# Patient Record
Sex: Male | Born: 1950 | Race: White | Hispanic: No | Marital: Married | Smoking: Never smoker
Health system: Southern US, Community
[De-identification: ages and names within clinical notes are randomized; demographics above are authoritative.]

## PROBLEM LIST (undated history)

## (undated) DIAGNOSIS — F410 Panic disorder [episodic paroxysmal anxiety] without agoraphobia: Secondary | ICD-10-CM

## (undated) DIAGNOSIS — Z950 Presence of cardiac pacemaker: Secondary | ICD-10-CM

## (undated) DIAGNOSIS — Z8719 Personal history of other diseases of the digestive system: Secondary | ICD-10-CM

## (undated) DIAGNOSIS — F329 Major depressive disorder, single episode, unspecified: Secondary | ICD-10-CM

## (undated) DIAGNOSIS — N393 Stress incontinence (female) (male): Secondary | ICD-10-CM

## (undated) DIAGNOSIS — C539 Malignant neoplasm of cervix uteri, unspecified: Secondary | ICD-10-CM

## (undated) DIAGNOSIS — I493 Ventricular premature depolarization: Secondary | ICD-10-CM

## (undated) DIAGNOSIS — I2510 Atherosclerotic heart disease of native coronary artery without angina pectoris: Secondary | ICD-10-CM

## (undated) DIAGNOSIS — I77810 Thoracic aortic ectasia: Secondary | ICD-10-CM

## (undated) DIAGNOSIS — J189 Pneumonia, unspecified organism: Secondary | ICD-10-CM

## (undated) DIAGNOSIS — E785 Hyperlipidemia, unspecified: Secondary | ICD-10-CM

## (undated) DIAGNOSIS — M1990 Unspecified osteoarthritis, unspecified site: Secondary | ICD-10-CM

## (undated) DIAGNOSIS — N529 Male erectile dysfunction, unspecified: Secondary | ICD-10-CM

## (undated) DIAGNOSIS — J309 Allergic rhinitis, unspecified: Secondary | ICD-10-CM

## (undated) DIAGNOSIS — Z9109 Other allergy status, other than to drugs and biological substances: Secondary | ICD-10-CM

## (undated) DIAGNOSIS — K219 Gastro-esophageal reflux disease without esophagitis: Secondary | ICD-10-CM

## (undated) DIAGNOSIS — K760 Fatty (change of) liver, not elsewhere classified: Secondary | ICD-10-CM

## (undated) DIAGNOSIS — I471 Supraventricular tachycardia: Secondary | ICD-10-CM

## (undated) DIAGNOSIS — N1330 Unspecified hydronephrosis: Secondary | ICD-10-CM

## (undated) DIAGNOSIS — C61 Malignant neoplasm of prostate: Secondary | ICD-10-CM

## (undated) DIAGNOSIS — I480 Paroxysmal atrial fibrillation: Secondary | ICD-10-CM

## (undated) DIAGNOSIS — I5032 Chronic diastolic (congestive) heart failure: Secondary | ICD-10-CM

## (undated) DIAGNOSIS — I10 Essential (primary) hypertension: Secondary | ICD-10-CM

## (undated) DIAGNOSIS — I4719 Other supraventricular tachycardia: Secondary | ICD-10-CM

## (undated) DIAGNOSIS — Z9889 Other specified postprocedural states: Secondary | ICD-10-CM

## (undated) DIAGNOSIS — F429 Obsessive-compulsive disorder, unspecified: Secondary | ICD-10-CM

## (undated) DIAGNOSIS — F32A Depression, unspecified: Secondary | ICD-10-CM

## (undated) DIAGNOSIS — E039 Hypothyroidism, unspecified: Secondary | ICD-10-CM

## (undated) DIAGNOSIS — R112 Nausea with vomiting, unspecified: Secondary | ICD-10-CM

## (undated) DIAGNOSIS — R001 Bradycardia, unspecified: Secondary | ICD-10-CM

## (undated) DIAGNOSIS — I700 Atherosclerosis of aorta: Secondary | ICD-10-CM

## (undated) HISTORY — DX: Paroxysmal atrial fibrillation: I48.0

## (undated) HISTORY — DX: Supraventricular tachycardia: I47.1

## (undated) HISTORY — PX: UPPER GI ENDOSCOPY: SHX6162

## (undated) HISTORY — DX: Depression, unspecified: F32.A

## (undated) HISTORY — DX: Chronic diastolic (congestive) heart failure: I50.32

## (undated) HISTORY — DX: Male erectile dysfunction, unspecified: N52.9

## (undated) HISTORY — DX: Thoracic aortic ectasia: I77.810

## (undated) HISTORY — DX: Presence of cardiac pacemaker: Z95.0

## (undated) HISTORY — DX: Atherosclerosis of aorta: I70.0

## (undated) HISTORY — DX: Bradycardia, unspecified: R00.1

## (undated) HISTORY — DX: Malignant neoplasm of prostate: C61

## (undated) HISTORY — DX: Panic disorder (episodic paroxysmal anxiety): F41.0

## (undated) HISTORY — DX: Allergic rhinitis, unspecified: J30.9

## (undated) HISTORY — DX: Ventricular premature depolarization: I49.3

## (undated) HISTORY — DX: Hypothyroidism, unspecified: E03.9

## (undated) HISTORY — DX: Gastro-esophageal reflux disease without esophagitis: K21.9

## (undated) HISTORY — DX: Major depressive disorder, single episode, unspecified: F32.9

## (undated) HISTORY — DX: Other supraventricular tachycardia: I47.19

---
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
## 1974-10-30 HISTORY — PX: THYROIDECTOMY, PARTIAL: SHX18

## 2002-05-31 HISTORY — PX: REFRACTIVE SURGERY: SHX103

## 2003-07-05 ENCOUNTER — Ambulatory Visit (HOSPITAL_COMMUNITY): Admission: RE | Admit: 2003-07-05 | Discharge: 2003-07-05 | Payer: Self-pay | Admitting: Gastroenterology

## 2004-02-05 ENCOUNTER — Emergency Department (HOSPITAL_COMMUNITY): Admission: EM | Admit: 2004-02-05 | Discharge: 2004-02-05 | Payer: Self-pay | Admitting: Emergency Medicine

## 2008-02-25 ENCOUNTER — Ambulatory Visit (HOSPITAL_COMMUNITY): Admission: EM | Admit: 2008-02-25 | Discharge: 2008-02-26 | Payer: Self-pay | Admitting: Emergency Medicine

## 2010-05-31 HISTORY — PX: ROBOT ASSISTED LAPAROSCOPIC RADICAL PROSTATECTOMY: SHX5141

## 2010-06-08 ENCOUNTER — Inpatient Hospital Stay (HOSPITAL_COMMUNITY)
Admission: RE | Admit: 2010-06-08 | Discharge: 2010-06-09 | Payer: Self-pay | Source: Home / Self Care | Attending: Urology | Admitting: Urology

## 2010-06-15 LAB — HEMOGLOBIN AND HEMATOCRIT, BLOOD
HCT: 42.7 % (ref 39.0–52.0)
HCT: 35.9 % — ABNORMAL LOW (ref 39.0–52.0)
Hemoglobin: 14.4 g/dL (ref 13.0–17.0)
Hemoglobin: 12.2 g/dL — ABNORMAL LOW (ref 13.0–17.0)

## 2010-06-15 LAB — TYPE AND SCREEN
ABO/RH(D): O NEG
Antibody Screen: NEGATIVE

## 2010-06-15 LAB — ABO/RH: ABO/RH(D): O NEG

## 2010-06-25 ENCOUNTER — Ambulatory Visit (HOSPITAL_COMMUNITY)
Admission: RE | Admit: 2010-06-25 | Discharge: 2010-06-25 | Payer: Self-pay | Source: Home / Self Care | Attending: Urology | Admitting: Urology

## 2010-06-29 ENCOUNTER — Ambulatory Visit
Admission: RE | Admit: 2010-06-29 | Discharge: 2010-06-30 | Payer: Self-pay | Source: Home / Self Care | Attending: Radiation Oncology | Admitting: Radiation Oncology

## 2010-07-01 ENCOUNTER — Ambulatory Visit: Payer: BC Managed Care – PPO | Admitting: Radiation Oncology

## 2010-08-10 LAB — CBC
HCT: 42.8 % (ref 39.0–52.0)
Hemoglobin: 14.3 g/dL (ref 13.0–17.0)
MCH: 30.4 pg (ref 26.0–34.0)
MCHC: 33.4 g/dL (ref 30.0–36.0)
MCV: 90.9 fL (ref 78.0–100.0)
Platelets: 212 10*3/uL (ref 150–400)
RBC: 4.71 MIL/uL (ref 4.22–5.81)
RDW: 13.6 % (ref 11.5–15.5)
WBC: 6.7 10*3/uL (ref 4.0–10.5)

## 2010-08-10 LAB — BASIC METABOLIC PANEL
BUN: 13 mg/dL (ref 6–23)
CO2: 27 mEq/L (ref 19–32)
Calcium: 9.2 mg/dL (ref 8.4–10.5)
Chloride: 103 mEq/L (ref 96–112)
Creatinine, Ser: 1.1 mg/dL (ref 0.4–1.5)
GFR calc Af Amer: >60 mL/min (ref >60)
GFR calc non Af Amer: >60 mL/min (ref >60)
Glucose, Bld: 92 mg/dL (ref 70–99)
Potassium: 4.6 mEq/L (ref 3.5–5.1)
Sodium: 140 mEq/L (ref 135–145)

## 2010-08-10 LAB — SURGICAL PCR SCREEN
MRSA, PCR: NEGATIVE
Staphylococcus aureus: NEGATIVE

## 2010-08-25 ENCOUNTER — Ambulatory Visit: Payer: BC Managed Care – PPO | Attending: Radiation Oncology | Admitting: Radiation Oncology

## 2010-08-25 DIAGNOSIS — Z510 Encounter for antineoplastic radiation therapy: Secondary | ICD-10-CM | POA: Insufficient documentation

## 2010-08-25 DIAGNOSIS — C61 Malignant neoplasm of prostate: Secondary | ICD-10-CM | POA: Insufficient documentation

## 2010-10-13 NOTE — Consult Note (Signed)
Blake Carter, Blake Carter            ACCOUNT NO.:  [id number]   MEDICAL RECORD NO.:  [id number]          PATIENT TYPE:  EMS   LOCATION:  ED                           FACILITY:  [facility]   PHYSICIAN:  James L. Malon Kindle., M.D.DATE OF BIRTH:  March 14, 1951   DATE OF CONSULTATION:  02/25/2008  DATE OF DISCHARGE:  02/26/2008                                 CONSULTATION   PRIMARY CARE PHYSICIAN:  Dr. Catha Gosselin.   REASON FOR CONSULTATION:  Food impaction.   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  Had eaten some chicken at 1 o'clock and has been unable to  swallow since.  He failed to respond to glucagon in the emergency room,  and we were asked to see him.  He does have a history of heartburn, is  taking a large number of p.r.n. antacids with some relief.  He has been  unable to swallow saliva and spitting up foamy saliva since 1 o'clock.   CURRENT MEDICATIONS:  Lipitor, gemfibrozil, Allegra, Lovenox,  lisinopril, aspirin.   ALLERGIES:  TAGAMET.   MEDICAL HISTORY:  The patient had a colonoscopy 5 years ago he notes was  okay.  He has a history of OCD.  No heart or lung disease.   SOCIAL HISTORY:  He does not smoke, uses alcohol occasionally.   PHYSICAL EXAMINATION:  VITAL SIGNS:  Unremarkable.  GENERAL:  Alert, quite talkative white male in no acute distress.  He is  quite muscular.  HEART:  Regular rate and rhythm without murmurs or gallops.  LUNGS:  Clear.  Throat is normal.  He does have a level III airway, I  can barely see his uvula.   ASSESSMENT:  Food impaction by history.   PLAN:  Proceed with an endoscopy and removal of a food impaction.  We  discussed the risk of aspiration, perforation, possible need to do to  surgery to have this removed.  If we fail, will remove it  endoscopically.  He asked questions, and these were answered.           ______________________________  Llana Aliment Malon Kindle., M.D.     Waldron Session  D:   02/25/2008  T:  02/26/2008  Job:  [id number]   cc:   Caryn Bee L. Little, M.D.  Fax: [phone]

## 2010-10-13 NOTE — Op Note (Signed)
Blake Carter, Blake Carter            ACCOUNT NO.:  [id number]   MEDICAL RECORD NO.:  [id number]          PATIENT TYPE:  EMS   LOCATION:  ED                           FACILITY:  [facility]   PHYSICIAN:  James L. Malon Kindle., M.D.DATE OF BIRTH:  11-17-1950   DATE OF PROCEDURE:  02/25/2008  DATE OF DISCHARGE:                               OPERATIVE REPORT   PROCEDURE:  Esophagogastroduodenoscopy with removal of food impaction.   MEDICATIONS:  Cetacaine spray, fentanyl 100 mcg, Versed 10 mg IV.   INDICATIONS:  Patient with food impaction with meat, specifically  chicken since 1 o'clock.   DESCRIPTION OF PROCEDURE:  The procedure had been explained to the  patient including the risks and benefits and consent obtained.  In the  left lateral decubitus position, the patient had a great deal of trouble  swallowing the scope.  We tried both direct and indirectly and we were  eventually able to pass it indirectly.  He had a large amount of gagging  and retching.  He had a large bolus of meat in the distal esophagus.  We  tried to get a Lucina Mellow net around it, were unsuccessful in doing, broke  into pieces.  We tried the tripod, we passed the scope approximately 7,  8 or 10 times pulling off pieces and breaking it up.  Finally we broke  it up and the remainder popped through into the stomach.  Complete  endoscopy was performed and the large piece of meat was seen in the  stomach.  The duodenum was normal as was the pyloric channel, antrum and  body of the stomach.  Fundus and cardia showed a hiatal hernia, showed a  stricture in the distal esophagus, was quite friable and was somewhat  bloody after all the instrumentation.  There were no obvious deep ulcers  or  tears.  The scope was withdrawn.  The esophagus was otherwise normal  and very few small pieces were seen in the esophagus.  The scope was  withdrawn.  The patient was alert and oriented and had somewhat of a  sore throat but no other significant  complaints.   ASSESSMENT:  1. Food impaction eventually removed.  2. Esophageal stricture.   PLAN:  Will start the patient on Nexium, will see him back in the office  in next several weeks.  Will keep him on clear liquids and ice chips  tonight.  He will call us if he has any further problems.           ______________________________  Llana Aliment Malon Kindle., M.D.     Waldron Session  D:  02/25/2008  T:  02/26/2008  Job:  [id number]   cc:   Caryn Bee L. Little, M.D.  Fax: [phone]

## 2010-10-16 NOTE — Op Note (Signed)
Blake Carter, Blake Carter                        ACCOUNT NO.:  [id number]   MEDICAL RECORD NO.:  [id number]                   PATIENT TYPE:  AMB   LOCATION:  XRAY                                 FACILITY:  [facility]   PHYSICIAN:  Graylin Shiver, M.D.                DATE OF BIRTH:  Nov 12, 1950   DATE OF PROCEDURE:  07/05/2003  DATE OF DISCHARGE:                                 OPERATIVE REPORT   PROCEDURE:  Colonoscopy.   INDICATIONS FOR PROCEDURE:  Screening.   Informed consent was obtained after explanation of the risks of bleeding,  infection and perforation.   PREMEDICATION:  Fentanyl 50 mcg IV, Versed 6 mg IV.   DESCRIPTION OF PROCEDURE:  With the patient in the left lateral decubitus  position, a rectal exam was performed and no masses were felt. The Olympus  colonoscope was then inserted into the rectum and advanced around the colon  to the cecum. Cecal landmarks were identified. The ileocecal valve was  intubated and the first few centimeters of terminal ileum looked normal.  The cecum was normal, the ascending colon were normal. The transverse colon  was normal.  The descending colon was normal. The sigmoid showed a few small  diverticula. The rectum was normal.  He tolerated the procedure well without  complications.   IMPRESSION:  A few diverticula in the sigmoid region otherwise normal  colonoscopy to the cecum.   I would recommend a followup screening colonoscopy again in 10 years.                                               Graylin Shiver, M.D.    [name]  D:  07/05/2003  T:  07/05/2003  Job:  [id number]   cc:   Caryn Bee L. Little, M.D.  [address]  [place]  [place] [address]  Fax: [phone]

## 2010-11-23 ENCOUNTER — Ambulatory Visit
Admission: RE | Admit: 2010-11-23 | Discharge: 2010-11-23 | Disposition: A | Discharge status: Home / Self Care | Payer: BC Managed Care – PPO | Source: Ambulatory Visit | Attending: Radiation Oncology | Admitting: Radiation Oncology

## 2010-11-23 DIAGNOSIS — C61 Malignant neoplasm of prostate: Secondary | ICD-10-CM | POA: Insufficient documentation

## 2010-11-23 DIAGNOSIS — Z510 Encounter for antineoplastic radiation therapy: Secondary | ICD-10-CM | POA: Insufficient documentation

## 2010-12-30 ENCOUNTER — Ambulatory Visit
Admission: RE | Admit: 2010-12-30 | Discharge: 2010-12-30 | Disposition: A | Discharge status: Home / Self Care | Payer: BC Managed Care – PPO | Source: Ambulatory Visit | Attending: Radiation Oncology | Admitting: Radiation Oncology

## 2011-10-08 ENCOUNTER — Other Ambulatory Visit: Payer: Self-pay | Admitting: Urology

## 2011-11-23 ENCOUNTER — Encounter (HOSPITAL_COMMUNITY): Payer: Self-pay

## 2011-11-23 ENCOUNTER — Encounter (HOSPITAL_COMMUNITY): Payer: Self-pay | Admitting: Pharmacy Technician

## 2011-11-23 ENCOUNTER — Encounter (HOSPITAL_COMMUNITY)
Admission: RE | Admit: 2011-11-23 | Discharge: 2011-11-23 | Disposition: A | Discharge status: Home / Self Care | Payer: BC Managed Care – PPO | Source: Ambulatory Visit | Attending: Urology | Admitting: Urology

## 2011-11-23 HISTORY — DX: Stress incontinence (female) (male): N39.3

## 2011-11-23 HISTORY — DX: Other specified postprocedural states: Z98.890

## 2011-11-23 HISTORY — DX: Obsessive-compulsive disorder, unspecified: F42.9

## 2011-11-23 HISTORY — DX: Other allergy status, other than to drugs and biological substances: Z91.09

## 2011-11-23 HISTORY — DX: Essential (primary) hypertension: I10

## 2011-11-23 HISTORY — DX: Hyperlipidemia, unspecified: E78.5

## 2011-11-23 HISTORY — DX: Other specified postprocedural states: R11.2

## 2011-11-23 LAB — CBC
HCT: 38.5 % — ABNORMAL LOW (ref 39.0–52.0)
Hemoglobin: 12.5 g/dL — ABNORMAL LOW (ref 13.0–17.0)
MCH: 28.7 pg (ref 26.0–34.0)
MCHC: 32.5 g/dL (ref 30.0–36.0)
MCV: 88.5 fL (ref 78.0–100.0)
Platelets: 234 10*3/uL (ref 150–400)
RBC: 4.35 MIL/uL (ref 4.22–5.81)
RDW: 14 % (ref 11.5–15.5)
WBC: 5 10*3/uL (ref 4.0–10.5)

## 2011-11-23 LAB — BASIC METABOLIC PANEL
BUN: 20 mg/dL (ref 6–23)
CO2: 29 mEq/L (ref 19–32)
Calcium: 9.8 mg/dL (ref 8.4–10.5)
Chloride: 100 mEq/L (ref 96–112)
Creatinine, Ser: 0.86 mg/dL (ref 0.50–1.35)
GFR calc Af Amer: >90 mL/min (ref >90)
GFR calc non Af Amer: >90 mL/min (ref >90)
Glucose, Bld: 124 mg/dL — ABNORMAL HIGH (ref 70–99)
Potassium: 4.6 mEq/L (ref 3.5–5.1)
Sodium: 138 mEq/L (ref 135–145)

## 2011-11-23 LAB — SURGICAL PCR SCREEN
MRSA, PCR: NEGATIVE
Staphylococcus aureus: NEGATIVE

## 2011-11-23 LAB — PROTIME-INR
INR: 0.91 (ref 0.00–1.49)
Prothrombin Time: 12.4 seconds (ref 11.6–15.2)

## 2011-11-23 LAB — APTT: aPTT: 28 seconds (ref 24–37)

## 2011-11-23 NOTE — Pre-Procedure Instructions (Signed)
PT HAS CXR REPORT FROM DR. K. LITTLE --DONE 11/11/11--REPORT IS ON PT'S CHART. CBC, BMET, PT, PTT AND EKG WERE DONE TODAY - PREOP - AT [facility] AS PER ORDERS DR. MACDIARMID AND ANESTHESIOLOGIST'S GUIDELINES. PREOP INSTRUCTIONS WERE DISCUSSED WITH PT USING TEACH BACK METHOD.

## 2011-11-23 NOTE — Patient Instructions (Signed)
YOUR SURGERY IS SCHEDULED ON:  Tuesday  7/2  AT 7:30 AM  REPORT TO [facility] AT:  5:30 AM      PHONE # FOR SHORT STAY IS [phone]  DO NOT EAT OR DRINK ANYTHING AFTER MIDNIGHT THE NIGHT BEFORE YOUR SURGERY.  YOU MAY BRUSH YOUR TEETH, RINSE OUT YOUR MOUTH--BUT NO WATER, NO FOOD, NO CHEWING GUM, NO MINTS, NO CANDIES, NO CHEWING TOBACCO.  PLEASE TAKE THE FOLLOWING MEDICATIONS THE AM OF YOUR SURGERY WITH A FEW SIPS OF WATER:  AMLODIPINE, LIPITOR, NEXIUM, FEXOFENADINE.  PLEASE USE YOUR EYE DROPS AND BRING YOUR DROPS TO THE HOSPITAL.    IF YOU USE INHALERS--USE YOUR INHALERS THE AM OF YOUR SURGERY AND BRING INHALERS TO THE HOSPITAL -TAKE TO SURGERY.    IF YOU ARE DIABETIC:  DO NOT TAKE ANY DIABETIC MEDICATIONS THE AM OF YOUR SURGERY.  IF YOU TAKE INSULIN IN THE EVENINGS--PLEASE ONLY TAKE 1/2 NORMAL EVENING DOSE THE NIGHT BEFORE YOUR SURGERY.  NO INSULIN THE AM OF YOUR SURGERY.  IF YOU HAVE SLEEP APNEA AND USE CPAP OR BIPAP--PLEASE BRING THE MASK --NOT THE MACHINE-NOT THE TUBING   -JUST THE MASK. DO NOT BRING VALUABLES, MONEY, CREDIT CARDS.  CONTACT LENS, DENTURES / PARTIALS, GLASSES SHOULD NOT BE WORN TO SURGERY AND IN MOST CASES-HEARING AIDS WILL NEED TO BE REMOVED.  BRING YOUR GLASSES CASE, ANY EQUIPMENT NEEDED FOR YOUR CONTACT LENS. FOR PATIENTS ADMITTED TO THE HOSPITAL--CHECK OUT TIME THE DAY OF DISCHARGE IS 11:00 AM.  ALL INPATIENT ROOMS ARE PRIVATE - WITH BATHROOM, TELEPHONE, TELEVISION AND WIFI INTERNET. IF YOU ARE BEING DISCHARGED THE SAME DAY OF YOUR SURGERY--YOU CAN NOT DRIVE YOURSELF HOME--AND SHOULD NOT GO HOME ALONE BY TAXI OR BUS.  NO DRIVING OR OPERATING MACHINERY FOR 24 HOURS FOLLOWING ANESTHESIA / PAIN MEDICATIONS.                            SPECIAL INSTRUCTIONS:  CHLORHEXIDINE SOAP SHOWER (other brand names are Betasept and Hibiclens ) PLEASE SHOWER WITH CHLORHEXIDINE THE NIGHT BEFORE YOUR SURGERY AND THE AM OF YOUR SURGERY. DO NOT USE CHLORHEXIDINE ON YOUR  FACE OR PRIVATE AREAS--YOU MAY USE YOUR NORMAL SOAP THOSE AREAS AND YOUR NORMAL SHAMPOO.  WOMEN SHOULD AVOID SHAVING UNDER ARMS AND SHAVING LEGS 48 HOURS BEFORE USING CHLORHEXIDINE TO AVOID SKIN IRRITATION.  DO NOT USE IF ALLERGIC TO CHLORHEXIDINE.  PLEASE READ OVER ANY  FACT SHEETS THAT YOU WERE GIVEN: MRSA INFORMATION, BLOOD TRANSFUSION INFORMATION

## 2011-11-24 MED ORDER — MIDAZOLAM HCL 10 MG/2ML IJ SOLN
INTRAMUSCULAR | Status: AC
  Filled 2011-11-24: qty 2

## 2011-11-24 MED ORDER — FENTANYL CITRATE 0.05 MG/ML IJ SOLN
INTRAMUSCULAR | Status: AC
  Filled 2011-11-24: qty 2

## 2011-11-24 MED ORDER — DIPHENHYDRAMINE HCL 50 MG/ML IJ SOLN
INTRAMUSCULAR | Status: AC
  Filled 2011-11-24: qty 1

## 2011-11-29 MED ORDER — GENTAMICIN SULFATE 40 MG/ML IJ SOLN
440.0000 mg | INTRAVENOUS | Status: AC
  Administered 2011-11-30: 440 mg via INTRAVENOUS
  Filled 2011-11-29: qty 11

## 2011-11-29 NOTE — H&P (Signed)
History of Present Illness   [name] has ongoing stress incontinence. He has urge incontinence. 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 Blake Carter helped urgency but not his incontinence. VESIcare failed. He is here to talk about his sphincter.  Review of systems: No change in bowel or neurologic status.   He has stable prostate cancer followed by Dr. Laverle Carter.  Urinalysis: I reviewed, negative.   We talked about an artificial sphincter and I drew him a picture. I mentioned a male sling.  I drew him a picture and we talked about an artificial sphincter in detail. Pros, cons, general surgical and anesthetic risks, and other options including behavioral therapy, the male sling, and watchful waiting were discussed. He understands that sphincters are successful in 80-90% of cases for stress incontinence (dry in approximately half), 50% for urge incontinence, and that in a small percentage of cases the incontinence can worsen. Surgical risks were described but not limited to the discussion of injury to neighboring structures including the bowel (with possible life-threatening sepsis and colostomy), and bladder, urethra (all resulting in further surgery). The risk of urethral erosion and infection were discussed with sequelae. The risks of malfunction, atrophy, and hernia/skin erosion and management were discussed. Bleeding risks with transfusion rates and risks of perineal numbness and erectile dysfunction were discussed. The risks of urinary retention requiring catheterization and slowing of urinary stream were discussed. We talked about injury to nerves/soft tissue leading to debilitating and intractable pelvic, abdominal, and lower extremity pain syndromes and neuropathies. The usual post-operative course and time of activation was described. The patient understands that he might not reach his treatment goal and that he might be worse following surgery.    Past Medical History Problems    1. History of  Anxiety (Symptom) 300.00 2. History of  Esophageal Reflux 530.81 3. History of  Hyperlipidemia 272.4 4. History of  Hypertension 401.9 5. 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 Prostate Cancer 185  Surgical History Problems  1. History of  Prostatectomy Robotic-Assisted 2. History of  Thyroid Surgery 3. History of  Tonsillectomy  Current Meds 1. Alphagan P 0.1 % Ophthalmic Solution; Therapy: 20Mar2013 to 2. AmLODIPine Besylate 5 MG Oral Tablet; Therapy: (Recorded:18Oct2011) to 3. Aspirin 81 MG Oral Tablet; Therapy: (Recorded:18Oct2011) to 4. CoQ-10 10 MG Oral Capsule; Therapy: (Recorded:20Feb2013) to 5. Fexofenadine HCl 180 MG Oral Tablet; Therapy: (Recorded:18Oct2011) to 6. Fish Oil CAPS; Therapy: (Recorded:18Oct2011) to 7. Fluticasone Propionate 50 MCG/ACT Nasal Suspension; Therapy: 31Aug2012 to 8. FluvoxaMINE Maleate 50 MG Oral Tablet; Therapy: (Recorded:18Oct2011) to 9. Gemfibrozil 600 MG Oral Tablet; Therapy: (Recorded:18Oct2011) to 10. Lipitor 20 MG Oral Tablet; Therapy: (Recorded:18Oct2011) to 11. Lisinopril 20 MG Oral Tablet; Therapy: (Recorded:18Oct2011) to 12. Multi-Vitamin TABS; Therapy: (Recorded:18Oct2011) to 13. NexIUM 40 MG Oral Capsule Delayed Release; Therapy: (Recorded:18Oct2011) to 14. Selenium CAPS; Therapy: (Recorded:28Sep2012) to 15. Toviaz 8 MG Oral Tablet Extended Release 24 Hour; Take one (1) tablet(s) by mouth once aday;   Therapy: 04Mar2013 to (Evaluate:27Feb2014); Last Rx:04Mar2013 16. VESIcare 5 MG Oral Tablet; a tablet daily for 30 days; Therapy: 04Mar2013 to (Last Rx:04Mar2013) 17. Vitamin C TABS; Therapy: (Recorded:28Sep2012) to 18. Zinc TABS; Therapy: (Recorded:28Sep2012) to 19. Zolpidem Tartrate 10 MG Oral Tablet; Therapy: 08Dec2011 to  Allergies Medication  1. Tagamet TABS  Family History Problems  1. Paternal history of  Acute Myocardial Infarction V17.3 2. Maternal history of  Brain Cancer  V16.8 3. Paternal history of  Lung Cancer V16.1 Denied  4. Family history of  Prostate Cancer  Social History Problems  1. Activities Of Daily Living 2. Alcohol Use occ social use avg once a mth 3. Exercise Habits He is exercising regularly at least 5 days a week 3 times a week for resistive training and at least 5-6 times a week for cardio. 4. Living Independently With Spouse 5. Marital History - Currently Married 6. Never A Smoker 7. Occupation: professor 8. Self-reliant In Usual Daily Activities Denied  9. History of  Tobacco Use  Assessment Assessed  1. Male Stress Incontinence 788.32 2. Prostate Cancer 185  Plan Male Stress Incontinence (788.32)  1. 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 Ciprofloxacin HCl 250 MG Oral Tablet; TAKE 2 TABLET Daily; Therapy: 09May2013 to  (Evaluate:19May2013); Last Rx:09May2013  Discussion/Summary   [name] would like to proceed with an artificial sphincter and I think it is an excellent choice for him. Based upon the severity, I did not recommend a male sling. He will get a urine culture approximately one week prior to surgery and I gave him Cipro for 10 days to start 3 days prior to surgery.  After a thorough review of the management options for the patient's condition the patient  elected to proceed with surgical therapy as noted above. We have discussed the potential benefits and risks of the procedure, side effects of the proposed treatment, the likelihood of the patient achieving the goals of the procedure, and any potential problems that might occur during the procedure or recuperation. Informed consent has been obtained.

## 2011-11-30 ENCOUNTER — Encounter (HOSPITAL_COMMUNITY): Payer: Self-pay | Admitting: *Deleted

## 2011-11-30 ENCOUNTER — Ambulatory Visit (HOSPITAL_COMMUNITY): Payer: BC Managed Care – PPO | Admitting: Anesthesiology

## 2011-11-30 ENCOUNTER — Ambulatory Visit (HOSPITAL_COMMUNITY)
Admission: RE | Admit: 2011-11-30 | Discharge: 2011-12-01 | Disposition: A | Discharge status: Home / Self Care | Payer: BC Managed Care – PPO | Source: Ambulatory Visit | Attending: Urology | Admitting: Urology

## 2011-11-30 ENCOUNTER — Encounter (HOSPITAL_COMMUNITY): Payer: Self-pay | Admitting: Anesthesiology

## 2011-11-30 ENCOUNTER — Encounter (HOSPITAL_COMMUNITY)
Admission: RE | Disposition: A | Discharge status: Home / Self Care | Payer: Self-pay | Source: Ambulatory Visit | Attending: Urology

## 2011-11-30 DIAGNOSIS — C61 Malignant neoplasm of prostate: Secondary | ICD-10-CM | POA: Insufficient documentation

## 2011-11-30 DIAGNOSIS — I10 Essential (primary) hypertension: Secondary | ICD-10-CM | POA: Insufficient documentation

## 2011-11-30 DIAGNOSIS — Z01810 Encounter for preprocedural cardiovascular examination: Secondary | ICD-10-CM | POA: Insufficient documentation

## 2011-11-30 DIAGNOSIS — E785 Hyperlipidemia, unspecified: Secondary | ICD-10-CM | POA: Insufficient documentation

## 2011-11-30 DIAGNOSIS — Z01812 Encounter for preprocedural laboratory examination: Secondary | ICD-10-CM | POA: Insufficient documentation

## 2011-11-30 DIAGNOSIS — N393 Stress incontinence (female) (male): Secondary | ICD-10-CM | POA: Insufficient documentation

## 2011-11-30 DIAGNOSIS — K219 Gastro-esophageal reflux disease without esophagitis: Secondary | ICD-10-CM | POA: Insufficient documentation

## 2011-11-30 DIAGNOSIS — Z79899 Other long term (current) drug therapy: Secondary | ICD-10-CM | POA: Insufficient documentation

## 2011-11-30 HISTORY — PX: CYSTOSCOPY: SHX5120

## 2011-11-30 HISTORY — PX: URINARY SPHINCTER IMPLANT: SHX2624

## 2011-11-30 LAB — HEMOGLOBIN AND HEMATOCRIT, BLOOD
HCT: 34.7 % — ABNORMAL LOW (ref 39.0–52.0)
Hemoglobin: 11.5 g/dL — ABNORMAL LOW (ref 13.0–17.0)

## 2011-11-30 LAB — TYPE AND SCREEN
ABO/RH(D): O NEG
Antibody Screen: NEGATIVE

## 2011-11-30 SURGERY — CYSTOSCOPY
Anesthesia: General | Site: Perineum | Wound class: Clean Contaminated

## 2011-11-30 MED ORDER — FENTANYL CITRATE 0.05 MG/ML IJ SOLN
INTRAMUSCULAR | Status: DC | PRN
  Administered 2011-11-30: 50 ug via INTRAVENOUS
  Administered 2011-11-30 (×3): 25 ug via INTRAVENOUS
  Administered 2011-11-30: 50 ug via INTRAVENOUS
  Administered 2011-11-30 (×3): 25 ug via INTRAVENOUS

## 2011-11-30 MED ORDER — FENTANYL CITRATE 0.05 MG/ML IJ SOLN
INTRAMUSCULAR | Status: AC
  Filled 2011-11-30: qty 2

## 2011-11-30 MED ORDER — DEXTROSE-NACL 5-0.45 % IV SOLN
INTRAVENOUS | Status: DC
  Administered 2011-11-30 (×2): via INTRAVENOUS

## 2011-11-30 MED ORDER — MORPHINE SULFATE 2 MG/ML IJ SOLN
2.0000 mg | INTRAMUSCULAR | Status: DC | PRN
  Administered 2011-11-30 – 2011-12-01 (×2): 2 mg via INTRAVENOUS
  Filled 2011-11-30 (×2): qty 1

## 2011-11-30 MED ORDER — ONDANSETRON HCL 4 MG/2ML IJ SOLN
INTRAMUSCULAR | Status: DC | PRN
  Administered 2011-11-30: 4 mg via INTRAVENOUS

## 2011-11-30 MED ORDER — SULFAMETHOXAZOLE-TRIMETHOPRIM 800-160 MG PO TABS: 1.0000 | ORAL_TABLET | Freq: Two times a day (BID) | ORAL | Status: AC

## 2011-11-30 MED ORDER — ROCURONIUM BROMIDE 100 MG/10ML IV SOLN
INTRAVENOUS | Status: DC | PRN
  Administered 2011-11-30: 5 mg via INTRAVENOUS
  Administered 2011-11-30: 25 mg via INTRAVENOUS
  Administered 2011-11-30 (×2): 10 mg via INTRAVENOUS
  Administered 2011-11-30: 15 mg via INTRAVENOUS
  Administered 2011-11-30: 5 mg via INTRAVENOUS

## 2011-11-30 MED ORDER — STERILE WATER FOR IRRIGATION IR SOLN
Status: DC | PRN
  Administered 2011-11-30: 1000 mL

## 2011-11-30 MED ORDER — BELLADONNA ALKALOIDS-OPIUM 16.2-60 MG RE SUPP
RECTAL | Status: AC
  Filled 2011-11-30: qty 1

## 2011-11-30 MED ORDER — SODIUM CHLORIDE 0.9 % IR SOLN
Status: DC | PRN
  Administered 2011-11-30: 08:00:00

## 2011-11-30 MED ORDER — ACETAMINOPHEN 10 MG/ML IV SOLN
INTRAVENOUS | Status: DC | PRN
  Administered 2011-11-30: 1000 mg via INTRAVENOUS

## 2011-11-30 MED ORDER — LACTATED RINGERS IV SOLN
INTRAVENOUS | Status: DC | PRN
  Administered 2011-11-30 (×2): via INTRAVENOUS

## 2011-11-30 MED ORDER — GLYCOPYRROLATE 0.2 MG/ML IJ SOLN
INTRAMUSCULAR | Status: DC | PRN
  Administered 2011-11-30: .7 mg via INTRAVENOUS

## 2011-11-30 MED ORDER — HYDROCODONE-ACETAMINOPHEN 5-325 MG PO TABS
1.0000 | ORAL_TABLET | ORAL | Status: DC | PRN
  Administered 2011-11-30 (×2): 2 via ORAL
  Filled 2011-11-30 (×3): qty 2

## 2011-11-30 MED ORDER — ATORVASTATIN CALCIUM 20 MG PO TABS
20.0000 mg | ORAL_TABLET | Freq: Every day | ORAL | Status: DC
  Filled 2011-11-30 (×2): qty 1

## 2011-11-30 MED ORDER — HYDROCODONE-ACETAMINOPHEN 5-325 MG PO TABS
1.0000 | ORAL_TABLET | Freq: Four times a day (QID) | ORAL | Status: AC | PRN
Start: 2011-11-30 — End: 2011-12-10

## 2011-11-30 MED ORDER — MIDAZOLAM HCL 5 MG/5ML IJ SOLN
INTRAMUSCULAR | Status: DC | PRN
  Administered 2011-11-30: 2 mg via INTRAVENOUS

## 2011-11-30 MED ORDER — PANTOPRAZOLE SODIUM 40 MG PO TBEC
80.0000 mg | DELAYED_RELEASE_TABLET | Freq: Every day | ORAL | Status: DC
  Filled 2011-11-30: qty 2

## 2011-11-30 MED ORDER — EPHEDRINE SULFATE 50 MG/ML IJ SOLN
INTRAMUSCULAR | Status: DC | PRN
  Administered 2011-11-30 (×2): 10 mg via INTRAVENOUS

## 2011-11-30 MED ORDER — LIDOCAINE HCL (CARDIAC) 20 MG/ML IV SOLN
INTRAVENOUS | Status: DC | PRN
  Administered 2011-11-30: 50 mg via INTRAVENOUS

## 2011-11-30 MED ORDER — ACETAMINOPHEN 10 MG/ML IV SOLN
INTRAVENOUS | Status: AC
  Filled 2011-11-30: qty 100

## 2011-11-30 MED ORDER — BRIMONIDINE TARTRATE 0.1 % OP SOLN
1.0000 [drp] | Freq: Two times a day (BID) | OPHTHALMIC | Status: DC
  Administered 2011-11-30: 1 [drp] via OPHTHALMIC

## 2011-11-30 MED ORDER — FLUVOXAMINE MALEATE 100 MG PO TABS
200.0000 mg | ORAL_TABLET | Freq: Every day | ORAL | Status: DC
  Administered 2011-11-30 – 2011-12-01 (×2): 200 mg via ORAL
  Filled 2011-11-30 (×2): qty 2

## 2011-11-30 MED ORDER — PROMETHAZINE HCL 25 MG/ML IJ SOLN: 6.2500 mg | INTRAMUSCULAR | Status: DC | PRN

## 2011-11-30 MED ORDER — ACETAMINOPHEN 10 MG/ML IV SOLN
1000.0000 mg | Freq: Four times a day (QID) | INTRAVENOUS | Status: DC
  Administered 2011-11-30 – 2011-12-01 (×3): 1000 mg via INTRAVENOUS
  Filled 2011-11-30 (×6): qty 100

## 2011-11-30 MED ORDER — LACTATED RINGERS IV SOLN: INTRAVENOUS | Status: DC

## 2011-11-30 MED ORDER — DEXTROSE 5 % IV SOLN
3.0000 g | Freq: Once | INTRAVENOUS | Status: AC
  Administered 2011-11-30: 3 g via INTRAVENOUS
  Filled 2011-11-30: qty 30

## 2011-11-30 MED ORDER — LIDOCAINE HCL 2 % EX GEL
CUTANEOUS | Status: AC
  Filled 2011-11-30: qty 10

## 2011-11-30 MED ORDER — GEMFIBROZIL 600 MG PO TABS
600.0000 mg | ORAL_TABLET | Freq: Two times a day (BID) | ORAL | Status: DC
Start: 2011-11-30 — End: 2011-12-01
  Administered 2011-11-30 – 2011-12-01 (×2): 600 mg via ORAL
  Filled 2011-11-30 (×4): qty 1

## 2011-11-30 MED ORDER — LISINOPRIL 20 MG PO TABS
20.0000 mg | ORAL_TABLET | Freq: Every morning | ORAL | Status: DC
  Administered 2011-11-30 – 2011-12-01 (×2): 20 mg via ORAL
  Filled 2011-11-30 (×2): qty 1

## 2011-11-30 MED ORDER — AMLODIPINE BESYLATE 5 MG PO TABS
5.0000 mg | ORAL_TABLET | Freq: Every morning | ORAL | Status: DC
  Administered 2011-12-01: 5 mg via ORAL
  Filled 2011-11-30 (×2): qty 1

## 2011-11-30 MED ORDER — ZOLPIDEM TARTRATE 10 MG PO TABS
10.0000 mg | ORAL_TABLET | Freq: Every evening | ORAL | Status: DC | PRN
  Administered 2011-11-30: 10 mg via ORAL
  Filled 2011-11-30: qty 1

## 2011-11-30 MED ORDER — PROPOFOL 10 MG/ML IV BOLUS
INTRAVENOUS | Status: DC | PRN
  Administered 2011-11-30: 200 mg via INTRAVENOUS

## 2011-11-30 MED ORDER — FLUVOXAMINE MALEATE 100 MG PO TABS
200.0000 mg | ORAL_TABLET | Freq: Every day | ORAL | Status: DC
  Filled 2011-11-30: qty 2

## 2011-11-30 MED ORDER — FENTANYL CITRATE 0.05 MG/ML IJ SOLN
25.0000 ug | INTRAMUSCULAR | Status: DC | PRN
  Administered 2011-11-30 (×3): 50 ug via INTRAVENOUS

## 2011-11-30 MED ORDER — NEOSTIGMINE METHYLSULFATE 1 MG/ML IJ SOLN
INTRAMUSCULAR | Status: DC | PRN
  Administered 2011-11-30: 4 mg via INTRAVENOUS

## 2011-11-30 MED ORDER — INDIGOTINDISULFONATE SODIUM 8 MG/ML IJ SOLN
INTRAMUSCULAR | Status: AC
  Filled 2011-11-30: qty 5

## 2011-11-30 SURGICAL SUPPLY — 55 items
BAG URINE DRAINAGE (UROLOGICAL SUPPLIES) ×3 IMPLANT
BAG URO CATCHER STRL LF (DRAPE) ×3 IMPLANT
BALLOON PRESSURE REGUL 61 70CM (Miscellaneous) ×2 IMPLANT
BANDAGE GAUZE ELAST BULKY 4 IN (GAUZE/BANDAGES/DRESSINGS) ×3 IMPLANT
BLADE SURG 15 STRL LF DISP TIS (BLADE) ×4 IMPLANT
BLADE SURG 15 STRL SS (BLADE) ×2
BRIEF STRETCH FOR OB PAD LRG (UNDERPADS AND DIAPERS) ×3 IMPLANT
CANISTER SUCTION 2500CC (MISCELLANEOUS) ×3 IMPLANT
CATH FOLEY 2WAY SLVR  5CC 14FR (CATHETERS) ×1
CATH FOLEY 2WAY SLVR 5CC 14FR (CATHETERS) ×2 IMPLANT
CLOTH BEACON ORANGE TIMEOUT ST (SAFETY) ×3 IMPLANT
CONTROL PUMP URINARY 800 (Pump) ×3 IMPLANT
COVER MAYO STAND STRL (DRAPES) ×6 IMPLANT
COVER SURGICAL LIGHT HANDLE (MISCELLANEOUS) ×3 IMPLANT
CUFF URINARY OCCL 4. IZ (Miscellaneous) ×3 IMPLANT
DERMABOND ADVANCED (GAUZE/BANDAGES/DRESSINGS) ×2
DERMABOND ADVANCED .7 DNX12 (GAUZE/BANDAGES/DRESSINGS) ×4 IMPLANT
DISSECTOR ROUND CHERRY 3/8 STR (MISCELLANEOUS) ×3 IMPLANT
DRAPE CAMERA CLOSED 9X96 (DRAPES) ×3 IMPLANT
DRAPE LG THREE QUARTER DISP (DRAPES) ×3 IMPLANT
DRESSING TELFA 8X3 (GAUZE/BANDAGES/DRESSINGS) ×3 IMPLANT
ELECT REM PT RETURN 9FT ADLT (ELECTROSURGICAL) ×3
ELECTRODE REM PT RTRN 9FT ADLT (ELECTROSURGICAL) ×2 IMPLANT
GAUZE SPONGE 4X4 16PLY XRAY LF (GAUZE/BANDAGES/DRESSINGS) ×6 IMPLANT
GLOVE BIOGEL M STRL SZ7.5 (GLOVE) ×3 IMPLANT
GOWN PREVENTION PLUS XLARGE (GOWN DISPOSABLE) ×3 IMPLANT
GOWN STRL REIN XL XLG (GOWN DISPOSABLE) ×3 IMPLANT
KIT ACCESSORY AMS 800 ×3 IMPLANT
KIT BASIN OR (CUSTOM PROCEDURE TRAY) ×3 IMPLANT
LOOP VESSEL MAXI BLUE (MISCELLANEOUS) ×3 IMPLANT
LUBRICANT JELLY K Y 4OZ (MISCELLANEOUS) ×3 IMPLANT
NEEDLE INJECT RIGID (NEEDLE) IMPLANT
NS IRRIG 1000ML POUR BTL (IV SOLUTION) ×3 IMPLANT
PACK BASIC VI WITH GOWN DISP (CUSTOM PROCEDURE TRAY) IMPLANT
PACK CYSTO (CUSTOM PROCEDURE TRAY) ×3 IMPLANT
PENCIL BUTTON HOLSTER BLD 10FT (ELECTRODE) ×3 IMPLANT
PLUG CATH AND CAP STER (CATHETERS) ×3 IMPLANT
PRESS REG BALL 61 70CM (Miscellaneous) ×3 IMPLANT
SET CYSTO W/LG BORE CLAMP LF (SET/KITS/TRAYS/PACK) IMPLANT
SHEET LAVH (DRAPES) ×3 IMPLANT
SUT CHROMIC 3 0 SH 27 (SUTURE) IMPLANT
SUT MNCRL AB 4-0 PS2 18 (SUTURE) ×6 IMPLANT
SUT SILK 0 FSL (SUTURE) ×6 IMPLANT
SUT VIC AB 0 CT1 27 (SUTURE) ×1
SUT VIC AB 0 CT1 27XBRD ANTBC (SUTURE) ×2 IMPLANT
SUT VIC AB 3-0 SH 27 (SUTURE) ×5
SUT VIC AB 3-0 SH 27X BRD (SUTURE) ×10 IMPLANT
SUT VIC AB 4-0 PS2 27 (SUTURE) ×9 IMPLANT
SYR 30ML LL (SYRINGE) ×3 IMPLANT
SYR BULB IRRIGATION 50ML (SYRINGE) ×3 IMPLANT
SYRINGE 10CC LL (SYRINGE) ×6 IMPLANT
TOWEL OR 17X26 10 PK STRL BLUE (TOWEL DISPOSABLE) ×6 IMPLANT
TUBING CONNECTING 10 (TUBING) ×3 IMPLANT
WATER STERILE IRR 1500ML POUR (IV SOLUTION) ×3 IMPLANT
YANKAUER SUCT BULB TIP 10FT TU (MISCELLANEOUS) ×3 IMPLANT

## 2011-11-30 NOTE — Progress Notes (Signed)
Patient ambulated >100 feet. Patient tolerated well. Will continue to monitor patient. Setzer, Don Broach

## 2011-11-30 NOTE — Anesthesia Preprocedure Evaluation (Addendum)
Anesthesia Evaluation  Patient identified by MRN, date of birth, ID band Patient awake    Reviewed: Allergy & Precautions, H&P , NPO status , Patient's Chart, lab work & pertinent test results  History of Anesthesia Complications (+) PONV  Airway       Dental   Pulmonary sleep apnea ,          Cardiovascular Exercise Tolerance: Good hypertension, Pt. on medications  Stress test JUne 2011 OK.   Neuro/Psych PSYCHIATRIC DISORDERS OCD personality disorder.negative neurological ROS     GI/Hepatic negative GI ROS, Neg liver ROS,   Endo/Other  negative endocrine ROS  Renal/GU negative Renal ROS  negative genitourinary   Musculoskeletal negative musculoskeletal ROS (+)   Abdominal (+) + obese,   Peds negative pediatric ROS (+)  Hematology negative hematology ROS (+)   Anesthesia Other Findings   Reproductive/Obstetrics negative OB ROS                         Anesthesia Physical Anesthesia Plan  ASA: III  Anesthesia Plan: General   Post-op Pain Management:    Induction: Intravenous  Airway Management Planned: LMA  Additional Equipment:   Intra-op Plan:   Post-operative Plan: Extubation in OR  Informed Consent: I have reviewed the patients History and Physical, chart, labs and discussed the procedure including the risks, benefits and alternatives for the proposed anesthesia with the patient or authorized representative who has indicated his/her understanding and acceptance.   Dental advisory given  Plan Discussed with: CRNA  Anesthesia Plan Comments:         Anesthesia Quick Evaluation

## 2011-11-30 NOTE — Op Note (Signed)
Preoperative diagnosis: Stress urinary continence Postop diagnosis: Stress urinary incontinence Surgery: Implantation of artificial urinary sphincter Surgeon: Dr. Lorin Picket [name] Assistant: Pecola Leisure  The patient has the above diagnoses. He's had radiation. He consented above procedure. Extra care was taken with leg positioning to minimize the risk of compartment syndrome neuropathy and DVT. He had a very short perineum and smaller scrotum. He was moderately obese.  10 minute preparation and scrub was utilized. I draped the rectum in the usual fashion and using my regular draped.  I marked the perineum and made approximate 5-6 cm perineal incision and dissected down to subcutaneous fat to the bulbospongiosus muscle in the midline. I had to use a deep cerebellar retractor. He was approximately 2 inches deep to reach the muscle. A larger Richardson retractor was also utilized. 14 French catheter was utilized  I sharply split a thin and atrophic bulbospongiosus muscle and mobilized the bulbar urethra at the bifurcation of the corporal bodies. I used my box technique the further mobilized the bulbar and then the use a small right angle at the level of corporal bodies to perforate the tip of the sail of tissue using Bovie at that level. I passed a small vesi-loop and made a hole the size of the tip of my index finger at the bifurcation. Using the measuring device he measured 4 cm.  I brought the patient's right leg down minimally and marked out all landmarks and in its right lower quadrant skin fold made a 6 cm incision and dissected down to a lot of subcutaneous tissue exposing the external oblique aponeurosis which was split sharply for approximately 4-5 cm. Staying lateral and with good retraction I passed a large Kelly separating the external oblique and muscles down to the level of the transversalis fascia that was sharply cut approximately 2 mm. Pre-peritoneal fat was easily identified. A  prepared sterile 61-70 cm pressure balloon was placed in the extra peritoneal space. Using the usual technique I instilled 25 cc and normal saline. With a well situated reservoir I closed the fascia with running 0 Vicryl on a CT1 needle. I finger dissected to the upper aspect of the right hemiscrotum at the level of the fascia.  I then directed attention to the bulbar urethra and easily placed a 4 cm cuff not under tension with the tubing exiting the right side. He laid in very nicely in good position. I cut the have as per protocol. I delivered the tubing with the needle passer up through the abdominal incision deep to the bulbospongiosus muscle and the patient's right  Utilizing quick connectors I connected all tubing which laid in nicely deep to the subcutaneous tissue. I cycled the pump 3 times and visually and palpably cycled well. I left a moderate dimple  All incisions were irrigated with copious saline and antibiotic solution. I closed the perineal incision with 3 layers of 3-0 Vicryl followed by 4-0 subcuticular. I closed the right lower quadrant incision 3-0 Vicryl and 4-0 subcuticular. My usual sterile dressings were applied. Blood loss was less than 30 mL. Lead position was good at in the case. Loading dose of antibiotics were given prior to incision

## 2011-11-30 NOTE — Anesthesia Postprocedure Evaluation (Signed)
  Anesthesia Post-op Note  Patient: Blake Carter  Procedure(s) Performed: Procedure(s) (LRB): CYSTOSCOPY (N/A) ARTIFICIAL URINARY SPHINCTER (N/A)  Patient Location: PACU  Anesthesia Type: General  Level of Consciousness: awake and alert   Airway and Oxygen Therapy: Patient Spontanous Breathing  Post-op Pain: mild  Post-op Assessment: Post-op Vital signs reviewed, Patient's Cardiovascular Status Stable, Respiratory Function Stable, Patent Airway and No signs of Nausea or vomiting  Post-op Vital Signs: stable  Complications: No apparent anesthesia complications

## 2011-11-30 NOTE — Transfer of Care (Signed)
Immediate Anesthesia Transfer of Care Note  Patient: Blake Carter  Procedure(s) Performed: Procedure(s) (LRB): CYSTOSCOPY (N/A) ARTIFICIAL URINARY SPHINCTER (N/A)  Patient Location: PACU  Anesthesia Type: General  Level of Consciousness: sedated, patient cooperative and responds to stimulaton  Airway & Oxygen Therapy: Patient Spontanous Breathing and Patient connected to face mask oxgen  Post-op Assessment: Report given to PACU RN and Post -op Vital signs reviewed and stable  Post vital signs: Reviewed and stable  Complications: No apparent anesthesia complications

## 2011-11-30 NOTE — Interval H&P Note (Signed)
History and Physical Interval Note:  11/30/2011 9:53 AM  Blake Carter  has presented today for surgery, with the diagnosis of stress incontinence  The various methods of treatment have been discussed with the patient and family. After consideration of risks, benefits and other options for treatment, the patient has consented to  Procedure(s) (LRB): CYSTOSCOPY (N/A) ARTIFICIAL URINARY SPHINCTER (N/A) as a surgical intervention .  The patient's history has been reviewed, patient examined, no change in status, stable for surgery.  I have reviewed the patients' chart and labs.  Questions were answered to the patient's satisfaction.     Olanda Downie A

## 2011-12-01 ENCOUNTER — Encounter (HOSPITAL_COMMUNITY): Payer: Self-pay | Admitting: Urology

## 2011-12-01 LAB — BASIC METABOLIC PANEL
BUN: 10 mg/dL (ref 6–23)
CO2: 27 mEq/L (ref 19–32)
Calcium: 8.7 mg/dL (ref 8.4–10.5)
Chloride: 100 mEq/L (ref 96–112)
Creatinine, Ser: 0.85 mg/dL (ref 0.50–1.35)
GFR calc Af Amer: >90 mL/min (ref >90)
GFR calc non Af Amer: >90 mL/min (ref >90)
Glucose, Bld: 126 mg/dL — ABNORMAL HIGH (ref 70–99)
Potassium: 3.9 mEq/L (ref 3.5–5.1)
Sodium: 135 mEq/L (ref 135–145)

## 2011-12-01 LAB — HEMOGLOBIN AND HEMATOCRIT, BLOOD
HCT: 33 % — ABNORMAL LOW (ref 39.0–52.0)
Hemoglobin: 10.8 g/dL — ABNORMAL LOW (ref 13.0–17.0)

## 2011-12-01 MED ORDER — ROSUVASTATIN CALCIUM 10 MG PO TABS
10.0000 mg | ORAL_TABLET | Freq: Every day | ORAL | Status: DC
  Filled 2011-12-01: qty 1

## 2011-12-01 NOTE — Progress Notes (Signed)
MD was notified of patients voiding trials. Patient urinating >100 cc each time. OK to discharge at this time. Setzer, Don Broach

## 2011-12-01 NOTE — Progress Notes (Signed)
Patient voided 120cc's in urinal. No complications. Bladder scan revealed 40cc in bladder. Will continue to monitor patient.

## 2011-12-01 NOTE — Progress Notes (Signed)
Dr. Sherron Monday notified of patients voiding trial. Catheter was taken out two hours ago, patient tried multiple times to void. Bladder scan revealed about 680 cc of urine in bladder. Plans to put new foley in patient. Will continue to monitor patient. Setzer, Don Broach

## 2011-12-01 NOTE — Progress Notes (Signed)
Vitals normal Laboratory tests normal Patient alert and stable Pain minimal and well-controlled Post treatment course discussed in detail Followup discussed in detail See orders 

## 2011-12-01 NOTE — Progress Notes (Signed)
Foley discontinued. Patient tolerated well. Will continue to monitor patient.

## 2011-12-01 NOTE — Progress Notes (Signed)
PHARMACIST - PHYSICIAN COMMUNICATION CONCERNING:  Home Medications Drug Interaction  Prior to admission, patient was on Lipitor and Gemfibrozil.  The combination of these two medication puts the patient at high risk for rhabdomyolysis.  Will switch to Lipitor to the equivalent dose of Crestor while patient is here.  Acceptable statins with gemfibrozil are: Rosuvastatin 10mg  OR suggest changing gemfibrozil to fenofibrate (15 fold reduction in risk of rhabdomyolysis as compared to gemfibrozil).  RECOMMENDATION:  Please consider addressing home meds at discharge or on outpatient basis.  Clance Boll, PharmD, BCPS Pager: [phone] 12/01/2011 10:25 AM

## 2012-02-14 ENCOUNTER — Other Ambulatory Visit: Payer: Self-pay | Admitting: Family Medicine

## 2012-02-14 DIAGNOSIS — M542 Cervicalgia: Secondary | ICD-10-CM

## 2012-02-16 ENCOUNTER — Ambulatory Visit
Admission: RE | Admit: 2012-02-16 | Discharge: 2012-02-16 | Disposition: A | Discharge status: Home / Self Care | Payer: BC Managed Care – PPO | Source: Ambulatory Visit | Attending: Family Medicine | Admitting: Family Medicine

## 2012-02-16 DIAGNOSIS — M542 Cervicalgia: Secondary | ICD-10-CM

## 2012-03-10 ENCOUNTER — Other Ambulatory Visit: Payer: Self-pay | Admitting: Family Medicine

## 2012-03-10 DIAGNOSIS — R109 Unspecified abdominal pain: Secondary | ICD-10-CM

## 2012-03-14 ENCOUNTER — Ambulatory Visit
Admission: RE | Admit: 2012-03-14 | Discharge: 2012-03-14 | Disposition: A | Discharge status: Home / Self Care | Payer: BC Managed Care – PPO | Source: Ambulatory Visit | Attending: Family Medicine | Admitting: Family Medicine

## 2012-03-14 DIAGNOSIS — R109 Unspecified abdominal pain: Secondary | ICD-10-CM

## 2012-10-05 ENCOUNTER — Emergency Department (HOSPITAL_COMMUNITY): Payer: BC Managed Care – PPO

## 2012-10-05 ENCOUNTER — Emergency Department (HOSPITAL_COMMUNITY)
Admission: EM | Admit: 2012-10-05 | Discharge: 2012-10-06 | Disposition: A | Discharge status: Home / Self Care | Payer: BC Managed Care – PPO | Attending: Emergency Medicine | Admitting: Emergency Medicine

## 2012-10-05 DIAGNOSIS — Z79899 Other long term (current) drug therapy: Secondary | ICD-10-CM | POA: Insufficient documentation

## 2012-10-05 DIAGNOSIS — C61 Malignant neoplasm of prostate: Secondary | ICD-10-CM | POA: Insufficient documentation

## 2012-10-05 DIAGNOSIS — Z923 Personal history of irradiation: Secondary | ICD-10-CM | POA: Insufficient documentation

## 2012-10-05 DIAGNOSIS — R079 Chest pain, unspecified: Secondary | ICD-10-CM

## 2012-10-05 DIAGNOSIS — I10 Essential (primary) hypertension: Secondary | ICD-10-CM | POA: Insufficient documentation

## 2012-10-05 DIAGNOSIS — R0602 Shortness of breath: Secondary | ICD-10-CM | POA: Insufficient documentation

## 2012-10-05 DIAGNOSIS — R61 Generalized hyperhidrosis: Secondary | ICD-10-CM | POA: Insufficient documentation

## 2012-10-05 DIAGNOSIS — Z87448 Personal history of other diseases of urinary system: Secondary | ICD-10-CM | POA: Insufficient documentation

## 2012-10-05 DIAGNOSIS — Z8669 Personal history of other diseases of the nervous system and sense organs: Secondary | ICD-10-CM | POA: Insufficient documentation

## 2012-10-05 DIAGNOSIS — R0789 Other chest pain: Secondary | ICD-10-CM | POA: Insufficient documentation

## 2012-10-05 DIAGNOSIS — E785 Hyperlipidemia, unspecified: Secondary | ICD-10-CM | POA: Insufficient documentation

## 2012-10-05 DIAGNOSIS — F429 Obsessive-compulsive disorder, unspecified: Secondary | ICD-10-CM | POA: Insufficient documentation

## 2012-10-05 LAB — POCT I-STAT TROPONIN I: Troponin i, poc: 0.01 ng/mL (ref 0.00–0.08)

## 2012-10-05 LAB — COMPREHENSIVE METABOLIC PANEL
ALT: 44 U/L (ref 0–53)
AST: 25 U/L (ref 0–37)
Albumin: 3.5 g/dL (ref 3.5–5.2)
Alkaline Phosphatase: 85 U/L (ref 39–117)
BUN: 25 mg/dL — ABNORMAL HIGH (ref 6–23)
CO2: 24 mEq/L (ref 19–32)
Calcium: 9 mg/dL (ref 8.4–10.5)
Chloride: 99 mEq/L (ref 96–112)
Creatinine, Ser: 0.92 mg/dL (ref 0.50–1.35)
GFR calc Af Amer: >90 mL/min (ref >90)
GFR calc non Af Amer: 90 mL/min — ABNORMAL LOW (ref >90)
Glucose, Bld: 120 mg/dL — ABNORMAL HIGH (ref 70–99)
Potassium: 3.9 mEq/L (ref 3.5–5.1)
Sodium: 137 mEq/L (ref 135–145)
Total Bilirubin: 0.1 mg/dL — ABNORMAL LOW (ref 0.3–1.2)
Total Protein: 6.9 g/dL (ref 6.0–8.3)

## 2012-10-05 LAB — CBC
HCT: 33.8 % — ABNORMAL LOW (ref 39.0–52.0)
Hemoglobin: 11.6 g/dL — ABNORMAL LOW (ref 13.0–17.0)
MCH: 28.9 pg (ref 26.0–34.0)
MCHC: 34.3 g/dL (ref 30.0–36.0)
MCV: 84.3 fL (ref 78.0–100.0)
Platelets: 247 10*3/uL (ref 150–400)
RBC: 4.01 MIL/uL — ABNORMAL LOW (ref 4.22–5.81)
RDW: 12.3 % (ref 11.5–15.5)
WBC: 9 10*3/uL (ref 4.0–10.5)

## 2012-10-05 LAB — D-DIMER, QUANTITATIVE (NOT AT ARMC): D-Dimer, Quant: 0.32 ug/mL-FEU (ref 0.00–0.48)

## 2012-10-05 LAB — PRO B NATRIURETIC PEPTIDE: Pro B Natriuretic peptide (BNP): 99.9 pg/mL (ref 0–125)

## 2012-10-05 MED ORDER — MORPHINE SULFATE 4 MG/ML IJ SOLN
4.0000 mg | Freq: Once | INTRAMUSCULAR | Status: AC
  Administered 2012-10-05: 4 mg via INTRAVENOUS
  Filled 2012-10-05: qty 1

## 2012-10-05 MED ORDER — ASPIRIN 81 MG PO CHEW
324.0000 mg | CHEWABLE_TABLET | Freq: Once | ORAL | Status: AC
  Administered 2012-10-05: 243 mg via ORAL
  Filled 2012-10-05: qty 3

## 2012-10-05 MED ORDER — ONDANSETRON HCL 4 MG/2ML IJ SOLN
4.0000 mg | Freq: Once | INTRAMUSCULAR | Status: AC
Start: 2012-10-05 — End: 2012-10-05
  Administered 2012-10-05: 4 mg via INTRAVENOUS
  Filled 2012-10-05: qty 2

## 2012-10-05 NOTE — ED Notes (Signed)
To ED from [facility] via [facility], on EMS arrival, pt was pale and diaphoretic and SOB, LS clear, pt reports "burning in his lungs" 132/74, 82, 97% on RA, 20g right hand, NAD

## 2012-10-05 NOTE — ED Provider Notes (Signed)
History     CSN: [id number]  Arrival date & time 10/05/12  2036   First MD Initiated Contact with Patient 10/05/12 2054      Chief Complaint  Patient presents with  . Shortness of Breath    (Consider location/radiation/quality/duration/timing/severity/associated sxs/prior treatment) HPI  Blake Carter is a 62 y.o. male S. medical history significant for hypertension, hyperlipidemia, prostate cancer in remission, on Lupron hormone therapy. Patient comes in complaining of burning sensation across the upper bilateral chest that is exacerbated by exertion. It is also associated with shortness of breath also exacerbated by exertion. Patient states this is been going on for several months this is significantly worse in the last 4 weeks. He has been evaluated by his primary care physician for this and is being treated for an allergic reactive airway disease. 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 It was associated with diaphoresis.  Denies  PND, orthopnea, N/V, diaphoresis, cough, fever, back pain, syncope, prior episodes, recent cocaine/methamphetimine use. Denies h/o DVT, PE,  recent travel, leg swelling, hemoptysis.  Pt has not received any ASA or NTG in the last 24 hours.  RF: No smoking Last Stress test: 3 years Cardiologost: None   Past Medical History  Diagnosis Date  . Hypertension   . Hyperlipidemia   . Environmental allergies     dry cough   . Cancer     prostate cancer- robotic prostatectomy - followed by radiation because of positive lymph node--and pt on lupron injections  . OCD (obsessive compulsive disorder)   . PONV (postoperative nausea and vomiting)     after thyroid surgery  . SUI (stress urinary incontinence), male     S/P ROBOTIC PROSTATECTOMY AND RADIATION TX  . Sleep apnea 11/23/11     STOP BANG SCORE OF 4    Past Surgical History  Procedure Laterality Date  . Robotic prostatectomy  jan 2012  .  Thyroidectomy, partial  june 1976  . Tonsillectomy  1956 ?  Marland Kitchen Eye surgery      BILATERAL LASIK EYE SURGERY  . Cystoscopy  11/30/2011    Procedure: CYSTOSCOPY;  Surgeon: Martina Sinner, MD;  Location: [facility];  Service: Urology;  Laterality: N/A;  Inplantation of Artificial Sphincter and Cystoscopy  . Urinary sphincter implant  11/30/2011    Procedure: ARTIFICIAL URINARY SPHINCTER;  Surgeon: Martina Sinner, MD;  Location: [facility];  Service: Urology;  Laterality: N/A;    No family history on file.  History  Substance Use Topics  . Smoking status: Never Smoker   . Smokeless tobacco: Not on file  . Alcohol Use: Yes     Comment: occas      Review of Systems  Constitutional: Negative for fever.  Respiratory: Positive for shortness of breath.   Cardiovascular: Positive for chest pain.  Gastrointestinal: Negative for nausea, vomiting, abdominal pain and diarrhea.  All other systems reviewed and are negative.    Allergies  Niacin and related and Tagamet  Home Medications   Current Outpatient Rx  Name  Route  Sig  Dispense  Refill  . amLODipine (NORVASC) 5 MG tablet   Oral   Take 5 mg by mouth every morning.         Marland Kitchen atorvastatin (LIPITOR) 20 MG tablet   Oral   Take 20 mg by mouth every morning.         . brimonidine (ALPHAGAN P) 0.1 % SOLN   Both Eyes  Place 1 drop into both eyes 2 (two) times daily.         Marland Kitchen esomeprazole (NEXIUM) 40 MG capsule   Oral   Take 40 mg by mouth daily before breakfast.         . fexofenadine (ALLEGRA) 180 MG tablet   Oral   Take 180 mg by mouth daily.         . fluvoxaMINE (LUVOX) 50 MG tablet   Oral   Take 200 mg by mouth at bedtime. OCD         . gemfibrozil (LOPID) 600 MG tablet   Oral   Take 600 mg by mouth 2 (two) times daily before a meal.         . leuprolide (LUPRON) 30 MG injection   Intramuscular   Inject 30 mg into the muscle every 6 (six) months.         Marland Kitchen lisinopril (PRINIVIL,ZESTRIL) 20 MG  tablet   Oral   Take 20 mg by mouth every morning.         . zolpidem (AMBIEN) 10 MG tablet   Oral   Take 10 mg by mouth at bedtime as needed. Sleep           BP 130/75  Pulse 80  Temp(Src) 98.2 F (36.8 C) (Oral)  Resp 18  SpO2 97%  Physical Exam  Nursing note and vitals reviewed. Constitutional: He is oriented to person, place, and time. He appears well-developed and well-nourished. No distress.  HENT:  Head: Normocephalic.  Mouth/Throat: Oropharynx is clear and moist.  Eyes: Conjunctivae and EOM are normal. Pupils are equal, round, and reactive to light.  Neck: Normal range of motion. No JVD present.  Cardiovascular: Normal rate and regular rhythm.   Pulmonary/Chest: Effort normal and breath sounds normal. No stridor. No respiratory distress. He has no wheezes. He has no rales. He exhibits no tenderness.  Abdominal: Soft. Bowel sounds are normal. He exhibits no distension and no mass. There is no tenderness. There is no rebound and no guarding.  Musculoskeletal: Normal range of motion. He exhibits no edema.  Neurological: He is alert and oriented to person, place, and time.  Psychiatric: He has a normal mood and affect.    ED Course  Procedures (including critical care time)  Labs Reviewed  CBC - Abnormal; Notable for the following:    RBC 4.01 (*)    Hemoglobin 11.6 (*)    HCT 33.8 (*)    All other components within normal limits  COMPREHENSIVE METABOLIC PANEL - Abnormal; Notable for the following:    Glucose, Bld 120 (*)    BUN 25 (*)    Total Bilirubin 0.1 (*)    GFR calc non Af Amer 90 (*)    All other components within normal limits  D-DIMER, QUANTITATIVE  PRO B NATRIURETIC PEPTIDE  POCT I-STAT TROPONIN I  POCT I-STAT TROPONIN I   Dg Chest Port 1 View  10/05/2012  *RADIOLOGY REPORT*  Clinical Data: 62 year old male with shortness of breath.  Burning sensation in the chest.  Hypertension.  PORTABLE CHEST - 1 VIEW  Comparison: 11/11/2011 and earlier.   Findings: Portable upright AP view 2131 hours.  Stable lung volumes with mild elevation of the right hemidiaphragm.  Cardiac size and mediastinal contours are within normal limits.  Visualized tracheal air column is within normal limits.  No pneumothorax, pulmonary edema, pleural effusion or acute pulmonary opacity.  IMPRESSION: No acute cardiopulmonary abnormality.   Original Report Authenticated  By: Erskine Speed, M.D.     Date: 10/06/2012  Rate: 82  Rhythm: normal sinus rhythm  QRS Axis: normal  Intervals: normal  ST/T Wave abnormalities: normal  Conduction Disutrbances:none  Narrative Interpretation:   Old EKG Reviewed: None available  1. SOB (shortness of breath)   2. Chest pain       MDM   Ester Mabe is a 62 y.o. male with worsening dyspnea on exertion and feeling of chest burning also on exertion over the last few months. This is associated with diaphoresis.   dDx ACS - EKG is nonischemic, Delta troponins are both negative. CHF - lung sounds are clear, chest x-ray shows no effusion, BNP is within normal limits. PE - dimer is normal  Patient is to be discharged with recommendation to follow up with PCP in regards to today's hospital visit. Chest pain is not likely of cardiac or pulmonary etiology d/t presentation,  VSS, no tracheal deviation, no JVD or new murmur, RRR, breath sounds equal bilaterally, EKG without acute abnormalities, negative troponin, and negative CXR. Pt has been advised to return to the ED for any worsening or concerning symptoms. Pt appears reliable for follow up and is agreeable to discharge.   Case has been discussed with and seen by Dr. Jeraldine Loots who agrees with the above plan to discharge.   Filed Vitals:   10/06/12 0000 10/06/12 0030 10/06/12 0100 10/06/12 0115  BP: 118/59 113/51 127/70 133/81  Pulse: 75 68 65 73  Temp:      TempSrc:      Resp: 29 15 14 21   SpO2: 96% 95% 96% 97%       Wynetta Emery, PA-C 10/06/12 2011

## 2012-10-06 ENCOUNTER — Encounter (HOSPITAL_COMMUNITY): Payer: Self-pay | Admitting: Emergency Medicine

## 2012-10-06 LAB — POCT I-STAT TROPONIN I: Troponin i, poc: 0.01 ng/mL (ref 0.00–0.08)

## 2012-10-06 MED ORDER — OXYCODONE-ACETAMINOPHEN 5-325 MG PO TABS: ORAL_TABLET | ORAL | Status: DC

## 2012-10-06 MED ORDER — MORPHINE SULFATE 4 MG/ML IJ SOLN
4.0000 mg | Freq: Once | INTRAMUSCULAR | Status: AC
  Administered 2012-10-06: 4 mg via INTRAVENOUS
  Filled 2012-10-06: qty 1

## 2012-10-06 NOTE — ED Provider Notes (Signed)
  Medical screening examination/treatment/procedure(s) were performed by non-physician practitioner and as supervising physician I was immediately available for consultation/collaboration.    Qusai Kem, MD 10/06/12 2248 

## 2012-10-29 HISTORY — PX: TOTAL THYROIDECTOMY: SHX2547

## 2012-11-07 ENCOUNTER — Encounter: Payer: Self-pay | Admitting: Endocrinology

## 2012-11-07 ENCOUNTER — Ambulatory Visit (INDEPENDENT_AMBULATORY_CARE_PROVIDER_SITE_OTHER): Payer: BC Managed Care – PPO | Admitting: Endocrinology

## 2012-11-07 VITALS — BP 126/68 | HR 78 | Ht 70.0 in | Wt 250.0 lb

## 2012-11-07 DIAGNOSIS — G4700 Insomnia, unspecified: Secondary | ICD-10-CM

## 2012-11-07 DIAGNOSIS — F329 Major depressive disorder, single episode, unspecified: Secondary | ICD-10-CM

## 2012-11-07 DIAGNOSIS — I10 Essential (primary) hypertension: Secondary | ICD-10-CM

## 2012-11-07 DIAGNOSIS — J309 Allergic rhinitis, unspecified: Secondary | ICD-10-CM

## 2012-11-07 DIAGNOSIS — E0590 Thyrotoxicosis, unspecified without thyrotoxic crisis or storm: Secondary | ICD-10-CM

## 2012-11-07 DIAGNOSIS — K219 Gastro-esophageal reflux disease without esophagitis: Secondary | ICD-10-CM

## 2012-11-07 DIAGNOSIS — E785 Hyperlipidemia, unspecified: Secondary | ICD-10-CM

## 2012-11-07 MED ORDER — METOPROLOL SUCCINATE ER 25 MG PO TB24: 25.0000 mg | ORAL_TABLET | Freq: Every day | ORAL | Status: DC

## 2012-11-07 NOTE — Progress Notes (Signed)
Subjective:    Patient ID: Blake Carter, male    DOB: [birth year], 62 y.o.   MRN: [id number]  HPI Pt had XRT for enlarged adenoids as a child.  He had resection of his thyroid isthmus in 1976, for a "cold" nodule (benign).  He how reports moderately excessive diaphoresis throughout the body, and assoc palpitations.  He was seen in ER 1 month ago for sinus tachycardia.   Past Medical History  Diagnosis Date  . Hypertension   . Hyperlipidemia   . Environmental allergies     dry cough   . Cancer     prostate cancer- robotic prostatectomy - followed by radiation because of positive lymph node--and pt on lupron injections  . OCD (obsessive compulsive disorder)   . PONV (postoperative nausea and vomiting)     after thyroid surgery  . SUI (stress urinary incontinence), male     S/P ROBOTIC PROSTATECTOMY AND RADIATION TX  . Sleep apnea 11/23/11     STOP BANG SCORE OF 4  . Thyroid disease   . Depression     Past Surgical History  Procedure Laterality Date  . Robotic prostatectomy  jan 2012  . Thyroidectomy, partial  june 1976  . Tonsillectomy  1956 ?  Marland Kitchen Eye surgery      BILATERAL LASIK EYE SURGERY  . Cystoscopy  11/30/2011    Procedure: CYSTOSCOPY;  Surgeon: Martina Sinner, MD;  Location: [facility];  Service: Urology;  Laterality: N/A;  Inplantation of Artificial Sphincter and Cystoscopy  . Urinary sphincter implant  11/30/2011    Procedure: ARTIFICIAL URINARY SPHINCTER;  Surgeon: Martina Sinner, MD;  Location: [facility];  Service: Urology;  Laterality: N/A;  . Thyroidectomy  1976  . Prostate surgery      History   Social History  . Marital Status: Married    Spouse Name: N/A    Number of Children: N/A  . Years of Education: N/A   Occupational History  . Not on file.   Social History Main Topics  . Smoking status: Never Smoker   . Smokeless tobacco: Not on file  . Alcohol Use: Yes     Comment: occas  . Drug Use: No  . Sexually Active: Not on file   Other Topics  Concern  . Not on file   Social History Narrative  . No narrative on file    Current Outpatient Prescriptions on File Prior to Visit  Medication Sig Dispense Refill  . amLODipine (NORVASC) 5 MG tablet Take 5 mg by mouth every morning.      Marland Kitchen aspirin EC 81 MG tablet Take 81 mg by mouth daily.      Marland Kitchen atorvastatin (LIPITOR) 20 MG tablet Take 20 mg by mouth every morning.      . Azelastine HCl (ASTEPRO) 0.15 % SOLN Place 2 sprays into the nose at bedtime.      . cetirizine (ZYRTEC) 10 MG tablet Take 10 mg by mouth daily.      Marland Kitchen esomeprazole (NEXIUM) 40 MG capsule Take 40 mg by mouth daily before breakfast.      . fluticasone (FLONASE) 50 MCG/ACT nasal spray Place 2 sprays into the nose daily.      . fluvoxaMINE (LUVOX) 50 MG tablet Take 150 mg by mouth every morning.       Marland Kitchen gemfibrozil (LOPID) 600 MG tablet Take 600 mg by mouth daily.       Marland Kitchen leuprolide (LUPRON) 30 MG injection Inject 30 mg into the muscle  every 6 (six) months.      Marland Kitchen lisinopril (PRINIVIL,ZESTRIL) 20 MG tablet Take 20 mg by mouth every morning.      Marland Kitchen oxyCODONE-acetaminophen (PERCOCET/ROXICET) 5-325 MG per tablet 1 to 2 tabs PO q6hrs  PRN for pain  15 tablet  0  . PRESCRIPTION MEDICATION Inhale 2 puffs into the lungs every 4 (four) hours as needed (shortness of breath, sample from PCP).       Marland Kitchen zolpidem (AMBIEN) 10 MG tablet Take 10 mg by mouth at bedtime as needed for sleep.        No current facility-administered medications on file prior to visit.    Allergies  Allergen Reactions  . Niacin And Related Other (See Comments)    unknown  . Tagamet (Cimetidine) Other (See Comments)    unknown    Family History  Problem Relation Age of Onset  . Cancer Father   . Hyperlipidemia Father   . Heart disease Father   mother had a goiter  BP 126/68  Pulse 78  Ht 5\' 10"  (1.778 m)  Wt 250 lb (113.399 kg)  BMI 35.87 kg/m2  SpO2 96%  Review of Systems He reports weight loss, anxiety, and tremor. denies headache,  hoarseness, double vision, sob, diarrhea, polyuria, myalgias, rash, numbness, hypoglycemia, easy bruising, and rhinorrhea.      Objective:   Physical Exam VS: see vs page GEN: no distress HEAD: head: no deformity eyes: no periorbital swelling, no proptosis external nose and ears are normal mouth: no lesion seen NECK: a healed scar is present.  i do not appreciate a nodule in the thyroid or elsewhere in the neck CHEST WALL: no deformity LUNGS: clear to auscultation BREASTS:  No gynecomastia CV: reg rate and rhythm, no murmur ABD: abdomen is soft, nontender.  no hepatosplenomegaly.  not distended.  no hernia. MUSCULOSKELETAL: muscle bulk and strength are grossly normal.  no obvious joint swelling.  gait is normal and steady EXTEMITIES: no deformity.  no ulcer on the feet.  feet are of normal color and temp.  no edema PULSES: dorsalis pedis intact bilat.  no carotid bruit NEURO:  cn 2-12 grossly intact.   readily moves all 4's.  sensation is intact to touch on the feet.  No tremor SKIN:  Normal texture and temperature.  No rash or suspicious lesion is visible.  Not diaphoretic NODES:  None palpable at the neck PSYCH: alert, oriented x3.  Does not appear anxious nor depressed.   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  we discussed the causes, risks, and treatment options.  H/o thyroid nodule.  Uncertain relationship, if any, to current hyperthyroidism. Sinus tachycardia, for which he has had to visit ER.  He should have beta-blockade. HTN: if he takes beta-blocker, he'll need to reduce zestril. Prostate cancer, rx'ed with hypogonadism.  This can mimic the sxs of hyperthyroidism.

## 2012-11-07 NOTE — Patient Instructions (Addendum)
let's check a thyroid "scan" (a special, but easy and painless type of thyroid x ray).  It works like this: you go to the x-ray department of the hospital to swallow a pill, which contains a miniscule amount of radiation.  You will not notice any symptoms from this.  You will go back to the x-ray department the next day, to lie down in front of a camera.  The results of this will be sent to me.   Based on the results, i hope to order for you a treatment pill of radioactive iodine.  Although it is a larger amount of radiation, you will again notice no symptoms from this.  The pill is gone from your body in a few days (during which you should stay away from other people), but takes several months to work.  Therefore, please return here approximately 6-8 weeks after the treatment.  This treatment has been available for many years, and the only known side-effect is an underactive thyroid.  It is possible that i would eventually prescribe for you a thyroid hormone pill, which is very inexpensive.  You don't have to worry about side-effects of this thyroid hormone pill, because it is the same molecule your thyroid makes. i have sent a prescription to your pharmacy, to prevent the hert from racing again For now, please reduce the lisinopril to 10 mg daily.

## 2012-11-08 DIAGNOSIS — I10 Essential (primary) hypertension: Secondary | ICD-10-CM | POA: Insufficient documentation

## 2012-11-08 DIAGNOSIS — K219 Gastro-esophageal reflux disease without esophagitis: Secondary | ICD-10-CM | POA: Insufficient documentation

## 2012-11-08 DIAGNOSIS — G4700 Insomnia, unspecified: Secondary | ICD-10-CM | POA: Insufficient documentation

## 2012-11-08 DIAGNOSIS — F329 Major depressive disorder, single episode, unspecified: Secondary | ICD-10-CM | POA: Insufficient documentation

## 2012-11-08 DIAGNOSIS — J309 Allergic rhinitis, unspecified: Secondary | ICD-10-CM | POA: Insufficient documentation

## 2012-11-27 ENCOUNTER — Encounter (HOSPITAL_COMMUNITY)
Admission: RE | Admit: 2012-11-27 | Discharge: 2012-11-27 | Disposition: A | Discharge status: Home / Self Care | Payer: BC Managed Care – PPO | Source: Ambulatory Visit | Attending: Endocrinology | Admitting: Endocrinology

## 2012-11-27 DIAGNOSIS — E0590 Thyrotoxicosis, unspecified without thyrotoxic crisis or storm: Secondary | ICD-10-CM

## 2012-11-28 ENCOUNTER — Ambulatory Visit (HOSPITAL_COMMUNITY)
Admission: RE | Admit: 2012-11-28 | Discharge: 2012-11-28 | Disposition: A | Discharge status: Home / Self Care | Payer: BC Managed Care – PPO | Source: Ambulatory Visit | Attending: Endocrinology | Admitting: Endocrinology

## 2012-11-28 DIAGNOSIS — E0590 Thyrotoxicosis, unspecified without thyrotoxic crisis or storm: Secondary | ICD-10-CM | POA: Insufficient documentation

## 2012-11-28 MED ORDER — SODIUM PERTECHNETATE TC 99M INJECTION
10.6000 | Freq: Once | INTRAVENOUS | Status: AC | PRN
  Administered 2012-11-28: 11 via INTRAVENOUS

## 2012-11-28 MED ORDER — SODIUM IODIDE I 131 CAPSULE
20.1000 | Freq: Once | INTRAVENOUS | Status: AC | PRN
  Administered 2012-11-27: 20.1 via ORAL

## 2012-11-29 ENCOUNTER — Other Ambulatory Visit: Payer: Self-pay | Admitting: Endocrinology

## 2012-11-29 DIAGNOSIS — E0590 Thyrotoxicosis, unspecified without thyrotoxic crisis or storm: Secondary | ICD-10-CM

## 2012-12-05 ENCOUNTER — Other Ambulatory Visit: Payer: Self-pay | Admitting: *Deleted

## 2012-12-05 MED ORDER — METOPROLOL SUCCINATE ER 25 MG PO TB24: 25.0000 mg | ORAL_TABLET | Freq: Every day | ORAL | Status: DC

## 2012-12-08 ENCOUNTER — Other Ambulatory Visit: Payer: Self-pay | Admitting: *Deleted

## 2012-12-08 MED ORDER — METOPROLOL SUCCINATE ER 25 MG PO TB24: 25.0000 mg | ORAL_TABLET | Freq: Every day | ORAL | Status: DC

## 2012-12-11 ENCOUNTER — Ambulatory Visit: Payer: BC Managed Care – PPO | Admitting: Endocrinology

## 2012-12-14 ENCOUNTER — Ambulatory Visit (HOSPITAL_COMMUNITY)
Admission: RE | Admit: 2012-12-14 | Discharge: 2012-12-14 | Disposition: A | Discharge status: Home / Self Care | Payer: BC Managed Care – PPO | Source: Ambulatory Visit | Attending: Endocrinology | Admitting: Endocrinology

## 2012-12-14 DIAGNOSIS — E0590 Thyrotoxicosis, unspecified without thyrotoxic crisis or storm: Secondary | ICD-10-CM | POA: Insufficient documentation

## 2012-12-14 MED ORDER — SODIUM IODIDE I 131 CAPSULE
21.0000 | Freq: Once | INTRAVENOUS | Status: AC | PRN
  Administered 2012-12-14: 21 via ORAL

## 2013-02-05 ENCOUNTER — Ambulatory Visit (INDEPENDENT_AMBULATORY_CARE_PROVIDER_SITE_OTHER): Payer: BC Managed Care – PPO | Admitting: Endocrinology

## 2013-02-05 ENCOUNTER — Encounter: Payer: Self-pay | Admitting: Endocrinology

## 2013-02-05 VITALS — BP 134/82 | HR 52 | Ht 71.0 in | Wt 256.0 lb

## 2013-02-05 DIAGNOSIS — E0590 Thyrotoxicosis, unspecified without thyrotoxic crisis or storm: Secondary | ICD-10-CM

## 2013-02-05 LAB — T4, FREE: Free T4: 0.21 ng/dL — ABNORMAL LOW (ref 0.60–1.60)

## 2013-02-05 LAB — TSH: TSH: 22.99 u[IU]/mL — ABNORMAL HIGH (ref 0.35–5.50)

## 2013-02-05 MED ORDER — LEVOTHYROXINE SODIUM 100 MCG PO TABS: 100.0000 ug | ORAL_TABLET | Freq: Every day | ORAL | Status: DC

## 2013-02-05 NOTE — Progress Notes (Signed)
Subjective:    Patient ID: Blake Carter, male    DOB: [birth year], 62 y.o.   MRN: [id number]  HPI Pt had XRT for enlarged adenoids as a child.  He had resection of his thyroid isthmus in 1976, for a "cold" nodule (benign).  In July of 2014, pt had i-131 rx for hyperthyroidism due to grave's dz.  Since then, he feels better in general.   Past Medical History  Diagnosis Date  . Hypertension   . Hyperlipidemia   . Environmental allergies     dry cough   . Cancer     prostate cancer- robotic prostatectomy - followed by radiation because of positive lymph node--and pt on lupron injections  . OCD (obsessive compulsive disorder)   . PONV (postoperative nausea and vomiting)     after thyroid surgery  . SUI (stress urinary incontinence), male     S/P ROBOTIC PROSTATECTOMY AND RADIATION TX  . Sleep apnea 11/23/11     STOP BANG SCORE OF 4  . Thyroid disease   . Depression     Past Surgical History  Procedure Laterality Date  . Robotic prostatectomy  jan 2012  . Thyroidectomy, partial  june 1976  . Tonsillectomy  1956 ?  Marland Kitchen Eye surgery      BILATERAL LASIK EYE SURGERY  . Cystoscopy  11/30/2011    Procedure: CYSTOSCOPY;  Surgeon: Martina Sinner, MD;  Location: [facility];  Service: Urology;  Laterality: N/A;  Inplantation of Artificial Sphincter and Cystoscopy  . Urinary sphincter implant  11/30/2011    Procedure: ARTIFICIAL URINARY SPHINCTER;  Surgeon: Martina Sinner, MD;  Location: [facility];  Service: Urology;  Laterality: N/A;  . Thyroidectomy  1976  . Prostate surgery      History   Social History  . Marital Status: Married    Spouse Name: N/A    Number of Children: N/A  . Years of Education: N/A   Occupational History  . Not on file.   Social History Main Topics  . Smoking status: Never Smoker   . Smokeless tobacco: Not on file  . Alcohol Use: Yes     Comment: occas  . Drug Use: No  . Sexual Activity: Not on file   Other Topics Concern  . Not on file   Social  History Narrative  . No narrative on file    Current Outpatient Prescriptions on File Prior to Visit  Medication Sig Dispense Refill  . amLODipine (NORVASC) 5 MG tablet Take 5 mg by mouth every morning.      Marland Kitchen aspirin EC 81 MG tablet Take 81 mg by mouth daily.      Marland Kitchen atorvastatin (LIPITOR) 20 MG tablet Take 20 mg by mouth every morning.      . Azelastine HCl (ASTEPRO) 0.15 % SOLN Place 2 sprays into the nose at bedtime.      . cetirizine (ZYRTEC) 10 MG tablet Take 10 mg by mouth daily.      Marland Kitchen esomeprazole (NEXIUM) 40 MG capsule Take 40 mg by mouth daily before breakfast.      . fluticasone (FLONASE) 50 MCG/ACT nasal spray Place 2 sprays into the nose daily.      . fluvoxaMINE (LUVOX) 50 MG tablet Take 150 mg by mouth every morning.       Marland Kitchen gemfibrozil (LOPID) 600 MG tablet Take 600 mg by mouth daily.       Marland Kitchen leuprolide (LUPRON) 30 MG injection Inject 30 mg into the muscle every 6 (  six) months.      Marland Kitchen lisinopril (PRINIVIL,ZESTRIL) 20 MG tablet Take 10 mg by mouth every morning.       Marland Kitchen oxyCODONE-acetaminophen (PERCOCET/ROXICET) 5-325 MG per tablet 1 to 2 tabs PO q6hrs  PRN for pain  15 tablet  0  . predniSONE (DELTASONE) 20 MG tablet       . PRESCRIPTION MEDICATION Inhale 2 puffs into the lungs every 4 (four) hours as needed (shortness of breath, sample from PCP).       Marland Kitchen QVAR 80 MCG/ACT inhaler       . Spacer/Aero-Holding Chambers (OPTICHAMBER DIAMOND) MISC       . zolpidem (AMBIEN) 10 MG tablet Take 10 mg by mouth at bedtime as needed for sleep.        No current facility-administered medications on file prior to visit.    Allergies  Allergen Reactions  . Niacin And Related Other (See Comments)    unknown  . Tagamet [Cimetidine] Other (See Comments)    unknown    Family History  Problem Relation Age of Onset  . Cancer Father   . Hyperlipidemia Father   . Heart disease Father     BP 134/82  Pulse 52  Ht 5\' 11"  (1.803 m)  Wt 256 lb (116.121 kg)  BMI 35.72 kg/m2  SpO2  97%  Review of Systems He has gained a few lbs.      Objective:   Physical Exam VITAL SIGNS:  See vs page GENERAL: no distress NECK: a healed scar is present.  i do not appreciate a nodule in the thyroid or elsewhere in the neck   Lab Results  Component Value Date   TSH 22.99* 02/05/2013      Assessment & Plan:  Post-i-131 hypothyroidism, new

## 2013-02-05 NOTE — Patient Instructions (Addendum)
blood tests are being requested for you today.  We'll contact you with results. Please come back for a follow-up appointment in 1 month. Please stop taking the metoprolol pill.

## 2013-03-05 ENCOUNTER — Encounter: Payer: Self-pay | Admitting: Endocrinology

## 2013-03-05 ENCOUNTER — Ambulatory Visit (INDEPENDENT_AMBULATORY_CARE_PROVIDER_SITE_OTHER): Payer: BC Managed Care – PPO | Admitting: Endocrinology

## 2013-03-05 VITALS — BP 120/70 | HR 55 | Temp 98.1°F | Resp 12 | Wt 261.0 lb

## 2013-03-05 DIAGNOSIS — E890 Postprocedural hypothyroidism: Secondary | ICD-10-CM

## 2013-03-05 DIAGNOSIS — Z23 Encounter for immunization: Secondary | ICD-10-CM

## 2013-03-05 LAB — TSH: TSH: 24.313 u[IU]/mL — ABNORMAL HIGH (ref 0.350–4.500)

## 2013-03-05 NOTE — Progress Notes (Signed)
Subjective:    Patient ID: Blake Carter, male    DOB: 02-25-1951, 62 y.o.   MRN: [id number]  HPI Pt had XRT for enlarged adenoids as a child.  He had resection of his thyroid isthmus in 1976, for a "cold" nodule (benign).  In July of 2014, pt had i-131 rx for hyperthyroidism due to grave's dz.  In September of 2014, he was noted to have elevated TSH, so synthroid was rx'ed.  Since then, he has fatigue, cold intolerance, and intermittent palpitations.   Past Medical History  Diagnosis Date  . Hypertension   . Hyperlipidemia   . Environmental allergies     dry cough   . Cancer     prostate cancer- robotic prostatectomy - followed by radiation because of positive lymph node--and pt on lupron injections  . OCD (obsessive compulsive disorder)   . PONV (postoperative nausea and vomiting)     after thyroid surgery  . SUI (stress urinary incontinence), male     S/P ROBOTIC PROSTATECTOMY AND RADIATION TX  . Sleep apnea 11/23/11     STOP BANG SCORE OF 4  . Thyroid disease   . Depression     Past Surgical History  Procedure Laterality Date  . Robotic prostatectomy  jan 2012  . Thyroidectomy, partial  june 1976  . Tonsillectomy  1956 ?  Marland Kitchen Eye surgery      BILATERAL LASIK EYE SURGERY  . Cystoscopy  11/30/2011    Procedure: CYSTOSCOPY;  Surgeon: Martina Sinner, MD;  Location: [facility];  Service: Urology;  Laterality: N/A;  Inplantation of Artificial Sphincter and Cystoscopy  . Urinary sphincter implant  11/30/2011    Procedure: ARTIFICIAL URINARY SPHINCTER;  Surgeon: Martina Sinner, MD;  Location: [facility];  Service: Urology;  Laterality: N/A;  . Thyroidectomy  1976  . Prostate surgery      History   Social History  . Marital Status: Married    Spouse Name: N/A    Number of Children: N/A  . Years of Education: N/A   Occupational History  . Not on file.   Social History Main Topics  . Smoking status: Never Smoker   . Smokeless tobacco: Not on file  . Alcohol Use: Yes   Comment: occas  . Drug Use: No  . Sexual Activity: Not on file   Other Topics Concern  . Not on file   Social History Narrative  . No narrative on file    Current Outpatient Prescriptions on File Prior to Visit  Medication Sig Dispense Refill  . amLODipine (NORVASC) 5 MG tablet Take 5 mg by mouth every morning.      Marland Kitchen aspirin EC 81 MG tablet Take 81 mg by mouth daily.      Marland Kitchen atorvastatin (LIPITOR) 20 MG tablet Take 20 mg by mouth every morning.      Marland Kitchen esomeprazole (NEXIUM) 40 MG capsule Take 40 mg by mouth daily before breakfast.      . fluvoxaMINE (LUVOX) 50 MG tablet Take 150 mg by mouth every morning.       Marland Kitchen gemfibrozil (LOPID) 600 MG tablet Take 600 mg by mouth daily.       Marland Kitchen lisinopril (PRINIVIL,ZESTRIL) 20 MG tablet Take 10 mg by mouth every morning.       . zolpidem (AMBIEN) 10 MG tablet Take 10 mg by mouth at bedtime as needed for sleep.        No current facility-administered medications on file prior to visit.  Allergies  Allergen Reactions  . Niacin And Related Other (See Comments)    unknown  . Tagamet [Cimetidine] Other (See Comments)    unknown   Family History  Problem Relation Age of Onset  . Cancer Father   . Hyperlipidemia Father   . Heart disease Father    BP 120/70  Pulse 55  Temp(Src) 98.1 F (36.7 C) (Oral)  Resp 12  Wt 261 lb (118.389 kg)  BMI 36.42 kg/m2  SpO2 97%  Review of Systems He has gained a few lbs.     Objective:   Physical Exam VITAL SIGNS:  See vs page GENERAL: no distress Neck: a healed scar is present.  i do not appreciate a nodule in the thyroid or elsewhere in the neck  Lab Results  Component Value Date   TSH 24.313* 03/05/2013      Assessment & Plan:  Post-i-131 hypothyroidism: he needs increased rx

## 2013-03-05 NOTE — Patient Instructions (Addendum)
blood tests are being requested for you today.  We'll contact you with results. Please come back for a follow-up appointment in 1 month.    

## 2013-03-06 MED ORDER — LEVOTHYROXINE SODIUM 150 MCG PO TABS: 150.0000 ug | ORAL_TABLET | Freq: Every day | ORAL | Status: DC

## 2013-04-09 ENCOUNTER — Encounter: Payer: Self-pay | Admitting: Endocrinology

## 2013-04-09 ENCOUNTER — Ambulatory Visit (INDEPENDENT_AMBULATORY_CARE_PROVIDER_SITE_OTHER): Payer: BC Managed Care – PPO | Admitting: Endocrinology

## 2013-04-09 VITALS — BP 130/80 | HR 69 | Temp 97.6°F | Ht 71.0 in | Wt 262.5 lb

## 2013-04-09 DIAGNOSIS — E890 Postprocedural hypothyroidism: Secondary | ICD-10-CM

## 2013-04-09 LAB — TSH: TSH: 12.83 u[IU]/mL — ABNORMAL HIGH (ref 0.35–5.50)

## 2013-04-09 MED ORDER — LEVOTHYROXINE SODIUM 200 MCG PO TABS: 200.0000 ug | ORAL_TABLET | Freq: Every day | ORAL | Status: DC

## 2013-04-09 NOTE — Progress Notes (Signed)
Subjective:    Patient ID: Blake Carter, male    DOB: Sep 17, 1950, 62 y.o.   MRN: [id number]  HPI Pt had XRT for enlarged adenoids as a child.  He had resection of his thyroid isthmus in 1976, for a "cold" nodule (benign).  In July of 2014, pt had i-131 rx for hyperthyroidism due to grave's dz.  In September of 2014, he was noted to have elevated TSH, so synthroid was rx'ed.  Since then, he has fatigue and weight gain.   Past Medical History  Diagnosis Date  . Hypertension   . Hyperlipidemia   . Environmental allergies     dry cough   . Cancer     prostate cancer- robotic prostatectomy - followed by radiation because of positive lymph node--and pt on lupron injections  . OCD (obsessive compulsive disorder)   . PONV (postoperative nausea and vomiting)     after thyroid surgery  . SUI (stress urinary incontinence), male     S/P ROBOTIC PROSTATECTOMY AND RADIATION TX  . Sleep apnea 11/23/11     STOP BANG SCORE OF 4  . Thyroid disease   . Depression     Past Surgical History  Procedure Laterality Date  . Robotic prostatectomy  jan 2012  . Thyroidectomy, partial  june 1976  . Tonsillectomy  1956 ?  Marland Kitchen Eye surgery      BILATERAL LASIK EYE SURGERY  . Cystoscopy  11/30/2011    Procedure: CYSTOSCOPY;  Surgeon: Martina Sinner, MD;  Location: [facility];  Service: Urology;  Laterality: N/A;  Inplantation of Artificial Sphincter and Cystoscopy  . Urinary sphincter implant  11/30/2011    Procedure: ARTIFICIAL URINARY SPHINCTER;  Surgeon: Martina Sinner, MD;  Location: [facility];  Service: Urology;  Laterality: N/A;  . Thyroidectomy  1976  . Prostate surgery      History   Social History  . Marital Status: Married    Spouse Name: N/A    Number of Children: N/A  . Years of Education: N/A   Occupational History  . Not on file.   Social History Main Topics  . Smoking status: Never Smoker   . Smokeless tobacco: Not on file  . Alcohol Use: Yes     Comment: occas  . Drug Use: No  .  Sexual Activity: Not on file   Other Topics Concern  . Not on file   Social History Narrative  . No narrative on file    Current Outpatient Prescriptions on File Prior to Visit  Medication Sig Dispense Refill  . amLODipine (NORVASC) 5 MG tablet Take 5 mg by mouth every morning.      Marland Kitchen aspirin EC 81 MG tablet Take 81 mg by mouth daily.      Marland Kitchen atorvastatin (LIPITOR) 20 MG tablet Take 20 mg by mouth every morning.      . diazepam (VALIUM) 5 MG tablet Take 5 mg by mouth every 6 (six) hours as needed for anxiety.      Marland Kitchen esomeprazole (NEXIUM) 40 MG capsule Take 40 mg by mouth daily before breakfast.      . fluvoxaMINE (LUVOX) 50 MG tablet Take 150 mg by mouth every morning.       Marland Kitchen gemfibrozil (LOPID) 600 MG tablet Take 600 mg by mouth daily.       Marland Kitchen lisinopril (PRINIVIL,ZESTRIL) 20 MG tablet Take 10 mg by mouth every morning.       . zolpidem (AMBIEN) 10 MG tablet Take 10 mg by  mouth at bedtime as needed for sleep.        No current facility-administered medications on file prior to visit.    Allergies  Allergen Reactions  . Niacin And Related Other (See Comments)    unknown  . Tagamet [Cimetidine] Other (See Comments)    unknown    Family History  Problem Relation Age of Onset  . Cancer Father   . Hyperlipidemia Father   . Heart disease Father     BP 130/80  Pulse 69  Temp(Src) 97.6 F (36.4 C) (Oral)  Ht 5\' 11"  (1.803 m)  Wt 262 lb 8 oz (119.069 kg)  BMI 36.63 kg/m2  SpO2 97%   Review of Systems He still has "hot flashes."      Objective:   Physical Exam VITAL SIGNS:  See vs page. GENERAL: no distress. Skin: normal texture and temp. Neuro: no tremor.     Lab Results  Component Value Date   TSH 12.83* 04/09/2013      Assessment & Plan:  Post-i-131 hypothyroidism.  He needs increased rx.

## 2013-04-09 NOTE — Patient Instructions (Signed)
blood tests are being requested for you today.  We'll contact you with results. Please come back for a follow-up appointment in 2 months. 

## 2013-06-08 ENCOUNTER — Ambulatory Visit (INDEPENDENT_AMBULATORY_CARE_PROVIDER_SITE_OTHER): Payer: BC Managed Care – PPO | Admitting: Endocrinology

## 2013-06-08 ENCOUNTER — Encounter: Payer: Self-pay | Admitting: Endocrinology

## 2013-06-08 VITALS — BP 126/84 | HR 62 | Temp 98.9°F | Ht 71.0 in | Wt 244.0 lb

## 2013-06-08 DIAGNOSIS — E890 Postprocedural hypothyroidism: Secondary | ICD-10-CM

## 2013-06-08 LAB — TSH: TSH: 0.05 u[IU]/mL — ABNORMAL LOW (ref 0.35–5.50)

## 2013-06-08 MED ORDER — LEVOTHYROXINE SODIUM 175 MCG PO TABS: 175.0000 ug | ORAL_TABLET | Freq: Every day | ORAL | Status: DC

## 2013-06-08 NOTE — Patient Instructions (Signed)
blood tests are being requested for you today.  We'll contact you with results. Please come back for a follow-up appointment in 3-4 months 

## 2013-06-08 NOTE — Progress Notes (Signed)
Subjective:    Patient ID: Blake Carter, male    DOB: April 15, 1951, 63 y.o.   MRN: [id number]  HPI Pt had XRT for enlarged adenoids as a child.  He had resection of his thyroid isthmus in 1976, for a "cold" nodule (benign).  In July of 2014, pt had i-131 rx for hyperthyroidism due to grave's dz.  In September of 2014, he was noted to have elevated TSH, so synthroid was rx'ed.  pt states he feels well in general.   Past Medical History  Diagnosis Date  . Hypertension   . Hyperlipidemia   . Environmental allergies     dry cough   . Cancer     prostate cancer- robotic prostatectomy - followed by radiation because of positive lymph node--and pt on lupron injections  . OCD (obsessive compulsive disorder)   . PONV (postoperative nausea and vomiting)     after thyroid surgery  . SUI (stress urinary incontinence), male     S/P ROBOTIC PROSTATECTOMY AND RADIATION TX  . Sleep apnea 11/23/11     STOP BANG SCORE OF 4  . Thyroid disease   . Depression     Past Surgical History  Procedure Laterality Date  . Robotic prostatectomy  jan 2012  . Thyroidectomy, partial  june 1976  . Tonsillectomy  1956 ?  Marland Kitchen Eye surgery      BILATERAL LASIK EYE SURGERY  . Cystoscopy  11/30/2011    Procedure: CYSTOSCOPY;  Surgeon: Reece Packer, MD;  Location: [facility];  Service: Urology;  Laterality: N/A;  Inplantation of Artificial Sphincter and Cystoscopy  . Urinary sphincter implant  11/30/2011    Procedure: ARTIFICIAL URINARY SPHINCTER;  Surgeon: Reece Packer, MD;  Location: [facility];  Service: Urology;  Laterality: N/A;  . Thyroidectomy  1976  . Prostate surgery      History   Social History  . Marital Status: Married    Spouse Name: N/A    Number of Children: N/A  . Years of Education: N/A   Occupational History  . Not on file.   Social History Main Topics  . Smoking status: Never Smoker   . Smokeless tobacco: Not on file  . Alcohol Use: Yes     Comment: occas  . Drug Use: No  .  Sexual Activity: Not on file   Other Topics Concern  . Not on file   Social History Narrative  . No narrative on file    Current Outpatient Prescriptions on File Prior to Visit  Medication Sig Dispense Refill  . amLODipine (NORVASC) 5 MG tablet Take 5 mg by mouth every morning.      Marland Kitchen aspirin EC 81 MG tablet Take 81 mg by mouth daily.      Marland Kitchen atorvastatin (LIPITOR) 20 MG tablet Take 20 mg by mouth every morning.      . diazepam (VALIUM) 5 MG tablet Take 5 mg by mouth every 6 (six) hours as needed for anxiety.      Marland Kitchen esomeprazole (NEXIUM) 40 MG capsule Take 40 mg by mouth daily before breakfast.      . fluvoxaMINE (LUVOX) 50 MG tablet Take 150 mg by mouth every morning.       Marland Kitchen gemfibrozil (LOPID) 600 MG tablet Take 600 mg by mouth daily.       Marland Kitchen lisinopril (PRINIVIL,ZESTRIL) 20 MG tablet Take 10 mg by mouth every morning.       . zolpidem (AMBIEN) 10 MG tablet Take 10 mg by  mouth at bedtime as needed for sleep.        No current facility-administered medications on file prior to visit.    Allergies  Allergen Reactions  . Niacin And Related Other (See Comments)    unknown  . Tagamet [Cimetidine] Other (See Comments)    unknown    Family History  Problem Relation Age of Onset  . Cancer Father   . Hyperlipidemia Father   . Heart disease Father    BP 126/84  Pulse 62  Temp(Src) 98.9 F (37.2 C) (Oral)  Ht 5\' 11"  (1.803 m)  Wt 244 lb (110.678 kg)  BMI 34.05 kg/m2  SpO2 97%  Review of Systems Denies weight change.    Objective:   Physical Exam VITAL SIGNS:  See vs page GENERAL: no distress NECK: There is no palpable thyroid enlargement.  No thyroid nodule is palpable.  No palpable lymphadenopathy at the anterior neck.   Lab Results  Component Value Date   TSH 0.05* 06/08/2013      Assessment & Plan:  Post-i-131 hypothyroidism.  overreplaced.

## 2013-07-29 HISTORY — PX: COLONOSCOPY: SHX174

## 2013-09-11 ENCOUNTER — Encounter: Payer: Self-pay | Admitting: Endocrinology

## 2013-09-11 ENCOUNTER — Ambulatory Visit (INDEPENDENT_AMBULATORY_CARE_PROVIDER_SITE_OTHER): Payer: BC Managed Care – PPO | Admitting: Endocrinology

## 2013-09-11 VITALS — BP 106/60 | HR 65 | Temp 98.4°F | Ht 71.0 in | Wt 249.0 lb

## 2013-09-11 DIAGNOSIS — E890 Postprocedural hypothyroidism: Secondary | ICD-10-CM

## 2013-09-11 LAB — TSH: TSH: 0.05 u[IU]/mL — ABNORMAL LOW (ref 0.35–5.50)

## 2013-09-11 LAB — T4, FREE: Free T4: 1.01 ng/dL (ref 0.60–1.60)

## 2013-09-11 MED ORDER — LEVOTHYROXINE SODIUM 125 MCG PO TABS: 125.0000 ug | ORAL_TABLET | Freq: Every day | ORAL | Status: DC

## 2013-09-11 NOTE — Progress Notes (Signed)
Subjective:    Patient ID: Blake Carter, male    DOB: 01-14-1951, 63 y.o.   MRN: [id number]  HPI Pt had XRT for enlarged adenoids as a child.  He had resection of his thyroid isthmus in 1976, for a "cold" nodule (benign).  In July of 2014, pt had i-131 rx for hyperthyroidism due to grave's dz.  In September of 2014, he was noted to have elevated TSH, so synthroid was rx'ed.  pt states he feels well in general.   Past Medical History  Diagnosis Date  . Hypertension   . Hyperlipidemia   . Environmental allergies     dry cough   . Cancer     prostate cancer- robotic prostatectomy - followed by radiation because of positive lymph node--and pt on lupron injections  . OCD (obsessive compulsive disorder)   . PONV (postoperative nausea and vomiting)     after thyroid surgery  . SUI (stress urinary incontinence), male     S/P ROBOTIC PROSTATECTOMY AND RADIATION TX  . Sleep apnea 11/23/11     STOP BANG SCORE OF 4  . Thyroid disease   . Depression     Past Surgical History  Procedure Laterality Date  . Robotic prostatectomy  jan 2012  . Thyroidectomy, partial  june 1976  . Tonsillectomy  1956 ?  Marland Kitchen Eye surgery      BILATERAL LASIK EYE SURGERY  . Cystoscopy  11/30/2011    Procedure: CYSTOSCOPY;  Surgeon: Reece Packer, MD;  Location: [facility];  Service: Urology;  Laterality: N/A;  Inplantation of Artificial Sphincter and Cystoscopy  . Urinary sphincter implant  11/30/2011    Procedure: ARTIFICIAL URINARY SPHINCTER;  Surgeon: Reece Packer, MD;  Location: [facility];  Service: Urology;  Laterality: N/A;  . Thyroidectomy  1976  . Prostate surgery      History   Social History  . Marital Status: Married    Spouse Name: N/A    Number of Children: N/A  . Years of Education: N/A   Occupational History  . Not on file.   Social History Main Topics  . Smoking status: Never Smoker   . Smokeless tobacco: Not on file  . Alcohol Use: Yes     Comment: occas  . Drug Use: No  .  Sexual Activity: Not on file   Other Topics Concern  . Not on file   Social History Narrative  . No narrative on file    Current Outpatient Prescriptions on File Prior to Visit  Medication Sig Dispense Refill  . amLODipine (NORVASC) 5 MG tablet Take 5 mg by mouth every morning.      Marland Kitchen aspirin EC 81 MG tablet Take 81 mg by mouth daily.      Marland Kitchen atorvastatin (LIPITOR) 20 MG tablet Take 20 mg by mouth every morning.      . diazepam (VALIUM) 5 MG tablet Take 5 mg by mouth every 6 (six) hours as needed for anxiety.      Marland Kitchen esomeprazole (NEXIUM) 40 MG capsule Take 40 mg by mouth daily before breakfast.      . fluvoxaMINE (LUVOX) 50 MG tablet Take 150 mg by mouth every morning.       Marland Kitchen gemfibrozil (LOPID) 600 MG tablet Take 600 mg by mouth daily.       Marland Kitchen lisinopril (PRINIVIL,ZESTRIL) 20 MG tablet Take 10 mg by mouth every morning.       . zolpidem (AMBIEN) 10 MG tablet Take 10 mg by  mouth at bedtime as needed for sleep.        No current facility-administered medications on file prior to visit.    Allergies  Allergen Reactions  . Niacin And Related Other (See Comments)    unknown  . Tagamet [Cimetidine] Other (See Comments)    unknown    Family History  Problem Relation Age of Onset  . Cancer Father   . Hyperlipidemia Father   . Heart disease Father     BP 106/60  Pulse 65  Temp(Src) 98.4 F (36.9 C) (Oral)  Ht 5\' 11"  (1.803 m)  Wt 249 lb (112.946 kg)  BMI 34.74 kg/m2  SpO2 95%   Review of Systems He has lost 10 lbs, due to his efforts.      Objective:   Physical Exam VITAL SIGNS:  See vs page GENERAL: no distress NECK: There is no palpable thyroid enlargement.  No thyroid nodule is palpable.  No palpable lymphadenopathy at the anterior neck.   Lab Results  Component Value Date   TSH 0.05* 09/11/2013      Assessment & Plan:  Post-i-131 hypothyroidism: overreplaced

## 2013-09-11 NOTE — Patient Instructions (Signed)
blood tests are being requested for you today.  We'll contact you with results. Please come back for a follow-up appointment in 3-6 months.    

## 2013-11-23 ENCOUNTER — Other Ambulatory Visit (HOSPITAL_COMMUNITY): Payer: BC Managed Care – PPO

## 2013-11-26 ENCOUNTER — Other Ambulatory Visit: Payer: Self-pay | Admitting: General Surgery

## 2013-11-26 DIAGNOSIS — I77810 Thoracic aortic ectasia: Secondary | ICD-10-CM

## 2013-11-27 ENCOUNTER — Ambulatory Visit (HOSPITAL_COMMUNITY): Payer: BC Managed Care – PPO | Attending: Cardiology | Admitting: Radiology

## 2013-11-27 DIAGNOSIS — E785 Hyperlipidemia, unspecified: Secondary | ICD-10-CM | POA: Insufficient documentation

## 2013-11-27 DIAGNOSIS — E669 Obesity, unspecified: Secondary | ICD-10-CM | POA: Insufficient documentation

## 2013-11-27 DIAGNOSIS — I77810 Thoracic aortic ectasia: Secondary | ICD-10-CM | POA: Insufficient documentation

## 2013-11-27 DIAGNOSIS — I10 Essential (primary) hypertension: Secondary | ICD-10-CM | POA: Insufficient documentation

## 2013-11-27 NOTE — Progress Notes (Signed)
Echocardiogram performed.  

## 2013-11-29 ENCOUNTER — Other Ambulatory Visit: Payer: Self-pay | Admitting: General Surgery

## 2013-11-29 DIAGNOSIS — I77810 Thoracic aortic ectasia: Secondary | ICD-10-CM

## 2013-12-06 ENCOUNTER — Other Ambulatory Visit: Payer: Self-pay | Admitting: General Surgery

## 2013-12-06 ENCOUNTER — Telehealth: Payer: Self-pay | Admitting: Cardiology

## 2013-12-06 DIAGNOSIS — I77810 Thoracic aortic ectasia: Secondary | ICD-10-CM

## 2013-12-06 NOTE — Telephone Encounter (Signed)
New message  ° ° °Returning call back to nurse.  °

## 2013-12-06 NOTE — Telephone Encounter (Signed)
Gave pt. results

## 2013-12-11 ENCOUNTER — Ambulatory Visit (INDEPENDENT_AMBULATORY_CARE_PROVIDER_SITE_OTHER)
Admission: RE | Admit: 2013-12-11 | Discharge: 2013-12-11 | Disposition: A | Discharge status: Home / Self Care | Payer: BC Managed Care – PPO | Source: Ambulatory Visit | Attending: Cardiology | Admitting: Cardiology

## 2013-12-11 DIAGNOSIS — I77810 Thoracic aortic ectasia: Secondary | ICD-10-CM

## 2013-12-11 MED ORDER — IOHEXOL 350 MG/ML SOLN
100.0000 mL | Freq: Once | INTRAVENOUS | Status: AC | PRN
  Administered 2013-12-11: 100 mL via INTRAVENOUS

## 2013-12-12 ENCOUNTER — Encounter: Payer: Self-pay | Admitting: Endocrinology

## 2013-12-12 ENCOUNTER — Ambulatory Visit (INDEPENDENT_AMBULATORY_CARE_PROVIDER_SITE_OTHER): Payer: BC Managed Care – PPO | Admitting: Endocrinology

## 2013-12-12 VITALS — BP 122/86 | HR 63 | Temp 98.7°F | Wt 262.0 lb

## 2013-12-12 DIAGNOSIS — E890 Postprocedural hypothyroidism: Secondary | ICD-10-CM

## 2013-12-12 LAB — TSH: TSH: 3.22 u[IU]/mL (ref 0.35–4.50)

## 2013-12-12 NOTE — Patient Instructions (Signed)
blood tests are being requested for you today.  We'll contact you with results. Please come back for a follow-up appointment in 3-6 months.

## 2013-12-12 NOTE — Progress Notes (Signed)
Subjective:    Patient ID: Blake Carter, male    DOB: 05-15-1951, 63 y.o.   MRN: [id number]  HPI Pt returns for f/u of pos-I-131 hypothyroidism (he had XRT for enlarged adenoids as a child; he had resection of his thyroid isthmus in 1976, for a "cold" nodule (benign); in July of 2014, pt had i-131 rx for hyperthyroidism due to grave's dz; in September of 2014, he was noted to have elevated TSH, so synthroid was rx'ed).  pt states he feels well in general. Since synthroid was reduced, he has weight gain and cold intolerance.   Past Medical History  Diagnosis Date  . Hypertension   . Hyperlipidemia   . Environmental allergies     dry cough   . Cancer     prostate cancer- robotic prostatectomy - followed by radiation because of positive lymph node--and pt on lupron injections  . OCD (obsessive compulsive disorder)   . PONV (postoperative nausea and vomiting)     after thyroid surgery  . SUI (stress urinary incontinence), male     S/P ROBOTIC PROSTATECTOMY AND RADIATION TX  . Sleep apnea 11/23/11     STOP BANG SCORE OF 4  . Thyroid disease   . Depression     Past Surgical History  Procedure Laterality Date  . Robotic prostatectomy  jan 2012  . Thyroidectomy, partial  june 1976  . Tonsillectomy  1956 ?  Marland Kitchen Eye surgery      BILATERAL LASIK EYE SURGERY  . Cystoscopy  11/30/2011    Procedure: CYSTOSCOPY;  Surgeon: Reece Packer, MD;  Location: [facility];  Service: Urology;  Laterality: N/A;  Inplantation of Artificial Sphincter and Cystoscopy  . Urinary sphincter implant  11/30/2011    Procedure: ARTIFICIAL URINARY SPHINCTER;  Surgeon: Reece Packer, MD;  Location: [facility];  Service: Urology;  Laterality: N/A;  . Thyroidectomy  1976  . Prostate surgery      History   Social History  . Marital Status: Married    Spouse Name: N/A    Number of Children: N/A  . Years of Education: N/A   Occupational History  . Not on file.   Social History Main Topics  . Smoking status:  Never Smoker   . Smokeless tobacco: Not on file  . Alcohol Use: Yes     Comment: occas  . Drug Use: No  . Sexual Activity: Not on file   Other Topics Concern  . Not on file   Social History Narrative  . No narrative on file    Current Outpatient Prescriptions on File Prior to Visit  Medication Sig Dispense Refill  . amLODipine (NORVASC) 5 MG tablet Take 5 mg by mouth every morning.      Marland Kitchen aspirin EC 81 MG tablet Take 81 mg by mouth daily.      Marland Kitchen atorvastatin (LIPITOR) 20 MG tablet Take 20 mg by mouth every morning.      . diazepam (VALIUM) 5 MG tablet Take 5 mg by mouth every 6 (six) hours as needed for anxiety.      Marland Kitchen esomeprazole (NEXIUM) 40 MG capsule Take 40 mg by mouth daily before breakfast.      . fluvoxaMINE (LUVOX) 50 MG tablet Take 150 mg by mouth every morning.       Marland Kitchen gemfibrozil (LOPID) 600 MG tablet Take 600 mg by mouth daily.       Marland Kitchen levothyroxine (SYNTHROID, LEVOTHROID) 125 MCG tablet Take 1 tablet (125 mcg total) by  mouth daily before breakfast.  30 tablet  5  . lisinopril (PRINIVIL,ZESTRIL) 20 MG tablet Take 10 mg by mouth every morning.       . zolpidem (AMBIEN) 10 MG tablet Take 10 mg by mouth at bedtime as needed for sleep.        No current facility-administered medications on file prior to visit.    Allergies  Allergen Reactions  . Niacin And Related Other (See Comments)    unknown  . Tagamet [Cimetidine] Other (See Comments)    unknown    Family History  Problem Relation Age of Onset  . Cancer Father   . Hyperlipidemia Father   . Heart disease Father     BP 122/86  Pulse 63  Temp(Src) 98.7 F (37.1 C) (Oral)  Wt 262 lb (118.842 kg)  SpO2 91%   Review of Systems Denies neck swelling.    Objective:   Physical Exam VITAL SIGNS:  See vs page GENERAL: no distress Skin: not diaphoretic Neuro: no tremor   Lab Results  Component Value Date   TSH 3.22 12/12/2013      Assessment & Plan:  Hypothyroidism: well-replaced  Patient is  advised the following: Patient Instructions  blood tests are being requested for you today.  We'll contact you with results. Please come back for a follow-up appointment in 3-6 months.

## 2013-12-17 ENCOUNTER — Encounter: Payer: Self-pay | Admitting: General Surgery

## 2013-12-17 DIAGNOSIS — I77810 Thoracic aortic ectasia: Secondary | ICD-10-CM

## 2013-12-19 ENCOUNTER — Other Ambulatory Visit: Payer: Self-pay | Admitting: General Surgery

## 2013-12-19 DIAGNOSIS — I2510 Atherosclerotic heart disease of native coronary artery without angina pectoris: Secondary | ICD-10-CM

## 2013-12-19 DIAGNOSIS — I2584 Coronary atherosclerosis due to calcified coronary lesion: Secondary | ICD-10-CM

## 2014-01-01 ENCOUNTER — Ambulatory Visit (HOSPITAL_COMMUNITY): Payer: BC Managed Care – PPO | Attending: Cardiology | Admitting: Radiology

## 2014-01-01 VITALS — BP 130/86 | HR 50 | Ht 71.0 in | Wt 256.0 lb

## 2014-01-01 DIAGNOSIS — I2584 Coronary atherosclerosis due to calcified coronary lesion: Secondary | ICD-10-CM | POA: Insufficient documentation

## 2014-01-01 DIAGNOSIS — R9439 Abnormal result of other cardiovascular function study: Secondary | ICD-10-CM

## 2014-01-01 DIAGNOSIS — Z8249 Family history of ischemic heart disease and other diseases of the circulatory system: Secondary | ICD-10-CM | POA: Insufficient documentation

## 2014-01-01 DIAGNOSIS — I2510 Atherosclerotic heart disease of native coronary artery without angina pectoris: Secondary | ICD-10-CM | POA: Insufficient documentation

## 2014-01-01 DIAGNOSIS — I5189 Other ill-defined heart diseases: Secondary | ICD-10-CM | POA: Insufficient documentation

## 2014-01-01 MED ORDER — TECHNETIUM TC 99M SESTAMIBI GENERIC - CARDIOLITE
11.0000 | Freq: Once | INTRAVENOUS | Status: AC | PRN
  Administered 2014-01-01: 11 via INTRAVENOUS

## 2014-01-01 MED ORDER — TECHNETIUM TC 99M SESTAMIBI GENERIC - CARDIOLITE
33.0000 | Freq: Once | INTRAVENOUS | Status: AC | PRN
  Administered 2014-01-01: 33 via INTRAVENOUS

## 2014-01-01 MED ORDER — REGADENOSON 0.4 MG/5ML IV SOLN
0.4000 mg | Freq: Once | INTRAVENOUS | Status: AC
  Administered 2014-01-01: 0.4 mg via INTRAVENOUS

## 2014-01-01 NOTE — Progress Notes (Signed)
[place] [address] [place], [place] [address] [phone]    Cardiology Nuclear Med Study  Blake Carter is a 63 y.o. male     MRN : [id number]     DOB: 02-07-1951  Procedure Date: 01/01/2014  Nuclear Med Background Indication for Stress Test:  Evaluation for Ischemia History:  No known CAD, Echo 2015 EF 55-60%, MPI 2014 EF 70%, Cardiac CT 11/2013 (+ coron. Calcif.) Cardiac Risk Factors: Family History - CAD, Hypertension, Lipids and PVD  Symptoms:  None indicated   Nuclear Pre-Procedure Caffeine/Decaff Intake:  None NPO After: 10:00pm   Lungs:  clear O2 Sat: 97% on room air. IV 0.9% NS with Angio Cath:  22g  IV Site: R Hand  IV Started by:  Blake Haymaker, RN  Chest Size (in):  48 Cup Size: n/a  Height: 5\' 11"  (1.803 m)  Weight:  256 lb (116.121 kg)  BMI:  Body mass index is 35.72 kg/(m^2). Tech Comments:  n/a    Nuclear Med Study 1 or 2 day study: 1 day  Stress Test Type:  Treadmill/Lexiscan  Reading MD: n/a  Order Authorizing Provider:  Tressia Miners Turner,MD  Resting Radionuclide: Technetium 34m Sestamibi  Resting Radionuclide Dose: 11.0 mCi   Stress Radionuclide:  Technetium 42m Sestamibi  Stress Radionuclide Dose: 33.0 mCi           Stress Protocol Rest HR: 50 Stress HR: 93  Rest BP: 130/86 Stress BP: 135/106  Exercise Time (min): n/a METS: n/a           Dose of Adenosine (mg):  n/a Dose of Lexiscan: 0.4 mg  Dose of Atropine (mg): n/a Dose of Dobutamine: n/a mcg/kg/min (at max HR)  Stress Test Technologist: Blake Carter, BS-ES  Nuclear Technologist:  Blake Carter, CNMT     Rest Procedure:  Myocardial perfusion imaging was performed at rest 45 minutes following the intravenous administration of Technetium 37m Sestamibi. Rest ECG: Sinus bradycardia, non-specific ST-T wave abnormalities  Stress Procedure:  The patient received IV Lexiscan 0.4 mg over 15-seconds with concurrent low level exercise and then Technetium 72m  Sestamibi was injected at 30-seconds while the patient continued walking one more minute.  Quantitative spect images were obtained after a 45-minute delay.  During the infusion of Lexiscan the patient complained of feeling weird, SOB, weakness , head and chest feeling full.  These symptoms began to subside in recovery.  Stress ECG: No significant change from baseline ECG  QPS Raw Data Images:  Normal; no motion artifact; normal heart/lung ratio. Stress Images:  There is decreased perfusion in the entire anterior wall, and in basal and mid inferior wall.  Rest Images:  Normal homogeneous uptake in all areas of the myocardium. Subtraction (SDS):  There is medium size moderate severity perfusion defect in the anterior wall (SDS 4). There is also a small size moderate severity perfusion defect in the basal and mid inferior wall (SDS 3).   Transient Ischemic Dilatation (Normal <1.22):  1.06 Lung/Heart Ratio (Normal <0.45):  0.25  Quantitative Gated Spect Images QGS EDV:  129 ml QGS ESV:  58 ml  Impression Exercise Capacity:  Lexiscan with low level exercise. BP Response:  Normal blood pressure response. Clinical Symptoms:  Mild chest pain/dyspnea. ECG Impression:  No significant ST segment change suggestive of ischemia. Comparison with Prior Nuclear Study: No images to compare  Overall Impression:  High risk stress nuclear study with two reversible perfusion defects: a medium size moderate severity defect in  the LAD territory and a small size moderate severity defect in the RCA territory. The inferior defect might be a result of significant extracardiac activity.   LV Ejection Fraction: 55%.  LV Wall Motion:  NL LV Function; NL Wall Motion   Blake Carter 01/01/2014

## 2014-01-08 ENCOUNTER — Encounter: Payer: Self-pay | Admitting: Cardiology

## 2014-01-08 ENCOUNTER — Ambulatory Visit (INDEPENDENT_AMBULATORY_CARE_PROVIDER_SITE_OTHER): Payer: BC Managed Care – PPO | Admitting: Cardiology

## 2014-01-08 VITALS — BP 132/84 | HR 51 | Ht 71.0 in | Wt 264.0 lb

## 2014-01-08 DIAGNOSIS — I2510 Atherosclerotic heart disease of native coronary artery without angina pectoris: Secondary | ICD-10-CM

## 2014-01-08 DIAGNOSIS — I77810 Thoracic aortic ectasia: Secondary | ICD-10-CM

## 2014-01-08 DIAGNOSIS — I10 Essential (primary) hypertension: Secondary | ICD-10-CM

## 2014-01-08 DIAGNOSIS — E785 Hyperlipidemia, unspecified: Secondary | ICD-10-CM

## 2014-01-08 NOTE — Progress Notes (Signed)
[place], [place], [place]  [address] Phone: [phone] Fax:  [phone]  Date:  01/08/2014   ID:  Blake Carter, DOB 1951-03-07, MRN [id number]  PCP:  Gennette Pac, MD  Cardiologist:  Fransico Him, MD     History of Present Illness: This is a 63yo male with a history of HTN, dyslipidemia and dilated aortic root who recently underwent chest CT angio for additional measurement of dilated aortic root that had recently increased in size from last assessment of 3.6cm to 4.1cm.  Chest CT also showed coronary artery calcifications so a nuclear stress test was done which showed a high risk stress nuclear study with two reversible perfusion defects: a medium size moderate severity defect in the LAD territory and a small size moderate severity defect in the RCA territory. The inferior defect might be a result of significant extracardiac activity. LV Ejection Fraction: 55%. NL LV Function; NL Wall Motion. He now presents to discuss the results.  He denies any chest pain, SOB, DOE, LE edema, dizziness, palpitations or syncope.  He walks on a treadmill for 60 minutes without any problems.    Wt Readings from Last 3 Encounters:  01/08/14 264 lb (119.75 kg)  01/01/14 256 lb (116.121 kg)  12/12/13 262 lb (118.842 kg)     Past Medical History  Diagnosis Date  . Hypertension   . Hyperlipidemia   . Environmental allergies     dry cough   . OCD (obsessive compulsive disorder)   . PONV (postoperative nausea and vomiting)     after thyroid surgery  . SUI (stress urinary incontinence), male     S/P ROBOTIC PROSTATECTOMY AND RADIATION TX  . Sleep apnea 11/23/11     STOP BANG SCORE OF 4  . Thyroid disease   . Depression   . Panic disorder   . GERD (gastroesophageal reflux disease)   . Allergic rhinitis   . ED (erectile dysfunction)   . Cancer     prostate cancer- robotic prostatectomy - followed by radiation because of positive lymph node--and pt on lupron injections     Current Outpatient Prescriptions  Medication Sig Dispense Refill  . amLODipine (NORVASC) 5 MG tablet Take 5 mg by mouth every morning.      Marland Kitchen aspirin EC 81 MG tablet Take 81 mg by mouth daily.      Marland Kitchen atorvastatin (LIPITOR) 20 MG tablet Take 20 mg by mouth every morning.      . diazepam (VALIUM) 5 MG tablet Take 5 mg by mouth every 6 (six) hours as needed for anxiety.      Marland Kitchen esomeprazole (NEXIUM) 40 MG capsule Take 40 mg by mouth daily before breakfast.      . fluvoxaMINE (LUVOX) 50 MG tablet Take 150 mg by mouth every morning.       Marland Kitchen gemfibrozil (LOPID) 600 MG tablet Take 600 mg by mouth daily.       Marland Kitchen levothyroxine (SYNTHROID, LEVOTHROID) 125 MCG tablet Take 1 tablet (125 mcg total) by mouth daily before breakfast.  30 tablet  5  . lisinopril (PRINIVIL,ZESTRIL) 20 MG tablet Take 10 mg by mouth every morning.       . zolpidem (AMBIEN) 10 MG tablet Take 10 mg by mouth at bedtime as needed for sleep.        No current facility-administered medications for this visit.    Allergies:    Allergies  Allergen Reactions  . Niacin And Related Other (See Comments)  unknown  . Tagamet [Cimetidine] Other (See Comments)    unknown    Social History:  The patient  reports that he has never smoked. He does not have any smokeless tobacco history on file. He reports that he drinks alcohol. He reports that he does not use illicit drugs.   Family History:  The patient's family history includes Cancer in his father; Heart disease in his father; Hyperlipidemia in his father.   ROS:  Please see the history of present illness.      All other systems reviewed and negative.   PHYSICAL EXAM: VS:  BP 132/84  Pulse 51  Ht 5\' 11"  (1.803 m)  Wt 264 lb (119.75 kg)  BMI 36.84 kg/m2  SpO2 96% Well nourished, well developed, in no acute distress HEENT: normal Neck: no JVD Cardiac:  normal S1, S2; RRR; no murmur Lungs:  clear to auscultation bilaterally, no wheezing, rhonchi or rales Abd: soft,  nontender, no hepatomegaly Ext: no edema Skin: warm and dry Neuro:  CNs 2-12 intact, no focal abnormalities noted       ASSESSMENT AND PLAN:  1. Coronary artery calcifications on chest CT angio 2. Abnormal nuclear stress test - I have reviewed the findings of the nuclear stress test with several colleagues.  It was read out as large reversible defects in the anterior and inferior walls.  The inferior defect appears fixed.  The anterior defect is actually only mild and but is reversible.  He had a similar defect on his nuclear stress test a year ago when he had it done for atypical CP.  His anterior defect is subtle and could be explained by variations in soft tissue attenuation between rest and stress images given his underlying obesity.  He is completely asymptomatic from a cardiac standpoint.  He denies any chest pain or SOB and can walk on the treadmill for an hour without any difficulties.  At this time, without any symptoms of angina, I do not think there is an indication for proceeding with cath. I have instructed him to call if he develops any chest pain or pressure, DOE, arm/neck or jaw pain or decreased exercise tolerance.  He had been on ASA but is currently off of it due to hematuria from radiation to his prostate.  I will check with his urologist to see when he can restart the ASA.  He needs aggressive risk factor modification with good BP control and lipid control. 3. HTN - controlled - continue amlodipine/lisinopril 4. Dyslipidemia - continue atorvastatin/lopid - check FLP and ALT  Followup with me in 3 months  Signed, Fransico Him, MD 01/08/2014 4:56 PM

## 2014-01-08 NOTE — Patient Instructions (Signed)
Your physician recommends that you continue on your current medications as directed. Please refer to the Current Medication list given to you today.  Your physician recommends that you return for a FASTING lipid profile and ALT. Please schedule before leaving today.  Your physician recommends that you schedule a follow-up appointment in: 3 months with Dr Radford Pax

## 2014-01-14 ENCOUNTER — Other Ambulatory Visit (INDEPENDENT_AMBULATORY_CARE_PROVIDER_SITE_OTHER): Payer: BC Managed Care – PPO

## 2014-01-14 DIAGNOSIS — E785 Hyperlipidemia, unspecified: Secondary | ICD-10-CM

## 2014-01-14 LAB — LIPID PANEL
Cholesterol: 137 mg/dL (ref 0–200)
HDL: 30.7 mg/dL — ABNORMAL LOW (ref >39.00)
LDL Cholesterol: 77 mg/dL (ref 0–99)
NonHDL: 106.3
Total CHOL/HDL Ratio: 4
Triglycerides: 148 mg/dL (ref 0.0–149.0)
VLDL: 29.6 mg/dL (ref 0.0–40.0)

## 2014-01-14 LAB — ALT: ALT: 16 U/L (ref 0–53)

## 2014-01-17 ENCOUNTER — Ambulatory Visit: Payer: BC Managed Care – PPO | Admitting: Cardiology

## 2014-01-18 ENCOUNTER — Other Ambulatory Visit: Payer: Self-pay | Admitting: General Surgery

## 2014-01-18 DIAGNOSIS — I2510 Atherosclerotic heart disease of native coronary artery without angina pectoris: Secondary | ICD-10-CM

## 2014-01-18 MED ORDER — ATORVASTATIN CALCIUM 40 MG PO TABS: 40.0000 mg | ORAL_TABLET | Freq: Every morning | ORAL | Status: DC

## 2014-01-18 NOTE — Telephone Encounter (Signed)
Rx sent in for pt. Med list updated and labs ordered for pt

## 2014-01-26 ENCOUNTER — Encounter: Payer: Self-pay | Admitting: Cardiology

## 2014-02-07 ENCOUNTER — Other Ambulatory Visit: Payer: Self-pay | Admitting: Urology

## 2014-02-07 ENCOUNTER — Other Ambulatory Visit (HOSPITAL_COMMUNITY): Payer: Self-pay | Admitting: Anesthesiology

## 2014-02-07 ENCOUNTER — Encounter (HOSPITAL_COMMUNITY): Payer: Self-pay | Admitting: Pharmacy Technician

## 2014-02-07 NOTE — Patient Instructions (Addendum)
Blake Carter  02/07/2014   Your procedure is scheduled on: Monday September 14th, 2015  Report to [facility] Main Entrance and follow signs to  [place] at 1230 PM  Call this number if you have problems the morning of surgery [phone]   Remember:  Do not eat food  :After Midnight.   clear liquids midnight until 830 am day of surgery, nothing by mouth after 830 am day of surgery   Take these medicines the morning of surgery with A SIP OF WATER: amlodipine, lipitor, valium if needed, nexium, synthroid, lopid, luvox                               You may not have any metal on your body including hair pins and piercings  Do not wear jewelry, make-up, lotions, powders, or deodorant.   Men may shave face and neck.  Do not bring valuables to the hospital. [place].  Contacts, dentures or bridgework may not be worn into surgery.     Patients discharged the day of surgery will not be allowed to drive home.  Name and phone number of your driver: Clarene Critchley [phone]  ________________________________________________________________________  [facility] - Preparing for Surgery Before surgery, you can play an important role.  Because skin is not sterile, your skin needs to be as free of germs as possible.  You can reduce the number of germs on your skin by washing with CHG (chlorahexidine gluconate) soap before surgery.  CHG is an antiseptic cleaner which kills germs and bonds with the skin to continue killing germs even after washing. Please DO NOT use if you have an allergy to CHG or antibacterial soaps.  If your skin becomes reddened/irritated stop using the CHG and inform your nurse when you arrive at Short Stay. Do not shave (including legs and underarms) for at least 48 hours prior to the first CHG shower.  You may shave your face/neck. Please follow these instructions carefully:  1.  Shower with CHG Soap the night before surgery and  the  morning of Surgery.  2.  If you choose to wash your hair, wash your hair first as usual with your  normal  shampoo.  3.  After you shampoo, rinse your hair and body thoroughly to remove the  shampoo.                            4.  Use CHG as you would any other liquid soap.  You can apply chg directly  to the skin and wash                       Gently with a scrungie or clean washcloth.  5.  Apply the CHG Soap to your body ONLY FROM THE NECK DOWN.   Do not use on face/ open                           Wound or open sores. Avoid contact with eyes, ears mouth and genitals (private parts).                       Wash face,  Genitals (private parts) with your normal soap.             6.  Wash thoroughly, paying special attention to the area where your surgery  will be performed.  7.  Thoroughly rinse your body with warm water from the neck down.  8.  DO NOT shower/wash with your normal soap after using and rinsing off  the CHG Soap.                9.  Pat yourself dry with a clean towel.            10.  Wear clean pajamas.            11.  Place clean sheets on your bed the night of your first shower and do not  sleep with pets. Day of Surgery : Do not apply any lotions/deodorants the morning of surgery.  Please wear clean clothes to the hospital/surgery center.  FAILURE TO FOLLOW THESE INSTRUCTIONS MAY RESULT IN THE CANCELLATION OF YOUR SURGERY PATIENT SIGNATURE_________________________________  NURSE SIGNATURE__________________________________  ________________________________________________________________________    CLEAR LIQUID DIET   Foods Allowed                                                                     Foods Excluded  Coffee and tea, regular and decaf                             liquids that you cannot  Plain Jell-O in any flavor                                             see through such as: Fruit ices (not with fruit pulp)                                     milk,  soups, orange juice  Iced Popsicles                                    All solid food Carbonated beverages, regular and diet                                    Cranberry, grape and apple juices Sports drinks like Gatorade Lightly seasoned clear broth or consume(fat free) Sugar, honey syrup  Sample Menu Breakfast                                Lunch                                     Supper Cranberry juice                    Beef broth  Chicken broth Jell-O                                     Grape juice                           Apple juice Coffee or tea                        Jell-O                                      Popsicle                                                Coffee or tea                        Coffee or tea  _____________________________________________________________________

## 2014-02-07 NOTE — Progress Notes (Signed)
lov dr turner cardiology 01-08-14 epic Myocardial perfusion 01-01-14 epic Echo 11-27-13 epic Chest ct 12-11-13 epic

## 2014-02-08 ENCOUNTER — Encounter (HOSPITAL_COMMUNITY): Payer: Self-pay

## 2014-02-08 ENCOUNTER — Encounter (HOSPITAL_COMMUNITY)
Admission: RE | Admit: 2014-02-08 | Discharge: 2014-02-08 | Disposition: A | Discharge status: Home / Self Care | Payer: BC Managed Care – PPO | Source: Ambulatory Visit | Attending: Urology | Admitting: Urology

## 2014-02-08 DIAGNOSIS — Z7982 Long term (current) use of aspirin: Secondary | ICD-10-CM | POA: Diagnosis not present

## 2014-02-08 DIAGNOSIS — E785 Hyperlipidemia, unspecified: Secondary | ICD-10-CM | POA: Diagnosis not present

## 2014-02-08 DIAGNOSIS — N139 Obstructive and reflux uropathy, unspecified: Secondary | ICD-10-CM | POA: Diagnosis not present

## 2014-02-08 DIAGNOSIS — R310 Gross hematuria: Secondary | ICD-10-CM | POA: Diagnosis not present

## 2014-02-08 DIAGNOSIS — N393 Stress incontinence (female) (male): Secondary | ICD-10-CM | POA: Diagnosis not present

## 2014-02-08 DIAGNOSIS — F429 Obsessive-compulsive disorder, unspecified: Secondary | ICD-10-CM | POA: Diagnosis not present

## 2014-02-08 DIAGNOSIS — Z8546 Personal history of malignant neoplasm of prostate: Secondary | ICD-10-CM | POA: Diagnosis not present

## 2014-02-08 DIAGNOSIS — F411 Generalized anxiety disorder: Secondary | ICD-10-CM | POA: Diagnosis not present

## 2014-02-08 DIAGNOSIS — Z79899 Other long term (current) drug therapy: Secondary | ICD-10-CM | POA: Diagnosis not present

## 2014-02-08 DIAGNOSIS — N1330 Unspecified hydronephrosis: Secondary | ICD-10-CM | POA: Diagnosis present

## 2014-02-08 DIAGNOSIS — N2889 Other specified disorders of kidney and ureter: Secondary | ICD-10-CM | POA: Diagnosis not present

## 2014-02-08 DIAGNOSIS — Z888 Allergy status to other drugs, medicaments and biological substances status: Secondary | ICD-10-CM | POA: Diagnosis not present

## 2014-02-08 DIAGNOSIS — I10 Essential (primary) hypertension: Secondary | ICD-10-CM | POA: Diagnosis not present

## 2014-02-08 DIAGNOSIS — N529 Male erectile dysfunction, unspecified: Secondary | ICD-10-CM | POA: Diagnosis not present

## 2014-02-08 DIAGNOSIS — E291 Testicular hypofunction: Secondary | ICD-10-CM | POA: Diagnosis not present

## 2014-02-08 DIAGNOSIS — K219 Gastro-esophageal reflux disease without esophagitis: Secondary | ICD-10-CM | POA: Diagnosis not present

## 2014-02-08 DIAGNOSIS — R32 Unspecified urinary incontinence: Secondary | ICD-10-CM | POA: Diagnosis not present

## 2014-02-08 LAB — BASIC METABOLIC PANEL
Anion gap: 12 (ref 5–15)
BUN: 14 mg/dL (ref 6–23)
CO2: 28 mEq/L (ref 19–32)
Calcium: 9.2 mg/dL (ref 8.4–10.5)
Chloride: 98 mEq/L (ref 96–112)
Creatinine, Ser: 1.34 mg/dL (ref 0.50–1.35)
GFR calc Af Amer: 64 mL/min — ABNORMAL LOW (ref >90)
GFR calc non Af Amer: 56 mL/min — ABNORMAL LOW (ref >90)
Glucose, Bld: 92 mg/dL (ref 70–99)
Potassium: 4.6 mEq/L (ref 3.7–5.3)
Sodium: 138 mEq/L (ref 137–147)

## 2014-02-08 LAB — CBC
HCT: 39.6 % (ref 39.0–52.0)
Hemoglobin: 12.8 g/dL — ABNORMAL LOW (ref 13.0–17.0)
MCH: 28.6 pg (ref 26.0–34.0)
MCHC: 32.3 g/dL (ref 30.0–36.0)
MCV: 88.6 fL (ref 78.0–100.0)
Platelets: 246 10*3/uL (ref 150–400)
RBC: 4.47 MIL/uL (ref 4.22–5.81)
RDW: 13.6 % (ref 11.5–15.5)
WBC: 5 10*3/uL (ref 4.0–10.5)

## 2014-02-08 NOTE — H&P (Signed)
History of Present Illness Blake Carter is a 63 year with the following urologic history:    1) Lymph node positive prostate cancer: He is s/p initial treatment with a BNS RAL radical prostatectomy on 06/08/10. His PSA was detectable postoperatively and he elected to proceed with adjuvant radiation (May-June 2012) under the care of Dr. Valere Dross and androgen deprivation therapy (for a minimum of 2 years). His PSA became undetectable after starting ADT. He completed 2 years of ADT.    Diagnosis: pT3a N1 Mx, Gleason 4+3=7 (tertiary pattern 5) with multiple positive margins. 1/6 lymph nodes positive.   Pretreatment PSA: 3.95  Pretreatment SHIM: 20 (occasionally took Viagra pre-op)    2) Testosterone deficiency / bone health: He take vitamin D and calcium supplementation for osteoporosis prevention.  Last Lupron: 02/23/12 (45 mg)  Last DXA: March 2012 (normal)    3) Incontinence: He had almost regained full continence after his radical prostatectomy but developed progressive incontinence after his radiation therapy. He ultimately underwent artificial urinary sphincter placement by Dr. Matilde Sprang on 11/30/11.    4) Hematuria: He developed gross hematuria in April 2014. He underwent cystoscopy at that time with findings that were indeterminate but most consistent with radiation changes and inflammation. Follow up cystoscopy in August 2014 demonstrated resolution of the concerning cystoscopic lesion indicating it was likely related to his radiation treatment but not a malignant change. He again developed gross hematuria in July 2015. Cystoscopic evaluation was consistent with radiation changes and cytology was negative.    Interval history:    He follows up today for further evaluation and surveillance of his lymph node-positive prostate cancer. He has not had any further gross hematuria over the past month. He continues to maintain good continence related to his artificial urinary  sphincter. Erectile function has been a lower priority as his wife has not been particularly interested due to her busy work schedule. Overall, he feels good and has no specific complaints today.     Past Medical History Problems  1. History of Anxiety (300.00) 2. History of esophageal reflux (V12.79) 3. History of hyperlipidemia (V12.29) 4. History of hypertension (V12.59) 5. 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 Prostate cancer (185)  Surgical History Problems  1. History of Correct Urin Incont: Placement Inflat Bladder Neck Sphincter 2. History of Prostatectomy Robotic-Assisted 3. History of Thyroid Surgery 4. History of Tonsillectomy  Current Meds 1. AmLODIPine Besylate 5 MG Oral Tablet;  Therapy: (Recorded:18Oct2011) to Recorded 2. Anusol-HC 2.5 % CREA;  Therapy: (Recorded:23Apr2014) to Recorded 3. Aspirin 81 MG Oral Tablet;  Therapy: (Recorded:18Oct2011) to Recorded 4. Diazepam 5 MG Oral Tablet;  Therapy: 21Feb2014 to Recorded 5. FluvoxaMINE Maleate 50 MG Oral Tablet;  Therapy: (Recorded:18Oct2011) to Recorded 6. Gemfibrozil 600 MG Oral Tablet;  Therapy: (Recorded:18Oct2011) to Recorded 7. Ibuprofen 800 MG Oral Tablet;  Therapy: [phone] to Recorded 8. Levothyroxine Sodium 125 MCG Oral Tablet;  Therapy: 14Apr2015 to Recorded 9. Lipitor 20 MG Oral Tablet;  Therapy: (Recorded:18Oct2011) to Recorded 10. Lisinopril 20 MG Oral Tablet;   Therapy: (Recorded:18Oct2011) to Recorded 11. Multi-Vitamin TABS;   Therapy: (Recorded:18Oct2011) to Recorded 12. NexIUM 40 MG Oral Capsule Delayed Release;   Therapy: (Recorded:18Oct2011) to Recorded 13. Toprol XL 25 MG Oral Tablet Extended Release 24 Hour;   Therapy: [id number] to Recorded 14. Trimix (PGE 20, PAP 30, PHE 0.5); Please give patient a 79m test dose of Trimix (PGE   231m, PAP 30 mg, Phentolamine 0.23m61m   Therapy: [phone] (Last Rx:15Aug2014)  Requested for:  [id number] Ordered 15. Trimix  (PGE 20, PAP 30, PHE 0.5); Trimix (PGE 20 mcg, Papaverine 30 mg,   Phentolamine 0.5 mg).  Please give patient 5 mL vial.  Please provide syringes and   alcohol swabs;   Therapy: [phone] to (Last Rx:16Oct2014)  Requested for: 16Oct2014 Ordered 16. Zolpidem Tartrate 10 MG Oral Tablet;   Therapy: [id number] to Recorded  Allergies Medication  1. Niacin TABS 2. Tagamet TABS  Family History Problems  1. Family history of Acute Myocardial Infarction (V17.3) : Father 2. Family history of Brain Cancer (V16.8) : Mother 3. Family history of Lung Cancer (V16.1) : Father 4. Denied: Family history of Prostate Cancer  Social History Problems  1. Alcohol Use   occ social use avg once a mth 2. Marital History - Currently Married 3. Never A Smoker 4. Occupation:   professor 5. Denied: History of Tobacco Use  Vitals Vital Signs [Data Includes: Last 1 Day]  Recorded: 26Aug2015 02:58PM  Blood Pressure: 129 / 75 Heart Rate: 70  Physical Exam Constitutional: Well nourished and well developed . No acute distress.    Results/Data Urine [Data Includes: Last 1 Day]   26Aug2015  COLOR YELLOW   APPEARANCE CLEAR   SPECIFIC GRAVITY 1.010   pH 7.0   GLUCOSE NEG mg/dL  BILIRUBIN NEG   KETONE NEG mg/dL  BLOOD MOD   PROTEIN NEG mg/dL  UROBILINOGEN 0.2 mg/dL  NITRITE NEG   LEUKOCYTE ESTERASE NEG   SQUAMOUS EPITHELIAL/HPF RARE   WBC 0-2 WBC/hpf  RBC 3-6 RBC/hpf  BACTERIA RARE   CRYSTALS NONE SEEN   CASTS NONE SEEN   Selected Results  PSA [id number] 08:14AM Raynelle Bring  SPECIMEN TYPE: BLOOD   Test Name Result Flag Reference  PSA 0.02 ng/mL  <=4.00  RESULT REPEATED AND VERIFIED. TEST METHODOLOGY: ECLIA PSA (ELECTROCHEMILUMINESCENCE IMMUNOASSAY)   Assessment Assessed  1. Prostate cancer (185) 2. Male stress incontinence (788.32) 3. Gross hematuria (599.71) 4. 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 UA With REFLEX; [Do Not  Release]; Status:Complete;   Done: [id number] 02:39PM Prostate cancer  2. Follow-up Month x 6 Office  Follow-up  Status: Hold For - Appointment,Date of Service   Requested for: 26Aug2015 3. PSA; Status:Hold For - Specimen/Data Collection,Appointment; Requested  for:26Aug2015;   Discussion/Summary 1. Lymph node-positive prostate cancer: His PSA remains undetectable. He will follow up in 6 months for continued surveillance.    2. 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 If he does have recurrent significant hematuria, he will notify me and we will consider upper tract imaging.    3. Incontinence: He continues to have a very satisfactory result from his artificial urinary sphincter.    4. Erectile dysfunction: He will notify me if this becomes a bigger priority. He understands that he does have the option of utilizing Trimix prn.    Cc: Dr. Hulan Fess  Dr. Arloa Koh     Verified Results CREATININE with eGFR1 [id number] 03:39PM1 Read Drivers  SPECIMEN TYPE: BLOOD  [Feb 06, 2014 9:11PM Lleyton Byers] I spoke with Blake Carter tonight regarding his CT findings which demonstrated right hydronephrosis with evidence of obstruction down to the right ureterovesical junction due to an unknown etiology. I recommended that we proceed with cystoscopy, right retrograde pyelography, and right ureteroscopy with possible biopsies and right ureteral stent placement. He does have an artificial urinary sphincter which will require deactivation. I will perform cystoscopy with a flexible cystoscope and  will avoid rigid cystoscopy. The potential risks, complications, and alternative options as well as 6 every been discussed in detail. Informed consent has been obtained.   Test Name Result Flag Reference  CREATININE1 1.40 mg/dL1  0.50-1.501  Est GFR, African American1 62 mL/min1    PERFORMED AT:        [facility]                      [address]                      [place], [place] [address]  Est GFR, NonAfrican American1 54 mL/min1 L1   THE ESTIMATED GFR IS A CALCULATION VALID FOR ADULTS (>=9 YEARS OLD) THAT USES THE CKD-EPI ALGORITHM TO ADJUST FOR AGE AND SEX. IT IS   NOT TO BE USED FOR CHILDREN, PREGNANT WOMEN, HOSPITALIZED PATIENTS,    PATIENTS ON DIALYSIS, OR WITH RAPIDLY CHANGING KIDNEY FUNCTION. ACCORDING TO THE NKDEP, EGFR >89 IS NORMAL, 60-89 SHOWS MILD IMPAIRMENT, 30-59 SHOWS MODERATE IMPAIRMENT, 15-29 SHOWS SEVERE IMPAIRMENT AND <15 IS ESRD.   AU CT-HEMATURIA PROTOCOL1 [id number] 12:00AM1 Read Drivers  [Feb 06, 2014 9:11PM Alinda Money, Kelson Queenan] I spoke with Blake Carter tonight regarding his CT findings which demonstrated right hydronephrosis with evidence of obstruction down to the right ureterovesical junction due to an unknown etiology. I recommended that we proceed with cystoscopy, right retrograde pyelography, and right ureteroscopy with possible biopsies and right ureteral stent placement. He does have an artificial urinary sphincter which will require deactivation. I will perform cystoscopy with a flexible cystoscope and will avoid rigid cystoscopy. The potential risks, complications, and alternative options as well as 6 every been discussed in detail. Informed consent has been obtained.   Test Name Result Flag Reference  AU CT-HEMATURIA PROTOCOL1 (Report)1    ** RADIOLOGY REPORT BY [facility] **   CLINICAL DATA: Prostate cancer.  EXAM: CT ABDOMEN AND PELVIS WITHOUT AND WITH CONTRAST  TECHNIQUE: Multidetector CT imaging of the abdomen and pelvis was performed following the standard protocol before and following the bolus administration of intravenous contrast.  CONTRAST: 125 cc of Isovue 370  COMPARISON: 06/25/2010  FINDINGS: No pleural effusions. Calcification within the LAD and left circumflex coronary artery noted. There is no focal liver abnormality. The gallbladder appears normal. No  biliary dilatation. Normal appearance of the pancreas and spleen.  The adrenal glands both appear normal. Small cyst within the upper pole of the left kidney measures 9 mm, image 39/series 3. There is asymmetric right-sided hydronephrosis and decreased enhancement of the right kidney. This is new from previous exam. Right hydroureter to the level of the right UVJ is identified. There is insufficient contrast opacification within the distal right ureter. No obstructing stone however is noted. There is mild bladder wall thickening which may in part reflect incomplete distention. This appears improved on the delayed images. No calculi identified within the bladder. The prostate gland appears surgically absent. Seminal vesicles are not visualized are also presumably surgically absent.  Normal caliber of the abdominal aorta. There is mild calcified atherosclerotic change noted. No retroperitoneal adenopathy identified. There is no pelvic or inguinal adenopathy noted. No free fluid or fluid collections within the abdomen or pelvis. The patient has a penile prosthesis with right lower quadrant reservoir.  The stomach is normal. The small bowel loops have a normal course and caliber without evidence for bowel obstruction. Normal appearance of the colon.  Review of  the visualized osseous structures is significant for mild lumbar spondylosis.  IMPRESSION: 1. There is new right-sided obstructive uropathy. The level of obstruction appears to be at the UVJ. No stone is identified. Cannot rule out underlying the urothelial neoplasm. 2. No evidence for prostate cancer metastasis. Specifically, no evidence for adenopathy within the abdomen or pelvis. No evidence for sclerotic bone metastasis. 3. These results will be called to the ordering clinician or representative by the Radiologist Assistant, and communication documented in the PACS or zVision Dashboard. 4. Atherosclerotic disease including  multi vessel coronary artery calcification.   Electronically Signed  By: Kerby Moors M.D.  On: 02/06/2014 17:57     1. Amended By: Raynelle Bring; Feb 06 2014 9:11 PM EST  Signatures Electronically signed by : Raynelle Bring, M.D.; Feb 06 2014  9:11PM EST

## 2014-02-08 NOTE — Progress Notes (Signed)
02/08/14 0811  OBSTRUCTIVE SLEEP APNEA  Have you ever been diagnosed with sleep apnea through a sleep study? No  Do you snore loudly (loud enough to be heard through closed doors)?  0  Do you often feel tired, fatigued, or sleepy during the daytime? 0  Has anyone observed you stop breathing during your sleep? 0  Do you have, or are you being treated for high blood pressure? 1  BMI more than 35 kg/m2? 1  Age over 62 years old? 1  Neck circumference greater than 40 cm/16 inches? 1  Gender: 1  Obstructive Sleep Apnea Score 5  Score 4 or greater  Results sent to PCP

## 2014-02-10 NOTE — Anesthesia Preprocedure Evaluation (Addendum)
Anesthesia Evaluation  Patient identified by MRN, date of birth, ID band Patient awake    Reviewed: Allergy & Precautions, H&P , NPO status , Patient's Chart, lab work & pertinent test results  History of Anesthesia Complications (+) PONV and history of anesthetic complications  Airway Mallampati: II TM Distance: >3 FB Neck ROM: Full    Dental no notable dental hx.    Pulmonary neg pulmonary ROS,  breath sounds clear to auscultation  Pulmonary exam normal       Cardiovascular hypertension, Pt. on medications + CAD and + Peripheral Vascular Disease negative cardio ROS  Rhythm:Regular Rate:Normal  Hx of abnormal nuclear stress test but reviewed by Dr. Radford Pax and no further action needed at this time, patient completely asymptomatic   Neuro/Psych Anxiety Depression OCDnegative neurological ROS  negative psych ROS   GI/Hepatic Neg liver ROS, GERD-  Medicated and Controlled,  Endo/Other  Hypothyroidism   Renal/GU negative Renal ROS  negative genitourinary   Musculoskeletal negative musculoskeletal ROS (+)   Abdominal   Peds negative pediatric ROS (+)  Hematology negative hematology ROS (+)   Anesthesia Other Findings   Reproductive/Obstetrics negative OB ROS                          Anesthesia Physical Anesthesia Plan  ASA: III  Anesthesia Plan: General   Post-op Pain Management:    Induction: Intravenous  Airway Management Planned: LMA  Additional Equipment:   Intra-op Plan:   Post-operative Plan: Extubation in OR  Informed Consent: I have reviewed the patients History and Physical, chart, labs and discussed the procedure including the risks, benefits and alternatives for the proposed anesthesia with the patient or authorized representative who has indicated his/her understanding and acceptance.   Dental advisory given  Plan Discussed with: CRNA  Anesthesia Plan Comments:         Anesthesia Quick Evaluation

## 2014-02-11 ENCOUNTER — Encounter (HOSPITAL_COMMUNITY): Payer: Self-pay | Admitting: *Deleted

## 2014-02-11 ENCOUNTER — Ambulatory Visit (HOSPITAL_COMMUNITY): Payer: BC Managed Care – PPO | Admitting: Anesthesiology

## 2014-02-11 ENCOUNTER — Encounter (HOSPITAL_COMMUNITY): Payer: BC Managed Care – PPO | Admitting: Anesthesiology

## 2014-02-11 ENCOUNTER — Encounter (HOSPITAL_COMMUNITY)
Admission: RE | Disposition: A | Discharge status: Home / Self Care | Payer: Self-pay | Source: Ambulatory Visit | Attending: Urology

## 2014-02-11 ENCOUNTER — Ambulatory Visit (HOSPITAL_COMMUNITY)
Admission: RE | Admit: 2014-02-11 | Discharge: 2014-02-11 | Disposition: A | Discharge status: Home / Self Care | Payer: BC Managed Care – PPO | Source: Ambulatory Visit | Attending: Urology | Admitting: Urology

## 2014-02-11 DIAGNOSIS — N2889 Other specified disorders of kidney and ureter: Secondary | ICD-10-CM | POA: Insufficient documentation

## 2014-02-11 DIAGNOSIS — I10 Essential (primary) hypertension: Secondary | ICD-10-CM | POA: Insufficient documentation

## 2014-02-11 DIAGNOSIS — Z79899 Other long term (current) drug therapy: Secondary | ICD-10-CM | POA: Insufficient documentation

## 2014-02-11 DIAGNOSIS — N529 Male erectile dysfunction, unspecified: Secondary | ICD-10-CM | POA: Insufficient documentation

## 2014-02-11 DIAGNOSIS — R310 Gross hematuria: Secondary | ICD-10-CM | POA: Insufficient documentation

## 2014-02-11 DIAGNOSIS — Z8546 Personal history of malignant neoplasm of prostate: Secondary | ICD-10-CM | POA: Insufficient documentation

## 2014-02-11 DIAGNOSIS — R32 Unspecified urinary incontinence: Secondary | ICD-10-CM | POA: Insufficient documentation

## 2014-02-11 DIAGNOSIS — N1330 Unspecified hydronephrosis: Secondary | ICD-10-CM | POA: Insufficient documentation

## 2014-02-11 DIAGNOSIS — E291 Testicular hypofunction: Secondary | ICD-10-CM | POA: Insufficient documentation

## 2014-02-11 DIAGNOSIS — E785 Hyperlipidemia, unspecified: Secondary | ICD-10-CM | POA: Insufficient documentation

## 2014-02-11 DIAGNOSIS — N139 Obstructive and reflux uropathy, unspecified: Secondary | ICD-10-CM | POA: Insufficient documentation

## 2014-02-11 DIAGNOSIS — F411 Generalized anxiety disorder: Secondary | ICD-10-CM | POA: Insufficient documentation

## 2014-02-11 DIAGNOSIS — Z7982 Long term (current) use of aspirin: Secondary | ICD-10-CM | POA: Insufficient documentation

## 2014-02-11 DIAGNOSIS — N393 Stress incontinence (female) (male): Secondary | ICD-10-CM | POA: Insufficient documentation

## 2014-02-11 DIAGNOSIS — F429 Obsessive-compulsive disorder, unspecified: Secondary | ICD-10-CM | POA: Insufficient documentation

## 2014-02-11 DIAGNOSIS — K219 Gastro-esophageal reflux disease without esophagitis: Secondary | ICD-10-CM | POA: Insufficient documentation

## 2014-02-11 DIAGNOSIS — Z888 Allergy status to other drugs, medicaments and biological substances status: Secondary | ICD-10-CM | POA: Insufficient documentation

## 2014-02-11 HISTORY — PX: CYSTOSCOPY WITH RETROGRADE PYELOGRAM, URETEROSCOPY AND STENT PLACEMENT: SHX5789

## 2014-02-11 SURGERY — CYSTOURETEROSCOPY, WITH RETROGRADE PYELOGRAM AND STENT INSERTION
Anesthesia: General | Site: Ureter | Laterality: Right

## 2014-02-11 MED ORDER — HYDROCODONE-ACETAMINOPHEN 5-325 MG PO TABS: 1.0000 | ORAL_TABLET | Freq: Four times a day (QID) | ORAL | Status: DC | PRN

## 2014-02-11 MED ORDER — DEXAMETHASONE SODIUM PHOSPHATE 10 MG/ML IJ SOLN
INTRAMUSCULAR | Status: AC
  Filled 2014-02-11: qty 1

## 2014-02-11 MED ORDER — MIDAZOLAM HCL 5 MG/5ML IJ SOLN
INTRAMUSCULAR | Status: DC | PRN
  Administered 2014-02-11 (×2): 1 mg via INTRAVENOUS

## 2014-02-11 MED ORDER — MIDAZOLAM HCL 2 MG/2ML IJ SOLN
INTRAMUSCULAR | Status: AC
  Filled 2014-02-11: qty 2

## 2014-02-11 MED ORDER — CIPROFLOXACIN IN D5W 400 MG/200ML IV SOLN
INTRAVENOUS | Status: AC
  Filled 2014-02-11: qty 200

## 2014-02-11 MED ORDER — LIDOCAINE HCL 2 % EX GEL
CUTANEOUS | Status: AC
  Filled 2014-02-11: qty 10

## 2014-02-11 MED ORDER — IOHEXOL 300 MG/ML  SOLN
INTRAMUSCULAR | Status: DC | PRN
  Administered 2014-02-11: 20 mL via INTRAVENOUS

## 2014-02-11 MED ORDER — PROPOFOL 10 MG/ML IV BOLUS
INTRAVENOUS | Status: DC | PRN
  Administered 2014-02-11: 200 mg via INTRAVENOUS

## 2014-02-11 MED ORDER — LACTATED RINGERS IV SOLN
INTRAVENOUS | Status: DC
  Administered 2014-02-11: 1000 mL via INTRAVENOUS
  Administered 2014-02-11: 17:00:00 via INTRAVENOUS

## 2014-02-11 MED ORDER — FENTANYL CITRATE 0.05 MG/ML IJ SOLN
INTRAMUSCULAR | Status: AC
  Filled 2014-02-11: qty 5

## 2014-02-11 MED ORDER — CIPROFLOXACIN IN D5W 400 MG/200ML IV SOLN
400.0000 mg | INTRAVENOUS | Status: AC
  Administered 2014-02-11: 400 mg via INTRAVENOUS

## 2014-02-11 MED ORDER — DEXAMETHASONE SODIUM PHOSPHATE 10 MG/ML IJ SOLN
INTRAMUSCULAR | Status: DC | PRN
  Administered 2014-02-11: 10 mg via INTRAVENOUS

## 2014-02-11 MED ORDER — ONDANSETRON HCL 4 MG/2ML IJ SOLN
INTRAMUSCULAR | Status: AC
  Filled 2014-02-11: qty 2

## 2014-02-11 MED ORDER — CIPROFLOXACIN HCL 250 MG PO TABS: 250.0000 mg | ORAL_TABLET | Freq: Two times a day (BID) | ORAL | Status: DC

## 2014-02-11 MED ORDER — FENTANYL CITRATE 0.05 MG/ML IJ SOLN: 25.0000 ug | INTRAMUSCULAR | Status: DC | PRN

## 2014-02-11 MED ORDER — EPHEDRINE SULFATE 50 MG/ML IJ SOLN
INTRAMUSCULAR | Status: DC | PRN
  Administered 2014-02-11 (×2): 5 mg via INTRAVENOUS

## 2014-02-11 MED ORDER — PROPOFOL 10 MG/ML IV BOLUS
INTRAVENOUS | Status: AC
  Filled 2014-02-11: qty 20

## 2014-02-11 MED ORDER — FENTANYL CITRATE 0.05 MG/ML IJ SOLN
INTRAMUSCULAR | Status: DC | PRN
  Administered 2014-02-11 (×2): 50 ug via INTRAVENOUS

## 2014-02-11 MED ORDER — ONDANSETRON HCL 4 MG/2ML IJ SOLN
INTRAMUSCULAR | Status: DC | PRN
  Administered 2014-02-11: 4 mg via INTRAVENOUS

## 2014-02-11 MED ORDER — DOCUSATE SODIUM 100 MG PO CAPS: 100.0000 mg | ORAL_CAPSULE | Freq: Two times a day (BID) | ORAL | Status: DC

## 2014-02-11 MED ORDER — LIDOCAINE HCL (CARDIAC) 20 MG/ML IV SOLN
INTRAVENOUS | Status: DC | PRN
  Administered 2014-02-11: 75 mg via INTRAVENOUS

## 2014-02-11 MED ORDER — LIDOCAINE HCL (CARDIAC) 20 MG/ML IV SOLN
INTRAVENOUS | Status: AC
  Filled 2014-02-11: qty 5

## 2014-02-11 MED ORDER — PROMETHAZINE HCL 25 MG/ML IJ SOLN: 6.2500 mg | INTRAMUSCULAR | Status: DC | PRN

## 2014-02-11 SURGICAL SUPPLY — 19 items
BAG URO CATCHER STRL LF (DRAPE) ×2 IMPLANT
BASKET STNLS GEMINI 4WIRE 3FR (BASKET) ×2 IMPLANT
BASKET ZERO TIP NITINOL 2.4FR (BASKET) IMPLANT
CATH FOLEY 2WAY SLVR  5CC 14FR (CATHETERS) ×1
CATH FOLEY 2WAY SLVR 5CC 14FR (CATHETERS) ×1 IMPLANT
CATH INTERMIT  6FR 70CM (CATHETERS) IMPLANT
CLOTH BEACON ORANGE TIMEOUT ST (SAFETY) ×2 IMPLANT
DRAPE CAMERA CLOSED 9X96 (DRAPES) ×2 IMPLANT
GLOVE BIOGEL M STRL SZ7.5 (GLOVE) ×2 IMPLANT
GOWN STRL REUS W/TWL LRG LVL3 (GOWN DISPOSABLE) ×4 IMPLANT
GUIDEWIRE ANG ZIPWIRE 038X150 (WIRE) IMPLANT
GUIDEWIRE STR DUAL SENSOR (WIRE) ×4 IMPLANT
IV NS IRRIG 3000ML ARTHROMATIC (IV SOLUTION) ×4 IMPLANT
IV SOD CHL 0.9% 1000ML (IV SOLUTION) ×4 IMPLANT
MANIFOLD NEPTUNE II (INSTRUMENTS) ×2 IMPLANT
PACK CYSTO (CUSTOM PROCEDURE TRAY) ×2 IMPLANT
STENT CONTOUR 6FRX26X.038 (STENTS) ×2 IMPLANT
TRAY FOLEY CATH 14FRSI W/METER (CATHETERS) IMPLANT
TUBING CONNECTING 10 (TUBING) ×2 IMPLANT

## 2014-02-11 NOTE — Anesthesia Procedure Notes (Signed)
Procedure Name: LMA Insertion Date/Time: 02/11/2014 3:38 PM Performed by: Ofilia Neas Pre-anesthesia Checklist: Patient identified, Suction available, Emergency Drugs available, Timeout performed and Patient being monitored Patient Re-evaluated:Patient Re-evaluated prior to inductionOxygen Delivery Method: Circle system utilized Preoxygenation: Pre-oxygenation with 100% oxygen Intubation Type: IV induction Ventilation: Mask ventilation without difficulty LMA: LMA with gastric port inserted LMA Size: 5.0 Number of attempts: 2 (first no 4 regular LMA.  unable to get adequate seal.  changed to no. 5 proseal, good seal) Placement Confirmation: positive ETCO2 Tube secured with: Tape Dental Injury: Teeth and Oropharynx as per pre-operative assessment

## 2014-02-11 NOTE — Op Note (Signed)
Preoperative diagnosis:  1. Right hydronephrosis  Postoperative diagnosis: 1. Right hydronephrosis 2.  Distal right ureteral tumor  Procedure(s): 1. Cystoscopy 2.  Right retrograde pyelography with interpretation 3.  Right ureteroscopy with biopsy of ureteral tumor 4.  Right ureteral stent placement ( 6 x 26 - no string)  Surgeon: Dr. Roxy Horseman, Jr  Anesthesia: General  Complications: None  EBL: Minimal  Specimens: 1.  Right ureteral tumor  Disposition of specimens: Pathology  Intraoperative findings: Right retrograde pyelography demonstrated a 2 cm right distal ureteral filling defect with proximal dilation of the ureter extending up to the renal collecting system with evidence of calyceal blunting and hydronephrosis.  No filling defects were noted in real-time imaging of the renal collecting system or more proximal ureter.  Indication: This is a 63 year old gentleman with a history of prostate cancer status post radical prostatectomy and radiation therapy.  He does have an artificial urinary sphincter for treatment of stress incontinence.  He recently presented with hematuria and right-sided flank pain.  CT imaging revealed right-sided hydroureteronephrosis down to the distal right ureter without a clear etiology for the cause.  It was recommended that he undergo further evaluation endoscopically with cystoscopy, retrograde pyelography, right ureteroscopy, and possible biopsy or other intervention as necessary.  Considering his artificial urinary sphincter, he understood that this would require use of alternative instrumentation to avoid injury or damage to his artificial urinary sphincter although these risks were explained to him in addition to standard potential risks and complications associated with the planned procedure.  He gave his informed consent.  Description of procedure:  The patient was taken to the operating room and general anesthetic was administered.  He was  given preoperative antibiotics, placed in the dorsal lithotomy position, and prepped and draped in the usual sterile fashion.  Next, a preoperative timeout was performed.  His artificial urinary sphincter was then deactivated.  I then used the 6 French semirigid ureteroscope to advance through the urethra and carefully through the bulbar urethra where the artificial urinary sphincter had been placed.  The right ureteral orifice was able to be identified and it was cannulated with a 0.38 sensor guidewire.  The ureteroscope was then removed and a 6 [place] ureteral catheter was advanced over the guidewire into the proximal ureter.  Omnipaque contrast was injected which revealed a dilated right renal collecting system with calyceal blunting consistent with his preoperative hydronephrosis noted on CT imaging.  No filling defects were noted within the renal collecting system.  The ureteral catheter then was gradually brought back down to the more distal ureter.  Contrast did fill the entire ureter with the proximal and mid ureter demonstrating findings consistent with dilation but no filling defects.  There was a very obvious filling defect noted toward the lateral distal ureter.  The sensor guidewire was then replaced up into the renal pelvis and the ureteral catheter removed.  The 6 French semirigid ureteroscope was advanced into the bladder and next to the wire.  This was advanced carefully and there did appear to be a distal polypoid ureteral tumor noted.  This was not necessarily consistent with urothelial carcinoma on visual inspection.  I advanced the semirigid ureteroscope up into the mid ureter and no other abnormalities were identified.  Ureteroscope was then brought down to the distal ureter again with the tumor was again identified.  I then used a Gemini basket to grasp the ureteral tumor and it was removed in its entirety off its stalk-like projection from  the ureteral wall.  This was sent for permanent  pathologic analysis.  Reinspection with the ureteroscope revealed minimal bleeding and there did not appear to be evidence for any remaining obvious gross tumor.  It was then decided to place a ureteral stent.  A 6 x 26 double-J contour ureteral stent was advanced over the wire using Seldinger technique and positioned appropriately under fluoroscopic guidance.  The wire was then removed and there appeared to be a good curl within the renal pelvis.  The distal curl appeared to be within the bladder but not fully formed.  I therefore utilized the 43 French flexible cystoscope and carefully advanced this through the urethra and into the bladder.  Flexible graspers were then used to pull the stent down slightly resulting in adequate position of the stent.  The procedure was then ended.  A 14 French catheter was used to drain the patient's bladder.  The artificial urinary sphincter was then reactivated.  The patient appeared to tolerate the procedure well and without complications.  He was able to be awakened and transferred to the recovery unit in satisfactory condition.

## 2014-02-11 NOTE — Discharge Instructions (Signed)

## 2014-02-11 NOTE — Interval H&P Note (Signed)
History and Physical Interval Note:  02/11/2014 2:51 PM  Blake Carter  has presented today for surgery, with the diagnosis of RIGHT HYDRONEPHROSIS, HEMATURIA  The various methods of treatment have been discussed with the patient and family. After consideration of risks, benefits and other options for treatment, the patient has consented to  Procedure(s): CYSTOSCOPY WITH RETROGRADE PYELOGRAM, URETEROSCOPY ,POSSIBLE URETERAL BIOPSY AND STENT PLACEMENT (Right) as a surgical intervention .  The patient's history has been reviewed, patient examined, no change in status, stable for surgery.  I have reviewed the patient's chart and labs.  Questions were answered to the patient's satisfaction.     Leonore Frankson,LES

## 2014-02-11 NOTE — Anesthesia Postprocedure Evaluation (Signed)
  Anesthesia Post-op Note  Patient: Blake Carter  Procedure(s) Performed: Procedure(s) (LRB): CYSTOSCOPY WITH RETROGRADE PYELOGRAM, URETEROSCOPY ,URETERAL BIOPSY AND STENT PLACEMENT (Right)  Patient Location: PACU  Anesthesia Type: General  Level of Consciousness: awake and alert   Airway and Oxygen Therapy: Patient Spontanous Breathing  Post-op Pain: mild  Post-op Assessment: Post-op Vital signs reviewed, Patient's Cardiovascular Status Stable, Respiratory Function Stable, Patent Airway and No signs of Nausea or vomiting  Last Vitals:  Filed Vitals:   02/11/14 1751  BP: 151/93  Pulse: 63  Temp: 36.4 C  Resp: 18    Post-op Vital Signs: stable   Complications: No apparent anesthesia complications

## 2014-02-11 NOTE — Transfer of Care (Signed)
Immediate Anesthesia Transfer of Care Note  Patient: Blake Carter  Procedure(s) Performed: Procedure(s): CYSTOSCOPY WITH RETROGRADE PYELOGRAM, URETEROSCOPY ,URETERAL BIOPSY AND STENT PLACEMENT (Right)  Patient Location: PACU  Anesthesia Type:General  Level of Consciousness: awake, alert , oriented and patient cooperative  Airway & Oxygen Therapy: Patient Spontanous Breathing and Patient connected to face mask oxygen  Post-op Assessment: Report given to PACU RN, Post -op Vital signs reviewed and stable and Patient moving all extremities  Post vital signs: Reviewed and stable  Complications: No apparent anesthesia complications

## 2014-02-12 ENCOUNTER — Encounter (HOSPITAL_COMMUNITY): Payer: Self-pay | Admitting: Urology

## 2014-03-06 ENCOUNTER — Other Ambulatory Visit: Payer: Self-pay | Admitting: Endocrinology

## 2014-03-15 ENCOUNTER — Ambulatory Visit: Payer: BC Managed Care – PPO | Admitting: Endocrinology

## 2014-03-18 ENCOUNTER — Encounter: Payer: Self-pay | Admitting: Cardiology

## 2014-03-27 ENCOUNTER — Encounter: Payer: Self-pay | Admitting: Endocrinology

## 2014-03-27 ENCOUNTER — Telehealth: Payer: Self-pay

## 2014-03-27 ENCOUNTER — Telehealth: Payer: Self-pay | Admitting: Endocrinology

## 2014-03-27 ENCOUNTER — Ambulatory Visit (INDEPENDENT_AMBULATORY_CARE_PROVIDER_SITE_OTHER): Payer: BC Managed Care – PPO | Admitting: Endocrinology

## 2014-03-27 VITALS — BP 87/50 | HR 62 | Temp 98.1°F | Ht 71.0 in | Wt 267.0 lb

## 2014-03-27 DIAGNOSIS — E890 Postprocedural hypothyroidism: Secondary | ICD-10-CM

## 2014-03-27 MED ORDER — LEVOTHYROXINE SODIUM 150 MCG PO TABS: 150.0000 ug | ORAL_TABLET | Freq: Every day | ORAL | Status: DC

## 2014-03-27 NOTE — Telephone Encounter (Signed)
No need, thanks

## 2014-03-27 NOTE — Progress Notes (Signed)
Subjective:    Patient ID: Blake Carter, male    DOB: 1950-07-31, 63 y.o.   MRN: [id number]  HPI Pt returns for f/u of pos-I-131 hypothyroidism (he had XRT for enlarged adenoids as a child; he had resection of his thyroid isthmus in 1976, for a "cold" nodule (benign); in July of 2014, pt had i-131 rx for hyperthyroidism due to grave's dz; in September of 2014, he was noted to have elevated TSH, so synthroid was rx'ed).  He takes synthroid as rx'ed.  He takes it in am, with water only.  He has cold intolerance.  Synthroid dosage has been as high as 200 mcg/day.  Denies dizziness.    Past Medical History  Diagnosis Date  . Hypertension   . Hyperlipidemia     under control  . Environmental allergies     dry cough  . OCD (obsessive compulsive disorder)   . SUI (stress urinary incontinence), male     S/P ROBOTIC PROSTATECTOMY AND RADIATION TX  . Thyroid disease   . Depression   . Panic disorder   . GERD (gastroesophageal reflux disease)   . Allergic rhinitis   . ED (erectile dysfunction)   . Cancer     prostate cancer- robotic prostatectomy - followed by radiation because of positive lymph node--and pt on lupron injections  . PONV (postoperative nausea and vomiting)     after thyroid surgery no problems in 10 years   . Bradycardia     usually in 50's    Past Surgical History  Procedure Laterality Date  . Robotic prostatectomy  jan 2012  . Thyroidectomy, partial  june 1976  . Cystoscopy  11/30/2011    Procedure: CYSTOSCOPY;  Surgeon: Reece Packer, MD;  Location: [facility];  Service: Urology;  Laterality: N/A;  Inplantation of Artificial Sphincter and Cystoscopy  . Urinary sphincter implant  11/30/2011    Procedure: ARTIFICIAL URINARY SPHINCTER;  Surgeon: Reece Packer, MD;  Location: [facility];  Service: Urology;  Laterality: N/A;  . Thyroidectomy  2014    "nuked it"  . Upper gi endoscopy      food impaction 2009 done by Dr Oletta Lamas  . Eye surgery  2004    BILATERAL LASIK  EYE SURGERY  . Tonsillectomy  1956   . Colonoscopy  07/2013    Dr. Oletta Lamas  . Cystoscopy with retrograde pyelogram, ureteroscopy and stent placement Right 02/11/2014    Procedure: CYSTOSCOPY WITH RETROGRADE PYELOGRAM, URETEROSCOPY ,URETERAL BIOPSY AND STENT PLACEMENT;  Surgeon: Raynelle Bring, MD;  Location: [facility];  Service: Urology;  Laterality: Right;    History   Social History  . Marital Status: Married    Spouse Name: N/A    Number of Children: N/A  . Years of Education: N/A   Occupational History  . Not on file.   Social History Main Topics  . Smoking status: Never Smoker   . Smokeless tobacco: Never Used  . Alcohol Use: Yes     Comment: occas  . Drug Use: No  . Sexual Activity: Not on file   Other Topics Concern  . Not on file   Social History Narrative  . No narrative on file    Current Outpatient Prescriptions on File Prior to Visit  Medication Sig Dispense Refill  . amLODipine (NORVASC) 5 MG tablet Take 5 mg by mouth every morning.      Marland Kitchen atorvastatin (LIPITOR) 40 MG tablet Take 1 tablet (40 mg total) by mouth every morning.  90 tablet  3  . diazepam (VALIUM) 5 MG tablet Take 5 mg by mouth every 6 (six) hours as needed for anxiety.      Marland Kitchen esomeprazole (NEXIUM) 40 MG capsule Take 40 mg by mouth daily before breakfast.      . fluvoxaMINE (LUVOX) 50 MG tablet Take 150 mg by mouth every morning.       Marland Kitchen gemfibrozil (LOPID) 600 MG tablet Take 600 mg by mouth every morning.       Marland Kitchen lisinopril (PRINIVIL,ZESTRIL) 20 MG tablet Take 10 mg by mouth every morning.       . zolpidem (AMBIEN) 10 MG tablet Take 10 mg by mouth at bedtime as needed for sleep.        No current facility-administered medications on file prior to visit.    Allergies  Allergen Reactions  . Niacin And Related Other (See Comments)    unknown  . Tagamet [Cimetidine] Other (See Comments)    unknown    Family History  Problem Relation Age of Onset  . Cancer Father   . Hyperlipidemia Father     . Heart disease Father     BP 87/50  Pulse 62  Temp(Src) 98.1 F (36.7 C) (Oral)  Ht 5\' 11"  (1.803 m)  Wt 267 lb (121.11 kg)  BMI 37.26 kg/m2  SpO2 97%  Review of Systems He has fatigue and weight gain.      Objective:   Physical Exam VITAL SIGNS:  See vs page GENERAL: no distress NECK: There is no palpable thyroid enlargement.  No thyroid nodule is palpable.  No palpable lymphadenopathy at the anterior neck.     outside test results are reviewed: TSH=15    Assessment & Plan:  Hypothyroidism: he needs increased rx   Patient is advised the following: Patient Instructions  i have sent a prescription to your pharmacy, to increase the levothyroxine. Please repeat the blood test in 1 month.   Please come back for a follow-up appointment in 3 months.

## 2014-03-27 NOTE — Patient Instructions (Addendum)
i have sent a prescription to your pharmacy, to increase the levothyroxine. Please repeat the blood test in 1 month.   Please come back for a follow-up appointment in 3 months.

## 2014-03-27 NOTE — Telephone Encounter (Signed)
Contacted pt about coming in for a recheck on his blood pressure. Pt states that he did not want to come in for a recheck on his bp today. He stated he was feeling much better and would recheck and home and contact us with the result.

## 2014-03-27 NOTE — Telephone Encounter (Signed)
Pt called back and stated that his blood pressure 136 84. Should the pt still go to his PCP to have his blood pressure checked tomorrow? Thanks!

## 2014-03-27 NOTE — Telephone Encounter (Signed)
please call patient: Please have the blood pressure rechecked tomorrow, in your regular doctor's office.

## 2014-03-28 NOTE — Telephone Encounter (Signed)
Pt called yesterday that BP was 136 84. Per MD's ok in a sperate phone call Pt does not need to have BP checked at his PCP.

## 2014-04-03 ENCOUNTER — Telehealth: Payer: Self-pay

## 2014-04-03 ENCOUNTER — Other Ambulatory Visit: Payer: BC Managed Care – PPO

## 2014-04-03 DIAGNOSIS — Z79899 Other long term (current) drug therapy: Secondary | ICD-10-CM

## 2014-04-03 DIAGNOSIS — E781 Pure hyperglyceridemia: Secondary | ICD-10-CM

## 2014-04-03 NOTE — Telephone Encounter (Signed)
Patient advised of labs, specifically lipid profile and triglycerides. He states he has a h/o prostate cancer, and his urologist told him not to take Fish Oil of any kind. He will increase his Lopid to bid, not forgetting the evening dose. Will discuss this with Dr. Radford Pax at [facility] next week. Lab order placed.

## 2014-04-09 ENCOUNTER — Other Ambulatory Visit: Payer: Self-pay | Admitting: Urology

## 2014-04-10 ENCOUNTER — Ambulatory Visit (INDEPENDENT_AMBULATORY_CARE_PROVIDER_SITE_OTHER): Payer: BC Managed Care – PPO | Admitting: Cardiology

## 2014-04-10 VITALS — BP 138/92 | HR 60 | Ht 71.0 in | Wt 266.8 lb

## 2014-04-10 DIAGNOSIS — I2510 Atherosclerotic heart disease of native coronary artery without angina pectoris: Secondary | ICD-10-CM

## 2014-04-10 DIAGNOSIS — E785 Hyperlipidemia, unspecified: Secondary | ICD-10-CM

## 2014-04-10 DIAGNOSIS — I10 Essential (primary) hypertension: Secondary | ICD-10-CM

## 2014-04-10 DIAGNOSIS — I77810 Thoracic aortic ectasia: Secondary | ICD-10-CM

## 2014-04-10 MED ORDER — AMLODIPINE BESYLATE 5 MG PO TABS: 5.0000 mg | ORAL_TABLET | Freq: Every morning | ORAL | Status: DC

## 2014-04-10 MED ORDER — AMLODIPINE BESYLATE 2.5 MG PO TABS: 2.5000 mg | ORAL_TABLET | Freq: Every day | ORAL | Status: DC

## 2014-04-10 MED ORDER — AMLODIPINE BESYLATE 5 MG PO TABS: 7.5000 mg | ORAL_TABLET | Freq: Every morning | ORAL | Status: DC

## 2014-04-10 NOTE — Addendum Note (Signed)
Addended by: Harland German A on: 04/10/2014 09:16 AM   Modules accepted: Orders

## 2014-04-10 NOTE — Patient Instructions (Signed)
Your physician wants you to follow-up in: 6 months with Dr. Radford Pax. You will receive a reminder letter in the mail two months in advance. If you don't receive a letter, please call our office to schedule the follow-up appointment.  Your physician recommends that you return for lab work on Friday, November 20. (FASTING labs)  Your physician has recommended you make the following change in your medication:  1) INCREASE your amlodipine to 7.5 mg daily.

## 2014-04-10 NOTE — Progress Notes (Signed)
[place], [address] [place], [place]  [address] Phone: [phone] Fax:  [phone]  Date:  04/10/2014   ID:  Blake Carter, DOB [birth year], MRN [id number]  PCP:  Gennette Pac, MD  Cardiologist:  Fransico Him, MD    History of Present Illness: This is a 63yo male with a history of HTN, dyslipidemia and dilated aortic root who recently underwent chest CT angio for additional measurement of dilated aortic root that had recently increased in size from last assessment of 3.6cm to 4.1cm. Chest CT also showed coronary artery calcifications so a nuclear stress test was done which showed a high risk stress nuclear study with two reversible perfusion defects: a medium size moderate severity defect in the LAD territory and a small size moderate severity defect in the RCA territory. The inferior defect was felt to be a result of significant extracardiac activity. LV Ejection Fraction: 55%. NL LV Function; NL Wall Motion. He was completely asymptomatic and therefore currently is being medically managed.   He denies any chest pain, SOB, DOE, LE edema, dizziness, palpitations or syncope. He has not been exercising recently.   Wt Readings from Last 3 Encounters:  04/10/14 266 lb 12.8 oz (121.02 kg)  03/27/14 267 lb (121.11 kg)  02/11/14 260 lb (117.935 kg)     Past Medical History  Diagnosis Date  . Hypertension   . Hyperlipidemia     under control  . Environmental allergies     dry cough  . OCD (obsessive compulsive disorder)   . SUI (stress urinary incontinence), male     S/P ROBOTIC PROSTATECTOMY AND RADIATION TX  . Thyroid disease   . Depression   . Panic disorder   . GERD (gastroesophageal reflux disease)   . Allergic rhinitis   . ED (erectile dysfunction)   . Cancer     prostate cancer- robotic prostatectomy - followed by radiation because of positive lymph node--and pt on lupron injections  . PONV (postoperative nausea and vomiting)     after thyroid surgery no  problems in 10 years   . Bradycardia     usually in 50's    Current Outpatient Prescriptions  Medication Sig Dispense Refill  . amLODipine (NORVASC) 5 MG tablet Take 5 mg by mouth every morning.    Marland Kitchen aspirin 81 MG tablet Take 81 mg by mouth daily.    Marland Kitchen atorvastatin (LIPITOR) 40 MG tablet Take 1 tablet (40 mg total) by mouth every morning. 90 tablet 3  . diazepam (VALIUM) 5 MG tablet Take 5 mg by mouth every 6 (six) hours as needed for anxiety.    Marland Kitchen esomeprazole (NEXIUM) 40 MG capsule Take 40 mg by mouth daily before breakfast.    . fluvoxaMINE (LUVOX) 50 MG tablet Take 150 mg by mouth every morning.     Marland Kitchen gemfibrozil (LOPID) 600 MG tablet Take 600 mg by mouth every morning.     Marland Kitchen levothyroxine (SYNTHROID, LEVOTHROID) 150 MCG tablet Take 1 tablet (150 mcg total) by mouth daily before breakfast. 30 tablet 3  . lisinopril (PRINIVIL,ZESTRIL) 20 MG tablet Take 10 mg by mouth every morning.     . zolpidem (AMBIEN) 10 MG tablet Take 10 mg by mouth at bedtime as needed for sleep.      No current facility-administered medications for this visit.    Allergies:    Allergies  Allergen Reactions  . Niacin And Related Other (See Comments)    unknown  . Tagamet [Cimetidine] Other (See Comments)  unknown    Social History:  The patient  reports that he has never smoked. He has never used smokeless tobacco. He reports that he drinks alcohol. He reports that he does not use illicit drugs.   Family History:  The patient's family history includes Cancer in his father; Heart disease in his father; Hyperlipidemia in his father.   ROS:  Please see the history of present illness.      All other systems reviewed and negative.   PHYSICAL EXAM: VS:  BP 138/92 mmHg  Pulse 60  Ht 5\' 11"  (1.803 m)  Wt 266 lb 12.8 oz (121.02 kg)  BMI 37.23 kg/m2  SpO2 98% Well nourished, well developed, in no acute distress HEENT: normal Neck: no JVD Cardiac:  normal S1, S2; RRR; no murmur Lungs:  clear to  auscultation bilaterally, no wheezing, rhonchi or rales Abd: soft, nontender, no hepatomegaly Ext: no edema Skin: warm and dry Neuro:  CNs 2-12 intact, no focal abnormalities noted  ASSESSMENT AND PLAN:  1. Coronary artery calcifications on chest CT angio with a fixed inferior defect and a very small defect in the anterior wall - this was a low risk scan and he has been asymptomatic so he is being medically managed. - continue ASA - I encouraged him to get back into an exercise regimen 2. HTN - borderline controlled - continue amlodipine/lisinopril - increase amlodipine to 7.5mg  daily - check BMET 3.  Dyslipidemia - last LDL was 77 on 01/14/2014 - continue atorvastatin/lopid - recheck FLP and ALT   4.  Mildly dilated aortic root - repeat echo 10/2014  Followup with me in 6 month     Signed, Fransico Him, MD [facility] 04/10/2014 8:48 AM

## 2014-04-11 ENCOUNTER — Other Ambulatory Visit (HOSPITAL_COMMUNITY): Payer: Self-pay | Admitting: *Deleted

## 2014-04-11 NOTE — Patient Instructions (Addendum)
Blake Carter  04/11/2014                           YOUR PROCEDURE IS SCHEDULED ON:  04/15/14                ENTER FROM [address] - GO TO PARKING DECK               LOOK FOR [facility]  / [employer]                              FOLLOW  SIGNS TO SHORT STAY CENTER                 ARRIVE AT SHORT STAY AT:  7:30 AM               CALL THIS NUMBER IF ANY PROBLEMS THE DAY OF SURGERY :               [phone]                                REMEMBER:   Do not eat food or drink liquids AFTER MIDNIGHT                  Take these medicines the morning of surgery with               A SIPS OF WATER :   AMLODIPINE / LIPITOR / NEXIUM / GEMFIBROZIL / LUVOX / SYNTHROID      Do not wear jewelry, make-up   Do not wear lotions, powders, or perfumes.   Do not shave legs or underarms 12 hrs. before surgery (men may shave face)  Do not bring valuables to the hospital.  Contacts, dentures or bridgework may not be worn into surgery.  Leave suitcase in the car. After surgery it may be brought to your room.  For patients admitted to the hospital more than one night, checkout time is            11:00 AM                                                       The day of discharge.   Patients discharged the day of surgery will not be allowed to drive home.            If going home same day of surgery, must have someone stay with you              FIRST 24 hrs at home and arrange for some one to drive you              home from hospital.   ________________________________________________________________________  Blake Carter  Before surgery, you can play an important role.  Because skin is not sterile, your skin needs to be as free of germs as possible.  You can reduce the number of germs on your skin by washing with CHG (chlorahexidine gluconate) soap before  surgery.  CHG is an antiseptic cleaner which kills germs and bonds with the skin to continue killing germs even after washing. Please DO NOT use if you have an allergy to CHG or antibacterial soaps.  If your skin becomes reddened/irritated stop using the CHG and inform your nurse when you arrive at Short Stay. Do not shave (including legs and underarms) for at least 48 hours prior to the first CHG shower.  You may shave your face. Please follow these instructions carefully:   1.  Shower with CHG Soap the night before surgery and the  morning of Surgery.   2.  If you choose to wash your hair, wash your hair first as usual with your  normal  Shampoo.   3.  After you shampoo, rinse your hair and body thoroughly to remove the  shampoo.                                         4.  Use CHG as you would any other liquid soap.  You can apply chg directly  to the skin and wash . Gently wash with scrungie or clean wascloth    5.  Apply the CHG Soap to your body ONLY FROM THE NECK DOWN.   Do not use on open                           Wound or open sores. Avoid contact with eyes, ears mouth and genitals (private parts).                        Genitals (private parts) with your normal soap.              6.  Wash thoroughly, paying special attention to the area where your surgery  will be performed.   7.  Thoroughly rinse your body with warm water from the neck down.   8.  DO NOT shower/wash with your normal soap after using and rinsing off  the CHG Soap .                9.  Pat yourself dry with a clean towel.             10.  Wear clean pajamas.             11.  Place clean sheets on your bed the night of your first shower and do not  sleep with pets.  Day of Surgery : Do not apply any lotions/deodorants the morning of surgery.  Please wear clean clothes to the hospital/surgery center.  FAILURE TO FOLLOW THESE INSTRUCTIONS MAY RESULT IN THE CANCELLATION OF YOUR SURGERY    PATIENT  SIGNATURE_________________________________  ______________________________________________________________________

## 2014-04-12 ENCOUNTER — Encounter (HOSPITAL_COMMUNITY)
Admission: RE | Admit: 2014-04-12 | Discharge: 2014-04-12 | Disposition: A | Discharge status: Home / Self Care | Payer: BC Managed Care – PPO | Source: Ambulatory Visit | Attending: Urology | Admitting: Urology

## 2014-04-12 ENCOUNTER — Encounter (HOSPITAL_COMMUNITY): Payer: Self-pay

## 2014-04-12 DIAGNOSIS — K219 Gastro-esophageal reflux disease without esophagitis: Secondary | ICD-10-CM | POA: Diagnosis not present

## 2014-04-12 DIAGNOSIS — N131 Hydronephrosis with ureteral stricture, not elsewhere classified: Secondary | ICD-10-CM | POA: Diagnosis not present

## 2014-04-12 DIAGNOSIS — R001 Bradycardia, unspecified: Secondary | ICD-10-CM | POA: Diagnosis not present

## 2014-04-12 DIAGNOSIS — I10 Essential (primary) hypertension: Secondary | ICD-10-CM | POA: Diagnosis not present

## 2014-04-12 DIAGNOSIS — F42 Obsessive-compulsive disorder: Secondary | ICD-10-CM | POA: Diagnosis not present

## 2014-04-12 DIAGNOSIS — Z8546 Personal history of malignant neoplasm of prostate: Secondary | ICD-10-CM | POA: Diagnosis not present

## 2014-04-12 DIAGNOSIS — N529 Male erectile dysfunction, unspecified: Secondary | ICD-10-CM | POA: Diagnosis not present

## 2014-04-12 DIAGNOSIS — F419 Anxiety disorder, unspecified: Secondary | ICD-10-CM | POA: Diagnosis not present

## 2014-04-12 DIAGNOSIS — N393 Stress incontinence (female) (male): Secondary | ICD-10-CM | POA: Diagnosis not present

## 2014-04-12 DIAGNOSIS — E785 Hyperlipidemia, unspecified: Secondary | ICD-10-CM | POA: Diagnosis not present

## 2014-04-12 DIAGNOSIS — Z7982 Long term (current) use of aspirin: Secondary | ICD-10-CM | POA: Diagnosis not present

## 2014-04-12 DIAGNOSIS — F605 Obsessive-compulsive personality disorder: Secondary | ICD-10-CM | POA: Diagnosis not present

## 2014-04-12 HISTORY — DX: Personal history of other diseases of the digestive system: Z87.19

## 2014-04-12 HISTORY — DX: Unspecified hydronephrosis: N13.30

## 2014-04-12 LAB — BASIC METABOLIC PANEL
Anion gap: 13 (ref 5–15)
BUN: 17 mg/dL (ref 6–23)
CO2: 27 mEq/L (ref 19–32)
Calcium: 9.5 mg/dL (ref 8.4–10.5)
Chloride: 105 mEq/L (ref 96–112)
Creatinine, Ser: 1.15 mg/dL (ref 0.50–1.35)
GFR calc Af Amer: 77 mL/min — ABNORMAL LOW (ref >90)
GFR calc non Af Amer: 66 mL/min — ABNORMAL LOW (ref >90)
Glucose, Bld: 94 mg/dL (ref 70–99)
Potassium: 5 mEq/L (ref 3.7–5.3)
Sodium: 145 mEq/L (ref 137–147)

## 2014-04-12 LAB — CBC
HCT: 40.2 % (ref 39.0–52.0)
Hemoglobin: 12.9 g/dL — ABNORMAL LOW (ref 13.0–17.0)
MCH: 28.6 pg (ref 26.0–34.0)
MCHC: 32.1 g/dL (ref 30.0–36.0)
MCV: 89.1 fL (ref 78.0–100.0)
Platelets: 217 10*3/uL (ref 150–400)
RBC: 4.51 MIL/uL (ref 4.22–5.81)
RDW: 14.1 % (ref 11.5–15.5)
WBC: 4.9 10*3/uL (ref 4.0–10.5)

## 2014-04-14 NOTE — H&P (Signed)
History of Present Illness Blake Carter is a 43 year with the following urologic history:    1) Lymph node positive prostate cancer: He is s/p initial treatment with a BNS RAL radical prostatectomy on 06/08/10. His PSA was detectable postoperatively and he elected to proceed with adjuvant radiation (May-June 2012) under the care of Dr. Valere Dross and androgen deprivation therapy (for a minimum of 2 years). His PSA became undetectable after starting ADT. He completed 2 years of ADT.    Diagnosis: pT3a N1 Mx, Gleason 4+3=7 (tertiary pattern 5) with multiple positive margins. 1/6 lymph nodes positive.   Pretreatment PSA: 3.95  Pretreatment SHIM: 20 (occasionally took Viagra pre-op)    2) Incontinence: He had almost regained full continence after his radical prostatectomy but developed progressive incontinence after his radiation therapy. He ultimately underwent artificial urinary sphincter placement by Dr. Matilde Sprang on 11/30/11.    3) Hematuria/right hydronephrosis/right ureteral mass: He developed gross hematuria in April 2014. He underwent cystoscopy at that time with findings that were indeterminate but most consistent with radiation changes and inflammation. Follow up cystoscopy in August 2014 demonstrated resolution of the concerning cystoscopic lesion indicating it was likely related to his radiation treatment but not a malignant change. He again developed gross hematuria in July 2015. Cystoscopic evaluation was consistent with radiation changes and cytology was negative. He then developed right flank pain and CT imaging revealed right hydroureteronephrosis without a clear etiology. Ureteroscopy in September 2015 revealed a polypoid ureteral mass and this was biopsied/removed with pathology indicating a this mass appeared to be a formed blood clot without concern for malignancy.    Interval history:    He follows up today after developing the recurrence of right-sided flank pain about 3  days ago. This has not been as severe as he had noted previously but definitely noticeable. He has taken Tylenol with relief. He has denied any fever but has noted some intermittent gross hematuria over the past week again. He denies any other associated symptoms. He does feel that he is emptying his bladder adequately.   Past Medical History Problems  1. History of Anxiety (F41.9) 2. History of esophageal reflux (Z87.19) 3. History of hyperlipidemia (Z86.39) 4. History of hypertension (Z86.79) 5. 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 Prostate cancer (C61)  Surgical History Problems  1. History of Correct Urin Incont: Placement Inflat Bladder Neck Sphincter 2. History of Cystoscopy With Insertion Of Ureteral Stent Right 3. History of Cystoscopy With Ureteroscopy For Biopsy Right 4. History of Prostatectomy Robotic-Assisted 5. History of Thyroid Surgery 6. History of Tonsillectomy  Current Meds 1. AmLODIPine Besylate 5 MG Oral Tablet;  Therapy: (Recorded:18Oct2011) to Recorded 2. Anusol-HC 2.5 % CREA (Hydrocortisone);  Therapy: (Recorded:23Apr2014) to Recorded 3. Aspirin 81 MG Oral Tablet;  Therapy: (Recorded:18Oct2011) to Recorded 4. Diazepam 5 MG Oral Tablet;  Therapy: 21Feb2014 to Recorded 5. FluvoxaMINE Maleate 50 MG Oral Tablet;  Therapy: (Recorded:18Oct2011) to Recorded 6. Gemfibrozil 600 MG Oral Tablet;  Therapy: (Recorded:18Oct2011) to Recorded 7. Ibuprofen 800 MG Oral Tablet;  Therapy: [id number] to Recorded 8. Levothyroxine Sodium 125 MCG Oral Tablet;  Therapy: 14Apr2015 to Recorded 9. Lipitor 20 MG Oral Tablet (Atorvastatin Calcium);  Therapy: (Recorded:18Oct2011) to Recorded 10. Lisinopril 20 MG Oral Tablet;   Therapy: (Recorded:18Oct2011) to Recorded 11. Multi-Vitamin TABS;   Therapy: (Recorded:18Oct2011) to Recorded 12. NexIUM 40 MG Oral Capsule Delayed Release (Esomeprazole Magnesium);   Therapy: (Recorded:18Oct2011) to Recorded 13.  Toprol XL 25 MG Oral Tablet Extended Release 24 Hour (Metoprolol Succinate  ER);   Therapy: [id number] to Recorded 14. Trimix (PGE 20, PAP 30, PHE 0.5); Please give patient a 76mL test dose of Trimix (PGE   61mcg, PAP 30 mg, Phentolamine 0.5mg );   Therapy: [id number] to (Last Rx:15Aug2014)  Requested for: 15Aug2014 Ordered 15. Trimix (PGE 20, PAP 30, PHE 0.5); Trimix (PGE 20 mcg, Papaverine 30 mg,   Phentolamine 0.5 mg).  Please give patient 5 mL vial.  Please provide syringes and   alcohol swabs;   Therapy: [phone] to (Last Rx:16Oct2014)  Requested for: 16Oct2014 Ordered 16. Zolpidem Tartrate 10 MG Oral Tablet;   Therapy: [id number] to Recorded  Allergies Medication  1. Niacin TABS 2. Tagamet TABS  Family History Problems  1. Family history of Acute Myocardial Infarction : Father 2. Family history of Brain Cancer : Mother 3. Family history of Lung Cancer : Father 4. Denied: Family history of Prostate Cancer  Social History Problems  1. Alcohol Use   occ social use avg once a mth 2. Marital History - Currently Married 3. Never A Smoker 4. Occupation:   professor 5. Denied: History of Tobacco Use  Vitals Vital Signs [Data Includes: Last 1 Day]  Recorded: [id number] 11:55AM  Blood Pressure: 133 / 86 Temperature: 97.8 F Heart Rate: 64  Physical Exam Constitutional: Well nourished and well developed . No acute distress.  ENT:. The ears and nose are normal in appearance.  Neck: The appearance of the neck is normal and no neck mass is present.  Pulmonary: No respiratory distress and normal respiratory rhythm and effort.  Cardiovascular: Heart rate and rhythm are normal . No peripheral edema.  Abdomen: The abdomen is soft and nontender. No masses are palpated. No CVA tenderness.  Neuro/Psych:. Mood and affect are appropriate.    Results/Data Urine [Data Includes: Last 1 Day]   [id number]  COLOR YELLOW   APPEARANCE CLEAR   SPECIFIC GRAVITY <1.005   pH 6.5   GLUCOSE NEG  mg/dL  BILIRUBIN NEG   KETONE NEG mg/dL  BLOOD TRACE   PROTEIN NEG mg/dL  UROBILINOGEN 0.2 mg/dL  NITRITE NEG   LEUKOCYTE ESTERASE NEG   SQUAMOUS EPITHELIAL/HPF NONE SEEN   WBC NONE SEEN WBC/hpf  RBC 0-2 RBC/hpf  BACTERIA NONE SEEN   CRYSTALS NONE SEEN   CASTS NONE SEEN    A renal ultrasound was performed due to his recurrent right flank pain and hematuria. I independently reviewed this ultrasound today. The right kidney measures 13.9 x 7.1 x 6.8 cm. There is moderate hydronephrosis with dilation of the proximal ureter to over 1 cm. No renal masses or obvious renal pelvic masses are noted. No renal calculi. The left kidney measures 13.7 x 6.6 x 6.2 cm. There is a simple appearing cyst measuring 1.3 x 1.0 cm in the upper pole left kidney. No hydronephrosis. Bladder PVR is 34 cc   Assessment Assessed  1. Hydronephrosis (N13.30)  Plan Health Maintenance  1. UA With REFLEX; [Do Not Release]; Status:Complete;   Done: [id number] 11:24AM Hydronephrosis  2. Follow-up Office  Follow-up - will call to schedule surgery  Status: Complete  Done:  [id number]  Discussion/Summary 1. Recurrent right hydronephrosis with gross hematuria: Considering that he is symptomatic, this is concerning for a recurrent process which is somewhat concerning and may not indicate a solely benign etiology for his findings. I have recommended more definitive evaluation at this time considering these findings including cystoscopy, right retrograde pyelography, and right ureteroscopy. We will plan to proceed with a full ureteroscopic evaluation at this  point even if his retrograde pyelogram is normal both to determine the etiology for his recurrent hematuria and considering the fact that he developed recurrent flank pain and hydronephrosis so quickly after his last procedure.    I have reviewed the potential risks, complications, and alternative treatment options as well as the expected recovery process. Informed consent  has been obtained.    2. Prostate cancer: He will keep his scheduled follow-up in March for ongoing surveillance.    3. Erectile dysfunction: We will consider the option of utilizing Trimix again if he wishes to make this more priority in the future.    Cc: Dr. Hulan Fess  Dr. Arloa Koh     Signatures Electronically signed by : Raynelle Bring, M.D.; Apr 09 2014  1:48PM EST

## 2014-04-15 ENCOUNTER — Ambulatory Visit (HOSPITAL_COMMUNITY): Payer: BC Managed Care – PPO | Admitting: *Deleted

## 2014-04-15 ENCOUNTER — Encounter (HOSPITAL_COMMUNITY): Payer: Self-pay | Admitting: *Deleted

## 2014-04-15 ENCOUNTER — Encounter (HOSPITAL_COMMUNITY)
Admission: RE | Disposition: A | Discharge status: Home / Self Care | Payer: Self-pay | Source: Ambulatory Visit | Attending: Urology

## 2014-04-15 ENCOUNTER — Ambulatory Visit (HOSPITAL_COMMUNITY)
Admission: RE | Admit: 2014-04-15 | Discharge: 2014-04-15 | Disposition: A | Discharge status: Home / Self Care | Payer: BC Managed Care – PPO | Source: Ambulatory Visit | Attending: Urology | Admitting: Urology

## 2014-04-15 DIAGNOSIS — R001 Bradycardia, unspecified: Secondary | ICD-10-CM | POA: Insufficient documentation

## 2014-04-15 DIAGNOSIS — F42 Obsessive-compulsive disorder: Secondary | ICD-10-CM | POA: Insufficient documentation

## 2014-04-15 DIAGNOSIS — N131 Hydronephrosis with ureteral stricture, not elsewhere classified: Secondary | ICD-10-CM | POA: Insufficient documentation

## 2014-04-15 DIAGNOSIS — Z7982 Long term (current) use of aspirin: Secondary | ICD-10-CM | POA: Insufficient documentation

## 2014-04-15 DIAGNOSIS — N393 Stress incontinence (female) (male): Secondary | ICD-10-CM | POA: Insufficient documentation

## 2014-04-15 DIAGNOSIS — E785 Hyperlipidemia, unspecified: Secondary | ICD-10-CM | POA: Insufficient documentation

## 2014-04-15 DIAGNOSIS — K219 Gastro-esophageal reflux disease without esophagitis: Secondary | ICD-10-CM | POA: Insufficient documentation

## 2014-04-15 DIAGNOSIS — F605 Obsessive-compulsive personality disorder: Secondary | ICD-10-CM | POA: Insufficient documentation

## 2014-04-15 DIAGNOSIS — F419 Anxiety disorder, unspecified: Secondary | ICD-10-CM | POA: Insufficient documentation

## 2014-04-15 DIAGNOSIS — I10 Essential (primary) hypertension: Secondary | ICD-10-CM | POA: Insufficient documentation

## 2014-04-15 DIAGNOSIS — N529 Male erectile dysfunction, unspecified: Secondary | ICD-10-CM | POA: Insufficient documentation

## 2014-04-15 DIAGNOSIS — Z8546 Personal history of malignant neoplasm of prostate: Secondary | ICD-10-CM | POA: Insufficient documentation

## 2014-04-15 HISTORY — PX: CYSTOSCOPY WITH RETROGRADE PYELOGRAM, URETEROSCOPY AND STENT PLACEMENT: SHX5789

## 2014-04-15 SURGERY — CYSTOURETEROSCOPY, WITH RETROGRADE PYELOGRAM AND STENT INSERTION
Anesthesia: General | Laterality: Right

## 2014-04-15 MED ORDER — SODIUM CHLORIDE 0.9 % IR SOLN
Status: DC | PRN
  Administered 2014-04-15: 4000 mL

## 2014-04-15 MED ORDER — LACTATED RINGERS IV SOLN
INTRAVENOUS | Status: DC
  Administered 2014-04-15: 1000 mL via INTRAVENOUS

## 2014-04-15 MED ORDER — IOHEXOL 300 MG/ML  SOLN
INTRAMUSCULAR | Status: DC | PRN
  Administered 2014-04-15: 10 mL via URETHRAL

## 2014-04-15 MED ORDER — MIDAZOLAM HCL 5 MG/5ML IJ SOLN
INTRAMUSCULAR | Status: DC | PRN
  Administered 2014-04-15 (×2): 1 mg via INTRAVENOUS

## 2014-04-15 MED ORDER — MIDAZOLAM HCL 2 MG/2ML IJ SOLN
INTRAMUSCULAR | Status: AC
  Filled 2014-04-15: qty 2

## 2014-04-15 MED ORDER — LACTATED RINGERS IV SOLN: INTRAVENOUS | Status: DC

## 2014-04-15 MED ORDER — FENTANYL CITRATE 0.05 MG/ML IJ SOLN
INTRAMUSCULAR | Status: AC
  Filled 2014-04-15: qty 2

## 2014-04-15 MED ORDER — PROPOFOL 10 MG/ML IV BOLUS
INTRAVENOUS | Status: AC
  Filled 2014-04-15: qty 20

## 2014-04-15 MED ORDER — LIDOCAINE HCL (CARDIAC) 20 MG/ML IV SOLN
INTRAVENOUS | Status: AC
  Filled 2014-04-15: qty 5

## 2014-04-15 MED ORDER — LIDOCAINE HCL 1 % IJ SOLN
INTRAMUSCULAR | Status: DC | PRN
  Administered 2014-04-15: 50 mg via INTRADERMAL

## 2014-04-15 MED ORDER — FENTANYL CITRATE 0.05 MG/ML IJ SOLN
INTRAMUSCULAR | Status: DC | PRN
  Administered 2014-04-15: 100 ug via INTRAVENOUS

## 2014-04-15 MED ORDER — DEXAMETHASONE SODIUM PHOSPHATE 10 MG/ML IJ SOLN
INTRAMUSCULAR | Status: DC | PRN
  Administered 2014-04-15: 10 mg via INTRAVENOUS

## 2014-04-15 MED ORDER — HYDROCODONE-ACETAMINOPHEN 5-325 MG PO TABS: 1.0000 | ORAL_TABLET | Freq: Once | ORAL | Status: DC | PRN

## 2014-04-15 MED ORDER — ONDANSETRON HCL 4 MG/2ML IJ SOLN
INTRAMUSCULAR | Status: DC | PRN
  Administered 2014-04-15: 4 mg via INTRAVENOUS

## 2014-04-15 MED ORDER — CIPROFLOXACIN HCL 250 MG PO TABS: 250.0000 mg | ORAL_TABLET | Freq: Two times a day (BID) | ORAL | Status: DC

## 2014-04-15 MED ORDER — PROPOFOL 10 MG/ML IV BOLUS
INTRAVENOUS | Status: DC | PRN
  Administered 2014-04-15: 200 mg via INTRAVENOUS

## 2014-04-15 MED ORDER — CIPROFLOXACIN IN D5W 400 MG/200ML IV SOLN
INTRAVENOUS | Status: AC
  Filled 2014-04-15: qty 200

## 2014-04-15 MED ORDER — EPHEDRINE SULFATE 50 MG/ML IJ SOLN
INTRAMUSCULAR | Status: DC | PRN
  Administered 2014-04-15: 5 mg via INTRAVENOUS

## 2014-04-15 MED ORDER — HYDROCODONE-ACETAMINOPHEN 5-325 MG PO TABS: 1.0000 | ORAL_TABLET | Freq: Four times a day (QID) | ORAL | Status: DC | PRN

## 2014-04-15 MED ORDER — CIPROFLOXACIN IN D5W 400 MG/200ML IV SOLN
400.0000 mg | INTRAVENOUS | Status: AC
  Administered 2014-04-15: 400 mg via INTRAVENOUS

## 2014-04-15 MED ORDER — METOCLOPRAMIDE HCL 5 MG/ML IJ SOLN
INTRAMUSCULAR | Status: AC
  Filled 2014-04-15: qty 2

## 2014-04-15 MED ORDER — FENTANYL CITRATE 0.05 MG/ML IJ SOLN: 25.0000 ug | INTRAMUSCULAR | Status: DC | PRN

## 2014-04-15 MED ORDER — LACTATED RINGERS IV SOLN: INTRAVENOUS | Status: DC | PRN

## 2014-04-15 MED ORDER — DEXAMETHASONE SODIUM PHOSPHATE 10 MG/ML IJ SOLN
INTRAMUSCULAR | Status: AC
  Filled 2014-04-15: qty 1

## 2014-04-15 MED ORDER — METOCLOPRAMIDE HCL 5 MG/ML IJ SOLN
INTRAMUSCULAR | Status: DC | PRN
  Administered 2014-04-15: 10 mg via INTRAVENOUS

## 2014-04-15 SURGICAL SUPPLY — 15 items
BAG URO CATCHER STRL LF (DRAPE) ×2 IMPLANT
BASKET ZERO TIP NITINOL 2.4FR (BASKET) IMPLANT
BRUSH URET BIOPSY 3F (UROLOGICAL SUPPLIES) ×2 IMPLANT
CATH INTERMIT  6FR 70CM (CATHETERS) ×2 IMPLANT
CLOTH BEACON ORANGE TIMEOUT ST (SAFETY) ×2 IMPLANT
DRAPE CAMERA CLOSED 9X96 (DRAPES) ×2 IMPLANT
GLOVE BIOGEL M STRL SZ7.5 (GLOVE) ×10 IMPLANT
GOWN STRL REUS W/TWL LRG LVL3 (GOWN DISPOSABLE) ×6 IMPLANT
GUIDEWIRE ANG ZIPWIRE 038X150 (WIRE) IMPLANT
GUIDEWIRE STR DUAL SENSOR (WIRE) ×4 IMPLANT
MANIFOLD NEPTUNE II (INSTRUMENTS) ×2 IMPLANT
PACK CYSTO (CUSTOM PROCEDURE TRAY) ×2 IMPLANT
SHEATH ACCESS URETERAL 38CM (SHEATH) ×2 IMPLANT
STENT CONTOUR 6FRX24X.038 (STENTS) ×2 IMPLANT
TUBING CONNECTING 10 (TUBING) ×2 IMPLANT

## 2014-04-15 NOTE — Transfer of Care (Signed)
Immediate Anesthesia Transfer of Care Note  Patient: Blake Carter  Procedure(s) Performed: Procedure(s): CYSTOSCOPY WITH RETROGRADE PYELOGRAM, URETEROSCOPY, BIOPSY AND STENT PLACEMENT (Right)  Patient Location: PACU  Anesthesia Type:General  Level of Consciousness: awake, alert , oriented and patient cooperative  Airway & Oxygen Therapy: Patient Spontanous Breathing and Patient connected to face mask oxygen  Post-op Assessment: Report given to PACU RN, Post -op Vital signs reviewed and stable and Patient moving all extremities  Post vital signs: Reviewed and stable  Complications: No apparent anesthesia complications

## 2014-04-15 NOTE — Op Note (Signed)
Preoperative diagnosis:  1. Right hydronephrosis 2. Gross hematuria  Postoperative diagnosis: 1. Right hydronephrosis 2. Gross hematuria 3. Right ureteral stricture  Procedure(s): 1. Cystoscopy 2. Right retrograde pyelography with intepretation 3. Right ureteroscopy 4. Brush biopsy of right ureteral stricture 5. Cytology from washing of right renal pelvis 6. Right ureteral stent placement (6 x 24 - no string)  Surgeon: Dr. Roxy Horseman, Jr  Anesthesia: General  Complications: None  EBL: Minimal  Specimens: 1. Brush biopsy of right ureteral stricture 2. Cytology of right renal pelvis  Disposition of specimens: Pathology  Intraoperative findings: Right retrograde pyelography demonstrated a dilated right renal collecting system with dilated calyces consistent with moderate hydronephrosis but no filling defects.  The proximal and mid right ureter was dilated down to the distal ureter where a distinct transition point was noted without filling defects noted.  Indication: Blake Carter is a 63 year old gentleman with a history of prostate cancer status post a robotic-assisted laparoscopic radical prostatectomy and subsequent radiation therapy to the pelvis.  He also is status post artificial urinary sphincter placement for incontinence.  He presents today with complaints of recurrent right-sided hydronephrosis, right flank pain, and gross hematuria.  We have discussed the symptoms and I do recommend further endoscopic evaluation leading cystoscopy, right retrograde pyelography, right ureteroscopy, and possible biopsy/right ureteral stent placement.  The potential risks, complications, alternative options, and expected recovery process as been discussed in detail and informed consent.  Description of procedure:  The patient was taken to the operating room and a general anesthetic was administered. He was given preoperative antibiotics, placed in the dorsal lithotomy position, and  wrapped and draped in the usual sterile fashion.  A preoperative timeout was performed.  Considering his artificial urinary sphincter, I first deactivated the sphincter and performed flexible cystoscopy.  He did have multiple arias consistent with radiation change the likely were the cause of his hematuria.  No bladder tumors were identified.  I then turned my attention to the right ureteral orifice.  A 6 French ureteral catheter was advanced into the distal right ureter and Omnipaque contrast was injected.  Findings indicated a distinct transition point in the distal ureter consistent with obstruction.  No intraluminal filling defects were identified but a distinct transition point with proximal dilation of the ureter was noted.  There was also noted to be right hydronephrosis without filling defects in the renal collecting system.  A 0.38 sensor guidewire was advanced up the right ureter into the renal pelvis under fluoroscopic guidance without difficulty.  I then inserted the 6 French semirigid ureteroscope next to the wire and identified a ureteral stricture at the level of the transition point noted on retrograde pyelography.  I was able to open this area slightly with a second 0.38 sensor guidewire to expand the ureter.  There were no tumors or other abnormalities identified.  This was in the vicinity of his prior clot that had been noted and removed at his last procedure.  I then used a brush biopsy to take a biopsy of the ureteral stricture.  At this point the strictured area appeared open.  I used the inner sheath of a 12/14 ureteral access sheath to gently dilate this very with ease.  I then placed the inner and outer sheaths and passed them over wire up into the proximal ureter without difficulty or resistance met at the stricture site.  I then performed a digital flexible ureteroscopy and the entire renal collecting system was identified without any tumors or  mucosal abnormalities identified.  However,  there was noted to be some blood within the right renal collecting system with no obvious source for active bleeding noted.  I did perform a saline washing for cytology from the right renal pelvis and this was sent for cytologic analysis.  I then replaced the wire in the renal collecting system and removed the ureteral access sheath and the ureteroscope.  The guidewire was then backloaded on the cystoscope and a 6 x 24 double-J ureteral stent was advanced over the wire using Seldinger technique.  Proper positioning was confirmed under fluoroscopic and cystoscopic guidance.  The patient's bladder was then emptied with a 14 French catheter.  The patient's artificial urinary sphincter was reactivated.  He tolerated the procedure well and without complications.  He was able to be awakened and transferred to the recovery unit satisfactory condition.

## 2014-04-15 NOTE — Progress Notes (Signed)
Patient refused to wait because Dr Alinda Money would be more than 30 min. Call to Dr Alinda Money to inform.

## 2014-04-15 NOTE — Anesthesia Postprocedure Evaluation (Signed)
  Anesthesia Post-op Note  Patient: Blake Carter  Procedure(s) Performed: Procedure(s) (LRB): CYSTOSCOPY WITH RETROGRADE PYELOGRAM, URETEROSCOPY, BIOPSY AND STENT PLACEMENT (Right)  Patient Location: PACU  Anesthesia Type: General  Level of Consciousness: awake and alert   Airway and Oxygen Therapy: Patient Spontanous Breathing  Post-op Pain: mild  Post-op Assessment: Post-op Vital signs reviewed, Patient's Cardiovascular Status Stable, Respiratory Function Stable, Patent Airway and No signs of Nausea or vomiting  Last Vitals:  Filed Vitals:   04/15/14 1014  BP: 130/62  Pulse: 55  Temp: 36.3 C  Resp: 16    Post-op Vital Signs: stable   Complications: No apparent anesthesia complications

## 2014-04-15 NOTE — Interval H&P Note (Signed)
History and Physical Interval Note:  04/15/2014 8:13 AM  Blake Carter  has presented today for surgery, with the diagnosis of RIGHT HYDRONEPHROSIS, HEMATURIA  The various methods of treatment have been discussed with the patient and family. After consideration of risks, benefits and other options for treatment, the patient has consented to  Procedure(s): CYSTOSCOPY WITH RETROGRADE PYELOGRAM, URETEROSCOPY, POSSIBLE BIOPSY AND STENT PLACEMENT (Right) as a surgical intervention .  The patient's history has been reviewed, patient examined, no change in status, stable for surgery.  I have reviewed the patient's chart and labs.  Questions were answered to the patient's satisfaction.     [name],LES

## 2014-04-15 NOTE — Discharge Instructions (Signed)
1. You may see some blood in the urine and may have some burning with urination for 48-72 hours. You also may notice that you have to urinate more frequently or urgently after your procedure which is normal.  2. You should call should you develop an inability urinate, fever > 101, persistent nausea and vomiting that prevents you from eating or drinking to stay hydrated.  3. If you have a stent, you will likely urinate more frequently and urgently until the stent is removed and you may experience some discomfort/pain in the lower abdomen and flank especially when urinating. You may take pain medication prescribed to you if needed for pain. You may also intermittently have blood in the urine until the stent is removed. 4. You may resume aspirin 81 mg once bleeding has stopped.

## 2014-04-15 NOTE — Progress Notes (Signed)
Called to OR. Spoke Dr Alinda Money through [facility]. Patient feels like  Dr Alinda Money did not turn his sphincter on. Dr Alinda Money stated he did activate it. Patient could wait and he would check it out.

## 2014-04-15 NOTE — Anesthesia Preprocedure Evaluation (Addendum)
Anesthesia Evaluation  Patient identified by MRN, date of birth, ID band Patient awake    Reviewed: Allergy & Precautions, H&P , NPO status , Patient's Chart, lab work & pertinent test results  History of Anesthesia Complications (+) PONV and history of anesthetic complications  Airway Mallampati: II  TM Distance: >3 FB Neck ROM: Full    Dental no notable dental hx. (+) Teeth Intact, Dental Advisory Given   Pulmonary neg pulmonary ROS,  breath sounds clear to auscultation  Pulmonary exam normal       Cardiovascular hypertension, Pt. on medications + CAD and + Peripheral Vascular Disease negative cardio ROS  Rhythm:Regular Rate:Normal  Hx of abnormal nuclear stress test but reviewed by Dr. Radford Pax and no further action needed at this time, patient completely asymptomatic   Neuro/Psych Anxiety Depression OCDnegative neurological ROS  negative psych ROS   GI/Hepatic Neg liver ROS, GERD-  Medicated and Controlled,  Endo/Other  Hypothyroidism   Renal/GU negative Renal ROS  negative genitourinary   Musculoskeletal negative musculoskeletal ROS (+)   Abdominal   Peds negative pediatric ROS (+)  Hematology negative hematology ROS (+)   Anesthesia Other Findings   Reproductive/Obstetrics negative OB ROS                            Anesthesia Physical Anesthesia Plan  ASA: III  Anesthesia Plan: General   Post-op Pain Management:    Induction: Intravenous  Airway Management Planned: LMA  Additional Equipment:   Intra-op Plan:   Post-operative Plan:   Informed Consent:   Plan Discussed with: Surgeon  Anesthesia Plan Comments:         Anesthesia Quick Evaluation

## 2014-04-16 ENCOUNTER — Encounter (HOSPITAL_COMMUNITY): Payer: Self-pay | Admitting: Urology

## 2014-04-19 ENCOUNTER — Other Ambulatory Visit (INDEPENDENT_AMBULATORY_CARE_PROVIDER_SITE_OTHER): Payer: BC Managed Care – PPO | Admitting: *Deleted

## 2014-04-19 DIAGNOSIS — I10 Essential (primary) hypertension: Secondary | ICD-10-CM

## 2014-04-19 DIAGNOSIS — E785 Hyperlipidemia, unspecified: Secondary | ICD-10-CM

## 2014-04-19 LAB — BASIC METABOLIC PANEL
BUN: 25 mg/dL — ABNORMAL HIGH (ref 6–23)
CO2: 29 mEq/L (ref 19–32)
Calcium: 9 mg/dL (ref 8.4–10.5)
Chloride: 104 mEq/L (ref 96–112)
Creatinine, Ser: 1.2 mg/dL (ref 0.4–1.5)
GFR: 65.18 mL/min (ref >60.00)
Glucose, Bld: 88 mg/dL (ref 70–99)
Potassium: 4.1 mEq/L (ref 3.5–5.1)
Sodium: 140 mEq/L (ref 135–145)

## 2014-04-19 LAB — LIPID PANEL
Cholesterol: 124 mg/dL (ref 0–200)
HDL: 35.2 mg/dL — ABNORMAL LOW (ref >39.00)
LDL Cholesterol: 58 mg/dL (ref 0–99)
NonHDL: 88.8
Total CHOL/HDL Ratio: 4
Triglycerides: 156 mg/dL — ABNORMAL HIGH (ref 0.0–149.0)
VLDL: 31.2 mg/dL (ref 0.0–40.0)

## 2014-04-19 LAB — ALT: ALT: 17 U/L (ref 0–53)

## 2014-04-29 ENCOUNTER — Other Ambulatory Visit (INDEPENDENT_AMBULATORY_CARE_PROVIDER_SITE_OTHER): Payer: BC Managed Care – PPO

## 2014-04-29 DIAGNOSIS — E890 Postprocedural hypothyroidism: Secondary | ICD-10-CM

## 2014-04-29 LAB — T4, FREE: Free T4: 0.76 ng/dL (ref 0.60–1.60)

## 2014-04-29 LAB — TSH: TSH: 1.2 u[IU]/mL (ref 0.35–4.50)

## 2014-06-14 ENCOUNTER — Other Ambulatory Visit: Payer: Self-pay

## 2014-06-14 ENCOUNTER — Encounter: Payer: Self-pay | Admitting: Endocrinology

## 2014-06-14 ENCOUNTER — Ambulatory Visit (INDEPENDENT_AMBULATORY_CARE_PROVIDER_SITE_OTHER): Payer: BC Managed Care – PPO | Admitting: Endocrinology

## 2014-06-14 VITALS — BP 126/84 | HR 62 | Temp 97.7°F | Ht 71.0 in | Wt 271.0 lb

## 2014-06-14 DIAGNOSIS — E890 Postprocedural hypothyroidism: Secondary | ICD-10-CM

## 2014-06-14 LAB — T4, FREE: Free T4: 0.86 ng/dL (ref 0.60–1.60)

## 2014-06-14 LAB — TSH: TSH: 2.68 u[IU]/mL (ref 0.35–4.50)

## 2014-06-14 MED ORDER — LEVOTHYROXINE SODIUM 150 MCG PO TABS: 150.0000 ug | ORAL_TABLET | Freq: Every day | ORAL | Status: DC

## 2014-06-14 NOTE — Progress Notes (Signed)
Subjective:    Patient ID: Blake Carter, male    DOB: [birth year], 64 y.o.   MRN: [id number]  HPI Pt returns for f/u of post-I-131 hypothyroidism (he had XRT for enlarged adenoids as a child; he had resection of his thyroid isthmus in 1976, for a "cold" nodule (benign); in July of 2014, pt had i-131 rx for hyperthyroidism due to grave's dz; in September of 2014, he was noted to have elevated TSH, so synthroid was rx'ed; synthroid dosage has been as high as 200 mcg/day).   pt states he feels well in general, except for weight gain. Past Medical History  Diagnosis Date  . Hypertension   . Hyperlipidemia     under control  . Environmental allergies     dry cough  . OCD (obsessive compulsive disorder)   . SUI (stress urinary incontinence), male     S/P ROBOTIC PROSTATECTOMY AND RADIATION [place]  . Thyroid disease   . Depression   . Panic disorder   . GERD (gastroesophageal reflux disease)   . Allergic rhinitis   . ED (erectile dysfunction)   . Cancer     prostate cancer- robotic prostatectomy - followed by radiation because of positive lymph node--and pt on lupron injections  . PONV (postoperative nausea and vomiting)     after thyroid surgery no problems in 10 years   . Bradycardia     usually in 50's  . History of hiatal hernia   . Hydronephrosis of right kidney     Past Surgical History  Procedure Laterality Date  . Robotic prostatectomy  jan 2012  . Thyroidectomy, partial  june 1976  . Cystoscopy  11/30/2011    Procedure: CYSTOSCOPY;  Surgeon: Reece Packer, MD;  Location: [facility];  Service: Urology;  Laterality: N/A;  Inplantation of Artificial Sphincter and Cystoscopy  . Urinary sphincter implant  11/30/2011    Procedure: ARTIFICIAL URINARY SPHINCTER;  Surgeon: Reece Packer, MD;  Location: [facility];  Service: Urology;  Laterality: N/A;  . Thyroidectomy  2014    "nuked it"  . Upper gi endoscopy      food impaction 2009 done by Dr Oletta Lamas  . Eye surgery  2004   BILATERAL LASIK EYE SURGERY  . Tonsillectomy  1956   . Colonoscopy  07/2013    Dr. Oletta Lamas  . Cystoscopy with retrograde pyelogram, ureteroscopy and stent placement Right 02/11/2014    Procedure: CYSTOSCOPY WITH RETROGRADE PYELOGRAM, URETEROSCOPY ,URETERAL BIOPSY AND STENT PLACEMENT;  Surgeon: Raynelle Bring, MD;  Location: [facility];  Service: Urology;  Laterality: Right;  . Cystoscopy with retrograde pyelogram, ureteroscopy and stent placement Right 04/15/2014    Procedure: [place], URETEROSCOPY, BIOPSY AND STENT PLACEMENT;  Surgeon: Raynelle Bring, MD;  Location: [facility];  Service: Urology;  Laterality: Right;    History   Social History  . Marital Status: Married    Spouse Name: N/A    Number of Children: N/A  . Years of Education: N/A   Occupational History  . Not on file.   Social History Main Topics  . Smoking status: Never Smoker   . Smokeless tobacco: Never Used  . Alcohol Use: Yes     Comment: occas  . Drug Use: No  . Sexual Activity: Not on file   Other Topics Concern  . Not on file   Social History Narrative    Current Outpatient Prescriptions on File Prior to Visit  Medication Sig Dispense Refill  . amLODipine (NORVASC) 2.5 MG  tablet Take 1 tablet (2.5 mg total) by mouth daily. (Patient taking differently: Take 2.5 mg by mouth every morning. ) 180 tablet 3  . amLODipine (NORVASC) 5 MG tablet Take 1 tablet (5 mg total) by mouth every morning. 45 tablet 6  . atorvastatin (LIPITOR) 40 MG tablet Take 1 tablet (40 mg total) by mouth every morning. 90 tablet 3  . diazepam (VALIUM) 5 MG tablet Take 5 mg by mouth every 6 (six) hours as needed for anxiety.    Marland Kitchen esomeprazole (NEXIUM) 40 MG capsule Take 40 mg by mouth daily before breakfast.    . fluvoxaMINE (LUVOX) 50 MG tablet Take 150 mg by mouth every morning.     Marland Kitchen gemfibrozil (LOPID) 600 MG tablet Take 600 mg by mouth 2 (two) times daily.     Marland Kitchen HYDROcodone-acetaminophen (NORCO/VICODIN) 5-325 MG  per tablet Take 1-2 tablets by mouth every 6 (six) hours as needed. 25 tablet 0  . lisinopril (PRINIVIL,ZESTRIL) 20 MG tablet Take 20 mg by mouth every morning.     . neomycin-bacitracin-polymyxin (NEOSPORIN) ointment Apply 1 application topically 2 (two) times daily as needed for wound care. apply to eye    . zolpidem (AMBIEN) 10 MG tablet Take 10 mg by mouth at bedtime as needed for sleep.      No current facility-administered medications on file prior to visit.    Allergies  Allergen Reactions  . Niacin And Related Other (See Comments)    Flushing   . Tagamet [Cimetidine] Other (See Comments)    unknown    Family History  Problem Relation Age of Onset  . Cancer Father   . Hyperlipidemia Father   . Heart disease Father     BP 126/84 mmHg  Pulse 62  Temp(Src) 97.7 F (36.5 C) (Oral)  Ht 5\' 11"  (1.803 m)  Wt 271 lb (122.925 kg)  BMI 37.81 kg/m2  SpO2 97%    Review of Systems Denies tremor    Objective:   Physical Exam VITAL SIGNS:  See vs page GENERAL: no distress Skin: not diaphoretic Neuro: no tremor.   Lab Results  Component Value Date   TSH 2.68 06/14/2014      Assessment & Plan:  Post-I-131 hypothyroidism, well-replaced.  Same rx is advised  Patient is advised the following: Patient Instructions  blood tests are being requested for you today.  We'll let you know about the results.            Levothyroxine tablets What is this medicine? LEVOTHYROXINE (lee voe thye ROX een) is a thyroid hormone. This medicine can improve symptoms of thyroid deficiency such as slow speech, lack of energy, weight gain, hair loss, dry skin, and feeling cold. It also helps to treat goiter (an enlarged thyroid gland). It is also used to treat some kinds of thyroid cancer along with surgery and other medicines. This medicine may be used for other purposes; ask your health care provider or pharmacist if you have questions. COMMON BRAND NAME(S): Estre, Levo-T, Levothroid,  Levoxyl, Synthroid, Thyro-Tabs, Unithroid What should I tell my health care provider before I take this medicine? They need to know if you have any of these conditions: -angina -blood clotting problems -diabetes -dieting or on a weight loss program -fertility problems -heart disease -high levels of thyroid hormone -pituitary gland problem -previous heart attack -an unusual or allergic reaction to levothyroxine, thyroid hormones, other medicines, foods, dyes, or preservatives -pregnant or trying to get pregnant -breast-feeding How should I use this medicine?  Take this medicine by mouth with plenty of water. It is best to take on an empty stomach, at least 30 minutes before or 2 hours after food. Follow the directions on the prescription label. Take at the same time each day. Do not take your medicine more often than directed. Contact your pediatrician regarding the use of this medicine in children. While this drug may be prescribed for children and infants as young as a few days of age for selected conditions, precautions do apply. For infants, you may crush the tablet and place in a small amount of (5-10 ml or 1 to 2 teaspoonfuls) of water, breast milk, or non-soy based infant formula. Do not mix with soy-based infant formula. Give as directed. Overdosage: If you think you have taken too much of this medicine contact a poison control center or emergency room at once. NOTE: This medicine is only for you. Do not share this medicine with others. What if I miss a dose? If you miss a dose, take it as soon as you can. If it is almost time for your next dose, take only that dose. Do not take double or extra doses. What may interact with this medicine? -amiodarone -antacids -anti-thyroid medicines -calcium supplements -carbamazepine -cholestyramine -colestipol -digoxin -male hormones, including contraceptive or birth control pills -iron supplements -ketamine -liquid nutrition products  like Ensure -medicines for colds and breathing difficulties -medicines for diabetes -medicines for mental depression -medicines or herbals used to decrease weight or appetite -phenobarbital or other barbiturate medications -phenytoin -prednisone or other corticosteroids -rifabutin -rifampin -soy isoflavones -sucralfate -theophylline -warfarin This list may not describe all possible interactions. Give your health care provider a list of all the medicines, herbs, non-prescription drugs, or dietary supplements you use. Also tell them if you smoke, drink alcohol, or use illegal drugs. Some items may interact with your medicine. What should I watch for while using this medicine? Be sure to take this medicine with plenty of fluids. Some tablets may cause choking, gagging, or difficulty swallowing from the tablet getting stuck in your throat. Most of these problems disappear if the medicine is taken with the right amount of water or other fluids. Do not switch brands of this medicine unless your health care professional agrees with the change. Ask questions if you are uncertain. You will need regular exams and occasional blood tests to check the response to treatment. If you are receiving this medicine for an underactive thyroid, it may be several weeks before you notice an improvement. Check with your doctor or health care professional if your symptoms do not improve. It may be necessary for you to take this medicine for the rest of your life. Do not stop using this medicine unless your doctor or health care professional advises you to. This medicine can affect blood sugar levels. If you have diabetes, check your blood sugar as directed. You may lose some of your hair when you first start treatment. With time, this usually corrects itself. If you are going to have surgery, tell your doctor or health care professional that you are taking this medicine. What side effects may I notice from receiving  this medicine? Side effects that you should report to your doctor or health care professional as soon as possible: -allergic reactions like skin rash, itching or hives, swelling of the face, lips, or tongue -chest pain -excessive sweating or intolerance to heat -fast or irregular heartbeat -nervousness -skin rash or hives -swelling of ankles, feet, or legs -tremors Side effects  that usually do not require medical attention (report to your doctor or health care professional if they continue or are bothersome): -changes in appetite -changes in menstrual periods -diarrhea -hair loss -headache -trouble sleeping -weight loss This list may not describe all possible side effects. Call your doctor for medical advice about side effects. You may report side effects to FDA at 1-800-FDA-1088. Where should I keep my medicine? Keep out of the reach of children. Store at room temperature between 15 and 30 degrees C (59 and 86 degrees F). Protect from light and moisture. Keep container tightly closed. Throw away any unused medicine after the expiration date. NOTE: This sheet is a summary. It may not cover all possible information. If you have questions about this medicine, talk to your doctor, pharmacist, or health care provider.  2015, [employer] Standard. (2008-08-23 14:28:07)

## 2014-06-14 NOTE — Patient Instructions (Addendum)
blood tests are being requested for you today.  We'll let you know about the results.            Levothyroxine tablets What is this medicine? LEVOTHYROXINE (lee voe thye ROX een) is a thyroid hormone. This medicine can improve symptoms of thyroid deficiency such as slow speech, lack of energy, weight gain, hair loss, dry skin, and feeling cold. It also helps to treat goiter (an enlarged thyroid gland). It is also used to treat some kinds of thyroid cancer along with surgery and other medicines. This medicine may be used for other purposes; ask your health care provider or pharmacist if you have questions. COMMON BRAND NAME(S): Estre, Levo-T, Levothroid, Levoxyl, Synthroid, Thyro-Tabs, Unithroid What should I tell my health care provider before I take this medicine? They need to know if you have any of these conditions: -angina -blood clotting problems -diabetes -dieting or on a weight loss program -fertility problems -heart disease -high levels of thyroid hormone -pituitary gland problem -previous heart attack -an unusual or allergic reaction to levothyroxine, thyroid hormones, other medicines, foods, dyes, or preservatives -pregnant or trying to get pregnant -breast-feeding How should I use this medicine? Take this medicine by mouth with plenty of water. It is best to take on an empty stomach, at least 30 minutes before or 2 hours after food. Follow the directions on the prescription label. Take at the same time each day. Do not take your medicine more often than directed. Contact your pediatrician regarding the use of this medicine in children. While this drug may be prescribed for children and infants as young as a few days of age for selected conditions, precautions do apply. For infants, you may crush the tablet and place in a small amount of (5-10 ml or 1 to 2 teaspoonfuls) of water, breast milk, or non-soy based infant formula. Do not mix with soy-based infant formula. Give as  directed. Overdosage: If you think you have taken too much of this medicine contact a poison control center or emergency room at once. NOTE: This medicine is only for you. Do not share this medicine with others. What if I miss a dose? If you miss a dose, take it as soon as you can. If it is almost time for your next dose, take only that dose. Do not take double or extra doses. What may interact with this medicine? -amiodarone -antacids -anti-thyroid medicines -calcium supplements -carbamazepine -cholestyramine -colestipol -digoxin -male hormones, including contraceptive or birth control pills -iron supplements -ketamine -liquid nutrition products like Ensure -medicines for colds and breathing difficulties -medicines for diabetes -medicines for mental depression -medicines or herbals used to decrease weight or appetite -phenobarbital or other barbiturate medications -phenytoin -prednisone or other corticosteroids -rifabutin -rifampin -soy isoflavones -sucralfate -theophylline -warfarin This list may not describe all possible interactions. Give your health care provider a list of all the medicines, herbs, non-prescription drugs, or dietary supplements you use. Also tell them if you smoke, drink alcohol, or use illegal drugs. Some items may interact with your medicine. What should I watch for while using this medicine? Be sure to take this medicine with plenty of fluids. Some tablets may cause choking, gagging, or difficulty swallowing from the tablet getting stuck in your throat. Most of these problems disappear if the medicine is taken with the right amount of water or other fluids. Do not switch brands of this medicine unless your health care professional agrees with the change. Ask questions if you are uncertain. You will need  regular exams and occasional blood tests to check the response to treatment. If you are receiving this medicine for an underactive thyroid, it may be  several weeks before you notice an improvement. Check with your doctor or health care professional if your symptoms do not improve. It may be necessary for you to take this medicine for the rest of your life. Do not stop using this medicine unless your doctor or health care professional advises you to. This medicine can affect blood sugar levels. If you have diabetes, check your blood sugar as directed. You may lose some of your hair when you first start treatment. With time, this usually corrects itself. If you are going to have surgery, tell your doctor or health care professional that you are taking this medicine. What side effects may I notice from receiving this medicine? Side effects that you should report to your doctor or health care professional as soon as possible: -allergic reactions like skin rash, itching or hives, swelling of the face, lips, or tongue -chest pain -excessive sweating or intolerance to heat -fast or irregular heartbeat -nervousness -skin rash or hives -swelling of ankles, feet, or legs -tremors Side effects that usually do not require medical attention (report to your doctor or health care professional if they continue or are bothersome): -changes in appetite -changes in menstrual periods -diarrhea -hair loss -headache -trouble sleeping -weight loss This list may not describe all possible side effects. Call your doctor for medical advice about side effects. You may report side effects to FDA at 1-800-FDA-1088. Where should I keep my medicine? Keep out of the reach of children. Store at room temperature between 15 and 30 degrees C (59 and 86 degrees F). Protect from light and moisture. Keep container tightly closed. Throw away any unused medicine after the expiration date. NOTE: This sheet is a summary. It may not cover all possible information. If you have questions about this medicine, talk to your doctor, pharmacist, or health care provider.  2015,  [employer]. (2008-08-23 14:28:07)

## 2014-06-17 ENCOUNTER — Telehealth: Payer: Self-pay | Admitting: Cardiology

## 2014-06-17 NOTE — Telephone Encounter (Signed)
error 

## 2014-06-18 MED ORDER — AMLODIPINE BESYLATE 5 MG PO TABS: 5.0000 mg | ORAL_TABLET | Freq: Every morning | ORAL | Status: DC

## 2014-06-18 NOTE — Telephone Encounter (Signed)
New Msg         Pt would like a call back, states its not an emergency but didn't give any details.

## 2014-06-18 NOTE — Telephone Encounter (Signed)
Patient st he runs out of breath when he doesn't think he should.  He st he is in no acute stress, but the SOB is more noticeable now and he st Dr. Radford Pax told him to call if it gets worse. Patient denies swelling, but he does say he gained a few pounds over Christmas when he was at the beach. He st he has no SOB while at rest, but can not go up more than 2 flights of stairs without feeling slightly winded.  To Dr. Radford Pax for review and recommendations.   Patient also requests refill for Amlodipine 5 mg called to [facility]. Done.

## 2014-06-19 ENCOUNTER — Ambulatory Visit (INDEPENDENT_AMBULATORY_CARE_PROVIDER_SITE_OTHER)
Admission: RE | Admit: 2014-06-19 | Discharge: 2014-06-19 | Disposition: A | Discharge status: Home / Self Care | Payer: BLUE CROSS/BLUE SHIELD | Source: Ambulatory Visit | Attending: Physician Assistant | Admitting: Physician Assistant

## 2014-06-19 ENCOUNTER — Encounter: Payer: Self-pay | Admitting: Physician Assistant

## 2014-06-19 ENCOUNTER — Ambulatory Visit (INDEPENDENT_AMBULATORY_CARE_PROVIDER_SITE_OTHER): Payer: BLUE CROSS/BLUE SHIELD | Admitting: Physician Assistant

## 2014-06-19 VITALS — BP 112/78 | HR 49 | Ht 71.0 in | Wt 268.1 lb

## 2014-06-19 DIAGNOSIS — R0602 Shortness of breath: Secondary | ICD-10-CM

## 2014-06-19 DIAGNOSIS — C61 Malignant neoplasm of prostate: Secondary | ICD-10-CM | POA: Insufficient documentation

## 2014-06-19 DIAGNOSIS — R001 Bradycardia, unspecified: Secondary | ICD-10-CM

## 2014-06-19 DIAGNOSIS — R072 Precordial pain: Secondary | ICD-10-CM

## 2014-06-19 DIAGNOSIS — R059 Cough, unspecified: Secondary | ICD-10-CM

## 2014-06-19 DIAGNOSIS — I2510 Atherosclerotic heart disease of native coronary artery without angina pectoris: Secondary | ICD-10-CM

## 2014-06-19 DIAGNOSIS — R05 Cough: Secondary | ICD-10-CM

## 2014-06-19 DIAGNOSIS — N1330 Unspecified hydronephrosis: Secondary | ICD-10-CM | POA: Insufficient documentation

## 2014-06-19 DIAGNOSIS — E785 Hyperlipidemia, unspecified: Secondary | ICD-10-CM

## 2014-06-19 DIAGNOSIS — I10 Essential (primary) hypertension: Secondary | ICD-10-CM

## 2014-06-19 DIAGNOSIS — I77810 Thoracic aortic ectasia: Secondary | ICD-10-CM

## 2014-06-19 NOTE — Progress Notes (Signed)
Cardiology Office Note  Date:  06/19/2014   Patient ID:  Blake, Carter [birth year], MRN [id number]  PCP:  Gennette Pac, MD  Cardiologist:  Radford Pax   Chief Complaint: dyspnea on exertion  History of Present Illness: Blake Carter is a 64 y.o. male with history of HTN, dyslipidemia, OCD, dilated aortic root, hypothyroidism (h/o RAI) and signficant family history of CAD who presents as an add-on to clinic for evaluation of dyspnea. 2D echo 10/2013: EF 55-60%, grade 1 DD, aortic sclerosis, ascending aortic diameter 2mm, aortic root dimension 63mm, mildly dilated LA. CT angio for additional measurement showed ectasia up to 3.9cm of the ascending thoracic aorta (Dr. Radford Pax plans to repeat echo in 10/2014). However, chest CT also showed coronary artery calcifications so a nuclear stress test was done which showed a high risk stress nuclear study with two reversible perfusion defects: a medium size moderate severity defect in the LAD territory and a small size moderate severity defect in the RCA territory. The inferior defect was felt to be a result of significant extracardiac activity. LV Ejection Fraction: 55% with normal wall motion. He was initially completely asymptomatic and was managed medically, surveilling for further symptoms.  He presents with increased DOE over the last several weeks. He is not sure if this is because he is rather inactive, but he is concerned given his abnormal stress test and family history of CAD. His brother (who is 6 years younger) is s/p stenting, and father had CABG/aortic repair by the time he was this age. The dyspnea has been present for quite some time but more recently he is noticing it come on with earlier levels of exertion. He does endorse a vague chest dicomfort on the right side near the axilla which has no relation to the dyspnea, occurs at random times, and is not necessary worse with exertion. There is no rhyme or reason to when it occurs. It  is made better by moving or repositioning his right arm. He denies any chest pain at rest. He denies any diaphoresis, nausea, vomiting, LEE, LE erythema, orthopnea or syncope. He endorses a dry cough for the past several weeks with exertion. He thinks he's gained some weight due to eating less healthy over the holidays but no abrupt increase. Weight stable by our scales today. He is not tachycardic, tachypnic or hypoxic.   Past Medical History  Diagnosis Date  . Hypertension   . Hyperlipidemia     under control  . Environmental allergies     dry cough  . OCD (obsessive compulsive disorder)   . SUI (stress urinary incontinence), male     S/P ROBOTIC PROSTATECTOMY AND RADIATION TX  . Hypothyroidism   . Depression   . Panic disorder   . GERD (gastroesophageal reflux disease)   . Allergic rhinitis   . ED (erectile dysfunction)   . Prostate cancer     prostate cancer- robotic prostatectomy - followed by radiation because of positive lymph node--and pt on lupron injections  . PONV (postoperative nausea and vomiting)     after thyroid surgery no problems in 10 years   . Sinus bradycardia     usually in 50's  . History of hiatal hernia   . Hydronephrosis of right kidney     a. s/p ureteral stenting in the past.    Past Surgical History  Procedure Laterality Date  . Robotic prostatectomy  jan 2012  . Thyroidectomy, partial  june 1976  . Cystoscopy  11/30/2011  Procedure: CYSTOSCOPY;  Surgeon: Reece Packer, MD;  Location: [facility];  Service: Urology;  Laterality: N/A;  Inplantation of Artificial Sphincter and Cystoscopy  . Urinary sphincter implant  11/30/2011    Procedure: ARTIFICIAL URINARY SPHINCTER;  Surgeon: Reece Packer, MD;  Location: [facility];  Service: Urology;  Laterality: N/A;  . Thyroidectomy  2014    "nuked it"  . Upper gi endoscopy      food impaction 2009 done by Dr Oletta Lamas  . Eye surgery  2004    BILATERAL LASIK EYE SURGERY  . Tonsillectomy  1956   .  Colonoscopy  07/2013    Dr. Oletta Lamas  . Cystoscopy with retrograde pyelogram, ureteroscopy and stent placement Right 02/11/2014    Procedure: CYSTOSCOPY WITH RETROGRADE PYELOGRAM, URETEROSCOPY ,URETERAL BIOPSY AND STENT PLACEMENT;  Surgeon: Raynelle Bring, MD;  Location: [facility];  Service: Urology;  Laterality: Right;  . Cystoscopy with retrograde pyelogram, ureteroscopy and stent placement Right 04/15/2014    Procedure: [place], URETEROSCOPY, BIOPSY AND STENT PLACEMENT;  Surgeon: Raynelle Bring, MD;  Location: [facility];  Service: Urology;  Laterality: Right;    Current Outpatient Prescriptions  Medication Sig Dispense Refill  . amLODipine (NORVASC) 2.5 MG tablet Take 1 tablet (2.5 mg total) by mouth daily. (Patient taking differently: Take 2.5 mg by mouth every morning. ) 180 tablet 3  . amLODipine (NORVASC) 5 MG tablet Take 1 tablet (5 mg total) by mouth every morning. 90 tablet 3  . aspirin 81 MG tablet Take 81 mg by mouth daily.    Marland Kitchen atorvastatin (LIPITOR) 40 MG tablet Take 1 tablet (40 mg total) by mouth every morning. 90 tablet 3  . diazepam (VALIUM) 5 MG tablet Take 5 mg by mouth every 6 (six) hours as needed for anxiety.    Marland Kitchen esomeprazole (NEXIUM) 40 MG capsule Take 40 mg by mouth daily before breakfast.    . fluvoxaMINE (LUVOX) 50 MG tablet Take 150 mg by mouth every morning.     Marland Kitchen gemfibrozil (LOPID) 600 MG tablet Take 600 mg by mouth 2 (two) times daily.     Marland Kitchen HYDROcodone-acetaminophen (NORCO/VICODIN) 5-325 MG per tablet Take 1-2 tablets by mouth every 6 (six) hours as needed. 25 tablet 0  . levothyroxine (SYNTHROID, LEVOTHROID) 150 MCG tablet Take 1 tablet (150 mcg total) by mouth daily before breakfast. 90 tablet 1  . lisinopril (PRINIVIL,ZESTRIL) 20 MG tablet Take 20 mg by mouth every morning.     . zolpidem (AMBIEN) 10 MG tablet Take 10 mg by mouth at bedtime as needed for sleep.      No current facility-administered medications for this visit.     Allergies:   Niacin and related and Tagamet   Social History:  The patient  reports that he has never smoked. He has never used smokeless tobacco. He reports that he drinks alcohol. He reports that he does not use illicit drugs.   Family History:  The patient's family history includes CAD in his brother; Cancer in his father; Heart disease in his father; Hyperlipidemia in his father.  ROS:  Please see the history of present illness. Otherwise, review of systems is positive for dry cough. No hemoptysis. All other systems are reviewed and otherwise negative.   PHYSICAL EXAM:  VS:  BP 112/78 mmHg  Pulse 49  Ht 5\' 11"  (1.803 m)  Wt 268 lb 1.9 oz (121.618 kg)  BMI 37.41 kg/m2  SpO2 98% BMI: Body mass index is 37.41 kg/(m^2). Well nourished,  well developed - well appearing WM, in no acute distress HEENT: normocephalic, atraumatic Neck: no JVD Cardiac:  normal S1, S2; RRR; no murmur Lungs:  clear to auscultation bilaterally, no wheezing, rhonchi or rales Abd: soft, nontender, no hepatomegaly Ext: no edema Skin: warm and dry Neuro:  moves all extremities spontaneously, no focal abnormalities noted  EKG:  Sinus bradycardia with sinus arrhythmia 49bpm, no acute ST-T changes, general TW flattening V2/avL, possible prior septal infarct. No sig change from prior. (This was ordered due to dyspnea)  Recent Labs: 04/12/2014: Hemoglobin 12.9*; Platelets 217 04/19/2014: ALT 17; BUN 25*; Creatinine 1.2; Potassium 4.1; Sodium 140 06/14/2014: TSH 2.68  04/19/2014: Cholesterol, Total 124; HDL Cholesterol by NMR 35.20*; LDL (calc) 58; Total CHOL/HDL Ratio 4; Triglycerides 156.0*; VLDL 31.2   CrCl cannot be calculated (Patient has no serum creatinine result on file.).   Wt Readings from Last 3 Encounters:  06/19/14 268 lb 1.9 oz (121.618 kg)  06/14/14 271 lb (122.925 kg)  04/15/14 266 lb 12.8 oz (121.02 kg)     Other studies reviewed: Additional studies/records reviewed today include: 2D  echo 10/2013 - Left ventricle: The cavity size was normal. There was mild focal basal hypertrophy of the septum. Systolic function was normal. The estimated ejection fraction was in the range of 55% to 60%. Wall motion was normal; there were no regional wall motion abnormalities. There was an increased relative contribution of atrial contraction to ventricular filling. Doppler parameters are consistent with abnormal left ventricular relaxation (grade 1 diastolic dysfunction). - Aortic valve: Moderate thickening and calcification, consistent with sclerosis. - Aorta: Aortic root dimension: 38 mm (ED). Ascending aortic diameter: 41 mm (S). - Ascending aorta: The ascending aorta was mildly dilated. - Left atrium: The atrium was mildly dilated. - Right ventricle: The cavity size was mildly dilated. Wall thickness was normal.  CTA 11/2013 - IMPRESSION: Ectasia of the thoracic aorta, measuring up to 3.9 cm in the ascending thoracic aorta, as above. No evidence of PE. Minimal atx.  ASSESSMENT AND PLAN:  1. Dyspnea on exertion with history of abnormal stress test and coronary calcifications on CT - Dr. Radford Pax recommended today's appointment to discuss cardiac catheterization and I agree this is appropriate. His chest pain is quite atypical but DOE warrants further investigation in light of risk factors. Risks and benefits of cardiac catheterization have been discussed with the patient.These include bleeding, infection, kidney damage, stroke, heart attack, death. The patient understands these risks and is willing to proceed. This was scheduled for tomorrow. Continue aspirin and statin. Not on BB due to h/o baseline sinus bradycardia. ER precautions reviewed. Will obtain baseline labs as well as CXR today. He is not tachycardic, tachypnic, hypoxic, and has no signs of DVT so PE is felt less likely. If cath unrevealing he is eager for reassurance to get back into regular activity to try  to increase endurance. 2. Essential HTN - controlled. 3. Dyslipidemia - continue statin. If CAD present on cath, would escalate statin. 4. Dilated aortic root - Dr. Theodosia Blender prior notes indicate she plans f/u echo 10/2014. 5. Dry cough - if no significant findings on cath and this persists, would consider changing ACEI to ARB. Will also check baseline CXR given interval development of cough and dyspnea. 6. Sinus bradycardia - chronic, asymptomatic.   Disposition: F/u with Dr. Radford Pax or APP 2 weeks post-cath.  Current medicines are reviewed at length with the patient today.  The patient did not have any concerns regarding medicines.  Signed, Lisbeth Renshaw  Corbet Hanley PA-C 06/19/2014 4:58 PM     [address] [place] [address] [phone] (office)  [phone] (fax)

## 2014-06-19 NOTE — Telephone Encounter (Signed)
Confirmed with patient he is still taking ASA. Appointment made with Melina Copa today at 1430 to discuss and schedule catheterization. Patient agrees with treatment plan.

## 2014-06-19 NOTE — Patient Instructions (Addendum)
CONTINUE ASPIRIN 81 MG DAILY   POST HOSPITAL FOLLOW UP WITH DR TURNER OR NEXT AVAILABLE APP   LABS TODAY CMET PT/INR CBC TSH   YOU HAVE BEEN SCHEDULED FOR A LEFT  HEART CATH @  10:30  ON  06/20/14   WITH DR VARANASI  PLEASE ARRIVE AT THE [facility] @ 8:30    MAKE SURE YOU HAVE NOTHING EAT OR DRINK AFTER MIDNIGHT   ONLY WATER AND MEDS THE MORNING OF PROCEDURE    Your physician has requested that you have a cardiac catheterization. Cardiac catheterization is used to diagnose and/or treat various heart conditions. Doctors may recommend this procedure for a number of different reasons. The most common reason is to evaluate chest pain. Chest pain can be a symptom of coronary artery disease (CAD), and cardiac catheterization can show whether plaque is narrowing or blocking your heart's arteries. This procedure is also used to evaluate the valves, as well as measure the blood flow and oxygen levels in different parts of your heart. For further information please visit [facility]. Please follow instruction sheet, as given. .  A chest x-ray takes a picture of the organs and structures inside the chest, including the heart, lungs, and blood vessels. This test can show several things, including, whether the heart is enlarges; whether fluid is building up in the lungs; and whether pacemaker / defibrillator leads are still in place.  [place]

## 2014-06-19 NOTE — Telephone Encounter (Signed)
Please make sure he is on ASA as I had ordered last OV.  He cannot take BB due to resting bradycardia.

## 2014-06-19 NOTE — Telephone Encounter (Signed)
I would like him to come in to see PA and be set up for cath.  He had an abnormal nuclear stress test done but was completely asymptomatic so we decided to treat medically.  Now that he is having symptoms I would like him to have right and left heart cath.

## 2014-06-20 ENCOUNTER — Ambulatory Visit (HOSPITAL_COMMUNITY)
Admission: RE | Admit: 2014-06-20 | Discharge: 2014-06-21 | Disposition: A | Discharge status: Home / Self Care | Payer: BLUE CROSS/BLUE SHIELD | Source: Ambulatory Visit | Attending: Interventional Cardiology | Admitting: Interventional Cardiology

## 2014-06-20 ENCOUNTER — Encounter (HOSPITAL_COMMUNITY)
Admission: RE | Disposition: A | Discharge status: Home / Self Care | Payer: Self-pay | Source: Ambulatory Visit | Attending: Interventional Cardiology

## 2014-06-20 ENCOUNTER — Encounter (HOSPITAL_COMMUNITY): Payer: Self-pay | Admitting: General Practice

## 2014-06-20 DIAGNOSIS — R001 Bradycardia, unspecified: Secondary | ICD-10-CM | POA: Diagnosis not present

## 2014-06-20 DIAGNOSIS — I77810 Thoracic aortic ectasia: Secondary | ICD-10-CM | POA: Diagnosis not present

## 2014-06-20 DIAGNOSIS — E785 Hyperlipidemia, unspecified: Secondary | ICD-10-CM | POA: Insufficient documentation

## 2014-06-20 DIAGNOSIS — Z7982 Long term (current) use of aspirin: Secondary | ICD-10-CM | POA: Insufficient documentation

## 2014-06-20 DIAGNOSIS — Z79899 Other long term (current) drug therapy: Secondary | ICD-10-CM | POA: Diagnosis not present

## 2014-06-20 DIAGNOSIS — I10 Essential (primary) hypertension: Secondary | ICD-10-CM | POA: Diagnosis present

## 2014-06-20 DIAGNOSIS — I2584 Coronary atherosclerosis due to calcified coronary lesion: Secondary | ICD-10-CM | POA: Insufficient documentation

## 2014-06-20 DIAGNOSIS — K219 Gastro-esophageal reflux disease without esophagitis: Secondary | ICD-10-CM | POA: Diagnosis not present

## 2014-06-20 DIAGNOSIS — R0602 Shortness of breath: Secondary | ICD-10-CM

## 2014-06-20 DIAGNOSIS — I2510 Atherosclerotic heart disease of native coronary artery without angina pectoris: Secondary | ICD-10-CM

## 2014-06-20 DIAGNOSIS — R0609 Other forms of dyspnea: Secondary | ICD-10-CM | POA: Diagnosis present

## 2014-06-20 DIAGNOSIS — Z8546 Personal history of malignant neoplasm of prostate: Secondary | ICD-10-CM | POA: Diagnosis not present

## 2014-06-20 DIAGNOSIS — E890 Postprocedural hypothyroidism: Secondary | ICD-10-CM | POA: Insufficient documentation

## 2014-06-20 DIAGNOSIS — Z8249 Family history of ischemic heart disease and other diseases of the circulatory system: Secondary | ICD-10-CM | POA: Diagnosis not present

## 2014-06-20 DIAGNOSIS — Z955 Presence of coronary angioplasty implant and graft: Secondary | ICD-10-CM

## 2014-06-20 DIAGNOSIS — F329 Major depressive disorder, single episode, unspecified: Secondary | ICD-10-CM | POA: Diagnosis not present

## 2014-06-20 DIAGNOSIS — R0600 Dyspnea, unspecified: Secondary | ICD-10-CM | POA: Diagnosis present

## 2014-06-20 HISTORY — DX: Atherosclerotic heart disease of native coronary artery without angina pectoris: I25.10

## 2014-06-20 HISTORY — PX: CORONARY ANGIOPLASTY WITH STENT PLACEMENT: SHX49

## 2014-06-20 HISTORY — DX: Unspecified osteoarthritis, unspecified site: M19.90

## 2014-06-20 HISTORY — PX: LEFT HEART CATHETERIZATION WITH CORONARY ANGIOGRAM: SHX5451

## 2014-06-20 HISTORY — PX: PERCUTANEOUS CORONARY STENT INTERVENTION (PCI-S): SHX5485

## 2014-06-20 HISTORY — DX: Pneumonia, unspecified organism: J18.9

## 2014-06-20 LAB — POCT ACTIVATED CLOTTING TIME
Activated Clotting Time: 226 seconds
Activated Clotting Time: 276 seconds
Activated Clotting Time: 251 seconds
Activated Clotting Time: 251 seconds

## 2014-06-20 LAB — CBC
HCT: 40 % (ref 39.0–52.0)
Hemoglobin: 13.5 g/dL (ref 13.0–17.0)
MCH: 29.7 pg (ref 26.0–34.0)
MCHC: 33.8 g/dL (ref 30.0–36.0)
MCV: 88.1 fL (ref 78.0–100.0)
Platelets: 251 10*3/uL (ref 150–400)
RBC: 4.54 MIL/uL (ref 4.22–5.81)
RDW: 13.9 % (ref 11.5–15.5)
WBC: 5.7 10*3/uL (ref 4.0–10.5)

## 2014-06-20 LAB — BASIC METABOLIC PANEL
Anion gap: 10 (ref 5–15)
BUN: 19 mg/dL (ref 6–23)
CO2: 25 mmol/L (ref 19–32)
Calcium: 9.6 mg/dL (ref 8.4–10.5)
Chloride: 106 mEq/L (ref 96–112)
Creatinine, Ser: 0.99 mg/dL (ref 0.50–1.35)
GFR calc Af Amer: >90 mL/min (ref >90)
GFR calc non Af Amer: 86 mL/min — ABNORMAL LOW (ref >90)
Glucose, Bld: 95 mg/dL (ref 70–99)
Potassium: 4.4 mmol/L (ref 3.5–5.1)
Sodium: 141 mmol/L (ref 135–145)

## 2014-06-20 LAB — PROTIME-INR
INR: 1.01 (ref 0.00–1.49)
Prothrombin Time: 13.4 seconds (ref 11.6–15.2)

## 2014-06-20 SURGERY — LEFT HEART CATHETERIZATION WITH CORONARY ANGIOGRAM

## 2014-06-20 MED ORDER — ASPIRIN 81 MG PO CHEW
81.0000 mg | CHEWABLE_TABLET | ORAL | Status: AC
  Administered 2014-06-20: 81 mg via ORAL

## 2014-06-20 MED ORDER — VERAPAMIL HCL 2.5 MG/ML IV SOLN
INTRAVENOUS | Status: AC
  Filled 2014-06-20: qty 2

## 2014-06-20 MED ORDER — ONDANSETRON HCL 4 MG/2ML IJ SOLN: 4.0000 mg | Freq: Four times a day (QID) | INTRAMUSCULAR | Status: DC | PRN

## 2014-06-20 MED ORDER — TICAGRELOR 90 MG PO TABS
ORAL_TABLET | ORAL | Status: AC
  Filled 2014-06-20: qty 2

## 2014-06-20 MED ORDER — NITROGLYCERIN 1 MG/10 ML FOR IR/CATH LAB
INTRA_ARTERIAL | Status: AC
  Filled 2014-06-20: qty 10

## 2014-06-20 MED ORDER — LIDOCAINE HCL (PF) 1 % IJ SOLN
INTRAMUSCULAR | Status: AC
  Filled 2014-06-20: qty 30

## 2014-06-20 MED ORDER — HYDROMORPHONE HCL 1 MG/ML IJ SOLN
INTRAMUSCULAR | Status: AC
  Filled 2014-06-20: qty 1

## 2014-06-20 MED ORDER — PANTOPRAZOLE SODIUM 40 MG PO TBEC
40.0000 mg | DELAYED_RELEASE_TABLET | Freq: Every day | ORAL | Status: DC
  Administered 2014-06-21: 10:00:00 40 mg via ORAL
  Filled 2014-06-20: qty 1

## 2014-06-20 MED ORDER — MORPHINE SULFATE 2 MG/ML IJ SOLN: 2.0000 mg | INTRAMUSCULAR | Status: DC | PRN

## 2014-06-20 MED ORDER — ZOLPIDEM TARTRATE 5 MG PO TABS: 10.0000 mg | ORAL_TABLET | Freq: Every evening | ORAL | Status: DC | PRN

## 2014-06-20 MED ORDER — DIAZEPAM 5 MG PO TABS: 5.0000 mg | ORAL_TABLET | Freq: Four times a day (QID) | ORAL | Status: DC | PRN

## 2014-06-20 MED ORDER — AMLODIPINE BESYLATE 2.5 MG PO TABS
2.5000 mg | ORAL_TABLET | Freq: Every day | ORAL | Status: DC
  Administered 2014-06-21: 2.5 mg via ORAL
  Filled 2014-06-20: qty 1

## 2014-06-20 MED ORDER — ATORVASTATIN CALCIUM 40 MG PO TABS
40.0000 mg | ORAL_TABLET | Freq: Every morning | ORAL | Status: DC
  Administered 2014-06-21: 40 mg via ORAL
  Filled 2014-06-20: qty 1

## 2014-06-20 MED ORDER — ASPIRIN 81 MG PO CHEW
CHEWABLE_TABLET | ORAL | Status: AC
  Filled 2014-06-20: qty 1

## 2014-06-20 MED ORDER — HEPARIN SODIUM (PORCINE) 1000 UNIT/ML IJ SOLN
INTRAMUSCULAR | Status: AC
  Filled 2014-06-20: qty 1

## 2014-06-20 MED ORDER — FLUVOXAMINE MALEATE 50 MG PO TABS
150.0000 mg | ORAL_TABLET | Freq: Every morning | ORAL | Status: DC
  Administered 2014-06-21: 10:00:00 150 mg via ORAL
  Filled 2014-06-20: qty 1

## 2014-06-20 MED ORDER — HEPARIN (PORCINE) IN NACL 2-0.9 UNIT/ML-% IJ SOLN
INTRAMUSCULAR | Status: AC
  Filled 2014-06-20: qty 1500

## 2014-06-20 MED ORDER — ACETAMINOPHEN 325 MG PO TABS: 650.0000 mg | ORAL_TABLET | ORAL | Status: DC | PRN

## 2014-06-20 MED ORDER — FENTANYL CITRATE 0.05 MG/ML IJ SOLN
INTRAMUSCULAR | Status: AC
  Filled 2014-06-20: qty 2

## 2014-06-20 MED ORDER — MIDAZOLAM HCL 2 MG/2ML IJ SOLN
INTRAMUSCULAR | Status: AC
  Filled 2014-06-20: qty 2

## 2014-06-20 MED ORDER — GEMFIBROZIL 600 MG PO TABS
600.0000 mg | ORAL_TABLET | Freq: Two times a day (BID) | ORAL | Status: DC
  Administered 2014-06-20 – 2014-06-21 (×2): 600 mg via ORAL
  Filled 2014-06-20 (×3): qty 1

## 2014-06-20 MED ORDER — SODIUM CHLORIDE 0.9 % IV SOLN: 1.0000 mL/kg/h | INTRAVENOUS | Status: AC

## 2014-06-20 MED ORDER — LEVOTHYROXINE SODIUM 150 MCG PO TABS
150.0000 ug | ORAL_TABLET | Freq: Every day | ORAL | Status: DC
  Administered 2014-06-21: 150 ug via ORAL
  Filled 2014-06-20 (×2): qty 1

## 2014-06-20 MED ORDER — LISINOPRIL 20 MG PO TABS
20.0000 mg | ORAL_TABLET | Freq: Every morning | ORAL | Status: DC
  Administered 2014-06-21: 20 mg via ORAL
  Filled 2014-06-20: qty 1

## 2014-06-20 MED ORDER — ASPIRIN 81 MG PO CHEW
81.0000 mg | CHEWABLE_TABLET | Freq: Every day | ORAL | Status: DC
  Administered 2014-06-21: 81 mg via ORAL
  Filled 2014-06-20: qty 1

## 2014-06-20 MED ORDER — ADENOSINE (DIAGNOSTIC) 3 MG/ML IV SOLN
20.0000 mL | Freq: Once | INTRAVENOUS | Status: DC
  Filled 2014-06-20: qty 20

## 2014-06-20 MED ORDER — CLOPIDOGREL BISULFATE 75 MG PO TABS
75.0000 mg | ORAL_TABLET | Freq: Every day | ORAL | Status: DC
  Administered 2014-06-21: 08:00:00 75 mg via ORAL
  Filled 2014-06-20: qty 1

## 2014-06-20 MED ORDER — HYDROCODONE-ACETAMINOPHEN 5-325 MG PO TABS
1.0000 | ORAL_TABLET | Freq: Four times a day (QID) | ORAL | Status: DC | PRN
  Administered 2014-06-20 (×2): 2 via ORAL
  Filled 2014-06-20 (×2): qty 2

## 2014-06-20 MED ORDER — SODIUM CHLORIDE 0.9 % IV SOLN
INTRAVENOUS | Status: DC
  Administered 2014-06-20: 09:00:00 via INTRAVENOUS

## 2014-06-20 MED ORDER — AMLODIPINE BESYLATE 5 MG PO TABS
5.0000 mg | ORAL_TABLET | Freq: Every morning | ORAL | Status: DC
  Administered 2014-06-21: 5 mg via ORAL
  Filled 2014-06-20: qty 1

## 2014-06-20 NOTE — H&P (View-Only) (Signed)
Cardiology Office Note  Date:  06/19/2014   Patient ID:  Blake Carter, Blake Carter July 13, 1950, MRN [id number]  PCP:  Gennette Pac, MD  Cardiologist:  Radford Pax   Chief Complaint: dyspnea on exertion  History of Present Illness: Blake Carter is a 64 y.o. male with history of HTN, dyslipidemia, OCD, dilated aortic root, hypothyroidism (h/o RAI) and signficant family history of CAD who presents as an add-on to clinic for evaluation of dyspnea. 2D echo 10/2013: EF 55-60%, grade 1 DD, aortic sclerosis, ascending aortic diameter 22mm, aortic root dimension 13mm, mildly dilated LA. CT angio for additional measurement showed ectasia up to 3.9cm of the ascending thoracic aorta (Dr. Radford Pax plans to repeat echo in 10/2014). However, chest CT also showed coronary artery calcifications so a nuclear stress test was done which showed a high risk stress nuclear study with two reversible perfusion defects: a medium size moderate severity defect in the LAD territory and a small size moderate severity defect in the RCA territory. The inferior defect was felt to be a result of significant extracardiac activity. LV Ejection Fraction: 55% with normal wall motion. He was initially completely asymptomatic and was managed medically, surveilling for further symptoms.  He presents with increased DOE over the last several weeks. He is not sure if this is because he is rather inactive, but he is concerned given his abnormal stress test and family history of CAD. His brother (who is 5 years younger) is s/p stenting, and father had CABG/aortic repair by the time he was this age. The dyspnea has been present for quite some time but more recently he is noticing it come on with earlier levels of exertion. He does endorse a vague chest dicomfort on the right side near the axilla which has no relation to the dyspnea, occurs at random times, and is not necessary worse with exertion. There is no rhyme or reason to when it occurs. It  is made better by moving or repositioning his right arm. He denies any chest pain at rest. He denies any diaphoresis, nausea, vomiting, LEE, LE erythema, orthopnea or syncope. He endorses a dry cough for the past several weeks with exertion. He thinks he's gained some weight due to eating less healthy over the holidays but no abrupt increase. Weight stable by our scales today. He is not tachycardic, tachypnic or hypoxic.   Past Medical History  Diagnosis Date  . Hypertension   . Hyperlipidemia     under control  . Environmental allergies     dry cough  . OCD (obsessive compulsive disorder)   . SUI (stress urinary incontinence), male     S/P ROBOTIC PROSTATECTOMY AND RADIATION [place]  . Hypothyroidism   . Depression   . Panic disorder   . GERD (gastroesophageal reflux disease)   . Allergic rhinitis   . ED (erectile dysfunction)   . Prostate cancer     prostate cancer- robotic prostatectomy - followed by radiation because of positive lymph node--and pt on lupron injections  . PONV (postoperative nausea and vomiting)     after thyroid surgery no problems in 10 years   . Sinus bradycardia     usually in 50's  . History of hiatal hernia   . Hydronephrosis of right kidney     a. s/p ureteral stenting in the past.    Past Surgical History  Procedure Laterality Date  . Robotic prostatectomy  jan 2012  . Thyroidectomy, partial  june 1976  . Cystoscopy  11/30/2011  Procedure: CYSTOSCOPY;  Surgeon: Reece Packer, MD;  Location: [facility];  Service: Urology;  Laterality: N/A;  Inplantation of Artificial Sphincter and Cystoscopy  . Urinary sphincter implant  11/30/2011    Procedure: ARTIFICIAL URINARY SPHINCTER;  Surgeon: Reece Packer, MD;  Location: [facility];  Service: Urology;  Laterality: N/A;  . Thyroidectomy  2014    "nuked it"  . Upper gi endoscopy      food impaction 2009 done by Dr Oletta Lamas  . Eye surgery  2004    BILATERAL LASIK EYE SURGERY  . Tonsillectomy  1956   .  Colonoscopy  07/2013    Dr. Oletta Lamas  . Cystoscopy with retrograde pyelogram, ureteroscopy and stent placement Right 02/11/2014    Procedure: CYSTOSCOPY WITH RETROGRADE PYELOGRAM, URETEROSCOPY ,URETERAL BIOPSY AND STENT PLACEMENT;  Surgeon: Raynelle Bring, MD;  Location: [facility];  Service: Urology;  Laterality: Right;  . Cystoscopy with retrograde pyelogram, ureteroscopy and stent placement Right 04/15/2014    Procedure: [place], URETEROSCOPY, BIOPSY AND STENT PLACEMENT;  Surgeon: Raynelle Bring, MD;  Location: [facility];  Service: Urology;  Laterality: Right;    Current Outpatient Prescriptions  Medication Sig Dispense Refill  . amLODipine (NORVASC) 2.5 MG tablet Take 1 tablet (2.5 mg total) by mouth daily. (Patient taking differently: Take 2.5 mg by mouth every morning. ) 180 tablet 3  . amLODipine (NORVASC) 5 MG tablet Take 1 tablet (5 mg total) by mouth every morning. 90 tablet 3  . aspirin 81 MG tablet Take 81 mg by mouth daily.    Marland Kitchen atorvastatin (LIPITOR) 40 MG tablet Take 1 tablet (40 mg total) by mouth every morning. 90 tablet 3  . diazepam (VALIUM) 5 MG tablet Take 5 mg by mouth every 6 (six) hours as needed for anxiety.    Marland Kitchen esomeprazole (NEXIUM) 40 MG capsule Take 40 mg by mouth daily before breakfast.    . fluvoxaMINE (LUVOX) 50 MG tablet Take 150 mg by mouth every morning.     Marland Kitchen gemfibrozil (LOPID) 600 MG tablet Take 600 mg by mouth 2 (two) times daily.     Marland Kitchen HYDROcodone-acetaminophen (NORCO/VICODIN) 5-325 MG per tablet Take 1-2 tablets by mouth every 6 (six) hours as needed. 25 tablet 0  . levothyroxine (SYNTHROID, LEVOTHROID) 150 MCG tablet Take 1 tablet (150 mcg total) by mouth daily before breakfast. 90 tablet 1  . lisinopril (PRINIVIL,ZESTRIL) 20 MG tablet Take 20 mg by mouth every morning.     . zolpidem (AMBIEN) 10 MG tablet Take 10 mg by mouth at bedtime as needed for sleep.      No current facility-administered medications for this visit.     Allergies:   Niacin and related and Tagamet   Social History:  The patient  reports that he has never smoked. He has never used smokeless tobacco. He reports that he drinks alcohol. He reports that he does not use illicit drugs.   Family History:  The patient's family history includes CAD in his brother; Cancer in his father; Heart disease in his father; Hyperlipidemia in his father.  ROS:  Please see the history of present illness. Otherwise, review of systems is positive for dry cough. No hemoptysis. All other systems are reviewed and otherwise negative.   PHYSICAL EXAM:  VS:  BP 112/78 mmHg  Pulse 49  Ht 5\' 11"  (1.803 m)  Wt 268 lb 1.9 oz (121.618 kg)  BMI 37.41 kg/m2  SpO2 98% BMI: Body mass index is 37.41 kg/(m^2). Well nourished,  well developed - well appearing WM, in no acute distress HEENT: normocephalic, atraumatic Neck: no JVD Cardiac:  normal S1, S2; RRR; no murmur Lungs:  clear to auscultation bilaterally, no wheezing, rhonchi or rales Abd: soft, nontender, no hepatomegaly Ext: no edema Skin: warm and dry Neuro:  moves all extremities spontaneously, no focal abnormalities noted  EKG:  Sinus bradycardia with sinus arrhythmia 49bpm, no acute ST-T changes, general TW flattening V2/avL, possible prior septal infarct. No sig change from prior. (This was ordered due to dyspnea)  Recent Labs: 04/12/2014: Hemoglobin 12.9*; Platelets 217 04/19/2014: ALT 17; BUN 25*; Creatinine 1.2; Potassium 4.1; Sodium 140 06/14/2014: TSH 2.68  04/19/2014: Cholesterol, Total 124; HDL Cholesterol by NMR 35.20*; LDL (calc) 58; Total CHOL/HDL Ratio 4; Triglycerides 156.0*; VLDL 31.2   CrCl cannot be calculated (Patient has no serum creatinine result on file.).   Wt Readings from Last 3 Encounters:  06/19/14 268 lb 1.9 oz (121.618 kg)  06/14/14 271 lb (122.925 kg)  04/15/14 266 lb 12.8 oz (121.02 kg)     Other studies reviewed: Additional studies/records reviewed today include: 2D  echo 10/2013 - Left ventricle: The cavity size was normal. There was mild focal basal hypertrophy of the septum. Systolic function was normal. The estimated ejection fraction was in the range of 55% to 60%. Wall motion was normal; there were no regional wall motion abnormalities. There was an increased relative contribution of atrial contraction to ventricular filling. Doppler parameters are consistent with abnormal left ventricular relaxation (grade 1 diastolic dysfunction). - Aortic valve: Moderate thickening and calcification, consistent with sclerosis. - Aorta: Aortic root dimension: 38 mm (ED). Ascending aortic diameter: 41 mm (S). - Ascending aorta: The ascending aorta was mildly dilated. - Left atrium: The atrium was mildly dilated. - Right ventricle: The cavity size was mildly dilated. Wall thickness was normal.  CTA 11/2013 - IMPRESSION: Ectasia of the thoracic aorta, measuring up to 3.9 cm in the ascending thoracic aorta, as above. No evidence of PE. Minimal atx.  ASSESSMENT AND PLAN:  1. Dyspnea on exertion with history of abnormal stress test and coronary calcifications on CT - Dr. Radford Pax recommended today's appointment to discuss cardiac catheterization and I agree this is appropriate. His chest pain is quite atypical but DOE warrants further investigation in light of risk factors. Risks and benefits of cardiac catheterization have been discussed with the patient.These include bleeding, infection, kidney damage, stroke, heart attack, death. The patient understands these risks and is willing to proceed. This was scheduled for tomorrow. Continue aspirin and statin. Not on BB due to h/o baseline sinus bradycardia. ER precautions reviewed. Will obtain baseline labs as well as CXR today. He is not tachycardic, tachypnic, hypoxic, and has no signs of DVT so PE is felt less likely. If cath unrevealing he is eager for reassurance to get back into regular activity to try  to increase endurance. 2. Essential HTN - controlled. 3. Dyslipidemia - continue statin. If CAD present on cath, would escalate statin. 4. Dilated aortic root - Dr. Theodosia Blender prior notes indicate she plans f/u echo 10/2014. 5. Dry cough - if no significant findings on cath and this persists, would consider changing ACEI to ARB. Will also check baseline CXR given interval development of cough and dyspnea. 6. Sinus bradycardia - chronic, asymptomatic.   Disposition: F/u with Dr. Radford Pax or APP 2 weeks post-cath.  Current medicines are reviewed at length with the patient today.  The patient did not have any concerns regarding medicines.  Signed, Lisbeth Renshaw  Blake Diffley PA-C 06/19/2014 4:58 PM     [address] [place] [address] [phone] (office)  [phone] (fax)

## 2014-06-20 NOTE — Interval H&P Note (Signed)
History and Physical Interval Note:  06/20/2014 9:51 AM  Blake Carter  has presented today for surgery, with the diagnosis of Worsening Exertional Dyspnea (Class III) with Abnormal Nuclear Stress Test.  The various methods of treatment have been discussed with the patient and family. After consideration of risks, benefits and other options for treatment, the patient has consented to  Procedure(s): LEFT HEART CATHETERIZATION WITH CORONARY ANGIOGRAM (N/A) +/- PCI as a surgical intervention .  The patient's history has been reviewed, patient examined, no change in status, stable for surgery.  I have reviewed the patient's chart and labs.  Questions were answered to the patient's satisfaction.    Cath Lab Visit (complete for each Cath Lab visit)  Clinical Evaluation Leading to the Procedure:   ACS: No.  Non-ACS:    Anginal Classification: CCS III  Anti-ischemic medical therapy: Minimal Therapy (1 class of medications)  Non-Invasive Test Results: High-risk stress test findings: cardiac mortality >3%/year  Prior CABG: No previous CABG  AUBC FOR CATH:  CAD Assessment (Coronary Angiography With or Without Left Heart Catheterization and/or Left Ventriculography)  Patient Information:   Suspected CAD (No Prior PCI, No Prior CABG, and No Prior Angiogram Showing > = 50% Angiographic Stenosis)   Prior Noninvasive Testing: Stress Test With Imaging (SPECT MPI, Stress Echocardiography, Stress PET, Stress CMR)   High-risk findings (e.g., 10% ischemic myocardium on stress SPECT MPI or stress PET, stress-induced wall motion abnormality in 2 or more segments on stress echo or stress CMR)   Pretest Symptom Status: Symptomatic  AUC Score:   A (9)   Indication:   17   AUC FOR PCI  Ischemic Symptoms? CCS III (Marked limitation of ordinary activity) Anti-ischemic Medical Therapy? Minimal Therapy (1 class of medications) Non-invasive Test Results? High-risk stress test findings: cardiac  mortality >3%/yr Prior CABG? No Previous CABG   Patient Information:   1-2V CAD, no prox LAD  A (8)  Indication: 18; Score: 8   Patient Information:   CTO of 1 vessel, no other CAD  A (7)  Indication: 28; Score: 7   Patient Information:   1V CAD with prox LAD  A (9)  Indication: 34; Score: 9   Patient Information:   2V-CAD with prox LAD  A (9)  Indication: 40; Score: 9   Patient Information:   3V-CAD without LMCA  A (9)  Indication: 46; Score: 9   Patient Information:   3V-CAD without LMCA With Abnormal LV systolic function  A (9)  Indication: 48; Score: 9   Patient Information:   LMCA-CAD  A (9)  Indication: 49; Score: 9   Patient Information:   2V-CAD with prox LAD PCI  A (7)  Indication: 62; Score: 7   Patient Information:   2V-CAD with prox LAD CABG  A (8)  Indication: 62; Score: 8   Patient Information:   3V-CAD without LMCA With Low CAD burden(i.e., 3 focal stenoses, low SYNTAX score) PCI  A (7)  Indication: 63; Score: 7   Patient Information:   3V-CAD without LMCA With Low CAD burden(i.e., 3 focal stenoses, low SYNTAX score) CABG  A (9)  Indication: 63; Score: 9   Patient Information:   3V-CAD without LMCA E06c - Intermediate-high CAD burden (i.e., multiple diffuse lesions, presence of CTO, or high SYNTAX score) PCI  U (4)  Indication: 64; Score: 4   Patient Information:   3V-CAD without LMCA E06c - Intermediate-high CAD burden (i.e., multiple diffuse lesions, presence of CTO, or high SYNTAX score)  CABG  A (9)  Indication: 64; Score: 9   Patient Information:   LMCA-CAD With Isolated LMCA stenosis  PCI  U (6)  Indication: 65; Score: 6   Patient Information:   LMCA-CAD With Isolated LMCA stenosis  CABG  A (9)  Indication: 65; Score: 9   Patient Information:   LMCA-CAD Additional CAD, low CAD burden (i.e., 1- to 2-vessel additional involvement, low SYNTAX score) PCI  U (5)   Indication: 66; Score: 5   Patient Information:   LMCA-CAD Additional CAD, low CAD burden (i.e., 1- to 2-vessel additional involvement, low SYNTAX score) CABG  A (9)  Indication: 66; Score: 9   Patient Information:   LMCA-CAD Additional CAD, intermediate-high CAD burden (i.e., 3-vessel involvement, presence of CTO, or high SYNTAX score) PCI  I (3)  Indication: 67; Score: 3   Patient Information:   LMCA-CAD Additional CAD, intermediate-high CAD burden (i.e., 3-vessel involvement, presence of CTO, or high SYNTAX score) CABG  A (9)  Indication: 67; Score: 9    Blake Carter

## 2014-06-20 NOTE — CV Procedure (Signed)
CARDIAC CATHETERIZATION AND PERCUTANEOUS CORONARY INTERVENTION REPORT  NAME:  Blake Carter   MRN: [id number] DOB:  April 23, 1951   ADMIT DATE: 06/20/2014 Procedure Date: 06/20/2014  INTERVENTIONAL CARDIOLOGIST: Leonie Man, M.D., MS PRIMARY CARE PROVIDER: Gennette Pac, MD PRIMARY CARDIOLOGIST: Fransico Him, MD  PATIENT:  Blake Carter is a 64 y.o. male with a history of hypertension, dyslipidemia, OCD and dilated aortic root with a significant family history of CAD. He has been followed by Dr. Fransico Him for the ectatic ascending aorta. As part of evaluation he had CT scan that showed coronary calcification. Then a Myoview that showed anterior and inferior ischemia. He was doing well being treated medically in the last few weeks. He is noted increasing exertional dyspnea, with some discomfort in the shoulders and chest. He contacted Dr. Theodosia Blender office, she was concerned that this is likely an anginal equivalent. The level of dyspnea on exertion would be consistent with class III symptoms. He was seen yesterday by Melina Copa, PA-C in preparation for cardiac catheterization plus minus PCI today for worsening symptoms.  PRE-OPERATIVE DIAGNOSIS:    Class III Exertional Dyspnea/Angina  High Risk/AbnormaL Nuclear Stress Test  PROCEDURES PERFORMED:    Left Heart Catheterization with Native Coronary Angiography  via Right Radial Artery   Left Ventriculography  PROCEDURE: The patient was brought to the [facility] in the fasting state and prepped and draped in the usual sterile fashion for right radial artery access. A modified Allen's test was performed on the right wrist demonstrating excellent collateral flow for radial access.   Sterile technique was used including antiseptics, cap, gloves, gown, hand hygiene, mask and sheet. Skin prep: Chlorhexidine.   Consent: Risks of procedure as well as the alternatives and risks of each were explained  to the (patient/caregiver). Consent for procedure obtained.   Time Out: Verified patient identification, verified procedure, site/side was marked, verified correct patient position, special equipment/implants available, medications/allergies/relevent history reviewed, required imaging and test results available. Performed.  Access:   Right Radial Artery: 6 Fr Sheath -  Seldinger Technique    Radial Cocktail - 10 mL; IV Heparin 6000 Units   Left Heart Catheterization:  5 Fr Catheters advanced or exchanged over a long exchange safety J-wire; TIG 4.0 catheter advanced first.  Left and Right Coronary Artery Cineangiography: TIG 4.0 Catheter   LV Hemodynamics (LV Gram): Angled pigtail  Sheath removed in the cardiac catheterization lab with TR band placement for hemostasis.  TR Band: 1210  Hours; 14 mL air  FINDINGS:  Hemodynamics:   Central Aortic Pressure / Mean: 119/69/90 mmHg  Left Ventricular Pressure / LVEDP: 109/3/16 mmHg  Left Ventriculography:  EF: 60-65 %  Wall Motion: Normal  Coronary Anatomy:  Dominance: Right  Left Main: Normal caliber vessel that bifurcates into the LAD and Circumflex. Angiographically normal. LAD: Normal caliber vessel it gives rise to a proximal D1. At the takeoff of D2 there is diffuse 40% proximal and possibly 60-70% distal lesion. The vessel then normalizes to have mild luminal irregularities distally as it courses down around the apex.  D1: Moderate caliber vessel. Angiographically normal.  D2: Small-caliber vessel with ostial/proximal 40% stenosis. Left Circumflex: Large-caliber, almost codominant vessel. It gives rise to a very proximal OM1/Ramus Intermedius and then bifurcates distally into a large lateral OM2 and smaller distal AV groove circumflex that has a focal 90% stenosis.  OM1: Moderate to large caliber vessel. Angiographically normal.  OM 2/LPL: Large-caliber vessel, tortuous with free of disease.  RCA: Large-caliber vessel  with focal 20-30% mid vessel disease. The vessel terminates as a right posterior descending artery with a very small AV nodal artery.  The PDA reaches roughly 2/3 the way to the apex. No significant disease.  After reviewing the initial angiography, the culprit lesion was thought to be the proximal AV groove circumflex 90% lesion, however with Myoview suggestion of anterior ischemia, the moderate lesion in the mid LAD wanted evaluation.Marland Kitchen  Preparation were made to proceed with FFR guided PCI on the LAD followed by PCI of the AV groove circumflex.  Fractional Flow Reserve (FFR) measurement guided Percutaneous Coronary Intervention:     Lesion #2: Mid LAD 60-70% lesion at D2 takeoff  Guide: 6 Fr   XB LAD 3.5  Guidewire: BMW --the BMW wire was advanced beyond the lesion. The plan was to use the ASSIST FFR catheter, however this did not function correctly. After several attempts it was not reading correctly therefore the catheter was exchanged and the [facility] PrimeWire was advanced to the distal LAD. FFR: With the PrimeWire normalized in the proximal LAD and advanced to the distal LAD, IV adenosine was infused for 2 minutes. Resting FFR was 0.93, peak FFR was 0.80. Given the patient's extensive symptoms and prior evidence of anterior ischemia on a stress test, this is thought to be physiologically significant. At this point plans were to proceed with PCI of the LAD followed by PCI on the circumflex.  Predilation Balloon: Euphora 2.5 mm x 12 mm;   8 Atm x 25 Sec, at the bifurcation of LAD and D2; D2 remain patent on post inflation angiography Stent: Synergy DES 3.0 mm x 24 mm;   16 Atm x 30 Sec Post-dilation Balloon: [place] Euphora 3.5 mm x 15 mm;   16 Atm x 30 Sec distal half of the stent, 18 Atm x 30 Sec proximal half of stent  Final Diameter: 3.6 mm proximal tapered to 3.56 mm distal  Post deployment angiography in multiple views, with and without guidewire in place revealed excellent stent  deployment and lesion coverage.  There was no evidence of dissection or perforation.    Attention was then turned to the AV groove circumflex lesion; Lesion #2  Percutaneous Coronary Intervention:   Lesion #2: Proximal AV groove circumflex 90% stenosis with TIMI 3 flow reduced to 0% stenosis and TIMI-3 flow Guide: 6 Fr   XB LAD 3.5 Guidewire: Luge  Predilation Balloon: Emerge 2.0 mm x 12 mm;  10 Atm x 14 Sec,  Stent: Synergy DES 2.25 mm x 12 mm;   14 Atm x 30 Sec, final distal diameter 2.3 mm Post-dilation Balloon:  Emerge 2.5 mm x 8 mm;   16 Atm x 30 Sec; Final Diameter proximal stent: 2.5 mm   Post deployment angiography in multiple views, with and without guidewire in place revealed excellent stent deployment and lesion coverage.  There was no evidence of dissection or perforation.  MEDICATIONS:  Anesthesia:  Local Lidocaine 2 ml  Sedation:  4 mg IV Versed, 175 mcg IV fentanyl ; Dilaudid 2 mg  Omnipaque Contrast: 275 ml  Anticoagulation:  IV Heparin 18,000 Units  Anti-Platelet Agent:  Brilinta 180 mg load   PATIENT DISPOSITION:    The patient was transferred to the PACU holding area in a hemodynamicaly stable, chest pain free condition.  The patient tolerated the procedure well, and there were no complications.  EBL:   < 20 ml  The patient was stable before, during, and after the  procedure. He was complaining of back/shoulder and chest pain for a large portion of the procedure. This was despite having TIMI-3 flow in all coronaries and hemodynamic stability.  POST-OPERATIVE DIAGNOSIS:    Severe 2 vessel CAD - with FFR + mid LAD ~60-70% lesion (FFR 0.80) and distal Circumflex 90 %  Successful FFR guided PCI of mid LAD across diagonal branch with Synergy 3.0 mm x 24 mm (3.5 mm)  Successful PCI of distal Circumflex after Lateral OM with Synergy 2.25 mm x 12 mm (2.4 -2.5 mm)  PLAN OF CARE:  Overnight evaluation in the postprocedure unit.  Pain control for  musculoskeletal pain  Dual antiplatelet therapy for minimum one year  Continue aggressive risk factor modification and cardiac medication adjustments.  Follow-up with Dr. Radford Pax  Anticipate discharge the morning    Akaya Proffit, Leonie Green, M.D., M.S. Interventional Cardiologist   Pager # [phone]

## 2014-06-20 NOTE — Progress Notes (Signed)
TR BAND REMOVAL  LOCATION:    right radial  DEFLATED PER PROTOCOL:    Yes.    TIME BAND OFF / DRESSING APPLIED:    1715   SITE UPON ARRIVAL:    Level 0  SITE AFTER BAND REMOVAL:    Level 0  CIRCULATION SENSATION AND MOVEMENT:    Within Normal Limits   Yes.    COMMENTS:   Rechecked at 1745 with no change in assessment

## 2014-06-20 NOTE — Brief Op Note (Signed)
   BRIEF CARDIAC CATH / PCI NOTE  06/20/2014  12:15 PM  PATIENT:  Blake Carter  64 y.o. male with HTN, HLD, obesity who has been treated medically for presumed CAD (High Risk Myoview in 12/2013).  He was doing well until the past few weeks when he has noted progressively worsening (CLASS III) exertional dyspnea.  Given his recent Myoview, he was referred for invasive cardiac evaluation.  PRE-OPERATIVE DIAGNOSIS:  Class III Exertional Dyspnea; High Risk Myoview  POST-OPERATIVE DIAGNOSIS:   Severe 2 vessel disease with 90% distal circumflex after large lateral OM 2, 60-70% mid LAD after D2 with FFR of 0.8 (correlates with stress test distribution)  Successful FFR guided PCI of mid LAD with a Synergy DES Stent 3.0 mm x 24 mm (3.5 mm)  Successful PCI of the distal circumflex after lateral OM 2 with a Synergy DES 2.25 mm x 12 mm (2.5 mm)  Mild disease in the RCA and the remainder of the circumflex system.  Normal EF  Hemodynamics: LV EF ~60-65%  LVP/EDP: 109/3/16 mmHg  Aortic Pressure/ MAP: 119/69/90  PROCEDURE:  Procedure(s):  LEFT HEART CATHETERIZATION WITH CORONARY ANGIOGRAM (N/A) & Left Ventriculography  R Radial 6 Fr Sheath (Seldinger) --> TIG 4.0 with Long Ex Safety J wire; L&R CA Angiography; Angled Pigtail - LV Hemodynamics & LV Gram  TR Band: 14 mL air, 1210 hrs  Fractional Flow Reserve (FFR) measurement of mid LAD using [place] PrimeWire - 0.8;   6 Fr XBLAD Guide; BMW/ [place] wire across lesion  Adenosine 140 mcg/kg/min x 2 min == FFR 0.8 (Significant)    PCI of mid LAD: Synergy DES Stent 3.0 mm x 24 mm (3.5 mm)  6 Fr XBLAD Guide; BMW wire  PCI of distal Cx: Synergy DES Stent 2.25 mm x 82mm (2.5 mm)  6 Fr XBLAD Guide; BMW wire  SURGEON:  Surgeon(s) and Role:    * Leonie Man, MD - Primary  ANESTHESIA:   local and IV sedation; 2 ml Lidocaine; 4 mg IV Versed, 175 mcg Fentanyl, 2 mg Dilaudid  EBL:    < 20  BLOOD ADMINISTERED:none  MEDICATIONS  USED:    Heparin: Total of 18,000 Units Radial Cocktail: 5 mg Verapamil, 400 mcg NTG, 2 ml 2% Lidocaine in 10 ml NS Brilinta 180 mg Intracoronary Nitroglycerin 200 g 2 Omnipaque contrast: 275 mL  PLAN OF CARE: Admit for overnight observation  PATIENT DISPOSITION:  PACU - hemodynamically stable. No complications.   Delay start of Pharmacological VTE agent (>24hrs) due to surgical blood loss or risk of bleeding: not applicable    Zephyr Ridley, Leonie Green, M.D., M.S. Interventional Cardiologist   Pager # [phone]

## 2014-06-21 ENCOUNTER — Encounter (HOSPITAL_COMMUNITY): Payer: Self-pay | Admitting: Nurse Practitioner

## 2014-06-21 DIAGNOSIS — R931 Abnormal findings on diagnostic imaging of heart and coronary circulation: Secondary | ICD-10-CM

## 2014-06-21 DIAGNOSIS — I2510 Atherosclerotic heart disease of native coronary artery without angina pectoris: Secondary | ICD-10-CM | POA: Diagnosis not present

## 2014-06-21 DIAGNOSIS — R0609 Other forms of dyspnea: Secondary | ICD-10-CM

## 2014-06-21 DIAGNOSIS — I10 Essential (primary) hypertension: Secondary | ICD-10-CM | POA: Diagnosis not present

## 2014-06-21 DIAGNOSIS — Z9861 Coronary angioplasty status: Secondary | ICD-10-CM

## 2014-06-21 DIAGNOSIS — I2584 Coronary atherosclerosis due to calcified coronary lesion: Secondary | ICD-10-CM | POA: Diagnosis not present

## 2014-06-21 LAB — CBC
HCT: 37.5 % — ABNORMAL LOW (ref 39.0–52.0)
Hemoglobin: 12.4 g/dL — ABNORMAL LOW (ref 13.0–17.0)
MCH: 29.6 pg (ref 26.0–34.0)
MCHC: 33.1 g/dL (ref 30.0–36.0)
MCV: 89.5 fL (ref 78.0–100.0)
Platelets: 238 10*3/uL (ref 150–400)
RBC: 4.19 MIL/uL — ABNORMAL LOW (ref 4.22–5.81)
RDW: 13.8 % (ref 11.5–15.5)
WBC: 6.1 10*3/uL (ref 4.0–10.5)

## 2014-06-21 LAB — BASIC METABOLIC PANEL
Anion gap: 9 (ref 5–15)
BUN: 10 mg/dL (ref 6–23)
CO2: 27 mmol/L (ref 19–32)
Calcium: 8.8 mg/dL (ref 8.4–10.5)
Chloride: 99 mEq/L (ref 96–112)
Creatinine, Ser: 1.12 mg/dL (ref 0.50–1.35)
GFR calc Af Amer: 80 mL/min — ABNORMAL LOW (ref >90)
GFR calc non Af Amer: 69 mL/min — ABNORMAL LOW (ref >90)
Glucose, Bld: 104 mg/dL — ABNORMAL HIGH (ref 70–99)
Potassium: 4 mmol/L (ref 3.5–5.1)
Sodium: 135 mmol/L (ref 135–145)

## 2014-06-21 MED ORDER — PANTOPRAZOLE SODIUM 40 MG PO TBEC: 40.0000 mg | DELAYED_RELEASE_TABLET | Freq: Every day | ORAL | Status: DC

## 2014-06-21 MED ORDER — NITROGLYCERIN 0.4 MG SL SUBL: 0.4000 mg | SUBLINGUAL_TABLET | SUBLINGUAL | Status: DC | PRN

## 2014-06-21 MED ORDER — NITROGLYCERIN 0.4 MG SL SUBL
SUBLINGUAL_TABLET | SUBLINGUAL | Status: AC
  Filled 2014-06-21: qty 1

## 2014-06-21 MED ORDER — CLOPIDOGREL BISULFATE 75 MG PO TABS: 75.0000 mg | ORAL_TABLET | Freq: Every day | ORAL | Status: DC

## 2014-06-21 NOTE — Discharge Summary (Signed)
Discharge Summary   Patient ID: Blake Carter,  MRN: [id number], DOB/AGE: 63-04-1951 63 y.o.  Admit date: 06/20/2014 Discharge date: 06/21/2014  Primary Care Provider: Gennette Pac Primary Cardiologist: T. Turner, MD   Discharge Diagnoses Principal Problem:   Dyspnea on exertion  **S/P cardiac catheterization and successful drug-eluting stent placement with in the left circumflex and LAD this admission.  Active Problems:   Abnormal nuclear stress test   Essential hypertension   CAD S/P percutaneous coronary angioplasty   Sinus bradycardia   Hyperlipidemia  Allergies Allergies  Allergen Reactions  . Niacin And Related Other (See Comments)    Flushing   . Tagamet [Cimetidine] Other (See Comments)    unknown   Procedures  Cardiac Catheterization and Percutaneous Coronary Intervention 1.21.2016  Hemodynamics:    Central Aortic Pressure / Mean: 119/69/90 mmHg  Left Ventricular Pressure / LVEDP: 109/3/16 mmHg  Left Ventriculography:  EF: 60-65 %  Wall Motion: Normal  Coronary Anatomy:  Dominance: Right  Left Main: Normal caliber vessel that bifurcates into the LAD and Circumflex. Angiographically normal. LAD: Normal caliber vessel it gives rise to a proximal D1. At the takeoff of D2 there is diffuse 40% proximal and possibly 60-70% distal lesion. The vessel then normalizes to have mild luminal irregularities distally as it courses down around the apex.    **Fractional flow reserve was measured within the mid LAD and was found to be 0.80. This area was successfully stented using a 3.0 x 24 mm Synergy drug-eluting stent.   D1: Moderate caliber vessel. Angiographically normal.  D2: Small-caliber vessel with ostial/proximal 40% stenosis. Left Circumflex: Large-caliber, almost codominant vessel. It gives rise to a very proximal OM1/Ramus Intermedius and then bifurcates distally into a large lateral OM2 and smaller distal AV groove circumflex that has a focal  90% stenosis.   **The distal left circumflex was successfully stented using a 2.25 x 12 mm Synergy drug-eluting stent.    OM1: Moderate to large caliber vessel. Angiographically normal.  OM 2/LPL: Large-caliber vessel, tortuous with free of disease.     RCA: Large-caliber vessel with focal 20-30% mid vessel disease. The vessel terminates as a right posterior descending artery with a very small AV nodal artery.  The PDA reaches roughly 2/3 the way to the apex. No significant disease. _____________   History of Present Illness  64 year old male with a history of hypertension, hyperlipidemia, dilated aortic root, and a family history of coronary artery disease. In June 2015, CT of his chest was performed to assess his ascending aorta and incidentally noted calcifications of his coronary arteries. Stress testing was subsequently undertaken and revealed a medium-size, moderate severity defect in the LAD territory and a small cell size, moderate severity defect in the RCA territory. EF was 55% with normal wall motion. Patient was asymptomatic and was thus managed medically. Recently, he developed dyspnea on exertion without chest pain and was seen in clinic on January 20. Decision was made to pursue diagnostic catheterization given new onset of symptoms with prior objective evidence of ischemia.   Hospital Course  Patient presented to [facility] cardiac catheterization laboratory on January 21 and underwent diagnostic cardiac catheterization revealing severe distal left circumflex disease along with moderate mid LAD disease. Fractional flow reserve was performed within the mid LAD and was slightly abnormal at 0.80. Given symptoms and prior anterior defect on Myoview, decision was made to pursue PCI in this area was successfully stented using a 3.0 x 24 mm Synergy drug-eluting stent. The distal left circumflex  was then approached and successfully treated using a 2.25 x 12 mm Synergy drug-eluting stent.  Postprocedure, patient has been ambulating without recurrent dyspnea on exertion and he will be discharged home today in good condition.  Discharge Vitals Blood pressure 113/58, pulse 53, temperature 97.6 F (36.4 C), temperature source Oral, resp. rate 20, height 5\' 11"  (1.803 m), weight 260 lb (117.935 kg), SpO2 98 %.  Filed Weights   06/20/14 0835  Weight: 260 lb (117.935 kg)   Labs  CBC  Recent Labs  06/20/14 0841 06/21/14 0331  WBC 5.7 6.1  HGB 13.5 12.4*  HCT 40.0 37.5*  MCV 88.1 89.5  PLT 251 177   Basic Metabolic Panel  Recent Labs  06/20/14 0841 06/21/14 0331  NA 141 135  K 4.4 4.0  CL 106 99  CO2 25 27  GLUCOSE 95 104*  BUN 19 10  CREATININE 0.99 1.12  CALCIUM 9.6 8.8   Disposition  Pt is being discharged home today in good condition.  Follow-up Plans & Appointments  Follow-up Information    Follow up with Melina Copa, PA-C On 07/04/2014.   Specialty:  Cardiology   Why:  10:15 AM   Contact information:   [address] [place] [address] [phone]       Follow up with Gennette Pac, MD.   Specialty:  Family Medicine   Why:  as scheduled   Contact information:   [place] [address] [phone]      Discharge Medications    Medication List    STOP taking these medications        esomeprazole 40 MG capsule  Commonly known as:  Martell these medications        amLODipine 2.5 MG tablet  Commonly known as:  NORVASC  Take 1 tablet (2.5 mg total) by mouth daily.     amLODipine 5 MG tablet  Commonly known as:  NORVASC  Take 1 tablet (5 mg total) by mouth every morning.     aspirin EC 81 MG tablet  Take 81 mg by mouth daily.     atorvastatin 40 MG tablet  Commonly known as:  LIPITOR  Take 1 tablet (40 mg total) by mouth every morning.     clopidogrel 75 MG tablet  Commonly known as:  PLAVIX  Take 1 tablet (75 mg total) by mouth daily with breakfast.     diazepam 5  MG tablet  Commonly known as:  VALIUM  Take 5 mg by mouth every 6 (six) hours as needed for anxiety.     fluvoxaMINE 50 MG tablet  Commonly known as:  LUVOX  Take 150 mg by mouth every morning.     gemfibrozil 600 MG tablet  Commonly known as:  LOPID  Take 600 mg by mouth 2 (two) times daily.     HYDROcodone-acetaminophen 5-325 MG per tablet  Commonly known as:  NORCO/VICODIN  Take 1-2 tablets by mouth every 6 (six) hours as needed.     levothyroxine 150 MCG tablet  Commonly known as:  SYNTHROID, LEVOTHROID  Take 1 tablet (150 mcg total) by mouth daily before breakfast.     lisinopril 20 MG tablet  Commonly known as:  PRINIVIL,ZESTRIL  Take 20 mg by mouth every morning.     nitroGLYCERIN 0.4 MG SL tablet  Commonly known as:  NITROSTAT  Place 1 tablet (0.4 mg total) under the tongue every 5 (five) minutes as  needed for chest pain.     pantoprazole 40 MG tablet  Commonly known as:  PROTONIX  Take 1 tablet (40 mg total) by mouth daily.     zolpidem 10 MG tablet  Commonly known as:  AMBIEN  Take 10 mg by mouth at bedtime as needed for sleep.       Outstanding Labs/Studies  None  Duration of Discharge Encounter   Greater than 30 minutes including physician time.  Signed, Murray Hodgkins NP 06/21/2014, 9:49 AM

## 2014-06-21 NOTE — Progress Notes (Signed)
Patient Name: Blake Carter Date of Encounter: 06/21/2014  Principal Problem:   Dyspnea on exertion Active Problems:   Abnormal nuclear stress test   Essential hypertension   CAD S/P percutaneous coronary angioplasty   Sinus bradycardia   Hyperlipidemia    SUBJECTIVE  No recurrent dyspnea.  Had a little bit of left chest soreness while ambulating this AM.  CURRENT MEDS . adenosine (diagnostic)  20 mL Intravenous Once  . amLODipine  2.5 mg Oral Daily  . amLODipine  5 mg Oral q morning - 10a  . aspirin  81 mg Oral Daily  . atorvastatin  40 mg Oral q morning - 10a  . clopidogrel  75 mg Oral Q breakfast  . fluvoxaMINE  150 mg Oral q morning - 10a  . gemfibrozil  600 mg Oral BID  . levothyroxine  150 mcg Oral QAC breakfast  . lisinopril  20 mg Oral q morning - 10a  . pantoprazole  40 mg Oral Daily    OBJECTIVE  Filed Vitals:   06/20/14 2000 06/20/14 2335 06/21/14 0405 06/21/14 0410  BP: 133/69 124/51 140/60   Pulse: 51 52 59 55  Temp: 97.7 F (36.5 C) 98.3 F (36.8 C) 98.1 F (36.7 C)   TempSrc: Oral Oral Oral   Resp: 20 20 20    Height:      Weight:      SpO2: 100% 97% 97% 97%    Intake/Output Summary (Last 24 hours) at 06/21/14 0815 Last data filed at 06/21/14 0400  Gross per 24 hour  Intake 1381.6 ml  Output   3725 ml  Net -2343.4 ml   Filed Weights   06/20/14 0835  Weight: 260 lb (117.935 kg)    PHYSICAL EXAM  General: Pleasant, NAD. Neuro: Alert and oriented X 3. Moves all extremities spontaneously. Psych: Normal affect. HEENT:  Normal  Neck: Supple without bruits or JVD. Lungs:  Resp regular and unlabored, CTA. Heart: RRR no s3, s4, or murmurs. Abdomen: Soft, non-tender, non-distended, BS + x 4.  Extremities: No clubbing, cyanosis or edema. DP/PT/Radials 2+ and equal bilaterally. R wrist cath site w/o bleeding/bruit/hematoma.  Accessory Clinical Findings  CBC  Recent Labs  06/20/14 0841 06/21/14 0331  WBC 5.7 6.1  HGB 13.5 12.4*    HCT 40.0 37.5*  MCV 88.1 89.5  PLT 251 878   Basic Metabolic Panel  Recent Labs  06/20/14 0841 06/21/14 0331  NA 141 135  K 4.4 4.0  CL 106 99  CO2 25 27  GLUCOSE 95 104*  BUN 19 10  CREATININE 0.99 1.12  CALCIUM 9.6 8.8   TELE  Sinus brady -  into the high 40's during sleep.  ECG  Sb, 47, no acute st/t changes.  Radiology/Studies  Dg Chest 2 View  06/19/2014   CLINICAL DATA:  Preop heart cath.  Hypertension.  EXAM: CHEST  2 VIEW  COMPARISON:  10/05/2012  FINDINGS: Elevated right hemidiaphragm. Heart and mediastinal contours are within normal limits. No focal opacities or effusions. No acute bony abnormality.  IMPRESSION: No active cardiopulmonary disease.   Electronically Signed   By: Rolm Baptise M.D.   On: 06/19/2014 16:49   ASSESSMENT AND PLAN  1.  Dyspnea on exertion/CAD:  S/p cath yesterday revealing severe LCX dzs and moderate LAD dzs with borderline FFR of 0.80 but with previous anterior ischemia on MV thus both areas treated with synergy DES'.  No further DOE.  Some chest discomfort with ambulation this AM, which was new for  him.  This resolved while walking.  ECG unchanged this morning.  Cont asa, plavix, statin.  No bb 2/2 baseline bradycardia.  Prob d/c today after further ambulation.  2.  HTN:  Stable on ccb/acei.  3.  HL/HTG:  On statin/lopid.  4.  Sinus Bradycardia:  Stable/asymptomatic.  Signed, Murray Hodgkins NP

## 2014-06-21 NOTE — Discharge Instructions (Signed)

## 2014-06-21 NOTE — Progress Notes (Signed)
CARDIAC REHAB PHASE I   PRE:  Rate/Rhythm: 48 SB  BP:  Supine:   Sitting: 123/73  Standing:    SaO2:   MODE:  Ambulation: 1000 ft   POST:  Rate/Rhythm: 99 SR  BP:  Supine:   Sitting: 124/80  Standing:    SaO2: 98 RA 0745-0900 Pt tolerated ambulation well, he did c/o of slight discomfort in left upper chest at end of walk. It disappeared quickly with rest. Pt to side of bed after walk with call light in reach. Completed stent discharge education with pt. He voices understanding. Pt agrees to [facility]. CRP in [place], will send referral.  Rodney Langton RN 06/21/2014 9:06 AM

## 2014-06-21 NOTE — Care Management Note (Addendum)
  Page 1 of 1   06/21/2014     11:02:13 AM CARE MANAGEMENT NOTE 06/21/2014  Patient:  VESTER, BALTHAZOR   Account Number:  [id number]  Date Initiated:  06/21/2014  Documentation initiated by:    Subjective/Objective Assessment:   dyspnea on exertion  Left Heart Catheterization with Native Coronary Angiography  via Right Radial Artery    Left Ventriculography     Action/Plan:   CM to follow for disposition needs   Anticipated DC Date:  06/21/2014   Anticipated DC Plan:  HOME/SELF CARE         Choice offered to / List presented to:             Status of service:  Completed, signed off Medicare Important Message given?  NO (If response is "NO", the following Medicare IM given date fields will be blank) Date Medicare IM given:   Medicare IM given by:   Date Additional Medicare IM given:   Additional Medicare IM given by:    Discharge Disposition:  HOME/SELF CARE  Per UR Regulation:  Reviewed for med. necessity/level of care/duration of stay  If discussed at [place] of Stay Meetings, dates discussed:    Comments:  Mairyn Lenahan RN, BSN, MSHL, CCM  Nurse - Case Manager,  (Unit [phone]  06/21/2014 Specialty Med review:  Plavix

## 2014-06-27 ENCOUNTER — Telehealth: Payer: Self-pay | Admitting: Cardiology

## 2014-06-27 NOTE — Telephone Encounter (Addendum)
Patient c/o pain in his chest "where his pecs would be" at 7-8/10. Patient st he has experienced this pain intermittently since his catheterization 1/21, but only when walking. Today, it started while walking and never subsided.  Patient st he took one nitro with no relief. Instructed patient about Nitro use (1 pill every 5 minutes x 3 doses). Patient has no other complaints and no other symptoms.   Spoke to Dr. Tamala Julian, who recommends the patient go to the ER to be checked, especially if pain not subsiding with nitro.   Spoke to patient again, who does report relief after second nitro.  Instructed patient to report to the ER if pain comes back and continues, or if more symptoms develop. Patient agrees with treatment plan.

## 2014-06-27 NOTE — Telephone Encounter (Signed)
New message     Pt c/o of Chest Pain: STAT if CP now or developed within 24 hours  1. Are you having CP right now?no  2. Are you experiencing any other symptoms (ex. SOB, nausea, vomiting, sweating)? no  3. How long have you been experiencing CP? Had stent put in last thurs 4. Is your CP continuous or coming and going? continous  5. Have you taken Nitroglycerin? no ?pt is experiencing chest pain while walking.  He was instructed to walk after stents.  When not walking, no chest pain.  Please advise

## 2014-06-27 NOTE — Telephone Encounter (Signed)
Correct

## 2014-06-30 NOTE — Telephone Encounter (Signed)
Please work him in with extender on Monday 2/2

## 2014-07-01 NOTE — Telephone Encounter (Signed)
Spoke with patient and offered earlier appointment today, but he wishes to keep appointment 2/4 with Melina Copa. Instructed patient to go to the ER if he becomes symptomatic.  Patient agrees with treatment plan and is thankful for call.

## 2014-07-04 ENCOUNTER — Encounter: Payer: Self-pay | Admitting: Physician Assistant

## 2014-07-04 ENCOUNTER — Ambulatory Visit (INDEPENDENT_AMBULATORY_CARE_PROVIDER_SITE_OTHER): Payer: BLUE CROSS/BLUE SHIELD | Admitting: Physician Assistant

## 2014-07-04 ENCOUNTER — Encounter (HOSPITAL_COMMUNITY)
Admission: RE | Admit: 2014-07-04 | Discharge: 2014-07-04 | Disposition: A | Discharge status: Home / Self Care | Payer: BLUE CROSS/BLUE SHIELD | Source: Ambulatory Visit | Attending: Cardiology | Admitting: Cardiology

## 2014-07-04 VITALS — BP 122/68 | HR 58 | Ht 71.0 in | Wt 262.0 lb

## 2014-07-04 DIAGNOSIS — Z9861 Coronary angioplasty status: Secondary | ICD-10-CM

## 2014-07-04 DIAGNOSIS — R001 Bradycardia, unspecified: Secondary | ICD-10-CM

## 2014-07-04 DIAGNOSIS — I10 Essential (primary) hypertension: Secondary | ICD-10-CM

## 2014-07-04 DIAGNOSIS — F329 Major depressive disorder, single episode, unspecified: Secondary | ICD-10-CM | POA: Insufficient documentation

## 2014-07-04 DIAGNOSIS — Z8249 Family history of ischemic heart disease and other diseases of the circulatory system: Secondary | ICD-10-CM | POA: Insufficient documentation

## 2014-07-04 DIAGNOSIS — R079 Chest pain, unspecified: Secondary | ICD-10-CM

## 2014-07-04 DIAGNOSIS — E785 Hyperlipidemia, unspecified: Secondary | ICD-10-CM

## 2014-07-04 DIAGNOSIS — Z7982 Long term (current) use of aspirin: Secondary | ICD-10-CM | POA: Insufficient documentation

## 2014-07-04 DIAGNOSIS — I2510 Atherosclerotic heart disease of native coronary artery without angina pectoris: Secondary | ICD-10-CM

## 2014-07-04 DIAGNOSIS — Z5189 Encounter for other specified aftercare: Secondary | ICD-10-CM | POA: Insufficient documentation

## 2014-07-04 DIAGNOSIS — Z8546 Personal history of malignant neoplasm of prostate: Secondary | ICD-10-CM | POA: Insufficient documentation

## 2014-07-04 DIAGNOSIS — I2584 Coronary atherosclerosis due to calcified coronary lesion: Secondary | ICD-10-CM | POA: Insufficient documentation

## 2014-07-04 DIAGNOSIS — K219 Gastro-esophageal reflux disease without esophagitis: Secondary | ICD-10-CM | POA: Insufficient documentation

## 2014-07-04 DIAGNOSIS — Z955 Presence of coronary angioplasty implant and graft: Secondary | ICD-10-CM | POA: Insufficient documentation

## 2014-07-04 DIAGNOSIS — Z79899 Other long term (current) drug therapy: Secondary | ICD-10-CM | POA: Insufficient documentation

## 2014-07-04 DIAGNOSIS — I77810 Thoracic aortic ectasia: Secondary | ICD-10-CM

## 2014-07-04 DIAGNOSIS — E890 Postprocedural hypothyroidism: Secondary | ICD-10-CM | POA: Insufficient documentation

## 2014-07-04 DIAGNOSIS — I209 Angina pectoris, unspecified: Secondary | ICD-10-CM | POA: Insufficient documentation

## 2014-07-04 MED ORDER — AMLODIPINE BESYLATE 2.5 MG PO TABS: 7.5000 mg | ORAL_TABLET | Freq: Every morning | ORAL | Status: DC

## 2014-07-04 MED ORDER — PANTOPRAZOLE SODIUM 40 MG PO TBEC: 80.0000 mg | DELAYED_RELEASE_TABLET | Freq: Every day | ORAL | Status: DC

## 2014-07-04 NOTE — Patient Instructions (Signed)
Your physician recommends that you continue on your current medications as directed. Please refer to the Current Medication list given to you today.  An Rx for Amlodipine 2.5mg  tablets( Take 3 tablets) daily, has been sent to your mail order pharmacy  Your physician has requested that you have an echocardiogram. Echocardiography is a painless test that uses sound waves to create images of your heart. It provides your doctor with information about the size and shape of your heart and how well your heart's chambers and valves are working. This procedure takes approximately one hour. There are no restrictions for this procedure.( To be scheduled in June 2016)  Your physician recommends that you schedule a follow-up appointment in: 2 months with Dr.Turner

## 2014-07-04 NOTE — Progress Notes (Signed)
Cardiology Office Note  Date:  07/04/2014   Patient ID:  Blake, Carter 03/28/1951, MRN [id number]  PCP:  Blake Pac, MD  Cardiologist: Dr. Radford Pax   Chief Complaint: chest pain, post-hospital follow-up from stenting  History of Present Illness: Blake Carter is a 64 y.o. male with history of with history of recently diagnosed CAD, prior history of HTN, dyslipidemia, OCD, dilated aortic root, hypothyroidism (h/o RAI), sinus bradycardia who presents for post-hospital followup.  H/o 2D echo 10/2013: EF 55-60%, grade 1 DD, aortic sclerosis, ascending aortic diameter 85mm, aortic root dimension 43mm, mildly dilated LA. CT angio for additional measurement showed ectasia up to 3.9cm of the ascending thoracic aorta (Dr. Radford Pax plans to repeat echo in 10/2014). However, chest CT at that time also showed coronary artery calcifications so a nuclear stress test was done which showed a high risk stress nuclear study with two reversible perfusion defects: a medium size moderate severity defect in the LAD territory and a small size moderate severity defect in the RCA territory. The inferior defect was felt to be a result of significant extracardiac activity. LV Ejection Fraction: 55% with normal wall motion. He was initially completely asymptomatic and was managed medically, surveilling for further symptoms. He presented back to the office last month complaining of worsening DOE and atypical-type chest discomfort. Although his chest discomfort was not clearly ischemic, his dyspnea was more concerning thus cardiac cath was arranged. He went on to have successful DES placement to the mLAD (significant by FFR) and dLCx, otherwise residual nonobstructive disease as below, EF 60-65%.  His dyspnea has resolved. He feels much better. He does note that 1 week ago he had an episode of significant chest discomfort across his whole chest lasting 6-7 hours. This occurred hours after eating chips and salsa. He  denied any associated nausea, dyspnea, palpitations or syncope. The pain was not worse with exertion, inspiration, palpation or specific movements. It was completely unresponsive to NTG. He took a Vicodin and went to sleep and it resolved. The pain recurred to a lesser degree several days ago and resolved with Vicodin. The only thing that made it worse was thinking about it. He has since gradually eased back into ADLs without any exertional CP or DOE. He has been off PPI for a few days. He also wonders if the Protonix dose is not strong enough - he was prescribed 40mg  daily. He was previously taking Nexium 40mg  daily (equivalent dose of Protonix is 80mg  daily).  Past Medical History  Diagnosis Date  . Hypertension   . Hyperlipidemia     under control  . Environmental allergies     dry cough  . OCD (obsessive compulsive disorder)   . SUI (stress urinary incontinence), male     S/P ROBOTIC PROSTATECTOMY AND RADIATION [place]  . Hypothyroidism   . Depression   . Panic disorder   . GERD (gastroesophageal reflux disease)   . Allergic rhinitis   . ED (erectile dysfunction)   . PONV (postoperative nausea and vomiting)     after thyroid surgery no problems in 10 years   . Sinus bradycardia     usually in 50's  . History of hiatal hernia   . Coronary artery disease     a. 05/2014 Cath/PCI: LM nl, LAD 40p, 60-70d (FFR 0.80->3.0x24 Synergy DES), D2 40, LCX 90d (2.25x12 Synergy DES), RCA 20-45m, EF 60-65%.  . Arthritis     "a little bit in some of my joints" (06/20/2014)  . Hydronephrosis  of right kidney     a. s/p ureteral stenting in the past.  . Prostate cancer     prostate cancer- robotic prostatectomy - followed by radiation because of positive lymph node--and pt on lupron injections    Past Surgical History  Procedure Laterality Date  . Robot assisted laparoscopic radical prostatectomy  05/2010  . Thyroidectomy, partial  10/1974  . Cystoscopy  11/30/2011    Procedure: CYSTOSCOPY;  Surgeon: Reece Packer, MD;  Location: [facility];  Service: Urology;  Laterality: N/A;  Inplantation of Artificial Sphincter and Cystoscopy  . Urinary sphincter implant  11/30/2011    Procedure: ARTIFICIAL URINARY SPHINCTER;  Surgeon: Reece Packer, MD;  Location: [facility];  Service: Urology;  Laterality: N/A;  . Total thyroidectomy  10/2012    "nuked it"  . Upper gi endoscopy      food impaction 2009 done by Dr Oletta Lamas  . Refractive surgery Bilateral 2004  . Tonsillectomy  1956   . Colonoscopy  07/2013    Dr. Oletta Lamas  . Cystoscopy with retrograde pyelogram, ureteroscopy and stent placement Right 02/11/2014    Procedure: CYSTOSCOPY WITH RETROGRADE PYELOGRAM, URETEROSCOPY ,URETERAL BIOPSY AND STENT PLACEMENT;  Surgeon: Raynelle Bring, MD;  Location: [facility];  Service: Urology;  Laterality: Right;  . Cystoscopy with retrograde pyelogram, ureteroscopy and stent placement Right 04/15/2014    Procedure: [place], URETEROSCOPY, BIOPSY AND STENT PLACEMENT;  Surgeon: Raynelle Bring, MD;  Location: [facility];  Service: Urology;  Laterality: Right;  . Coronary angioplasty with stent placement  06/20/2014    "2"  . Left heart catheterization with coronary angiogram N/A 06/20/2014    Procedure: LEFT HEART CATHETERIZATION WITH CORONARY ANGIOGRAM;  Surgeon: Leonie Man, MD;  Location: [facility] CATH LAB;  Service: Cardiovascular;  Laterality: N/A;  . Percutaneous coronary stent intervention (pci-s)  06/20/2014    Procedure: PERCUTANEOUS CORONARY STENT INTERVENTION (PCI-S);  Surgeon: Leonie Man, MD;  Location: [facility] CATH LAB;  Service: Cardiovascular;;  LAD and Circumflex    Current Outpatient Prescriptions  Medication Sig Dispense Refill  . amLODipine (NORVASC) 5 MG tablet Take 1 tablet (5 mg total) by mouth every morning. (Patient taking differently: Take 5 mg by mouth every morning. Takes with 2.5 mg to equal 7.5 mg dose) 90 tablet 3  . aspirin EC 81 MG tablet Take 81 mg by mouth daily.    Marland Kitchen  atorvastatin (LIPITOR) 40 MG tablet Take 1 tablet (40 mg total) by mouth every morning. 90 tablet 3  . clopidogrel (PLAVIX) 75 MG tablet Take 1 tablet (75 mg total) by mouth daily with breakfast. 30 tablet 6  . diazepam (VALIUM) 5 MG tablet Take 5 mg by mouth every 6 (six) hours as needed for anxiety.    . fluvoxaMINE (LUVOX) 50 MG tablet Take 150 mg by mouth every morning.     Marland Kitchen gemfibrozil (LOPID) 600 MG tablet Take 600 mg by mouth 2 (two) times daily.     Marland Kitchen HYDROcodone-acetaminophen (NORCO/VICODIN) 5-325 MG per tablet Take 1-2 tablets by mouth every 6 (six) hours as needed. (Patient taking differently: Take 1-2 tablets by mouth every 6 (six) hours as needed for moderate pain. ) 25 tablet 0  . levothyroxine (SYNTHROID, LEVOTHROID) 150 MCG tablet Take 1 tablet (150 mcg total) by mouth daily before breakfast. 90 tablet 1  . lisinopril (PRINIVIL,ZESTRIL) 20 MG tablet Take 20 mg by mouth every morning.     . nitroGLYCERIN (NITROSTAT) 0.4 MG SL tablet Place 1 tablet (  0.4 mg total) under the tongue every 5 (five) minutes as needed for chest pain. 25 tablet 3  . pantoprazole (PROTONIX) 40 MG tablet Take 1 tablet (40 mg total) by mouth daily. 30 tablet 6  . zolpidem (AMBIEN) 10 MG tablet Take 10 mg by mouth at bedtime as needed for sleep.      No current facility-administered medications for this visit.    Allergies:   Niacin and related and Tagamet   Social History:  The patient  reports that he has never smoked. He has never used smokeless tobacco. He reports that he drinks alcohol. He reports that he does not use illicit drugs.   Family History:  The patient's family history includes CAD in his brother; Cancer in his father; Heart disease in his father; Hyperlipidemia in his father.  ROS:  Please see the history of present illness.  All other systems are reviewed and otherwise negative.   PHYSICAL EXAM:  VS:  BP 122/68 mmHg  Pulse 58  Ht 5\' 11"  (1.803 m)  Wt 262 lb (118.842 kg)  BMI 36.56  kg/m2  SpO2 95% BMI: Body mass index is 36.56 kg/(m^2). Well nourished, well developed, in no acute distress HEENT: normocephalic, atraumatic Neck: no JVD Cardiac:  normal S1, S2; RRR; no murmur Lungs:  clear to auscultation bilaterally, no wheezing, rhonchi or rales Abd: soft, nontender, no hepatomegaly Ext: no edema, right radial cath site with no ecchymosis/hematoma, good pulse Skin: warm and dry Neuro:  moves all extremities spontaneously, no focal abnormalities noted  EKG: SB 58bpm no acute ST-T changes  Recent Labs: 04/19/2014: ALT 17 06/14/2014: TSH 2.68 06/21/2014: BUN 10; Creatinine 1.12; Hemoglobin 12.4*; Platelets 238; Potassium 4.0; Sodium 135  04/19/2014: Cholesterol, Total 124; HDL Cholesterol by NMR 35.20*; LDL (calc) 58; Total CHOL/HDL Ratio 4; Triglycerides 156.0*; VLDL 31.2   Estimated Creatinine Clearance: 89.7 mL/min (by C-G formula based on Cr of 1.12).   Wt Readings from Last 3 Encounters:  07/04/14 262 lb (118.842 kg)  07/04/14 262 lb 12.6 oz (119.2 kg)  06/20/14 260 lb (117.935 kg)     Other studies reviewed: Additional studies/records reviewed today include: cath as above.  ASSESSMENT AND PLAN:  1. CAD s/p PCI as above, with recent recurrence of CP - DOE (which was his anginal equivalent) has resolved. I believe he is doing well post-cath. Continue current regimen, ultimate duration of ASA/Plavix TBD by his primary cardiologist. No BB due to baseline sinus bradycardia. His chest pain sounds atypical in nature - will observe for recurrence or further warning signs. I requested that he increase PPI to Protonix 80mg  daily. This is the equivalent dose of his previous Nexium 40mg  daily (so maybe Protonix 40mg  daily was not strong enough). We discussed observation for recurrent symptoms, including return/ER precautions. He begins cardiac rehab on Monday and will notify our office if there are any problems in the interim or during the program. 2. GERD - as  above. 3. Essential HTN - controlled. He is on a regimen of amlodipine 5mg  + 2.5mg  tablet daily (7.5mg  total). He says this has caused a lot of confusion at his pharmacy and in our office as well. He requests that we change this to amlodipine 2.5mg  - 3 tablets daily. (We offered 1.5 tabs of the 5mg  dose but he prefers the 2.5mg  size). 4. Dyslipidemia - LDL in 03/2014 was 58 on current regimen. Will defer to Dr. Radford Pax regarding further statin escalation. 5. Dilated aortic root - Dr. Theodosia Blender prior notes  indicate she plans f/u echo 10/2014. Will place order. 6. Sinus bradycardia - chronic, asymptomatic.  Disposition: F/u with Dr. Radford Pax in 2 months, sooner if needed.  Cath site Repeat echo 10/2014 How is cough  Current medicines are reviewed at length with the patient today.  See above regarding med changes.  Raechel Ache PA-C 07/04/2014 10:58 AM     [facility] [place] [address] [phone] (office)  [phone] (fax)

## 2014-07-04 NOTE — Progress Notes (Signed)
Cardiac Rehab Medication Review by a Pharmacist  Does the patient  feel that his/her medications are working for him/her?  Yes - may need more protonix  Has the patient been experiencing any side effects to the medications prescribed?  no  Does the patient measure his/her own blood pressure or blood glucose at home?  yes   Does the patient have any problems obtaining medications due to transportation or finances?   no  Understanding of regimen: fair Understanding of indications: fair Potential of compliance: good   Pharmacist comments: Patient has no barriers to medications and remembers to take his medications everyday.  He measures his blood pressure at home occasionally and is not experiencing any side effects.  He does have occasional chest pain that he relates to acid reflux and is going to ask his physician about increasing the dose of his Protonix today.  No questions for the pharmacist.  Cassie L. Nicole Kindred, PharmD Clinical Pharmacy Resident Pager: [phone] 07/04/2014 8:16 AM

## 2014-07-08 ENCOUNTER — Encounter (HOSPITAL_COMMUNITY): Payer: BLUE CROSS/BLUE SHIELD

## 2014-07-08 ENCOUNTER — Encounter (HOSPITAL_COMMUNITY)
Admission: RE | Admit: 2014-07-08 | Discharge: 2014-07-08 | Disposition: A | Discharge status: Home / Self Care | Payer: BLUE CROSS/BLUE SHIELD | Source: Ambulatory Visit | Attending: Cardiology | Admitting: Cardiology

## 2014-07-08 ENCOUNTER — Telehealth: Payer: Self-pay | Admitting: Physician Assistant

## 2014-07-08 DIAGNOSIS — I10 Essential (primary) hypertension: Secondary | ICD-10-CM

## 2014-07-08 MED ORDER — AMLODIPINE BESYLATE 2.5 MG PO TABS: 2.5000 mg | ORAL_TABLET | Freq: Every day | ORAL | Status: DC

## 2014-07-08 NOTE — Telephone Encounter (Signed)
Received call from [facility] with cardiac rehab today. 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  Manual BP 88/60 on arrival, HR stable. Orthostatics auto-cuff: Lying 120/73 - HR 53 Sitting 121/75, HR 60 Standing 109/66 -> 99/46, HR 53 with mild dizziness No other symptoms today. Maria encouraged hydration. She would like to hold off on class today and pick up on Wednesday as planned which is reasonable. I also asked Mr. Montminy to decrease his Norvasc from 7.5mg  daily to 2.5mg  daily and to follow BP at home and at cardiac rehab. Med dose adjusted in Epic. I asked them to notify us of any low readings or if BP begins to creep up. Both Maria and the patient verbalized understanding of plan. Kelleen Stolze PA-C

## 2014-07-08 NOTE — Progress Notes (Signed)
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  Blood pressure R arm 88/60. Telemetry rhythm Sinus 65.  Orthostatic blood pressures checked via automatic cuff. Lying blood pressure 120/73 heart rate 53.  Sitting blood pressure 121/75 heart rate 60. Standing blood pressure 109/66 left arm. Right arm 99/46.  Blake Carter called and notified. Patient reports feeling lightheaded when he changes positions. Dana instructed Blake Carter to decrease his amlodipine to 2,5 mg once a day.  Blake Carter did not exercise today but plans to return to exercise on Wednesday. PHQ score=0.

## 2014-07-10 ENCOUNTER — Encounter (HOSPITAL_COMMUNITY)
Admission: RE | Admit: 2014-07-10 | Discharge: 2014-07-10 | Disposition: A | Discharge status: Home / Self Care | Payer: BLUE CROSS/BLUE SHIELD | Source: Ambulatory Visit | Attending: Cardiology | Admitting: Cardiology

## 2014-07-10 DIAGNOSIS — E785 Hyperlipidemia, unspecified: Secondary | ICD-10-CM | POA: Diagnosis not present

## 2014-07-10 DIAGNOSIS — I77810 Thoracic aortic ectasia: Secondary | ICD-10-CM | POA: Diagnosis not present

## 2014-07-10 DIAGNOSIS — I2510 Atherosclerotic heart disease of native coronary artery without angina pectoris: Secondary | ICD-10-CM | POA: Diagnosis not present

## 2014-07-10 DIAGNOSIS — F329 Major depressive disorder, single episode, unspecified: Secondary | ICD-10-CM | POA: Diagnosis not present

## 2014-07-10 DIAGNOSIS — I2584 Coronary atherosclerosis due to calcified coronary lesion: Secondary | ICD-10-CM | POA: Diagnosis not present

## 2014-07-10 DIAGNOSIS — Z79899 Other long term (current) drug therapy: Secondary | ICD-10-CM | POA: Diagnosis not present

## 2014-07-10 DIAGNOSIS — Z955 Presence of coronary angioplasty implant and graft: Secondary | ICD-10-CM | POA: Diagnosis not present

## 2014-07-10 DIAGNOSIS — E890 Postprocedural hypothyroidism: Secondary | ICD-10-CM | POA: Diagnosis not present

## 2014-07-10 DIAGNOSIS — Z8546 Personal history of malignant neoplasm of prostate: Secondary | ICD-10-CM | POA: Diagnosis not present

## 2014-07-10 DIAGNOSIS — R001 Bradycardia, unspecified: Secondary | ICD-10-CM | POA: Diagnosis not present

## 2014-07-10 DIAGNOSIS — Z7982 Long term (current) use of aspirin: Secondary | ICD-10-CM | POA: Diagnosis not present

## 2014-07-10 DIAGNOSIS — Z5189 Encounter for other specified aftercare: Secondary | ICD-10-CM | POA: Diagnosis present

## 2014-07-10 DIAGNOSIS — Z8249 Family history of ischemic heart disease and other diseases of the circulatory system: Secondary | ICD-10-CM | POA: Diagnosis not present

## 2014-07-10 DIAGNOSIS — I10 Essential (primary) hypertension: Secondary | ICD-10-CM | POA: Diagnosis not present

## 2014-07-10 DIAGNOSIS — K219 Gastro-esophageal reflux disease without esophagitis: Secondary | ICD-10-CM | POA: Diagnosis not present

## 2014-07-10 NOTE — Progress Notes (Signed)
Pt started cardiac rehab today.  Pt tolerated light exercise without difficulty. Vital signs stable. Will continue to monitor the patient throughout  the program.

## 2014-07-12 ENCOUNTER — Other Ambulatory Visit (HOSPITAL_COMMUNITY): Payer: Self-pay | Admitting: Urology

## 2014-07-12 ENCOUNTER — Other Ambulatory Visit: Payer: Self-pay

## 2014-07-12 ENCOUNTER — Encounter (HOSPITAL_COMMUNITY)
Admission: RE | Admit: 2014-07-12 | Discharge: 2014-07-12 | Disposition: A | Discharge status: Home / Self Care | Payer: BLUE CROSS/BLUE SHIELD | Source: Ambulatory Visit | Attending: Cardiology | Admitting: Cardiology

## 2014-07-12 DIAGNOSIS — N135 Crossing vessel and stricture of ureter without hydronephrosis: Secondary | ICD-10-CM

## 2014-07-12 DIAGNOSIS — Z5189 Encounter for other specified aftercare: Secondary | ICD-10-CM | POA: Diagnosis not present

## 2014-07-12 DIAGNOSIS — I10 Essential (primary) hypertension: Secondary | ICD-10-CM

## 2014-07-12 MED ORDER — AMLODIPINE BESYLATE 2.5 MG PO TABS: 2.5000 mg | ORAL_TABLET | Freq: Every day | ORAL | Status: DC

## 2014-07-12 NOTE — Progress Notes (Addendum)
Patient reports having chest discomfort in his upper right and left chest.  Blake Carter rates the pain a 2 on a 1-10 scale. Blake Carter said he a sublingual nitroglycerin last night without any relief.  Blake Carter says the pain is intermittent and has been going on for two weeks. Telemetry rhythm Sinus Rhythm heart rate 61.  Blood pressure 112/60. Oxygen saturation 97% on room air. Blake Range PA called and notified. Blake Carter said a 12 lead ECG is not needed at this time. Blake Carter said to let Mr Blake Carter exercise if the patient has any complaints of chest pain at Cardiac rehab to send the patient to the ED Mr Blake Carter is currently pain free at this time. Blake Carter denied having mid sternal chest pain. Blake Carter said Blake Carter does not need any additional follow up Will continue to monitor the patient throughout  the program.

## 2014-07-15 ENCOUNTER — Encounter (HOSPITAL_COMMUNITY)
Admission: RE | Admit: 2014-07-15 | Discharge: 2014-07-15 | Disposition: A | Discharge status: Home / Self Care | Payer: BLUE CROSS/BLUE SHIELD | Source: Ambulatory Visit | Attending: Cardiology | Admitting: Cardiology

## 2014-07-15 ENCOUNTER — Encounter: Payer: Self-pay | Admitting: Cardiology

## 2014-07-15 DIAGNOSIS — Z5189 Encounter for other specified aftercare: Secondary | ICD-10-CM | POA: Diagnosis not present

## 2014-07-15 NOTE — Progress Notes (Signed)
Reviewed home exercise with pt today.  Pt plans to walk and use treadmill at home for exercise.  Reviewed THR, pulse, RPE, sign and symptoms, NTG use, and when to call 911 or MD.  Pt voiced understanding. Jessica Hawkins, MA, ACSM RCEP  

## 2014-07-17 ENCOUNTER — Encounter (HOSPITAL_COMMUNITY)
Admission: RE | Admit: 2014-07-17 | Discharge: 2014-07-17 | Disposition: A | Discharge status: Home / Self Care | Payer: BLUE CROSS/BLUE SHIELD | Source: Ambulatory Visit | Attending: Cardiology | Admitting: Cardiology

## 2014-07-17 DIAGNOSIS — Z5189 Encounter for other specified aftercare: Secondary | ICD-10-CM | POA: Diagnosis not present

## 2014-07-19 ENCOUNTER — Encounter (HOSPITAL_COMMUNITY)
Admission: RE | Admit: 2014-07-19 | Discharge: 2014-07-19 | Disposition: A | Discharge status: Home / Self Care | Payer: BLUE CROSS/BLUE SHIELD | Source: Ambulatory Visit | Attending: Cardiology | Admitting: Cardiology

## 2014-07-19 ENCOUNTER — Encounter (HOSPITAL_COMMUNITY): Payer: Self-pay

## 2014-07-19 ENCOUNTER — Ambulatory Visit (HOSPITAL_COMMUNITY)
Admission: RE | Admit: 2014-07-19 | Discharge: 2014-07-19 | Disposition: A | Discharge status: Home / Self Care | Payer: BLUE CROSS/BLUE SHIELD | Source: Ambulatory Visit | Attending: Urology | Admitting: Urology

## 2014-07-19 DIAGNOSIS — Z5189 Encounter for other specified aftercare: Secondary | ICD-10-CM | POA: Diagnosis not present

## 2014-07-19 DIAGNOSIS — N131 Hydronephrosis with ureteral stricture, not elsewhere classified: Secondary | ICD-10-CM | POA: Diagnosis present

## 2014-07-19 DIAGNOSIS — N135 Crossing vessel and stricture of ureter without hydronephrosis: Secondary | ICD-10-CM

## 2014-07-19 MED ORDER — TECHNETIUM TC 99M MERTIATIDE
16.0000 | Freq: Once | INTRAVENOUS | Status: AC | PRN
  Administered 2014-07-19: 16 via INTRAVENOUS

## 2014-07-19 MED ORDER — FUROSEMIDE 10 MG/ML IJ SOLN
60.0000 mg | Freq: Once | INTRAMUSCULAR | Status: AC
  Administered 2014-07-19: 60 mg via INTRAVENOUS
  Filled 2014-07-19: qty 6

## 2014-07-19 NOTE — Progress Notes (Signed)
Patient says he had a kidney scan this morning by his urologist and was given 60 mg of IV lasix at the doctor's office. Patient complained of feeling washed out blood pressure 104/ 62. Patient said he ate lunch at about 12:30.  Patient was given Gatorade and a few saltine crackers and peanut butter. Patient had no further complaints during exercise. Vital signs stable. Will continue to monitor the patient throughout  the program.

## 2014-07-22 ENCOUNTER — Encounter (HOSPITAL_COMMUNITY)
Admission: RE | Admit: 2014-07-22 | Discharge: 2014-07-22 | Disposition: A | Discharge status: Home / Self Care | Payer: BLUE CROSS/BLUE SHIELD | Source: Ambulatory Visit | Attending: Cardiology | Admitting: Cardiology

## 2014-07-22 DIAGNOSIS — Z5189 Encounter for other specified aftercare: Secondary | ICD-10-CM | POA: Diagnosis not present

## 2014-07-24 ENCOUNTER — Encounter (HOSPITAL_COMMUNITY)
Admission: RE | Admit: 2014-07-24 | Discharge: 2014-07-24 | Disposition: A | Discharge status: Home / Self Care | Payer: BLUE CROSS/BLUE SHIELD | Source: Ambulatory Visit | Attending: Cardiology | Admitting: Cardiology

## 2014-07-24 DIAGNOSIS — Z5189 Encounter for other specified aftercare: Secondary | ICD-10-CM | POA: Diagnosis not present

## 2014-07-26 ENCOUNTER — Encounter (HOSPITAL_COMMUNITY)
Admission: RE | Admit: 2014-07-26 | Discharge: 2014-07-26 | Disposition: A | Discharge status: Home / Self Care | Payer: BLUE CROSS/BLUE SHIELD | Source: Ambulatory Visit | Attending: Cardiology | Admitting: Cardiology

## 2014-07-26 DIAGNOSIS — Z5189 Encounter for other specified aftercare: Secondary | ICD-10-CM | POA: Diagnosis not present

## 2014-07-29 ENCOUNTER — Encounter (HOSPITAL_COMMUNITY)
Admission: RE | Admit: 2014-07-29 | Discharge: 2014-07-29 | Disposition: A | Discharge status: Home / Self Care | Payer: BLUE CROSS/BLUE SHIELD | Source: Ambulatory Visit | Attending: Cardiology | Admitting: Cardiology

## 2014-07-29 DIAGNOSIS — Z5189 Encounter for other specified aftercare: Secondary | ICD-10-CM | POA: Diagnosis not present

## 2014-07-31 ENCOUNTER — Encounter (HOSPITAL_COMMUNITY)
Admission: RE | Admit: 2014-07-31 | Discharge: 2014-07-31 | Disposition: A | Discharge status: Home / Self Care | Payer: BLUE CROSS/BLUE SHIELD | Source: Ambulatory Visit | Attending: Cardiology | Admitting: Cardiology

## 2014-07-31 DIAGNOSIS — E785 Hyperlipidemia, unspecified: Secondary | ICD-10-CM | POA: Insufficient documentation

## 2014-07-31 DIAGNOSIS — E890 Postprocedural hypothyroidism: Secondary | ICD-10-CM | POA: Diagnosis not present

## 2014-07-31 DIAGNOSIS — Z8546 Personal history of malignant neoplasm of prostate: Secondary | ICD-10-CM | POA: Insufficient documentation

## 2014-07-31 DIAGNOSIS — Z7982 Long term (current) use of aspirin: Secondary | ICD-10-CM | POA: Insufficient documentation

## 2014-07-31 DIAGNOSIS — I10 Essential (primary) hypertension: Secondary | ICD-10-CM | POA: Insufficient documentation

## 2014-07-31 DIAGNOSIS — I77810 Thoracic aortic ectasia: Secondary | ICD-10-CM | POA: Diagnosis not present

## 2014-07-31 DIAGNOSIS — Z5189 Encounter for other specified aftercare: Secondary | ICD-10-CM | POA: Insufficient documentation

## 2014-07-31 DIAGNOSIS — Z955 Presence of coronary angioplasty implant and graft: Secondary | ICD-10-CM | POA: Diagnosis not present

## 2014-07-31 DIAGNOSIS — I2510 Atherosclerotic heart disease of native coronary artery without angina pectoris: Secondary | ICD-10-CM | POA: Insufficient documentation

## 2014-07-31 DIAGNOSIS — I2584 Coronary atherosclerosis due to calcified coronary lesion: Secondary | ICD-10-CM | POA: Insufficient documentation

## 2014-07-31 DIAGNOSIS — Z8249 Family history of ischemic heart disease and other diseases of the circulatory system: Secondary | ICD-10-CM | POA: Diagnosis not present

## 2014-07-31 DIAGNOSIS — R001 Bradycardia, unspecified: Secondary | ICD-10-CM | POA: Insufficient documentation

## 2014-07-31 DIAGNOSIS — Z79899 Other long term (current) drug therapy: Secondary | ICD-10-CM | POA: Diagnosis not present

## 2014-07-31 DIAGNOSIS — K219 Gastro-esophageal reflux disease without esophagitis: Secondary | ICD-10-CM | POA: Diagnosis not present

## 2014-07-31 DIAGNOSIS — F329 Major depressive disorder, single episode, unspecified: Secondary | ICD-10-CM | POA: Insufficient documentation

## 2014-08-02 ENCOUNTER — Encounter (HOSPITAL_COMMUNITY)
Admission: RE | Admit: 2014-08-02 | Discharge: 2014-08-02 | Disposition: A | Discharge status: Home / Self Care | Payer: BLUE CROSS/BLUE SHIELD | Source: Ambulatory Visit | Attending: Cardiology | Admitting: Cardiology

## 2014-08-02 DIAGNOSIS — Z5189 Encounter for other specified aftercare: Secondary | ICD-10-CM | POA: Diagnosis not present

## 2014-08-05 ENCOUNTER — Encounter (HOSPITAL_COMMUNITY)
Admission: RE | Admit: 2014-08-05 | Discharge: 2014-08-05 | Disposition: A | Discharge status: Home / Self Care | Payer: BLUE CROSS/BLUE SHIELD | Source: Ambulatory Visit | Attending: Cardiology | Admitting: Cardiology

## 2014-08-05 DIAGNOSIS — Z5189 Encounter for other specified aftercare: Secondary | ICD-10-CM | POA: Diagnosis not present

## 2014-08-06 ENCOUNTER — Other Ambulatory Visit: Payer: Self-pay | Admitting: *Deleted

## 2014-08-06 MED ORDER — CLOPIDOGREL BISULFATE 75 MG PO TABS: 75.0000 mg | ORAL_TABLET | Freq: Every day | ORAL | Status: DC

## 2014-08-07 ENCOUNTER — Encounter (HOSPITAL_COMMUNITY): Payer: BLUE CROSS/BLUE SHIELD

## 2014-08-09 ENCOUNTER — Encounter (HOSPITAL_COMMUNITY)
Admission: RE | Admit: 2014-08-09 | Discharge: 2014-08-09 | Disposition: A | Discharge status: Home / Self Care | Payer: BLUE CROSS/BLUE SHIELD | Source: Ambulatory Visit | Attending: Cardiology | Admitting: Cardiology

## 2014-08-09 DIAGNOSIS — Z5189 Encounter for other specified aftercare: Secondary | ICD-10-CM | POA: Diagnosis not present

## 2014-08-12 ENCOUNTER — Encounter (HOSPITAL_COMMUNITY)
Admission: RE | Admit: 2014-08-12 | Discharge: 2014-08-12 | Disposition: A | Discharge status: Home / Self Care | Payer: BLUE CROSS/BLUE SHIELD | Source: Ambulatory Visit | Attending: Cardiology | Admitting: Cardiology

## 2014-08-12 DIAGNOSIS — Z5189 Encounter for other specified aftercare: Secondary | ICD-10-CM | POA: Diagnosis not present

## 2014-08-14 ENCOUNTER — Encounter (HOSPITAL_COMMUNITY)
Admission: RE | Admit: 2014-08-14 | Discharge: 2014-08-14 | Disposition: A | Discharge status: Home / Self Care | Payer: BLUE CROSS/BLUE SHIELD | Source: Ambulatory Visit | Attending: Cardiology | Admitting: Cardiology

## 2014-08-14 DIAGNOSIS — Z5189 Encounter for other specified aftercare: Secondary | ICD-10-CM | POA: Diagnosis not present

## 2014-08-15 NOTE — Progress Notes (Signed)
Blake Carter 64 y.o. male Nutrition Note Spoke with pt. Pt seen 08/14/14. Nutrition Survey reviewed with pt. Pt is following Step 2 of the Therapeutic Lifestyle Changes diet. Pt wants to lose wt. Pt has been trying to lose wt by watching portion sizes. Per pt, "I know what to do, I just have to do it." Wt loss tips briefly reviewed. Pt expressed understanding of the information reviewed. Pt aware of nutrition education classes offered.  Nutrition Diagnosis ? Food-and nutrition-related knowledge deficit related to lack of exposure to information as related to diagnosis of: ? CVD  ? Obesity related to excessive energy intake as evidenced by a BMI of 36.7  Nutrition Intervention ? Benefits of adopting Therapeutic Lifestyle Changes discussed when Medficts reviewed. ? Pt to attend the Portion Distortion class- met;07/31/14 ? Pt to attend the  ? Nutrition I class                     ? Nutrition II class - met; 08/06/14 ? Continue client-centered nutrition education by RD, as part of interdisciplinary care.  Goal(s) ? Pt to identify food quantities necessary to achieve: ? wt loss to a goal wt of 238-256 lb (108.3-116.5 kg) at graduation from cardiac rehab.   Monitor and Evaluate progress toward nutrition goal with team.   Derek Mound, M.Ed, RD, LDN, CDE 08/15/2014 9:08 AM

## 2014-08-16 ENCOUNTER — Encounter (HOSPITAL_COMMUNITY)
Admission: RE | Admit: 2014-08-16 | Discharge: 2014-08-16 | Disposition: A | Discharge status: Home / Self Care | Payer: BLUE CROSS/BLUE SHIELD | Source: Ambulatory Visit | Attending: Cardiology | Admitting: Cardiology

## 2014-08-16 DIAGNOSIS — Z5189 Encounter for other specified aftercare: Secondary | ICD-10-CM | POA: Diagnosis not present

## 2014-08-19 ENCOUNTER — Encounter (HOSPITAL_COMMUNITY)
Admission: RE | Admit: 2014-08-19 | Discharge: 2014-08-19 | Disposition: A | Discharge status: Home / Self Care | Payer: BLUE CROSS/BLUE SHIELD | Source: Ambulatory Visit | Attending: Cardiology | Admitting: Cardiology

## 2014-08-19 DIAGNOSIS — Z5189 Encounter for other specified aftercare: Secondary | ICD-10-CM | POA: Diagnosis not present

## 2014-08-21 ENCOUNTER — Encounter (HOSPITAL_COMMUNITY)
Admission: RE | Admit: 2014-08-21 | Discharge: 2014-08-21 | Disposition: A | Discharge status: Home / Self Care | Payer: BLUE CROSS/BLUE SHIELD | Source: Ambulatory Visit | Attending: Cardiology | Admitting: Cardiology

## 2014-08-21 DIAGNOSIS — Z5189 Encounter for other specified aftercare: Secondary | ICD-10-CM | POA: Diagnosis not present

## 2014-08-23 ENCOUNTER — Encounter (HOSPITAL_COMMUNITY)
Admission: RE | Admit: 2014-08-23 | Discharge: 2014-08-23 | Disposition: A | Discharge status: Home / Self Care | Payer: BLUE CROSS/BLUE SHIELD | Source: Ambulatory Visit | Attending: Cardiology | Admitting: Cardiology

## 2014-08-23 DIAGNOSIS — Z5189 Encounter for other specified aftercare: Secondary | ICD-10-CM | POA: Diagnosis not present

## 2014-08-26 ENCOUNTER — Encounter (HOSPITAL_COMMUNITY)
Admission: RE | Admit: 2014-08-26 | Discharge: 2014-08-26 | Disposition: A | Discharge status: Home / Self Care | Payer: BLUE CROSS/BLUE SHIELD | Source: Ambulatory Visit | Attending: Cardiology | Admitting: Cardiology

## 2014-08-26 DIAGNOSIS — Z5189 Encounter for other specified aftercare: Secondary | ICD-10-CM | POA: Diagnosis not present

## 2014-08-28 ENCOUNTER — Encounter (HOSPITAL_COMMUNITY): Payer: BLUE CROSS/BLUE SHIELD

## 2014-08-30 ENCOUNTER — Encounter (HOSPITAL_COMMUNITY)
Admission: RE | Admit: 2014-08-30 | Discharge: 2014-08-30 | Disposition: A | Discharge status: Home / Self Care | Payer: BLUE CROSS/BLUE SHIELD | Source: Ambulatory Visit | Attending: Cardiology | Admitting: Cardiology

## 2014-08-30 DIAGNOSIS — Z7982 Long term (current) use of aspirin: Secondary | ICD-10-CM | POA: Diagnosis not present

## 2014-08-30 DIAGNOSIS — I2584 Coronary atherosclerosis due to calcified coronary lesion: Secondary | ICD-10-CM | POA: Diagnosis not present

## 2014-08-30 DIAGNOSIS — R001 Bradycardia, unspecified: Secondary | ICD-10-CM | POA: Insufficient documentation

## 2014-08-30 DIAGNOSIS — Z955 Presence of coronary angioplasty implant and graft: Secondary | ICD-10-CM | POA: Diagnosis not present

## 2014-08-30 DIAGNOSIS — Z8546 Personal history of malignant neoplasm of prostate: Secondary | ICD-10-CM | POA: Insufficient documentation

## 2014-08-30 DIAGNOSIS — Z79899 Other long term (current) drug therapy: Secondary | ICD-10-CM | POA: Insufficient documentation

## 2014-08-30 DIAGNOSIS — E785 Hyperlipidemia, unspecified: Secondary | ICD-10-CM | POA: Diagnosis not present

## 2014-08-30 DIAGNOSIS — I77810 Thoracic aortic ectasia: Secondary | ICD-10-CM | POA: Diagnosis not present

## 2014-08-30 DIAGNOSIS — Z8249 Family history of ischemic heart disease and other diseases of the circulatory system: Secondary | ICD-10-CM | POA: Diagnosis not present

## 2014-08-30 DIAGNOSIS — K219 Gastro-esophageal reflux disease without esophagitis: Secondary | ICD-10-CM | POA: Diagnosis not present

## 2014-08-30 DIAGNOSIS — E890 Postprocedural hypothyroidism: Secondary | ICD-10-CM | POA: Insufficient documentation

## 2014-08-30 DIAGNOSIS — F329 Major depressive disorder, single episode, unspecified: Secondary | ICD-10-CM | POA: Diagnosis not present

## 2014-08-30 DIAGNOSIS — Z5189 Encounter for other specified aftercare: Secondary | ICD-10-CM | POA: Insufficient documentation

## 2014-08-30 DIAGNOSIS — I2510 Atherosclerotic heart disease of native coronary artery without angina pectoris: Secondary | ICD-10-CM | POA: Diagnosis not present

## 2014-08-30 DIAGNOSIS — I10 Essential (primary) hypertension: Secondary | ICD-10-CM | POA: Insufficient documentation

## 2014-09-02 ENCOUNTER — Encounter (HOSPITAL_COMMUNITY)
Admission: RE | Admit: 2014-09-02 | Discharge: 2014-09-02 | Disposition: A | Discharge status: Home / Self Care | Payer: BLUE CROSS/BLUE SHIELD | Source: Ambulatory Visit | Attending: Cardiology | Admitting: Cardiology

## 2014-09-02 DIAGNOSIS — Z5189 Encounter for other specified aftercare: Secondary | ICD-10-CM | POA: Diagnosis not present

## 2014-09-04 ENCOUNTER — Encounter (HOSPITAL_COMMUNITY)
Admission: RE | Admit: 2014-09-04 | Discharge: 2014-09-04 | Disposition: A | Discharge status: Home / Self Care | Payer: BLUE CROSS/BLUE SHIELD | Source: Ambulatory Visit | Attending: Cardiology | Admitting: Cardiology

## 2014-09-04 DIAGNOSIS — Z5189 Encounter for other specified aftercare: Secondary | ICD-10-CM | POA: Diagnosis not present

## 2014-09-04 NOTE — Progress Notes (Signed)
Cardiology Office Note   Date:  09/05/2014   ID:  Blake Carter, DOB May 07, 1951, MRN [id number]  PCP:  Gennette Pac, MD    Chief Complaint  Patient presents with  . Coronary Artery Disease  . Hypertension  . Hyperlipidemia      History of Present Illness: This is a 64yo male with a history of HTN, dyslipidemia and dilated aortic root who recently underwent chest CT angio for additional measurement of dilated aortic root that had increased in size from last assessment of 3.6cm to 4.1cm. Chest CT also showed coronary artery calcifications so a nuclear stress test was done which showed two reversible perfusion defects: a medium size moderate severity defect in the LAD territory and a small size moderate severity defect in the RCA territory. The inferior defect was felt to be a result of significant extracardiac activity. LV Ejection Fraction: 55%. NL LV Function; NL Wall Motion. He was completely asymptomatic at the time and therefore medically managed but then started having CP and underwent cath showing severe 2 vessel ASCAD with 60-70% mid LAD with positive FFR and 90% distal left circs/p DES to the LAD and distal LCX.  He is on DAPT with ASA/Plavix. He denies any chest pain, SOB, DOE, LE edema, palpitations or syncope. He has had some dizziness with changes in position and has had some low BPs in cardiac rehab in the 75'T systolic    Past Medical History  Diagnosis Date  . Hypertension   . Hyperlipidemia     under control  . Environmental allergies     dry cough  . OCD (obsessive compulsive disorder)   . SUI (stress urinary incontinence), male     S/P ROBOTIC PROSTATECTOMY AND RADIATION TX  . Hypothyroidism   . Depression   . Panic disorder   . GERD (gastroesophageal reflux disease)   . Allergic rhinitis   . ED (erectile dysfunction)   . PONV (postoperative nausea and vomiting)     after thyroid surgery no problems in 10 years   . Sinus bradycardia    usually in 50's  . History of hiatal hernia   . Coronary artery disease     a. 05/2014 Cath/PCI: LM nl, LAD 40p, 60-70d (FFR 0.80->3.0x24 Synergy DES), D2 40, LCX 90d (2.25x12 Synergy DES), RCA 20-83m, EF 60-65%.  . Arthritis     "a little bit in some of my joints" (06/20/2014)  . Hydronephrosis of right kidney     a. s/p ureteral stenting in the past.  . Prostate cancer     prostate cancer- robotic prostatectomy - followed by radiation because of positive lymph node--and pt on lupron injections    Past Surgical History  Procedure Laterality Date  . Robot assisted laparoscopic radical prostatectomy  05/2010  . Thyroidectomy, partial  10/1974  . Cystoscopy  11/30/2011    Procedure: CYSTOSCOPY;  Surgeon: Reece Packer, MD;  Location: [facility];  Service: Urology;  Laterality: N/A;  Inplantation of Artificial Sphincter and Cystoscopy  . Urinary sphincter implant  11/30/2011    Procedure: ARTIFICIAL URINARY SPHINCTER;  Surgeon: Reece Packer, MD;  Location: [facility];  Service: Urology;  Laterality: N/A;  . Total thyroidectomy  10/2012    "nuked it"  . Upper gi endoscopy      food impaction 2009 done by Dr Oletta Lamas  . Refractive surgery Bilateral 2004  . Tonsillectomy  1956   . Colonoscopy  07/2013    Dr. Oletta Lamas  . Cystoscopy with retrograde  pyelogram, ureteroscopy and stent placement Right 02/11/2014    Procedure: CYSTOSCOPY WITH RETROGRADE PYELOGRAM, URETEROSCOPY ,URETERAL BIOPSY AND STENT PLACEMENT;  Surgeon: Raynelle Bring, MD;  Location: [facility];  Service: Urology;  Laterality: Right;  . Cystoscopy with retrograde pyelogram, ureteroscopy and stent placement Right 04/15/2014    Procedure: [place], URETEROSCOPY, BIOPSY AND STENT PLACEMENT;  Surgeon: Raynelle Bring, MD;  Location: [facility];  Service: Urology;  Laterality: Right;  . Coronary angioplasty with stent placement  06/20/2014    "2"  . Left heart catheterization with coronary angiogram N/A 06/20/2014     Procedure: LEFT HEART CATHETERIZATION WITH CORONARY ANGIOGRAM;  Surgeon: Leonie Man, MD;  Location: [facility] CATH LAB;  Service: Cardiovascular;  Laterality: N/A;  . Percutaneous coronary stent intervention (pci-s)  06/20/2014    Procedure: PERCUTANEOUS CORONARY STENT INTERVENTION (PCI-S);  Surgeon: Leonie Man, MD;  Location: [facility] CATH LAB;  Service: Cardiovascular;;  LAD and Circumflex     Current Outpatient Prescriptions  Medication Sig Dispense Refill  . aspirin EC 81 MG tablet Take 81 mg by mouth daily.    Marland Kitchen atorvastatin (LIPITOR) 40 MG tablet Take 1 tablet (40 mg total) by mouth every morning. 90 tablet 3  . clopidogrel (PLAVIX) 75 MG tablet Take 1 tablet (75 mg total) by mouth daily with breakfast. 30 tablet 1  . diazepam (VALIUM) 5 MG tablet Take 5 mg by mouth every 6 (six) hours as needed for anxiety.    . fluvoxaMINE (LUVOX) 50 MG tablet Take 150 mg by mouth every morning.     Marland Kitchen gemfibrozil (LOPID) 600 MG tablet Take 600 mg by mouth 2 (two) times daily.     Marland Kitchen HYDROcodone-acetaminophen (NORCO/VICODIN) 5-325 MG per tablet Take 1-2 tablets by mouth every 6 (six) hours as needed. 25 tablet 0  . levothyroxine (SYNTHROID, LEVOTHROID) 150 MCG tablet Take 1 tablet (150 mcg total) by mouth daily before breakfast. 90 tablet 1  . lisinopril (PRINIVIL,ZESTRIL) 20 MG tablet Take 20 mg by mouth every morning.     . nitroGLYCERIN (NITROSTAT) 0.4 MG SL tablet Place 1 tablet (0.4 mg total) under the tongue every 5 (five) minutes as needed for chest pain. 25 tablet 3  . pantoprazole (PROTONIX) 40 MG tablet Take 2 tablets (80 mg total) by mouth daily. 180 tablet 3  . SIMBRINZA 1-0.2 % SUSP Place 1 drop into both eyes 2 (two) times daily.    Marland Kitchen zolpidem (AMBIEN) 10 MG tablet Take 10 mg by mouth at bedtime as needed for sleep.      No current facility-administered medications for this visit.    Allergies:   Niacin and related and Tagamet    Social History:  The patient  reports that he has never  smoked. He has never used smokeless tobacco. He reports that he drinks alcohol. He reports that he does not use illicit drugs.   Family History:  The patient's family history includes CAD in his brother; Cancer in his father; Heart disease in his father; Hyperlipidemia in his father.    ROS:  Please see the history of present illness.   Otherwise, review of systems are positive for none.   All other systems are reviewed and negative.    PHYSICAL EXAM: VS:  BP 90/60 mmHg  Pulse 47  Ht 5\' 11"  (1.803 m)  Wt 247 lb 9.6 oz (112.311 kg)  BMI 34.55 kg/m2  SpO2 95% , BMI Body mass index is 34.55 kg/(m^2). GEN: Well nourished, well developed, in  no acute distress HEENT: normal Neck: no JVD, carotid bruits, or masses Cardiac: RRR; no murmurs, rubs, or gallops,no edema  Respiratory:  clear to auscultation bilaterally, normal work of breathing GI: soft, nontender, nondistended, + BS MS: no deformity or atrophy Skin: warm and dry, no rash Neuro:  Strength and sensation are intact Psych: euthymic mood, full affect   EKG:  EKG is not ordered today.    Recent Labs: 04/19/2014: ALT 17 06/14/2014: TSH 2.68 06/21/2014: BUN 10; Creatinine 1.12; Hemoglobin 12.4*; Platelets 238; Potassium 4.0; Sodium 135    Lipid Panel    Component Value Date/Time   CHOL 124 04/19/2014 0912   TRIG 156.0* 04/19/2014 0912   HDL 35.20* 04/19/2014 0912   CHOLHDL 4 04/19/2014 0912   VLDL 31.2 04/19/2014 0912   LDLCALC 58 04/19/2014 0912      Wt Readings from Last 3 Encounters:  09/05/14 247 lb 9.6 oz (112.311 kg)  07/04/14 262 lb (118.842 kg)  07/04/14 262 lb 12.6 oz (119.2 kg)    ASSESSMENT AND PLAN:  1.  Coronary artery disease with recent cath 05/2014 due to angina showing 2 vessel obstructive ASCAD of the mid LAD and distal LCX s/p PCI with DES to both vessels.  Now on DAPT with ASA and Plavix 2.  HTN - controlled and soft.  He has had some dizziness with changes in position - continue lisinopril -  stop amlodipine given low BP which is also low at Cardiac Rehab 3. Dyslipidemia - last LDL was 58 on 03/2014.  He has lost 15lbs - repeat FLP and ALT in May - continue statin/lopid  4. Mildly dilated aortic root - repeat echo 10/2014    Current medicines are reviewed at length with the patient today.  The patient does not have concerns regarding medicines.  The following changes have been made:  no change  Labs/ tests ordered today include: see above assessment and plan  Orders Placed This Encounter  Procedures  . Lipid panel  . Hepatic function panel  . 2D Echocardiogram with contrast     Disposition:   FU with me in 6 months   Signed, Sueanne Margarita, MD  09/05/2014 9:58 AM    [facility] [address], [place], [place]  [address] Phone: [phone]; Fax: [phone]

## 2014-09-05 ENCOUNTER — Ambulatory Visit (INDEPENDENT_AMBULATORY_CARE_PROVIDER_SITE_OTHER): Payer: BLUE CROSS/BLUE SHIELD | Admitting: Cardiology

## 2014-09-05 ENCOUNTER — Encounter: Payer: Self-pay | Admitting: Cardiology

## 2014-09-05 VITALS — BP 90/60 | HR 47 | Ht 71.0 in | Wt 247.6 lb

## 2014-09-05 DIAGNOSIS — I10 Essential (primary) hypertension: Secondary | ICD-10-CM | POA: Diagnosis not present

## 2014-09-05 DIAGNOSIS — Z9861 Coronary angioplasty status: Secondary | ICD-10-CM

## 2014-09-05 DIAGNOSIS — E785 Hyperlipidemia, unspecified: Secondary | ICD-10-CM

## 2014-09-05 DIAGNOSIS — R001 Bradycardia, unspecified: Secondary | ICD-10-CM | POA: Diagnosis not present

## 2014-09-05 DIAGNOSIS — I2510 Atherosclerotic heart disease of native coronary artery without angina pectoris: Secondary | ICD-10-CM | POA: Diagnosis not present

## 2014-09-05 DIAGNOSIS — I77810 Thoracic aortic ectasia: Secondary | ICD-10-CM | POA: Diagnosis not present

## 2014-09-05 NOTE — Patient Instructions (Signed)
Stop amlodipine.   Your physician has requested that you regularly monitor and record your blood pressure readings at home. Please use the same machine at the same the same time of day. Call the office with the results in 1 week.   Your physician recommends that you return for a FASTING lipid profile/liver profile in May 2016.  Your physician has requested that you have an echocardiogram. Echocardiography is a painless test that uses sound waves to create images of your heart. It provides your doctor with information about the size and shape of your heart and how well your heart's chambers and valves are working. This procedure takes approximately one hour. There are no restrictions for this procedure. June 20160  Your physician wants you to follow-up in: 6 months with Dr Radford Pax .(October 2016).  You will receive a reminder letter in the mail two months in advance. If you don't receive a letter, please call our office to schedule the follow-up appointment.

## 2014-09-06 ENCOUNTER — Encounter (HOSPITAL_COMMUNITY)
Admission: RE | Admit: 2014-09-06 | Discharge: 2014-09-06 | Disposition: A | Discharge status: Home / Self Care | Payer: BLUE CROSS/BLUE SHIELD | Source: Ambulatory Visit | Attending: Cardiology | Admitting: Cardiology

## 2014-09-06 DIAGNOSIS — Z5189 Encounter for other specified aftercare: Secondary | ICD-10-CM | POA: Diagnosis not present

## 2014-09-09 ENCOUNTER — Encounter (HOSPITAL_COMMUNITY)
Admission: RE | Admit: 2014-09-09 | Discharge: 2014-09-09 | Disposition: A | Discharge status: Home / Self Care | Payer: BLUE CROSS/BLUE SHIELD | Source: Ambulatory Visit | Attending: Cardiology | Admitting: Cardiology

## 2014-09-09 ENCOUNTER — Telehealth: Payer: Self-pay

## 2014-09-09 DIAGNOSIS — R001 Bradycardia, unspecified: Secondary | ICD-10-CM

## 2014-09-09 DIAGNOSIS — Z5189 Encounter for other specified aftercare: Secondary | ICD-10-CM | POA: Diagnosis not present

## 2014-09-09 NOTE — Progress Notes (Signed)
Resting heart rate noted at 44 today at cardiac rehab nonsustained Sinus Loletha Grayer with a rare PAC.Marland Kitchen Patient asymptomatic. Vital signs stable. No complaints voiced with exercise today. Dr Theodosia Blender office called and notified.Spoke with her nurse Valetta Fuller. Blake Carter's heart rate was noted at 47 on 4/07 at Dr Theodosia Blender office. Blake Carter says he has noticed his heart rate as low as 40 at home. Blake Carter is not on a betablocker at this time. Will fax exercise flow sheets to Dr. Theodosia Blender office for review.

## 2014-09-09 NOTE — Telephone Encounter (Signed)
Maria st that while patient was at CR, his resting HR was 44.  She st the patient reports HR going as low as 40 at times.  Verdis Frederickson st that the patient is asymptomatic. BP=118/60. Verdis Frederickson st she is sending over the strip for review.

## 2014-09-09 NOTE — Telephone Encounter (Signed)
Please get a 24 hour Holter to assess HR and also check TSH

## 2014-09-10 NOTE — Telephone Encounter (Signed)
24 holter ordered. TSH ordered to be scheduled the same day as holter appointment. Patient agrees with treatment plan.

## 2014-09-11 ENCOUNTER — Encounter: Payer: Self-pay | Admitting: Cardiology

## 2014-09-11 ENCOUNTER — Encounter (HOSPITAL_COMMUNITY)
Admission: RE | Admit: 2014-09-11 | Discharge: 2014-09-11 | Disposition: A | Discharge status: Home / Self Care | Payer: BLUE CROSS/BLUE SHIELD | Source: Ambulatory Visit | Attending: Cardiology | Admitting: Cardiology

## 2014-09-11 DIAGNOSIS — Z5189 Encounter for other specified aftercare: Secondary | ICD-10-CM | POA: Diagnosis not present

## 2014-09-13 ENCOUNTER — Encounter (HOSPITAL_COMMUNITY)
Admission: RE | Admit: 2014-09-13 | Discharge: 2014-09-13 | Disposition: A | Discharge status: Home / Self Care | Payer: BLUE CROSS/BLUE SHIELD | Source: Ambulatory Visit | Attending: Cardiology | Admitting: Cardiology

## 2014-09-13 ENCOUNTER — Encounter: Payer: Self-pay | Admitting: Radiology

## 2014-09-13 ENCOUNTER — Other Ambulatory Visit (INDEPENDENT_AMBULATORY_CARE_PROVIDER_SITE_OTHER): Payer: BLUE CROSS/BLUE SHIELD | Admitting: *Deleted

## 2014-09-13 ENCOUNTER — Encounter (INDEPENDENT_AMBULATORY_CARE_PROVIDER_SITE_OTHER): Payer: BLUE CROSS/BLUE SHIELD

## 2014-09-13 DIAGNOSIS — R001 Bradycardia, unspecified: Secondary | ICD-10-CM

## 2014-09-13 DIAGNOSIS — Z5189 Encounter for other specified aftercare: Secondary | ICD-10-CM | POA: Diagnosis not present

## 2014-09-13 LAB — TSH: TSH: 5.8 u[IU]/mL — ABNORMAL HIGH (ref 0.35–4.50)

## 2014-09-13 NOTE — Progress Notes (Signed)
Patient ID: Blake Carter, male   DOB: 1951-02-22, 64 y.o.   MRN: [id number] [employer] 24hr holter applied.

## 2014-09-16 ENCOUNTER — Encounter (HOSPITAL_COMMUNITY)
Admission: RE | Admit: 2014-09-16 | Discharge: 2014-09-16 | Disposition: A | Discharge status: Home / Self Care | Payer: BLUE CROSS/BLUE SHIELD | Source: Ambulatory Visit | Attending: Cardiology | Admitting: Cardiology

## 2014-09-16 ENCOUNTER — Telehealth: Payer: Self-pay | Admitting: Cardiology

## 2014-09-16 DIAGNOSIS — Z5189 Encounter for other specified aftercare: Secondary | ICD-10-CM | POA: Diagnosis not present

## 2014-09-16 NOTE — Telephone Encounter (Signed)
Walk-in pt Form " BP readings" dropped off gave to [facility]

## 2014-09-17 ENCOUNTER — Telehealth: Payer: Self-pay

## 2014-09-17 NOTE — Telephone Encounter (Signed)
Per Dr. Radford Pax, asked patient if he has had any dizziness or fatigue. Patient st he gets a little dizzy when he jumps up. Encouraged patient to move a little slower, especially when standing up from sitting or lying.  Patient agrees with treatment plan.

## 2014-09-18 ENCOUNTER — Other Ambulatory Visit: Payer: Self-pay | Admitting: Endocrinology

## 2014-09-18 ENCOUNTER — Encounter (HOSPITAL_COMMUNITY)
Admission: RE | Admit: 2014-09-18 | Discharge: 2014-09-18 | Disposition: A | Discharge status: Home / Self Care | Payer: BLUE CROSS/BLUE SHIELD | Source: Ambulatory Visit | Attending: Cardiology | Admitting: Cardiology

## 2014-09-18 DIAGNOSIS — Z5189 Encounter for other specified aftercare: Secondary | ICD-10-CM | POA: Diagnosis not present

## 2014-09-18 MED ORDER — LEVOTHYROXINE SODIUM 175 MCG PO TABS: 175.0000 ug | ORAL_TABLET | Freq: Every day | ORAL | Status: DC

## 2014-09-18 NOTE — Progress Notes (Signed)
Mr Blake Carter told me his thyroid medication has been increased to 175 mcg once a day.

## 2014-09-20 ENCOUNTER — Encounter (HOSPITAL_COMMUNITY)
Admission: RE | Admit: 2014-09-20 | Discharge: 2014-09-20 | Disposition: A | Discharge status: Home / Self Care | Payer: BLUE CROSS/BLUE SHIELD | Source: Ambulatory Visit | Attending: Cardiology | Admitting: Cardiology

## 2014-09-20 DIAGNOSIS — Z5189 Encounter for other specified aftercare: Secondary | ICD-10-CM | POA: Diagnosis not present

## 2014-09-23 ENCOUNTER — Encounter (HOSPITAL_COMMUNITY)
Admission: RE | Admit: 2014-09-23 | Discharge: 2014-09-23 | Disposition: A | Discharge status: Home / Self Care | Payer: BLUE CROSS/BLUE SHIELD | Source: Ambulatory Visit | Attending: Cardiology | Admitting: Cardiology

## 2014-09-23 DIAGNOSIS — Z5189 Encounter for other specified aftercare: Secondary | ICD-10-CM | POA: Diagnosis not present

## 2014-09-24 ENCOUNTER — Telehealth: Payer: Self-pay | Admitting: Cardiology

## 2014-09-24 NOTE — Telephone Encounter (Signed)
Please let patient know that heart monitor showed sinus bradycardia with an average HR of 50bpm with PAC's and sinus arrhythmia.  Lowest HR was 31bpm during sleep and max rate at 114bpm. Please find out if patient has had any dizziness or fatigue or syncope

## 2014-09-25 ENCOUNTER — Encounter (HOSPITAL_COMMUNITY)
Admission: RE | Admit: 2014-09-25 | Discharge: 2014-09-25 | Disposition: A | Discharge status: Home / Self Care | Payer: BLUE CROSS/BLUE SHIELD | Source: Ambulatory Visit | Attending: Cardiology | Admitting: Cardiology

## 2014-09-25 DIAGNOSIS — Z5189 Encounter for other specified aftercare: Secondary | ICD-10-CM | POA: Diagnosis not present

## 2014-09-26 NOTE — Telephone Encounter (Addendum)
Informed patient of results and verbal understanding expressed.  Patient st he feels much better and his heart rate has increased (it stays in the 50s) since his Synthroid was increased to 175 daily. Med list is updated. Instructed patient to call if he has any questions or concerns.

## 2014-09-27 ENCOUNTER — Encounter (HOSPITAL_COMMUNITY)
Admission: RE | Admit: 2014-09-27 | Discharge: 2014-09-27 | Disposition: A | Discharge status: Home / Self Care | Payer: BLUE CROSS/BLUE SHIELD | Source: Ambulatory Visit | Attending: Cardiology | Admitting: Cardiology

## 2014-09-27 DIAGNOSIS — Z5189 Encounter for other specified aftercare: Secondary | ICD-10-CM | POA: Diagnosis not present

## 2014-09-30 ENCOUNTER — Encounter (HOSPITAL_COMMUNITY)
Admission: RE | Admit: 2014-09-30 | Discharge: 2014-09-30 | Disposition: A | Discharge status: Home / Self Care | Payer: BLUE CROSS/BLUE SHIELD | Source: Ambulatory Visit | Attending: Cardiology | Admitting: Cardiology

## 2014-09-30 DIAGNOSIS — K219 Gastro-esophageal reflux disease without esophagitis: Secondary | ICD-10-CM | POA: Diagnosis not present

## 2014-09-30 DIAGNOSIS — E785 Hyperlipidemia, unspecified: Secondary | ICD-10-CM | POA: Insufficient documentation

## 2014-09-30 DIAGNOSIS — Z79899 Other long term (current) drug therapy: Secondary | ICD-10-CM | POA: Insufficient documentation

## 2014-09-30 DIAGNOSIS — Z5189 Encounter for other specified aftercare: Secondary | ICD-10-CM | POA: Insufficient documentation

## 2014-09-30 DIAGNOSIS — Z8546 Personal history of malignant neoplasm of prostate: Secondary | ICD-10-CM | POA: Diagnosis not present

## 2014-09-30 DIAGNOSIS — F329 Major depressive disorder, single episode, unspecified: Secondary | ICD-10-CM | POA: Diagnosis not present

## 2014-09-30 DIAGNOSIS — R001 Bradycardia, unspecified: Secondary | ICD-10-CM | POA: Diagnosis not present

## 2014-09-30 DIAGNOSIS — I2510 Atherosclerotic heart disease of native coronary artery without angina pectoris: Secondary | ICD-10-CM | POA: Diagnosis not present

## 2014-09-30 DIAGNOSIS — I2584 Coronary atherosclerosis due to calcified coronary lesion: Secondary | ICD-10-CM | POA: Insufficient documentation

## 2014-09-30 DIAGNOSIS — I10 Essential (primary) hypertension: Secondary | ICD-10-CM | POA: Diagnosis not present

## 2014-09-30 DIAGNOSIS — Z955 Presence of coronary angioplasty implant and graft: Secondary | ICD-10-CM | POA: Insufficient documentation

## 2014-09-30 DIAGNOSIS — I77810 Thoracic aortic ectasia: Secondary | ICD-10-CM | POA: Insufficient documentation

## 2014-09-30 DIAGNOSIS — E890 Postprocedural hypothyroidism: Secondary | ICD-10-CM | POA: Insufficient documentation

## 2014-09-30 DIAGNOSIS — Z7982 Long term (current) use of aspirin: Secondary | ICD-10-CM | POA: Diagnosis not present

## 2014-09-30 DIAGNOSIS — Z8249 Family history of ischemic heart disease and other diseases of the circulatory system: Secondary | ICD-10-CM | POA: Insufficient documentation

## 2014-10-02 ENCOUNTER — Encounter (HOSPITAL_COMMUNITY)
Admission: RE | Admit: 2014-10-02 | Discharge: 2014-10-02 | Disposition: A | Discharge status: Home / Self Care | Payer: BLUE CROSS/BLUE SHIELD | Source: Ambulatory Visit | Attending: Cardiology | Admitting: Cardiology

## 2014-10-02 DIAGNOSIS — Z5189 Encounter for other specified aftercare: Secondary | ICD-10-CM | POA: Diagnosis not present

## 2014-10-03 NOTE — Progress Notes (Signed)
Chayson graduates on Friday. Loyed's short term goals have been met to get his strength back and increase  his  Stamina. Viet's long term goals to loose 15-20 lbs have been met. Mckade has lost 6.4kg since starting the program. Jamond says he has lost 30 pounds since he had his stent placement. Donnovan has enjoyed the high intensity training supervised by Alberteen Sam exercise physiologist. PHQ=0.  We are proud of Arnol's progress and weight loss. Treshaun plans to continue exercise in the maintenance program.

## 2014-10-04 ENCOUNTER — Encounter (HOSPITAL_COMMUNITY)
Admission: RE | Admit: 2014-10-04 | Discharge: 2014-10-04 | Disposition: A | Discharge status: Home / Self Care | Payer: BLUE CROSS/BLUE SHIELD | Source: Ambulatory Visit | Attending: Cardiology | Admitting: Cardiology

## 2014-10-04 DIAGNOSIS — Z5189 Encounter for other specified aftercare: Secondary | ICD-10-CM | POA: Diagnosis not present

## 2014-10-07 ENCOUNTER — Encounter (HOSPITAL_COMMUNITY): Payer: BLUE CROSS/BLUE SHIELD

## 2014-10-07 ENCOUNTER — Encounter (HOSPITAL_COMMUNITY)
Admission: RE | Admit: 2014-10-07 | Discharge: 2014-10-07 | Disposition: A | Discharge status: Home / Self Care | Payer: Self-pay | Source: Ambulatory Visit | Attending: Cardiology | Admitting: Cardiology

## 2014-10-07 ENCOUNTER — Other Ambulatory Visit (INDEPENDENT_AMBULATORY_CARE_PROVIDER_SITE_OTHER): Payer: BLUE CROSS/BLUE SHIELD | Admitting: *Deleted

## 2014-10-07 DIAGNOSIS — E785 Hyperlipidemia, unspecified: Secondary | ICD-10-CM

## 2014-10-07 DIAGNOSIS — Z9861 Coronary angioplasty status: Secondary | ICD-10-CM | POA: Insufficient documentation

## 2014-10-07 DIAGNOSIS — I10 Essential (primary) hypertension: Secondary | ICD-10-CM | POA: Diagnosis not present

## 2014-10-07 DIAGNOSIS — I2510 Atherosclerotic heart disease of native coronary artery without angina pectoris: Secondary | ICD-10-CM

## 2014-10-07 DIAGNOSIS — I77810 Thoracic aortic ectasia: Secondary | ICD-10-CM

## 2014-10-07 DIAGNOSIS — Z48812 Encounter for surgical aftercare following surgery on the circulatory system: Secondary | ICD-10-CM | POA: Insufficient documentation

## 2014-10-07 DIAGNOSIS — R001 Bradycardia, unspecified: Secondary | ICD-10-CM

## 2014-10-07 LAB — HEPATIC FUNCTION PANEL
ALT: 15 U/L (ref 0–53)
AST: 14 U/L (ref 0–37)
Albumin: 4.1 g/dL (ref 3.5–5.2)
Alkaline Phosphatase: 69 U/L (ref 39–117)
Bilirubin, Direct: 0 mg/dL (ref 0.0–0.3)
Total Bilirubin: 0.4 mg/dL (ref 0.2–1.2)
Total Protein: 7 g/dL (ref 6.0–8.3)

## 2014-10-07 LAB — LIPID PANEL
Cholesterol: 114 mg/dL (ref 0–200)
HDL: 28.9 mg/dL — ABNORMAL LOW (ref >39.00)
LDL Cholesterol: 52 mg/dL (ref 0–99)
NonHDL: 85.1
Total CHOL/HDL Ratio: 4
Triglycerides: 167 mg/dL — ABNORMAL HIGH (ref 0.0–149.0)
VLDL: 33.4 mg/dL (ref 0.0–40.0)

## 2014-10-07 NOTE — Addendum Note (Signed)
Addended by: Eulis Foster on: 10/07/2014 09:18 AM   Modules accepted: Orders

## 2014-10-09 ENCOUNTER — Encounter (HOSPITAL_COMMUNITY)
Admission: RE | Admit: 2014-10-09 | Discharge: 2014-10-09 | Disposition: A | Discharge status: Home / Self Care | Payer: Self-pay | Source: Ambulatory Visit | Attending: Cardiology | Admitting: Cardiology

## 2014-10-09 ENCOUNTER — Encounter (HOSPITAL_COMMUNITY): Payer: BLUE CROSS/BLUE SHIELD

## 2014-10-10 ENCOUNTER — Encounter: Payer: Self-pay | Admitting: Cardiology

## 2014-10-11 ENCOUNTER — Encounter (HOSPITAL_COMMUNITY): Payer: BLUE CROSS/BLUE SHIELD

## 2014-10-11 ENCOUNTER — Encounter (HOSPITAL_COMMUNITY)
Admission: RE | Admit: 2014-10-11 | Discharge: 2014-10-11 | Disposition: A | Discharge status: Home / Self Care | Payer: Self-pay | Source: Ambulatory Visit | Attending: Cardiology | Admitting: Cardiology

## 2014-10-14 ENCOUNTER — Encounter (HOSPITAL_COMMUNITY)
Admission: RE | Admit: 2014-10-14 | Discharge: 2014-10-14 | Disposition: A | Discharge status: Home / Self Care | Payer: Self-pay | Source: Ambulatory Visit | Attending: Cardiology | Admitting: Cardiology

## 2014-10-16 ENCOUNTER — Encounter (HOSPITAL_COMMUNITY)
Admission: RE | Admit: 2014-10-16 | Discharge: 2014-10-16 | Disposition: A | Discharge status: Home / Self Care | Payer: Self-pay | Source: Ambulatory Visit | Attending: Cardiology | Admitting: Cardiology

## 2014-10-18 ENCOUNTER — Encounter (HOSPITAL_COMMUNITY)
Admission: RE | Admit: 2014-10-18 | Discharge: 2014-10-18 | Disposition: A | Discharge status: Home / Self Care | Payer: Self-pay | Source: Ambulatory Visit | Attending: Cardiology | Admitting: Cardiology

## 2014-10-21 ENCOUNTER — Encounter (HOSPITAL_COMMUNITY)
Admission: RE | Admit: 2014-10-21 | Discharge: 2014-10-21 | Disposition: A | Discharge status: Home / Self Care | Payer: Self-pay | Source: Ambulatory Visit | Attending: Cardiology | Admitting: Cardiology

## 2014-10-23 ENCOUNTER — Encounter (HOSPITAL_COMMUNITY)
Admission: RE | Admit: 2014-10-23 | Discharge: 2014-10-23 | Disposition: A | Discharge status: Home / Self Care | Payer: Self-pay | Source: Ambulatory Visit | Attending: Cardiology | Admitting: Cardiology

## 2014-10-25 ENCOUNTER — Encounter (HOSPITAL_COMMUNITY)
Admission: RE | Admit: 2014-10-25 | Discharge: 2014-10-25 | Disposition: A | Discharge status: Home / Self Care | Payer: Self-pay | Source: Ambulatory Visit | Attending: Cardiology | Admitting: Cardiology

## 2014-10-29 ENCOUNTER — Telehealth: Payer: Self-pay | Admitting: Cardiology

## 2014-10-29 NOTE — Telephone Encounter (Signed)
Left message to call back  

## 2014-10-29 NOTE — Telephone Encounter (Signed)
New Message  Pt requested to speak w/ Blake Carter about medication. Please call back and discuss.

## 2014-10-30 ENCOUNTER — Encounter (HOSPITAL_COMMUNITY)
Admission: RE | Admit: 2014-10-30 | Discharge: 2014-10-30 | Disposition: A | Discharge status: Home / Self Care | Payer: Self-pay | Source: Ambulatory Visit | Attending: Cardiology | Admitting: Cardiology

## 2014-10-30 ENCOUNTER — Ambulatory Visit: Payer: BLUE CROSS/BLUE SHIELD | Admitting: Endocrinology

## 2014-10-30 ENCOUNTER — Encounter: Payer: Self-pay | Admitting: Endocrinology

## 2014-10-30 ENCOUNTER — Ambulatory Visit (INDEPENDENT_AMBULATORY_CARE_PROVIDER_SITE_OTHER): Payer: BLUE CROSS/BLUE SHIELD | Admitting: Endocrinology

## 2014-10-30 VITALS — BP 128/80 | HR 57 | Temp 97.5°F | Ht 71.0 in | Wt 247.0 lb

## 2014-10-30 DIAGNOSIS — Z48812 Encounter for surgical aftercare following surgery on the circulatory system: Secondary | ICD-10-CM | POA: Insufficient documentation

## 2014-10-30 DIAGNOSIS — E890 Postprocedural hypothyroidism: Secondary | ICD-10-CM | POA: Diagnosis not present

## 2014-10-30 DIAGNOSIS — Z9861 Coronary angioplasty status: Secondary | ICD-10-CM | POA: Insufficient documentation

## 2014-10-30 LAB — TSH: TSH: 1 u[IU]/mL (ref 0.35–4.50)

## 2014-10-30 MED ORDER — LISINOPRIL 10 MG PO TABS: 10.0000 mg | ORAL_TABLET | Freq: Every morning | ORAL | Status: DC

## 2014-10-30 NOTE — Telephone Encounter (Signed)
Instructed patient to DECREASE LISINOPRIL to 10 mg daily and to check BP and HR for a week and call with results. Patient agrees with treatment plan.

## 2014-10-30 NOTE — Patient Instructions (Addendum)
blood tests are requested for you today.  We'll let you know about the results. I would be happy to see you back here whenever you want.    Dr Rex Kras would be happy to check your thyroid.

## 2014-10-30 NOTE — Telephone Encounter (Signed)
F/u ° ° °Pt returning your call from yesterday. °

## 2014-10-30 NOTE — Telephone Encounter (Signed)
Patient c/o intermittent dizziness for a few weeks. He felt better briefly after his thyroid medication was changed at the end of April, but the dizziness has returned. He is concerned his BP is low. After rehab the other day, his BP dropped to 90/44.  Yesterday, he "just felt terrible." He could not really expound on his symptoms, but he decided he was dehydrated, drank two gallons of water and now feels better. This AM his BP was 112/70. He st his HR has been in the 50's. He is going to his endocrinologist this afternoon to see if his thyroid meds need adjusting, but he is concerned his lisinopril dose is too high.

## 2014-10-30 NOTE — Progress Notes (Signed)
Subjective:    Patient ID: Blake Carter, male    DOB: Jul 23, 1950, 64 y.o.   MRN: [id number]  HPI Pt returns for f/u of post-I-131 hypothyroidism (he had XRT for enlarged adenoids as a child; he had resection of his thyroid isthmus in 1976, for a "cold" nodule (benign); in July of 2014, pt had i-131 rx for hyperthyroidism due to grave's dz; in September of 2014, he was noted to have elevated TSH, so synthroid was rx'ed; synthroid dosage has been as high as 200 mcg/day).   pt states he feels well in general, except for cold intolerance. Past Medical History  Diagnosis Date  . Hypertension   . Hyperlipidemia     under control  . Environmental allergies     dry cough  . OCD (obsessive compulsive disorder)   . SUI (stress urinary incontinence), male     S/P ROBOTIC PROSTATECTOMY AND RADIATION TX  . Hypothyroidism   . Depression   . Panic disorder   . GERD (gastroesophageal reflux disease)   . Allergic rhinitis   . ED (erectile dysfunction)   . PONV (postoperative nausea and vomiting)     after thyroid surgery no problems in 10 years   . Sinus bradycardia     usually in 50's  . History of hiatal hernia   . Coronary artery disease     a. 05/2014 Cath/PCI: LM nl, LAD 40p, 60-70d (FFR 0.80->3.0x24 Synergy DES), D2 40, LCX 90d (2.25x12 Synergy DES), RCA 20-58m, EF 60-65%.  . Arthritis     "a little bit in some of my joints" (06/20/2014)  . Hydronephrosis of right kidney     a. s/p ureteral stenting in the past.  . Prostate cancer     prostate cancer- robotic prostatectomy - followed by radiation because of positive lymph node--and pt on lupron injections    Past Surgical History  Procedure Laterality Date  . Robot assisted laparoscopic radical prostatectomy  05/2010  . Thyroidectomy, partial  10/1974  . Cystoscopy  11/30/2011    Procedure: CYSTOSCOPY;  Surgeon: Reece Packer, MD;  Location: [facility];  Service: Urology;  Laterality: N/A;  Inplantation of Artificial Sphincter and  Cystoscopy  . Urinary sphincter implant  11/30/2011    Procedure: ARTIFICIAL URINARY SPHINCTER;  Surgeon: Reece Packer, MD;  Location: [facility];  Service: Urology;  Laterality: N/A;  . Total thyroidectomy  10/2012    "nuked it"  . Upper gi endoscopy      food impaction 2009 done by Dr Oletta Lamas  . Refractive surgery Bilateral 2004  . Tonsillectomy  1956   . Colonoscopy  07/2013    Dr. Oletta Lamas  . Cystoscopy with retrograde pyelogram, ureteroscopy and stent placement Right 02/11/2014    Procedure: CYSTOSCOPY WITH RETROGRADE PYELOGRAM, URETEROSCOPY ,URETERAL BIOPSY AND STENT PLACEMENT;  Surgeon: Raynelle Bring, MD;  Location: [facility];  Service: Urology;  Laterality: Right;  . Cystoscopy with retrograde pyelogram, ureteroscopy and stent placement Right 04/15/2014    Procedure: [place], URETEROSCOPY, BIOPSY AND STENT PLACEMENT;  Surgeon: Raynelle Bring, MD;  Location: [facility];  Service: Urology;  Laterality: Right;  . Coronary angioplasty with stent placement  06/20/2014    "2"  . Left heart catheterization with coronary angiogram N/A 06/20/2014    Procedure: LEFT HEART CATHETERIZATION WITH CORONARY ANGIOGRAM;  Surgeon: Leonie Man, MD;  Location: [facility] CATH LAB;  Service: Cardiovascular;  Laterality: N/A;  . Percutaneous coronary stent intervention (pci-s)  06/20/2014    Procedure: PERCUTANEOUS  CORONARY STENT INTERVENTION (PCI-S);  Surgeon: Leonie Man, MD;  Location: [facility] CATH LAB;  Service: Cardiovascular;;  LAD and Circumflex    History   Social History  . Marital Status: Married    Spouse Name: N/A  . Number of Children: N/A  . Years of Education: N/A   Occupational History  . Not on file.   Social History Main Topics  . Smoking status: Never Smoker   . Smokeless tobacco: Never Used  . Alcohol Use: 0.0 oz/week    0 Standard drinks or equivalent per week     Comment: 06/20/2014 "1-2 drinks a couple times/month"  . Drug Use: No  . Sexual Activity: Yes    Other Topics Concern  . Not on file   Social History Narrative    Current Outpatient Prescriptions on File Prior to Visit  Medication Sig Dispense Refill  . aspirin EC 81 MG tablet Take 81 mg by mouth daily.    Marland Kitchen atorvastatin (LIPITOR) 40 MG tablet Take 1 tablet (40 mg total) by mouth every morning. 90 tablet 3  . clopidogrel (PLAVIX) 75 MG tablet Take 1 tablet (75 mg total) by mouth daily with breakfast. 30 tablet 1  . fluvoxaMINE (LUVOX) 50 MG tablet Take 150 mg by mouth every morning.     Marland Kitchen gemfibrozil (LOPID) 600 MG tablet Take 600 mg by mouth 2 (two) times daily.     Marland Kitchen levothyroxine (SYNTHROID, LEVOTHROID) 175 MCG tablet Take 1 tablet (175 mcg total) by mouth daily before breakfast. 90 tablet 0  . lisinopril (PRINIVIL,ZESTRIL) 20 MG tablet Take 20 mg by mouth every morning.     . nitroGLYCERIN (NITROSTAT) 0.4 MG SL tablet Place 1 tablet (0.4 mg total) under the tongue every 5 (five) minutes as needed for chest pain. 25 tablet 3  . pantoprazole (PROTONIX) 40 MG tablet Take 2 tablets (80 mg total) by mouth daily. 180 tablet 3  . SIMBRINZA 1-0.2 % SUSP Place 1 drop into both eyes 2 (two) times daily.    Marland Kitchen zolpidem (AMBIEN) 10 MG tablet Take 10 mg by mouth at bedtime as needed for sleep.     . diazepam (VALIUM) 5 MG tablet Take 5 mg by mouth every 6 (six) hours as needed for anxiety.    Marland Kitchen HYDROcodone-acetaminophen (NORCO/VICODIN) 5-325 MG per tablet Take 1-2 tablets by mouth every 6 (six) hours as needed. (Patient not taking: Reported on 10/30/2014) 25 tablet 0   No current facility-administered medications on file prior to visit.    Allergies  Allergen Reactions  . Niacin And Related Other (See Comments)    Flushing   . Tagamet [Cimetidine] Other (See Comments)    unknown    Family History  Problem Relation Age of Onset  . Cancer Father   . Hyperlipidemia Father   . Heart disease Father     Father had CABG/aorta repair by the time he was in his 70s  . CAD Brother      Brother who is 104 years younger has had some sort of stenting    BP 128/80 mmHg  Pulse 57  Temp(Src) 97.5 F (36.4 C) (Oral)  Ht 5\' 11"  (1.803 m)  Wt 247 lb (112.038 kg)  BMI 34.46 kg/m2  SpO2 98%    Review of Systems He has lost weight, due to his efforts.      Objective:   Physical Exam VITAL SIGNS:  See vs page GENERAL: no distress Skin: not diaphoretic Neuro: no tremor  Lab Results  Component Value Date   TSH 1.00 10/30/2014       Assessment & Plan:  Post-I-131 hypothyroidism: well-replaced.  Patient is advised the following: Patient Instructions  blood tests are requested for you today.  We'll let you know about the results. I would be happy to see you back here whenever you want.    Dr Rex Kras would be happy to check your thyroid.

## 2014-10-30 NOTE — Telephone Encounter (Signed)
Decrease lisinopril to 10mg  daily and check BP and HRfor a week and call with results

## 2014-11-01 ENCOUNTER — Encounter (HOSPITAL_COMMUNITY)
Admission: RE | Admit: 2014-11-01 | Discharge: 2014-11-01 | Disposition: A | Discharge status: Home / Self Care | Payer: Self-pay | Source: Ambulatory Visit | Attending: Cardiology | Admitting: Cardiology

## 2014-11-04 ENCOUNTER — Other Ambulatory Visit: Payer: Self-pay

## 2014-11-04 ENCOUNTER — Ambulatory Visit (HOSPITAL_COMMUNITY): Payer: BLUE CROSS/BLUE SHIELD | Attending: Cardiovascular Disease

## 2014-11-04 ENCOUNTER — Encounter (HOSPITAL_COMMUNITY)
Admission: RE | Admit: 2014-11-04 | Discharge: 2014-11-04 | Disposition: A | Discharge status: Home / Self Care | Payer: Self-pay | Source: Ambulatory Visit | Attending: Cardiology | Admitting: Cardiology

## 2014-11-04 ENCOUNTER — Other Ambulatory Visit (HOSPITAL_COMMUNITY): Payer: BLUE CROSS/BLUE SHIELD

## 2014-11-04 DIAGNOSIS — I517 Cardiomegaly: Secondary | ICD-10-CM | POA: Diagnosis not present

## 2014-11-04 DIAGNOSIS — I77810 Thoracic aortic ectasia: Secondary | ICD-10-CM

## 2014-11-04 DIAGNOSIS — I351 Nonrheumatic aortic (valve) insufficiency: Secondary | ICD-10-CM | POA: Insufficient documentation

## 2014-11-06 ENCOUNTER — Telehealth: Payer: Self-pay

## 2014-11-06 ENCOUNTER — Encounter (HOSPITAL_COMMUNITY)
Admission: RE | Admit: 2014-11-06 | Discharge: 2014-11-06 | Disposition: A | Discharge status: Home / Self Care | Payer: Self-pay | Source: Ambulatory Visit | Attending: Cardiology | Admitting: Cardiology

## 2014-11-06 DIAGNOSIS — I77810 Thoracic aortic ectasia: Secondary | ICD-10-CM

## 2014-11-06 NOTE — Telephone Encounter (Signed)
Informed patient of results and verbal understanding expressed.   Repeat ECHO ordered for scheduling in 1 year. Patient agrees with treatment plan. 

## 2014-11-06 NOTE — Telephone Encounter (Signed)
-----   Message from Sueanne Margarita, MD sent at 11/05/2014  8:45 PM EDT ----- Please let patient know that echo showed normal LVF with mildly thickened heart muscle, mildly dilated aortic root - repeat echo in 1 year to recheck dilated aortic root

## 2014-11-08 ENCOUNTER — Encounter (HOSPITAL_COMMUNITY): Payer: Self-pay

## 2014-11-11 ENCOUNTER — Encounter (HOSPITAL_COMMUNITY)
Admission: RE | Admit: 2014-11-11 | Discharge: 2014-11-11 | Disposition: A | Discharge status: Home / Self Care | Payer: Self-pay | Source: Ambulatory Visit | Attending: Cardiology | Admitting: Cardiology

## 2014-11-13 ENCOUNTER — Encounter (HOSPITAL_COMMUNITY)
Admission: RE | Admit: 2014-11-13 | Discharge: 2014-11-13 | Disposition: A | Discharge status: Home / Self Care | Payer: Self-pay | Source: Ambulatory Visit | Attending: Cardiology | Admitting: Cardiology

## 2014-11-15 ENCOUNTER — Encounter (HOSPITAL_COMMUNITY)
Admission: RE | Admit: 2014-11-15 | Discharge: 2014-11-15 | Disposition: A | Discharge status: Home / Self Care | Payer: Self-pay | Source: Ambulatory Visit | Attending: Cardiology | Admitting: Cardiology

## 2014-11-18 ENCOUNTER — Encounter (HOSPITAL_COMMUNITY)
Admission: RE | Admit: 2014-11-18 | Discharge: 2014-11-18 | Disposition: A | Discharge status: Home / Self Care | Payer: Self-pay | Source: Ambulatory Visit | Attending: Cardiology | Admitting: Cardiology

## 2014-11-19 ENCOUNTER — Telehealth: Payer: Self-pay | Admitting: Cardiology

## 2014-11-19 NOTE — Telephone Encounter (Signed)
MyChart message sent to patient to continue current medications. Instructed patient to call if he has any questions or concerns.

## 2014-11-19 NOTE — Telephone Encounter (Signed)
6/21 Received patients BP readings gave to [facility].  [facility]

## 2014-11-19 NOTE — Telephone Encounter (Signed)
Patient brought in BP readings which range from 112-125/56-44mmHg.  Continue current meds

## 2014-11-20 ENCOUNTER — Encounter (HOSPITAL_COMMUNITY)
Admission: RE | Admit: 2014-11-20 | Discharge: 2014-11-20 | Disposition: A | Discharge status: Home / Self Care | Payer: Self-pay | Source: Ambulatory Visit | Attending: Cardiology | Admitting: Cardiology

## 2014-11-22 ENCOUNTER — Encounter (HOSPITAL_COMMUNITY)
Admission: RE | Admit: 2014-11-22 | Discharge: 2014-11-22 | Disposition: A | Discharge status: Home / Self Care | Payer: Self-pay | Source: Ambulatory Visit | Attending: Cardiology | Admitting: Cardiology

## 2014-11-25 ENCOUNTER — Encounter (HOSPITAL_COMMUNITY)
Admission: RE | Admit: 2014-11-25 | Discharge: 2014-11-25 | Disposition: A | Discharge status: Home / Self Care | Payer: Self-pay | Source: Ambulatory Visit | Attending: Cardiology | Admitting: Cardiology

## 2014-11-27 ENCOUNTER — Encounter (HOSPITAL_COMMUNITY)
Admission: RE | Admit: 2014-11-27 | Discharge: 2014-11-27 | Disposition: A | Discharge status: Home / Self Care | Payer: Self-pay | Source: Ambulatory Visit | Attending: Cardiology | Admitting: Cardiology

## 2014-11-29 ENCOUNTER — Encounter (HOSPITAL_COMMUNITY): Payer: BLUE CROSS/BLUE SHIELD

## 2014-11-29 DIAGNOSIS — Z9861 Coronary angioplasty status: Secondary | ICD-10-CM | POA: Insufficient documentation

## 2014-11-29 DIAGNOSIS — Z48812 Encounter for surgical aftercare following surgery on the circulatory system: Secondary | ICD-10-CM | POA: Insufficient documentation

## 2014-12-02 ENCOUNTER — Other Ambulatory Visit: Payer: Self-pay | Admitting: Endocrinology

## 2014-12-04 ENCOUNTER — Encounter (HOSPITAL_COMMUNITY): Payer: Self-pay

## 2014-12-06 ENCOUNTER — Encounter (HOSPITAL_COMMUNITY)
Admission: RE | Admit: 2014-12-06 | Discharge: 2014-12-06 | Disposition: A | Discharge status: Home / Self Care | Payer: Self-pay | Source: Ambulatory Visit | Attending: Cardiology | Admitting: Cardiology

## 2014-12-09 ENCOUNTER — Encounter (HOSPITAL_COMMUNITY)
Admission: RE | Admit: 2014-12-09 | Discharge: 2014-12-09 | Disposition: A | Discharge status: Home / Self Care | Payer: Self-pay | Source: Ambulatory Visit | Attending: Cardiology | Admitting: Cardiology

## 2014-12-11 ENCOUNTER — Encounter (HOSPITAL_COMMUNITY): Payer: Self-pay

## 2014-12-13 ENCOUNTER — Encounter (HOSPITAL_COMMUNITY): Payer: Self-pay

## 2014-12-16 ENCOUNTER — Encounter (HOSPITAL_COMMUNITY): Payer: Self-pay

## 2014-12-18 ENCOUNTER — Encounter (HOSPITAL_COMMUNITY): Payer: Self-pay

## 2014-12-20 ENCOUNTER — Encounter (HOSPITAL_COMMUNITY): Payer: Self-pay

## 2014-12-23 ENCOUNTER — Encounter (HOSPITAL_COMMUNITY): Payer: Self-pay

## 2014-12-25 ENCOUNTER — Encounter (HOSPITAL_COMMUNITY): Payer: Self-pay

## 2014-12-27 ENCOUNTER — Encounter (HOSPITAL_COMMUNITY): Payer: Self-pay

## 2014-12-30 ENCOUNTER — Encounter (HOSPITAL_COMMUNITY)
Admission: RE | Admit: 2014-12-30 | Discharge: 2014-12-30 | Disposition: A | Discharge status: Home / Self Care | Payer: Self-pay | Source: Ambulatory Visit | Attending: Cardiology | Admitting: Cardiology

## 2014-12-30 DIAGNOSIS — Z48812 Encounter for surgical aftercare following surgery on the circulatory system: Secondary | ICD-10-CM | POA: Insufficient documentation

## 2014-12-30 DIAGNOSIS — Z9861 Coronary angioplasty status: Secondary | ICD-10-CM | POA: Insufficient documentation

## 2015-01-01 ENCOUNTER — Encounter (HOSPITAL_COMMUNITY)
Admission: RE | Admit: 2015-01-01 | Discharge: 2015-01-01 | Disposition: A | Discharge status: Home / Self Care | Payer: Self-pay | Source: Ambulatory Visit | Attending: Cardiology | Admitting: Cardiology

## 2015-01-01 ENCOUNTER — Encounter: Payer: Self-pay | Admitting: Cardiology

## 2015-01-03 ENCOUNTER — Encounter (HOSPITAL_COMMUNITY)
Admission: RE | Admit: 2015-01-03 | Discharge: 2015-01-03 | Disposition: A | Discharge status: Home / Self Care | Payer: Self-pay | Source: Ambulatory Visit | Attending: Cardiology | Admitting: Cardiology

## 2015-01-06 ENCOUNTER — Encounter (HOSPITAL_COMMUNITY)
Admission: RE | Admit: 2015-01-06 | Discharge: 2015-01-06 | Disposition: A | Discharge status: Home / Self Care | Payer: Self-pay | Source: Ambulatory Visit | Attending: Cardiology | Admitting: Cardiology

## 2015-01-08 ENCOUNTER — Ambulatory Visit (INDEPENDENT_AMBULATORY_CARE_PROVIDER_SITE_OTHER): Payer: BLUE CROSS/BLUE SHIELD | Admitting: Podiatry

## 2015-01-08 ENCOUNTER — Ambulatory Visit (INDEPENDENT_AMBULATORY_CARE_PROVIDER_SITE_OTHER): Payer: BLUE CROSS/BLUE SHIELD

## 2015-01-08 ENCOUNTER — Encounter (HOSPITAL_COMMUNITY)
Admission: RE | Admit: 2015-01-08 | Discharge: 2015-01-08 | Disposition: A | Discharge status: Home / Self Care | Payer: Self-pay | Source: Ambulatory Visit | Attending: Cardiology | Admitting: Cardiology

## 2015-01-08 DIAGNOSIS — M79671 Pain in right foot: Secondary | ICD-10-CM | POA: Diagnosis not present

## 2015-01-08 DIAGNOSIS — M205X1 Other deformities of toe(s) (acquired), right foot: Secondary | ICD-10-CM

## 2015-01-08 DIAGNOSIS — M2012 Hallux valgus (acquired), left foot: Secondary | ICD-10-CM

## 2015-01-08 DIAGNOSIS — M79672 Pain in left foot: Secondary | ICD-10-CM | POA: Diagnosis not present

## 2015-01-08 DIAGNOSIS — M779 Enthesopathy, unspecified: Secondary | ICD-10-CM | POA: Diagnosis not present

## 2015-01-08 DIAGNOSIS — M21612 Bunion of left foot: Secondary | ICD-10-CM

## 2015-01-08 NOTE — Progress Notes (Signed)
Subjective:     Patient ID: Blake Carter, male   DOB: 03-20-1951, 64 y.o.   MRN: [id number]  HPI patient states I'm getting pain in both my feet and I have a low arches which can bother me and pain is present with walking. I've had structural bunion deformity and I have a bad big toe joint on my right foot   Review of Systems  All other systems reviewed and are negative.      Objective:   Physical Exam  Constitutional: He is oriented to person, place, and time.  Cardiovascular: Intact distal pulses.   Musculoskeletal: Normal range of motion.  Neurological: He is oriented to person, place, and time.  Skin: Skin is warm.  Nursing note and vitals reviewed.  neurovascular status intact with muscle strength adequate range of motion within normal limits. Patient's noted to have equinus condition bilateral low arch foot type and is found to have structural hallux limitus deformity right and structural bunion deformity left. Patient has pain in his feet in general and does have significant structural malalignment     Assessment:     Long-term bad foot structure with structural bunion left and hallux limitus deformity right along with pain    Plan:     H&P and conditions reviewed with patient. At this time I have recommended orthotics and patient is scanned for custom orthotic devices to try to reduce stress against his feet

## 2015-01-08 NOTE — Progress Notes (Signed)
   Subjective:    Patient ID: Blake Carter, male    DOB: 05/15/1951, 64 y.o.   MRN: [id number]  HPI Pt presents c/o bilateral shin pain, requesting new orthotics, his current pair are 63 years old   Review of Systems  All other systems reviewed and are negative.      Objective:   Physical Exam        Assessment & Plan:

## 2015-01-10 ENCOUNTER — Encounter (HOSPITAL_COMMUNITY)
Admission: RE | Admit: 2015-01-10 | Discharge: 2015-01-10 | Disposition: A | Discharge status: Home / Self Care | Payer: Self-pay | Source: Ambulatory Visit | Attending: Cardiology | Admitting: Cardiology

## 2015-01-13 ENCOUNTER — Encounter (HOSPITAL_COMMUNITY)
Admission: RE | Admit: 2015-01-13 | Discharge: 2015-01-13 | Disposition: A | Discharge status: Home / Self Care | Payer: Self-pay | Source: Ambulatory Visit | Attending: Cardiology | Admitting: Cardiology

## 2015-01-15 ENCOUNTER — Encounter (HOSPITAL_COMMUNITY)
Admission: RE | Admit: 2015-01-15 | Discharge: 2015-01-15 | Disposition: A | Discharge status: Home / Self Care | Payer: Self-pay | Source: Ambulatory Visit | Attending: Cardiology | Admitting: Cardiology

## 2015-01-17 ENCOUNTER — Encounter (HOSPITAL_COMMUNITY)
Admission: RE | Admit: 2015-01-17 | Discharge: 2015-01-17 | Disposition: A | Discharge status: Home / Self Care | Payer: Self-pay | Source: Ambulatory Visit | Attending: Cardiology | Admitting: Cardiology

## 2015-01-20 ENCOUNTER — Encounter (HOSPITAL_COMMUNITY)
Admission: RE | Admit: 2015-01-20 | Discharge: 2015-01-20 | Disposition: A | Discharge status: Home / Self Care | Payer: Self-pay | Source: Ambulatory Visit | Attending: Cardiology | Admitting: Cardiology

## 2015-01-22 ENCOUNTER — Encounter (HOSPITAL_COMMUNITY)
Admission: RE | Admit: 2015-01-22 | Discharge: 2015-01-22 | Disposition: A | Discharge status: Home / Self Care | Payer: Self-pay | Source: Ambulatory Visit | Attending: Cardiology | Admitting: Cardiology

## 2015-01-24 ENCOUNTER — Encounter (HOSPITAL_COMMUNITY)
Admission: RE | Admit: 2015-01-24 | Discharge: 2015-01-24 | Disposition: A | Discharge status: Home / Self Care | Payer: Self-pay | Source: Ambulatory Visit | Attending: Cardiology | Admitting: Cardiology

## 2015-01-27 ENCOUNTER — Encounter (HOSPITAL_COMMUNITY)
Admission: RE | Admit: 2015-01-27 | Discharge: 2015-01-27 | Disposition: A | Discharge status: Home / Self Care | Payer: Self-pay | Source: Ambulatory Visit | Attending: Cardiology | Admitting: Cardiology

## 2015-01-29 ENCOUNTER — Encounter (HOSPITAL_COMMUNITY)
Admission: RE | Admit: 2015-01-29 | Discharge: 2015-01-29 | Disposition: A | Discharge status: Home / Self Care | Payer: Self-pay | Source: Ambulatory Visit | Attending: Cardiology | Admitting: Cardiology

## 2015-01-30 ENCOUNTER — Encounter: Payer: Self-pay | Admitting: Cardiology

## 2015-01-31 ENCOUNTER — Encounter (HOSPITAL_COMMUNITY): Payer: BLUE CROSS/BLUE SHIELD

## 2015-01-31 DIAGNOSIS — Z48812 Encounter for surgical aftercare following surgery on the circulatory system: Secondary | ICD-10-CM | POA: Insufficient documentation

## 2015-01-31 DIAGNOSIS — Z9861 Coronary angioplasty status: Secondary | ICD-10-CM | POA: Insufficient documentation

## 2015-02-05 ENCOUNTER — Encounter (HOSPITAL_COMMUNITY)
Admission: RE | Admit: 2015-02-05 | Discharge: 2015-02-05 | Disposition: A | Discharge status: Home / Self Care | Payer: Self-pay | Source: Ambulatory Visit | Attending: Cardiology | Admitting: Cardiology

## 2015-02-07 ENCOUNTER — Encounter (HOSPITAL_COMMUNITY)
Admission: RE | Admit: 2015-02-07 | Discharge: 2015-02-07 | Disposition: A | Discharge status: Home / Self Care | Payer: Self-pay | Source: Ambulatory Visit | Attending: Cardiology | Admitting: Cardiology

## 2015-02-07 ENCOUNTER — Ambulatory Visit: Payer: BLUE CROSS/BLUE SHIELD | Admitting: *Deleted

## 2015-02-07 DIAGNOSIS — M779 Enthesopathy, unspecified: Secondary | ICD-10-CM

## 2015-02-07 NOTE — Progress Notes (Signed)
Patient ID: Blake Carter, male   DOB: 1950/11/30, 64 y.o.   MRN: [id number] Patient presents for orthotic pick up.  Verbal and written break in and wear instructions given.  Patient will follow up in 4 weeks if symptoms worsen or fail to improve.

## 2015-02-07 NOTE — Patient Instructions (Signed)

## 2015-02-10 ENCOUNTER — Encounter (HOSPITAL_COMMUNITY)
Admission: RE | Admit: 2015-02-10 | Discharge: 2015-02-10 | Disposition: A | Discharge status: Home / Self Care | Payer: Self-pay | Source: Ambulatory Visit | Attending: Cardiology | Admitting: Cardiology

## 2015-02-12 ENCOUNTER — Encounter (HOSPITAL_COMMUNITY)
Admission: RE | Admit: 2015-02-12 | Discharge: 2015-02-12 | Disposition: A | Discharge status: Home / Self Care | Payer: Self-pay | Source: Ambulatory Visit | Attending: Cardiology | Admitting: Cardiology

## 2015-02-14 ENCOUNTER — Encounter (HOSPITAL_COMMUNITY): Payer: Self-pay

## 2015-02-16 ENCOUNTER — Other Ambulatory Visit: Payer: Self-pay | Admitting: Cardiology

## 2015-02-17 ENCOUNTER — Encounter (HOSPITAL_COMMUNITY): Payer: Self-pay

## 2015-02-19 ENCOUNTER — Encounter (HOSPITAL_COMMUNITY)
Admission: RE | Admit: 2015-02-19 | Discharge: 2015-02-19 | Disposition: A | Discharge status: Home / Self Care | Payer: Self-pay | Source: Ambulatory Visit | Attending: Cardiology | Admitting: Cardiology

## 2015-02-21 ENCOUNTER — Encounter (HOSPITAL_COMMUNITY)
Admission: RE | Admit: 2015-02-21 | Discharge: 2015-02-21 | Disposition: A | Discharge status: Home / Self Care | Payer: Self-pay | Source: Ambulatory Visit | Attending: Cardiology | Admitting: Cardiology

## 2015-02-24 ENCOUNTER — Encounter (HOSPITAL_COMMUNITY)
Admission: RE | Admit: 2015-02-24 | Discharge: 2015-02-24 | Disposition: A | Discharge status: Home / Self Care | Payer: Self-pay | Source: Ambulatory Visit | Attending: Cardiology | Admitting: Cardiology

## 2015-02-26 ENCOUNTER — Encounter (HOSPITAL_COMMUNITY)
Admission: RE | Admit: 2015-02-26 | Discharge: 2015-02-26 | Disposition: A | Discharge status: Home / Self Care | Payer: Self-pay | Source: Ambulatory Visit | Attending: Cardiology | Admitting: Cardiology

## 2015-02-28 ENCOUNTER — Encounter (HOSPITAL_COMMUNITY)
Admission: RE | Admit: 2015-02-28 | Discharge: 2015-02-28 | Disposition: A | Discharge status: Home / Self Care | Payer: Self-pay | Source: Ambulatory Visit | Attending: Cardiology | Admitting: Cardiology

## 2015-03-03 ENCOUNTER — Encounter (HOSPITAL_COMMUNITY)
Admission: RE | Admit: 2015-03-03 | Discharge: 2015-03-03 | Disposition: A | Discharge status: Home / Self Care | Payer: Self-pay | Source: Ambulatory Visit | Attending: Cardiology | Admitting: Cardiology

## 2015-03-03 DIAGNOSIS — Z48812 Encounter for surgical aftercare following surgery on the circulatory system: Secondary | ICD-10-CM | POA: Insufficient documentation

## 2015-03-03 DIAGNOSIS — Z9861 Coronary angioplasty status: Secondary | ICD-10-CM | POA: Insufficient documentation

## 2015-03-04 ENCOUNTER — Other Ambulatory Visit: Payer: Self-pay | Admitting: Nurse Practitioner

## 2015-03-05 ENCOUNTER — Encounter (HOSPITAL_COMMUNITY)
Admission: RE | Admit: 2015-03-05 | Discharge: 2015-03-05 | Disposition: A | Discharge status: Home / Self Care | Payer: Self-pay | Source: Ambulatory Visit | Attending: Cardiology | Admitting: Cardiology

## 2015-03-06 ENCOUNTER — Ambulatory Visit: Payer: BLUE CROSS/BLUE SHIELD | Admitting: Cardiology

## 2015-03-07 ENCOUNTER — Encounter (HOSPITAL_COMMUNITY)
Admission: RE | Admit: 2015-03-07 | Discharge: 2015-03-07 | Disposition: A | Discharge status: Home / Self Care | Payer: Self-pay | Source: Ambulatory Visit | Attending: Cardiology | Admitting: Cardiology

## 2015-03-10 ENCOUNTER — Encounter (HOSPITAL_COMMUNITY): Payer: Self-pay

## 2015-03-12 ENCOUNTER — Encounter (HOSPITAL_COMMUNITY)
Admission: RE | Admit: 2015-03-12 | Discharge: 2015-03-12 | Disposition: A | Discharge status: Home / Self Care | Payer: Self-pay | Source: Ambulatory Visit | Attending: Cardiology | Admitting: Cardiology

## 2015-03-14 ENCOUNTER — Encounter (HOSPITAL_COMMUNITY): Payer: Self-pay

## 2015-03-17 ENCOUNTER — Encounter (HOSPITAL_COMMUNITY)
Admission: RE | Admit: 2015-03-17 | Discharge: 2015-03-17 | Disposition: A | Discharge status: Home / Self Care | Payer: Self-pay | Source: Ambulatory Visit | Attending: Cardiology | Admitting: Cardiology

## 2015-03-18 NOTE — Progress Notes (Signed)
Cardiology Office Note   Date:  03/19/2015   ID:  Blake Carter, DOB [birth year], MRN [id number]  PCP:  Gennette Pac, MD    Chief Complaint  Patient presents with  . Coronary Artery Disease  . Hypertension      History of Present Illness: This is a 64yo male with a history of HTN, dyslipidemia, dilated aortic root, severe 2 vessel ASCAD with 60-70% mid LAD with positive FFR and 90% distal left circ s/p DES to the LAD and distal LCX. He is on DAPT with ASA/Plavix. He denies any chest pain, SOB, DOE, LE edema, palpitations or syncope. He has had some dizziness with changes in position and has had some low BPs in cardiac rehab in the 85'Y systolic     Past Medical History  Diagnosis Date  . Hypertension   . Hyperlipidemia     under control  . Environmental allergies     dry cough  . OCD (obsessive compulsive disorder)   . SUI (stress urinary incontinence), male     S/P ROBOTIC PROSTATECTOMY AND RADIATION TX  . Hypothyroidism   . Depression   . Panic disorder   . GERD (gastroesophageal reflux disease)   . Allergic rhinitis   . ED (erectile dysfunction)   . PONV (postoperative nausea and vomiting)     after thyroid surgery no problems in 10 years   . Sinus bradycardia     usually in 50's  . History of hiatal hernia   . Coronary artery disease     a. 05/2014 Cath/PCI: LM nl, LAD 40p, 60-70d (FFR 0.80->3.0x24 Synergy DES), D2 40, LCX 90d (2.25x12 Synergy DES), RCA 20-34m, EF 60-65%.  . Arthritis     "a little bit in some of my joints" (06/20/2014)  . Hydronephrosis of right kidney     a. s/p ureteral stenting in the past.  . Prostate cancer [facility])     prostate cancer- robotic prostatectomy - followed by radiation because of positive lymph node--and pt on lupron injections    Past Surgical History  Procedure Laterality Date  . Robot assisted laparoscopic radical prostatectomy  05/2010  . Thyroidectomy, partial  10/1974  . Cystoscopy   11/30/2011    Procedure: CYSTOSCOPY;  Surgeon: Reece Packer, MD;  Location: [facility];  Service: Urology;  Laterality: N/A;  Inplantation of Artificial Sphincter and Cystoscopy  . Urinary sphincter implant  11/30/2011    Procedure: ARTIFICIAL URINARY SPHINCTER;  Surgeon: Reece Packer, MD;  Location: [facility];  Service: Urology;  Laterality: N/A;  . Total thyroidectomy  10/2012    "nuked it"  . Upper gi endoscopy      food impaction 2009 done by Dr Oletta Lamas  . Refractive surgery Bilateral 2004  . Tonsillectomy  1956   . Colonoscopy  07/2013    Dr. Oletta Lamas  . Cystoscopy with retrograde pyelogram, ureteroscopy and stent placement Right 02/11/2014    Procedure: CYSTOSCOPY WITH RETROGRADE PYELOGRAM, URETEROSCOPY ,URETERAL BIOPSY AND STENT PLACEMENT;  Surgeon: Raynelle Bring, MD;  Location: [facility];  Service: Urology;  Laterality: Right;  . Cystoscopy with retrograde pyelogram, ureteroscopy and stent placement Right 04/15/2014    Procedure: [place], URETEROSCOPY, BIOPSY AND STENT PLACEMENT;  Surgeon: Raynelle Bring, MD;  Location: [facility];  Service: Urology;  Laterality: Right;  . Coronary angioplasty with stent placement  06/20/2014    "2"  . Left heart catheterization with  coronary angiogram N/A 06/20/2014    Procedure: LEFT HEART CATHETERIZATION WITH CORONARY ANGIOGRAM;  Surgeon: Leonie Man, MD;  Location[facility] LAB;  Service: Cardiovascular;  Laterality: N/A;  . Percutaneous coronary stent intervention (pci-s)  06/20/2014    Procedure: PERCUTANEOUS CORONARY STENT INTERVENTION (PCI-S);  Surgeon: Leonie Man, MD;  Location: [facility] CATH LAB;  Service: Cardiovascular;;  LAD and Circumflex     Current Outpatient Prescriptions  Medication Sig Dispense Refill  . aspirin EC 81 MG tablet Take 81 mg by mouth daily.    Marland Kitchen atorvastatin (LIPITOR) 40 MG tablet Take 40 mg by mouth every morning.    . clopidogrel (PLAVIX) 75 MG tablet Take 75 mg by mouth daily. Take with  breakfast    . diazepam (VALIUM) 5 MG tablet Take 5 mg by mouth every 6 (six) hours as needed for anxiety.    . fluvoxaMINE (LUVOX) 50 MG tablet Take 150 mg by mouth every morning.     Marland Kitchen gemfibrozil (LOPID) 600 MG tablet Take 600 mg by mouth 2 (two) times daily.     Marland Kitchen latanoprost (XALATAN) 0.005 % ophthalmic solution Place 1 drop into the left eye at bedtime.    Marland Kitchen levothyroxine (SYNTHROID, LEVOTHROID) 175 MCG tablet Take 175 mcg by mouth daily before breakfast.    . lisinopril (PRINIVIL,ZESTRIL) 10 MG tablet Take 1 tablet (10 mg total) by mouth every morning. 90 tablet 3  . nitroGLYCERIN (NITROSTAT) 0.4 MG SL tablet Place 1 tablet (0.4 mg total) under the tongue every 5 (five) minutes as needed for chest pain. 25 tablet 3  . pantoprazole (PROTONIX) 40 MG tablet Take 2 tablets (80 mg total) by mouth daily. 180 tablet 3  . zolpidem (AMBIEN) 10 MG tablet Take 10 mg by mouth at bedtime as needed for sleep.     Marland Kitchen HYDROcodone-acetaminophen (NORCO/VICODIN) 5-325 MG per tablet Take 1-2 tablets by mouth every 6 (six) hours as needed. (Patient not taking: Reported on 03/19/2015) 25 tablet 0   No current facility-administered medications for this visit.    Allergies:   Niacin and related and Tagamet    Social History:  The patient  reports that he has never smoked. He has never used smokeless tobacco. He reports that he drinks alcohol. He reports that he does not use illicit drugs.   Family History:  The patient's family history includes CAD in his brother; Cancer in his father; Heart disease in his father; Hyperlipidemia in his father.    ROS:  Please see the history of present illness.   Otherwise, review of systems are positive for none.   All other systems are reviewed and negative.    PHYSICAL EXAM: VS:  BP 122/84 mmHg  Pulse 65  Ht 5\' 11"  (1.803 m)  Wt 250 lb (113.399 kg)  BMI 34.88 kg/m2 , BMI Body mass index is 34.88 kg/(m^2). GEN: Well nourished, well developed, in no acute  distress HEENT: normal Neck: no JVD, carotid bruits, or masses Cardiac: RRR; no murmurs, rubs, or gallops,no edema  Respiratory:  clear to auscultation bilaterally, normal work of breathing GI: soft, nontender, nondistended, + BS MS: no deformity or atrophy Skin: warm and dry, no rash Neuro:  Strength and sensation are intact Psych: euthymic mood, full affect   EKG:  EKG is not ordered today.    Recent Labs: 06/21/2014: BUN 10; Creatinine, Ser 1.12; Hemoglobin 12.4*; Platelets 238; Potassium 4.0; Sodium 135 10/07/2014: ALT 15 10/30/2014: TSH 1.00    Lipid Panel  Component Value Date/Time   CHOL 114 10/07/2014 0918   TRIG 167.0* 10/07/2014 0918   HDL 28.90* 10/07/2014 0918   CHOLHDL 4 10/07/2014 0918   VLDL 33.4 10/07/2014 0918   LDLCALC 52 10/07/2014 0918      Wt Readings from Last 3 Encounters:  03/19/15 250 lb (113.399 kg)  10/30/14 247 lb (112.038 kg)  09/05/14 247 lb 9.6 oz (112.311 kg)    ASSESSMENT AND PLAN:  1. Coronary artery disease with 2 vessel obstructive ASCAD of the mid LAD and distal LCX s/p PCI with DES to both vessels. Now on DAPT with ASA and Plavix   2. HTN - controlled.  - continue lisinopril 3. Dyslipidemia - last LDL was 52 on 09/2014.  - continue statin/lopid  4. Mildly dilated aortic root - repeat echo 10/2015   Current medicines are reviewed at length with the patient today.  The patient does not have concerns regarding medicines.  The following changes have been made:  no change  Labs/ tests ordered today: See above Assessment and Plan No orders of the defined types were placed in this encounter.     Disposition:   FU with me in 6 months  Signed, Sueanne Margarita, MD  03/19/2015 8:12 AM    [facility] [place], [place], [place]  [address] Phone: [phone]; Fax: [phone]

## 2015-03-19 ENCOUNTER — Ambulatory Visit (INDEPENDENT_AMBULATORY_CARE_PROVIDER_SITE_OTHER): Payer: BLUE CROSS/BLUE SHIELD | Admitting: Cardiology

## 2015-03-19 ENCOUNTER — Encounter: Payer: Self-pay | Admitting: Cardiology

## 2015-03-19 ENCOUNTER — Encounter (HOSPITAL_COMMUNITY)
Admission: RE | Admit: 2015-03-19 | Discharge: 2015-03-19 | Disposition: A | Discharge status: Home / Self Care | Payer: Self-pay | Source: Ambulatory Visit | Attending: Cardiology | Admitting: Cardiology

## 2015-03-19 VITALS — BP 122/84 | HR 65 | Ht 71.0 in | Wt 250.0 lb

## 2015-03-19 DIAGNOSIS — I2510 Atherosclerotic heart disease of native coronary artery without angina pectoris: Secondary | ICD-10-CM

## 2015-03-19 DIAGNOSIS — E785 Hyperlipidemia, unspecified: Secondary | ICD-10-CM

## 2015-03-19 DIAGNOSIS — I77810 Thoracic aortic ectasia: Secondary | ICD-10-CM | POA: Diagnosis not present

## 2015-03-19 DIAGNOSIS — I10 Essential (primary) hypertension: Secondary | ICD-10-CM

## 2015-03-19 DIAGNOSIS — Z9861 Coronary angioplasty status: Secondary | ICD-10-CM

## 2015-03-19 DIAGNOSIS — R001 Bradycardia, unspecified: Secondary | ICD-10-CM | POA: Diagnosis not present

## 2015-03-19 NOTE — Patient Instructions (Signed)

## 2015-03-21 ENCOUNTER — Encounter (HOSPITAL_COMMUNITY): Payer: Self-pay

## 2015-03-24 ENCOUNTER — Encounter (HOSPITAL_COMMUNITY): Payer: Self-pay

## 2015-03-26 ENCOUNTER — Encounter (HOSPITAL_COMMUNITY)
Admission: RE | Admit: 2015-03-26 | Discharge: 2015-03-26 | Disposition: A | Discharge status: Home / Self Care | Payer: Self-pay | Source: Ambulatory Visit | Attending: Cardiology | Admitting: Cardiology

## 2015-03-28 ENCOUNTER — Encounter (HOSPITAL_COMMUNITY)
Admission: RE | Admit: 2015-03-28 | Discharge: 2015-03-28 | Disposition: A | Discharge status: Home / Self Care | Payer: Self-pay | Source: Ambulatory Visit | Attending: Cardiology | Admitting: Cardiology

## 2015-03-31 ENCOUNTER — Encounter (HOSPITAL_COMMUNITY)
Admission: RE | Admit: 2015-03-31 | Discharge: 2015-03-31 | Disposition: A | Discharge status: Home / Self Care | Payer: Self-pay | Source: Ambulatory Visit | Attending: Cardiology | Admitting: Cardiology

## 2015-04-02 ENCOUNTER — Encounter (HOSPITAL_COMMUNITY)
Admission: RE | Admit: 2015-04-02 | Discharge: 2015-04-02 | Disposition: A | Discharge status: Home / Self Care | Payer: Self-pay | Source: Ambulatory Visit | Attending: Cardiology | Admitting: Cardiology

## 2015-04-02 DIAGNOSIS — Z955 Presence of coronary angioplasty implant and graft: Secondary | ICD-10-CM | POA: Insufficient documentation

## 2015-04-02 DIAGNOSIS — Z48812 Encounter for surgical aftercare following surgery on the circulatory system: Secondary | ICD-10-CM | POA: Insufficient documentation

## 2015-04-04 ENCOUNTER — Encounter (HOSPITAL_COMMUNITY): Payer: Self-pay

## 2015-04-07 ENCOUNTER — Encounter (HOSPITAL_COMMUNITY)
Admission: RE | Admit: 2015-04-07 | Discharge: 2015-04-07 | Disposition: A | Discharge status: Home / Self Care | Payer: Self-pay | Source: Ambulatory Visit | Attending: Cardiology | Admitting: Cardiology

## 2015-04-09 ENCOUNTER — Encounter (HOSPITAL_COMMUNITY)
Admission: RE | Admit: 2015-04-09 | Discharge: 2015-04-09 | Disposition: A | Discharge status: Home / Self Care | Payer: Self-pay | Source: Ambulatory Visit | Attending: Cardiology | Admitting: Cardiology

## 2015-04-11 ENCOUNTER — Encounter (HOSPITAL_COMMUNITY): Payer: Self-pay

## 2015-04-14 ENCOUNTER — Encounter (HOSPITAL_COMMUNITY)
Admission: RE | Admit: 2015-04-14 | Discharge: 2015-04-14 | Disposition: A | Discharge status: Home / Self Care | Payer: Self-pay | Source: Ambulatory Visit | Attending: Cardiology | Admitting: Cardiology

## 2015-04-16 ENCOUNTER — Encounter (HOSPITAL_COMMUNITY)
Admission: RE | Admit: 2015-04-16 | Discharge: 2015-04-16 | Disposition: A | Discharge status: Home / Self Care | Payer: Self-pay | Source: Ambulatory Visit | Attending: Cardiology | Admitting: Cardiology

## 2015-04-18 ENCOUNTER — Encounter (HOSPITAL_COMMUNITY)
Admission: RE | Admit: 2015-04-18 | Discharge: 2015-04-18 | Disposition: A | Discharge status: Home / Self Care | Payer: Self-pay | Source: Ambulatory Visit | Attending: Cardiology | Admitting: Cardiology

## 2015-04-21 ENCOUNTER — Encounter (HOSPITAL_COMMUNITY)
Admission: RE | Admit: 2015-04-21 | Discharge: 2015-04-21 | Disposition: A | Discharge status: Home / Self Care | Payer: Self-pay | Source: Ambulatory Visit | Attending: Cardiology | Admitting: Cardiology

## 2015-04-23 ENCOUNTER — Encounter (HOSPITAL_COMMUNITY): Payer: Self-pay

## 2015-04-28 ENCOUNTER — Encounter (HOSPITAL_COMMUNITY)
Admission: RE | Admit: 2015-04-28 | Discharge: 2015-04-28 | Disposition: A | Discharge status: Home / Self Care | Payer: Self-pay | Source: Ambulatory Visit | Attending: Cardiology | Admitting: Cardiology

## 2015-04-30 ENCOUNTER — Encounter (HOSPITAL_COMMUNITY)
Admission: RE | Admit: 2015-04-30 | Discharge: 2015-04-30 | Disposition: A | Discharge status: Home / Self Care | Payer: Self-pay | Source: Ambulatory Visit | Attending: Cardiology | Admitting: Cardiology

## 2015-05-02 ENCOUNTER — Encounter (HOSPITAL_COMMUNITY)
Admission: RE | Admit: 2015-05-02 | Discharge: 2015-05-02 | Disposition: A | Discharge status: Home / Self Care | Payer: Self-pay | Source: Ambulatory Visit | Attending: Cardiology | Admitting: Cardiology

## 2015-05-02 DIAGNOSIS — Z48812 Encounter for surgical aftercare following surgery on the circulatory system: Secondary | ICD-10-CM | POA: Insufficient documentation

## 2015-05-02 DIAGNOSIS — Z955 Presence of coronary angioplasty implant and graft: Secondary | ICD-10-CM | POA: Insufficient documentation

## 2015-05-05 ENCOUNTER — Encounter (HOSPITAL_COMMUNITY)
Admission: RE | Admit: 2015-05-05 | Discharge: 2015-05-05 | Disposition: A | Discharge status: Home / Self Care | Payer: Self-pay | Source: Ambulatory Visit | Attending: Cardiology | Admitting: Cardiology

## 2015-05-07 ENCOUNTER — Encounter (HOSPITAL_COMMUNITY)
Admission: RE | Admit: 2015-05-07 | Discharge: 2015-05-07 | Disposition: A | Discharge status: Home / Self Care | Payer: Self-pay | Source: Ambulatory Visit | Attending: Cardiology | Admitting: Cardiology

## 2015-05-09 ENCOUNTER — Encounter (HOSPITAL_COMMUNITY)
Admission: RE | Admit: 2015-05-09 | Discharge: 2015-05-09 | Disposition: A | Discharge status: Home / Self Care | Payer: Self-pay | Source: Ambulatory Visit | Attending: Cardiology | Admitting: Cardiology

## 2015-05-12 ENCOUNTER — Encounter (HOSPITAL_COMMUNITY)
Admission: RE | Admit: 2015-05-12 | Discharge: 2015-05-12 | Disposition: A | Discharge status: Home / Self Care | Payer: Self-pay | Source: Ambulatory Visit | Attending: Cardiology | Admitting: Cardiology

## 2015-05-14 ENCOUNTER — Encounter (HOSPITAL_COMMUNITY)
Admission: RE | Admit: 2015-05-14 | Discharge: 2015-05-14 | Disposition: A | Discharge status: Home / Self Care | Payer: Self-pay | Source: Ambulatory Visit | Attending: Cardiology | Admitting: Cardiology

## 2015-05-16 ENCOUNTER — Encounter (HOSPITAL_COMMUNITY)
Admission: RE | Admit: 2015-05-16 | Discharge: 2015-05-16 | Disposition: A | Discharge status: Home / Self Care | Payer: Self-pay | Source: Ambulatory Visit | Attending: Cardiology | Admitting: Cardiology

## 2015-05-16 ENCOUNTER — Other Ambulatory Visit: Payer: Self-pay | Admitting: Cardiology

## 2015-05-19 ENCOUNTER — Encounter (HOSPITAL_COMMUNITY)
Admission: RE | Admit: 2015-05-19 | Discharge: 2015-05-19 | Disposition: A | Discharge status: Home / Self Care | Payer: Self-pay | Source: Ambulatory Visit | Attending: Cardiology | Admitting: Cardiology

## 2015-05-21 ENCOUNTER — Encounter (HOSPITAL_COMMUNITY)
Admission: RE | Admit: 2015-05-21 | Discharge: 2015-05-21 | Disposition: A | Discharge status: Home / Self Care | Payer: Self-pay | Source: Ambulatory Visit | Attending: Cardiology | Admitting: Cardiology

## 2015-05-23 ENCOUNTER — Encounter (HOSPITAL_COMMUNITY)
Admission: RE | Admit: 2015-05-23 | Discharge: 2015-05-23 | Disposition: A | Discharge status: Home / Self Care | Payer: Self-pay | Source: Ambulatory Visit | Attending: Cardiology | Admitting: Cardiology

## 2015-05-28 ENCOUNTER — Encounter (HOSPITAL_COMMUNITY): Payer: Self-pay

## 2015-05-30 ENCOUNTER — Encounter (HOSPITAL_COMMUNITY): Payer: Self-pay

## 2015-06-04 ENCOUNTER — Encounter (HOSPITAL_COMMUNITY)
Admission: RE | Admit: 2015-06-04 | Discharge: 2015-06-04 | Disposition: A | Discharge status: Home / Self Care | Payer: Self-pay | Source: Ambulatory Visit | Attending: Cardiology | Admitting: Cardiology

## 2015-06-04 DIAGNOSIS — Z955 Presence of coronary angioplasty implant and graft: Secondary | ICD-10-CM | POA: Insufficient documentation

## 2015-06-04 DIAGNOSIS — Z48812 Encounter for surgical aftercare following surgery on the circulatory system: Secondary | ICD-10-CM | POA: Insufficient documentation

## 2015-06-06 ENCOUNTER — Encounter (HOSPITAL_COMMUNITY)
Admission: RE | Admit: 2015-06-06 | Discharge: 2015-06-06 | Disposition: A | Discharge status: Home / Self Care | Payer: Self-pay | Source: Ambulatory Visit | Attending: Cardiology | Admitting: Cardiology

## 2015-06-09 ENCOUNTER — Encounter (HOSPITAL_COMMUNITY)
Admission: RE | Admit: 2015-06-09 | Discharge: 2015-06-09 | Disposition: A | Discharge status: Home / Self Care | Payer: Self-pay | Source: Ambulatory Visit | Attending: Cardiology | Admitting: Cardiology

## 2015-06-11 ENCOUNTER — Encounter (HOSPITAL_COMMUNITY)
Admission: RE | Admit: 2015-06-11 | Discharge: 2015-06-11 | Disposition: A | Discharge status: Home / Self Care | Payer: Self-pay | Source: Ambulatory Visit | Attending: Cardiology | Admitting: Cardiology

## 2015-06-13 ENCOUNTER — Encounter (HOSPITAL_COMMUNITY)
Admission: RE | Admit: 2015-06-13 | Discharge: 2015-06-13 | Disposition: A | Discharge status: Home / Self Care | Payer: Self-pay | Source: Ambulatory Visit | Attending: Cardiology | Admitting: Cardiology

## 2015-06-16 ENCOUNTER — Encounter (HOSPITAL_COMMUNITY)
Admission: RE | Admit: 2015-06-16 | Discharge: 2015-06-16 | Disposition: A | Discharge status: Home / Self Care | Payer: Self-pay | Source: Ambulatory Visit | Attending: Cardiology | Admitting: Cardiology

## 2015-06-18 ENCOUNTER — Encounter (HOSPITAL_COMMUNITY)
Admission: RE | Admit: 2015-06-18 | Discharge: 2015-06-18 | Disposition: A | Discharge status: Home / Self Care | Payer: Self-pay | Source: Ambulatory Visit | Attending: Cardiology | Admitting: Cardiology

## 2015-06-20 ENCOUNTER — Encounter (HOSPITAL_COMMUNITY)
Admission: RE | Admit: 2015-06-20 | Discharge: 2015-06-20 | Disposition: A | Discharge status: Home / Self Care | Payer: Self-pay | Source: Ambulatory Visit | Attending: Cardiology | Admitting: Cardiology

## 2015-06-23 ENCOUNTER — Encounter (HOSPITAL_COMMUNITY)
Admission: RE | Admit: 2015-06-23 | Discharge: 2015-06-23 | Disposition: A | Discharge status: Home / Self Care | Payer: Self-pay | Source: Ambulatory Visit | Attending: Cardiology | Admitting: Cardiology

## 2015-06-25 ENCOUNTER — Encounter (HOSPITAL_COMMUNITY)
Admission: RE | Admit: 2015-06-25 | Discharge: 2015-06-25 | Disposition: A | Discharge status: Home / Self Care | Payer: Self-pay | Source: Ambulatory Visit | Attending: Cardiology | Admitting: Cardiology

## 2015-06-27 ENCOUNTER — Encounter (HOSPITAL_COMMUNITY): Payer: Self-pay

## 2015-06-30 ENCOUNTER — Encounter (HOSPITAL_COMMUNITY)
Admission: RE | Admit: 2015-06-30 | Discharge: 2015-06-30 | Disposition: A | Discharge status: Home / Self Care | Payer: Self-pay | Source: Ambulatory Visit | Attending: Cardiology | Admitting: Cardiology

## 2015-07-02 ENCOUNTER — Encounter (HOSPITAL_COMMUNITY): Payer: BLUE CROSS/BLUE SHIELD

## 2015-07-02 DIAGNOSIS — Z955 Presence of coronary angioplasty implant and graft: Secondary | ICD-10-CM | POA: Insufficient documentation

## 2015-07-02 DIAGNOSIS — Z48812 Encounter for surgical aftercare following surgery on the circulatory system: Secondary | ICD-10-CM | POA: Insufficient documentation

## 2015-07-04 ENCOUNTER — Encounter (HOSPITAL_COMMUNITY)
Admission: RE | Admit: 2015-07-04 | Discharge: 2015-07-04 | Disposition: A | Discharge status: Home / Self Care | Payer: Self-pay | Source: Ambulatory Visit | Attending: Cardiology | Admitting: Cardiology

## 2015-07-07 ENCOUNTER — Encounter (HOSPITAL_COMMUNITY)
Admission: RE | Admit: 2015-07-07 | Discharge: 2015-07-07 | Disposition: A | Discharge status: Home / Self Care | Payer: Self-pay | Source: Ambulatory Visit | Attending: Cardiology | Admitting: Cardiology

## 2015-07-09 ENCOUNTER — Encounter (HOSPITAL_COMMUNITY): Payer: Self-pay

## 2015-07-11 ENCOUNTER — Encounter (HOSPITAL_COMMUNITY): Payer: Self-pay

## 2015-07-14 ENCOUNTER — Encounter (HOSPITAL_COMMUNITY): Payer: Self-pay

## 2015-07-16 ENCOUNTER — Encounter (HOSPITAL_COMMUNITY)
Admission: RE | Admit: 2015-07-16 | Discharge: 2015-07-16 | Disposition: A | Discharge status: Home / Self Care | Payer: Self-pay | Source: Ambulatory Visit | Attending: Cardiology | Admitting: Cardiology

## 2015-07-18 ENCOUNTER — Encounter (HOSPITAL_COMMUNITY)
Admission: RE | Admit: 2015-07-18 | Discharge: 2015-07-18 | Disposition: A | Discharge status: Home / Self Care | Payer: Self-pay | Source: Ambulatory Visit | Attending: Cardiology | Admitting: Cardiology

## 2015-07-21 ENCOUNTER — Encounter (HOSPITAL_COMMUNITY)
Admission: RE | Admit: 2015-07-21 | Discharge: 2015-07-21 | Disposition: A | Discharge status: Home / Self Care | Payer: Self-pay | Source: Ambulatory Visit | Attending: Cardiology | Admitting: Cardiology

## 2015-07-23 ENCOUNTER — Encounter (HOSPITAL_COMMUNITY)
Admission: RE | Admit: 2015-07-23 | Discharge: 2015-07-23 | Disposition: A | Discharge status: Home / Self Care | Payer: Self-pay | Source: Ambulatory Visit | Attending: Cardiology | Admitting: Cardiology

## 2015-07-25 ENCOUNTER — Encounter (HOSPITAL_COMMUNITY)
Admission: RE | Admit: 2015-07-25 | Discharge: 2015-07-25 | Disposition: A | Discharge status: Home / Self Care | Payer: Self-pay | Source: Ambulatory Visit | Attending: Cardiology | Admitting: Cardiology

## 2015-07-28 ENCOUNTER — Encounter (HOSPITAL_COMMUNITY): Payer: Self-pay

## 2015-07-30 ENCOUNTER — Encounter (HOSPITAL_COMMUNITY)
Admission: RE | Admit: 2015-07-30 | Discharge: 2015-07-30 | Disposition: A | Discharge status: Home / Self Care | Payer: Self-pay | Source: Ambulatory Visit | Attending: Cardiology | Admitting: Cardiology

## 2015-07-30 DIAGNOSIS — Z955 Presence of coronary angioplasty implant and graft: Secondary | ICD-10-CM | POA: Insufficient documentation

## 2015-07-30 DIAGNOSIS — Z48812 Encounter for surgical aftercare following surgery on the circulatory system: Secondary | ICD-10-CM | POA: Insufficient documentation

## 2015-08-01 ENCOUNTER — Encounter (HOSPITAL_COMMUNITY)
Admission: RE | Admit: 2015-08-01 | Discharge: 2015-08-01 | Disposition: A | Discharge status: Home / Self Care | Payer: Self-pay | Source: Ambulatory Visit | Attending: Cardiology | Admitting: Cardiology

## 2015-08-04 ENCOUNTER — Encounter (HOSPITAL_COMMUNITY): Payer: Self-pay

## 2015-08-06 ENCOUNTER — Encounter (HOSPITAL_COMMUNITY): Payer: Self-pay

## 2015-08-08 ENCOUNTER — Encounter (HOSPITAL_COMMUNITY): Payer: Self-pay

## 2015-08-10 ENCOUNTER — Other Ambulatory Visit: Payer: Self-pay | Admitting: Nurse Practitioner

## 2015-08-11 ENCOUNTER — Encounter (HOSPITAL_COMMUNITY): Payer: Self-pay

## 2015-08-11 ENCOUNTER — Other Ambulatory Visit: Payer: Self-pay | Admitting: Physician Assistant

## 2015-08-13 ENCOUNTER — Encounter (HOSPITAL_COMMUNITY): Payer: Self-pay

## 2015-08-15 ENCOUNTER — Encounter (HOSPITAL_COMMUNITY)
Admission: RE | Admit: 2015-08-15 | Discharge: 2015-08-15 | Disposition: A | Discharge status: Home / Self Care | Payer: Self-pay | Source: Ambulatory Visit | Attending: Cardiology | Admitting: Cardiology

## 2015-08-18 ENCOUNTER — Encounter (HOSPITAL_COMMUNITY)
Admission: RE | Admit: 2015-08-18 | Discharge: 2015-08-18 | Disposition: A | Discharge status: Home / Self Care | Payer: Self-pay | Source: Ambulatory Visit | Attending: Cardiology | Admitting: Cardiology

## 2015-08-20 ENCOUNTER — Encounter (HOSPITAL_COMMUNITY)
Admission: RE | Admit: 2015-08-20 | Discharge: 2015-08-20 | Disposition: A | Discharge status: Home / Self Care | Payer: Self-pay | Source: Ambulatory Visit | Attending: Cardiology | Admitting: Cardiology

## 2015-08-22 ENCOUNTER — Encounter (HOSPITAL_COMMUNITY)
Admission: RE | Admit: 2015-08-22 | Discharge: 2015-08-22 | Disposition: A | Discharge status: Home / Self Care | Payer: Self-pay | Source: Ambulatory Visit | Attending: Cardiology | Admitting: Cardiology

## 2015-08-25 ENCOUNTER — Encounter (HOSPITAL_COMMUNITY)
Admission: RE | Admit: 2015-08-25 | Discharge: 2015-08-25 | Disposition: A | Discharge status: Home / Self Care | Payer: Self-pay | Source: Ambulatory Visit | Attending: Cardiology | Admitting: Cardiology

## 2015-08-27 ENCOUNTER — Encounter (HOSPITAL_COMMUNITY)
Admission: RE | Admit: 2015-08-27 | Discharge: 2015-08-27 | Disposition: A | Discharge status: Home / Self Care | Payer: Self-pay | Source: Ambulatory Visit | Attending: Cardiology | Admitting: Cardiology

## 2015-08-29 ENCOUNTER — Encounter (HOSPITAL_COMMUNITY)
Admission: RE | Admit: 2015-08-29 | Discharge: 2015-08-29 | Disposition: A | Discharge status: Home / Self Care | Payer: Self-pay | Source: Ambulatory Visit | Attending: Cardiology | Admitting: Cardiology

## 2015-09-01 ENCOUNTER — Encounter (HOSPITAL_COMMUNITY): Payer: Self-pay

## 2015-09-01 DIAGNOSIS — Z48812 Encounter for surgical aftercare following surgery on the circulatory system: Secondary | ICD-10-CM | POA: Insufficient documentation

## 2015-09-01 DIAGNOSIS — Z955 Presence of coronary angioplasty implant and graft: Secondary | ICD-10-CM | POA: Insufficient documentation

## 2015-09-03 ENCOUNTER — Encounter (HOSPITAL_COMMUNITY)
Admission: RE | Admit: 2015-09-03 | Discharge: 2015-09-03 | Disposition: A | Discharge status: Home / Self Care | Payer: Self-pay | Source: Ambulatory Visit | Attending: Cardiology | Admitting: Cardiology

## 2015-09-05 ENCOUNTER — Encounter (HOSPITAL_COMMUNITY)
Admission: RE | Admit: 2015-09-05 | Discharge: 2015-09-05 | Disposition: A | Discharge status: Home / Self Care | Payer: Self-pay | Source: Ambulatory Visit | Attending: Cardiology | Admitting: Cardiology

## 2015-09-08 ENCOUNTER — Encounter (HOSPITAL_COMMUNITY)
Admission: RE | Admit: 2015-09-08 | Discharge: 2015-09-08 | Disposition: A | Discharge status: Home / Self Care | Payer: Self-pay | Source: Ambulatory Visit | Attending: Cardiology | Admitting: Cardiology

## 2015-09-10 ENCOUNTER — Encounter (HOSPITAL_COMMUNITY): Payer: Self-pay

## 2015-09-12 ENCOUNTER — Other Ambulatory Visit: Payer: Self-pay | Admitting: Nurse Practitioner

## 2015-09-12 ENCOUNTER — Encounter (HOSPITAL_COMMUNITY): Payer: Self-pay

## 2015-09-15 ENCOUNTER — Encounter (HOSPITAL_COMMUNITY): Payer: Self-pay

## 2015-09-16 ENCOUNTER — Ambulatory Visit (INDEPENDENT_AMBULATORY_CARE_PROVIDER_SITE_OTHER): Payer: BLUE CROSS/BLUE SHIELD | Admitting: Cardiology

## 2015-09-16 ENCOUNTER — Encounter: Payer: Self-pay | Admitting: Cardiology

## 2015-09-16 VITALS — BP 112/70 | HR 56 | Ht 71.0 in | Wt 257.8 lb

## 2015-09-16 DIAGNOSIS — Z9861 Coronary angioplasty status: Secondary | ICD-10-CM

## 2015-09-16 DIAGNOSIS — I77810 Thoracic aortic ectasia: Secondary | ICD-10-CM | POA: Diagnosis not present

## 2015-09-16 DIAGNOSIS — I2510 Atherosclerotic heart disease of native coronary artery without angina pectoris: Secondary | ICD-10-CM | POA: Diagnosis not present

## 2015-09-16 DIAGNOSIS — E785 Hyperlipidemia, unspecified: Secondary | ICD-10-CM

## 2015-09-16 DIAGNOSIS — I10 Essential (primary) hypertension: Secondary | ICD-10-CM | POA: Diagnosis not present

## 2015-09-16 NOTE — Progress Notes (Signed)
Cardiology Office Note    Date:  09/16/2015   ID:  Blake Carter, DOB 01/30/1951, MRN [id number]  PCP:  Gennette Pac, MD  Cardiologist:  Sueanne Margarita, MD   Chief Complaint  Patient presents with  . Coronary Artery Disease  . Hypertension    History of Present Illness:  Blake Carter is a 65 y.o. male with a history of HTN, dyslipidemia, dilated aortic root, severe 2 vessel ASCAD with 60-70% mid LAD with positive FFR and 90% distal left circ s/p DES to the LAD and distal LCX. He is on DAPT with ASA/Plavix. He denies any chest pain, SOB, DOE, LE edema, palpitations or syncope. He has had some dizziness with changes in position and has had some low BPs in cardiac rehab in the [id number] systolic     Past Medical History  Diagnosis Date  . Hypertension   . Hyperlipidemia     under control  . Environmental allergies     dry cough  . OCD (obsessive compulsive disorder)   . SUI (stress urinary incontinence), male     S/P ROBOTIC PROSTATECTOMY AND RADIATION TX  . Hypothyroidism   . Depression   . Panic disorder   . GERD (gastroesophageal reflux disease)   . Allergic rhinitis   . ED (erectile dysfunction)   . PONV (postoperative nausea and vomiting)     after thyroid surgery no problems in 10 years   . Sinus bradycardia     usually in 50's  . History of hiatal hernia   . Coronary artery disease     a. 05/2014 Cath/PCI: LM nl, LAD 40p, 60-70d (FFR 0.80->3.0x24 Synergy DES), D2 40, LCX 90d (2.25x12 Synergy DES), RCA 20-74m, EF 60-65%.  . Arthritis     "a little bit in some of my joints" (06/20/2014)  . Hydronephrosis of right kidney     a. s/p ureteral stenting in the past.  . Prostate cancer [facility])     prostate cancer- robotic prostatectomy - followed by radiation because of positive lymph node--and pt on lupron injections    Past Surgical History  Procedure Laterality Date  . Robot assisted laparoscopic radical prostatectomy  05/2010  . Thyroidectomy, partial   10/1974  . Cystoscopy  11/30/2011    Procedure: CYSTOSCOPY;  Surgeon: Reece Packer, MD;  Location: [facility];  Service: Urology;  Laterality: N/A;  Inplantation of Artificial Sphincter and Cystoscopy  . Urinary sphincter implant  11/30/2011    Procedure: ARTIFICIAL URINARY SPHINCTER;  Surgeon: Reece Packer, MD;  Location: [facility];  Service: Urology;  Laterality: N/A;  . Total thyroidectomy  10/2012    "nuked it"  . Upper gi endoscopy      food impaction 2009 done by Dr Oletta Lamas  . Refractive surgery Bilateral 2004  . Tonsillectomy  1956   . Colonoscopy  07/2013    Dr. Oletta Lamas  . Cystoscopy with retrograde pyelogram, ureteroscopy and stent placement Right 02/11/2014    Procedure: CYSTOSCOPY WITH RETROGRADE PYELOGRAM, URETEROSCOPY ,URETERAL BIOPSY AND STENT PLACEMENT;  Surgeon: Raynelle Bring, MD;  Location: [facility];  Service: Urology;  Laterality: Right;  . Cystoscopy with retrograde pyelogram, ureteroscopy and stent placement Right 04/15/2014    Procedure: [place], URETEROSCOPY, BIOPSY AND STENT PLACEMENT;  Surgeon: Raynelle Bring, MD;  Location: [facility];  Service: Urology;  Laterality: Right;  . Coronary angioplasty with stent placement  06/20/2014    "2"  . Left heart catheterization with coronary angiogram N/A 06/20/2014    Procedure:  LEFT HEART CATHETERIZATION WITH CORONARY ANGIOGRAM;  Surgeon: Leonie Man, MD;  Location: [facility] CATH LAB;  Service: Cardiovascular;  Laterality: N/A;  . Percutaneous coronary stent intervention (pci-s)  06/20/2014    Procedure: PERCUTANEOUS CORONARY STENT INTERVENTION (PCI-S);  Surgeon: Leonie Man, MD;  Location: [facility] CATH LAB;  Service: Cardiovascular;;  LAD and Circumflex    Current Medications: Outpatient Prescriptions Prior to Visit  Medication Sig Dispense Refill  . aspirin EC 81 MG tablet Take 81 mg by mouth daily.    Marland Kitchen atorvastatin (LIPITOR) 40 MG tablet Take 1 tablet (40 mg total) by mouth every morning. 90 tablet 2  .  diazepam (VALIUM) 5 MG tablet Take 5 mg by mouth every 6 (six) hours as needed for anxiety.    . fluvoxaMINE (LUVOX) 50 MG tablet Take 150 mg by mouth every morning.     Marland Kitchen gemfibrozil (LOPID) 600 MG tablet Take 600 mg by mouth 2 (two) times daily.     Marland Kitchen latanoprost (XALATAN) 0.005 % ophthalmic solution Place 1 drop into both eyes at bedtime.     Marland Kitchen levothyroxine (SYNTHROID, LEVOTHROID) 175 MCG tablet Take 175 mcg by mouth daily before breakfast.    . lisinopril (PRINIVIL,ZESTRIL) 10 MG tablet Take 1 tablet (10 mg total) by mouth every morning. 90 tablet 3  . NITROSTAT 0.4 MG SL tablet PLACE 1 TABLET (0.4 MG TOTAL) UNDER THE TONGUE EVERY 5 (FIVE) MINUTES AS NEEDED FOR CHEST PAIN. 25 tablet 1  . zolpidem (AMBIEN) 10 MG tablet Take 10 mg by mouth at bedtime as needed for sleep.     . clopidogrel (PLAVIX) 75 MG tablet Take 75 mg by mouth daily. Take with breakfast    . clopidogrel (PLAVIX) 75 MG tablet TAKE 1 TABLET DAILY WITH BREAKFAST 90 tablet 0  . HYDROcodone-acetaminophen (NORCO/VICODIN) 5-325 MG per tablet Take 1-2 tablets by mouth every 6 (six) hours as needed. (Patient not taking: Reported on 03/19/2015) 25 tablet 0  . pantoprazole (PROTONIX) 40 MG tablet TAKE 2 TABLETS DAILY 180 tablet 0   No facility-administered medications prior to visit.     Allergies:   Niacin and related and Tagamet   Social History   Social History  . Marital Status: Married    Spouse Name: N/A  . Number of Children: N/A  . Years of Education: N/A   Social History Main Topics  . Smoking status: Never Smoker   . Smokeless tobacco: Never Used  . Alcohol Use: 0.0 oz/week    0 Standard drinks or equivalent per week     Comment: 06/20/2014 "1-2 drinks a couple times/month"  . Drug Use: No  . Sexual Activity: Yes   Other Topics Concern  . None   Social History Narrative     Family History:  The patient's family history includes CAD in his brother; Cancer in his father; Heart disease in his father;  Hyperlipidemia in his father.   ROS:   Please see the history of present illness.    Review of Systems  Constitution: Negative.  HENT: Negative.   Eyes: Negative.   Cardiovascular: Negative.   Respiratory: Negative.   Skin: Negative.   Musculoskeletal: Negative.   Gastrointestinal: Negative.   Genitourinary: Negative.   Neurological: Negative.   Psychiatric/Behavioral: Negative.    All other systems reviewed and are negative.   PHYSICAL EXAM:   VS:  BP 112/70 mmHg  Pulse 56  Ht 5\' 11"  (1.803 m)  Wt 257 lb 12.8 oz (116.937 kg)  BMI  35.97 kg/m2   GEN: Well nourished, well developed, in no acute distress HEENT: normal Neck: no JVD, carotid bruits, or masses Cardiac: RRR; no murmurs, rubs, or gallops,no edema.  Intact distal pulses bilaterally.  Respiratory:  clear to auscultation bilaterally, normal work of breathing GI: soft, nontender, nondistended, + BS MS: no deformity or atrophy Skin: warm and dry, no rash Neuro:  Alert and Oriented x 3, Strength and sensation are intact Psych: euthymic mood, full affect  Wt Readings from Last 3 Encounters:  09/16/15 257 lb 12.8 oz (116.937 kg)  03/19/15 250 lb (113.399 kg)  10/30/14 247 lb (112.038 kg)      Studies/Labs Reviewed:   EKG:  EKG is ordered today and showed sinus bradycardia at 53bpm with no ST changes  Recent Labs: 10/07/2014: ALT 15 10/30/2014: TSH 1.00   Lipid Panel    Component Value Date/Time   CHOL 114 10/07/2014 0918   TRIG 167.0* 10/07/2014 0918   HDL 28.90* 10/07/2014 0918   CHOLHDL 4 10/07/2014 0918   VLDL 33.4 10/07/2014 0918   LDLCALC 52 10/07/2014 0918    Additional studies/ records that were reviewed today include:  none    ASSESSMENT:    1. CAD S/P percutaneous coronary angioplasty   2. Essential hypertension   3. Dilated aortic root ([place])   4. Dyslipidemia      PLAN:  In order of problems listed above:  1. ASCAD s/p PCI of the diagonal and LCx with DES.  He has not angina chest  pain.  He has had some DOE recently reminiscent of his prior anginal equivalent.  I will get a nuclear stress test to rule out ischemia. Continue ASA/statin/Plavix 2. HTN - well controlled. Continue ACE I 3. Dilated aortic root 4. Dyslipidemia with LDL goal < 70.  He will continue on Lopid and lipitor.  I encouraged him to increase the exercise time to 45 minutes daily and reduce portions.    Medication Adjustments/Labs and Tests Ordered: Current medicines are reviewed at length with the patient today.  Concerns regarding medicines are outlined above.  Medication changes, Labs and Tests ordered today are listed in the Patient Instructions below. There are no Patient Instructions on file for this visit.   Lurena Nida, MD  09/16/2015 1:48 PM    [facility] [place], [place], [place]  [address] Phone: [phone]; Fax: [phone]

## 2015-09-16 NOTE — Patient Instructions (Signed)
Medication Instructions:  Your physician recommends that you continue on your current medications as directed. Please refer to the Current Medication list given to you today.   Labwork: Your physician recommends that you return for FASTING lab work in: 4 weeks   Testing/Procedures: Your physician has requested that you have an echocardiogram in June, 2017. Echocardiography is a painless test that uses sound waves to create images of your heart. It provides your doctor with information about the size and shape of your heart and how well your heart's chambers and valves are working. This procedure takes approximately one hour. There are no restrictions for this procedure.   Dr. Radford Pax recommends you have an [place].  Follow-Up: Your physician wants you to follow-up in: 6 months with Dr. Radford Pax. You will receive a reminder letter in the mail two months in advance. If you don't receive a letter, please call our office to schedule the follow-up appointment.   Any Other Special Instructions Will Be Listed Below (If Applicable).     If you need a refill on your cardiac medications before your next appointment, please call your pharmacy.

## 2015-09-17 ENCOUNTER — Encounter (HOSPITAL_COMMUNITY)
Admission: RE | Admit: 2015-09-17 | Discharge: 2015-09-17 | Disposition: A | Discharge status: Home / Self Care | Payer: Self-pay | Source: Ambulatory Visit | Attending: Cardiology | Admitting: Cardiology

## 2015-09-19 ENCOUNTER — Encounter (HOSPITAL_COMMUNITY)
Admission: RE | Admit: 2015-09-19 | Discharge: 2015-09-19 | Disposition: A | Discharge status: Home / Self Care | Payer: Self-pay | Source: Ambulatory Visit | Attending: Cardiology | Admitting: Cardiology

## 2015-09-22 ENCOUNTER — Telehealth (HOSPITAL_COMMUNITY): Payer: Self-pay | Admitting: *Deleted

## 2015-09-22 ENCOUNTER — Encounter (HOSPITAL_COMMUNITY): Payer: Self-pay

## 2015-09-22 NOTE — Telephone Encounter (Signed)
Patient given detailed instructions per Myocardial Perfusion Study Information Sheet for the test on 09/25/15 at 0745. Patient notified to arrive 15 minutes early and that it is imperative to arrive on time for appointment to keep from having the test rescheduled.  If you need to cancel or reschedule your appointment, please call the office within 24 hours of your appointment. Failure to do so may result in a cancellation of your appointment, and a $50 no show fee. Patient verbalized understanding.Kjell Brannen, [place]

## 2015-09-24 ENCOUNTER — Encounter (HOSPITAL_COMMUNITY)
Admission: RE | Admit: 2015-09-24 | Discharge: 2015-09-24 | Disposition: A | Discharge status: Home / Self Care | Payer: Self-pay | Source: Ambulatory Visit | Attending: Cardiology | Admitting: Cardiology

## 2015-09-25 ENCOUNTER — Ambulatory Visit (HOSPITAL_COMMUNITY): Payer: BLUE CROSS/BLUE SHIELD | Attending: Cardiology

## 2015-09-25 DIAGNOSIS — I10 Essential (primary) hypertension: Secondary | ICD-10-CM | POA: Diagnosis not present

## 2015-09-25 DIAGNOSIS — R079 Chest pain, unspecified: Secondary | ICD-10-CM | POA: Diagnosis not present

## 2015-09-25 DIAGNOSIS — I2510 Atherosclerotic heart disease of native coronary artery without angina pectoris: Secondary | ICD-10-CM | POA: Diagnosis not present

## 2015-09-25 DIAGNOSIS — R0609 Other forms of dyspnea: Secondary | ICD-10-CM | POA: Insufficient documentation

## 2015-09-25 DIAGNOSIS — Z9861 Coronary angioplasty status: Secondary | ICD-10-CM

## 2015-09-25 LAB — MYOCARDIAL PERFUSION IMAGING
Estimated workload: 10.1 METS
Exercise duration (min): 9 min
Exercise duration (sec): 0 s
LV dias vol: 127 mL (ref 62–150)
LV sys vol: 62 mL
MPHR: 157 {beats}/min
Peak HR: 136 {beats}/min
Percent HR: 86 %
RATE: 0.31
Rest HR: 52 {beats}/min
SDS: 0
SRS: 0
SSS: 0
TID: 1.02

## 2015-09-25 MED ORDER — TECHNETIUM TC 99M SESTAMIBI GENERIC - CARDIOLITE
32.1000 | Freq: Once | INTRAVENOUS | Status: AC | PRN
  Administered 2015-09-25: 32.1 via INTRAVENOUS

## 2015-09-25 MED ORDER — TECHNETIUM TC 99M SESTAMIBI GENERIC - CARDIOLITE
11.0000 | Freq: Once | INTRAVENOUS | Status: AC | PRN
  Administered 2015-09-25: 11 via INTRAVENOUS

## 2015-09-26 ENCOUNTER — Encounter (HOSPITAL_COMMUNITY)
Admission: RE | Admit: 2015-09-26 | Discharge: 2015-09-26 | Disposition: A | Discharge status: Home / Self Care | Payer: Self-pay | Source: Ambulatory Visit | Attending: Cardiology | Admitting: Cardiology

## 2015-09-29 ENCOUNTER — Encounter (HOSPITAL_COMMUNITY)
Admission: RE | Admit: 2015-09-29 | Discharge: 2015-09-29 | Disposition: A | Discharge status: Home / Self Care | Payer: Self-pay | Source: Ambulatory Visit | Attending: Cardiology | Admitting: Cardiology

## 2015-09-29 DIAGNOSIS — Z48812 Encounter for surgical aftercare following surgery on the circulatory system: Secondary | ICD-10-CM | POA: Insufficient documentation

## 2015-09-29 DIAGNOSIS — Z955 Presence of coronary angioplasty implant and graft: Secondary | ICD-10-CM | POA: Insufficient documentation

## 2015-10-01 ENCOUNTER — Encounter (HOSPITAL_COMMUNITY)
Admission: RE | Admit: 2015-10-01 | Discharge: 2015-10-01 | Disposition: A | Discharge status: Home / Self Care | Payer: Self-pay | Source: Ambulatory Visit | Attending: Cardiology | Admitting: Cardiology

## 2015-10-03 ENCOUNTER — Encounter (HOSPITAL_COMMUNITY)
Admission: RE | Admit: 2015-10-03 | Discharge: 2015-10-03 | Disposition: A | Discharge status: Home / Self Care | Payer: Self-pay | Source: Ambulatory Visit | Attending: Cardiology | Admitting: Cardiology

## 2015-10-06 ENCOUNTER — Encounter (HOSPITAL_COMMUNITY)
Admission: RE | Admit: 2015-10-06 | Discharge: 2015-10-06 | Disposition: A | Discharge status: Home / Self Care | Payer: Self-pay | Source: Ambulatory Visit | Attending: Cardiology | Admitting: Cardiology

## 2015-10-07 ENCOUNTER — Other Ambulatory Visit: Payer: Self-pay | Admitting: Cardiology

## 2015-10-08 ENCOUNTER — Encounter (HOSPITAL_COMMUNITY)
Admission: RE | Admit: 2015-10-08 | Discharge: 2015-10-08 | Disposition: A | Discharge status: Home / Self Care | Payer: Self-pay | Source: Ambulatory Visit | Attending: Cardiology | Admitting: Cardiology

## 2015-10-10 ENCOUNTER — Encounter (HOSPITAL_COMMUNITY)
Admission: RE | Admit: 2015-10-10 | Discharge: 2015-10-10 | Disposition: A | Discharge status: Home / Self Care | Payer: BLUE CROSS/BLUE SHIELD | Source: Ambulatory Visit | Attending: Cardiology | Admitting: Cardiology

## 2015-10-13 ENCOUNTER — Encounter (HOSPITAL_COMMUNITY)
Admission: RE | Admit: 2015-10-13 | Discharge: 2015-10-13 | Disposition: A | Discharge status: Home / Self Care | Payer: Self-pay | Source: Ambulatory Visit | Attending: Cardiology | Admitting: Cardiology

## 2015-10-14 ENCOUNTER — Other Ambulatory Visit (INDEPENDENT_AMBULATORY_CARE_PROVIDER_SITE_OTHER): Payer: BLUE CROSS/BLUE SHIELD | Admitting: *Deleted

## 2015-10-14 DIAGNOSIS — E785 Hyperlipidemia, unspecified: Secondary | ICD-10-CM | POA: Diagnosis not present

## 2015-10-14 DIAGNOSIS — I10 Essential (primary) hypertension: Secondary | ICD-10-CM | POA: Diagnosis not present

## 2015-10-14 LAB — BASIC METABOLIC PANEL
BUN: 19 mg/dL (ref 7–25)
CO2: 27 mmol/L (ref 20–31)
Calcium: 9 mg/dL (ref 8.6–10.3)
Chloride: 101 mmol/L (ref 98–110)
Creat: 1.07 mg/dL (ref 0.70–1.25)
Glucose, Bld: 87 mg/dL (ref 65–99)
Potassium: 4.3 mmol/L (ref 3.5–5.3)
Sodium: 138 mmol/L (ref 135–146)

## 2015-10-14 LAB — LIPID PANEL
Cholesterol: 129 mg/dL (ref 125–200)
HDL: 31 mg/dL — ABNORMAL LOW (ref >=40)
LDL Cholesterol: 58 mg/dL (ref <130)
Total CHOL/HDL Ratio: 4.2 Ratio (ref <=5.0)
Triglycerides: 201 mg/dL — ABNORMAL HIGH (ref <150)
VLDL: 40 mg/dL — ABNORMAL HIGH (ref <30)

## 2015-10-14 LAB — HEPATIC FUNCTION PANEL
ALT: 15 U/L (ref 9–46)
AST: 15 U/L (ref 10–35)
Albumin: 4.3 g/dL (ref 3.6–5.1)
Alkaline Phosphatase: 51 U/L (ref 40–115)
Bilirubin, Direct: 0.1 mg/dL (ref <=0.2)
Indirect Bilirubin: 0.4 mg/dL (ref 0.2–1.2)
Total Bilirubin: 0.5 mg/dL (ref 0.2–1.2)
Total Protein: 6.8 g/dL (ref 6.1–8.1)

## 2015-10-14 NOTE — Addendum Note (Signed)
Addended by: Eulis Foster on: 10/14/2015 08:49 AM   Modules accepted: Orders

## 2015-10-15 ENCOUNTER — Encounter (HOSPITAL_COMMUNITY)
Admission: RE | Admit: 2015-10-15 | Discharge: 2015-10-15 | Disposition: A | Discharge status: Home / Self Care | Payer: Self-pay | Source: Ambulatory Visit | Attending: Cardiology | Admitting: Cardiology

## 2015-10-17 ENCOUNTER — Encounter (HOSPITAL_COMMUNITY): Payer: Self-pay

## 2015-10-20 ENCOUNTER — Encounter (HOSPITAL_COMMUNITY)
Admission: RE | Admit: 2015-10-20 | Discharge: 2015-10-20 | Disposition: A | Discharge status: Home / Self Care | Payer: Self-pay | Source: Ambulatory Visit | Attending: Cardiology | Admitting: Cardiology

## 2015-10-22 ENCOUNTER — Encounter (HOSPITAL_COMMUNITY): Payer: Self-pay

## 2015-10-24 ENCOUNTER — Encounter (HOSPITAL_COMMUNITY)
Admission: RE | Admit: 2015-10-24 | Discharge: 2015-10-24 | Disposition: A | Discharge status: Home / Self Care | Payer: Self-pay | Source: Ambulatory Visit | Attending: Cardiology | Admitting: Cardiology

## 2015-10-29 ENCOUNTER — Encounter (HOSPITAL_COMMUNITY)
Admission: RE | Admit: 2015-10-29 | Discharge: 2015-10-29 | Disposition: A | Discharge status: Home / Self Care | Payer: Self-pay | Source: Ambulatory Visit | Attending: Cardiology | Admitting: Cardiology

## 2015-10-31 ENCOUNTER — Encounter (HOSPITAL_COMMUNITY)
Admission: RE | Admit: 2015-10-31 | Discharge: 2015-10-31 | Disposition: A | Discharge status: Home / Self Care | Payer: Self-pay | Source: Ambulatory Visit | Attending: Cardiology | Admitting: Cardiology

## 2015-10-31 DIAGNOSIS — Z48812 Encounter for surgical aftercare following surgery on the circulatory system: Secondary | ICD-10-CM | POA: Insufficient documentation

## 2015-10-31 DIAGNOSIS — Z955 Presence of coronary angioplasty implant and graft: Secondary | ICD-10-CM | POA: Insufficient documentation

## 2015-11-03 ENCOUNTER — Encounter (HOSPITAL_COMMUNITY)
Admission: RE | Admit: 2015-11-03 | Discharge: 2015-11-03 | Disposition: A | Discharge status: Home / Self Care | Payer: Self-pay | Source: Ambulatory Visit | Attending: Cardiology | Admitting: Cardiology

## 2015-11-05 ENCOUNTER — Encounter (HOSPITAL_COMMUNITY)
Admission: RE | Admit: 2015-11-05 | Discharge: 2015-11-05 | Disposition: A | Discharge status: Home / Self Care | Payer: Self-pay | Source: Ambulatory Visit | Attending: Cardiology | Admitting: Cardiology

## 2015-11-07 ENCOUNTER — Encounter (HOSPITAL_COMMUNITY): Payer: Self-pay

## 2015-11-10 ENCOUNTER — Encounter (HOSPITAL_COMMUNITY): Payer: Self-pay

## 2015-11-11 ENCOUNTER — Other Ambulatory Visit: Payer: Self-pay | Admitting: Cardiology

## 2015-11-12 ENCOUNTER — Encounter (HOSPITAL_COMMUNITY)
Admission: RE | Admit: 2015-11-12 | Discharge: 2015-11-12 | Disposition: A | Discharge status: Home / Self Care | Payer: Self-pay | Source: Ambulatory Visit | Attending: Cardiology | Admitting: Cardiology

## 2015-11-14 ENCOUNTER — Encounter (HOSPITAL_COMMUNITY): Payer: Self-pay

## 2015-11-17 ENCOUNTER — Encounter (HOSPITAL_COMMUNITY)
Admission: RE | Admit: 2015-11-17 | Discharge: 2015-11-17 | Disposition: A | Discharge status: Home / Self Care | Payer: Self-pay | Source: Ambulatory Visit | Attending: Cardiology | Admitting: Cardiology

## 2015-11-17 ENCOUNTER — Ambulatory Visit (HOSPITAL_COMMUNITY): Payer: BLUE CROSS/BLUE SHIELD | Attending: Internal Medicine

## 2015-11-17 ENCOUNTER — Other Ambulatory Visit: Payer: Self-pay

## 2015-11-17 DIAGNOSIS — I517 Cardiomegaly: Secondary | ICD-10-CM | POA: Diagnosis not present

## 2015-11-17 DIAGNOSIS — I77810 Thoracic aortic ectasia: Secondary | ICD-10-CM | POA: Diagnosis present

## 2015-11-17 DIAGNOSIS — I340 Nonrheumatic mitral (valve) insufficiency: Secondary | ICD-10-CM | POA: Diagnosis not present

## 2015-11-17 LAB — ECHOCARDIOGRAM COMPLETE
E decel time: 278 msec
E/e' ratio: 9.69
FS: 24 % — AB (ref 28–44)
IVS/LV PW RATIO, ED: 1.05
LA ID, A-P, ES: 38 mm
LA diam end sys: 38 mm
LA diam index: 1.62 cm/m2
LA vol A4C: 48 ml
LA vol index: 24.7 mL/m2
LA vol: 58 mL
LV E/e' medial: 9.69
LV E/e'average: 9.69
LV PW d: 11.3 mm — AB (ref 0.6–1.1)
LVOT SV: 78 mL
LVOT VTI: 20.5 cm
LVOT area: 3.8 cm2
LVOT diameter: 22 mm
LVOT peak vel: 106 cm/s
MV Dec: 278
MV pk A vel: 58.7 m/s
MV pk E vel: 49.9 m/s
P 1/2 time: 838 ms
Reg peak vel: 237 cm/s
TDI e' medial: 5.15
TR max vel: 237 cm/s

## 2015-11-18 ENCOUNTER — Telehealth: Payer: Self-pay

## 2015-11-18 DIAGNOSIS — I77810 Thoracic aortic ectasia: Secondary | ICD-10-CM

## 2015-11-18 NOTE — Telephone Encounter (Signed)
-----   Message from Sueanne Margarita, MD sent at 11/18/2015  3:12 PM EDT ----- Aortic root miimally dilated - repeat echo in 1 year

## 2015-11-18 NOTE — Telephone Encounter (Signed)
Informed patient of results and verbal understanding expressed.  Repeat ECHO ordered to be scheduled in 1 year.  Patient agrees with treatment plan.  Patient would like to know from Dr. Radford Pax if he can start lifting light weights at Rehab (he st she previously advised him not to lift). He understands Dr. Radford Pax is out for a couple weeks and may not get new recommendations until then. He requests a MyChart message with Dr. Theodosia Blender decision.

## 2015-11-19 ENCOUNTER — Encounter (HOSPITAL_COMMUNITY): Payer: Self-pay

## 2015-11-19 NOTE — Telephone Encounter (Signed)
Given his dilated aortic root would avoid weight lifting.  Ok to do aerobic exerise

## 2015-11-20 NOTE — Telephone Encounter (Signed)
Instructed patient to avoid weight lifting. He was grateful for call.

## 2015-11-21 ENCOUNTER — Encounter (HOSPITAL_COMMUNITY): Payer: Self-pay

## 2015-11-24 ENCOUNTER — Encounter (HOSPITAL_COMMUNITY)
Admission: RE | Admit: 2015-11-24 | Discharge: 2015-11-24 | Disposition: A | Discharge status: Home / Self Care | Payer: Self-pay | Source: Ambulatory Visit | Attending: Cardiology | Admitting: Cardiology

## 2015-11-26 ENCOUNTER — Encounter (HOSPITAL_COMMUNITY)
Admission: RE | Admit: 2015-11-26 | Discharge: 2015-11-26 | Disposition: A | Discharge status: Home / Self Care | Payer: Self-pay | Source: Ambulatory Visit | Attending: Cardiology | Admitting: Cardiology

## 2015-11-28 ENCOUNTER — Encounter (HOSPITAL_COMMUNITY)
Admission: RE | Admit: 2015-11-28 | Discharge: 2015-11-28 | Disposition: A | Discharge status: Home / Self Care | Payer: Self-pay | Source: Ambulatory Visit | Attending: Cardiology | Admitting: Cardiology

## 2015-12-01 ENCOUNTER — Encounter (HOSPITAL_COMMUNITY): Payer: Self-pay

## 2015-12-01 DIAGNOSIS — Z48812 Encounter for surgical aftercare following surgery on the circulatory system: Secondary | ICD-10-CM | POA: Insufficient documentation

## 2015-12-01 DIAGNOSIS — Z955 Presence of coronary angioplasty implant and graft: Secondary | ICD-10-CM | POA: Insufficient documentation

## 2015-12-03 ENCOUNTER — Encounter (HOSPITAL_COMMUNITY): Payer: Self-pay

## 2015-12-05 ENCOUNTER — Encounter (HOSPITAL_COMMUNITY): Payer: Self-pay

## 2015-12-08 ENCOUNTER — Encounter (HOSPITAL_COMMUNITY)
Admission: RE | Admit: 2015-12-08 | Discharge: 2015-12-08 | Disposition: A | Discharge status: Home / Self Care | Payer: Self-pay | Source: Ambulatory Visit | Attending: Cardiology | Admitting: Cardiology

## 2015-12-10 ENCOUNTER — Encounter (HOSPITAL_COMMUNITY): Payer: Self-pay

## 2015-12-12 ENCOUNTER — Encounter (HOSPITAL_COMMUNITY)
Admission: RE | Admit: 2015-12-12 | Discharge: 2015-12-12 | Disposition: A | Discharge status: Home / Self Care | Payer: Self-pay | Source: Ambulatory Visit | Attending: Cardiology | Admitting: Cardiology

## 2015-12-14 ENCOUNTER — Other Ambulatory Visit: Payer: Self-pay | Admitting: Cardiology

## 2015-12-15 ENCOUNTER — Encounter (HOSPITAL_COMMUNITY): Payer: Self-pay

## 2015-12-17 ENCOUNTER — Encounter (HOSPITAL_COMMUNITY): Payer: Self-pay

## 2015-12-19 ENCOUNTER — Encounter (HOSPITAL_COMMUNITY): Payer: Self-pay

## 2015-12-22 ENCOUNTER — Encounter (HOSPITAL_COMMUNITY)
Admission: RE | Admit: 2015-12-22 | Discharge: 2015-12-22 | Disposition: A | Discharge status: Home / Self Care | Payer: Self-pay | Source: Ambulatory Visit | Attending: Cardiology | Admitting: Cardiology

## 2015-12-24 ENCOUNTER — Encounter (HOSPITAL_COMMUNITY)
Admission: RE | Admit: 2015-12-24 | Discharge: 2015-12-24 | Disposition: A | Discharge status: Home / Self Care | Payer: Self-pay | Source: Ambulatory Visit | Attending: Cardiology | Admitting: Cardiology

## 2015-12-26 ENCOUNTER — Encounter (HOSPITAL_COMMUNITY)
Admission: RE | Admit: 2015-12-26 | Discharge: 2015-12-26 | Disposition: A | Discharge status: Home / Self Care | Payer: Self-pay | Source: Ambulatory Visit | Attending: Cardiology | Admitting: Cardiology

## 2015-12-29 ENCOUNTER — Encounter (HOSPITAL_COMMUNITY)
Admission: RE | Admit: 2015-12-29 | Discharge: 2015-12-29 | Disposition: A | Discharge status: Home / Self Care | Payer: Self-pay | Source: Ambulatory Visit | Attending: Cardiology | Admitting: Cardiology

## 2015-12-31 ENCOUNTER — Encounter (HOSPITAL_COMMUNITY)
Admission: RE | Admit: 2015-12-31 | Discharge: 2015-12-31 | Disposition: A | Discharge status: Home / Self Care | Payer: Self-pay | Source: Ambulatory Visit | Attending: Cardiology | Admitting: Cardiology

## 2015-12-31 DIAGNOSIS — Z48812 Encounter for surgical aftercare following surgery on the circulatory system: Secondary | ICD-10-CM | POA: Insufficient documentation

## 2015-12-31 DIAGNOSIS — Z955 Presence of coronary angioplasty implant and graft: Secondary | ICD-10-CM | POA: Insufficient documentation

## 2016-01-02 ENCOUNTER — Encounter (HOSPITAL_COMMUNITY): Payer: Self-pay

## 2016-01-05 ENCOUNTER — Encounter (HOSPITAL_COMMUNITY)
Admission: RE | Admit: 2016-01-05 | Discharge: 2016-01-05 | Disposition: A | Discharge status: Home / Self Care | Payer: Self-pay | Source: Ambulatory Visit | Attending: Cardiology | Admitting: Cardiology

## 2016-01-07 ENCOUNTER — Encounter (HOSPITAL_COMMUNITY): Payer: Self-pay

## 2016-01-09 ENCOUNTER — Encounter (HOSPITAL_COMMUNITY): Payer: Self-pay

## 2016-01-12 ENCOUNTER — Encounter (HOSPITAL_COMMUNITY)
Admission: RE | Admit: 2016-01-12 | Discharge: 2016-01-12 | Disposition: A | Discharge status: Home / Self Care | Payer: Self-pay | Source: Ambulatory Visit | Attending: Cardiology | Admitting: Cardiology

## 2016-01-14 ENCOUNTER — Encounter (HOSPITAL_COMMUNITY)
Admission: RE | Admit: 2016-01-14 | Discharge: 2016-01-14 | Disposition: A | Discharge status: Home / Self Care | Payer: Self-pay | Source: Ambulatory Visit | Attending: Cardiology | Admitting: Cardiology

## 2016-01-16 ENCOUNTER — Encounter (HOSPITAL_COMMUNITY)
Admission: RE | Admit: 2016-01-16 | Discharge: 2016-01-16 | Disposition: A | Discharge status: Home / Self Care | Payer: Self-pay | Source: Ambulatory Visit | Attending: Cardiology | Admitting: Cardiology

## 2016-01-18 ENCOUNTER — Other Ambulatory Visit: Payer: Self-pay | Admitting: Cardiology

## 2016-01-19 ENCOUNTER — Encounter (HOSPITAL_COMMUNITY): Payer: Self-pay

## 2016-01-21 ENCOUNTER — Encounter (HOSPITAL_COMMUNITY)
Admission: RE | Admit: 2016-01-21 | Discharge: 2016-01-21 | Disposition: A | Discharge status: Home / Self Care | Payer: Self-pay | Source: Ambulatory Visit | Attending: Cardiology | Admitting: Cardiology

## 2016-01-23 ENCOUNTER — Encounter (HOSPITAL_COMMUNITY): Payer: Self-pay

## 2016-01-26 ENCOUNTER — Encounter (HOSPITAL_COMMUNITY)
Admission: RE | Admit: 2016-01-26 | Discharge: 2016-01-26 | Disposition: A | Discharge status: Home / Self Care | Payer: Self-pay | Source: Ambulatory Visit | Attending: Cardiology | Admitting: Cardiology

## 2016-01-28 ENCOUNTER — Encounter (HOSPITAL_COMMUNITY)
Admission: RE | Admit: 2016-01-28 | Discharge: 2016-01-28 | Disposition: A | Discharge status: Home / Self Care | Payer: Self-pay | Source: Ambulatory Visit | Attending: Cardiology | Admitting: Cardiology

## 2016-01-30 ENCOUNTER — Encounter (HOSPITAL_COMMUNITY): Payer: Self-pay

## 2016-01-30 DIAGNOSIS — Z48812 Encounter for surgical aftercare following surgery on the circulatory system: Secondary | ICD-10-CM | POA: Insufficient documentation

## 2016-01-30 DIAGNOSIS — Z955 Presence of coronary angioplasty implant and graft: Secondary | ICD-10-CM | POA: Insufficient documentation

## 2016-02-04 ENCOUNTER — Encounter (HOSPITAL_COMMUNITY)
Admission: RE | Admit: 2016-02-04 | Discharge: 2016-02-04 | Disposition: A | Discharge status: Home / Self Care | Payer: Self-pay | Source: Ambulatory Visit | Attending: Cardiology | Admitting: Cardiology

## 2016-02-06 ENCOUNTER — Encounter (HOSPITAL_COMMUNITY)
Admission: RE | Admit: 2016-02-06 | Discharge: 2016-02-06 | Disposition: A | Discharge status: Home / Self Care | Payer: Self-pay | Source: Ambulatory Visit | Attending: Cardiology | Admitting: Cardiology

## 2016-02-09 ENCOUNTER — Encounter (HOSPITAL_COMMUNITY)
Admission: RE | Admit: 2016-02-09 | Discharge: 2016-02-09 | Disposition: A | Discharge status: Home / Self Care | Payer: Self-pay | Source: Ambulatory Visit | Attending: Cardiology | Admitting: Cardiology

## 2016-02-11 ENCOUNTER — Encounter (HOSPITAL_COMMUNITY): Payer: Self-pay

## 2016-02-13 ENCOUNTER — Encounter (HOSPITAL_COMMUNITY)
Admission: RE | Admit: 2016-02-13 | Discharge: 2016-02-13 | Disposition: A | Discharge status: Home / Self Care | Payer: Self-pay | Source: Ambulatory Visit | Attending: Cardiology | Admitting: Cardiology

## 2016-02-16 ENCOUNTER — Encounter (HOSPITAL_COMMUNITY): Payer: Self-pay

## 2016-02-18 ENCOUNTER — Encounter (HOSPITAL_COMMUNITY)
Admission: RE | Admit: 2016-02-18 | Discharge: 2016-02-18 | Disposition: A | Discharge status: Home / Self Care | Payer: Self-pay | Source: Ambulatory Visit | Attending: Cardiology | Admitting: Cardiology

## 2016-02-20 ENCOUNTER — Encounter (HOSPITAL_COMMUNITY): Payer: Self-pay

## 2016-02-23 ENCOUNTER — Encounter (HOSPITAL_COMMUNITY): Payer: Self-pay

## 2016-02-25 ENCOUNTER — Encounter (HOSPITAL_COMMUNITY)
Admission: RE | Admit: 2016-02-25 | Discharge: 2016-02-25 | Disposition: A | Discharge status: Home / Self Care | Payer: Self-pay | Source: Ambulatory Visit | Attending: Cardiology | Admitting: Cardiology

## 2016-02-27 ENCOUNTER — Encounter (HOSPITAL_COMMUNITY): Payer: Self-pay

## 2016-03-01 ENCOUNTER — Encounter (HOSPITAL_COMMUNITY)
Admission: RE | Admit: 2016-03-01 | Discharge: 2016-03-01 | Disposition: A | Discharge status: Home / Self Care | Payer: Self-pay | Source: Ambulatory Visit | Attending: Cardiology | Admitting: Cardiology

## 2016-03-01 DIAGNOSIS — Z955 Presence of coronary angioplasty implant and graft: Secondary | ICD-10-CM | POA: Insufficient documentation

## 2016-03-01 DIAGNOSIS — Z48812 Encounter for surgical aftercare following surgery on the circulatory system: Secondary | ICD-10-CM | POA: Insufficient documentation

## 2016-03-03 ENCOUNTER — Encounter (HOSPITAL_COMMUNITY): Payer: Self-pay

## 2016-03-05 ENCOUNTER — Encounter (HOSPITAL_COMMUNITY)
Admission: RE | Admit: 2016-03-05 | Discharge: 2016-03-05 | Disposition: A | Discharge status: Home / Self Care | Payer: Self-pay | Source: Ambulatory Visit | Attending: Cardiology | Admitting: Cardiology

## 2016-03-08 ENCOUNTER — Encounter (HOSPITAL_COMMUNITY)
Admission: RE | Admit: 2016-03-08 | Discharge: 2016-03-08 | Disposition: A | Discharge status: Home / Self Care | Payer: Self-pay | Source: Ambulatory Visit | Attending: Cardiology | Admitting: Cardiology

## 2016-03-10 ENCOUNTER — Encounter (HOSPITAL_COMMUNITY)
Admission: RE | Admit: 2016-03-10 | Discharge: 2016-03-10 | Disposition: A | Discharge status: Home / Self Care | Payer: Self-pay | Source: Ambulatory Visit | Attending: Cardiology | Admitting: Cardiology

## 2016-03-12 ENCOUNTER — Encounter (HOSPITAL_COMMUNITY)
Admission: RE | Admit: 2016-03-12 | Discharge: 2016-03-12 | Disposition: A | Discharge status: Home / Self Care | Payer: Self-pay | Source: Ambulatory Visit | Attending: Cardiology | Admitting: Cardiology

## 2016-03-15 ENCOUNTER — Encounter (HOSPITAL_COMMUNITY)
Admission: RE | Admit: 2016-03-15 | Discharge: 2016-03-15 | Disposition: A | Discharge status: Home / Self Care | Payer: Self-pay | Source: Ambulatory Visit | Attending: Cardiology | Admitting: Cardiology

## 2016-03-17 ENCOUNTER — Encounter (HOSPITAL_COMMUNITY)
Admission: RE | Admit: 2016-03-17 | Discharge: 2016-03-17 | Disposition: A | Discharge status: Home / Self Care | Payer: Self-pay | Source: Ambulatory Visit | Attending: Cardiology | Admitting: Cardiology

## 2016-03-17 ENCOUNTER — Ambulatory Visit (INDEPENDENT_AMBULATORY_CARE_PROVIDER_SITE_OTHER): Payer: BLUE CROSS/BLUE SHIELD | Admitting: Cardiology

## 2016-03-17 ENCOUNTER — Encounter: Payer: Self-pay | Admitting: Cardiology

## 2016-03-17 VITALS — BP 146/94 | HR 59 | Ht 71.0 in | Wt 258.1 lb

## 2016-03-17 DIAGNOSIS — Z9861 Coronary angioplasty status: Secondary | ICD-10-CM

## 2016-03-17 DIAGNOSIS — I10 Essential (primary) hypertension: Secondary | ICD-10-CM

## 2016-03-17 DIAGNOSIS — I77810 Thoracic aortic ectasia: Secondary | ICD-10-CM | POA: Diagnosis not present

## 2016-03-17 DIAGNOSIS — E785 Hyperlipidemia, unspecified: Secondary | ICD-10-CM | POA: Diagnosis not present

## 2016-03-17 DIAGNOSIS — I2510 Atherosclerotic heart disease of native coronary artery without angina pectoris: Secondary | ICD-10-CM | POA: Diagnosis not present

## 2016-03-17 NOTE — Patient Instructions (Signed)

## 2016-03-17 NOTE — Progress Notes (Signed)
Cardiology Office Note    Date:  03/17/2016   ID:  [name], DOB 29-Mar-1951, MRN [id number]  PCP:  Gennette Pac, MD  Cardiologist:  Fransico Him, MD   Chief Complaint  Patient presents with  . Coronary Artery Disease  . Hypertension  . Hyperlipidemia    History of Present Illness:  Blake Carter is a 65 y.o. male with a history of HTN, dyslipidemia, dilated aortic root, severe 2 vessel ASCAD with 60-70% mid LAD with positive FFR and 90% distal left circ s/p DES to the LAD and distal LCX. He is on DAPT with ASA/Plavix. He denies anginal chest pain but has had some chest discomfort that he describes as a pinching senation over the left breast and right upper chest.  He thinks it may be related to allergies but is not sure. He had a nuclear stress test in April that showed no ischemia.   He has some DOE when going up stairs but has no problems in cardiac rehab. He says he is very active on the machines at rehab and has no chest discomfort or SOB.   He denies any LE edema, palpitations or syncope.    Past Medical History:  Diagnosis Date  . Allergic rhinitis   . Arthritis    "a little bit in some of my joints" (06/20/2014)  . Coronary artery disease    a. 05/2014 Cath/PCI: LM nl, LAD 40p, 60-70d (FFR 0.80->3.0x24 Synergy DES), D2 40, LCX 90d (2.25x12 Synergy DES), RCA 20-57m, EF 60-65%.  . Depression   . ED (erectile dysfunction)   . Environmental allergies    dry cough  . GERD (gastroesophageal reflux disease)   . History of hiatal hernia   . Hydronephrosis of right kidney    a. s/p ureteral stenting in the past.  . Hyperlipidemia    under control  . Hypertension   . Hypothyroidism   . OCD (obsessive compulsive disorder)   . Panic disorder   . PONV (postoperative nausea and vomiting)    after thyroid surgery no problems in 10 years   . Prostate cancer [facility])    prostate cancer- robotic prostatectomy - followed by radiation because of positive lymph  node--and pt on lupron injections  . Sinus bradycardia    usually in 50's  . SUI (stress urinary incontinence), male    S/P ROBOTIC PROSTATECTOMY AND RADIATION TX    Past Surgical History:  Procedure Laterality Date  . COLONOSCOPY  07/2013   Dr. Oletta Lamas  . CORONARY ANGIOPLASTY WITH STENT PLACEMENT  06/20/2014   "2"  . CYSTOSCOPY  11/30/2011   Procedure: CYSTOSCOPY;  Surgeon: Reece Packer, MD;  [facility];  Service: Urology;  Laterality: N/A;  Inplantation of Artificial Sphincter and Cystoscopy  . CYSTOSCOPY WITH RETROGRADE PYELOGRAM, URETEROSCOPY AND STENT PLACEMENT Right 02/11/2014   Procedure: CYSTOSCOPY WITH RETROGRADE PYELOGRAM, URETEROSCOPY ,URETERAL BIOPSY AND STENT PLACEMENT;  Surgeon: Raynelle Bring, MD;  [facility];  Service: Urology;  Laterality: Right;  . CYSTOSCOPY WITH RETROGRADE PYELOGRAM, URETEROSCOPY AND STENT PLACEMENT Right 04/15/2014   Procedure: CYSTOSCOPY WITH RETROGRADE PYELOGRAM, URETEROSCOPY, BIOPSY AND STENT PLACEMENT;  Surgeon: Raynelle Bring, MD;  [facility];  Service: Urology;  Laterality: Right;  . LEFT HEART CATHETERIZATION WITH CORONARY ANGIOGRAM N/A 06/20/2014   Procedure: LEFT HEART CATHETERIZATION WITH CORONARY ANGIOGRAM;  Surgeon: Leonie Man, MD;  Location: [facility] CATH LAB;  Service: Cardiovascular;  Laterality: N/A;  . PERCUTANEOUS CORONARY STENT INTERVENTION (PCI-S)  06/20/2014   Procedure: PERCUTANEOUS  CORONARY STENT INTERVENTION (PCI-S);  Surgeon: Leonie Man, MD;  Location[facility] C[facility] LAB;  Service: Cardiovascular;;  LAD and Circumflex  . REFRACTIVE SURGERY Bilateral 2004  . ROBOT ASSISTED LAPAROSCOPIC RADICAL PROSTATECTOMY  05/2010  . THYROIDECTOMY, PARTIAL  10/1974  . TONSILLECTOMY  1956   . TOTAL THYROIDECTOMY  10/2012   "nuked it"  . UPPER GI ENDOSCOPY     food impaction 2009 done by Dr Oletta Lamas  . URINARY SPHINCTER IMPLANT  11/30/2011   Procedure: ARTIFICIAL URINARY SPHINCTER;  Surgeon: Reece Packer, MD;  [facility];   Service: Urology;  Laterality: N/A;    Current Medications: Outpatient Medications Prior to Visit  Medication Sig Dispense Refill  . aspirin EC 81 MG tablet Take 81 mg by mouth daily.    Marland Kitchen atorvastatin (LIPITOR) 40 MG tablet TAKE 1 TABLET EVERY MORNING 90 tablet 1  . clopidogrel (PLAVIX) 75 MG tablet Take 75 mg by mouth daily.    . diazepam (VALIUM) 5 MG tablet Take 5 mg by mouth every 6 (six) hours as needed for anxiety.    . fluvoxaMINE (LUVOX) 50 MG tablet Take 150 mg by mouth every morning.     Marland Kitchen gemfibrozil (LOPID) 600 MG tablet Take 600 mg by mouth 2 (two) times daily.     Marland Kitchen latanoprost (XALATAN) 0.005 % ophthalmic solution Place 1 drop into both eyes at bedtime.     Marland Kitchen levothyroxine (SYNTHROID, LEVOTHROID) 175 MCG tablet Take 175 mcg by mouth daily before breakfast.    . lisinopril (PRINIVIL,ZESTRIL) 10 MG tablet Take 1 tablet (10 mg total) by mouth every morning. 90 tablet 3  . NITROSTAT 0.4 MG SL tablet PLACE 1 TABLET (0.4 MG TOTAL) UNDER THE TONGUE EVERY 5 (FIVE) MINUTES AS NEEDED FOR CHEST PAIN. 25 tablet 1  . pantoprazole (PROTONIX) 40 MG tablet TAKE 2 TABLETS DAILY 180 tablet 1  . zolpidem (AMBIEN) 10 MG tablet Take 10 mg by mouth at bedtime as needed for sleep.     . clopidogrel (PLAVIX) 75 MG tablet TAKE 1 TABLET DAILY WITH BREAKFAST 90 tablet 2  . pantoprazole (PROTONIX) 40 MG tablet Take 40 mg by mouth 2 (two) times daily.     No facility-administered medications prior to visit.      Allergies:   Niacin and related and Tagamet [cimetidine]   Social History   Social History  . Marital status: Married    Spouse name: N/A  . Number of children: N/A  . Years of education: N/A   Social History Main Topics  . Smoking status: Never Smoker  . Smokeless tobacco: Never Used  . Alcohol use 0.0 oz/week     Comment: 06/20/2014 "1-2 drinks a couple times/month"  . Drug use: No  . Sexual activity: Yes   Other Topics Concern  . None   Social History Narrative  . None       Family History:  The patient's family history includes CAD in his brother; Cancer in his father; Heart disease in his father; Hyperlipidemia in his father.   ROS:   Please see the history of present illness.    ROS All other systems reviewed and are negative.  No flowsheet data found.     PHYSICAL EXAM:   VS:  BP (!) 146/94   Pulse (!) 59   Ht 5\' 11"  (1.803 m)   Wt 258 lb 1.9 oz (117.1 kg)   SpO2 96%   BMI 36.00 kg/m    GEN: Well nourished, well  developed, in no acute distress  HEENT: normal  Neck: no JVD, carotid bruits, or masses Cardiac: RRR; no murmurs, rubs, or gallops,no edema.  Intact distal pulses bilaterally.  Respiratory:  clear to auscultation bilaterally, normal work of breathing GI: soft, nontender, nondistended, + BS MS: no deformity or atrophy  Skin: warm and dry, no rash Neuro:  Alert and Oriented x 3, Strength and sensation are intact Psych: euthymic mood, full affect  Wt Readings from Last 3 Encounters:  03/17/16 258 lb 1.9 oz (117.1 kg)  09/25/15 257 lb (116.6 kg)  09/16/15 257 lb 12.8 oz (116.9 kg)      Studies/Labs Reviewed:   EKG:  EKG is not ordered today.    Recent Labs: 10/14/2015: ALT 15; BUN 19; Creat 1.07; Potassium 4.3; Sodium 138   Lipid Panel    Component Value Date/Time   CHOL 129 10/14/2015 0850   TRIG 201 (H) 10/14/2015 0850   HDL 31 (L) 10/14/2015 0850   CHOLHDL 4.2 10/14/2015 0850   VLDL 40 (H) 10/14/2015 0850   LDLCALC 58 10/14/2015 0850    Additional studies/ records that were reviewed today include:  none    ASSESSMENT:    1. CAD S/P percutaneous coronary angioplasty   2. Essential hypertension   3. Dilated aortic root ([place])   4. Dyslipidemia      PLAN:  In order of problems listed above:  1. ASCAD with severe 2 vessel ASCAD with 60-70% mid LAD with positive FFR and 90% distal left circ s/p DES to the LAD and distal LCX.He has not had any angina.  He has some atypical chest discomfort that sounds  musculoskeletal pain and had normal stress test 08/2015.  Continue DAPT with ASA and Plavix.  Continue statin. 2. HTN -BP borderline controlled today but is normal in cardiac rehab on current meds. Continue ACE I.  3. Dilated aortic root - 93mm by echo 10/2015.  Repeat 10/2016. 4. Dyslipidemia LDL goal < 70.  Continue statin and lopid.      Medication Adjustments/Labs and Tests Ordered: Current medicines are reviewed at length with the patient today.  Concerns regarding medicines are outlined above.  Medication changes, Labs and Tests ordered today are listed in the Patient Instructions below.  There are no Patient Instructions on file for this visit.   Signed, Fransico Him, MD  03/17/2016 1:42 PM    [facility] [address], [place], [place]  [address] Phone: [phone]; Fax: [phone]

## 2016-03-19 ENCOUNTER — Encounter (HOSPITAL_COMMUNITY): Payer: Self-pay

## 2016-03-22 ENCOUNTER — Encounter (HOSPITAL_COMMUNITY): Payer: Self-pay

## 2016-03-24 ENCOUNTER — Encounter (HOSPITAL_COMMUNITY)
Admission: RE | Admit: 2016-03-24 | Discharge: 2016-03-24 | Disposition: A | Discharge status: Home / Self Care | Payer: Self-pay | Source: Ambulatory Visit | Attending: Cardiology | Admitting: Cardiology

## 2016-03-26 ENCOUNTER — Encounter (HOSPITAL_COMMUNITY): Payer: Self-pay

## 2016-03-29 ENCOUNTER — Encounter (HOSPITAL_COMMUNITY)
Admission: RE | Admit: 2016-03-29 | Discharge: 2016-03-29 | Disposition: A | Discharge status: Home / Self Care | Payer: Self-pay | Source: Ambulatory Visit | Attending: Cardiology | Admitting: Cardiology

## 2016-03-31 ENCOUNTER — Encounter (HOSPITAL_COMMUNITY): Payer: Self-pay

## 2016-03-31 DIAGNOSIS — Z48812 Encounter for surgical aftercare following surgery on the circulatory system: Secondary | ICD-10-CM | POA: Insufficient documentation

## 2016-03-31 DIAGNOSIS — Z955 Presence of coronary angioplasty implant and graft: Secondary | ICD-10-CM | POA: Insufficient documentation

## 2016-04-02 ENCOUNTER — Encounter (HOSPITAL_COMMUNITY)
Admission: RE | Admit: 2016-04-02 | Discharge: 2016-04-02 | Disposition: A | Discharge status: Home / Self Care | Payer: Self-pay | Source: Ambulatory Visit | Attending: Cardiology | Admitting: Cardiology

## 2016-04-05 ENCOUNTER — Encounter (HOSPITAL_COMMUNITY)
Admission: RE | Admit: 2016-04-05 | Discharge: 2016-04-05 | Disposition: A | Discharge status: Home / Self Care | Payer: Self-pay | Source: Ambulatory Visit | Attending: Cardiology | Admitting: Cardiology

## 2016-04-07 ENCOUNTER — Encounter (HOSPITAL_COMMUNITY)
Admission: RE | Admit: 2016-04-07 | Discharge: 2016-04-07 | Disposition: A | Discharge status: Home / Self Care | Payer: Self-pay | Source: Ambulatory Visit | Attending: Cardiology | Admitting: Cardiology

## 2016-04-09 ENCOUNTER — Encounter (HOSPITAL_COMMUNITY): Payer: Self-pay

## 2016-04-12 ENCOUNTER — Encounter (HOSPITAL_COMMUNITY)
Admission: RE | Admit: 2016-04-12 | Discharge: 2016-04-12 | Disposition: A | Discharge status: Home / Self Care | Payer: Self-pay | Source: Ambulatory Visit | Attending: Cardiology | Admitting: Cardiology

## 2016-04-14 ENCOUNTER — Encounter (HOSPITAL_COMMUNITY)
Admission: RE | Admit: 2016-04-14 | Discharge: 2016-04-14 | Disposition: A | Discharge status: Home / Self Care | Payer: Self-pay | Source: Ambulatory Visit | Attending: Cardiology | Admitting: Cardiology

## 2016-04-16 ENCOUNTER — Encounter (HOSPITAL_COMMUNITY): Payer: Self-pay

## 2016-04-19 ENCOUNTER — Encounter (HOSPITAL_COMMUNITY)
Admission: RE | Admit: 2016-04-19 | Discharge: 2016-04-19 | Disposition: A | Discharge status: Home / Self Care | Payer: Self-pay | Source: Ambulatory Visit | Attending: Cardiology | Admitting: Cardiology

## 2016-04-21 ENCOUNTER — Encounter: Payer: Self-pay | Admitting: Cardiology

## 2016-04-21 ENCOUNTER — Encounter (HOSPITAL_COMMUNITY): Payer: Self-pay

## 2016-04-26 ENCOUNTER — Encounter: Payer: Self-pay | Admitting: Cardiology

## 2016-04-26 ENCOUNTER — Encounter (HOSPITAL_COMMUNITY)
Admission: RE | Admit: 2016-04-26 | Discharge: 2016-04-26 | Disposition: A | Discharge status: Home / Self Care | Payer: Self-pay | Source: Ambulatory Visit | Attending: Cardiology | Admitting: Cardiology

## 2016-04-28 ENCOUNTER — Other Ambulatory Visit: Payer: Self-pay

## 2016-04-28 ENCOUNTER — Encounter (HOSPITAL_COMMUNITY)
Admission: RE | Admit: 2016-04-28 | Discharge: 2016-04-28 | Disposition: A | Discharge status: Home / Self Care | Payer: Self-pay | Source: Ambulatory Visit | Attending: Cardiology | Admitting: Cardiology

## 2016-04-30 ENCOUNTER — Encounter (HOSPITAL_COMMUNITY)
Admission: RE | Admit: 2016-04-30 | Discharge: 2016-04-30 | Disposition: A | Discharge status: Home / Self Care | Payer: Self-pay | Source: Ambulatory Visit | Attending: Cardiology | Admitting: Cardiology

## 2016-04-30 DIAGNOSIS — I2510 Atherosclerotic heart disease of native coronary artery without angina pectoris: Secondary | ICD-10-CM | POA: Insufficient documentation

## 2016-05-03 ENCOUNTER — Encounter (HOSPITAL_COMMUNITY): Payer: Self-pay

## 2016-05-05 ENCOUNTER — Encounter (HOSPITAL_COMMUNITY)
Admission: RE | Admit: 2016-05-05 | Discharge: 2016-05-05 | Disposition: A | Discharge status: Home / Self Care | Payer: Self-pay | Source: Ambulatory Visit | Attending: Cardiology | Admitting: Cardiology

## 2016-05-07 ENCOUNTER — Encounter (HOSPITAL_COMMUNITY): Payer: Self-pay

## 2016-05-10 ENCOUNTER — Encounter (HOSPITAL_COMMUNITY)
Admission: RE | Admit: 2016-05-10 | Discharge: 2016-05-10 | Disposition: A | Discharge status: Home / Self Care | Payer: Self-pay | Source: Ambulatory Visit | Attending: Cardiology | Admitting: Cardiology

## 2016-05-11 ENCOUNTER — Other Ambulatory Visit: Payer: Self-pay | Admitting: Cardiology

## 2016-05-12 ENCOUNTER — Encounter (HOSPITAL_COMMUNITY): Payer: Self-pay

## 2016-05-14 ENCOUNTER — Encounter (HOSPITAL_COMMUNITY): Payer: Self-pay

## 2016-05-17 ENCOUNTER — Encounter (HOSPITAL_COMMUNITY): Payer: Self-pay

## 2016-05-19 ENCOUNTER — Encounter (HOSPITAL_COMMUNITY): Payer: Self-pay

## 2016-05-21 ENCOUNTER — Encounter (HOSPITAL_COMMUNITY): Payer: Self-pay

## 2016-05-25 ENCOUNTER — Other Ambulatory Visit: Payer: Self-pay | Admitting: Cardiology

## 2016-05-26 ENCOUNTER — Encounter (HOSPITAL_COMMUNITY)
Admission: RE | Admit: 2016-05-26 | Discharge: 2016-05-26 | Disposition: A | Discharge status: Home / Self Care | Payer: Self-pay | Source: Ambulatory Visit | Attending: Cardiology | Admitting: Cardiology

## 2016-05-28 ENCOUNTER — Encounter (HOSPITAL_COMMUNITY)
Admission: RE | Admit: 2016-05-28 | Discharge: 2016-05-28 | Disposition: A | Discharge status: Home / Self Care | Payer: Self-pay | Source: Ambulatory Visit | Attending: Cardiology | Admitting: Cardiology

## 2016-06-02 ENCOUNTER — Encounter (HOSPITAL_COMMUNITY)
Admission: RE | Admit: 2016-06-02 | Discharge: 2016-06-02 | Disposition: A | Discharge status: Home / Self Care | Payer: Self-pay | Source: Ambulatory Visit | Attending: Cardiology | Admitting: Cardiology

## 2016-06-02 DIAGNOSIS — I2510 Atherosclerotic heart disease of native coronary artery without angina pectoris: Secondary | ICD-10-CM | POA: Insufficient documentation

## 2016-06-04 ENCOUNTER — Encounter (HOSPITAL_COMMUNITY)
Admission: RE | Admit: 2016-06-04 | Discharge: 2016-06-04 | Disposition: A | Discharge status: Home / Self Care | Payer: Self-pay | Source: Ambulatory Visit | Attending: Cardiology | Admitting: Cardiology

## 2016-06-07 ENCOUNTER — Encounter (HOSPITAL_COMMUNITY): Payer: Self-pay

## 2016-06-09 ENCOUNTER — Encounter (HOSPITAL_COMMUNITY)
Admission: RE | Admit: 2016-06-09 | Discharge: 2016-06-09 | Disposition: A | Discharge status: Home / Self Care | Payer: Self-pay | Source: Ambulatory Visit | Attending: Cardiology | Admitting: Cardiology

## 2016-06-11 ENCOUNTER — Encounter (HOSPITAL_COMMUNITY)
Admission: RE | Admit: 2016-06-11 | Discharge: 2016-06-11 | Disposition: A | Discharge status: Home / Self Care | Payer: Self-pay | Source: Ambulatory Visit | Attending: Cardiology | Admitting: Cardiology

## 2016-06-14 ENCOUNTER — Encounter (HOSPITAL_COMMUNITY)
Admission: RE | Admit: 2016-06-14 | Discharge: 2016-06-14 | Disposition: A | Discharge status: Home / Self Care | Payer: Self-pay | Source: Ambulatory Visit | Attending: Cardiology | Admitting: Cardiology

## 2016-06-15 ENCOUNTER — Other Ambulatory Visit: Payer: Self-pay

## 2016-06-15 MED ORDER — ATORVASTATIN CALCIUM 40 MG PO TABS: 40.0000 mg | ORAL_TABLET | Freq: Every morning | ORAL | 1 refills | Status: DC

## 2016-06-15 MED ORDER — LISINOPRIL 10 MG PO TABS: 10.0000 mg | ORAL_TABLET | Freq: Every morning | ORAL | 1 refills | Status: DC

## 2016-06-16 ENCOUNTER — Encounter (HOSPITAL_COMMUNITY): Payer: Self-pay

## 2016-06-18 ENCOUNTER — Other Ambulatory Visit: Payer: Self-pay | Admitting: *Deleted

## 2016-06-18 ENCOUNTER — Encounter (HOSPITAL_COMMUNITY)
Admission: RE | Admit: 2016-06-18 | Discharge: 2016-06-18 | Disposition: A | Discharge status: Home / Self Care | Payer: Self-pay | Source: Ambulatory Visit | Attending: Cardiology | Admitting: Cardiology

## 2016-06-18 MED ORDER — PANTOPRAZOLE SODIUM 40 MG PO TBEC: 80.0000 mg | DELAYED_RELEASE_TABLET | Freq: Every day | ORAL | 1 refills | Status: DC

## 2016-06-18 MED ORDER — ATORVASTATIN CALCIUM 40 MG PO TABS
40.0000 mg | ORAL_TABLET | Freq: Every morning | ORAL | 1 refills | Status: DC
Start: 2016-06-18 — End: 2016-11-30

## 2016-06-18 MED ORDER — LISINOPRIL 10 MG PO TABS: 10.0000 mg | ORAL_TABLET | Freq: Every morning | ORAL | 1 refills | Status: DC

## 2016-06-21 ENCOUNTER — Encounter (HOSPITAL_COMMUNITY)
Admission: RE | Admit: 2016-06-21 | Discharge: 2016-06-21 | Disposition: A | Discharge status: Home / Self Care | Payer: Self-pay | Source: Ambulatory Visit | Attending: Cardiology | Admitting: Cardiology

## 2016-06-23 ENCOUNTER — Encounter (HOSPITAL_COMMUNITY): Payer: Self-pay

## 2016-06-25 ENCOUNTER — Encounter (HOSPITAL_COMMUNITY)
Admission: RE | Admit: 2016-06-25 | Discharge: 2016-06-25 | Disposition: A | Discharge status: Home / Self Care | Payer: Self-pay | Source: Ambulatory Visit | Attending: Cardiology | Admitting: Cardiology

## 2016-06-28 ENCOUNTER — Encounter (HOSPITAL_COMMUNITY)
Admission: RE | Admit: 2016-06-28 | Discharge: 2016-06-28 | Disposition: A | Discharge status: Home / Self Care | Payer: Self-pay | Source: Ambulatory Visit | Attending: Cardiology | Admitting: Cardiology

## 2016-06-30 ENCOUNTER — Encounter (HOSPITAL_COMMUNITY)
Admission: RE | Admit: 2016-06-30 | Discharge: 2016-06-30 | Disposition: A | Discharge status: Home / Self Care | Payer: Self-pay | Source: Ambulatory Visit | Attending: Cardiology | Admitting: Cardiology

## 2016-07-02 ENCOUNTER — Encounter (HOSPITAL_COMMUNITY): Payer: Self-pay

## 2016-07-02 DIAGNOSIS — I2510 Atherosclerotic heart disease of native coronary artery without angina pectoris: Secondary | ICD-10-CM | POA: Insufficient documentation

## 2016-07-05 ENCOUNTER — Encounter (HOSPITAL_COMMUNITY): Payer: Self-pay

## 2016-07-07 ENCOUNTER — Encounter (HOSPITAL_COMMUNITY)
Admission: RE | Admit: 2016-07-07 | Discharge: 2016-07-07 | Disposition: A | Discharge status: Home / Self Care | Payer: Self-pay | Source: Ambulatory Visit | Attending: Cardiology | Admitting: Cardiology

## 2016-07-07 NOTE — Progress Notes (Signed)
Pt in today for cardiac rehab maintenance program.  Pt admits to not feeling well since returning form [place] over the weekend.  Pt with cough and feeling tired. Lung sounds clear with wheezes lower lobes, non productive cough, temp 98.6, bp 144/82.  Pt advised not to exercise. Pt to call his primary md for appt tomorrow if unable to get an appt for tomorrow then he should go [place] [facility].  Pt verbalized understanding and is in agreement of this. Cherre Huger, BSN

## 2016-07-09 ENCOUNTER — Encounter (HOSPITAL_COMMUNITY): Payer: Self-pay

## 2016-07-12 ENCOUNTER — Encounter (HOSPITAL_COMMUNITY)
Admission: RE | Admit: 2016-07-12 | Discharge: 2016-07-12 | Disposition: A | Discharge status: Home / Self Care | Payer: Self-pay | Source: Ambulatory Visit | Attending: Cardiology | Admitting: Cardiology

## 2016-07-14 ENCOUNTER — Encounter (HOSPITAL_COMMUNITY)
Admission: RE | Admit: 2016-07-14 | Discharge: 2016-07-14 | Disposition: A | Discharge status: Home / Self Care | Payer: Self-pay | Source: Ambulatory Visit | Attending: Cardiology | Admitting: Cardiology

## 2016-07-16 ENCOUNTER — Encounter (HOSPITAL_COMMUNITY)
Admission: RE | Admit: 2016-07-16 | Discharge: 2016-07-16 | Disposition: A | Discharge status: Home / Self Care | Payer: Self-pay | Source: Ambulatory Visit | Attending: Cardiology | Admitting: Cardiology

## 2016-07-19 ENCOUNTER — Encounter (HOSPITAL_COMMUNITY)
Admission: RE | Admit: 2016-07-19 | Discharge: 2016-07-19 | Disposition: A | Discharge status: Home / Self Care | Payer: Self-pay | Source: Ambulatory Visit | Attending: Cardiology | Admitting: Cardiology

## 2016-07-21 ENCOUNTER — Encounter (HOSPITAL_COMMUNITY): Payer: Self-pay

## 2016-07-23 ENCOUNTER — Encounter (HOSPITAL_COMMUNITY)
Admission: RE | Admit: 2016-07-23 | Discharge: 2016-07-23 | Disposition: A | Discharge status: Home / Self Care | Payer: Self-pay | Source: Ambulatory Visit | Attending: Cardiology | Admitting: Cardiology

## 2016-07-26 ENCOUNTER — Encounter (HOSPITAL_COMMUNITY): Payer: Self-pay

## 2016-07-28 ENCOUNTER — Encounter (HOSPITAL_COMMUNITY): Payer: Self-pay

## 2016-07-30 ENCOUNTER — Encounter (HOSPITAL_COMMUNITY)
Admission: RE | Admit: 2016-07-30 | Discharge: 2016-07-30 | Disposition: A | Discharge status: Home / Self Care | Payer: Self-pay | Source: Ambulatory Visit | Attending: Cardiology | Admitting: Cardiology

## 2016-07-30 DIAGNOSIS — I2510 Atherosclerotic heart disease of native coronary artery without angina pectoris: Secondary | ICD-10-CM | POA: Insufficient documentation

## 2016-08-02 ENCOUNTER — Encounter (HOSPITAL_COMMUNITY): Payer: Self-pay

## 2016-08-04 ENCOUNTER — Encounter (HOSPITAL_COMMUNITY): Payer: Self-pay

## 2016-08-06 ENCOUNTER — Encounter (HOSPITAL_COMMUNITY): Payer: Self-pay

## 2016-08-09 ENCOUNTER — Encounter (HOSPITAL_COMMUNITY): Payer: Self-pay

## 2016-08-11 ENCOUNTER — Encounter (HOSPITAL_COMMUNITY)
Admission: RE | Admit: 2016-08-11 | Discharge: 2016-08-11 | Disposition: A | Discharge status: Home / Self Care | Payer: Self-pay | Source: Ambulatory Visit | Attending: Cardiology | Admitting: Cardiology

## 2016-08-13 ENCOUNTER — Encounter (HOSPITAL_COMMUNITY)
Admission: RE | Admit: 2016-08-13 | Discharge: 2016-08-13 | Disposition: A | Discharge status: Home / Self Care | Payer: Self-pay | Source: Ambulatory Visit | Attending: Cardiology | Admitting: Cardiology

## 2016-08-16 ENCOUNTER — Encounter (HOSPITAL_COMMUNITY)
Admission: RE | Admit: 2016-08-16 | Discharge: 2016-08-16 | Disposition: A | Discharge status: Home / Self Care | Payer: Self-pay | Source: Ambulatory Visit | Attending: Cardiology | Admitting: Cardiology

## 2016-08-18 ENCOUNTER — Encounter (HOSPITAL_COMMUNITY): Payer: Self-pay

## 2016-08-20 ENCOUNTER — Encounter (HOSPITAL_COMMUNITY): Payer: Self-pay

## 2016-08-23 ENCOUNTER — Encounter (HOSPITAL_COMMUNITY)
Admission: RE | Admit: 2016-08-23 | Discharge: 2016-08-23 | Disposition: A | Discharge status: Home / Self Care | Payer: Self-pay | Source: Ambulatory Visit | Attending: Cardiology | Admitting: Cardiology

## 2016-08-25 ENCOUNTER — Encounter (HOSPITAL_COMMUNITY): Payer: Self-pay

## 2016-08-27 ENCOUNTER — Encounter (HOSPITAL_COMMUNITY)
Admission: RE | Admit: 2016-08-27 | Discharge: 2016-08-27 | Disposition: A | Discharge status: Home / Self Care | Payer: Self-pay | Source: Ambulatory Visit | Attending: Cardiology | Admitting: Cardiology

## 2016-08-30 ENCOUNTER — Encounter (HOSPITAL_COMMUNITY)
Admission: RE | Admit: 2016-08-30 | Discharge: 2016-08-30 | Disposition: A | Discharge status: Home / Self Care | Payer: Self-pay | Source: Ambulatory Visit | Attending: Cardiology | Admitting: Cardiology

## 2016-08-30 ENCOUNTER — Encounter: Payer: Self-pay | Admitting: Cardiology

## 2016-08-30 DIAGNOSIS — I2510 Atherosclerotic heart disease of native coronary artery without angina pectoris: Secondary | ICD-10-CM | POA: Insufficient documentation

## 2016-09-01 ENCOUNTER — Encounter (HOSPITAL_COMMUNITY): Payer: Self-pay

## 2016-09-03 ENCOUNTER — Encounter (HOSPITAL_COMMUNITY): Payer: Self-pay

## 2016-09-06 ENCOUNTER — Encounter (HOSPITAL_COMMUNITY)
Admission: RE | Admit: 2016-09-06 | Discharge: 2016-09-06 | Disposition: A | Discharge status: Home / Self Care | Payer: Self-pay | Source: Ambulatory Visit | Attending: Cardiology | Admitting: Cardiology

## 2016-09-08 ENCOUNTER — Encounter (HOSPITAL_COMMUNITY): Payer: Self-pay

## 2016-09-10 ENCOUNTER — Encounter (HOSPITAL_COMMUNITY)
Admission: RE | Admit: 2016-09-10 | Discharge: 2016-09-10 | Disposition: A | Discharge status: Home / Self Care | Payer: Self-pay | Source: Ambulatory Visit | Attending: Cardiology | Admitting: Cardiology

## 2016-09-13 ENCOUNTER — Encounter (HOSPITAL_COMMUNITY)
Admission: RE | Admit: 2016-09-13 | Discharge: 2016-09-13 | Disposition: A | Discharge status: Home / Self Care | Payer: Self-pay | Source: Ambulatory Visit | Attending: Cardiology | Admitting: Cardiology

## 2016-09-14 ENCOUNTER — Ambulatory Visit: Payer: BLUE CROSS/BLUE SHIELD | Admitting: Cardiology

## 2016-09-15 ENCOUNTER — Encounter (HOSPITAL_COMMUNITY): Payer: Self-pay

## 2016-09-17 ENCOUNTER — Encounter (HOSPITAL_COMMUNITY)
Admission: RE | Admit: 2016-09-17 | Discharge: 2016-09-17 | Disposition: A | Discharge status: Home / Self Care | Payer: Self-pay | Source: Ambulatory Visit | Attending: Cardiology | Admitting: Cardiology

## 2016-09-20 ENCOUNTER — Encounter (HOSPITAL_COMMUNITY): Payer: Self-pay

## 2016-09-22 ENCOUNTER — Encounter (HOSPITAL_COMMUNITY): Payer: Self-pay

## 2016-09-24 ENCOUNTER — Encounter (HOSPITAL_COMMUNITY)
Admission: RE | Admit: 2016-09-24 | Discharge: 2016-09-24 | Disposition: A | Discharge status: Home / Self Care | Payer: Self-pay | Source: Ambulatory Visit | Attending: Cardiology | Admitting: Cardiology

## 2016-09-27 ENCOUNTER — Encounter (HOSPITAL_COMMUNITY)
Admission: RE | Admit: 2016-09-27 | Discharge: 2016-09-27 | Disposition: A | Discharge status: Home / Self Care | Payer: Self-pay | Source: Ambulatory Visit | Attending: Cardiology | Admitting: Cardiology

## 2016-09-29 ENCOUNTER — Encounter (HOSPITAL_COMMUNITY)
Admission: RE | Admit: 2016-09-29 | Discharge: 2016-09-29 | Disposition: A | Discharge status: Home / Self Care | Payer: Self-pay | Source: Ambulatory Visit | Attending: Cardiology | Admitting: Cardiology

## 2016-09-29 DIAGNOSIS — I2510 Atherosclerotic heart disease of native coronary artery without angina pectoris: Secondary | ICD-10-CM | POA: Insufficient documentation

## 2016-10-01 ENCOUNTER — Encounter (HOSPITAL_COMMUNITY): Payer: Self-pay

## 2016-10-04 ENCOUNTER — Encounter (HOSPITAL_COMMUNITY)
Admission: RE | Admit: 2016-10-04 | Discharge: 2016-10-04 | Disposition: A | Discharge status: Home / Self Care | Payer: Self-pay | Source: Ambulatory Visit | Attending: Cardiology | Admitting: Cardiology

## 2016-10-06 ENCOUNTER — Encounter (HOSPITAL_COMMUNITY): Payer: Self-pay

## 2016-10-08 ENCOUNTER — Encounter (HOSPITAL_COMMUNITY)
Admission: RE | Admit: 2016-10-08 | Discharge: 2016-10-08 | Disposition: A | Discharge status: Home / Self Care | Payer: Self-pay | Source: Ambulatory Visit | Attending: Cardiology | Admitting: Cardiology

## 2016-10-10 ENCOUNTER — Encounter: Payer: Self-pay | Admitting: Cardiology

## 2016-10-10 NOTE — Progress Notes (Signed)
Cardiology Office Note    Date:  10/11/2016   ID:  Blake Carter, DOB 04-Jun-1950, MRN [id number]  PCP:  Hulan Fess, MD  Cardiologist:  Fransico Him, MD   Chief Complaint  Patient presents with  . Coronary Artery Disease  . Hypertension  . Hyperlipidemia    History of Present Illness:  Blake Carter is a 66 y.o. male with a history of HTN, dyslipidemia, dilated aortic root, severe 2 vessel ASCAD with 60-70% mid LAD with positive FFR and 90% distal left circ s/p DES to the LAD and distal LCX. He is on DAPT with ASA/Plavix. He had a nuclear stress test in 08/2015 that showed no ischemia.   He is here today for followup and is doing well.  He denies anginal chest pain.  He did have some chest discomfort that he described as a tightness and SOB when outside walking that was in the upper chest on both sides and took allergy medication and it resolved.  He thinks it was allergy related because he can exercise indoor on the treadmill and bike without any problems.   He has chronic DOE when going up stairs but otherwise no SOB, PND or orthopnea.  He denies any LE edema, claudication, dizziness or syncope. Occasionally he will notice a skipped heart beat.    Past Medical History:  Diagnosis Date  . Allergic rhinitis   . Arthritis    "a little bit in some of my joints" (06/20/2014)  . Coronary artery disease    a. 05/2014 Cath/PCI: LM nl, LAD 40p, 60-70d (FFR 0.80->3.0x24 Synergy DES), D2 40, LCX 90d (2.25x12 Synergy DES), RCA 20-22m, EF 60-65%.  . Depression   . ED (erectile dysfunction)   . Environmental allergies    dry cough  . GERD (gastroesophageal reflux disease)   . History of hiatal hernia   . Hydronephrosis of right kidney    a. s/p ureteral stenting in the past.  . Hyperlipidemia    under control  . Hypertension   . Hypothyroidism   . OCD (obsessive compulsive disorder)   . Panic disorder   . PONV (postoperative nausea and vomiting)    after thyroid surgery no  problems in 10 years   . Prostate cancer [facility])    prostate cancer- robotic prostatectomy - followed by radiation because of positive lymph node--and pt on lupron injections  . Sinus bradycardia    usually in 50's  . SUI (stress urinary incontinence), male    S/P ROBOTIC PROSTATECTOMY AND RADIATION TX    Past Surgical History:  Procedure Laterality Date  . COLONOSCOPY  07/2013   Dr. Oletta Lamas  . CORONARY ANGIOPLASTY WITH STENT PLACEMENT  06/20/2014   "2"  . CYSTOSCOPY  11/30/2011   Procedure: CYSTOSCOPY;  Surgeon: Reece Packer, MD;  [facility];  Service: Urology;  Laterality: N/A;  Inplantation of Artificial Sphincter and Cystoscopy  . CYSTOSCOPY WITH RETROGRADE PYELOGRAM, URETEROSCOPY AND STENT PLACEMENT Right 02/11/2014   Procedure: CYSTOSCOPY WITH RETROGRADE PYELOGRAM, URETEROSCOPY ,URETERAL BIOPSY AND STENT PLACEMENT;  Surgeon: Raynelle Bring, MD;  [facility];  Service: Urology;  Laterality: Right;  . CYSTOSCOPY WITH RETROGRADE PYELOGRAM, URETEROSCOPY AND STENT PLACEMENT Right 04/15/2014   Procedure: CYSTOSCOPY WITH RETROGRADE PYELOGRAM, URETEROSCOPY, BIOPSY AND STENT PLACEMENT;  Surgeon: Raynelle Bring, MD;  [facility];  Service: Urology;  Laterality: Right;  . LEFT HEART CATHETERIZATION WITH CORONARY ANGIOGRAM N/A 06/20/2014   Procedure: LEFT HEART CATHETERIZATION WITH CORONARY ANGIOGRAM;  Surgeon: Leonie Man, MD;  Location: [facility] CATH LAB;  Service: Cardiovascular;  Laterality: N/A;  . PERCUTANEOUS CORONARY STENT INTERVENTION (PCI-S)  06/20/2014   Procedure: PERCUTANEOUS CORONARY STENT INTERVENTION (PCI-S);  Surgeon: Leonie Man, MD;  Location: [facility] CATH LAB;  Service: Cardiovascular;;  LAD and Circumflex  . REFRACTIVE SURGERY Bilateral 2004  . ROBOT ASSISTED LAPAROSCOPIC RADICAL PROSTATECTOMY  05/2010  . THYROIDECTOMY, PARTIAL  10/1974  . TONSILLECTOMY  1956   . TOTAL THYROIDECTOMY  10/2012   "nuked it"  . UPPER GI ENDOSCOPY     food impaction 2009 done by Dr  Oletta Lamas  . URINARY SPHINCTER IMPLANT  11/30/2011   Procedure: ARTIFICIAL URINARY SPHINCTER;  Surgeon: Reece Packer, MD;  [facility];  Service: Urology;  Laterality: N/A;    Current Medications: Current Meds  Medication Sig  . aspirin EC 81 MG tablet Take 81 mg by mouth daily.  Marland Kitchen atorvastatin (LIPITOR) 40 MG tablet Take 1 tablet (40 mg total) by mouth every morning.  Marland Kitchen Fexofenadine HCl (ALLEGRA PO) Take by mouth daily.  . fluvoxaMINE (LUVOX) 50 MG tablet Take 150 mg by mouth every morning.   Marland Kitchen gemfibrozil (LOPID) 600 MG tablet Take 600 mg by mouth 2 (two) times daily.   Marland Kitchen latanoprost (XALATAN) 0.005 % ophthalmic solution Place 1 drop into both eyes at bedtime.   Marland Kitchen levothyroxine (SYNTHROID, LEVOTHROID) 175 MCG tablet Take 175 mcg by mouth daily before breakfast.  . lisinopril (PRINIVIL,ZESTRIL) 10 MG tablet Take 1 tablet (10 mg total) by mouth every morning.  . nitroGLYCERIN (NITROSTAT) 0.4 MG SL tablet PLACE 1 TABLET (0.4 MG TOTAL) UNDER THE TONGUE EVERY 5 (FIVE) MINUTES AS NEEDED FOR CHEST PAIN.  Marland Kitchen pantoprazole (PROTONIX) 40 MG tablet Take 2 tablets (80 mg total) by mouth daily.  Marland Kitchen zolpidem (AMBIEN) 10 MG tablet Take 10 mg by mouth at bedtime as needed for sleep.     Allergies:   Niacin and related and Tagamet [cimetidine]   Social History   Social History  . Marital status: Married    Spouse name: N/A  . Number of children: N/A  . Years of education: N/A   Social History Main Topics  . Smoking status: Never Smoker  . Smokeless tobacco: Never Used  . Alcohol use 0.0 oz/week     Comment: 06/20/2014 "1-2 drinks a couple times/month"  . Drug use: No  . Sexual activity: Yes   Other Topics Concern  . None   Social History Narrative  . None     Family History:  The patient's family history includes CAD in his brother; Cancer in his father; Heart disease in his father; Hyperlipidemia in his father.   ROS:   Please see the history of present illness.    ROS All  other systems reviewed and are negative.  No flowsheet data found.     PHYSICAL EXAM:   VS:  BP 128/88   Pulse (!) 40   Ht 5\' 11"  (1.803 m)   Wt 265 lb 12.8 oz (120.6 kg)   BMI 37.07 kg/m    GEN: Well nourished, well developed, in no acute distress  HEENT: normal  Neck: no JVD, carotid bruits, or masses Cardiac: RRR; no murmurs, rubs, or gallops,no edema.  Intact distal pulses bilaterally.  Respiratory:  clear to auscultation bilaterally, normal work of breathing GI: soft, nontender, nondistended, + BS MS: no deformity or atrophy  Skin: warm and dry, no rash Neuro:  Alert and Oriented x 3, Strength and sensation are intact Psych: euthymic  mood, full affect  Wt Readings from Last 3 Encounters:  10/11/16 265 lb 12.8 oz (120.6 kg)  03/17/16 258 lb 1.9 oz (117.1 kg)  09/25/15 257 lb (116.6 kg)      Studies/Labs Reviewed:   EKG:  EKG is ordered today.  The ekg ordered today demonstrates sinus bradycardia with nonspecific ST abnormality  Recent Labs: 10/14/2015: ALT 15; BUN 19; Creat 1.07; Potassium 4.3; Sodium 138   Lipid Panel    Component Value Date/Time   CHOL 129 10/14/2015 0850   TRIG 201 (H) 10/14/2015 0850   HDL 31 (L) 10/14/2015 0850   CHOLHDL 4.2 10/14/2015 0850   VLDL 40 (H) 10/14/2015 0850   LDLCALC 58 10/14/2015 0850    Additional studies/ records that were reviewed today include:  none    ASSESSMENT:    1. CAD S/P percutaneous coronary angioplasty   2. Essential hypertension   3. Dilated aortic root ([place])   4. Sinus bradycardia   5. Dyslipidemia      PLAN:  In order of problems listed above:  1. ASCAD - s/p cath 05/2014 showing LM nl, LAD 40p, 60-70d (FFR 0.80->3.0x24 Synergy DES), D2 40, LCX 90d (2.25x12 Synergy DES), RCA 20-10m, EF 60-65%.  He has not had any further anginal symptoms. He will continue on ASA/statin 2. HTN - His BP is adequately controlled on current meds.  He will continue Lisinopril 10mg  daily.  3. Dilated aortic root by  echo - 61mm of echo 10/2015.  I will repeat a limited echo   4. Sinus bradycardia - he is asymptomatic and this has been chronic.  No further workup at this time.  He is not on any heart rate slowing drugs.   5. Dyslipidemia with LDL goal < 70.  He will continue on current statin therapy with  Atorvastatin 40mg  daily and gemfibrozil 600mg  BID.  I will get an FLP and ALT today.      Medication Adjustments/Labs and Tests Ordered: Current medicines are reviewed at length with the patient today.  Concerns regarding medicines are outlined above.  Medication changes, Labs and Tests ordered today are listed in the Patient Instructions below.  There are no Patient Instructions on file for this visit.   Signed, Fransico Him, MD  10/11/2016 8:23 AM    [facility] [address], [place], [place]  [address] Phone: [phone]; Fax: [phone]

## 2016-10-11 ENCOUNTER — Ambulatory Visit (INDEPENDENT_AMBULATORY_CARE_PROVIDER_SITE_OTHER): Payer: BC Managed Care – PPO | Admitting: Cardiology

## 2016-10-11 ENCOUNTER — Encounter: Payer: Self-pay | Admitting: Cardiology

## 2016-10-11 ENCOUNTER — Encounter (HOSPITAL_COMMUNITY): Payer: Self-pay

## 2016-10-11 VITALS — BP 128/88 | HR 40 | Ht 71.0 in | Wt 265.8 lb

## 2016-10-11 DIAGNOSIS — I10 Essential (primary) hypertension: Secondary | ICD-10-CM | POA: Diagnosis not present

## 2016-10-11 DIAGNOSIS — E785 Hyperlipidemia, unspecified: Secondary | ICD-10-CM | POA: Diagnosis not present

## 2016-10-11 DIAGNOSIS — R001 Bradycardia, unspecified: Secondary | ICD-10-CM

## 2016-10-11 DIAGNOSIS — I77810 Thoracic aortic ectasia: Secondary | ICD-10-CM | POA: Diagnosis not present

## 2016-10-11 DIAGNOSIS — I2510 Atherosclerotic heart disease of native coronary artery without angina pectoris: Secondary | ICD-10-CM

## 2016-10-11 DIAGNOSIS — Z9861 Coronary angioplasty status: Secondary | ICD-10-CM

## 2016-10-11 LAB — HEPATIC FUNCTION PANEL
ALT: 25 IU/L (ref 0–44)
AST: 17 IU/L (ref 0–40)
Albumin: 4.6 g/dL (ref 3.6–4.8)
Alkaline Phosphatase: 66 IU/L (ref 39–117)
Bilirubin Total: 0.2 mg/dL (ref 0.0–1.2)
Bilirubin, Direct: 0.08 mg/dL (ref 0.00–0.40)
Total Protein: 6.9 g/dL (ref 6.0–8.5)

## 2016-10-11 LAB — LIPID PANEL
Chol/HDL Ratio: 3.6 ratio (ref 0.0–5.0)
Cholesterol, Total: 140 mg/dL (ref 100–199)
HDL: 39 mg/dL — ABNORMAL LOW (ref >39)
LDL Calculated: 72 mg/dL (ref 0–99)
Triglycerides: 143 mg/dL (ref 0–149)
VLDL Cholesterol Cal: 29 mg/dL (ref 5–40)

## 2016-10-11 LAB — BASIC METABOLIC PANEL
BUN/Creatinine Ratio: 18 (ref 10–24)
BUN: 21 mg/dL (ref 8–27)
CO2: 24 mmol/L (ref 18–29)
Calcium: 9.5 mg/dL (ref 8.6–10.2)
Chloride: 100 mmol/L (ref 96–106)
Creatinine, Ser: 1.14 mg/dL (ref 0.76–1.27)
GFR calc Af Amer: 78 mL/min/{1.73_m2} (ref >59)
GFR calc non Af Amer: 68 mL/min/{1.73_m2} (ref >59)
Glucose: 99 mg/dL (ref 65–99)
Potassium: 4.6 mmol/L (ref 3.5–5.2)
Sodium: 140 mmol/L (ref 134–144)

## 2016-10-11 NOTE — Patient Instructions (Signed)
Medication Instructions:  Your physician recommends that you continue on your current medications as directed. Please refer to the Current Medication list given to you today.   Labwork: TODAY: BMET, LFTs, Lipids  Testing/Procedures: None  Follow-Up: Your physician wants you to follow-up in: 6 months with Dr. Radford Pax. You will receive a reminder letter in the mail two months in advance. If you don't receive a letter, please call our office to schedule the follow-up appointment.   Any Other Special Instructions Will Be Listed Below (If Applicable).     If you need a refill on your cardiac medications before your next appointment, please call your pharmacy.

## 2016-10-13 ENCOUNTER — Encounter (HOSPITAL_COMMUNITY)
Admission: RE | Admit: 2016-10-13 | Discharge: 2016-10-13 | Disposition: A | Discharge status: Home / Self Care | Payer: Self-pay | Source: Ambulatory Visit | Attending: Cardiology | Admitting: Cardiology

## 2016-10-15 ENCOUNTER — Encounter (HOSPITAL_COMMUNITY): Payer: Self-pay

## 2016-10-15 ENCOUNTER — Encounter (HOSPITAL_COMMUNITY)
Admission: RE | Admit: 2016-10-15 | Discharge: 2016-10-15 | Disposition: A | Discharge status: Home / Self Care | Payer: Self-pay | Source: Ambulatory Visit | Attending: Cardiology | Admitting: Cardiology

## 2016-10-18 ENCOUNTER — Encounter (HOSPITAL_COMMUNITY): Payer: Self-pay

## 2016-10-19 ENCOUNTER — Encounter (HOSPITAL_COMMUNITY)
Admission: RE | Admit: 2016-10-19 | Discharge: 2016-10-19 | Disposition: A | Discharge status: Home / Self Care | Payer: Self-pay | Source: Ambulatory Visit | Attending: Cardiology | Admitting: Cardiology

## 2016-10-20 ENCOUNTER — Encounter (HOSPITAL_COMMUNITY): Payer: Self-pay

## 2016-10-21 ENCOUNTER — Encounter (HOSPITAL_COMMUNITY)
Admission: RE | Admit: 2016-10-21 | Discharge: 2016-10-21 | Disposition: A | Discharge status: Home / Self Care | Payer: Self-pay | Source: Ambulatory Visit | Attending: Cardiology | Admitting: Cardiology

## 2016-10-22 ENCOUNTER — Encounter (HOSPITAL_COMMUNITY): Payer: Self-pay

## 2016-10-26 ENCOUNTER — Encounter (HOSPITAL_COMMUNITY)
Admission: RE | Admit: 2016-10-26 | Discharge: 2016-10-26 | Disposition: A | Discharge status: Home / Self Care | Payer: Self-pay | Source: Ambulatory Visit | Attending: Cardiology | Admitting: Cardiology

## 2016-10-27 ENCOUNTER — Encounter (HOSPITAL_COMMUNITY): Payer: Self-pay

## 2016-10-28 ENCOUNTER — Encounter (HOSPITAL_COMMUNITY)
Admission: RE | Admit: 2016-10-28 | Discharge: 2016-10-28 | Disposition: A | Discharge status: Home / Self Care | Payer: Self-pay | Source: Ambulatory Visit | Attending: Cardiology | Admitting: Cardiology

## 2016-10-29 ENCOUNTER — Encounter (HOSPITAL_COMMUNITY): Payer: BC Managed Care – PPO

## 2016-10-29 ENCOUNTER — Encounter (HOSPITAL_COMMUNITY)
Admission: RE | Admit: 2016-10-29 | Discharge: 2016-10-29 | Disposition: A | Discharge status: Home / Self Care | Payer: BC Managed Care – PPO | Source: Ambulatory Visit | Attending: Cardiology | Admitting: Cardiology

## 2016-10-29 DIAGNOSIS — I2510 Atherosclerotic heart disease of native coronary artery without angina pectoris: Secondary | ICD-10-CM | POA: Diagnosis not present

## 2016-11-01 ENCOUNTER — Encounter (HOSPITAL_COMMUNITY): Payer: BC Managed Care – PPO

## 2016-11-02 ENCOUNTER — Encounter (HOSPITAL_COMMUNITY): Payer: BC Managed Care – PPO

## 2016-11-03 ENCOUNTER — Encounter (HOSPITAL_COMMUNITY): Payer: BC Managed Care – PPO

## 2016-11-04 ENCOUNTER — Encounter (HOSPITAL_COMMUNITY)
Admission: RE | Admit: 2016-11-04 | Discharge: 2016-11-04 | Disposition: A | Discharge status: Home / Self Care | Payer: BC Managed Care – PPO | Source: Ambulatory Visit | Attending: Cardiology | Admitting: Cardiology

## 2016-11-05 ENCOUNTER — Encounter (HOSPITAL_COMMUNITY): Payer: BC Managed Care – PPO

## 2016-11-05 ENCOUNTER — Encounter (HOSPITAL_COMMUNITY)
Admission: RE | Admit: 2016-11-05 | Discharge: 2016-11-05 | Disposition: A | Discharge status: Home / Self Care | Payer: BC Managed Care – PPO | Source: Ambulatory Visit | Attending: Cardiology | Admitting: Cardiology

## 2016-11-08 ENCOUNTER — Encounter (HOSPITAL_COMMUNITY): Payer: BC Managed Care – PPO

## 2016-11-09 ENCOUNTER — Encounter (HOSPITAL_COMMUNITY)
Admission: RE | Admit: 2016-11-09 | Discharge: 2016-11-09 | Disposition: A | Discharge status: Home / Self Care | Payer: BC Managed Care – PPO | Source: Ambulatory Visit | Attending: Cardiology | Admitting: Cardiology

## 2016-11-10 ENCOUNTER — Encounter (HOSPITAL_COMMUNITY): Payer: BC Managed Care – PPO

## 2016-11-11 ENCOUNTER — Encounter (HOSPITAL_COMMUNITY): Payer: BC Managed Care – PPO

## 2016-11-12 ENCOUNTER — Other Ambulatory Visit: Payer: BC Managed Care – PPO

## 2016-11-12 ENCOUNTER — Encounter (HOSPITAL_COMMUNITY): Payer: BC Managed Care – PPO

## 2016-11-12 ENCOUNTER — Telehealth: Payer: Self-pay | Admitting: Physician Assistant

## 2016-11-12 ENCOUNTER — Other Ambulatory Visit: Payer: Self-pay | Admitting: Physician Assistant

## 2016-11-12 ENCOUNTER — Encounter (HOSPITAL_COMMUNITY)
Admission: RE | Admit: 2016-11-12 | Discharge: 2016-11-12 | Disposition: A | Discharge status: Home / Self Care | Payer: BC Managed Care – PPO | Source: Ambulatory Visit | Attending: Cardiology | Admitting: Cardiology

## 2016-11-12 DIAGNOSIS — I2510 Atherosclerotic heart disease of native coronary artery without angina pectoris: Secondary | ICD-10-CM | POA: Diagnosis not present

## 2016-11-12 DIAGNOSIS — R001 Bradycardia, unspecified: Secondary | ICD-10-CM

## 2016-11-12 LAB — BASIC METABOLIC PANEL
BUN/Creatinine Ratio: 16 (ref 10–24)
BUN: 19 mg/dL (ref 8–27)
CO2: 21 mmol/L (ref 20–29)
Calcium: 9.3 mg/dL (ref 8.6–10.2)
Chloride: 101 mmol/L (ref 96–106)
Creatinine, Ser: 1.18 mg/dL (ref 0.76–1.27)
GFR calc Af Amer: 75 mL/min/{1.73_m2} (ref >59)
GFR calc non Af Amer: 65 mL/min/{1.73_m2} (ref >59)
Glucose: 97 mg/dL (ref 65–99)
Potassium: 4.6 mmol/L (ref 3.5–5.2)
Sodium: 140 mmol/L (ref 134–144)

## 2016-11-12 LAB — TSH: TSH: 3.01 u[IU]/mL (ref 0.450–4.500)

## 2016-11-12 NOTE — Progress Notes (Signed)
Patient in today for his cardiac rehab maintenance program exercise. Patients HR noted to be lower on pulse ox. Placed on zoll monitor. Sinus brady with PACs rate in 30s noted. Patient denies any symptoms. BP 125/82. Spoke with Nell Range, RN with cardiology. 12 lead obtained. Reviewed by Joellen Jersey. Patient scheduled for labs ASAP at [facility] Cardiology office. Patient discharged denying any symptoms of bradycardia. Instructed to seek emergent care for dizziness or passing out. Also educated on vagal response of BM, coughing, and sneezing. Patient verbalized understanding.

## 2016-11-12 NOTE — Telephone Encounter (Signed)
    Called by cardiac rehab due to [name] heart rate being in the 30-40s. He is on no AV nodal blocking agents. I requested an ECG which showed sinus brady with HR 38.  Review of old ECGs from 10/01/16 showed HR of 40 bpm. Dr Radford Pax has been aware of this and recommended no further work up at this time. He is currently asymptomatic but has notice a 10 KG weight gain recently. He has a hx of thyroid ablation and takes synthroid. I have asked him to go to our office today for a BMET  (to check electrolytes) and a TSH. He will go to the ER if he becomes symptomatic or has dizziness or syncope.   Angelena Form PA-C  [facility]    .

## 2016-11-15 ENCOUNTER — Ambulatory Visit (HOSPITAL_COMMUNITY): Payer: BC Managed Care – PPO | Attending: Cardiology

## 2016-11-15 ENCOUNTER — Encounter: Payer: Self-pay | Admitting: Cardiology

## 2016-11-15 ENCOUNTER — Encounter (HOSPITAL_COMMUNITY): Payer: BC Managed Care – PPO

## 2016-11-15 ENCOUNTER — Other Ambulatory Visit: Payer: Self-pay

## 2016-11-15 ENCOUNTER — Telehealth: Payer: Self-pay

## 2016-11-15 DIAGNOSIS — R001 Bradycardia, unspecified: Secondary | ICD-10-CM | POA: Insufficient documentation

## 2016-11-15 DIAGNOSIS — E785 Hyperlipidemia, unspecified: Secondary | ICD-10-CM | POA: Diagnosis not present

## 2016-11-15 DIAGNOSIS — I2510 Atherosclerotic heart disease of native coronary artery without angina pectoris: Secondary | ICD-10-CM | POA: Diagnosis not present

## 2016-11-15 DIAGNOSIS — I119 Hypertensive heart disease without heart failure: Secondary | ICD-10-CM | POA: Insufficient documentation

## 2016-11-15 DIAGNOSIS — I080 Rheumatic disorders of both mitral and aortic valves: Secondary | ICD-10-CM | POA: Diagnosis not present

## 2016-11-15 DIAGNOSIS — E039 Hypothyroidism, unspecified: Secondary | ICD-10-CM | POA: Diagnosis not present

## 2016-11-15 DIAGNOSIS — I77810 Thoracic aortic ectasia: Secondary | ICD-10-CM | POA: Insufficient documentation

## 2016-11-15 NOTE — Telephone Encounter (Signed)
-----   Message from Sueanne Margarita, MD sent at 11/15/2016  3:30 PM EDT ----- Echo showed normal LVF with moderate LVH, increased stiffness of heart muscle, mildly dilated aortic root and ascending aorta - repeat echo in 1 year for dilated aorta

## 2016-11-15 NOTE — Telephone Encounter (Signed)
Informed patient of results and verbal understanding expressed.  Repeat ECHO ordered to be scheduled in 1 year. Patient agrees with treatment plan. 

## 2016-11-16 ENCOUNTER — Telehealth: Payer: Self-pay

## 2016-11-16 ENCOUNTER — Encounter (HOSPITAL_COMMUNITY): Payer: BC Managed Care – PPO

## 2016-11-16 DIAGNOSIS — R001 Bradycardia, unspecified: Secondary | ICD-10-CM

## 2016-11-16 NOTE — Telephone Encounter (Signed)
Per Dr. Radford Pax, ordered 24 hour holter monitor for bradycardia. Patient agrees with treatment plan.

## 2016-11-17 ENCOUNTER — Encounter (HOSPITAL_COMMUNITY)
Admission: RE | Admit: 2016-11-17 | Discharge: 2016-11-17 | Disposition: A | Discharge status: Home / Self Care | Payer: BC Managed Care – PPO | Source: Ambulatory Visit | Attending: Cardiology | Admitting: Cardiology

## 2016-11-17 ENCOUNTER — Encounter (HOSPITAL_COMMUNITY): Payer: BC Managed Care – PPO

## 2016-11-17 NOTE — Telephone Encounter (Signed)
Monitor appointment has been scheduled 6/21.

## 2016-11-18 ENCOUNTER — Encounter (HOSPITAL_COMMUNITY): Payer: BC Managed Care – PPO

## 2016-11-18 ENCOUNTER — Ambulatory Visit (INDEPENDENT_AMBULATORY_CARE_PROVIDER_SITE_OTHER): Payer: BC Managed Care – PPO

## 2016-11-18 DIAGNOSIS — R001 Bradycardia, unspecified: Secondary | ICD-10-CM

## 2016-11-19 ENCOUNTER — Encounter (HOSPITAL_COMMUNITY)
Admission: RE | Admit: 2016-11-19 | Discharge: 2016-11-19 | Disposition: A | Discharge status: Home / Self Care | Payer: BC Managed Care – PPO | Source: Ambulatory Visit | Attending: Cardiology | Admitting: Cardiology

## 2016-11-19 ENCOUNTER — Encounter (HOSPITAL_COMMUNITY): Payer: BC Managed Care – PPO

## 2016-11-22 ENCOUNTER — Encounter (HOSPITAL_COMMUNITY): Payer: BC Managed Care – PPO

## 2016-11-23 ENCOUNTER — Encounter (HOSPITAL_COMMUNITY): Payer: BC Managed Care – PPO

## 2016-11-24 ENCOUNTER — Encounter (HOSPITAL_COMMUNITY): Payer: BC Managed Care – PPO

## 2016-11-25 ENCOUNTER — Encounter (HOSPITAL_COMMUNITY)
Admission: RE | Admit: 2016-11-25 | Discharge: 2016-11-25 | Disposition: A | Discharge status: Home / Self Care | Payer: BC Managed Care – PPO | Source: Ambulatory Visit | Attending: Cardiology | Admitting: Cardiology

## 2016-11-26 ENCOUNTER — Encounter (HOSPITAL_COMMUNITY): Payer: BC Managed Care – PPO

## 2016-11-30 ENCOUNTER — Encounter (HOSPITAL_COMMUNITY): Payer: Self-pay

## 2016-11-30 ENCOUNTER — Other Ambulatory Visit: Payer: Self-pay | Admitting: Cardiology

## 2016-11-30 DIAGNOSIS — I2510 Atherosclerotic heart disease of native coronary artery without angina pectoris: Secondary | ICD-10-CM | POA: Insufficient documentation

## 2016-12-02 ENCOUNTER — Encounter (HOSPITAL_COMMUNITY): Payer: Self-pay

## 2016-12-03 ENCOUNTER — Encounter (HOSPITAL_COMMUNITY): Payer: Self-pay

## 2016-12-07 ENCOUNTER — Encounter (HOSPITAL_COMMUNITY)
Admission: RE | Admit: 2016-12-07 | Discharge: 2016-12-07 | Disposition: A | Discharge status: Home / Self Care | Payer: Self-pay | Source: Ambulatory Visit | Attending: Cardiology | Admitting: Cardiology

## 2016-12-09 ENCOUNTER — Encounter (HOSPITAL_COMMUNITY)
Admission: RE | Admit: 2016-12-09 | Discharge: 2016-12-09 | Disposition: A | Discharge status: Home / Self Care | Payer: Self-pay | Source: Ambulatory Visit | Attending: Cardiology | Admitting: Cardiology

## 2016-12-10 ENCOUNTER — Encounter (HOSPITAL_COMMUNITY): Payer: Self-pay

## 2016-12-14 ENCOUNTER — Encounter (HOSPITAL_COMMUNITY)
Admission: RE | Admit: 2016-12-14 | Discharge: 2016-12-14 | Disposition: A | Discharge status: Home / Self Care | Payer: Self-pay | Source: Ambulatory Visit | Attending: Cardiology | Admitting: Cardiology

## 2016-12-16 ENCOUNTER — Encounter (HOSPITAL_COMMUNITY)
Admission: RE | Admit: 2016-12-16 | Discharge: 2016-12-16 | Disposition: A | Discharge status: Home / Self Care | Payer: Self-pay | Source: Ambulatory Visit | Attending: Cardiology | Admitting: Cardiology

## 2016-12-17 ENCOUNTER — Encounter (HOSPITAL_COMMUNITY): Payer: Self-pay

## 2016-12-18 ENCOUNTER — Encounter: Payer: Self-pay | Admitting: Cardiology

## 2016-12-20 ENCOUNTER — Other Ambulatory Visit: Payer: Self-pay | Admitting: Cardiology

## 2016-12-20 MED ORDER — PANTOPRAZOLE SODIUM 40 MG PO TBEC: 80.0000 mg | DELAYED_RELEASE_TABLET | Freq: Every day | ORAL | 2 refills | Status: DC

## 2016-12-20 NOTE — Telephone Encounter (Signed)
Pt's medication was sent to pt's pharmacy as requested. Confirmation received.  °

## 2016-12-21 ENCOUNTER — Encounter (HOSPITAL_COMMUNITY)
Admission: RE | Admit: 2016-12-21 | Discharge: 2016-12-21 | Disposition: A | Discharge status: Home / Self Care | Payer: Self-pay | Source: Ambulatory Visit | Attending: Cardiology | Admitting: Cardiology

## 2016-12-23 ENCOUNTER — Encounter (HOSPITAL_COMMUNITY): Payer: Self-pay

## 2016-12-24 ENCOUNTER — Encounter (HOSPITAL_COMMUNITY): Payer: Self-pay

## 2016-12-28 ENCOUNTER — Encounter (HOSPITAL_COMMUNITY): Payer: Self-pay

## 2016-12-29 ENCOUNTER — Encounter (HOSPITAL_COMMUNITY)
Admission: RE | Admit: 2016-12-29 | Discharge: 2016-12-29 | Disposition: A | Discharge status: Home / Self Care | Payer: BC Managed Care – PPO | Source: Ambulatory Visit | Attending: Cardiology | Admitting: Cardiology

## 2016-12-29 DIAGNOSIS — I2510 Atherosclerotic heart disease of native coronary artery without angina pectoris: Secondary | ICD-10-CM | POA: Insufficient documentation

## 2016-12-30 ENCOUNTER — Encounter (HOSPITAL_COMMUNITY): Payer: BC Managed Care – PPO

## 2016-12-30 DIAGNOSIS — I2510 Atherosclerotic heart disease of native coronary artery without angina pectoris: Secondary | ICD-10-CM | POA: Diagnosis present

## 2016-12-31 ENCOUNTER — Encounter (HOSPITAL_COMMUNITY): Payer: BC Managed Care – PPO

## 2017-01-04 ENCOUNTER — Encounter (HOSPITAL_COMMUNITY)
Admission: RE | Admit: 2017-01-04 | Discharge: 2017-01-04 | Disposition: A | Discharge status: Home / Self Care | Payer: BC Managed Care – PPO | Source: Ambulatory Visit | Attending: Cardiology | Admitting: Cardiology

## 2017-01-06 ENCOUNTER — Encounter (HOSPITAL_COMMUNITY): Payer: BC Managed Care – PPO

## 2017-01-07 ENCOUNTER — Encounter (HOSPITAL_COMMUNITY): Payer: BC Managed Care – PPO

## 2017-01-11 ENCOUNTER — Encounter (HOSPITAL_COMMUNITY): Payer: BC Managed Care – PPO

## 2017-01-12 ENCOUNTER — Encounter (HOSPITAL_COMMUNITY)
Admission: RE | Admit: 2017-01-12 | Discharge: 2017-01-12 | Disposition: A | Discharge status: Home / Self Care | Payer: BC Managed Care – PPO | Source: Ambulatory Visit | Attending: Cardiology | Admitting: Cardiology

## 2017-01-13 ENCOUNTER — Encounter (HOSPITAL_COMMUNITY): Payer: BC Managed Care – PPO

## 2017-01-14 ENCOUNTER — Encounter (HOSPITAL_COMMUNITY): Payer: BC Managed Care – PPO

## 2017-01-17 ENCOUNTER — Encounter (HOSPITAL_COMMUNITY)
Admission: RE | Admit: 2017-01-17 | Discharge: 2017-01-17 | Disposition: A | Discharge status: Home / Self Care | Payer: BC Managed Care – PPO | Source: Ambulatory Visit | Attending: Cardiology | Admitting: Cardiology

## 2017-01-18 ENCOUNTER — Encounter (HOSPITAL_COMMUNITY): Payer: BC Managed Care – PPO

## 2017-01-20 ENCOUNTER — Encounter (HOSPITAL_COMMUNITY): Payer: BC Managed Care – PPO

## 2017-01-21 ENCOUNTER — Encounter (HOSPITAL_COMMUNITY): Payer: BC Managed Care – PPO

## 2017-01-25 ENCOUNTER — Encounter (HOSPITAL_COMMUNITY): Payer: BC Managed Care – PPO

## 2017-01-27 ENCOUNTER — Encounter (HOSPITAL_COMMUNITY): Payer: BC Managed Care – PPO

## 2017-01-28 ENCOUNTER — Encounter (HOSPITAL_COMMUNITY): Payer: BC Managed Care – PPO

## 2017-02-01 ENCOUNTER — Encounter (HOSPITAL_COMMUNITY): Payer: Self-pay

## 2017-02-01 DIAGNOSIS — I2510 Atherosclerotic heart disease of native coronary artery without angina pectoris: Secondary | ICD-10-CM | POA: Insufficient documentation

## 2017-02-02 ENCOUNTER — Encounter (HOSPITAL_COMMUNITY)
Admission: RE | Admit: 2017-02-02 | Discharge: 2017-02-02 | Disposition: A | Discharge status: Home / Self Care | Payer: Self-pay | Source: Ambulatory Visit | Attending: Cardiology | Admitting: Cardiology

## 2017-02-03 ENCOUNTER — Encounter (HOSPITAL_COMMUNITY): Payer: Self-pay

## 2017-02-04 ENCOUNTER — Encounter (HOSPITAL_COMMUNITY): Payer: Self-pay

## 2017-02-04 ENCOUNTER — Encounter: Payer: Self-pay | Admitting: Cardiology

## 2017-02-08 ENCOUNTER — Encounter (HOSPITAL_COMMUNITY): Payer: Self-pay

## 2017-02-09 ENCOUNTER — Encounter (HOSPITAL_COMMUNITY)
Admission: RE | Admit: 2017-02-09 | Discharge: 2017-02-09 | Disposition: A | Discharge status: Home / Self Care | Payer: Self-pay | Source: Ambulatory Visit | Attending: Cardiology | Admitting: Cardiology

## 2017-02-10 ENCOUNTER — Encounter (HOSPITAL_COMMUNITY): Payer: Self-pay

## 2017-02-11 ENCOUNTER — Encounter (HOSPITAL_COMMUNITY): Payer: Self-pay

## 2017-02-14 ENCOUNTER — Encounter (HOSPITAL_COMMUNITY)
Admission: RE | Admit: 2017-02-14 | Discharge: 2017-02-14 | Disposition: A | Discharge status: Home / Self Care | Payer: Self-pay | Source: Ambulatory Visit | Attending: Cardiology | Admitting: Cardiology

## 2017-02-15 ENCOUNTER — Encounter (HOSPITAL_COMMUNITY): Payer: Self-pay

## 2017-02-17 ENCOUNTER — Encounter (HOSPITAL_COMMUNITY): Payer: Self-pay

## 2017-02-18 ENCOUNTER — Encounter (HOSPITAL_COMMUNITY)
Admission: RE | Admit: 2017-02-18 | Discharge: 2017-02-18 | Disposition: A | Discharge status: Home / Self Care | Payer: Self-pay | Source: Ambulatory Visit | Attending: Cardiology | Admitting: Cardiology

## 2017-02-22 ENCOUNTER — Encounter (HOSPITAL_COMMUNITY): Payer: Self-pay

## 2017-02-24 ENCOUNTER — Encounter (HOSPITAL_COMMUNITY): Payer: Self-pay

## 2017-02-25 ENCOUNTER — Encounter (HOSPITAL_COMMUNITY): Payer: Self-pay

## 2017-02-28 ENCOUNTER — Other Ambulatory Visit (HOSPITAL_COMMUNITY): Payer: Self-pay | Admitting: Urology

## 2017-02-28 DIAGNOSIS — C61 Malignant neoplasm of prostate: Secondary | ICD-10-CM

## 2017-03-01 ENCOUNTER — Encounter (HOSPITAL_COMMUNITY): Payer: Self-pay

## 2017-03-01 DIAGNOSIS — I2510 Atherosclerotic heart disease of native coronary artery without angina pectoris: Secondary | ICD-10-CM | POA: Insufficient documentation

## 2017-03-02 ENCOUNTER — Encounter (HOSPITAL_COMMUNITY)
Admission: RE | Admit: 2017-03-02 | Discharge: 2017-03-02 | Disposition: A | Discharge status: Home / Self Care | Payer: Self-pay | Source: Ambulatory Visit | Attending: Cardiology | Admitting: Cardiology

## 2017-03-03 ENCOUNTER — Encounter (HOSPITAL_COMMUNITY): Payer: Self-pay

## 2017-03-04 ENCOUNTER — Encounter (HOSPITAL_COMMUNITY): Payer: Self-pay

## 2017-03-08 ENCOUNTER — Encounter (HOSPITAL_COMMUNITY): Payer: Self-pay

## 2017-03-09 ENCOUNTER — Encounter (HOSPITAL_COMMUNITY)
Admission: RE | Admit: 2017-03-09 | Discharge: 2017-03-09 | Disposition: A | Discharge status: Home / Self Care | Payer: Self-pay | Source: Ambulatory Visit | Attending: Cardiology | Admitting: Cardiology

## 2017-03-10 ENCOUNTER — Encounter (HOSPITAL_COMMUNITY): Payer: Self-pay

## 2017-03-11 ENCOUNTER — Encounter (HOSPITAL_COMMUNITY): Payer: Self-pay

## 2017-03-15 ENCOUNTER — Encounter (HOSPITAL_COMMUNITY): Payer: Self-pay

## 2017-03-16 ENCOUNTER — Encounter (HOSPITAL_COMMUNITY)
Admission: RE | Admit: 2017-03-16 | Discharge: 2017-03-16 | Disposition: A | Discharge status: Home / Self Care | Payer: Self-pay | Source: Ambulatory Visit | Attending: Cardiology | Admitting: Cardiology

## 2017-03-17 ENCOUNTER — Encounter (HOSPITAL_COMMUNITY): Payer: Self-pay

## 2017-03-18 ENCOUNTER — Encounter (HOSPITAL_COMMUNITY)
Admission: RE | Admit: 2017-03-18 | Discharge: 2017-03-18 | Disposition: A | Discharge status: Home / Self Care | Payer: Self-pay | Source: Ambulatory Visit | Attending: Cardiology | Admitting: Cardiology

## 2017-03-21 ENCOUNTER — Encounter (HOSPITAL_COMMUNITY)
Admission: RE | Admit: 2017-03-21 | Discharge: 2017-03-21 | Disposition: A | Discharge status: Home / Self Care | Payer: Self-pay | Source: Ambulatory Visit | Attending: Cardiology | Admitting: Cardiology

## 2017-03-22 ENCOUNTER — Encounter (HOSPITAL_COMMUNITY): Payer: Self-pay

## 2017-03-23 ENCOUNTER — Encounter (HOSPITAL_COMMUNITY)
Admission: RE | Admit: 2017-03-23 | Discharge: 2017-03-23 | Disposition: A | Discharge status: Home / Self Care | Payer: Self-pay | Source: Ambulatory Visit | Attending: Cardiology | Admitting: Cardiology

## 2017-03-24 ENCOUNTER — Encounter (HOSPITAL_COMMUNITY): Payer: Self-pay

## 2017-03-25 ENCOUNTER — Encounter (HOSPITAL_COMMUNITY): Payer: Self-pay

## 2017-03-28 ENCOUNTER — Observation Stay (HOSPITAL_COMMUNITY)
Admission: AD | Admit: 2017-03-28 | Discharge: 2017-03-29 | Disposition: A | Discharge status: Home / Self Care | Payer: BC Managed Care – PPO | Source: Ambulatory Visit | Attending: Cardiology | Admitting: Cardiology

## 2017-03-28 ENCOUNTER — Telehealth: Payer: Self-pay | Admitting: Cardiology

## 2017-03-28 ENCOUNTER — Encounter: Payer: Self-pay | Admitting: Cardiology

## 2017-03-28 ENCOUNTER — Other Ambulatory Visit: Payer: Self-pay

## 2017-03-28 ENCOUNTER — Ambulatory Visit (INDEPENDENT_AMBULATORY_CARE_PROVIDER_SITE_OTHER): Payer: BC Managed Care – PPO | Admitting: Cardiology

## 2017-03-28 VITALS — BP 132/88 | HR 52 | Ht 71.0 in | Wt 273.0 lb

## 2017-03-28 DIAGNOSIS — I5031 Acute diastolic (congestive) heart failure: Secondary | ICD-10-CM | POA: Diagnosis not present

## 2017-03-28 DIAGNOSIS — Z955 Presence of coronary angioplasty implant and graft: Secondary | ICD-10-CM | POA: Insufficient documentation

## 2017-03-28 DIAGNOSIS — I110 Hypertensive heart disease with heart failure: Secondary | ICD-10-CM | POA: Diagnosis not present

## 2017-03-28 DIAGNOSIS — E785 Hyperlipidemia, unspecified: Secondary | ICD-10-CM | POA: Diagnosis not present

## 2017-03-28 DIAGNOSIS — R079 Chest pain, unspecified: Secondary | ICD-10-CM | POA: Diagnosis not present

## 2017-03-28 DIAGNOSIS — Z8546 Personal history of malignant neoplasm of prostate: Secondary | ICD-10-CM | POA: Insufficient documentation

## 2017-03-28 DIAGNOSIS — I200 Unstable angina: Secondary | ICD-10-CM

## 2017-03-28 DIAGNOSIS — I2510 Atherosclerotic heart disease of native coronary artery without angina pectoris: Secondary | ICD-10-CM | POA: Diagnosis not present

## 2017-03-28 DIAGNOSIS — Z79899 Other long term (current) drug therapy: Secondary | ICD-10-CM | POA: Insufficient documentation

## 2017-03-28 DIAGNOSIS — Z79890 Hormone replacement therapy (postmenopausal): Secondary | ICD-10-CM | POA: Insufficient documentation

## 2017-03-28 DIAGNOSIS — R001 Bradycardia, unspecified: Secondary | ICD-10-CM | POA: Diagnosis not present

## 2017-03-28 DIAGNOSIS — I77810 Thoracic aortic ectasia: Secondary | ICD-10-CM | POA: Insufficient documentation

## 2017-03-28 DIAGNOSIS — R0789 Other chest pain: Secondary | ICD-10-CM | POA: Diagnosis not present

## 2017-03-28 DIAGNOSIS — I25110 Atherosclerotic heart disease of native coronary artery with unstable angina pectoris: Principal | ICD-10-CM | POA: Insufficient documentation

## 2017-03-28 DIAGNOSIS — Z9861 Coronary angioplasty status: Secondary | ICD-10-CM

## 2017-03-28 DIAGNOSIS — I10 Essential (primary) hypertension: Secondary | ICD-10-CM | POA: Diagnosis present

## 2017-03-28 DIAGNOSIS — R0602 Shortness of breath: Secondary | ICD-10-CM

## 2017-03-28 DIAGNOSIS — Z7982 Long term (current) use of aspirin: Secondary | ICD-10-CM | POA: Diagnosis not present

## 2017-03-28 DIAGNOSIS — I2583 Coronary atherosclerosis due to lipid rich plaque: Secondary | ICD-10-CM | POA: Diagnosis not present

## 2017-03-28 DIAGNOSIS — E7800 Pure hypercholesterolemia, unspecified: Secondary | ICD-10-CM

## 2017-03-28 LAB — COMPREHENSIVE METABOLIC PANEL
ALT: 29 U/L (ref 17–63)
AST: 27 U/L (ref 15–41)
Albumin: 4 g/dL (ref 3.5–5.0)
Alkaline Phosphatase: 61 U/L (ref 38–126)
Anion gap: 11 (ref 5–15)
BUN: 14 mg/dL (ref 6–20)
CO2: 22 mmol/L (ref 22–32)
Calcium: 8.7 mg/dL — ABNORMAL LOW (ref 8.9–10.3)
Chloride: 105 mmol/L (ref 101–111)
Creatinine, Ser: 1.06 mg/dL (ref 0.61–1.24)
GFR calc Af Amer: >60 mL/min (ref >60)
GFR calc non Af Amer: >60 mL/min (ref >60)
Glucose, Bld: 109 mg/dL — ABNORMAL HIGH (ref 65–99)
Potassium: 3.6 mmol/L (ref 3.5–5.1)
Sodium: 138 mmol/L (ref 135–145)
Total Bilirubin: 0.7 mg/dL (ref 0.3–1.2)
Total Protein: 6.8 g/dL (ref 6.5–8.1)

## 2017-03-28 LAB — CBC WITH DIFFERENTIAL/PLATELET
Basophils Absolute: 0 10*3/uL (ref 0.0–0.1)
Basophils Relative: 0 %
Eosinophils Absolute: 0.2 10*3/uL (ref 0.0–0.7)
Eosinophils Relative: 3 %
HCT: 39.8 % (ref 39.0–52.0)
Hemoglobin: 13.2 g/dL (ref 13.0–17.0)
Lymphocytes Relative: 27 %
Lymphs Abs: 1.8 10*3/uL (ref 0.7–4.0)
MCH: 29.4 pg (ref 26.0–34.0)
MCHC: 33.2 g/dL (ref 30.0–36.0)
MCV: 88.6 fL (ref 78.0–100.0)
Monocytes Absolute: 0.5 10*3/uL (ref 0.1–1.0)
Monocytes Relative: 7 %
Neutro Abs: 4 10*3/uL (ref 1.7–7.7)
Neutrophils Relative %: 63 %
Platelets: 217 10*3/uL (ref 150–400)
RBC: 4.49 MIL/uL (ref 4.22–5.81)
RDW: 13.8 % (ref 11.5–15.5)
WBC: 6.5 10*3/uL (ref 4.0–10.5)

## 2017-03-28 LAB — HEMOGLOBIN A1C
Hgb A1c MFr Bld: 5.8 % — ABNORMAL HIGH (ref 4.8–5.6)
Mean Plasma Glucose: 119.76 mg/dL

## 2017-03-28 LAB — MAGNESIUM: Magnesium: 1.7 mg/dL (ref 1.7–2.4)

## 2017-03-28 LAB — PROTIME-INR
INR: 1.05
Prothrombin Time: 13.6 seconds (ref 11.4–15.2)

## 2017-03-28 LAB — TSH: TSH: 1.042 u[IU]/mL (ref 0.350–4.500)

## 2017-03-28 LAB — T4, FREE: Free T4: 1 ng/dL (ref 0.61–1.12)

## 2017-03-28 LAB — TROPONIN I: Troponin I: <0.03 ng/mL (ref <0.03)

## 2017-03-28 MED ORDER — ASPIRIN 81 MG PO CHEW: 324.0000 mg | CHEWABLE_TABLET | ORAL | Status: AC

## 2017-03-28 MED ORDER — HEPARIN BOLUS VIA INFUSION
4000.0000 [IU] | Freq: Once | INTRAVENOUS | Status: AC
  Administered 2017-03-28: 4000 [IU] via INTRAVENOUS
  Filled 2017-03-28: qty 4000

## 2017-03-28 MED ORDER — ATORVASTATIN CALCIUM 40 MG PO TABS
40.0000 mg | ORAL_TABLET | Freq: Every day | ORAL | Status: DC
  Filled 2017-03-28: qty 1

## 2017-03-28 MED ORDER — ONDANSETRON HCL 4 MG/2ML IJ SOLN: 4.0000 mg | Freq: Four times a day (QID) | INTRAMUSCULAR | Status: DC | PRN

## 2017-03-28 MED ORDER — SODIUM CHLORIDE 0.9% FLUSH
3.0000 mL | Freq: Two times a day (BID) | INTRAVENOUS | Status: DC
  Administered 2017-03-28 – 2017-03-29 (×2): 3 mL via INTRAVENOUS

## 2017-03-28 MED ORDER — SODIUM CHLORIDE 0.9 % IV SOLN: INTRAVENOUS | Status: DC

## 2017-03-28 MED ORDER — ASPIRIN 300 MG RE SUPP: 300.0000 mg | RECTAL | Status: AC

## 2017-03-28 MED ORDER — NITROGLYCERIN 2 % TD OINT: 1.0000 [in_us] | TOPICAL_OINTMENT | Freq: Three times a day (TID) | TRANSDERMAL | Status: DC

## 2017-03-28 MED ORDER — ZOLPIDEM TARTRATE 5 MG PO TABS
5.0000 mg | ORAL_TABLET | Freq: Every evening | ORAL | Status: DC | PRN
Start: 2017-03-28 — End: 2017-03-29
  Administered 2017-03-28: 5 mg via ORAL
  Filled 2017-03-28: qty 1

## 2017-03-28 MED ORDER — SODIUM CHLORIDE 0.9 % IV SOLN: 250.0000 mL | INTRAVENOUS | Status: DC | PRN

## 2017-03-28 MED ORDER — ASPIRIN EC 81 MG PO TBEC: 81.0000 mg | DELAYED_RELEASE_TABLET | Freq: Every day | ORAL | Status: DC

## 2017-03-28 MED ORDER — NITROGLYCERIN 0.4 MG SL SUBL: 0.4000 mg | SUBLINGUAL_TABLET | SUBLINGUAL | Status: DC | PRN

## 2017-03-28 MED ORDER — SODIUM CHLORIDE 0.9 % WEIGHT BASED INFUSION
3.0000 mL/kg/h | INTRAVENOUS | Status: DC
  Administered 2017-03-29: 3 mL/kg/h via INTRAVENOUS

## 2017-03-28 MED ORDER — HEPARIN (PORCINE) IN NACL 100-0.45 UNIT/ML-% IJ SOLN
1450.0000 [IU]/h | INTRAMUSCULAR | Status: DC
  Administered 2017-03-28: 1450 [IU]/h via INTRAVENOUS
  Filled 2017-03-28 (×2): qty 250

## 2017-03-28 MED ORDER — SODIUM CHLORIDE 0.9% FLUSH: 3.0000 mL | INTRAVENOUS | Status: DC | PRN

## 2017-03-28 MED ORDER — ACETAMINOPHEN 325 MG PO TABS
650.0000 mg | ORAL_TABLET | ORAL | Status: DC | PRN
Start: 2017-03-28 — End: 2017-03-29

## 2017-03-28 MED ORDER — NITROGLYCERIN IN D5W 200-5 MCG/ML-% IV SOLN
0.0000 ug/min | INTRAVENOUS | Status: DC
  Administered 2017-03-28: 10 ug/min via INTRAVENOUS
  Filled 2017-03-28: qty 250

## 2017-03-28 MED ORDER — ASPIRIN 81 MG PO CHEW
81.0000 mg | CHEWABLE_TABLET | ORAL | Status: AC
  Administered 2017-03-29: 81 mg via ORAL
  Filled 2017-03-28 (×2): qty 1

## 2017-03-28 MED ORDER — SODIUM CHLORIDE 0.9 % WEIGHT BASED INFUSION: 1.0000 mL/kg/h | INTRAVENOUS | Status: DC

## 2017-03-28 MED ORDER — ALPRAZOLAM 0.25 MG PO TABS: 0.2500 mg | ORAL_TABLET | Freq: Two times a day (BID) | ORAL | Status: DC | PRN

## 2017-03-28 NOTE — Progress Notes (Signed)
ANTICOAGULATION CONSULT NOTE - Initial Consult  Pharmacy Consult for Heparin Indication: chest pain/ACS  Allergies  Allergen Reactions  . Niacin And Related Other (See Comments)    [place]   . Tagamet [Cimetidine] Other (See Comments)    unknown    Patient Measurements: Height: 5\' 11"  (180.3 cm) Weight: 267 lb 4.8 oz (121.2 kg) IBW/kg (Calculated) : 75.3 Heparin Dosing Weight: 102.1 kg  Vital Signs: Temp: 98.9 F (37.2 C) (10/29 1726) Temp Source: Oral (10/29 1726) BP: 166/84 (10/29 1726) Pulse Rate: 54 (10/29 1726)  Labs: No results for input(s): HGB, HCT, PLT, APTT, LABPROT, INR, HEPARINUNFRC, HEPRLOWMOCWT, CREATININE, CKTOTAL, CKMB, TROPONINI in the last 72 hours.  CrCl cannot be calculated (Patient's most recent lab result is older than the maximum 21 days allowed.).   Medical History: Past Medical History:  Diagnosis Date  . Allergic rhinitis   . Arthritis    "a little bit in some of my joints" (06/20/2014)  . Coronary artery disease    a. 05/2014 Cath/PCI: LM nl, LAD 40p, 60-70d (FFR 0.80->3.0x24 Synergy DES), D2 40, LCX 90d (2.25x12 Synergy DES), RCA 20-73m, EF 60-65%.  . Depression   . Dilated aortic root ([facility])    aortic root 89mm and ascending aorta 47mm by echo 10/2016  . ED (erectile dysfunction)   . Environmental allergies    dry cough  . GERD (gastroesophageal reflux disease)   . History of hiatal hernia   . Hydronephrosis of right kidney    a. s/p ureteral stenting in the past.  . Hyperlipidemia    under control  . Hypertension   . Hypothyroidism   . OCD (obsessive compulsive disorder)   . Panic disorder   . PONV (postoperative nausea and vomiting)    after thyroid surgery no problems in 10 years   . Prostate cancer [facility])    prostate cancer- robotic prostatectomy - followed by radiation because of positive lymph node--and pt on lupron injections  . Sinus bradycardia    usually in 50's  . SUI (stress urinary incontinence), male    S/P ROBOTIC  PROSTATECTOMY AND RADIATION TX    Assessment: CC/HPI: worsening SOB, CP  PMH: HTN, dyslipidemia, dilated aortic root, severe 2 vessel ASCAD, prostate cancer , allergies, arthritis, CAD, depression, ED, GERD, HH, R kidney hydronephrosis, HLD, HTN, hypothyroid, OCD, panic disorder, sinus brady, stress urinary incontinence.  Anticoag: Start IV heparin for USAP  Goal of Therapy:  Heparin level 0.3-0.7 units/ml Monitor platelets by anticoagulation protocol: Yes   Plan:  F/u baseline labs Start IV heparin 4000 unit bolus Heparin infusion at 1450 units/hr Daily HL and CBC  Blake Carter, PharmD, BCPS Clinical Staff Pharmacist Pager [phone]  Blake Carter 03/28/2017,5:57 PM

## 2017-03-28 NOTE — Progress Notes (Signed)
Received pt as direct admit from cardiologist office into room 6E22. Awake, aqlert, and oriented x 4. SB on the monitor. Report chest discomfort 3/10. Admission ordered released. Will start heparin and nitroglycerin drip. Dr Golden Hurter in to assess. Leveda Anna, BSN, RN

## 2017-03-28 NOTE — Progress Notes (Signed)
Cardiology Office Note   Date:  03/28/2017   ID:  Blake Carter, DOB [birth year], MRN [id number]  PCP:  Hulan Fess, MD  Cardiologist:  Dr. Radford Pax    Chief Complaint  Patient presents with  . Chest Pain    SOB      History of Present Illness: Berish Bohman is a 66 y.o. male who presents for angina.   He has a history of HTN, dyslipidemia, dilated aortic root, severe 2 vessel ASCAD with 60-70% mid LAD with positive FFR and 90% distal left circ s/p DES to the LAD and distal LCX. He is on DAPT with ASA/Plavix. He had a nuclear stress test in 08/2015 that showed no ischemia. He has prostate cancer with removal and elevated PSA for tests in several months.  Also with HR varies 32 to 60.    He has been doing well    Today he is here for chest pain and SOB.  Over last month crescendo anginaand DOE even with minimal walking.  Yesterday at the races he had to walk up high hill and had to stop 3 times due to chest tightness and sob.  It did resolve with rest but today walking of flat area he is having SOB and chest pressure.  Currently here he is stable.  He is concerned these symptoms are new for him.  They are increasing -has not woken from sleep and no associated symptoms of nausea or diaphoresis.  He feels weak today.     Past Medical History:  Diagnosis Date  . Allergic rhinitis   . Arthritis    "a little bit in some of my joints" (06/20/2014)  . Coronary artery disease    a. 05/2014 Cath/PCI: LM nl, LAD 40p, 60-70d (FFR 0.80->3.0x24 Synergy DES), D2 40, LCX 90d (2.25x12 Synergy DES), RCA 20-34m, EF 60-65%.  . Depression   . Dilated aortic root (HCC)    aortic root 24mm and ascending aorta 97mm by echo 10/2016  . ED (erectile dysfunction)   . Environmental allergies    dry cough  . GERD (gastroesophageal reflux disease)   . History of hiatal hernia   . Hydronephrosis of right kidney    a. s/p ureteral stenting in the past.  . Hyperlipidemia    under control  .  Hypertension   . Hypothyroidism   . OCD (obsessive compulsive disorder)   . Panic disorder   . PONV (postoperative nausea and vomiting)    after thyroid surgery no problems in 10 years   . Prostate cancer [facility])    prostate cancer- robotic prostatectomy - followed by radiation because of positive lymph node--and pt on lupron injections  . Sinus bradycardia    usually in 50's  . SUI (stress urinary incontinence), male    S/P ROBOTIC PROSTATECTOMY AND RADIATION TX    Past Surgical History:  Procedure Laterality Date  . COLONOSCOPY  07/2013   Dr. Oletta Lamas  . CORONARY ANGIOPLASTY WITH STENT PLACEMENT  06/20/2014   "2"  . CYSTOSCOPY  11/30/2011   Procedure: CYSTOSCOPY;  Surgeon: Reece Packer, MD;  Location: [facility];  Service: Urology;  Laterality: N/A;  Inplantation of Artificial Sphincter and Cystoscopy  . CYSTOSCOPY WITH RETROGRADE PYELOGRAM, URETEROSCOPY AND STENT PLACEMENT Right 02/11/2014   Procedure: CYSTOSCOPY WITH RETROGRADE PYELOGRAM, URETEROSCOPY ,URETERAL BIOPSY AND STENT PLACEMENT;  Surgeon: Raynelle Bring, MD;  Location: [facility];  Service: Urology;  Laterality: Right;  . CYSTOSCOPY WITH RETROGRADE PYELOGRAM, URETEROSCOPY AND STENT PLACEMENT Right 04/15/2014  Procedure: CYSTOSCOPY WITH RETROGRADE PYELOGRAM, URETEROSCOPY, BIOPSY AND STENT PLACEMENT;  Surgeon: Raynelle Bring, MD;  [facility]: [facility];  Service: Urology;  Laterality: Right;  . LEFT HEART CATHETERIZATION WITH CORONARY ANGIOGRAM N/A 06/20/2014   Procedure: LEFT HEART CATHETERIZATION WITH CORONARY ANGIOGRAM;  Surgeon: Leonie Man, MD;  Location: [facility] CATH LAB;  Service: Cardiovascular;  Laterality: N/A;  . PERCUTANEOUS CORONARY STENT INTERVENTION (PCI-S)  06/20/2014   Procedure: PERCUTANEOUS CORONARY STENT INTERVENTION (PCI-S);  Surgeon: Leonie Man, MD;  Location: [facility] CATH LAB;  Service: Cardiovascular;;  LAD and Circumflex  . REFRACTIVE SURGERY Bilateral 2004  . ROBOT ASSISTED LAPAROSCOPIC RADICAL PROSTATECTOMY  05/2010    . THYROIDECTOMY, PARTIAL  10/1974  . TONSILLECTOMY  1956   . TOTAL THYROIDECTOMY  10/2012   "nuked it"  . UPPER GI ENDOSCOPY     food impaction 2009 done by Dr Oletta Lamas  . URINARY SPHINCTER IMPLANT  11/30/2011   Procedure: ARTIFICIAL URINARY SPHINCTER;  Surgeon: Reece Packer, MD;  Location: [facility];  Service: Urology;  Laterality: N/A;     Current Outpatient Prescriptions  Medication Sig Dispense Refill  . aspirin EC 81 MG tablet Take 81 mg by mouth daily.    Marland Kitchen atorvastatin (LIPITOR) 40 MG tablet TAKE 1 TABLET EVERY MORNING 90 tablet 3  . Cholecalciferol (VITAMIN D3) 2000 units TABS Take 2,000 Units by mouth daily.    Marland Kitchen Fexofenadine HCl (ALLEGRA PO) Take 180 mg by mouth daily.     . fluvoxaMINE (LUVOX) 50 MG tablet Take 150 mg by mouth every morning.     Marland Kitchen gemfibrozil (LOPID) 600 MG tablet Take 600 mg by mouth 2 (two) times daily.     Marland Kitchen latanoprost (XALATAN) 0.005 % ophthalmic solution Place 1 drop into both eyes at bedtime.     Marland Kitchen levothyroxine (SYNTHROID, LEVOTHROID) 175 MCG tablet Take 175 mcg by mouth daily before breakfast.    . lisinopril (PRINIVIL,ZESTRIL) 10 MG tablet TAKE 1 TABLET EVERY MORNING 90 tablet 3  . nitroGLYCERIN (NITROSTAT) 0.4 MG SL tablet PLACE 1 TABLET (0.4 MG TOTAL) UNDER THE TONGUE EVERY 5 (FIVE) MINUTES AS NEEDED FOR CHEST PAIN. 25 tablet 1  . pantoprazole (PROTONIX) 40 MG tablet Take 2 tablets (80 mg total) by mouth daily. 180 tablet 2  . zolpidem (AMBIEN) 10 MG tablet Take 10 mg by mouth at bedtime as needed for sleep.      No current facility-administered medications for this visit.     Allergies:   Niacin and related and Tagamet [cimetidine]    Social History:  The patient  reports that he has never smoked. He has never used smokeless tobacco. He reports that he drinks alcohol. He reports that he does not use drugs.   Family History:  The patient's family history includes CAD in his brother; Cancer in his father; Heart disease in his father;  Hyperlipidemia in his father.    ROS:  General:no colds or fevers,+ weight gain Skin:no rashes or ulcers HEENT:no blurred vision, no congestion CV:see HPI PUL:see HPI GI:no diarrhea constipation or melena, no indigestion GU:no hematuria, no dysuria MS:no joint pain, no claudication Neuro:no syncope, no lightheadedness Endo:no diabetes, + thyroid disease  Wt Readings from Last 3 Encounters:  03/28/17 273 lb (123.8 kg)  10/11/16 265 lb 12.8 oz (120.6 kg)  03/17/16 258 lb 1.9 oz (117.1 kg)     PHYSICAL EXAM: VS:  BP 132/88 (BP Location: Right Arm)   Pulse (!) 52   Ht 5\' 11"  (1.803 m)  Wt 273 lb (123.8 kg)   BMI 38.08 kg/m  , BMI Body mass index is 38.08 kg/m. General:Pleasant affect, NAD Skin:Warm and dry, brisk capillary refill HEENT:normocephalic, sclera clear, mucus membranes moist Neck:supple, no JVD, no bruits  Heart:S1S2 RRR without murmur, gallup, rub or click Lungs:clear without rales, rhonchi, or wheezes QQV:ZDGL, non tender, + BS, do not palpate liver spleen or masses Ext:no lower ext edema, 2+ pedal pulses, 2+ radial pulses Neuro:alert and oriented X 3, MAE, follows commands, + facial symmetry    EKG:  EKG is ordered today. The ekg ordered today demonstrates SB at 52 no acute changes   Recent Labs: 10/11/2016: ALT 25 11/12/2016: BUN 19; Creatinine, Ser 1.18; Potassium 4.6; Sodium 140; TSH 3.010    Lipid Panel    Component Value Date/Time   CHOL 140 10/11/2016 0853   TRIG 143 10/11/2016 0853   HDL 39 (L) 10/11/2016 0853   CHOLHDL 3.6 10/11/2016 0853   CHOLHDL 4.2 10/14/2015 0850   VLDL 40 (H) 10/14/2015 0850   LDLCALC 72 10/11/2016 0853       Other studies Reviewed: Additional studies/ records that were reviewed today include: . 08/2015  Study Highlights    Nuclear stress EF: 51%.  Blood pressure demonstrated a hypertensive response to exercise.  There was no ST segment deviation noted during stress.  No T wave inversion was noted during  stress.  The study is normal.  This is a low risk study.  The left ventricular ejection fraction is mildly decreased (45-54%).      ECHO Study Conclusions  - Left ventricle: The cavity size was normal. There was moderate   concentric hypertrophy. Systolic function was normal. The   estimated ejection fraction was in the range of 60% to 65%. Wall   motion was normal; there were no regional wall motion   abnormalities. There was an increased relative contribution of   atrial contraction to ventricular filling. Doppler parameters are   consistent with abnormal left ventricular relaxation (grade 1   diastolic dysfunction). - Aortic valve: Trileaflet; normal thickness, mildly calcified   leaflets. There was trivial regurgitation. - Aorta: Aortic root dimension: 40 mm (ED). Ascending aortic   diameter: 42 mm (S). - Aortic root: The aortic root was mildly dilated. - Ascending aorta: The ascending aorta was mildly dilated. - Mitral valve: Calcified annulus. There was trivial regurgitation. - Left atrium: The atrium was mildly dilated. - Right ventricle: The cavity size was mildly dilated. Wall   thickness was normal.  Impressions:  - Normal LVF with mildly dilated aortic root and ascending aorta   which is new compared to prior study.   ASSESSMENT AND PLAN:  1.  Crescendo angina over last month with known CAD, pt is worried about the symptoms.  Will admit to cool off angina and begin IV heparin.  Plan for cath in AM.  Discussed with Dr Rayann Heman and he agrees. Will add imdur over night.  The patient understands that risks included but are not limited to stroke (1 in 1000), death (1 in 11), kidney failure [usually temporary] (1 in 500), bleeding (1 in 200), allergic reaction [possibly serious] (1 in 200).   2.  CAD with 2 vessel ASCAD in 2016 with 60-70% mid LAD with positive FFR and 90% distal left circ s/p DES to the LAD and distal LCX. He is on DAPT with ASA/Plavix   3.   Stable brady at times to 32 but no syncope  4.  HLD continue statin.  5.  Hx prostate cancer.  6. Hx dilated aortic root. - 92mm of echo 10/2015.    Current medicines are reviewed with the patient today.  The patient Has no concerns regarding medicines.  The following changes have been made:  See above Labs/ tests ordered today include:see above  Disposition:   FU:  see above  Signed, Cecilie Kicks, NP  03/28/2017 3:03 PM    [facility] [place], [place], [place] Phone: [phone]; Fax: [phone]

## 2017-03-28 NOTE — Telephone Encounter (Signed)
New message    Pt c/o Shortness Of Breath: STAT if SOB developed within the last 24 hours or pt is noticeably SOB on the phone  1. Are you currently SOB (can you hear that pt is SOB on the phone)? No   2. How long have you been experiencing SOB? Getting progressively worse for 2-3 weeks.   3. Are you SOB when sitting or when up moving around? Moving around.   4. Are you currently experiencing any other symptoms? Some chest pain yesterday while walking up a hill.

## 2017-03-28 NOTE — H&P (Signed)
HISTORY AND PHYSICAL   Date:  03/28/2017   ID:  Blake Carter, DOB [birth year], MRN [id number]  PCP:  Hulan Fess, MD            Cardiologist:  Dr. Radford Pax        Chief Complaint  Patient presents with  . Chest Pain    SOB      History of Present Illness: Blake Carter is a 66 y.o. male who presents for angina.   He has a history of HTN, dyslipidemia, dilated aortic root, severe 2 vessel ASCAD with 60-70% mid LAD with positive FFR and 90% distal left circ s/p DES to the LAD and distal LCX. He is on DAPT with ASA/Plavix. He had a nuclear stress test in 4/2017that showed no ischemia. He has prostate cancer with removal and elevated PSA for tests in several months.  Also with HR varies 32 to 60.    He has been doing well    Today he is here for chest pain and SOB.  Over last month crescendo anginaand DOE even with minimal walking.  Yesterday at the races he had to walk up high hill and had to stop 3 times due to chest tightness and sob.  It did resolve with rest but today walking of flat area he is having SOB and chest pressure.  Currently here he is stable.  He is concerned these symptoms are new for him.  They are increasing -has not woken from sleep and no associated symptoms of nausea or diaphoresis.  He feels weak today.         Past Medical History:  Diagnosis Date  . Allergic rhinitis   . Arthritis    "a little bit in some of my joints" (06/20/2014)  . Coronary artery disease    a. 05/2014 Cath/PCI: LM nl, LAD 40p, 60-70d (FFR 0.80->3.0x24 Synergy DES), D2 40, LCX 90d (2.25x12 Synergy DES), RCA 20-43m, EF 60-65%.  . Depression   . Dilated aortic root (HCC)    aortic root 43mm and ascending aorta 25mm by echo 10/2016  . ED (erectile dysfunction)   . Environmental allergies    dry cough  . GERD (gastroesophageal reflux disease)   . History of hiatal hernia   . Hydronephrosis of right kidney    a. s/p ureteral stenting in the past.  .  Hyperlipidemia    under control  . Hypertension   . Hypothyroidism   . OCD (obsessive compulsive disorder)   . Panic disorder   . PONV (postoperative nausea and vomiting)    after thyroid surgery no problems in 10 years   . Prostate cancer [facility])    prostate cancer- robotic prostatectomy - followed by radiation because of positive lymph node--and pt on lupron injections  . Sinus bradycardia    usually in 50's  . SUI (stress urinary incontinence), male    S/P ROBOTIC PROSTATECTOMY AND RADIATION TX         Past Surgical History:  Procedure Laterality Date  . COLONOSCOPY  07/2013   Dr. Oletta Lamas  . CORONARY ANGIOPLASTY WITH STENT PLACEMENT  06/20/2014   "2"  . CYSTOSCOPY  11/30/2011   Procedure: CYSTOSCOPY;  Surgeon: Reece Packer, MD;  Location: [facility];  Service: Urology;  Laterality: N/A;  Inplantation of Artificial Sphincter and Cystoscopy  . CYSTOSCOPY WITH RETROGRADE PYELOGRAM, URETEROSCOPY AND STENT PLACEMENT Right 02/11/2014   Procedure: CYSTOSCOPY WITH RETROGRADE PYELOGRAM, URETEROSCOPY ,URETERAL BIOPSY AND STENT PLACEMENT;  Surgeon: Raynelle Bring, MD;  Location: [facility];  Service: Urology;  Laterality: Right;  . CYSTOSCOPY WITH RETROGRADE PYELOGRAM, URETEROSCOPY AND STENT PLACEMENT Right 04/15/2014   Procedure: CYSTOSCOPY WITH RETROGRADE PYELOGRAM, URETEROSCOPY, BIOPSY AND STENT PLACEMENT;  Surgeon: Raynelle Bring, MD;  Location: [facility];  Service: Urology;  Laterality: Right;  . LEFT HEART CATHETERIZATION WITH CORONARY ANGIOGRAM N/A 06/20/2014   Procedure: LEFT HEART CATHETERIZATION WITH CORONARY ANGIOGRAM;  Surgeon: Leonie Man, MD;  Location: [facility] CATH LAB;  Service: Cardiovascular;  Laterality: N/A;  . PERCUTANEOUS CORONARY STENT INTERVENTION (PCI-S)  06/20/2014   Procedure: PERCUTANEOUS CORONARY STENT INTERVENTION (PCI-S);  Surgeon: Leonie Man, MD;  Location: [facility] CATH LAB;  Service: Cardiovascular;;  LAD and Circumflex  . REFRACTIVE SURGERY  Bilateral 2004  . ROBOT ASSISTED LAPAROSCOPIC RADICAL PROSTATECTOMY  05/2010  . THYROIDECTOMY, PARTIAL  10/1974  . TONSILLECTOMY  1956   . TOTAL THYROIDECTOMY  10/2012   "nuked it"  . UPPER GI ENDOSCOPY     food impaction 2009 done by Dr Oletta Lamas  . URINARY SPHINCTER IMPLANT  11/30/2011   Procedure: ARTIFICIAL URINARY SPHINCTER;  Surgeon: Reece Packer, MD;  Location: [facility];  Service: Urology;  Laterality: N/A;           Current Outpatient Prescriptions  Medication Sig Dispense Refill  . aspirin EC 81 MG tablet Take 81 mg by mouth daily.    Marland Kitchen atorvastatin (LIPITOR) 40 MG tablet TAKE 1 TABLET EVERY MORNING 90 tablet 3  . Cholecalciferol (VITAMIN D3) 2000 units TABS Take 2,000 Units by mouth daily.    Marland Kitchen Fexofenadine HCl (ALLEGRA PO) Take 180 mg by mouth daily.     . fluvoxaMINE (LUVOX) 50 MG tablet Take 150 mg by mouth every morning.     Marland Kitchen gemfibrozil (LOPID) 600 MG tablet Take 600 mg by mouth 2 (two) times daily.     Marland Kitchen latanoprost (XALATAN) 0.005 % ophthalmic solution Place 1 drop into both eyes at bedtime.     Marland Kitchen levothyroxine (SYNTHROID, LEVOTHROID) 175 MCG tablet Take 175 mcg by mouth daily before breakfast.    . lisinopril (PRINIVIL,ZESTRIL) 10 MG tablet TAKE 1 TABLET EVERY MORNING 90 tablet 3  . nitroGLYCERIN (NITROSTAT) 0.4 MG SL tablet PLACE 1 TABLET (0.4 MG TOTAL) UNDER THE TONGUE EVERY 5 (FIVE) MINUTES AS NEEDED FOR CHEST PAIN. 25 tablet 1  . pantoprazole (PROTONIX) 40 MG tablet Take 2 tablets (80 mg total) by mouth daily. 180 tablet 2  . zolpidem (AMBIEN) 10 MG tablet Take 10 mg by mouth at bedtime as needed for sleep.      No current facility-administered medications for this visit.     Allergies:   Niacin and related and Tagamet [cimetidine]    Social History:  The patient  reports that he has never smoked. He has never used smokeless tobacco. He reports that he drinks alcohol. He reports that he does not use drugs.   Family History:   The patient's family history includes CAD in his brother; Cancer in his father; Heart disease in his father; Hyperlipidemia in his father.    ROS:  General:no colds or fevers,+ weight gain Skin:no rashes or ulcers HEENT:no blurred vision, no congestion CV:see HPI PUL:see HPI GI:no diarrhea constipation or melena, no indigestion GU:no hematuria, no dysuria MS:no joint pain, no claudication Neuro:no syncope, no lightheadedness Endo:no diabetes, + thyroid disease     Wt Readings from Last 3 Encounters:  03/28/17 273 lb (123.8 kg)  10/11/16 265 lb 12.8 oz (120.6 kg)  03/17/16 258 lb  1.9 oz (117.1 kg)     PHYSICAL EXAM: VS:  BP 132/88 (BP Location: Right Arm)   Pulse (!) 52   Ht 5\' 11"  (1.803 m)   Wt 273 lb (123.8 kg)   BMI 38.08 kg/m  , BMI Body mass index is 38.08 kg/m. General:Pleasant affect, NAD Skin:Warm and dry, brisk capillary refill HEENT:normocephalic, sclera clear, mucus membranes moist Neck:supple, no JVD, no bruits  Heart:S1S2 RRR without murmur, gallup, rub or click Lungs:clear without rales, rhonchi, or wheezes GYK:ZLDJ, non tender, + BS, do not palpate liver spleen or masses Ext:no lower ext edema, 2+ pedal pulses, 2+ radial pulses Neuro:alert and oriented X 3, MAE, follows commands, + facial symmetry    EKG:  EKG is ordered today. The ekg ordered today demonstrates SB at 52 no acute changes   Recent Labs: 10/11/2016: ALT 25 11/12/2016: BUN 19; Creatinine, Ser 1.18; Potassium 4.6; Sodium 140; TSH 3.010    Lipid Panel Labs (Brief)          Component Value Date/Time   CHOL 140 10/11/2016 0853   TRIG 143 10/11/2016 0853   HDL 39 (L) 10/11/2016 0853   CHOLHDL 3.6 10/11/2016 0853   CHOLHDL 4.2 10/14/2015 0850   VLDL 40 (H) 10/14/2015 0850   LDLCALC 72 10/11/2016 0853         Other studies Reviewed: Additional studies/ records that were reviewed today include: . 08/2015  Study Highlights    Nuclear stress EF:  51%.  Blood pressure demonstrated a hypertensive response to exercise.  There was no ST segment deviation noted during stress.  No T wave inversion was noted during stress.  The study is normal.  This is a low risk study.  The left ventricular ejection fraction is mildly decreased (45-54%).     ECHO Study Conclusions  - Left ventricle: The cavity size was normal. There was moderate concentric hypertrophy. Systolic function was normal. The estimated ejection fraction was in the range of 60% to 65%. Wall motion was normal; there were no regional wall motion abnormalities. There was an increased relative contribution of atrial contraction to ventricular filling. Doppler parameters are consistent with abnormal left ventricular relaxation (grade 1 diastolic dysfunction). - Aortic valve: Trileaflet; normal thickness, mildly calcified leaflets. There was trivial regurgitation. - Aorta: Aortic root dimension: 40 mm (ED). Ascending aortic diameter: 42 mm (S). - Aortic root: The aortic root was mildly dilated. - Ascending aorta: The ascending aorta was mildly dilated. - Mitral valve: Calcified annulus. There was trivial regurgitation. - Left atrium: The atrium was mildly dilated. - Right ventricle: The cavity size was mildly dilated. Wall thickness was normal.  Impressions:  - Normal LVF with mildly dilated aortic root and ascending aorta which is new compared to prior study.   ASSESSMENT AND PLAN:  1.  Crescendo angina over last month with known CAD, pt is worried about the symptoms.  Will admit to cool off angina and begin IV heparin.  Plan for cath in AM.  Discussed with Dr Rayann Heman and he agrees. Will add imdur over night.  The patient understands that risks included but are not limited to stroke (1 in 1000), death (1 in 6), kidney failure [usually temporary] (1 in 500), bleeding (1 in 200), allergic reaction [possibly serious] (1 in 200).    2.  CAD with 2 vessel ASCAD in 2016 with 60-70% mid LAD with positive FFR and 90% distal left circ s/p DES to the LAD and distal LCX. He is on DAPT with ASA/Plavix  3.  Stable brady at times to 32 but no syncope  4.  HLD continue statin.     5.  Hx prostate cancer.  6. Hx dilated aortic root. - 61mm of echo 10/2015.    Current medicines are reviewed with the patient today.  The patient Has no concerns regarding medicines.  The following changes have been made:  See above Labs/ tests ordered today include:see above  Disposition:   FU:  see above  Signed, Cecilie Kicks, NP  03/28/2017 3:03 PM    [facility] [place], [place], [place] Phone: [phone]; Fax: [phone]

## 2017-03-28 NOTE — Telephone Encounter (Signed)
Spoke with patient who states he has been having progressively worsening SOB over the last few weeks. He states he yesterday he walked up a hill that he walks up frequently without distress and had chest pain when he got to the top. He states it resolved after a few minutes, did not take NTG. He states he has not had any CP since yesterday. He states today he is anxious about his symptoms and "just feels unsettled." I advised that there are no morning appointments available but that I can schedule him to see Cecilie Kicks, NP at 2 pm today. He verbalized agreement with this appointment time and agrees to go to the hospital if his pain returns or if symptoms worsen. He thanked me for the call.

## 2017-03-28 NOTE — Patient Instructions (Signed)
Medication Instructions:  Your physician recommends that you continue on your current medications as directed. Please refer to the Current Medication list given to you today.  Labwork: None  Testing/Procedures: None  Follow-Up: Follow up will be determined after hospital stay.  Any Other Special Instructions Will Be Listed Below (If Applicable).  You are being admitted to [facility].    If you need a refill on your cardiac medications before your next appointment, please call your pharmacy.

## 2017-03-29 ENCOUNTER — Encounter (HOSPITAL_COMMUNITY): Payer: Self-pay

## 2017-03-29 ENCOUNTER — Other Ambulatory Visit: Payer: Self-pay | Admitting: Physician Assistant

## 2017-03-29 ENCOUNTER — Other Ambulatory Visit: Payer: Self-pay

## 2017-03-29 ENCOUNTER — Ambulatory Visit (HOSPITAL_COMMUNITY)
Admission: AD | Disposition: A | Discharge status: Home / Self Care | Payer: Self-pay | Source: Ambulatory Visit | Attending: Cardiology

## 2017-03-29 ENCOUNTER — Ambulatory Visit (HOSPITAL_COMMUNITY): Admission: RE | Admit: 2017-03-29 | Payer: BC Managed Care – PPO | Source: Ambulatory Visit | Admitting: Cardiology

## 2017-03-29 DIAGNOSIS — R001 Bradycardia, unspecified: Secondary | ICD-10-CM | POA: Diagnosis not present

## 2017-03-29 DIAGNOSIS — I5031 Acute diastolic (congestive) heart failure: Secondary | ICD-10-CM | POA: Diagnosis not present

## 2017-03-29 DIAGNOSIS — I2510 Atherosclerotic heart disease of native coronary artery without angina pectoris: Secondary | ICD-10-CM | POA: Diagnosis not present

## 2017-03-29 DIAGNOSIS — R079 Chest pain, unspecified: Secondary | ICD-10-CM

## 2017-03-29 DIAGNOSIS — E7800 Pure hypercholesterolemia, unspecified: Secondary | ICD-10-CM

## 2017-03-29 DIAGNOSIS — I2583 Coronary atherosclerosis due to lipid rich plaque: Secondary | ICD-10-CM | POA: Diagnosis not present

## 2017-03-29 DIAGNOSIS — I25110 Atherosclerotic heart disease of native coronary artery with unstable angina pectoris: Secondary | ICD-10-CM | POA: Diagnosis not present

## 2017-03-29 DIAGNOSIS — I110 Hypertensive heart disease with heart failure: Secondary | ICD-10-CM | POA: Diagnosis not present

## 2017-03-29 DIAGNOSIS — R0789 Other chest pain: Secondary | ICD-10-CM | POA: Diagnosis not present

## 2017-03-29 DIAGNOSIS — I10 Essential (primary) hypertension: Secondary | ICD-10-CM

## 2017-03-29 DIAGNOSIS — E785 Hyperlipidemia, unspecified: Secondary | ICD-10-CM | POA: Diagnosis not present

## 2017-03-29 DIAGNOSIS — I200 Unstable angina: Secondary | ICD-10-CM | POA: Diagnosis not present

## 2017-03-29 DIAGNOSIS — R0602 Shortness of breath: Secondary | ICD-10-CM

## 2017-03-29 HISTORY — PX: LEFT HEART CATH AND CORONARY ANGIOGRAPHY: CATH118249

## 2017-03-29 LAB — BASIC METABOLIC PANEL
Anion gap: 10 (ref 5–15)
BUN: 14 mg/dL (ref 6–20)
CO2: 23 mmol/L (ref 22–32)
Calcium: 8.6 mg/dL — ABNORMAL LOW (ref 8.9–10.3)
Chloride: 106 mmol/L (ref 101–111)
Creatinine, Ser: 0.98 mg/dL (ref 0.61–1.24)
GFR calc Af Amer: >60 mL/min (ref >60)
GFR calc non Af Amer: >60 mL/min (ref >60)
Glucose, Bld: 106 mg/dL — ABNORMAL HIGH (ref 65–99)
Potassium: 3.8 mmol/L (ref 3.5–5.1)
Sodium: 139 mmol/L (ref 135–145)

## 2017-03-29 LAB — LIPID PANEL
Cholesterol: 124 mg/dL (ref 0–200)
HDL: 33 mg/dL — ABNORMAL LOW (ref >40)
LDL Cholesterol: 57 mg/dL (ref 0–99)
Total CHOL/HDL Ratio: 3.8 RATIO
Triglycerides: 169 mg/dL — ABNORMAL HIGH (ref <150)
VLDL: 34 mg/dL (ref 0–40)

## 2017-03-29 LAB — CBC
HCT: 39.2 % (ref 39.0–52.0)
Hemoglobin: 12.8 g/dL — ABNORMAL LOW (ref 13.0–17.0)
MCH: 29 pg (ref 26.0–34.0)
MCHC: 32.7 g/dL (ref 30.0–36.0)
MCV: 88.9 fL (ref 78.0–100.0)
Platelets: 199 10*3/uL (ref 150–400)
RBC: 4.41 MIL/uL (ref 4.22–5.81)
RDW: 13.7 % (ref 11.5–15.5)
WBC: 7 10*3/uL (ref 4.0–10.5)

## 2017-03-29 LAB — TROPONIN I
Troponin I: <0.03 ng/mL (ref <0.03)
Troponin I: <0.03 ng/mL (ref <0.03)

## 2017-03-29 LAB — HIV ANTIBODY (ROUTINE TESTING W REFLEX): HIV Screen 4th Generation wRfx: NONREACTIVE

## 2017-03-29 LAB — HEPARIN LEVEL (UNFRACTIONATED): Heparin Unfractionated: 0.52 IU/mL (ref 0.30–0.70)

## 2017-03-29 LAB — D-DIMER, QUANTITATIVE: D-Dimer, Quant: 0.27 ug/mL-FEU (ref 0.00–0.50)

## 2017-03-29 SURGERY — LEFT HEART CATH AND CORONARY ANGIOGRAPHY
Anesthesia: LOCAL

## 2017-03-29 MED ORDER — MIDAZOLAM HCL 2 MG/2ML IJ SOLN
INTRAMUSCULAR | Status: AC
  Filled 2017-03-29: qty 2

## 2017-03-29 MED ORDER — HEPARIN SODIUM (PORCINE) 1000 UNIT/ML IJ SOLN
INTRAMUSCULAR | Status: AC
  Filled 2017-03-29: qty 1

## 2017-03-29 MED ORDER — LIDOCAINE HCL (PF) 1 % IJ SOLN
INTRAMUSCULAR | Status: DC | PRN
  Administered 2017-03-29: 3 mL via INTRADERMAL

## 2017-03-29 MED ORDER — FUROSEMIDE 20 MG PO TABS: ORAL_TABLET | ORAL | 0 refills | Status: DC

## 2017-03-29 MED ORDER — SODIUM CHLORIDE 0.9% FLUSH
3.0000 mL | INTRAVENOUS | Status: DC | PRN
Start: 2017-03-29 — End: 2017-03-29

## 2017-03-29 MED ORDER — HEPARIN SODIUM (PORCINE) 1000 UNIT/ML IJ SOLN
INTRAMUSCULAR | Status: DC | PRN
  Administered 2017-03-29: 6000 [IU] via INTRAVENOUS

## 2017-03-29 MED ORDER — HEPARIN (PORCINE) IN NACL 2-0.9 UNIT/ML-% IJ SOLN
INTRAMUSCULAR | Status: DC | PRN
  Administered 2017-03-29: 10 mL via INTRA_ARTERIAL

## 2017-03-29 MED ORDER — SODIUM CHLORIDE 0.9% FLUSH: 3.0000 mL | Freq: Two times a day (BID) | INTRAVENOUS | Status: DC

## 2017-03-29 MED ORDER — SODIUM CHLORIDE 0.9 % IV SOLN: 250.0000 mL | INTRAVENOUS | Status: DC | PRN

## 2017-03-29 MED ORDER — POTASSIUM CHLORIDE CRYS ER 20 MEQ PO TBCR
20.0000 meq | EXTENDED_RELEASE_TABLET | Freq: Once | ORAL | Status: AC
  Administered 2017-03-29: 20 meq via ORAL
  Filled 2017-03-29: qty 1

## 2017-03-29 MED ORDER — IOPAMIDOL (ISOVUE-370) INJECTION 76%
INTRAVENOUS | Status: AC
  Filled 2017-03-29: qty 100

## 2017-03-29 MED ORDER — FENTANYL CITRATE (PF) 100 MCG/2ML IJ SOLN
INTRAMUSCULAR | Status: AC
  Filled 2017-03-29: qty 2

## 2017-03-29 MED ORDER — LIDOCAINE HCL 2 % IJ SOLN
INTRAMUSCULAR | Status: AC
  Filled 2017-03-29: qty 20

## 2017-03-29 MED ORDER — MIDAZOLAM HCL 2 MG/2ML IJ SOLN
INTRAMUSCULAR | Status: DC | PRN
  Administered 2017-03-29: 2 mg via INTRAVENOUS

## 2017-03-29 MED ORDER — IOPAMIDOL (ISOVUE-370) INJECTION 76%
INTRAVENOUS | Status: DC | PRN
  Administered 2017-03-29: 75 mL via INTRAVENOUS

## 2017-03-29 MED ORDER — HEPARIN (PORCINE) IN NACL 2-0.9 UNIT/ML-% IJ SOLN
INTRAMUSCULAR | Status: AC | PRN
  Administered 2017-03-29: 1000 mL

## 2017-03-29 MED ORDER — HEPARIN (PORCINE) IN NACL 2-0.9 UNIT/ML-% IJ SOLN
INTRAMUSCULAR | Status: AC
  Filled 2017-03-29: qty 1000

## 2017-03-29 MED ORDER — FENTANYL CITRATE (PF) 100 MCG/2ML IJ SOLN
INTRAMUSCULAR | Status: DC | PRN
  Administered 2017-03-29: 50 ug via INTRAVENOUS

## 2017-03-29 MED ORDER — SODIUM CHLORIDE 0.9 % IV SOLN
INTRAVENOUS | Status: AC
  Administered 2017-03-29: 15:00:00 via INTRAVENOUS

## 2017-03-29 MED ORDER — FUROSEMIDE 40 MG PO TABS
40.0000 mg | ORAL_TABLET | Freq: Once | ORAL | Status: AC
  Administered 2017-03-29: 40 mg via ORAL
  Filled 2017-03-29: qty 1

## 2017-03-29 MED ORDER — VERAPAMIL HCL 2.5 MG/ML IV SOLN
INTRAVENOUS | Status: AC
  Filled 2017-03-29: qty 2

## 2017-03-29 SURGICAL SUPPLY — 10 items
CATH INFINITI 5FR ANG PIGTAIL (CATHETERS) ×2 IMPLANT
CATH OPTITORQUE TIG 4.0 5F (CATHETERS) ×2 IMPLANT
DEVICE RAD COMP TR BAND LRG (VASCULAR PRODUCTS) ×2 IMPLANT
GLIDESHEATH SLEND A-KIT 6F 22G (SHEATH) ×2 IMPLANT
GUIDEWIRE INQWIRE 1.5J.035X260 (WIRE) ×1 IMPLANT
INQWIRE 1.5J .035X260CM (WIRE) ×2
KIT HEART LEFT (KITS) ×2 IMPLANT
PACK CARDIAC CATHETERIZATION (CUSTOM PROCEDURE TRAY) ×2 IMPLANT
TRANSDUCER W/STOPCOCK (MISCELLANEOUS) ×2 IMPLANT
TUBING CIL FLEX 10 FLL-RA (TUBING) ×2 IMPLANT

## 2017-03-29 NOTE — Discharge Summary (Signed)
Discharge Summary    Patient ID: Blake Carter,  MRN: [id number], DOB/AGE: [birth year] 66 y.o.  Admit date: 03/28/2017 Discharge date: 03/29/2017  Primary Care Provider: Hulan Fess Primary Cardiologist: Dr. Radford Pax  Discharge Diagnoses    Principal Problem:   Exertional chest pain Active Problems:   Coronary artery disease due to lipid rich plaque   Acute diastolic CHF (congestive heart failure) (HCC)   Essential hypertension   Dyslipidemia   Dilated aortic root (HCC)   Pure hypercholesterolemia   Diagnostic Studies/Procedures    Procedures   LEFT HEART CATH AND CORONARY ANGIOGRAPHY  Conclusion     Mid LAD Synergy DES & dCx-LPL(OM2) Synergy DES from 07/2014, 0 %stenosed.  LV end diastolic pressure is moderately elevated.   Widely patent stents, with otherwise mild disease.  No culprit lesion to explain exertional chest pain. He just had an echocardiogram done with normal LV function.  LVEDP is mild to moderately elevated.  Plan: Defer to Dr. Radford Pax for ongoing management.  Consider treating diastolic heart failure. Continue aggressive Risk factor medication.    He should be able to discharge home today after bedrest, from a heart cath standpoint     _____________     History of Present Illness     Blake Carter is a 66 y.o. male with history of CAD (DES to LCx 05/2014 with residual LAD disease), HTN, dyslipidemia, prostate CA, sinus bradycardia, dilated aortic root, GERD, hiatal hernia, hypothyroidism s/p RAI, OCD who was admitted with chest pain and SOB.   Last nuc in 08/2015 showed no ischemia. Last echo 10/2016 showed EF 60-6%, grade 1 DD, aortic root dilation to 1mm and ascending aortic diameter to 71mm, mildly dilated RV. Prior notes indicate known bradycardia with HR in upper 30s-50s in the past, but relatively asymptomatic so this has been managed conservatively. 24 hour Holter 10/2016 showed Sinus bradycardia, sinus arrhythmia and normal sinus  rhythm with average heart rate 50bpm and heart rate ranged from 32 to 100bpm. He presented to the office yesterday with increasing chest discomfort and dyspnea with activity. He had no associated nausea or diaphoresis. Symptoms were felt concerning for angina so he was admitted for further evaluation.   Hospital Course    1. Chest discomfort/dyspnea, possibly related to acute diastolic CHF - troponins were negative. Cardiac cath demonstrated widely patent stents, otherwise mild disease, no lesion to explain symptoms. LVEDP was mild to moderately elevated, suggestive of diastolic CHF. Dr. Radford Pax recommended to start Lasix 40mg  daily x 2 days then reduce to 20mg  daily. He received a dose of 40mg  of Lasix and 68meq of KCl in the hospital prior to discharge. We discussed low sodium diet and fluid restriction. He does admit to recent salt load in the form of 6 hot dogs at [place] on Sunday 10/28. Prior potassium has ranged from 3.6 to 4.8, so will need BMET arranged at f/u. Dr. Radford Pax also recommended arranging outpatient PFTs; I placed order and sent msg to patient care coordinator to help arrange.   2. CAD - above. Clarified in notes, patient has been on ASA but Plavix was discontinued in 03/2016 after he completed course. Continue statin. Not on BB due to bradycardia.  3. Asymptomatic bradycardia at times to 32 per prior monitor but no syncope.  He is on no rate lowering meds. No specific bradycardic events in the hospital. If symptoms persist, could consider EP eval or ETT to assess chronotropic competence or correlation for symptoms.  4.HLD - continue statin. LDL at  goal at 57   5. Hx dilated aortic root. - dimension as above, following clinically.  Dr. Radford Pax has seen and examined the patient today and feels he is stable for discharge. He will be discharged after post-cath bedrest period is up. _____________  Discharge Vitals Blood pressure (!) 157/79, pulse (!) 48, temperature 98.2 F  (36.8 C), temperature source Oral, resp. rate 15, height 5\' 11"  (1.803 m), weight 269 lb (122 kg), SpO2 99 %.  Filed Weights   03/28/17 1726 03/29/17 0400  Weight: 267 lb 4.8 oz (121.2 kg) 269 lb (122 kg)    Labs & Radiologic Studies    CBC  Recent Labs  03/28/17 1748 03/29/17 0554  WBC 6.5 7.0  NEUTROABS 4.0  --   HGB 13.2 12.8*  HCT 39.8 39.2  MCV 88.6 88.9  PLT 217 621   Basic Metabolic Panel  Recent Labs  03/28/17 1748 03/29/17 0554  NA 138 139  K 3.6 3.8  CL 105 106  CO2 22 23  GLUCOSE 109* 106*  BUN 14 14  CREATININE 1.06 0.98  CALCIUM 8.7* 8.6*  MG 1.7  --    Liver Function Tests  Recent Labs  03/28/17 1748  AST 27  ALT 29  ALKPHOS 61  BILITOT 0.7  PROT 6.8  ALBUMIN 4.0   Cardiac Enzymes  Recent Labs  03/28/17 1748 03/29/17 0014 03/29/17 0554  TROPONINI <0.03 <0.03 <0.03   D-Dimer  Recent Labs  03/29/17 1430  DDIMER 0.27   Hemoglobin A1C  Recent Labs  03/28/17 1748  HGBA1C 5.8*   Fasting Lipid Panel  Recent Labs  03/29/17 0554  CHOL 124  HDL 33*  LDLCALC 57  TRIG 169*  CHOLHDL 3.8   Thyroid Function Tests  Recent Labs  03/28/17 1748  TSH 1.042   _____________  No results found. Disposition   Pt is being discharged home today in good condition.  Follow-up Plans & Appointments    Follow-up Information    Charlie Pitter, PA-C Follow up.   Specialties:  Cardiology, Radiology Why:  [facility] - 04/05/17 at 11:30am. Please arrive 15 minutes prior to appointment to register. Lisbeth Renshaw is one of the PAs that works with Dr. Radford Pax. Dr. Theodosia Blender office will also be calling to arrange pulmonary function testing. Contact information: [address] [place] [address] [phone]          Discharge Instructions    Diet - low sodium heart healthy    Complete by:  As directed    Increase activity slowly    Complete by:  As directed    Your cardiac cath showed stable heart arteries, but  the pressures in your heart were elevated suggestive of acute diastolic congestive heart failure. This is a condition where stiffness of the heart muscle can lead to some fluid buildup, causing chest discomfort and shortness of breath.   For patients with congestive heart failure, we give them these special instructions:  1. Follow a low-salt diet - you are allowed no more than 2,000mg  of sodium per day. Watch your fluid intake. In general, you should not be taking in more than 2 liters of fluid per day (no more than 8 glasses per day). This includes sources of water in foods like soup, coffee, tea, milk, etc. 2. Weigh yourself on the same scale at same time of day and keep a log. 3. Call your doctor: (Anytime you feel any of the following symptoms)  - 3lb weight  gain overnight or 5lb within a few days - Shortness of breath, with or without a dry hacking cough  - Swelling in the hands, feet or stomach  - If you have to sleep on extra pillows at night in order to breathe   IT IS IMPORTANT TO LET YOUR DOCTOR KNOW EARLY ON IF YOU ARE HAVING SYMPTOMS SO WE CAN HELP YOU!  You were started on a fluid pill called furosemide. This can cause you to lose potassium through the urine, so increase dietary intake of healthy sources of potassium including bananas, squash, yogurt, white beans, sweet potatoes, leafy greens, and avocados. We will recheck your level in follow-up to make sure you don't need a regular supplement.      Discharge Medications   Allergies as of 03/29/2017      Reactions   Niacin And Related Other (See Comments)   Flushing    Tagamet [cimetidine] Other (See Comments)   unknown      Medication List    TAKE these medications   aspirin EC 81 MG tablet Take 81 mg by mouth daily.   atorvastatin 40 MG tablet Commonly known as:  LIPITOR TAKE 1 TABLET EVERY MORNING   diazepam 5 MG tablet Commonly known as:  VALIUM Take 5 mg by mouth at bedtime as needed for sedation.     fexofenadine 180 MG tablet Commonly known as:  ALLEGRA Take 180 mg by mouth daily.   fluvoxaMINE 50 MG tablet Commonly known as:  LUVOX Take 200 mg by mouth every morning.   furosemide 20 MG tablet Commonly known as:  LASIX Take 2 tablets (40mg ) by mouth 03/30/17 then 1 tablet (20mg ) daily starting the day after.   gemfibrozil 600 MG tablet Commonly known as:  LOPID Take 600 mg by mouth 2 (two) times daily.   latanoprost 0.005 % ophthalmic solution Commonly known as:  XALATAN Place 1 drop into both eyes at bedtime.   levothyroxine 175 MCG tablet Commonly known as:  SYNTHROID, LEVOTHROID Take 175 mcg by mouth daily before breakfast.   lisinopril 10 MG tablet Commonly known as:  PRINIVIL,ZESTRIL TAKE 1 TABLET EVERY MORNING   nitroGLYCERIN 0.4 MG SL tablet Commonly known as:  NITROSTAT PLACE 1 TABLET (0.4 MG TOTAL) UNDER THE TONGUE EVERY 5 (FIVE) MINUTES AS NEEDED FOR CHEST PAIN.   pantoprazole 40 MG tablet Commonly known as:  PROTONIX Take 2 tablets (80 mg total) by mouth daily. What changed:  when to take this   Vitamin D3 2000 units Tabs Take 2,000 Units by mouth daily.   zolpidem 10 MG tablet Commonly known as:  AMBIEN Take 10 mg by mouth at bedtime as needed for sleep.        Allergies:  Allergies  Allergen Reactions  . Niacin And Related Other (See Comments)    Flushing   . Tagamet [Cimetidine] Other (See Comments)    unknown     Outstanding Labs/Studies   Will arrange OP bmet in f/u  Duration of Discharge Encounter   Greater than 30 minutes including physician time.  Signed, Charlie Pitter PA-C 03/29/2017, 3:57 PM

## 2017-03-29 NOTE — Progress Notes (Signed)
[place] for Heparin Indication: chest pain/ACS  Allergies  Allergen Reactions  . Niacin And Related Other (See Comments)    Flushing   . Tagamet [Cimetidine] Other (See Comments)    unknown    Patient Measurements: Height: 5\' 11"  (180.3 cm) Weight: 269 lb (122 kg) IBW/kg (Calculated) : 75.3 Heparin Dosing Weight: 102.1 kg  Vital Signs: Temp: 97.6 F (36.4 C) (10/30 0400) Temp Source: Oral (10/30 0400) BP: 109/65 (10/30 0400) Pulse Rate: 39 (10/30 0400)  Labs:  Recent Labs  03/28/17 1748 03/29/17 0014 03/29/17 0554  HGB 13.2  --  12.8*  HCT 39.8  --  39.2  PLT 217  --  199  LABPROT 13.6  --   --   INR 1.05  --   --   HEPARINUNFRC  --   --  0.52  CREATININE 1.06  --   --   TROPONINI <0.03 <0.03  --     Estimated Creatinine Clearance: 92.4 mL/min (by C-G formula based on SCr of 1.06 mg/dL).  Assessment: 66 y.o. male with chest pain for heparin  Goal of Therapy:  Heparin level 0.3-0.7 units/ml Monitor platelets by anticoagulation protocol: Yes   Plan:  Continue Heparin at current rate Follow up after cath today   Damyiah Moxley, Bronson Curb 03/29/2017,6:47 AM

## 2017-03-29 NOTE — Care Management CC44 (Signed)
Condition Code 44 Documentation Completed  Patient Details  Name: Lateef Juncaj MRN: [id number] Date of Birth: [birth year]   Condition Code 44 given:  Yes Patient signature on Condition Code 44 notice:  Yes Documentation of 2 MD's agreement:  Yes Code 44 added to claim:  Yes    Bethena Roys, RN 03/29/2017, 4:55 PM

## 2017-03-29 NOTE — Interval H&P Note (Signed)
History and Physical Interval Note:  03/29/2017 1:37 PM  Blake Carter  has presented today for surgery, with the diagnosis of cp, sob --progressive angina.  The various methods of treatment have been discussed with the patient and family. After consideration of risks, benefits and other options for treatment, the patient has consented to  Procedure(s): LEFT HEART CATH AND CORONARY ANGIOGRAPHY (N/A) with possible PERCUTANEOUS CORONARY INTERVENTION as a surgical intervention .  The patient's history has been reviewed, patient examined, no change in status, stable for surgery.  I have reviewed the patient's chart and labs.  Questions were answered to the patient's satisfaction.     Cath Lab Visit (complete for each Cath Lab visit)  Clinical Evaluation Leading to the Procedure:   ACS: Yes.    Non-ACS:    Anginal Classification: CCS III  Anti-ischemic medical therapy: Minimal Therapy (1 class of medications)  Non-Invasive Test Results: No non-invasive testing performed  Prior CABG: No previous CABG    Glenetta Hew

## 2017-03-29 NOTE — Progress Notes (Signed)
Progress Note  Patient Name: Blake Carter Date of Encounter: 03/29/2017  Primary Cardiologist: Dr. Radford Pax  Subjective   Denies any further CP  Inpatient Medications    Scheduled Meds: . aspirin  324 mg Oral NOW   Or  . aspirin  300 mg Rectal NOW  . [START ON 03/30/2017] aspirin EC  81 mg Oral Daily  . atorvastatin  40 mg Oral q1800  . sodium chloride flush  3 mL Intravenous Q12H   Continuous Infusions: . sodium chloride    . sodium chloride    . sodium chloride 1 mL/kg/hr (03/29/17 0800)  . heparin 1,450 Units/hr (03/28/17 1911)  . nitroGLYCERIN 5 mcg/min (03/28/17 1918)   PRN Meds: sodium chloride, acetaminophen, ALPRAZolam, nitroGLYCERIN, ondansetron (ZOFRAN) IV, sodium chloride flush, zolpidem   Vital Signs    Vitals:   03/28/17 1726 03/28/17 2109 03/29/17 0400 03/29/17 0837  BP: (!) 166/84 122/77 109/65 104/64  Pulse: (!) 54 (!) 45 (!) 39 (!) 44  Resp: 18 20 18 20   Temp: 98.9 F (37.2 C) 97.9 F (36.6 C) 97.6 F (36.4 C) 97.7 F (36.5 C)  TempSrc: Oral Oral Oral Oral  SpO2: 98% 98% 96% 98%  Weight: 267 lb 4.8 oz (121.2 kg)  269 lb (122 kg)   Height: 5\' 11"  (1.803 m)       Intake/Output Summary (Last 24 hours) at 03/29/17 0903 Last data filed at 03/29/17 0300  Gross per 24 hour  Intake           605.34 ml  Output                0 ml  Net           605.34 ml   Filed Weights   03/28/17 1726 03/29/17 0400  Weight: 267 lb 4.8 oz (121.2 kg) 269 lb (122 kg)    Telemetry    NSR - Personally Reviewed  ECG    No new EKG to review - Personally Reviewed  Physical Exam   GEN: No acute distress.   Neck: No JVD Cardiac: RRR, no murmurs, rubs, or gallops.  Respiratory: Clear to auscultation bilaterally. GI: Soft, nontender, non-distended  MS: No edema; No deformity. Neuro:  Nonfocal  Psych: Normal affect   Labs    Chemistry Recent Labs Lab 03/28/17 1748 03/29/17 0554  NA 138 139  K 3.6 3.8  CL 105 106  CO2 22 23  GLUCOSE 109* 106*   BUN 14 14  CREATININE 1.06 0.98  CALCIUM 8.7* 8.6*  PROT 6.8  --   ALBUMIN 4.0  --   AST 27  --   ALT 29  --   ALKPHOS 61  --   BILITOT 0.7  --   GFRNONAA >60 >60  GFRAA >60 >60  ANIONGAP 11 10     Hematology Recent Labs Lab 03/28/17 1748 03/29/17 0554  WBC 6.5 7.0  RBC 4.49 4.41  HGB 13.2 12.8*  HCT 39.8 39.2  MCV 88.6 88.9  MCH 29.4 29.0  MCHC 33.2 32.7  RDW 13.8 13.7  PLT 217 199    Cardiac Enzymes Recent Labs Lab 03/28/17 1748 03/29/17 0014 03/29/17 0554  TROPONINI <0.03 <0.03 <0.03   No results for input(s): TROPIPOC in the last 168 hours.   BNPNo results for input(s): BNP, PROBNP in the last 168 hours.   DDimer No results for input(s): DDIMER in the last 168 hours.   Radiology    No results found.  Cardiac Studies  2D echo 10/2016 Study Conclusions  - Left ventricle: The cavity size was normal. There was moderate   concentric hypertrophy. Systolic function was normal. The   estimated ejection fraction was in the range of 60% to 65%. Wall   motion was normal; there were no regional wall motion   abnormalities. There was an increased relative contribution of   atrial contraction to ventricular filling. Doppler parameters are   consistent with abnormal left ventricular relaxation (grade 1   diastolic dysfunction). - Aortic valve: Trileaflet; normal thickness, mildly calcified   leaflets. There was trivial regurgitation. - Aorta: Aortic root dimension: 40 mm (ED). Ascending aortic   diameter: 42 mm (S). - Aortic root: The aortic root was mildly dilated. - Ascending aorta: The ascending aorta was mildly dilated. - Mitral valve: Calcified annulus. There was trivial regurgitation. - Left atrium: The atrium was mildly dilated. - Right ventricle: The cavity size was mildly dilated. Wall   thickness was normal.  Impressions:  - Normal LVF with mildly dilated aortic root and ascending aorta   which is new compared to prior study.   Patient  Profile     66 y.o. male with a history of ASCAD s/p PCI of the mid LAD and distal LCx remotely, HTN and dyslipidemia, admitted with USAP.    Assessment & Plan   1.  USAP - he has had crescendo angina over last month with known CAD - Trop negative x3 - continue ASA, IV Heparin gtt, IV NTG gtt and statin - no BB due to bradycardia - he is NPO - plan left heart cath today with possible PCI  2. CAD with 2 vessel ASCAD in 2016with 60-70% mid LAD with positive FFR and 90% distal left circ s/p DES to the LAD and distal LCX. - continue ASA and statin.   3. Asymptomatic bradycardia at times to 32 but no syncope.  He is on no rate lowering meds.  4. HLD continue statin.  - LDL at goal at 57  5. Hx prostate cancer.  6. Hx dilated aortic root. - 76mm of echo 10/2015.   For questions or updates, please contact [place] Please consult [email or web address] for contact info under Cardiology/STEMI.      Signed, Fransico Him, MD  03/29/2017, 9:03 AM

## 2017-03-29 NOTE — Progress Notes (Signed)
Patient received discharge instructions. TR band removed, level 0. No complications, +2 right radial pulse, warm, and dry. Provided post-cath education on radial site. Started on lasix tablet form. Gave first dose today and potassium. No immediate side effects. Patient ambulated in room without any issues. Educated on lasix medication. Patient verbalized understanding. Removed bilateral IV's. Escorted out by nurse tech via wheelchair.

## 2017-03-29 NOTE — H&P (View-Only) (Signed)
Progress Note  Patient Name: Blake Carter Date of Encounter: 03/29/2017  Primary Cardiologist: Dr. Radford Pax  Subjective   Denies any further CP  Inpatient Medications    Scheduled Meds: . aspirin  324 mg Oral NOW   Or  . aspirin  300 mg Rectal NOW  . [START ON 03/30/2017] aspirin EC  81 mg Oral Daily  . atorvastatin  40 mg Oral q1800  . sodium chloride flush  3 mL Intravenous Q12H   Continuous Infusions: . sodium chloride    . sodium chloride    . sodium chloride 1 mL/kg/hr (03/29/17 0800)  . heparin 1,450 Units/hr (03/28/17 1911)  . nitroGLYCERIN 5 mcg/min (03/28/17 1918)   PRN Meds: sodium chloride, acetaminophen, ALPRAZolam, nitroGLYCERIN, ondansetron (ZOFRAN) IV, sodium chloride flush, zolpidem   Vital Signs    Vitals:   03/28/17 1726 03/28/17 2109 03/29/17 0400 03/29/17 0837  BP: (!) 166/84 122/77 109/65 104/64  Pulse: (!) 54 (!) 45 (!) 39 (!) 44  Resp: 18 20 18 20   Temp: 98.9 F (37.2 C) 97.9 F (36.6 C) 97.6 F (36.4 C) 97.7 F (36.5 C)  TempSrc: Oral Oral Oral Oral  SpO2: 98% 98% 96% 98%  Weight: 267 lb 4.8 oz (121.2 kg)  269 lb (122 kg)   Height: 5\' 11"  (1.803 m)       Intake/Output Summary (Last 24 hours) at 03/29/17 0903 Last data filed at 03/29/17 0300  Gross per 24 hour  Intake           605.34 ml  Output                0 ml  Net           605.34 ml   Filed Weights   03/28/17 1726 03/29/17 0400  Weight: 267 lb 4.8 oz (121.2 kg) 269 lb (122 kg)    Telemetry    NSR - Personally Reviewed  ECG    No new EKG to review - Personally Reviewed  Physical Exam   GEN: No acute distress.   Neck: No JVD Cardiac: RRR, no murmurs, rubs, or gallops.  Respiratory: Clear to auscultation bilaterally. GI: Soft, nontender, non-distended  MS: No edema; No deformity. Neuro:  Nonfocal  Psych: Normal affect   Labs    Chemistry Recent Labs Lab 03/28/17 1748 03/29/17 0554  NA 138 139  K 3.6 3.8  CL 105 106  CO2 22 23  GLUCOSE 109* 106*   BUN 14 14  CREATININE 1.06 0.98  CALCIUM 8.7* 8.6*  PROT 6.8  --   ALBUMIN 4.0  --   AST 27  --   ALT 29  --   ALKPHOS 61  --   BILITOT 0.7  --   GFRNONAA >60 >60  GFRAA >60 >60  ANIONGAP 11 10     Hematology Recent Labs Lab 03/28/17 1748 03/29/17 0554  WBC 6.5 7.0  RBC 4.49 4.41  HGB 13.2 12.8*  HCT 39.8 39.2  MCV 88.6 88.9  MCH 29.4 29.0  MCHC 33.2 32.7  RDW 13.8 13.7  PLT 217 199    Cardiac Enzymes Recent Labs Lab 03/28/17 1748 03/29/17 0014 03/29/17 0554  TROPONINI <0.03 <0.03 <0.03   No results for input(s): TROPIPOC in the last 168 hours.   BNPNo results for input(s): BNP, PROBNP in the last 168 hours.   DDimer No results for input(s): DDIMER in the last 168 hours.   Radiology    No results found.  Cardiac Studies  2D echo 10/2016 Study Conclusions  - Left ventricle: The cavity size was normal. There was moderate   concentric hypertrophy. Systolic function was normal. The   estimated ejection fraction was in the range of 60% to 65%. Wall   motion was normal; there were no regional wall motion   abnormalities. There was an increased relative contribution of   atrial contraction to ventricular filling. Doppler parameters are   consistent with abnormal left ventricular relaxation (grade 1   diastolic dysfunction). - Aortic valve: Trileaflet; normal thickness, mildly calcified   leaflets. There was trivial regurgitation. - Aorta: Aortic root dimension: 40 mm (ED). Ascending aortic   diameter: 42 mm (S). - Aortic root: The aortic root was mildly dilated. - Ascending aorta: The ascending aorta was mildly dilated. - Mitral valve: Calcified annulus. There was trivial regurgitation. - Left atrium: The atrium was mildly dilated. - Right ventricle: The cavity size was mildly dilated. Wall   thickness was normal.  Impressions:  - Normal LVF with mildly dilated aortic root and ascending aorta   which is new compared to prior study.   Patient  Profile     66 y.o. male with a history of ASCAD s/p PCI of the mid LAD and distal LCx remotely, HTN and dyslipidemia, admitted with USAP.    Assessment & Plan   1.  USAP - he has had crescendo angina over last month with known CAD - Trop negative x3 - continue ASA, IV Heparin gtt, IV NTG gtt and statin - no BB due to bradycardia - he is NPO - plan left heart cath today with possible PCI  2. CAD with 2 vessel ASCAD in 2016with 60-70% mid LAD with positive FFR and 90% distal left circ s/p DES to the LAD and distal LCX. - continue ASA and statin.   3. Asymptomatic bradycardia at times to 32 but no syncope.  He is on no rate lowering meds.  4. HLD continue statin.  - LDL at goal at 57  5. Hx prostate cancer.  6. Hx dilated aortic root. - 56mm of echo 10/2015.   For questions or updates, please contact [place] Please consult [email or web address] for contact info under Cardiology/STEMI.      Signed, Fransico Him, MD  03/29/2017, 9:03 AM

## 2017-03-29 NOTE — Care Management Obs Status (Signed)
[place] NOTIFICATION   Patient Details  Name: Blake Carter MRN: [id number] Date of Birth: 1950-10-11   Medicare Observation Status Notification Given:  Yes    Bethena Roys, RN 03/29/2017, 4:55 PM

## 2017-03-30 ENCOUNTER — Encounter (HOSPITAL_COMMUNITY): Payer: Self-pay | Admitting: Cardiology

## 2017-03-30 ENCOUNTER — Telehealth (HOSPITAL_COMMUNITY): Payer: Self-pay | Admitting: *Deleted

## 2017-03-30 ENCOUNTER — Telehealth: Payer: Self-pay | Admitting: Physician Assistant

## 2017-03-30 MED FILL — Lidocaine HCl Local Inj 2%: INTRAMUSCULAR | Qty: 20 | Status: AC

## 2017-03-30 NOTE — Telephone Encounter (Signed)
-----   Message from Charlie Pitter, [place] sent at 03/30/2017  3:36 PM EDT ----- Regarding: RE: Ok to return to maintenance Please let him know he is OK to return to cardiac rehab on Friday as long as he is feeling well - no  lifting over 5 lbs for 1 week and avoid activities that include pushing with his radial site arm. Thanks!  ----- Message ----- From: Rowe Pavy, RN Sent: 03/30/2017   2:24 PM To: Charlie Pitter, PA-C Subject: Ok to return to maintenance                    [name]  The above patient of [place], discharged yesterday with brief hospital stay.  Pt had clean cath.  Pt returned today for cardiac rehab maintenance (non telemetry).  Discharge summary does not mention ok to return to exercise and when.  Pt cath site was radial approach.  When may pt return to exercise?  Any limitations or restrictions?  Thanks Psychologist, clinical, BSN Cardiac and Training and development officer

## 2017-03-30 NOTE — Telephone Encounter (Signed)
Patient made aware of recommendations below. He verbalizes understanding and states that he plans to keep his appointment on 11/6 at 11:30 AM.

## 2017-03-30 NOTE — Telephone Encounter (Signed)
Received staff message from cardiac rehab about pt returning to exercise/maintenance program. I sent message back to CR nurse to let him know but given later nature of day, I wasn't sure if he will get the message. Please call Blake Carter and let him know he is OK to return to cardiac rehab on Friday as long as he is feeling well - no lifting over 5 lbs for 1 week and avoid activities that include pushing with his radial site arm. Please also remind him to keep procedure site clean & dry. If he notices any increased pain, swelling, bleeding or pus, call/return! He can shower, but no soaking baths/hot tubs/pools for 1 week. We had previously reviewed these instructions but please reiterate as he gets back into normal activity. Keep f/u as planned. [name] Dunn PA-C

## 2017-03-31 ENCOUNTER — Encounter (HOSPITAL_COMMUNITY): Payer: Self-pay

## 2017-03-31 DIAGNOSIS — I2510 Atherosclerotic heart disease of native coronary artery without angina pectoris: Secondary | ICD-10-CM | POA: Insufficient documentation

## 2017-04-01 ENCOUNTER — Encounter (HOSPITAL_COMMUNITY): Payer: Self-pay

## 2017-04-04 ENCOUNTER — Encounter (HOSPITAL_COMMUNITY)
Admission: RE | Admit: 2017-04-04 | Discharge: 2017-04-04 | Disposition: A | Discharge status: Home / Self Care | Payer: Self-pay | Source: Ambulatory Visit | Attending: Cardiology | Admitting: Cardiology

## 2017-04-04 ENCOUNTER — Encounter: Payer: Self-pay | Admitting: Physician Assistant

## 2017-04-04 NOTE — Progress Notes (Signed)
Cardiology Office Note    Date:  04/05/2017  ID:  Blake Carter, DOB April 16, 1951, MRN [id number] PCP:  Hulan Fess, MD  Cardiologist:  Dr. Radford Pax   Chief Complaint: f/u cath  History of Present Illness:  Blake Carter (va-va-litis) is a 66 y.o. male with history of CAD (DES to LCx 05/2014 with residual LAD disease), HTN, dyslipidemia, prostate CA, sinus bradycardia, dilated aortic root, GERD, hiatal hernia, hypothyroidism s/p RAI, OCD, recent probable acute diastolic CHF who presents for f/u cath.  Last echo 10/2016 showed EF 60-6%, grade 1 DD, aortic root dilation to 49mm and ascending aortic diameter to 50mm, mildly dilated RV. Prior notes indicate known bradycardia with HR in upper 30s-50s in the past, but relatively asymptomatic so this has been managed conservatively. 24 hour Holter 10/2016 showed Sinus bradycardia, sinus arrhythmia and normal sinus rhythm with average heart rate 50bpm and heart rate ranged from 32 to 100bpm. He has been on ASA but Plavix was discontinued in 03/2016 after he completed course. He was recently admitted 02/2017 with chest discomfort and dyspnea on exertion. This was in the setting of recently having had 6 hot dogs at the race in [place] just days prior. Troponins were negative.  Cardiac cath demonstrated widely patent stents, otherwise mild disease, no lesion to explain symptoms. LVEDP was mild to moderately elevated, suggestive of diastolic CHF. Dr. Radford Pax recommended to start Lasix 40mg  daily x 2 days then reduce to 20mg  daily. She also recommended OP PFTs. D-dimer was negative. Labs otherwise showed LDL 57, Hgb 12.8 (appears chronic), K 3.8, Cr 0.98, normal TSH and fT4.  He returns for follow-up today overall feeling well. No further chest discomfort or dyspnea. Weight is down 10lb compared to last office visit. He's scaled back his fluid intake significantly and is more cognizant of the sodium in his diet. He reports blood pressures are running softer  since Lasix initiation. I'm unable to pull it up but he reports his blood pressure was 272 systolic yesterday at rehab. This used to run 120-130 but he was also drinking a lot more fluid as well. Has a cruise coming up and seems committed to continuing heart healthy diet at sea.   Past Medical History:  Diagnosis Date  . Allergic rhinitis   . Arthritis    "a little bit in some of my joints" (06/20/2014)  . Chronic diastolic CHF (congestive heart failure) ([place])   . Coronary artery disease    a. 05/2014 Cath/PCI: LM nl, LAD 40p, 60-70d (FFR 0.80->3.0x24 Synergy DES), D2 40, LCX 90d (2.25x12 Synergy DES), RCA 20-49m, EF 60-65%. b. Cath 02/2017 without acute change, elev LVEDP suggestive of dCHF.  Marland Kitchen Depression   . Dilated aortic root (HCC)    aortic root 71mm and ascending aorta 44mm by echo 10/2016  . ED (erectile dysfunction)   . Environmental allergies    dry cough  . GERD (gastroesophageal reflux disease)   . History of hiatal hernia   . Hydronephrosis of right kidney    a. s/p ureteral stenting in the past.  . Hyperlipidemia    under control  . Hypertension   . Hypothyroidism   . OCD (obsessive compulsive disorder)   . Panic disorder   . PONV (postoperative nausea and vomiting)    after thyroid surgery no problems in 10 years   . Prostate cancer [facility])    prostate cancer- robotic prostatectomy - followed by radiation because of positive lymph node--and pt on lupron injections  . Sinus bradycardia  a. chronic, asymptomatic.  24 hour Holter 10/2016 showed Sinus bradycardia, sinus arrhythmia and normal sinus rhythm with average heart rate 50bpm and heart rate ranged from 32 to 100bpm.  . SUI (stress urinary incontinence), male    S/P ROBOTIC PROSTATECTOMY AND RADIATION TX    Past Surgical History:  Procedure Laterality Date  . COLONOSCOPY  07/2013   Dr. Oletta Lamas  . CORONARY ANGIOPLASTY WITH STENT PLACEMENT  06/20/2014   "2"  . REFRACTIVE SURGERY Bilateral 2004  . ROBOT ASSISTED  LAPAROSCOPIC RADICAL PROSTATECTOMY  05/2010  . THYROIDECTOMY, PARTIAL  10/1974  . TONSILLECTOMY  1956   . TOTAL THYROIDECTOMY  10/2012   "nuked it"  . UPPER GI ENDOSCOPY     food impaction 2009 done by Dr Oletta Lamas    Current Medications: Current Meds  Medication Sig  . aspirin EC 81 MG tablet Take 81 mg by mouth daily.  Marland Kitchen atorvastatin (LIPITOR) 40 MG tablet TAKE 1 TABLET EVERY MORNING  . Cholecalciferol (VITAMIN D3) 2000 units TABS Take 2,000 Units by mouth daily.  . diazepam (VALIUM) 5 MG tablet Take 5 mg by mouth at bedtime as needed for sedation.  . fexofenadine (ALLEGRA) 180 MG tablet Take 180 mg by mouth daily.  . fluvoxaMINE (LUVOX) 50 MG tablet Take 200 mg by mouth every morning.   . furosemide (LASIX) 20 MG tablet Take 2 tablets (40mg ) by mouth 03/30/17 then 1 tablet (20mg ) daily starting the day after.  Marland Kitchen gemfibrozil (LOPID) 600 MG tablet Take 600 mg by mouth 2 (two) times daily.   Marland Kitchen latanoprost (XALATAN) 0.005 % ophthalmic solution Place 1 drop into both eyes at bedtime.   Marland Kitchen levothyroxine (SYNTHROID, LEVOTHROID) 175 MCG tablet Take 175 mcg by mouth daily before breakfast.  . lisinopril (PRINIVIL,ZESTRIL) 10 MG tablet TAKE 1 TABLET EVERY MORNING  . nitroGLYCERIN (NITROSTAT) 0.4 MG SL tablet PLACE 1 TABLET (0.4 MG TOTAL) UNDER THE TONGUE EVERY 5 (FIVE) MINUTES AS NEEDED FOR CHEST PAIN.  Marland Kitchen pantoprazole (PROTONIX) 40 MG tablet Take 2 tablets (80 mg total) by mouth daily. (Patient taking differently: Take 80 mg by mouth at bedtime. )  . zolpidem (AMBIEN) 10 MG tablet Take 10 mg by mouth at bedtime as needed for sleep.      Allergies:   Niacin and related and Tagamet [cimetidine]   Social History   Socioeconomic History  . Marital status: Married    Spouse name: None  . Number of children: None  . Years of education: None  . Highest education level: None  Social Needs  . Financial resource strain: None  . Food insecurity - worry: None  . Food insecurity - inability: None  .  Transportation needs - medical: None  . Transportation needs - non-medical: None  Occupational History  . None  Tobacco Use  . Smoking status: Never Smoker  . Smokeless tobacco: Never Used  Substance and Sexual Activity  . Alcohol use: Yes    Alcohol/week: 0.0 oz    Comment: 06/20/2014 "1-2 drinks a couple times/month"  . Drug use: No  . Sexual activity: Yes  Other Topics Concern  . None  Social History Narrative  . None     Family History:  Family History  Problem Relation Age of Onset  . Cancer Father   . Hyperlipidemia Father   . Heart disease Father        Father had CABG/aorta repair by the time he was in his 48s  . CAD Brother  Brother who is 36 years younger has had some sort of stenting    ROS:   Please see the history of present illness.  All other systems are reviewed and otherwise negative.    PHYSICAL EXAM:   VS:  BP (!) 94/52   Pulse (!) 55   Resp 16   Ht 5\' 11"  (1.803 m)   Wt 263 lb (119.3 kg)   SpO2 97%   BMI 36.68 kg/m   BMI: Body mass index is 36.68 kg/m. GEN: Well nourished, well developed obese WM, in no acute distress  HEENT: normocephalic, atraumatic Neck: no JVD, carotid bruits, or masses Cardiac:RRR; no murmurs, rubs, or gallops, no edema  Respiratory:  clear to auscultation bilaterally, normal work of breathing GI: soft, nontender, nondistended, + BS MS: no deformity or atrophy  Skin: warm and dry, no rash, right radial cath site without hematoma or ecchymosis; good pulse. Neuro:  Alert and Oriented x 3, Strength and sensation are intact, follows commands Psych: euthymic mood, full affect  Wt Readings from Last 3 Encounters:  04/05/17 263 lb (119.3 kg)  03/29/17 269 lb (122 kg)  03/28/17 273 lb (123.8 kg)      Studies/Labs Reviewed:   EKG:  EKG was not ordered today  Recent Labs: 03/28/2017: ALT 29; Magnesium 1.7; TSH 1.042 03/29/2017: BUN 14; Creatinine, Ser 0.98; Hemoglobin 12.8; Platelets 199; Potassium 3.8; Sodium  139   Lipid Panel    Component Value Date/Time   CHOL 124 03/29/2017 0554   CHOL 140 10/11/2016 0853   TRIG 169 (H) 03/29/2017 0554   HDL 33 (L) 03/29/2017 0554   HDL 39 (L) 10/11/2016 0853   CHOLHDL 3.8 03/29/2017 0554   VLDL 34 03/29/2017 0554   LDLCALC 57 03/29/2017 0554   LDLCALC 72 10/11/2016 0853    Additional studies/ records that were reviewed today include: Summarized above.    ASSESSMENT & PLAN:   1. Chronic diastolic CHF - appears euvolemic. Weight is down 10 lbs. His blood pressure is actually on the softer side following Lasix administration, but he is asymptomatic. It's not clear that he really needs maintenance Lasix at this point so long as he remains within fluid/sodium constraints. Most recent episode was precipitated by salt load and also drinking lots of water at that time. Will check BMET/CBC given hypotension. Will have him decrease Lasix to 20mg  daily as needed for weight gain of 3lb or higher. Reviewed 2g sodium restriction, 2L fluid restriction, daily weights with patient. I'm hopeful the cruise won't offer any setbacks. I told him if his blood pressure remains on the lower side when he checks it/at cardiac rehab, to give [place] a call at which time we might need to reduce lisinopril dose down. 2. CAD - recent cath as above. Continue ASA, statin. Not on BB due to #3. 3. Asymptomatic bradycardia - remains asymptomatic. Follow. 4. Hyperlipidemia - recent LDL within goal. Continue statin. Further monitoring per Dr. Radford Pax. 5. Hx of dilated aortic root - further monitoring per primary cardiologist; will likely need reassessment closer to 10/2017.  Disposition: F/u with Dr. Radford Pax as scheduled 05/2017.  Medication Adjustments/Labs and Tests Ordered: Current medicines are reviewed at length with the patient today.  Concerns regarding medicines are outlined above. Medication changes, Labs and Tests ordered today are summarized above and listed in the Patient Instructions  accessible in Encounters.   Signed, Charlie Pitter, PA-C  04/05/2017 11:58 AM    [address] [place],  , [place]  [address] Phone: [phone]; Fax: [phone]  

## 2017-04-05 ENCOUNTER — Encounter: Payer: Self-pay | Admitting: Physician Assistant

## 2017-04-05 ENCOUNTER — Encounter (HOSPITAL_COMMUNITY): Payer: Self-pay

## 2017-04-05 ENCOUNTER — Ambulatory Visit (INDEPENDENT_AMBULATORY_CARE_PROVIDER_SITE_OTHER): Payer: BC Managed Care – PPO | Admitting: Physician Assistant

## 2017-04-05 VITALS — BP 94/52 | HR 55 | Resp 16 | Ht 71.0 in | Wt 263.0 lb

## 2017-04-05 DIAGNOSIS — E7800 Pure hypercholesterolemia, unspecified: Secondary | ICD-10-CM

## 2017-04-05 DIAGNOSIS — I5032 Chronic diastolic (congestive) heart failure: Secondary | ICD-10-CM | POA: Diagnosis not present

## 2017-04-05 DIAGNOSIS — R001 Bradycardia, unspecified: Secondary | ICD-10-CM | POA: Diagnosis not present

## 2017-04-05 DIAGNOSIS — I2510 Atherosclerotic heart disease of native coronary artery without angina pectoris: Secondary | ICD-10-CM | POA: Diagnosis not present

## 2017-04-05 DIAGNOSIS — I77810 Thoracic aortic ectasia: Secondary | ICD-10-CM | POA: Diagnosis not present

## 2017-04-05 LAB — BASIC METABOLIC PANEL
BUN/Creatinine Ratio: 16 (ref 10–24)
BUN: 18 mg/dL (ref 8–27)
CO2: 24 mmol/L (ref 20–29)
Calcium: 9.6 mg/dL (ref 8.6–10.2)
Chloride: 98 mmol/L (ref 96–106)
Creatinine, Ser: 1.15 mg/dL (ref 0.76–1.27)
GFR calc Af Amer: 77 mL/min/{1.73_m2} (ref >59)
GFR calc non Af Amer: 66 mL/min/{1.73_m2} (ref >59)
Glucose: 93 mg/dL (ref 65–99)
Potassium: 5.1 mmol/L (ref 3.5–5.2)
Sodium: 137 mmol/L (ref 134–144)

## 2017-04-05 LAB — CBC
Hematocrit: 41.3 % (ref 37.5–51.0)
Hemoglobin: 13.9 g/dL (ref 13.0–17.7)
MCH: 29.8 pg (ref 26.6–33.0)
MCHC: 33.7 g/dL (ref 31.5–35.7)
MCV: 88 fL (ref 79–97)
Platelets: 255 10*3/uL (ref 150–379)
RBC: 4.67 x10E6/uL (ref 4.14–5.80)
RDW: 13.8 % (ref 12.3–15.4)
WBC: 5.8 10*3/uL (ref 3.4–10.8)

## 2017-04-05 MED ORDER — FUROSEMIDE 20 MG PO TABS: 20.0000 mg | ORAL_TABLET | ORAL | 6 refills | Status: DC | PRN

## 2017-04-05 NOTE — Patient Instructions (Signed)
Medication Instructions:  Your physician has recommended you make the following change in your medication  CHANGE: take lasix 20 mg daily as needed for weight gain of 3lbs or more.   Labwork: Today for basic metabolic panel and complete blood count   Testing/Procedures: None ordered  Follow-Up: Your physician recommends that you keep your follow up appointment with Dr. Radford Pax on 06/08/17 at 8:00AM  Any Other Special Instructions Will Be Listed Below (If Applicable).  Call office if blood pressure continues to run low.    If you need a refill on your cardiac medications before your next appointment, please call your pharmacy.

## 2017-04-06 ENCOUNTER — Encounter (HOSPITAL_COMMUNITY)
Admission: RE | Admit: 2017-04-06 | Discharge: 2017-04-06 | Disposition: A | Discharge status: Home / Self Care | Payer: Self-pay | Source: Ambulatory Visit | Attending: Cardiology | Admitting: Cardiology

## 2017-04-07 ENCOUNTER — Encounter: Payer: Self-pay | Admitting: Cardiology

## 2017-04-07 ENCOUNTER — Encounter (HOSPITAL_COMMUNITY): Payer: Self-pay

## 2017-04-08 ENCOUNTER — Encounter (HOSPITAL_COMMUNITY)
Admission: RE | Admit: 2017-04-08 | Discharge: 2017-04-08 | Disposition: A | Discharge status: Home / Self Care | Payer: BC Managed Care – PPO | Source: Ambulatory Visit | Attending: Urology | Admitting: Urology

## 2017-04-08 ENCOUNTER — Encounter (HOSPITAL_COMMUNITY): Payer: Self-pay

## 2017-04-08 DIAGNOSIS — C61 Malignant neoplasm of prostate: Secondary | ICD-10-CM | POA: Diagnosis not present

## 2017-04-08 MED ORDER — TECHNETIUM TC 99M MEDRONATE IV KIT
22.0000 | PACK | Freq: Once | INTRAVENOUS | Status: AC | PRN
  Administered 2017-04-08: 22 via INTRAVENOUS

## 2017-04-12 ENCOUNTER — Encounter: Payer: Self-pay | Admitting: Cardiology

## 2017-04-12 ENCOUNTER — Encounter (HOSPITAL_COMMUNITY): Payer: Self-pay

## 2017-04-14 ENCOUNTER — Encounter (HOSPITAL_COMMUNITY): Payer: Self-pay

## 2017-04-15 ENCOUNTER — Encounter (HOSPITAL_COMMUNITY): Payer: Self-pay

## 2017-04-18 ENCOUNTER — Encounter (HOSPITAL_COMMUNITY)
Admission: RE | Admit: 2017-04-18 | Discharge: 2017-04-18 | Disposition: A | Discharge status: Home / Self Care | Payer: Self-pay | Source: Ambulatory Visit | Attending: Cardiology | Admitting: Cardiology

## 2017-04-19 ENCOUNTER — Encounter (HOSPITAL_COMMUNITY): Payer: Self-pay

## 2017-04-25 ENCOUNTER — Encounter (HOSPITAL_COMMUNITY)
Admission: RE | Admit: 2017-04-25 | Discharge: 2017-04-25 | Disposition: A | Discharge status: Home / Self Care | Payer: Self-pay | Source: Ambulatory Visit | Attending: Cardiology | Admitting: Cardiology

## 2017-04-26 ENCOUNTER — Encounter (HOSPITAL_COMMUNITY): Payer: Self-pay

## 2017-04-28 ENCOUNTER — Encounter (HOSPITAL_COMMUNITY): Payer: Self-pay

## 2017-04-29 ENCOUNTER — Encounter (HOSPITAL_COMMUNITY): Payer: Self-pay

## 2017-05-03 ENCOUNTER — Encounter (HOSPITAL_COMMUNITY): Payer: Self-pay

## 2017-05-03 DIAGNOSIS — I2510 Atherosclerotic heart disease of native coronary artery without angina pectoris: Secondary | ICD-10-CM | POA: Insufficient documentation

## 2017-05-05 ENCOUNTER — Encounter (HOSPITAL_COMMUNITY)
Admission: RE | Admit: 2017-05-05 | Discharge: 2017-05-05 | Disposition: A | Discharge status: Home / Self Care | Payer: Self-pay | Source: Ambulatory Visit | Attending: Cardiology | Admitting: Cardiology

## 2017-05-06 ENCOUNTER — Encounter (HOSPITAL_COMMUNITY)
Admission: RE | Admit: 2017-05-06 | Discharge: 2017-05-06 | Disposition: A | Discharge status: Home / Self Care | Payer: Self-pay | Source: Ambulatory Visit | Attending: Cardiology | Admitting: Cardiology

## 2017-05-10 ENCOUNTER — Encounter (HOSPITAL_COMMUNITY): Payer: Self-pay

## 2017-05-12 ENCOUNTER — Encounter (HOSPITAL_COMMUNITY): Payer: Self-pay

## 2017-05-13 ENCOUNTER — Encounter (HOSPITAL_COMMUNITY): Payer: Self-pay

## 2017-05-17 ENCOUNTER — Ambulatory Visit (INDEPENDENT_AMBULATORY_CARE_PROVIDER_SITE_OTHER): Payer: BC Managed Care – PPO | Admitting: Internal Medicine

## 2017-05-17 ENCOUNTER — Encounter (HOSPITAL_COMMUNITY): Payer: Self-pay

## 2017-05-17 DIAGNOSIS — R0602 Shortness of breath: Secondary | ICD-10-CM | POA: Diagnosis not present

## 2017-05-17 LAB — PULMONARY FUNCTION TEST
DL/VA % pred: 96 %
DL/VA: 4.49 ml/min/mmHg/L
DLCO cor % pred: 86 %
DLCO cor: 29 ml/min/mmHg
DLCO unc % pred: 83 %
DLCO unc: 28.06 ml/min/mmHg
FEF 25-75 Post: 4.8 L/sec
FEF 25-75 Pre: 4.73 L/sec
FEF2575-%Change-Post: 1 %
FEF2575-%Pred-Post: 170 %
FEF2575-%Pred-Pre: 168 %
FEV1-%Change-Post: 2 %
FEV1-%Pred-Post: 92 %
FEV1-%Pred-Pre: 90 %
FEV1-Post: 3.28 L
FEV1-Pre: 3.21 L
FEV1FVC-%Change-Post: 0 %
FEV1FVC-%Pred-Pre: 117 %
FEV6-%Change-Post: 2 %
FEV6-%Pred-Post: 82 %
FEV6-%Pred-Pre: 80 %
FEV6-Post: 3.75 L
FEV6-Pre: 3.66 L
FEV6FVC-%Pred-Post: 105 %
FEV6FVC-%Pred-Pre: 105 %
FVC-%Change-Post: 2 %
FVC-%Pred-Post: 78 %
FVC-%Pred-Pre: 76 %
FVC-Post: 3.75 L
FVC-Pre: 3.66 L
Post FEV1/FVC ratio: 87 %
Post FEV6/FVC ratio: 100 %
Pre FEV1/FVC ratio: 88 %
Pre FEV6/FVC Ratio: 100 %
RV % pred: 116 %
RV: 2.8 L
TLC % pred: 100 %
TLC: 7.27 L

## 2017-05-17 NOTE — Progress Notes (Signed)
PFT completed today 05/17/17  

## 2017-05-18 ENCOUNTER — Encounter (HOSPITAL_COMMUNITY)
Admission: RE | Admit: 2017-05-18 | Discharge: 2017-05-18 | Disposition: A | Discharge status: Home / Self Care | Payer: Self-pay | Source: Ambulatory Visit | Attending: Cardiology | Admitting: Cardiology

## 2017-05-19 ENCOUNTER — Encounter (HOSPITAL_COMMUNITY): Payer: Self-pay

## 2017-05-20 ENCOUNTER — Encounter (HOSPITAL_COMMUNITY): Payer: Self-pay

## 2017-05-25 ENCOUNTER — Encounter (HOSPITAL_COMMUNITY)
Admission: RE | Admit: 2017-05-25 | Discharge: 2017-05-25 | Disposition: A | Discharge status: Home / Self Care | Payer: Self-pay | Source: Ambulatory Visit | Attending: Cardiology | Admitting: Cardiology

## 2017-05-26 ENCOUNTER — Encounter (HOSPITAL_COMMUNITY): Payer: Self-pay

## 2017-05-27 ENCOUNTER — Encounter (HOSPITAL_COMMUNITY): Payer: Self-pay

## 2017-05-30 ENCOUNTER — Other Ambulatory Visit: Payer: Self-pay | Admitting: Physician Assistant

## 2017-06-02 ENCOUNTER — Encounter (HOSPITAL_COMMUNITY): Payer: Self-pay

## 2017-06-02 DIAGNOSIS — I2510 Atherosclerotic heart disease of native coronary artery without angina pectoris: Secondary | ICD-10-CM | POA: Insufficient documentation

## 2017-06-03 ENCOUNTER — Encounter (HOSPITAL_COMMUNITY): Payer: Self-pay

## 2017-06-07 ENCOUNTER — Encounter (HOSPITAL_COMMUNITY): Payer: Self-pay

## 2017-06-08 ENCOUNTER — Ambulatory Visit: Payer: BC Managed Care – PPO | Admitting: Cardiology

## 2017-06-09 ENCOUNTER — Encounter (HOSPITAL_COMMUNITY): Payer: Self-pay

## 2017-06-10 ENCOUNTER — Encounter (HOSPITAL_COMMUNITY): Payer: Self-pay

## 2017-06-14 ENCOUNTER — Encounter (HOSPITAL_COMMUNITY)
Admission: RE | Admit: 2017-06-14 | Discharge: 2017-06-14 | Disposition: A | Discharge status: Home / Self Care | Payer: Self-pay | Source: Ambulatory Visit | Attending: Cardiology | Admitting: Cardiology

## 2017-06-16 ENCOUNTER — Encounter (HOSPITAL_COMMUNITY): Payer: Self-pay

## 2017-06-17 ENCOUNTER — Encounter (HOSPITAL_COMMUNITY)
Admission: RE | Admit: 2017-06-17 | Discharge: 2017-06-17 | Disposition: A | Discharge status: Home / Self Care | Payer: Self-pay | Source: Ambulatory Visit | Attending: Cardiology | Admitting: Cardiology

## 2017-06-21 ENCOUNTER — Encounter (HOSPITAL_COMMUNITY): Payer: Self-pay

## 2017-06-23 ENCOUNTER — Encounter (HOSPITAL_COMMUNITY): Payer: Self-pay

## 2017-06-24 ENCOUNTER — Encounter (HOSPITAL_COMMUNITY)
Admission: RE | Admit: 2017-06-24 | Discharge: 2017-06-24 | Disposition: A | Discharge status: Home / Self Care | Payer: BC Managed Care – PPO | Source: Ambulatory Visit | Attending: Cardiology | Admitting: Cardiology

## 2017-06-26 DIAGNOSIS — I5032 Chronic diastolic (congestive) heart failure: Secondary | ICD-10-CM | POA: Insufficient documentation

## 2017-06-26 NOTE — Progress Notes (Signed)
Cardiology Office Note:    Date:  06/27/2017   ID:  Blake Carter, DOB 1950-09-30, MRN [id number]  PCP:  Hulan Fess, MD  Cardiologist:  No primary care provider on file.    Referring MD: Hulan Fess, MD   Chief Complaint  Patient presents with  . Coronary Artery Disease  . Hypertension  . Hyperlipidemia    History of Present Illness:    Blake Carter is a 67 y.o. male with a hx of  CAD (DES to LCx 05/2014 with residual LAD disease with repeat cath 02/2017 with patent stents and otherwise mild nonobstructive dz), HTN, dyslipidemia, sinus bradycardia, dilated aortic root (78mm by echo 6.2018) and chronic diastolic CHF.    He is here today for followup and is doing well.  He denies any chest pain or pressure, PND, orthopnea, LE edema, dizziness (except when getting up too fast, palpitations or syncope. He has some residual SOB from recent flu and bronchitis.  He is compliant with his meds and is tolerating meds with no SE.  He is still participating in cardiac rehab.    Past Medical History:  Diagnosis Date  . Allergic rhinitis   . Arthritis    "a little bit in some of my joints" (06/20/2014)  . Chronic diastolic CHF (congestive heart failure) ([place])   . Coronary artery disease    a. 05/2014 Cath/PCI: LM nl, LAD 40p, 60-70d (FFR 0.80->3.0x24 Synergy DES), D2 40, LCX 90d (2.25x12 Synergy DES), RCA 20-1m, EF 60-65%. b. Cath 02/2017 without acute change, elev LVEDP suggestive of dCHF.  Marland Kitchen Depression   . Dilated aortic root (HCC)    aortic root 15mm and ascending aorta 83mm by echo 10/2016  . ED (erectile dysfunction)   . Environmental allergies    dry cough  . GERD (gastroesophageal reflux disease)   . History of hiatal hernia   . Hydronephrosis of right kidney    a. s/p ureteral stenting in the past.  . Hyperlipidemia    under control  . Hypertension   . Hypothyroidism   . OCD (obsessive compulsive disorder)   . Panic disorder   . PONV (postoperative nausea and  vomiting)    after thyroid surgery no problems in 10 years   . Prostate cancer [facility])    prostate cancer- robotic prostatectomy - followed by radiation because of positive lymph node--and pt on lupron injections  . Sinus bradycardia    a. chronic, asymptomatic.  24 hour Holter 10/2016 showed Sinus bradycardia, sinus arrhythmia and normal sinus rhythm with average heart rate 50bpm and heart rate ranged from 32 to 100bpm.  . SUI (stress urinary incontinence), male    S/P ROBOTIC PROSTATECTOMY AND RADIATION TX    Past Surgical History:  Procedure Laterality Date  . COLONOSCOPY  07/2013   Dr. Oletta Lamas  . CORONARY ANGIOPLASTY WITH STENT PLACEMENT  06/20/2014   "2"  . CYSTOSCOPY  11/30/2011   Procedure: CYSTOSCOPY;  Surgeon: Reece Packer, MD;  Location: [facility];  Service: Urology;  Laterality: N/A;  Inplantation of Artificial Sphincter and Cystoscopy  . CYSTOSCOPY WITH RETROGRADE PYELOGRAM, URETEROSCOPY AND STENT PLACEMENT Right 02/11/2014   Procedure: CYSTOSCOPY WITH RETROGRADE PYELOGRAM, URETEROSCOPY ,URETERAL BIOPSY AND STENT PLACEMENT;  Surgeon: Raynelle Bring, MD;  Location: [facility];  Service: Urology;  Laterality: Right;  . CYSTOSCOPY WITH RETROGRADE PYELOGRAM, URETEROSCOPY AND STENT PLACEMENT Right 04/15/2014   Procedure: CYSTOSCOPY WITH RETROGRADE PYELOGRAM, URETEROSCOPY, BIOPSY AND STENT PLACEMENT;  Surgeon: Raynelle Bring, MD;  Location: [facility];  Service: Urology;  Laterality: Right;  . LEFT HEART CATH AND CORONARY ANGIOGRAPHY N/A 03/29/2017   Procedure: LEFT HEART CATH AND CORONARY ANGIOGRAPHY;  Surgeon: Leonie Man, MD;  Location: [place] CV LAB;  Service: Cardiovascular;  Laterality: N/A;  . LEFT HEART CATHETERIZATION WITH CORONARY ANGIOGRAM N/A 06/20/2014   Procedure: LEFT HEART CATHETERIZATION WITH CORONARY ANGIOGRAM;  Surgeon: Leonie Man, MD;  Location: [facility] CATH LAB;  Service: Cardiovascular;  Laterality: N/A;  . PERCUTANEOUS CORONARY STENT INTERVENTION (PCI-S)  06/20/2014    Procedure: PERCUTANEOUS CORONARY STENT INTERVENTION (PCI-S);  Surgeon: Leonie Man, MD;  Location: [facility] CATH LAB;  Service: Cardiovascular;;  LAD and Circumflex  . REFRACTIVE SURGERY Bilateral 2004  . ROBOT ASSISTED LAPAROSCOPIC RADICAL PROSTATECTOMY  05/2010  . THYROIDECTOMY, PARTIAL  10/1974  . TONSILLECTOMY  1956   . TOTAL THYROIDECTOMY  10/2012   "nuked it"  . UPPER GI ENDOSCOPY     food impaction 2009 done by Dr Oletta Lamas  . URINARY SPHINCTER IMPLANT  11/30/2011   Procedure: ARTIFICIAL URINARY SPHINCTER;  Surgeon: Reece Packer, MD;  Location: [facility];  Service: Urology;  Laterality: N/A;    Current Medications: Current Meds  Medication Sig  . aspirin EC 81 MG tablet Take 81 mg by mouth daily.  Marland Kitchen atorvastatin (LIPITOR) 40 MG tablet Take 40 mg by mouth daily.  . benzonatate (TESSALON) 200 MG capsule Take 200 mg by mouth 3 (three) times daily as needed for cough.  . Cholecalciferol (VITAMIN D3) 2000 units TABS Take 2,000 Units by mouth daily.  . fexofenadine (ALLEGRA) 180 MG tablet Take 180 mg by mouth daily.  . fluvoxaMINE (LUVOX) 50 MG tablet Take 200 mg by mouth every morning.   . furosemide (LASIX) 20 MG tablet Take 20 mg by mouth daily as needed for fluid or edema.  Marland Kitchen gemfibrozil (LOPID) 600 MG tablet Take 600 mg by mouth 2 (two) times daily.   Marland Kitchen ipratropium (ATROVENT) 0.06 % nasal spray Place 2 sprays into both nostrils 4 (four) times daily.  Marland Kitchen latanoprost (XALATAN) 0.005 % ophthalmic solution Place 1 drop into both eyes at bedtime.   Marland Kitchen leuprolide (LUPRON) 30 MG injection Inject 30 mg into the muscle every 4 (four) months.  . levothyroxine (SYNTHROID, LEVOTHROID) 175 MCG tablet Take 175 mcg by mouth daily before breakfast.  . lisinopril (PRINIVIL,ZESTRIL) 10 MG tablet Take 10 mg by mouth daily.  . nitroGLYCERIN (NITROSTAT) 0.4 MG SL tablet PLACE 1 TABLET (0.4 MG TOTAL) UNDER THE TONGUE EVERY 5 (FIVE) MINUTES AS NEEDED FOR CHEST PAIN.  Marland Kitchen pantoprazole (PROTONIX) 40 MG tablet Take  2 tablets (80 mg total) by mouth daily. (Patient taking differently: Take 80 mg by mouth at bedtime. )  . zolpidem (AMBIEN) 10 MG tablet Take 10 mg by mouth at bedtime as needed for sleep.      Allergies:   Hydrocodone-homatropine; Niacin and related; and Tagamet [cimetidine]   Social History   Socioeconomic History  . Marital status: Married    Spouse name: None  . Number of children: None  . Years of education: None  . Highest education level: None  Social Needs  . Financial resource strain: None  . Food insecurity - worry: None  . Food insecurity - inability: None  . Transportation needs - medical: None  . Transportation needs - non-medical: None  Occupational History  . None  Tobacco Use  . Smoking status: Never Smoker  . Smokeless tobacco: Never Used  Substance and Sexual Activity  . Alcohol use: Yes  Alcohol/week: 0.0 oz    Comment: 06/20/2014 "1-2 drinks a couple times/month"  . Drug use: No  . Sexual activity: Yes  Other Topics Concern  . None  Social History Narrative  . None     Family History: The patient's family history includes CAD in his brother; Cancer in his father; Heart disease in his father; Hyperlipidemia in his father.  ROS:   Please see the history of present illness.    ROS  All other systems reviewed and negative.   EKGs/Labs/Other Studies Reviewed:    The following studies were reviewed today: none  EKG:  EKG is not ordered today.    Recent Labs: 03/28/2017: ALT 29; Magnesium 1.7; TSH 1.042 04/05/2017: BUN 18; Creatinine, Ser 1.15; Hemoglobin 13.9; Platelets 255; Potassium 5.1; Sodium 137   Recent Lipid Panel    Component Value Date/Time   CHOL 124 03/29/2017 0554   CHOL 140 10/11/2016 0853   TRIG 169 (H) 03/29/2017 0554   HDL 33 (L) 03/29/2017 0554   HDL 39 (L) 10/11/2016 0853   CHOLHDL 3.8 03/29/2017 0554   VLDL 34 03/29/2017 0554   LDLCALC 57 03/29/2017 0554   LDLCALC 72 10/11/2016 0853    Physical Exam:    VS:  BP  118/80   Pulse (!) 54   Ht 5\' 11"  (1.803 m)   Wt 272 lb (123.4 kg)   BMI 37.94 kg/m     Wt Readings from Last 3 Encounters:  06/27/17 272 lb (123.4 kg)  04/05/17 263 lb (119.3 kg)  03/29/17 269 lb (122 kg)     GEN:  Well nourished, well developed in no acute distress HEENT: Normal NECK: No JVD; No carotid bruits LYMPHATICS: No lymphadenopathy CARDIAC: RRR, no murmurs, rubs, gallops RESPIRATORY:  Clear to auscultation without rales, wheezing or rhonchi  ABDOMEN: Soft, non-tender, non-distended MUSCULOSKELETAL:  No edema; No deformity  SKIN: Warm and dry NEUROLOGIC:  Alert and oriented x 3 PSYCHIATRIC:  Normal affect   ASSESSMENT:    1. Coronary artery disease due to lipid rich plaque   2. Essential hypertension   3. Dilated aortic root ([place])   4. Pure hypercholesterolemia   5. Chronic diastolic heart failure (HCC)    PLAN:    In order of problems listed above:  1.  ASCAD - S/P DES to LCx 05/2014 with residual LAD disease with repeat cath 02/2017 with patent stents and otherwise mild nonobstructive dz.  He denies any anginal chest pain or SOB.  He will continue on ASA 81mg  daily, atorvastatin.  2.  HTN - Bp is well controlled on exam today.  He says that at home his BP is around 100-141mmhg.  I instructed him to decrease Lisinopril to 5mg  daily.  3.  Dilated aortic root (10mm by echo 10/2016) - he will continue on ASA and statin.   4.   Hyperlipidemia with LDL goal < 70.  He wlil continue on Atorvastatin 40mg  and Lopid 600mg  daily.  LDL 57 this past fall but 84 in December.  I encouraged him to watch his fat intake and carbs.  I will repeat FLP in 4 months.   5.  Chronic diastolic CHF - he appears euvolemic on exam today.  His weight is stable.  He will continue Lasix PRN.       Medication Adjustments/Labs and Tests Ordered: Current medicines are reviewed at length with the patient today.  Concerns regarding medicines are outlined above.  No orders of the defined types  were placed in  this encounter.  No orders of the defined types were placed in this encounter.   Signed, Fransico Him, MD  06/27/2017 8:16 AM    [place]

## 2017-06-27 ENCOUNTER — Encounter: Payer: Self-pay | Admitting: Cardiology

## 2017-06-27 ENCOUNTER — Ambulatory Visit: Payer: BC Managed Care – PPO | Admitting: Cardiology

## 2017-06-27 VITALS — BP 118/80 | HR 54 | Ht 71.0 in | Wt 272.0 lb

## 2017-06-27 DIAGNOSIS — I2510 Atherosclerotic heart disease of native coronary artery without angina pectoris: Secondary | ICD-10-CM | POA: Diagnosis not present

## 2017-06-27 DIAGNOSIS — E7800 Pure hypercholesterolemia, unspecified: Secondary | ICD-10-CM

## 2017-06-27 DIAGNOSIS — I5032 Chronic diastolic (congestive) heart failure: Secondary | ICD-10-CM | POA: Diagnosis not present

## 2017-06-27 DIAGNOSIS — I77810 Thoracic aortic ectasia: Secondary | ICD-10-CM

## 2017-06-27 DIAGNOSIS — I2583 Coronary atherosclerosis due to lipid rich plaque: Secondary | ICD-10-CM | POA: Diagnosis not present

## 2017-06-27 DIAGNOSIS — I10 Essential (primary) hypertension: Secondary | ICD-10-CM

## 2017-06-27 MED ORDER — LISINOPRIL 5 MG PO TABS: 5.0000 mg | ORAL_TABLET | Freq: Every day | ORAL | 3 refills | Status: DC

## 2017-06-27 NOTE — Patient Instructions (Signed)
Medication Instructions:  Your physician has recommended you make the following change in your medication:   DECREASE: lisinopril to 5 mg once a day   Labwork: Your physician recommends that you return for lab work in: 4 months for fasting lipids and liver function test.  Testing/Procedures: None ordered   Follow-Up: Your physician wants you to follow-up in: 6 months with Dr. Radford Pax. You will receive a reminder letter in the mail two months in advance. If you don't receive a letter, please call our office to schedule the follow-up appointment.  Any Other Special Instructions Will Be Listed Below (If Applicable).     If you need a refill on your cardiac medications before your next appointment, please call your pharmacy.

## 2017-06-28 ENCOUNTER — Encounter (HOSPITAL_COMMUNITY)
Admission: RE | Admit: 2017-06-28 | Discharge: 2017-06-28 | Disposition: A | Discharge status: Home / Self Care | Payer: Self-pay | Source: Ambulatory Visit | Attending: Cardiology | Admitting: Cardiology

## 2017-06-30 ENCOUNTER — Encounter (HOSPITAL_COMMUNITY): Payer: Self-pay

## 2017-07-05 ENCOUNTER — Encounter (HOSPITAL_COMMUNITY)
Admission: RE | Admit: 2017-07-05 | Discharge: 2017-07-05 | Disposition: A | Discharge status: Home / Self Care | Payer: Self-pay | Source: Ambulatory Visit | Attending: Cardiology | Admitting: Cardiology

## 2017-07-05 DIAGNOSIS — I2510 Atherosclerotic heart disease of native coronary artery without angina pectoris: Secondary | ICD-10-CM | POA: Insufficient documentation

## 2017-07-07 ENCOUNTER — Encounter (HOSPITAL_COMMUNITY)
Admission: RE | Admit: 2017-07-07 | Discharge: 2017-07-07 | Disposition: A | Discharge status: Home / Self Care | Payer: Self-pay | Source: Ambulatory Visit | Attending: Cardiology | Admitting: Cardiology

## 2017-07-12 ENCOUNTER — Encounter (HOSPITAL_COMMUNITY)
Admission: RE | Admit: 2017-07-12 | Discharge: 2017-07-12 | Disposition: A | Discharge status: Home / Self Care | Payer: Self-pay | Source: Ambulatory Visit | Attending: Cardiology | Admitting: Cardiology

## 2017-08-02 ENCOUNTER — Other Ambulatory Visit: Payer: Self-pay

## 2017-08-02 ENCOUNTER — Encounter: Payer: Self-pay | Admitting: Cardiology

## 2017-08-02 ENCOUNTER — Encounter (HOSPITAL_COMMUNITY)
Admission: RE | Admit: 2017-08-02 | Discharge: 2017-08-02 | Disposition: A | Discharge status: Home / Self Care | Payer: Self-pay | Source: Ambulatory Visit | Attending: Cardiology | Admitting: Cardiology

## 2017-08-02 DIAGNOSIS — I2510 Atherosclerotic heart disease of native coronary artery without angina pectoris: Secondary | ICD-10-CM | POA: Insufficient documentation

## 2017-08-02 MED ORDER — ATORVASTATIN CALCIUM 40 MG PO TABS: 40.0000 mg | ORAL_TABLET | Freq: Every day | ORAL | 3 refills | Status: DC

## 2017-08-02 MED ORDER — FUROSEMIDE 20 MG PO TABS: 20.0000 mg | ORAL_TABLET | Freq: Every day | ORAL | 3 refills | Status: DC | PRN

## 2017-08-02 MED ORDER — LISINOPRIL 5 MG PO TABS: 5.0000 mg | ORAL_TABLET | Freq: Every day | ORAL | 3 refills | Status: DC

## 2017-08-02 MED ORDER — PANTOPRAZOLE SODIUM 40 MG PO TBEC: 80.0000 mg | DELAYED_RELEASE_TABLET | Freq: Every day | ORAL | 2 refills | Status: DC

## 2017-08-04 ENCOUNTER — Encounter (HOSPITAL_COMMUNITY)
Admission: RE | Admit: 2017-08-04 | Discharge: 2017-08-04 | Disposition: A | Discharge status: Home / Self Care | Payer: Self-pay | Source: Ambulatory Visit | Attending: Cardiology | Admitting: Cardiology

## 2017-08-10 ENCOUNTER — Telehealth: Payer: Self-pay | Admitting: Cardiology

## 2017-08-10 NOTE — Telephone Encounter (Signed)
[facility] mail order pharmacy calling stating that there is an interaction between Atorvastatin and Gemfibrozil and wanted to know if Dr. Radford Pax was aware of this. Representative would like a call back ASAP at [phone], Ref# [id number]. Please address.

## 2017-08-10 NOTE — Telephone Encounter (Signed)
I returned call to [facility], pharmacist at [facility]. I informed him that I review medication with [facility] and is states it's okay to continue taking medication as long as patient does not c/o of muscle aches. I explained to [place] that I spoke with patient, pt states he feels fine and refuses to switch to anything new. He verbalized understanding and thankful for the call

## 2017-08-11 ENCOUNTER — Encounter (HOSPITAL_COMMUNITY)
Admission: RE | Admit: 2017-08-11 | Discharge: 2017-08-11 | Disposition: A | Discharge status: Home / Self Care | Payer: Self-pay | Source: Ambulatory Visit | Attending: Cardiology | Admitting: Cardiology

## 2017-08-17 ENCOUNTER — Encounter (HOSPITAL_COMMUNITY)
Admission: RE | Admit: 2017-08-17 | Discharge: 2017-08-17 | Disposition: A | Discharge status: Home / Self Care | Payer: Self-pay | Source: Ambulatory Visit | Attending: Cardiology | Admitting: Cardiology

## 2017-08-24 ENCOUNTER — Encounter (HOSPITAL_COMMUNITY)
Admission: RE | Admit: 2017-08-24 | Discharge: 2017-08-24 | Disposition: A | Discharge status: Home / Self Care | Payer: Self-pay | Source: Ambulatory Visit | Attending: Cardiology | Admitting: Cardiology

## 2017-08-29 ENCOUNTER — Encounter (HOSPITAL_COMMUNITY)
Admission: RE | Admit: 2017-08-29 | Discharge: 2017-08-29 | Disposition: A | Discharge status: Home / Self Care | Payer: Self-pay | Source: Ambulatory Visit | Attending: Cardiology | Admitting: Cardiology

## 2017-08-29 DIAGNOSIS — I2510 Atherosclerotic heart disease of native coronary artery without angina pectoris: Secondary | ICD-10-CM | POA: Insufficient documentation

## 2017-08-31 ENCOUNTER — Encounter (HOSPITAL_COMMUNITY): Payer: Self-pay

## 2017-09-02 ENCOUNTER — Encounter (HOSPITAL_COMMUNITY): Payer: Self-pay

## 2017-09-05 ENCOUNTER — Encounter (HOSPITAL_COMMUNITY): Payer: Self-pay

## 2017-09-07 ENCOUNTER — Encounter (HOSPITAL_COMMUNITY)
Admission: RE | Admit: 2017-09-07 | Discharge: 2017-09-07 | Disposition: A | Discharge status: Home / Self Care | Payer: Self-pay | Source: Ambulatory Visit | Attending: Cardiology | Admitting: Cardiology

## 2017-09-09 ENCOUNTER — Encounter (HOSPITAL_COMMUNITY)
Admission: RE | Admit: 2017-09-09 | Discharge: 2017-09-09 | Disposition: A | Discharge status: Home / Self Care | Payer: Self-pay | Source: Ambulatory Visit | Attending: Cardiology | Admitting: Cardiology

## 2017-09-12 ENCOUNTER — Encounter (HOSPITAL_COMMUNITY): Payer: Self-pay

## 2017-09-14 ENCOUNTER — Encounter (HOSPITAL_COMMUNITY): Payer: Self-pay

## 2017-09-16 ENCOUNTER — Encounter (HOSPITAL_COMMUNITY): Payer: Self-pay

## 2017-09-19 ENCOUNTER — Encounter (HOSPITAL_COMMUNITY)
Admission: RE | Admit: 2017-09-19 | Discharge: 2017-09-19 | Disposition: A | Discharge status: Home / Self Care | Payer: Self-pay | Source: Ambulatory Visit | Attending: Cardiology | Admitting: Cardiology

## 2017-09-21 ENCOUNTER — Encounter (HOSPITAL_COMMUNITY): Payer: Self-pay

## 2017-09-23 ENCOUNTER — Encounter (HOSPITAL_COMMUNITY): Payer: Self-pay

## 2017-09-26 ENCOUNTER — Encounter (HOSPITAL_COMMUNITY): Payer: Self-pay

## 2017-09-28 ENCOUNTER — Encounter (HOSPITAL_COMMUNITY)
Admission: RE | Admit: 2017-09-28 | Discharge: 2017-09-28 | Disposition: A | Discharge status: Home / Self Care | Payer: Self-pay | Source: Ambulatory Visit | Attending: Cardiology | Admitting: Cardiology

## 2017-09-28 DIAGNOSIS — I2510 Atherosclerotic heart disease of native coronary artery without angina pectoris: Secondary | ICD-10-CM | POA: Insufficient documentation

## 2017-09-30 ENCOUNTER — Encounter (HOSPITAL_COMMUNITY): Payer: Self-pay

## 2017-10-03 ENCOUNTER — Encounter (HOSPITAL_COMMUNITY): Payer: Self-pay

## 2017-10-05 ENCOUNTER — Encounter (HOSPITAL_COMMUNITY): Payer: Self-pay

## 2017-10-07 ENCOUNTER — Encounter (HOSPITAL_COMMUNITY): Payer: Self-pay

## 2017-10-10 ENCOUNTER — Encounter (HOSPITAL_COMMUNITY): Payer: Self-pay

## 2017-10-12 ENCOUNTER — Encounter (HOSPITAL_COMMUNITY): Payer: Self-pay

## 2017-10-14 ENCOUNTER — Encounter (HOSPITAL_COMMUNITY): Payer: Self-pay

## 2017-10-17 ENCOUNTER — Encounter (HOSPITAL_COMMUNITY): Payer: Self-pay

## 2017-10-19 ENCOUNTER — Encounter (HOSPITAL_COMMUNITY): Payer: Self-pay

## 2017-10-21 ENCOUNTER — Encounter (HOSPITAL_COMMUNITY)
Admission: RE | Admit: 2017-10-21 | Discharge: 2017-10-21 | Disposition: A | Discharge status: Home / Self Care | Payer: Self-pay | Source: Ambulatory Visit | Attending: Cardiology | Admitting: Cardiology

## 2017-10-25 ENCOUNTER — Other Ambulatory Visit: Payer: Medicare Other | Admitting: *Deleted

## 2017-10-25 DIAGNOSIS — E7800 Pure hypercholesterolemia, unspecified: Secondary | ICD-10-CM

## 2017-10-25 LAB — LIPID PANEL
Chol/HDL Ratio: 3.6 ratio (ref 0.0–5.0)
Cholesterol, Total: 135 mg/dL (ref 100–199)
HDL: 38 mg/dL — ABNORMAL LOW (ref >39)
LDL Calculated: 60 mg/dL (ref 0–99)
Triglycerides: 185 mg/dL — ABNORMAL HIGH (ref 0–149)
VLDL Cholesterol Cal: 37 mg/dL (ref 5–40)

## 2017-10-25 LAB — HEPATIC FUNCTION PANEL
ALT: 25 IU/L (ref 0–44)
AST: 17 IU/L (ref 0–40)
Albumin: 4.3 g/dL (ref 3.6–4.8)
Alkaline Phosphatase: 80 IU/L (ref 39–117)
Bilirubin Total: 0.2 mg/dL (ref 0.0–1.2)
Bilirubin, Direct: 0.06 mg/dL (ref 0.00–0.40)
Total Protein: 6.7 g/dL (ref 6.0–8.5)

## 2017-10-26 ENCOUNTER — Other Ambulatory Visit: Payer: Self-pay

## 2017-10-26 ENCOUNTER — Encounter (HOSPITAL_COMMUNITY)
Admission: RE | Admit: 2017-10-26 | Discharge: 2017-10-26 | Disposition: A | Discharge status: Home / Self Care | Payer: Medicare Other | Source: Ambulatory Visit | Attending: Cardiology | Admitting: Cardiology

## 2017-10-26 ENCOUNTER — Telehealth: Payer: Self-pay

## 2017-10-26 DIAGNOSIS — E785 Hyperlipidemia, unspecified: Secondary | ICD-10-CM

## 2017-10-26 NOTE — Telephone Encounter (Signed)
Notes recorded by Teressa Senter, RN on 10/26/2017 at 1:41 PM EDT Pt made aware of lab results. Patient stated he had been on vacation for 3 weeks, he states he is fully aware on limiting carbs and complex sugars. Pt states he will come in for repeat HbA1c and TSH during his office visit with Dr. Radford Pax. Orders placed. He stated understanding and thankful for the call   Notes recorded by Sueanne Margarita, MD on 10/25/2017 at 6:24 PM EDT Triglycerides are still too high - please have patient limit carbs and complex sugars and focus more on vegetables. HbA1C and TSH were normal last fall. Please repeat these to make sure there has been no change

## 2017-10-28 ENCOUNTER — Encounter (HOSPITAL_COMMUNITY): Payer: Self-pay

## 2017-10-31 ENCOUNTER — Encounter (HOSPITAL_COMMUNITY): Payer: Self-pay

## 2017-10-31 DIAGNOSIS — I2510 Atherosclerotic heart disease of native coronary artery without angina pectoris: Secondary | ICD-10-CM | POA: Insufficient documentation

## 2017-11-02 ENCOUNTER — Encounter (HOSPITAL_COMMUNITY)
Admission: RE | Admit: 2017-11-02 | Discharge: 2017-11-02 | Disposition: A | Discharge status: Home / Self Care | Payer: Self-pay | Source: Ambulatory Visit | Attending: Cardiology | Admitting: Cardiology

## 2017-11-04 ENCOUNTER — Encounter (HOSPITAL_COMMUNITY): Payer: Self-pay

## 2017-11-07 ENCOUNTER — Encounter (HOSPITAL_COMMUNITY): Payer: Self-pay

## 2017-11-09 ENCOUNTER — Encounter (HOSPITAL_COMMUNITY)
Admission: RE | Admit: 2017-11-09 | Discharge: 2017-11-09 | Disposition: A | Discharge status: Home / Self Care | Payer: Self-pay | Source: Ambulatory Visit | Attending: Cardiology | Admitting: Cardiology

## 2017-11-11 ENCOUNTER — Encounter (HOSPITAL_COMMUNITY)
Admission: RE | Admit: 2017-11-11 | Discharge: 2017-11-11 | Disposition: A | Discharge status: Home / Self Care | Payer: Self-pay | Source: Ambulatory Visit | Attending: Cardiology | Admitting: Cardiology

## 2017-11-14 ENCOUNTER — Encounter (HOSPITAL_COMMUNITY)
Admission: RE | Admit: 2017-11-14 | Discharge: 2017-11-14 | Disposition: A | Discharge status: Home / Self Care | Payer: Self-pay | Source: Ambulatory Visit | Attending: Cardiology | Admitting: Cardiology

## 2017-11-16 ENCOUNTER — Ambulatory Visit (HOSPITAL_COMMUNITY): Payer: Medicare Other | Attending: Cardiology

## 2017-11-16 ENCOUNTER — Other Ambulatory Visit: Payer: Self-pay

## 2017-11-16 ENCOUNTER — Encounter (HOSPITAL_COMMUNITY): Payer: Self-pay

## 2017-11-16 DIAGNOSIS — I10 Essential (primary) hypertension: Secondary | ICD-10-CM | POA: Diagnosis not present

## 2017-11-16 DIAGNOSIS — E785 Hyperlipidemia, unspecified: Secondary | ICD-10-CM | POA: Insufficient documentation

## 2017-11-16 DIAGNOSIS — I351 Nonrheumatic aortic (valve) insufficiency: Secondary | ICD-10-CM | POA: Diagnosis not present

## 2017-11-16 DIAGNOSIS — I2510 Atherosclerotic heart disease of native coronary artery without angina pectoris: Secondary | ICD-10-CM | POA: Diagnosis not present

## 2017-11-16 DIAGNOSIS — I77810 Thoracic aortic ectasia: Secondary | ICD-10-CM | POA: Insufficient documentation

## 2017-11-18 ENCOUNTER — Telehealth: Payer: Self-pay

## 2017-11-18 ENCOUNTER — Encounter (HOSPITAL_COMMUNITY): Payer: Self-pay

## 2017-11-18 DIAGNOSIS — I77810 Thoracic aortic ectasia: Secondary | ICD-10-CM

## 2017-11-18 NOTE — Telephone Encounter (Signed)
Notes recorded by Loren Racer, LPN on 7/40/8144 at 81:85 PM EDT Informed pt of results. Pt verbalized understanding.   Notes recorded by Sueanne Margarita, MD on 11/17/2017 at 1:26 PM EDT Echo showed normal left ventricular function with EF 60 to 65% with increased stiffness of the heart muscle. There is mild aortic insufficiency. The aortic root is mildly dilated at 39 mm. Compared to a year ago aortic root dimension was 40 mm and ascending aorta 42 mm. Echo today stable compared to 2015 chest CT showing 39 mm aortic root. We will continue to monitor with serial echoes. Please repeat echo in 1 year.

## 2017-11-21 ENCOUNTER — Encounter (HOSPITAL_COMMUNITY)
Admission: RE | Admit: 2017-11-21 | Discharge: 2017-11-21 | Disposition: A | Discharge status: Home / Self Care | Payer: Medicare Other | Source: Ambulatory Visit | Attending: Cardiology | Admitting: Cardiology

## 2017-11-23 ENCOUNTER — Encounter (HOSPITAL_COMMUNITY): Payer: Self-pay

## 2017-11-25 ENCOUNTER — Encounter (HOSPITAL_COMMUNITY): Payer: Self-pay

## 2017-11-28 ENCOUNTER — Encounter (HOSPITAL_COMMUNITY): Payer: Self-pay

## 2017-11-28 DIAGNOSIS — I2510 Atherosclerotic heart disease of native coronary artery without angina pectoris: Secondary | ICD-10-CM | POA: Insufficient documentation

## 2017-11-30 ENCOUNTER — Encounter (HOSPITAL_COMMUNITY)
Admission: RE | Admit: 2017-11-30 | Discharge: 2017-11-30 | Disposition: A | Discharge status: Home / Self Care | Payer: Self-pay | Source: Ambulatory Visit | Attending: Cardiology | Admitting: Cardiology

## 2017-12-02 ENCOUNTER — Encounter (HOSPITAL_COMMUNITY): Payer: Self-pay

## 2017-12-05 ENCOUNTER — Encounter (HOSPITAL_COMMUNITY): Payer: Self-pay

## 2017-12-07 ENCOUNTER — Encounter (HOSPITAL_COMMUNITY)
Admission: RE | Admit: 2017-12-07 | Discharge: 2017-12-07 | Disposition: A | Discharge status: Home / Self Care | Payer: Self-pay | Source: Ambulatory Visit | Attending: Cardiology | Admitting: Cardiology

## 2017-12-09 ENCOUNTER — Encounter (HOSPITAL_COMMUNITY): Payer: Self-pay

## 2017-12-12 ENCOUNTER — Encounter (HOSPITAL_COMMUNITY)
Admission: RE | Admit: 2017-12-12 | Discharge: 2017-12-12 | Disposition: A | Discharge status: Home / Self Care | Payer: Medicare Other | Source: Ambulatory Visit | Attending: Cardiology | Admitting: Cardiology

## 2017-12-13 ENCOUNTER — Ambulatory Visit: Payer: Medicare Other | Admitting: Cardiology

## 2017-12-13 ENCOUNTER — Other Ambulatory Visit: Payer: Medicare Other

## 2017-12-13 ENCOUNTER — Encounter: Payer: Self-pay | Admitting: Cardiology

## 2017-12-13 VITALS — BP 159/89 | HR 45 | Ht 71.0 in | Wt 266.8 lb

## 2017-12-13 DIAGNOSIS — I5032 Chronic diastolic (congestive) heart failure: Secondary | ICD-10-CM | POA: Diagnosis not present

## 2017-12-13 DIAGNOSIS — R0602 Shortness of breath: Secondary | ICD-10-CM

## 2017-12-13 DIAGNOSIS — K219 Gastro-esophageal reflux disease without esophagitis: Secondary | ICD-10-CM | POA: Diagnosis not present

## 2017-12-13 DIAGNOSIS — I2510 Atherosclerotic heart disease of native coronary artery without angina pectoris: Secondary | ICD-10-CM

## 2017-12-13 DIAGNOSIS — I10 Essential (primary) hypertension: Secondary | ICD-10-CM

## 2017-12-13 DIAGNOSIS — R490 Dysphonia: Secondary | ICD-10-CM

## 2017-12-13 DIAGNOSIS — I77810 Thoracic aortic ectasia: Secondary | ICD-10-CM | POA: Diagnosis not present

## 2017-12-13 DIAGNOSIS — I2583 Coronary atherosclerosis due to lipid rich plaque: Secondary | ICD-10-CM | POA: Diagnosis not present

## 2017-12-13 DIAGNOSIS — E7800 Pure hypercholesterolemia, unspecified: Secondary | ICD-10-CM

## 2017-12-13 DIAGNOSIS — E785 Hyperlipidemia, unspecified: Secondary | ICD-10-CM

## 2017-12-13 LAB — CBC WITH DIFFERENTIAL/PLATELET
Basophils Absolute: 0 10*3/uL (ref 0.0–0.2)
Basos: 1 %
EOS (ABSOLUTE): 0.1 10*3/uL (ref 0.0–0.4)
Eos: 3 %
Hematocrit: 36.6 % — ABNORMAL LOW (ref 37.5–51.0)
Hemoglobin: 12.5 g/dL — ABNORMAL LOW (ref 13.0–17.7)
Immature Grans (Abs): 0 10*3/uL (ref 0.0–0.1)
Immature Granulocytes: 0 %
Lymphocytes Absolute: 1.6 10*3/uL (ref 0.7–3.1)
Lymphs: 37 %
MCH: 29.2 pg (ref 26.6–33.0)
MCHC: 34.2 g/dL (ref 31.5–35.7)
MCV: 86 fL (ref 79–97)
Monocytes Absolute: 0.4 10*3/uL (ref 0.1–0.9)
Monocytes: 9 %
Neutrophils Absolute: 2.2 10*3/uL (ref 1.4–7.0)
Neutrophils: 50 %
Platelets: 265 10*3/uL (ref 150–450)
RBC: 4.28 x10E6/uL (ref 4.14–5.80)
RDW: 14.1 % (ref 12.3–15.4)
WBC: 4.4 10*3/uL (ref 3.4–10.8)

## 2017-12-13 LAB — PRO B NATRIURETIC PEPTIDE: NT-Pro BNP: 64 pg/mL (ref 0–376)

## 2017-12-13 NOTE — Patient Instructions (Signed)
Medication Instructions:  Your physician recommends that you continue on your current medications as directed. Please refer to the Current Medication list given to you today.  Labwork: Your physician recommends that you have lab work today- CBC, TSH, BNP   Testing/Procedures: NONE ordered today  Follow-Up: Your physician wants you to follow-up in: 1 year with Dr. Radford Pax. You will receive a reminder letter in the mail two months in advance. If you don't receive a letter, please call our office to schedule the follow-up appointment.  You have been referred to Dr. Wilburn Cornelia.     If you need a refill on your cardiac medications before your next appointment, please call your pharmacy.

## 2017-12-13 NOTE — Progress Notes (Signed)
Cardiology Office Note:    Date:  12/13/2017   ID:  Blake Carter, DOB [birth year], MRN [id number]  PCP:  Hulan Fess, MD  Cardiologist:  No primary care provider on file.    Referring MD: Hulan Fess, MD   Chief Complaint  Patient presents with  . Coronary Artery Disease  . Hypertension  . Hyperlipidemia    History of Present Illness:    Blake Carter is a 67 y.o. male with a hx of CAD (DES to LCx 05/2014 with residual LAD disease with repeat cath 02/2017 with patent stents and otherwise mild nonobstructive dz), HTN, dyslipidemia, sinus bradycardia, dilated aortic root (10mm by echo 6.2018) and chronic diastolic CHF.    He is here today for followup and is doing well.  He denies any chest pain or pressure,PND, orthopnea, LE edema, dizziness, palpitations or syncope. He is compliant with his meds and is tolerating meds with no SE. he still does complain of some shortness of breath.  Repeat cath for similar symptoms last year showed widely patent stents and echo showed normal LV function.  LVEDP was mildly elevated.  He says that the shortness of breath seems to be up in his throat.  He does have a history of reflux and does clear his throat a lot and is noted hoarseness recently.  He also says that he had thyroid issues when he was younger he had a partial thyroidectomy and is worried there may be something wrong with his thyroid again.  Past Medical History:  Diagnosis Date  . Allergic rhinitis   . Arthritis    "a little bit in some of my joints" (06/20/2014)  . Chronic diastolic CHF (congestive heart failure) ([place])   . Coronary artery disease    a. 05/2014 Cath/PCI: LM nl, LAD 40p, 60-70d (FFR 0.80->3.0x24 Synergy DES), D2 40, LCX 90d (2.25x12 Synergy DES), RCA 20-28m, EF 60-65%. b. Cath 02/2017 without acute change, elev LVEDP suggestive of dCHF.  Marland Kitchen Depression   . Dilated aortic root (H[facility])    aortic root 58mm and ascending aorta 63mm by echo 10/2016  . ED (erectile  dysfunction)   . Environmental allergies    dry cough  . GERD (gastroesophageal reflux disease)   . History of hiatal hernia   . Hydronephrosis of right kidney    a. s/p ureteral stenting in the past.  . Hyperlipidemia    under control  . Hypertension   . Hypothyroidism   . OCD (obsessive compulsive disorder)   . Panic disorder   . PONV (postoperative nausea and vomiting)    after thyroid surgery no problems in 10 years   . Prostate cancer [facility])    prostate cancer- robotic prostatectomy - followed by radiation because of positive lymph node--and pt on lupron injections  . Sinus bradycardia    a. chronic, asymptomatic.  24 hour Holter 10/2016 showed Sinus bradycardia, sinus arrhythmia and normal sinus rhythm with average heart rate 50bpm and heart rate ranged from 32 to 100bpm.  . SUI (stress urinary incontinence), male    S/P ROBOTIC PROSTATECTOMY AND RADIATION TX    Past Surgical History:  Procedure Laterality Date  . COLONOSCOPY  07/2013   Dr. Oletta Lamas  . CORONARY ANGIOPLASTY WITH STENT PLACEMENT  06/20/2014   "2"  . CYSTOSCOPY  11/30/2011   Procedure: CYSTOSCOPY;  Surgeon: Reece Packer, MD;  Location: [facility];  Service: Urology;  Laterality: N/A;  Inplantation of Artificial Sphincter and Cystoscopy  . CYSTOSCOPY WITH RETROGRADE PYELOGRAM, URETEROSCOPY AND  STENT PLACEMENT Right 02/11/2014   Procedure: CYSTOSCOPY WITH RETROGRADE PYELOGRAM, URETEROSCOPY ,URETERAL BIOPSY AND STENT PLACEMENT;  Surgeon: Raynelle Bring, MD;  Location: [facility];  Service: Urology;  Laterality: Right;  . CYSTOSCOPY WITH RETROGRADE PYELOGRAM, URETEROSCOPY AND STENT PLACEMENT Right 04/15/2014   Procedure: CYSTOSCOPY WITH RETROGRADE PYELOGRAM, URETEROSCOPY, BIOPSY AND STENT PLACEMENT;  Surgeon: Raynelle Bring, MD;  Location: [facility];  Service: Urology;  Laterality: Right;  . LEFT HEART CATH AND CORONARY ANGIOGRAPHY N/A 03/29/2017   Procedure: LEFT HEART CATH AND CORONARY ANGIOGRAPHY;  Surgeon: Leonie Man,  MD;  Location: [facility];  Service: Cardiovascular;  Laterality: N/A;  . LEFT HEART CATHETERIZATION WITH CORONARY ANGIOGRAM N/A 06/20/2014   Procedure: LEFT HEART CATHETERIZATION WITH CORONARY ANGIOGRAM;  Surgeon: Leonie Man, MD;  Location: [facility] CATH LAB;  Service: Cardiovascular;  Laterality: N/A;  . PERCUTANEOUS CORONARY STENT INTERVENTION (PCI-S)  06/20/2014   Procedure: PERCUTANEOUS CORONARY STENT INTERVENTION (PCI-S);  Surgeon: Leonie Man, MD;  Location: [facility] CATH LAB;  Service: Cardiovascular;;  LAD and Circumflex  . REFRACTIVE SURGERY Bilateral 2004  . ROBOT ASSISTED LAPAROSCOPIC RADICAL PROSTATECTOMY  05/2010  . THYROIDECTOMY, PARTIAL  10/1974  . TONSILLECTOMY  1956   . TOTAL THYROIDECTOMY  10/2012   "nuked it"  . UPPER GI ENDOSCOPY     food impaction 2009 done by Dr Oletta Lamas  . URINARY SPHINCTER IMPLANT  11/30/2011   Procedure: ARTIFICIAL URINARY SPHINCTER;  Surgeon: Reece Packer, MD;  Location: [facility];  Service: Urology;  Laterality: N/A;    Current Medications: Current Meds  Medication Sig  . aspirin EC 81 MG tablet Take 81 mg by mouth daily.  Marland Kitchen atorvastatin (LIPITOR) 40 MG tablet Take 1 tablet (40 mg total) by mouth daily.  . Cholecalciferol (VITAMIN D3) 2000 units TABS Take 2,000 Units by mouth daily.  . fexofenadine (ALLEGRA) 180 MG tablet Take 180 mg by mouth daily.  . fluvoxaMINE (LUVOX) 50 MG tablet Take 200 mg by mouth every morning.   . furosemide (LASIX) 20 MG tablet Take 1 tablet (20 mg total) by mouth daily as needed for fluid or edema.  Marland Kitchen gemfibrozil (LOPID) 600 MG tablet Take 600 mg by mouth 2 (two) times daily.   Marland Kitchen latanoprost (XALATAN) 0.005 % ophthalmic solution Place 1 drop into both eyes at bedtime.   Marland Kitchen leuprolide (LUPRON) 30 MG injection Inject 30 mg into the muscle every 4 (four) months.  . levothyroxine (SYNTHROID, LEVOTHROID) 175 MCG tablet Take 175 mcg by mouth daily before breakfast.  . lisinopril (PRINIVIL,ZESTRIL) 5 MG tablet Take 1  tablet (5 mg total) by mouth daily.  . nitroGLYCERIN (NITROSTAT) 0.4 MG SL tablet PLACE 1 TABLET (0.4 MG TOTAL) UNDER THE TONGUE EVERY 5 (FIVE) MINUTES AS NEEDED FOR CHEST PAIN.  Marland Kitchen pantoprazole (PROTONIX) 40 MG tablet Take 2 tablets (80 mg total) by mouth daily.  Marland Kitchen zolpidem (AMBIEN) 10 MG tablet Take 10 mg by mouth at bedtime as needed for sleep.      Allergies:   Hydrocodone-homatropine; Niacin and related; and Tagamet [cimetidine]   Social History   Socioeconomic History  . Marital status: Married    Spouse name: Not on file  . Number of children: Not on file  . Years of education: Not on file  . Highest education level: Not on file  Occupational History  . Not on file  Social Needs  . Financial resource strain: Not on file  . Food insecurity:    Worry: Not on file  Inability: Not on file  . Transportation needs:    Medical: Not on file    Non-medical: Not on file  Tobacco Use  . Smoking status: Never Smoker  . Smokeless tobacco: Never Used  Substance and Sexual Activity  . Alcohol use: Yes    Alcohol/week: 0.0 oz    Comment: 06/20/2014 "1-2 drinks a couple times/month"  . Drug use: No  . Sexual activity: Yes  Lifestyle  . Physical activity:    Days per week: Not on file    Minutes per session: Not on file  . Stress: Not on file  Relationships  . Social connections:    Talks on phone: Not on file    Gets together: Not on file    Attends religious service: Not on file    Active member of club or organization: Not on file    Attends meetings of clubs or organizations: Not on file    Relationship status: Not on file  Other Topics Concern  . Not on file  Social History Narrative  . Not on file     Family History: The patient's family history includes CAD in his brother; Cancer in his father; Heart disease in his father; Hyperlipidemia in his father.  ROS:   Please see the history of present illness.    ROS  All other systems reviewed and negative.    EKGs/Labs/Other Studies Reviewed:    The following studies were reviewed today: none  EKG:  EKG is  ordered today.  The ekg ordered today demonstrates bradycardia 45 bpm with no ST-T wave at normalities.  Recent Labs: 03/28/2017: Magnesium 1.7; TSH 1.042 04/05/2017: BUN 18; Creatinine, Ser 1.15; Hemoglobin 13.9; Platelets 255; Potassium 5.1; Sodium 137 10/25/2017: ALT 25   Recent Lipid Panel    Component Value Date/Time   CHOL 135 10/25/2017 0857   TRIG 185 (H) 10/25/2017 0857   HDL 38 (L) 10/25/2017 0857   CHOLHDL 3.6 10/25/2017 0857   CHOLHDL 3.8 03/29/2017 0554   VLDL 34 03/29/2017 0554   LDLCALC 60 10/25/2017 0857    Physical Exam:    VS:  BP (!) 159/89 (BP Location: Right Arm, Patient Position: Sitting, Cuff Size: Large)   Pulse (!) 45   Ht 5\' 11"  (1.803 m)   Wt 266 lb 12.8 oz (121 kg)   BMI 37.21 kg/m     Wt Readings from Last 3 Encounters:  12/13/17 266 lb 12.8 oz (121 kg)  06/27/17 272 lb (123.4 kg)  04/05/17 263 lb (119.3 kg)     GEN:  Well nourished, well developed in no acute distress HEENT: Normal NECK: No JVD; No carotid bruits LYMPHATICS: No lymphadenopathy CARDIAC: RRR, no murmurs, rubs, gallops RESPIRATORY:  Clear to auscultation without rales, wheezing or rhonchi  ABDOMEN: Soft, non-tender, non-distended MUSCULOSKELETAL:  No edema; No deformity  SKIN: Warm and dry NEUROLOGIC:  Alert and oriented x 3 PSYCHIATRIC:  Normal affect   ASSESSMENT:    1. Coronary artery disease due to lipid rich plaque   2. Essential hypertension   3. Dilated aortic root ([place])   4. Chronic diastolic heart failure ([place])   5. Pure hypercholesterolemia   6. SOB (shortness of breath)    PLAN:    In order of problems listed above:  1.  ASCAD - s/p DES to LCx 05/2014 with residual LAD disease with repeat cath 02/2017 with patent stents and otherwise mild nonobstructive dz. this.  He has chronic dyspnea on exertion with patent stents by cath  last fall so it is  unlikely that this is due to coronary ischemia.  He will continue on aspirin 81 mg daily and statin.  2.  HTN -BP is well controlled on exam today.  He will continue on lisinopril 5 mg daily.  3.  Dilated aortic root -stable aortic root at 39 mm and a sending aorta at 42 mm compared to chest CT of 2015.  We will repeat echo in 1 year.  Continue statin.  4.  Chronic diastolic CHF -appears euvolemic on exam and his weight is stable.  He will continue on Lasix 20 mg daily as needed.  Creatinine was normal at 1.09 on 05/04/2017 and potassium was 4.6.  5.  Hyperlipidemia -LDL goal is less than 70.  His LDL was 60 on 10/25/2017 and ALT normal at 25.  He will continue on atorvastatin 40 mg daily and gemfibrozil 600 mg twice daily.  6.  Shortness of breath - ? etiology.  There could be a component of obesity involved in this.  He has had thyroid surgery in the past and is worried that he may have something wrong with his thyroid again.  He has been having hoarseness which is new for him as well as unresolved shortness of breath that he says feels like it is coming from his throat and not from his lungs.  I will refer him to Dr. Wilburn Cornelia with ENT for further evaluation.  He is bradycardic on exam but he is always been bradycardic and a 24-hour Holter monitor done a year ago showed an average heart rate of 50 bpm and he was asymptomatic at that time.  I will check a CBC, BNP and TSH today.   Medication Adjustments/Labs and Tests Ordered: Current medicines are reviewed at length with the patient today.  Concerns regarding medicines are outlined above.  Orders Placed This Encounter  Procedures  . EKG 12-Lead   No orders of the defined types were placed in this encounter.   Signed, Fransico Him, MD  12/13/2017 11:13 AM    [place]

## 2017-12-14 ENCOUNTER — Encounter (HOSPITAL_COMMUNITY): Payer: Self-pay

## 2017-12-14 LAB — LIPID PANEL
Chol/HDL Ratio: 3.5 ratio (ref 0.0–5.0)
Cholesterol, Total: 149 mg/dL (ref 100–199)
HDL: 43 mg/dL (ref >39)
LDL Calculated: 73 mg/dL (ref 0–99)
Triglycerides: 167 mg/dL — ABNORMAL HIGH (ref 0–149)
VLDL Cholesterol Cal: 33 mg/dL (ref 5–40)

## 2017-12-14 LAB — TSH: TSH: 1.45 u[IU]/mL (ref 0.450–4.500)

## 2017-12-14 LAB — HEMOGLOBIN A1C
Est. average glucose Bld gHb Est-mCnc: 123 mg/dL
Hgb A1c MFr Bld: 5.9 % — ABNORMAL HIGH (ref 4.8–5.6)

## 2017-12-16 ENCOUNTER — Encounter (HOSPITAL_COMMUNITY): Payer: Self-pay

## 2017-12-19 ENCOUNTER — Encounter (HOSPITAL_COMMUNITY)
Admission: RE | Admit: 2017-12-19 | Discharge: 2017-12-19 | Disposition: A | Discharge status: Home / Self Care | Payer: Medicare Other | Source: Ambulatory Visit | Attending: Cardiology | Admitting: Cardiology

## 2017-12-19 ENCOUNTER — Encounter: Payer: Self-pay | Admitting: Cardiology

## 2017-12-19 NOTE — Telephone Encounter (Signed)
This encounter was created in error - please disregard.

## 2017-12-19 NOTE — Telephone Encounter (Signed)
Pt calling    Returning call to nurse concerning labs results

## 2017-12-21 ENCOUNTER — Encounter (HOSPITAL_COMMUNITY): Payer: Self-pay

## 2017-12-23 ENCOUNTER — Encounter (HOSPITAL_COMMUNITY): Payer: Self-pay

## 2017-12-26 ENCOUNTER — Encounter (HOSPITAL_COMMUNITY): Payer: Self-pay

## 2017-12-28 ENCOUNTER — Encounter (HOSPITAL_COMMUNITY)
Admission: RE | Admit: 2017-12-28 | Discharge: 2017-12-28 | Disposition: A | Discharge status: Home / Self Care | Payer: Self-pay | Source: Ambulatory Visit | Attending: Cardiology | Admitting: Cardiology

## 2017-12-30 ENCOUNTER — Encounter (HOSPITAL_COMMUNITY)
Admission: RE | Admit: 2017-12-30 | Discharge: 2017-12-30 | Disposition: A | Discharge status: Home / Self Care | Payer: Self-pay | Source: Ambulatory Visit | Attending: Cardiology | Admitting: Cardiology

## 2017-12-30 DIAGNOSIS — I2510 Atherosclerotic heart disease of native coronary artery without angina pectoris: Secondary | ICD-10-CM | POA: Insufficient documentation

## 2018-01-02 ENCOUNTER — Encounter (HOSPITAL_COMMUNITY): Payer: Self-pay

## 2018-01-04 ENCOUNTER — Encounter (HOSPITAL_COMMUNITY): Payer: Self-pay

## 2018-01-04 DIAGNOSIS — R198 Other specified symptoms and signs involving the digestive system and abdomen: Secondary | ICD-10-CM | POA: Insufficient documentation

## 2018-01-04 DIAGNOSIS — R490 Dysphonia: Secondary | ICD-10-CM | POA: Insufficient documentation

## 2018-01-04 DIAGNOSIS — R0989 Other specified symptoms and signs involving the circulatory and respiratory systems: Secondary | ICD-10-CM | POA: Insufficient documentation

## 2018-01-06 ENCOUNTER — Encounter (HOSPITAL_COMMUNITY)
Admission: RE | Admit: 2018-01-06 | Discharge: 2018-01-06 | Disposition: A | Discharge status: Home / Self Care | Payer: Self-pay | Source: Ambulatory Visit | Attending: Cardiology | Admitting: Cardiology

## 2018-01-09 ENCOUNTER — Encounter (HOSPITAL_COMMUNITY): Payer: Self-pay

## 2018-01-11 ENCOUNTER — Encounter (HOSPITAL_COMMUNITY): Payer: Self-pay

## 2018-01-13 ENCOUNTER — Encounter (HOSPITAL_COMMUNITY)
Admission: RE | Admit: 2018-01-13 | Discharge: 2018-01-13 | Disposition: A | Discharge status: Home / Self Care | Payer: Self-pay | Source: Ambulatory Visit | Attending: Cardiology | Admitting: Cardiology

## 2018-01-16 ENCOUNTER — Encounter (HOSPITAL_COMMUNITY): Payer: Self-pay

## 2018-01-18 ENCOUNTER — Encounter (HOSPITAL_COMMUNITY): Payer: Self-pay

## 2018-01-20 ENCOUNTER — Encounter (HOSPITAL_COMMUNITY): Payer: Self-pay

## 2018-01-23 ENCOUNTER — Encounter (HOSPITAL_COMMUNITY): Payer: Self-pay

## 2018-01-25 ENCOUNTER — Encounter (HOSPITAL_COMMUNITY)
Admission: RE | Admit: 2018-01-25 | Discharge: 2018-01-25 | Disposition: A | Discharge status: Home / Self Care | Payer: Self-pay | Source: Ambulatory Visit | Attending: Cardiology | Admitting: Cardiology

## 2018-01-27 ENCOUNTER — Encounter (HOSPITAL_COMMUNITY): Payer: Self-pay

## 2018-02-01 ENCOUNTER — Encounter (HOSPITAL_COMMUNITY)
Admission: RE | Admit: 2018-02-01 | Discharge: 2018-02-01 | Disposition: A | Discharge status: Home / Self Care | Payer: Medicare Other | Source: Ambulatory Visit | Attending: Cardiology | Admitting: Cardiology

## 2018-02-01 DIAGNOSIS — I2510 Atherosclerotic heart disease of native coronary artery without angina pectoris: Secondary | ICD-10-CM | POA: Insufficient documentation

## 2018-02-03 ENCOUNTER — Encounter (HOSPITAL_COMMUNITY): Payer: Self-pay

## 2018-02-06 ENCOUNTER — Encounter (HOSPITAL_COMMUNITY): Payer: Self-pay

## 2018-02-08 ENCOUNTER — Encounter (HOSPITAL_COMMUNITY)
Admission: RE | Admit: 2018-02-08 | Discharge: 2018-02-08 | Disposition: A | Discharge status: Home / Self Care | Payer: Self-pay | Source: Ambulatory Visit | Attending: Cardiology | Admitting: Cardiology

## 2018-02-10 ENCOUNTER — Encounter (HOSPITAL_COMMUNITY)
Admission: RE | Admit: 2018-02-10 | Discharge: 2018-02-10 | Disposition: A | Discharge status: Home / Self Care | Payer: Self-pay | Source: Ambulatory Visit | Attending: Cardiology | Admitting: Cardiology

## 2018-02-13 ENCOUNTER — Encounter (HOSPITAL_COMMUNITY): Payer: Self-pay

## 2018-02-15 ENCOUNTER — Encounter (HOSPITAL_COMMUNITY): Payer: Self-pay

## 2018-02-17 ENCOUNTER — Encounter (HOSPITAL_COMMUNITY)
Admission: RE | Admit: 2018-02-17 | Discharge: 2018-02-17 | Disposition: A | Discharge status: Home / Self Care | Payer: Medicare Other | Source: Ambulatory Visit | Attending: Cardiology | Admitting: Cardiology

## 2018-02-20 ENCOUNTER — Encounter (HOSPITAL_COMMUNITY): Payer: Self-pay

## 2018-02-22 ENCOUNTER — Encounter (HOSPITAL_COMMUNITY): Payer: Self-pay

## 2018-02-24 ENCOUNTER — Encounter (HOSPITAL_COMMUNITY): Payer: Self-pay

## 2018-02-27 ENCOUNTER — Encounter (HOSPITAL_COMMUNITY): Payer: Self-pay

## 2018-03-01 ENCOUNTER — Encounter (HOSPITAL_COMMUNITY)
Admission: RE | Admit: 2018-03-01 | Discharge: 2018-03-01 | Disposition: A | Discharge status: Home / Self Care | Payer: Self-pay | Source: Ambulatory Visit | Attending: Cardiology | Admitting: Cardiology

## 2018-03-01 DIAGNOSIS — I2510 Atherosclerotic heart disease of native coronary artery without angina pectoris: Secondary | ICD-10-CM | POA: Insufficient documentation

## 2018-03-03 ENCOUNTER — Encounter (HOSPITAL_COMMUNITY): Payer: Self-pay

## 2018-03-06 ENCOUNTER — Encounter (HOSPITAL_COMMUNITY): Payer: Self-pay

## 2018-03-08 ENCOUNTER — Encounter (HOSPITAL_COMMUNITY): Payer: Self-pay

## 2018-03-10 ENCOUNTER — Encounter (HOSPITAL_COMMUNITY): Payer: Self-pay

## 2018-03-13 ENCOUNTER — Encounter (HOSPITAL_COMMUNITY): Payer: Self-pay

## 2018-03-15 ENCOUNTER — Encounter (HOSPITAL_COMMUNITY): Payer: Self-pay

## 2018-03-17 ENCOUNTER — Encounter (HOSPITAL_COMMUNITY): Payer: Self-pay

## 2018-03-20 ENCOUNTER — Encounter (HOSPITAL_COMMUNITY)
Admission: RE | Admit: 2018-03-20 | Discharge: 2018-03-20 | Disposition: A | Discharge status: Home / Self Care | Payer: Self-pay | Source: Ambulatory Visit | Attending: Cardiology | Admitting: Cardiology

## 2018-03-22 ENCOUNTER — Encounter (HOSPITAL_COMMUNITY): Payer: Self-pay

## 2018-03-24 ENCOUNTER — Encounter (HOSPITAL_COMMUNITY)
Admission: RE | Admit: 2018-03-24 | Discharge: 2018-03-24 | Disposition: A | Discharge status: Home / Self Care | Payer: Self-pay | Source: Ambulatory Visit | Attending: Cardiology | Admitting: Cardiology

## 2018-03-27 ENCOUNTER — Encounter (HOSPITAL_COMMUNITY): Payer: Self-pay

## 2018-03-29 ENCOUNTER — Encounter (HOSPITAL_COMMUNITY): Payer: Self-pay

## 2018-03-31 ENCOUNTER — Encounter (HOSPITAL_COMMUNITY)
Admission: RE | Admit: 2018-03-31 | Discharge: 2018-03-31 | Disposition: A | Discharge status: Home / Self Care | Payer: Self-pay | Source: Ambulatory Visit | Attending: Cardiology | Admitting: Cardiology

## 2018-03-31 DIAGNOSIS — I2510 Atherosclerotic heart disease of native coronary artery without angina pectoris: Secondary | ICD-10-CM | POA: Insufficient documentation

## 2018-04-03 ENCOUNTER — Encounter (HOSPITAL_COMMUNITY): Payer: Self-pay

## 2018-04-04 ENCOUNTER — Other Ambulatory Visit: Payer: Self-pay | Admitting: Cardiology

## 2018-04-05 ENCOUNTER — Encounter (HOSPITAL_COMMUNITY)
Admission: RE | Admit: 2018-04-05 | Discharge: 2018-04-05 | Disposition: A | Discharge status: Home / Self Care | Payer: Self-pay | Source: Ambulatory Visit | Attending: Cardiology | Admitting: Cardiology

## 2018-04-07 ENCOUNTER — Encounter (HOSPITAL_COMMUNITY): Payer: Self-pay

## 2018-04-10 ENCOUNTER — Encounter (HOSPITAL_COMMUNITY): Payer: Self-pay

## 2018-04-12 ENCOUNTER — Encounter (HOSPITAL_COMMUNITY): Payer: Self-pay

## 2018-04-14 ENCOUNTER — Encounter (HOSPITAL_COMMUNITY)
Admission: RE | Admit: 2018-04-14 | Discharge: 2018-04-14 | Disposition: A | Discharge status: Home / Self Care | Payer: Self-pay | Source: Ambulatory Visit | Attending: Cardiology | Admitting: Cardiology

## 2018-04-17 ENCOUNTER — Encounter (HOSPITAL_COMMUNITY): Payer: Self-pay

## 2018-04-19 ENCOUNTER — Encounter (HOSPITAL_COMMUNITY): Payer: Self-pay

## 2018-04-21 ENCOUNTER — Encounter (HOSPITAL_COMMUNITY)
Admission: RE | Admit: 2018-04-21 | Discharge: 2018-04-21 | Disposition: A | Discharge status: Home / Self Care | Payer: Self-pay | Source: Ambulatory Visit | Attending: Cardiology | Admitting: Cardiology

## 2018-04-24 ENCOUNTER — Encounter (HOSPITAL_COMMUNITY): Payer: Self-pay

## 2018-04-26 ENCOUNTER — Encounter (HOSPITAL_COMMUNITY): Payer: Self-pay

## 2018-05-01 ENCOUNTER — Encounter (HOSPITAL_COMMUNITY): Payer: Self-pay

## 2018-05-01 DIAGNOSIS — I2510 Atherosclerotic heart disease of native coronary artery without angina pectoris: Secondary | ICD-10-CM | POA: Insufficient documentation

## 2018-05-03 ENCOUNTER — Encounter (HOSPITAL_COMMUNITY)
Admission: RE | Admit: 2018-05-03 | Discharge: 2018-05-03 | Disposition: A | Discharge status: Home / Self Care | Payer: Self-pay | Source: Ambulatory Visit | Attending: Cardiology | Admitting: Cardiology

## 2018-05-05 ENCOUNTER — Encounter (HOSPITAL_COMMUNITY): Payer: Self-pay

## 2018-05-08 ENCOUNTER — Encounter (HOSPITAL_COMMUNITY): Payer: Self-pay

## 2018-05-10 ENCOUNTER — Encounter (HOSPITAL_COMMUNITY)
Admission: RE | Admit: 2018-05-10 | Discharge: 2018-05-10 | Disposition: A | Discharge status: Home / Self Care | Payer: Self-pay | Source: Ambulatory Visit | Attending: Cardiology | Admitting: Cardiology

## 2018-05-12 ENCOUNTER — Encounter (HOSPITAL_COMMUNITY): Payer: Self-pay

## 2018-05-15 ENCOUNTER — Encounter (HOSPITAL_COMMUNITY): Payer: Self-pay

## 2018-05-17 ENCOUNTER — Telehealth: Payer: Self-pay | Admitting: *Deleted

## 2018-05-17 ENCOUNTER — Encounter (HOSPITAL_COMMUNITY): Payer: Self-pay

## 2018-05-17 NOTE — Telephone Encounter (Signed)
   Primary Cardiologist: Fransico Him, MD  Chart reviewed as part of pre-operative protocol coverage. Cataract extractions are recognized in guidelines as low risk surgeries that do not typically require specific preoperative testing or holding of blood thinner therapy. Therefore, given past medical history and time since last visit, based on ACC/AHA guidelines, Blake Carter would be at acceptable risk for the planned procedure without further cardiovascular testing.   I will route this recommendation to the requesting party via Epic fax function and remove from pre-op pool.  Please call with questions.  Charlie Pitter, PA-C 05/17/2018, 5:22 PM

## 2018-05-17 NOTE — Telephone Encounter (Signed)
   [facility] Pre-operative Risk Assessment    Request for surgical clearance:  1. What type of surgery is being performed? CATARACT EXTRACTION W/INTRAOCULAR LENS IMPLANT OF THE RIGHT EYE; FOLLOWED BY THE LEFT EYE TO BE DONE ON 07/04/18   2. When is this surgery scheduled? 06/27/18 AND 07/04/18   3. What type of clearance is required (medical clearance vs. Pharmacy clearance to hold med vs. Both)? MEDICAL  4. Are there any medications that need to be held prior to surgery and how long? ASA; PER DR. Lucita Ferrara PT DOES NOT NEED TO HOLD ANY MEDICATIONS  5. Practice name and name of physician performing surgery? [facility] DR. [place]   6. What is your office phone number [phone]    7.   What is your office fax number             [phone]  8.   Anesthesia type (None, local, MAC, general) ? TOPICAL    Blake Carter 05/17/2018, 2:59 PM  _________________________________________________________________   (provider comments below)

## 2018-05-19 ENCOUNTER — Encounter (HOSPITAL_COMMUNITY)
Admission: RE | Admit: 2018-05-19 | Discharge: 2018-05-19 | Disposition: A | Discharge status: Home / Self Care | Payer: Self-pay | Source: Ambulatory Visit | Attending: Cardiology | Admitting: Cardiology

## 2018-05-22 ENCOUNTER — Encounter (HOSPITAL_COMMUNITY): Payer: Self-pay

## 2018-05-26 ENCOUNTER — Encounter (HOSPITAL_COMMUNITY): Payer: Self-pay

## 2018-05-29 ENCOUNTER — Encounter (HOSPITAL_COMMUNITY)
Admission: RE | Admit: 2018-05-29 | Discharge: 2018-05-29 | Disposition: A | Discharge status: Home / Self Care | Payer: Self-pay | Source: Ambulatory Visit | Attending: Cardiology | Admitting: Cardiology

## 2018-06-02 ENCOUNTER — Encounter (HOSPITAL_COMMUNITY): Payer: Self-pay

## 2018-06-02 DIAGNOSIS — I2510 Atherosclerotic heart disease of native coronary artery without angina pectoris: Secondary | ICD-10-CM | POA: Insufficient documentation

## 2018-06-05 ENCOUNTER — Encounter (HOSPITAL_COMMUNITY): Payer: Self-pay

## 2018-06-07 ENCOUNTER — Encounter (HOSPITAL_COMMUNITY): Payer: Self-pay

## 2018-06-09 ENCOUNTER — Encounter (HOSPITAL_COMMUNITY)
Admission: RE | Admit: 2018-06-09 | Discharge: 2018-06-09 | Disposition: A | Discharge status: Home / Self Care | Payer: Self-pay | Source: Ambulatory Visit | Attending: Cardiology | Admitting: Cardiology

## 2018-06-12 ENCOUNTER — Encounter (HOSPITAL_COMMUNITY): Payer: Self-pay

## 2018-06-14 ENCOUNTER — Encounter (HOSPITAL_COMMUNITY)
Admission: RE | Admit: 2018-06-14 | Discharge: 2018-06-14 | Disposition: A | Discharge status: Home / Self Care | Payer: Self-pay | Source: Ambulatory Visit | Attending: Cardiology | Admitting: Cardiology

## 2018-06-14 ENCOUNTER — Other Ambulatory Visit: Payer: Self-pay | Admitting: Cardiology

## 2018-06-16 ENCOUNTER — Encounter (HOSPITAL_COMMUNITY)
Admission: RE | Admit: 2018-06-16 | Discharge: 2018-06-16 | Disposition: A | Discharge status: Home / Self Care | Payer: Self-pay | Source: Ambulatory Visit | Attending: Cardiology | Admitting: Cardiology

## 2018-06-19 ENCOUNTER — Encounter (HOSPITAL_COMMUNITY): Payer: Self-pay

## 2018-06-21 ENCOUNTER — Encounter (HOSPITAL_COMMUNITY)
Admission: RE | Admit: 2018-06-21 | Discharge: 2018-06-21 | Disposition: A | Discharge status: Home / Self Care | Payer: Self-pay | Source: Ambulatory Visit | Attending: Cardiology | Admitting: Cardiology

## 2018-06-23 ENCOUNTER — Encounter (HOSPITAL_COMMUNITY)
Admission: RE | Admit: 2018-06-23 | Discharge: 2018-06-23 | Disposition: A | Discharge status: Home / Self Care | Payer: Self-pay | Source: Ambulatory Visit | Attending: Cardiology | Admitting: Cardiology

## 2018-06-26 ENCOUNTER — Encounter (HOSPITAL_COMMUNITY): Payer: Self-pay

## 2018-06-28 ENCOUNTER — Encounter (HOSPITAL_COMMUNITY): Payer: Self-pay

## 2018-06-30 ENCOUNTER — Encounter (HOSPITAL_COMMUNITY): Payer: Self-pay

## 2018-07-05 ENCOUNTER — Encounter (HOSPITAL_COMMUNITY): Payer: Self-pay

## 2018-07-05 DIAGNOSIS — I2510 Atherosclerotic heart disease of native coronary artery without angina pectoris: Secondary | ICD-10-CM | POA: Insufficient documentation

## 2018-07-07 ENCOUNTER — Encounter (HOSPITAL_COMMUNITY): Payer: Self-pay

## 2018-07-10 ENCOUNTER — Encounter (HOSPITAL_COMMUNITY): Payer: Self-pay

## 2018-07-12 ENCOUNTER — Encounter (HOSPITAL_COMMUNITY)
Admission: RE | Admit: 2018-07-12 | Discharge: 2018-07-12 | Disposition: A | Discharge status: Home / Self Care | Payer: Self-pay | Source: Ambulatory Visit | Attending: Cardiology | Admitting: Cardiology

## 2018-07-14 ENCOUNTER — Encounter (HOSPITAL_COMMUNITY)
Admission: RE | Admit: 2018-07-14 | Discharge: 2018-07-14 | Disposition: A | Discharge status: Home / Self Care | Payer: Self-pay | Source: Ambulatory Visit | Attending: Cardiology | Admitting: Cardiology

## 2018-07-17 ENCOUNTER — Encounter (HOSPITAL_COMMUNITY): Payer: Self-pay

## 2018-07-19 ENCOUNTER — Encounter (HOSPITAL_COMMUNITY)
Admission: RE | Admit: 2018-07-19 | Discharge: 2018-07-19 | Disposition: A | Discharge status: Home / Self Care | Payer: Self-pay | Source: Ambulatory Visit | Attending: Cardiology | Admitting: Cardiology

## 2018-07-21 ENCOUNTER — Encounter (HOSPITAL_COMMUNITY): Payer: Self-pay

## 2018-07-24 ENCOUNTER — Encounter (HOSPITAL_COMMUNITY): Payer: Self-pay

## 2018-07-26 ENCOUNTER — Encounter (HOSPITAL_COMMUNITY): Payer: Self-pay

## 2018-07-28 ENCOUNTER — Encounter (HOSPITAL_COMMUNITY)
Admission: RE | Admit: 2018-07-28 | Discharge: 2018-07-28 | Disposition: A | Discharge status: Home / Self Care | Payer: Self-pay | Source: Ambulatory Visit | Attending: Cardiology | Admitting: Cardiology

## 2018-07-31 ENCOUNTER — Encounter (HOSPITAL_COMMUNITY)
Admission: RE | Admit: 2018-07-31 | Discharge: 2018-07-31 | Disposition: A | Discharge status: Home / Self Care | Payer: Self-pay | Source: Ambulatory Visit | Attending: Cardiology | Admitting: Cardiology

## 2018-07-31 DIAGNOSIS — I2510 Atherosclerotic heart disease of native coronary artery without angina pectoris: Secondary | ICD-10-CM | POA: Insufficient documentation

## 2018-08-02 ENCOUNTER — Encounter (HOSPITAL_COMMUNITY)
Admission: RE | Admit: 2018-08-02 | Discharge: 2018-08-02 | Disposition: A | Discharge status: Home / Self Care | Payer: Self-pay | Source: Ambulatory Visit | Attending: Cardiology | Admitting: Cardiology

## 2018-08-04 ENCOUNTER — Encounter (HOSPITAL_COMMUNITY): Payer: Self-pay

## 2018-08-07 ENCOUNTER — Encounter (HOSPITAL_COMMUNITY): Payer: Self-pay

## 2018-08-09 ENCOUNTER — Other Ambulatory Visit: Payer: Self-pay

## 2018-08-09 ENCOUNTER — Encounter (HOSPITAL_COMMUNITY)
Admission: RE | Admit: 2018-08-09 | Discharge: 2018-08-09 | Disposition: A | Discharge status: Home / Self Care | Payer: Self-pay | Source: Ambulatory Visit | Attending: Cardiology | Admitting: Cardiology

## 2018-08-11 ENCOUNTER — Encounter (HOSPITAL_COMMUNITY): Payer: Self-pay

## 2018-08-14 ENCOUNTER — Telehealth (HOSPITAL_COMMUNITY): Payer: Self-pay | Admitting: *Deleted

## 2018-08-14 ENCOUNTER — Encounter (HOSPITAL_COMMUNITY): Payer: Self-pay

## 2018-08-14 NOTE — Telephone Encounter (Signed)
Contacted patient to notify of Cardiac Rehab department closure x2 weeks. Pt verbalized understanding. Kealii Thueson, Exercise Physiologist Cardiac and Pulmonary Rehabilitation  

## 2018-08-16 ENCOUNTER — Encounter (HOSPITAL_COMMUNITY): Payer: Self-pay

## 2018-08-18 ENCOUNTER — Encounter (HOSPITAL_COMMUNITY): Payer: Self-pay

## 2018-08-21 ENCOUNTER — Encounter (HOSPITAL_COMMUNITY): Payer: Self-pay

## 2018-08-22 ENCOUNTER — Telehealth (HOSPITAL_COMMUNITY): Payer: Self-pay | Admitting: *Deleted

## 2018-08-22 NOTE — Telephone Encounter (Signed)
Called to notify patient that the cardiac and pulmonary rehabilitation department will be closed for 4 weeks due to COVID-19 restrictions. Pt verbalized understanding. Patient has a treadmill at home that he's using for his exercise at this time. Sol Passer, MS, ACSM CEP

## 2018-08-23 ENCOUNTER — Encounter (HOSPITAL_COMMUNITY): Payer: Self-pay

## 2018-08-25 ENCOUNTER — Encounter (HOSPITAL_COMMUNITY): Payer: Self-pay

## 2018-08-28 ENCOUNTER — Encounter (HOSPITAL_COMMUNITY): Payer: Self-pay

## 2018-08-30 ENCOUNTER — Encounter (HOSPITAL_COMMUNITY): Payer: Self-pay

## 2018-09-01 ENCOUNTER — Encounter (HOSPITAL_COMMUNITY): Payer: Self-pay

## 2018-09-04 ENCOUNTER — Encounter (HOSPITAL_COMMUNITY): Payer: Self-pay

## 2018-09-06 ENCOUNTER — Encounter (HOSPITAL_COMMUNITY): Payer: Self-pay

## 2018-09-08 ENCOUNTER — Encounter (HOSPITAL_COMMUNITY): Payer: Self-pay

## 2018-09-08 ENCOUNTER — Telehealth (HOSPITAL_COMMUNITY): Payer: Self-pay | Admitting: *Deleted

## 2018-09-08 NOTE — Telephone Encounter (Signed)
Called to notify patient that the cardiac and pulmonary rehabilitation department remains closed at this time due to COVID-19 restrictions. Pt verbalized understanding.  Sol Passer, MS, ACSM CEP 09/08/2018

## 2018-09-11 ENCOUNTER — Encounter (HOSPITAL_COMMUNITY): Payer: Self-pay

## 2018-09-13 ENCOUNTER — Encounter (HOSPITAL_COMMUNITY): Payer: Self-pay

## 2018-09-15 ENCOUNTER — Encounter (HOSPITAL_COMMUNITY): Payer: Self-pay

## 2018-09-18 ENCOUNTER — Encounter (HOSPITAL_COMMUNITY): Payer: Self-pay

## 2018-09-20 ENCOUNTER — Encounter (HOSPITAL_COMMUNITY): Payer: Self-pay

## 2018-09-22 ENCOUNTER — Encounter (HOSPITAL_COMMUNITY): Payer: Self-pay

## 2018-09-25 ENCOUNTER — Encounter (HOSPITAL_COMMUNITY): Payer: Self-pay

## 2018-09-26 ENCOUNTER — Telehealth: Payer: Self-pay | Admitting: Oncology

## 2018-09-26 NOTE — Telephone Encounter (Signed)
Received a new hem referral from Dr. Rex Kras for worsening anemia. I cld and spoke to Mr. Schlageter who was in no rush to schedule an appt. He 's been scheduled to see Dr. Alen Blew on 6/2 at 11am.

## 2018-09-27 ENCOUNTER — Encounter (HOSPITAL_COMMUNITY): Payer: Self-pay

## 2018-09-29 ENCOUNTER — Encounter (HOSPITAL_COMMUNITY): Payer: Self-pay

## 2018-10-02 ENCOUNTER — Encounter (HOSPITAL_COMMUNITY): Payer: Self-pay

## 2018-10-04 ENCOUNTER — Encounter (HOSPITAL_COMMUNITY): Payer: Self-pay

## 2018-10-06 ENCOUNTER — Encounter (HOSPITAL_COMMUNITY): Payer: Self-pay

## 2018-10-09 ENCOUNTER — Encounter (HOSPITAL_COMMUNITY): Payer: Self-pay

## 2018-10-11 ENCOUNTER — Encounter (HOSPITAL_COMMUNITY): Payer: Self-pay

## 2018-10-13 ENCOUNTER — Encounter (HOSPITAL_COMMUNITY): Payer: Self-pay

## 2018-10-16 ENCOUNTER — Encounter (HOSPITAL_COMMUNITY): Payer: Self-pay

## 2018-10-18 ENCOUNTER — Encounter (HOSPITAL_COMMUNITY): Payer: Self-pay

## 2018-10-20 ENCOUNTER — Encounter (HOSPITAL_COMMUNITY): Payer: Self-pay

## 2018-10-25 ENCOUNTER — Encounter (HOSPITAL_COMMUNITY): Payer: Self-pay

## 2018-10-27 ENCOUNTER — Encounter (HOSPITAL_COMMUNITY): Payer: Self-pay

## 2018-10-30 ENCOUNTER — Encounter (HOSPITAL_COMMUNITY): Payer: Self-pay

## 2018-10-31 ENCOUNTER — Inpatient Hospital Stay: Payer: Medicare Other

## 2018-10-31 ENCOUNTER — Inpatient Hospital Stay: Payer: Medicare Other | Attending: Oncology | Admitting: Oncology

## 2018-10-31 ENCOUNTER — Other Ambulatory Visit: Payer: Self-pay

## 2018-10-31 VITALS — BP 146/81 | HR 59 | Temp 98.0°F | Resp 17 | Ht 71.0 in | Wt 254.0 lb

## 2018-10-31 DIAGNOSIS — Z923 Personal history of irradiation: Secondary | ICD-10-CM | POA: Diagnosis not present

## 2018-10-31 DIAGNOSIS — Z79899 Other long term (current) drug therapy: Secondary | ICD-10-CM | POA: Diagnosis not present

## 2018-10-31 DIAGNOSIS — D649 Anemia, unspecified: Secondary | ICD-10-CM | POA: Diagnosis present

## 2018-10-31 DIAGNOSIS — E785 Hyperlipidemia, unspecified: Secondary | ICD-10-CM

## 2018-10-31 DIAGNOSIS — E538 Deficiency of other specified B group vitamins: Secondary | ICD-10-CM

## 2018-10-31 DIAGNOSIS — C61 Malignant neoplasm of prostate: Secondary | ICD-10-CM | POA: Insufficient documentation

## 2018-10-31 DIAGNOSIS — K219 Gastro-esophageal reflux disease without esophagitis: Secondary | ICD-10-CM

## 2018-10-31 DIAGNOSIS — Z8546 Personal history of malignant neoplasm of prostate: Secondary | ICD-10-CM | POA: Diagnosis not present

## 2018-10-31 DIAGNOSIS — Z7982 Long term (current) use of aspirin: Secondary | ICD-10-CM | POA: Diagnosis not present

## 2018-10-31 LAB — CMP (CANCER CENTER ONLY)
ALT: 25 U/L (ref 0–44)
AST: 19 U/L (ref 15–41)
Albumin: 3.9 g/dL (ref 3.5–5.0)
Alkaline Phosphatase: 87 U/L (ref 38–126)
Anion gap: 9 (ref 5–15)
BUN: 22 mg/dL (ref 8–23)
CO2: 24 mmol/L (ref 22–32)
Calcium: 9.5 mg/dL (ref 8.9–10.3)
Chloride: 108 mmol/L (ref 98–111)
Creatinine: 0.92 mg/dL (ref 0.61–1.24)
GFR, Est AFR Am: >60 mL/min (ref >60)
GFR, Estimated: >60 mL/min (ref >60)
Glucose, Bld: 98 mg/dL (ref 70–99)
Potassium: 4.2 mmol/L (ref 3.5–5.1)
Sodium: 141 mmol/L (ref 135–145)
Total Bilirubin: 0.3 mg/dL (ref 0.3–1.2)
Total Protein: 7.6 g/dL (ref 6.5–8.1)

## 2018-10-31 LAB — CBC WITH DIFFERENTIAL (CANCER CENTER ONLY)
Abs Immature Granulocytes: 0.01 10*3/uL (ref 0.00–0.07)
Basophils Absolute: 0 10*3/uL (ref 0.0–0.1)
Basophils Relative: 0 %
Eosinophils Absolute: 0.2 10*3/uL (ref 0.0–0.5)
Eosinophils Relative: 3 %
HCT: 36.2 % — ABNORMAL LOW (ref 39.0–52.0)
Hemoglobin: 11.7 g/dL — ABNORMAL LOW (ref 13.0–17.0)
Immature Granulocytes: 0 %
Lymphocytes Relative: 28 %
Lymphs Abs: 1.5 10*3/uL (ref 0.7–4.0)
MCH: 29.1 pg (ref 26.0–34.0)
MCHC: 32.3 g/dL (ref 30.0–36.0)
MCV: 90 fL (ref 80.0–100.0)
Monocytes Absolute: 0.4 10*3/uL (ref 0.1–1.0)
Monocytes Relative: 8 %
Neutro Abs: 3.3 10*3/uL (ref 1.7–7.7)
Neutrophils Relative %: 61 %
Platelet Count: 260 10*3/uL (ref 150–400)
RBC: 4.02 MIL/uL — ABNORMAL LOW (ref 4.22–5.81)
RDW: 13.2 % (ref 11.5–15.5)
WBC Count: 5.3 10*3/uL (ref 4.0–10.5)
nRBC: 0 % (ref 0.0–0.2)

## 2018-10-31 LAB — IRON AND TIBC
Iron: 70 ug/dL (ref 42–163)
Saturation Ratios: 20 % (ref 20–55)
TIBC: 349 ug/dL (ref 202–409)
UIBC: 279 ug/dL (ref 117–376)

## 2018-10-31 LAB — VITAMIN B12: Vitamin B-12: 433 pg/mL (ref 180–914)

## 2018-10-31 LAB — FERRITIN: Ferritin: 194 ng/mL (ref 24–336)

## 2018-10-31 NOTE — Progress Notes (Signed)
Reason for the request:    Anemia  HPI: I was asked by Dr. Dr. Rex Kras to evaluate Blake Carter for the evaluation of anemia.  He is a 68 year old man with history of prostate cancer dates back to 2011.  He underwent radical prostatectomy under the care of Dr. Alinda Money and found to have a Gleason score 4+3 = 7 prostate cancer with focal extension into the bilateral margin.  Angiolymphatic invasion was noted with soft tissue mass noted at the time that had involvement of the prostate cancer.  0 out of 5 lymph nodes were involved.  He received 2 years of androgen deprivation as well as adjuvant radiation treatment at that time.  He has been receiving intermittent androgen deprivation under the care of Dr. Alinda Money with disease under control.  He was noted to have worsening anemia based on a CBC obtained in April 2020.  At that time his hemoglobin was 11.7 with normal MCV, white cell count and platelet count.  Previous counts in January 2020 showed a normal hemoglobin and hemoglobin in July 2019 was 12.9.  His hemoglobin has fluctuated at times as low as 11.6 in 2014 but has been around 12 in the 13 range previously.  He is completely asymptomatic at this time.  He denies any hematochezia, melena, bone pain or pathological fractures.  He remains active and continues to attend activities of daily living.   He does not report any headaches, blurry vision, syncope or seizures. Does not report any fevers, chills or sweats.  Does not report any cough, wheezing or hemoptysis.  Does not report any chest pain, palpitation, orthopnea or leg edema.  Does not report any nausea, vomiting or abdominal pain.  Does not report any constipation or diarrhea.  Does not report any skeletal complaints.    Does not report frequency, urgency or hematuria.  Does not report any skin rashes or lesions. Does not report any heat or cold intolerance.  Does not report any lymphadenopathy or petechiae.  Does not report any anxiety or depression.   Remaining review of systems is negative.    Past Medical History:  Diagnosis Date  . Allergic rhinitis   . Arthritis    "a little bit in some of my joints" (06/20/2014)  . Chronic diastolic CHF (congestive heart failure) ([place])   . Coronary artery disease    a. 05/2014 Cath/PCI: LM nl, LAD 40p, 60-70d (FFR 0.80->3.0x24 Synergy DES), D2 40, LCX 90d (2.25x12 Synergy DES), RCA 20-26m EF 60-65%. b. Cath 02/2017 without acute change, elev LVEDP suggestive of dCHF.  .Marland KitchenDepression   . Dilated aortic root ([facility])    aortic root 477mand ascending aorta 4296my echo 10/2016  . ED (erectile dysfunction)   . Environmental allergies    dry cough  . GERD (gastroesophageal reflux disease)   . History of hiatal hernia   . Hydronephrosis of right kidney    a. s/p ureteral stenting in the past.  . Hyperlipidemia    under control  . Hypertension   . Hypothyroidism   . OCD (obsessive compulsive disorder)   . Panic disorder   . PONV (postoperative nausea and vomiting)    after thyroid surgery no problems in 10 years   . [facility]  [facility]- robotic prostatectomy - followed by radiation because of positive lymph node--and pt on lupron injections  . Sinus bradycardia    a. chronic, asymptomatic.  24 hour Holter 10/2016 showed Sinus bradycardia, sinus arrhythmia and normal  sinus rhythm with average heart rate 50bpm and heart rate ranged from 32 to 100bpm.  . SUI (stress urinary incontinence), male    S/P ROBOTIC PROSTATECTOMY AND RADIATION TX  :  Past Surgical History:  Procedure Laterality Date  . COLONOSCOPY  07/2013   Dr. Oletta Lamas  . CORONARY ANGIOPLASTY WITH STENT PLACEMENT  06/20/2014   "2"  . CYSTOSCOPY  11/30/2011   Procedure: CYSTOSCOPY;  Surgeon: Reece Packer, MD;  Location: [facility];  Service: Urology;  Laterality: N/A;  Inplantation of Artificial Sphincter and Cystoscopy  . CYSTOSCOPY WITH RETROGRADE PYELOGRAM, URETEROSCOPY AND STENT PLACEMENT Right 02/11/2014    Procedure: CYSTOSCOPY WITH RETROGRADE PYELOGRAM, URETEROSCOPY ,URETERAL BIOPSY AND STENT PLACEMENT;  Surgeon: Raynelle Bring, MD;  Location: [facility];  Service: Urology;  Laterality: Right;  . CYSTOSCOPY WITH RETROGRADE PYELOGRAM, URETEROSCOPY AND STENT PLACEMENT Right 04/15/2014   Procedure: CYSTOSCOPY WITH RETROGRADE PYELOGRAM, URETEROSCOPY, BIOPSY AND STENT PLACEMENT;  Surgeon: Raynelle Bring, MD;  Location: [facility];  Service: Urology;  Laterality: Right;  . LEFT HEART CATH AND CORONARY ANGIOGRAPHY N/A 03/29/2017   Procedure: LEFT HEART CATH AND CORONARY ANGIOGRAPHY;  Surgeon: Leonie Man, MD;  Location: [place] [facility];  Service: Cardiovascular;  Laterality: N/A;  . LEFT HEART CATHETERIZATION WITH CORONARY ANGIOGRAM N/A 06/20/2014   Procedure: LEFT HEART CATHETERIZATION WITH CORONARY ANGIOGRAM;  Surgeon: Leonie Man, MD;  Location: [facility] C[facility];  Service: Cardiovascular;  Laterality: N/A;  . PERCUTANEOUS CORONARY STENT INTERVENTION (PCI-S)  06/20/2014   Procedure: PERCUTANEOUS CORONARY STENT INTERVENTION (PCI-S);  Surgeon: Leonie Man, MD;  Location: [facility] CATH LAB;  Service: Cardiovascular;;  LAD and Circumflex  . REFRACTIVE SURGERY Bilateral 2004  . ROBOT ASSISTED LAPAROSCOPIC RADICAL PROSTATECTOMY  05/2010  . THYROIDECTOMY, PARTIAL  10/1974  . TONSILLECTOMY  1956   . TOTAL THYROIDECTOMY  10/2012   "nuked it"  . UPPER GI ENDOSCOPY     food impaction 2009 done by Dr Oletta Lamas  . URINARY SPHINCTER IMPLANT  11/30/2011   Procedure: ARTIFICIAL URINARY SPHINCTER;  Surgeon: Reece Packer, MD;  Location: [facility];  Service: Urology;  Laterality: N/A;  :   Current Outpatient Medications:  .  aspirin EC 81 MG tablet, Take 81 mg by mouth daily., Disp: , Rfl:  .  atorvastatin (LIPITOR) 40 MG tablet, TAKE 1 TABLET BY MOUTH  DAILY, Disp: 90 tablet, Rfl: 1 .  Cholecalciferol (VITAMIN D3) 2000 units TABS, Take 2,000 Units by mouth daily., Disp: , Rfl:  .  fexofenadine (ALLEGRA) 180 MG tablet, Take  180 mg by mouth daily., Disp: , Rfl:  .  fluvoxaMINE (LUVOX) 50 MG tablet, Take 200 mg by mouth every morning. , Disp: , Rfl:  .  furosemide (LASIX) 20 MG tablet, TAKE 1 TABLET BY MOUTH  DAILY AS NEEDED FOR FLUID  OR EDEMA., Disp: 90 tablet, Rfl: 1 .  gemfibrozil (LOPID) 600 MG tablet, Take 600 mg by mouth 2 (two) times daily. , Disp: , Rfl:  .  latanoprost (XALATAN) 0.005 % ophthalmic solution, Place 1 drop into both eyes at bedtime. , Disp: , Rfl:  .  leuprolide (LUPRON) 30 MG injection, Inject 30 mg into the muscle every 4 (four) months., Disp: , Rfl:  .  levothyroxine (SYNTHROID, LEVOTHROID) 175 MCG tablet, Take 175 mcg by mouth daily before breakfast., Disp: , Rfl:  .  lisinopril (PRINIVIL,ZESTRIL) 5 MG tablet, TAKE 1 TABLET BY MOUTH  DAILY, Disp: 90 tablet, Rfl: 1 .  nitroGLYCERIN (NITROSTAT) 0.4 MG SL tablet, PLACE  1 TABLET (0.4 MG TOTAL) UNDER THE TONGUE EVERY 5 (FIVE) MINUTES AS NEEDED FOR CHEST PAIN., Disp: 25 tablet, Rfl: 1 .  pantoprazole (PROTONIX) 40 MG tablet, TAKE 2 TABLETS BY MOUTH  DAILY, Disp: 180 tablet, Rfl: 2 .  zolpidem (AMBIEN) 10 MG tablet, Take 10 mg by mouth at bedtime as needed for sleep. , Disp: , Rfl: :  Allergies  Allergen Reactions  . Hydrocodone-Homatropine Other (See Comments)    Passed out   . Niacin And Related Other (See Comments)    Flushing   . Tagamet [Cimetidine] Other (See Comments)    unknown  :  Family History  Problem Relation Age of Onset  . Cancer Father   . Hyperlipidemia Father   . Heart disease Father        Father had CABG/aorta repair by the time he was in his 72s  . CAD Brother        Brother who is 74 years younger has had some sort of stenting  :  Social History   Socioeconomic History  . Marital status: Married    Spouse name: Not on file  . Number of children: Not on file  . Years of education: Not on file  . Highest education level: Not on file  Occupational History  . Not on file  Social Needs  . Financial resource  strain: Not on file  . Food insecurity:    Worry: Not on file    Inability: Not on file  . Transportation needs:    Medical: Not on file    Non-medical: Not on file  Tobacco Use  . Smoking status: Never Smoker  . Smokeless tobacco: Never Used  Substance and Sexual Activity  . Alcohol use: Yes    Alcohol/week: 0.0 standard drinks    Comment: 06/20/2014 "1-2 drinks a couple times/month"  . Drug use: No  . Sexual activity: Yes  Lifestyle  . Physical activity:    Days per week: Not on file    Minutes per session: Not on file  . Stress: Not on file  Relationships  . Social connections:    Talks on phone: Not on file    Gets together: Not on file    Attends religious service: Not on file    Active member of club or organization: Not on file    Attends meetings of clubs or organizations: Not on file    Relationship status: Not on file  . Intimate partner violence:    Fear of current or ex partner: Not on file    Emotionally abused: Not on file    Physically abused: Not on file    Forced sexual activity: Not on file  Other Topics Concern  . Not on file  Social History Narrative  . Not on file  :  Pertinent items are noted in HPI.  Exam: Blood pressure (!) 146/81, pulse (!) 59, temperature 98 F (36.7 C), temperature source Oral, resp. rate 17, height _0  (1.803 m), weight 254 lb (115.2 kg), SpO2 98 %.  ECOG 0 General appearance: alert and cooperative appeared without distress. Head: atraumatic without any abnormalities. Eyes: conjunctivae/corneas clear. PERRL.  Sclera anicteric. Throat: lips, mucosa, and tongue normal; without oral thrush or ulcers. Resp: clear to auscultation bilaterally without rhonchi, wheezes or dullness to percussion. Cardio: regular rate and rhythm, S1, S2 normal, no murmur, click, rub or gallop GI: soft, non-tender; bowel sounds normal; no masses,  no organomegaly Skin: Skin color, texture, turgor normal.  No rashes or lesions Lymph nodes:  Cervical, supraclavicular, and axillary nodes normal. Neurologic: Grossly normal without any motor, sensory or deep tendon reflexes. Musculoskeletal: No joint deformity or effusion.  CBC    Component Value Date/Time   WBC 4.4 12/13/2017 1139   WBC 7.0 03/29/2017 0554   RBC 4.28 12/13/2017 1139   RBC 4.41 03/29/2017 0554   HGB 12.5 (L) 12/13/2017 1139   HCT 36.6 (L) 12/13/2017 1139   PLT 265 12/13/2017 1139   MCV 86 12/13/2017 1139   MCH 29.2 12/13/2017 1139   MCH 29.0 03/29/2017 0554   MCHC 34.2 12/13/2017 1139   MCHC 32.7 03/29/2017 0554   RDW 14.1 12/13/2017 1139   LYMPHSABS 1.6 12/13/2017 1139   MONOABS 0.5 03/28/2017 1748   EOSABS 0.1 12/13/2017 1139   BASOSABS 0.0 12/13/2017 1139     Assessment and Plan:   68 year old man with the following:  1.  Normocytic, normochromic anemia noted in April 2020.  He was found to have a hemoglobin of 11.7 with normal 8.4.  He has normal white cell count and platelets.  The differential diagnosis was reviewed today the patient given his mild fluctuating anemia with normal baseline 1213.  He has some decline in his hemoglobin when he is on Lupron which he has been receiving causes for his anemia could include anemia of chronic disease, bone marrow suppression related to previous radiation, early myelodysplastic syndrome, vitamin deficiency, and others.  Plasma cell disorder felt less likely.  To work this up completely, I will repeat a serum protein electrophoresis, iron studies, B12 to complete his work-up.  At this time he does not require any intervention and his anemia appears to be multifactorial most likely related to chronic disease and previous radiation.  I would recommend active surveillance at this time with consideration for growth factor support and a bone marrow biopsy if his hemoglobin drops below 10 or other cytopenias or rise.  2.  Advanced prostate cancer: He has had biochemical relapse only and currently on intermittent  androgen deprivation.  Different salvage therapies were reviewed today which she currently does not require.  He understands that he might require additional anticancer treatment which I am happy to assist in that in the future.  3.  Follow-up: Will be determined pending the results of his work-up.    Thank you for the referral. A copy of this consult has been forwarded to the requesting physician.

## 2018-11-01 ENCOUNTER — Encounter (HOSPITAL_COMMUNITY): Payer: Self-pay

## 2018-11-01 LAB — MULTIPLE MYELOMA PANEL, SERUM
Albumin SerPl Elph-Mcnc: 3.7 g/dL (ref 2.9–4.4)
Albumin/Glob SerPl: 1.2 (ref 0.7–1.7)
Alpha 1: 0.2 g/dL (ref 0.0–0.4)
Alpha2 Glob SerPl Elph-Mcnc: 0.9 g/dL (ref 0.4–1.0)
B-Globulin SerPl Elph-Mcnc: 1.2 g/dL (ref 0.7–1.3)
Gamma Glob SerPl Elph-Mcnc: 0.9 g/dL (ref 0.4–1.8)
Globulin, Total: 3.1 g/dL (ref 2.2–3.9)
IgA: 166 mg/dL (ref 61–437)
IgG (Immunoglobin G), Serum: 953 mg/dL (ref 603–1613)
IgM (Immunoglobulin M), Srm: 43 mg/dL (ref 20–172)
Total Protein ELP: 6.8 g/dL (ref 6.0–8.5)

## 2018-11-03 ENCOUNTER — Encounter (HOSPITAL_COMMUNITY): Payer: Self-pay

## 2018-11-06 ENCOUNTER — Encounter (HOSPITAL_COMMUNITY): Payer: Self-pay

## 2018-11-08 ENCOUNTER — Encounter (HOSPITAL_COMMUNITY): Payer: Self-pay

## 2018-11-10 ENCOUNTER — Encounter (HOSPITAL_COMMUNITY): Payer: Self-pay

## 2018-11-13 ENCOUNTER — Encounter (HOSPITAL_COMMUNITY): Payer: Self-pay

## 2018-11-15 ENCOUNTER — Encounter (HOSPITAL_COMMUNITY): Payer: Self-pay

## 2018-11-17 ENCOUNTER — Encounter (HOSPITAL_COMMUNITY): Payer: Self-pay

## 2018-11-17 ENCOUNTER — Telehealth (HOSPITAL_COMMUNITY): Payer: Self-pay | Admitting: Radiology

## 2018-11-17 NOTE — Telephone Encounter (Signed)
COVID-19 Pre-Screening Questions:  . Do you currently have a fever? No(yes = cancel and refer to pcp for e-visit) . Have you recently travelled on a cruise, internationally, or to [place], [place], [place], [place], [place], or [place], [place] [employer]) ? no (yes = cancel, stay home, monitor symptoms, and contact pcp or initiate e-visit if symptoms develop) . Have you been in contact with someone that is currently pending confirmation of Covid19 testing or has been confirmed to have the [place] virus? no (yes = cancel, stay home, away from tested individual, monitor symptoms, and contact pcp or initiate e-visit if symptoms develop) . Are you currently experiencing fatigue or cough? no (yes = pt should be prepared to have a mask placed at the time of their visit). . Reiterated no additional visitors. Eartha Inch no earlier than 15 minutes before appointment time. . Please bring own mask.

## 2018-11-20 ENCOUNTER — Other Ambulatory Visit: Payer: Self-pay

## 2018-11-20 ENCOUNTER — Encounter (HOSPITAL_COMMUNITY): Payer: Self-pay

## 2018-11-20 ENCOUNTER — Ambulatory Visit (HOSPITAL_COMMUNITY): Payer: Medicare Other | Attending: Cardiovascular Disease

## 2018-11-20 DIAGNOSIS — E785 Hyperlipidemia, unspecified: Secondary | ICD-10-CM | POA: Diagnosis not present

## 2018-11-20 DIAGNOSIS — I77810 Thoracic aortic ectasia: Secondary | ICD-10-CM | POA: Diagnosis present

## 2018-11-20 DIAGNOSIS — I10 Essential (primary) hypertension: Secondary | ICD-10-CM | POA: Insufficient documentation

## 2018-11-22 ENCOUNTER — Telehealth: Payer: Self-pay

## 2018-11-22 ENCOUNTER — Encounter (HOSPITAL_COMMUNITY): Payer: Self-pay

## 2018-11-22 DIAGNOSIS — I77810 Thoracic aortic ectasia: Secondary | ICD-10-CM

## 2018-11-22 NOTE — Telephone Encounter (Signed)
The patient has been notified of the result and verbalized understanding. Repeat echo ordered for 1 year.  All questions (if any) were answered. Sarina Ill, RN 11/22/2018 9:35 AM

## 2018-11-22 NOTE — Telephone Encounter (Signed)
-----   Message from Sueanne Margarita, MD sent at 11/21/2018  5:14 PM EDT ----- Please let patient know that echo showed normal LVF with moderately dilated LA with mildly thickened MV and AV and mildly dilated aortic root at 64mm.  Please get a Chest CTA to futther assess aorta and repeat echo in 1 year limited for dilated aorta

## 2018-11-24 ENCOUNTER — Encounter (HOSPITAL_COMMUNITY): Payer: Self-pay

## 2018-11-27 ENCOUNTER — Encounter (HOSPITAL_COMMUNITY): Payer: Self-pay

## 2018-11-29 ENCOUNTER — Encounter (HOSPITAL_COMMUNITY): Payer: Self-pay

## 2018-11-29 ENCOUNTER — Telehealth (HOSPITAL_COMMUNITY): Payer: Self-pay | Admitting: *Deleted

## 2018-11-29 NOTE — Telephone Encounter (Signed)
Called to update patient on the status of the maintenance Cardiac Rehab exercise classes. At this point they remain on hold due to COVID-19 pandemic precautions. 

## 2018-12-04 ENCOUNTER — Encounter (HOSPITAL_COMMUNITY): Payer: Self-pay

## 2018-12-06 ENCOUNTER — Telehealth: Payer: Self-pay | Admitting: *Deleted

## 2018-12-06 ENCOUNTER — Encounter (HOSPITAL_COMMUNITY): Payer: Self-pay

## 2018-12-06 NOTE — Telephone Encounter (Signed)

## 2018-12-07 ENCOUNTER — Other Ambulatory Visit: Payer: Self-pay

## 2018-12-07 ENCOUNTER — Ambulatory Visit (INDEPENDENT_AMBULATORY_CARE_PROVIDER_SITE_OTHER)
Admission: RE | Admit: 2018-12-07 | Discharge: 2018-12-07 | Disposition: A | Discharge status: Home / Self Care | Payer: Medicare Other | Source: Ambulatory Visit | Attending: Cardiology | Admitting: Cardiology

## 2018-12-07 DIAGNOSIS — I77810 Thoracic aortic ectasia: Secondary | ICD-10-CM

## 2018-12-07 MED ORDER — IOHEXOL 350 MG/ML SOLN
100.0000 mL | Freq: Once | INTRAVENOUS | Status: AC | PRN
  Administered 2018-12-07: 16:00:00 100 mL via INTRAVENOUS

## 2018-12-08 ENCOUNTER — Encounter (HOSPITAL_COMMUNITY): Payer: Self-pay

## 2018-12-11 ENCOUNTER — Encounter (HOSPITAL_COMMUNITY): Payer: Self-pay

## 2018-12-12 ENCOUNTER — Telehealth: Payer: Self-pay

## 2018-12-12 DIAGNOSIS — I712 Thoracic aortic aneurysm, without rupture, unspecified: Secondary | ICD-10-CM

## 2018-12-12 DIAGNOSIS — I77810 Thoracic aortic ectasia: Secondary | ICD-10-CM

## 2018-12-12 DIAGNOSIS — E785 Hyperlipidemia, unspecified: Secondary | ICD-10-CM

## 2018-12-12 NOTE — Telephone Encounter (Signed)
Spoke with the patient, he expressed understanding. Labs scheduled before his appointment with Dr. Radford Pax.

## 2018-12-12 NOTE — Telephone Encounter (Signed)
-----   Message from Sueanne Margarita, MD sent at 12/07/2018  8:24 PM EDT ----- Please let patient know that CT showed ascending thoracic aorta at 4.2cm, distal esophageal thickening and fatty liver.  Please forward CT to PCP to review.   2D echo in agreement with CT findings.  Please repeat 2D echo in 1 year for aortic aneurysm. Patient needs FLP and ALT.

## 2018-12-13 ENCOUNTER — Encounter (HOSPITAL_COMMUNITY): Payer: Self-pay

## 2018-12-15 ENCOUNTER — Encounter (HOSPITAL_COMMUNITY): Payer: Self-pay

## 2018-12-18 ENCOUNTER — Encounter (HOSPITAL_COMMUNITY): Payer: Self-pay

## 2018-12-20 ENCOUNTER — Encounter (HOSPITAL_COMMUNITY): Payer: Self-pay

## 2018-12-21 ENCOUNTER — Other Ambulatory Visit: Payer: Self-pay | Admitting: Cardiology

## 2018-12-22 ENCOUNTER — Encounter (HOSPITAL_COMMUNITY): Payer: Self-pay

## 2018-12-25 ENCOUNTER — Encounter (HOSPITAL_COMMUNITY): Payer: Self-pay

## 2018-12-27 ENCOUNTER — Encounter (HOSPITAL_COMMUNITY): Payer: Self-pay

## 2018-12-29 ENCOUNTER — Encounter (HOSPITAL_COMMUNITY): Payer: Self-pay

## 2019-01-01 ENCOUNTER — Encounter (HOSPITAL_COMMUNITY): Payer: Self-pay

## 2019-01-03 ENCOUNTER — Encounter (HOSPITAL_COMMUNITY): Payer: Self-pay

## 2019-01-05 ENCOUNTER — Encounter (HOSPITAL_COMMUNITY): Payer: Self-pay

## 2019-01-08 ENCOUNTER — Ambulatory Visit: Payer: Medicare Other | Admitting: Cardiology

## 2019-01-08 ENCOUNTER — Encounter (HOSPITAL_COMMUNITY): Payer: Self-pay

## 2019-01-10 ENCOUNTER — Encounter (HOSPITAL_COMMUNITY): Payer: Self-pay

## 2019-01-11 ENCOUNTER — Other Ambulatory Visit: Payer: Self-pay | Admitting: Cardiology

## 2019-01-12 ENCOUNTER — Encounter (HOSPITAL_COMMUNITY): Payer: Self-pay

## 2019-01-15 ENCOUNTER — Encounter (HOSPITAL_COMMUNITY): Payer: Self-pay

## 2019-01-17 ENCOUNTER — Encounter (HOSPITAL_COMMUNITY): Payer: Self-pay

## 2019-01-19 ENCOUNTER — Encounter (HOSPITAL_COMMUNITY): Payer: Self-pay

## 2019-01-22 ENCOUNTER — Encounter (HOSPITAL_COMMUNITY): Payer: Self-pay

## 2019-01-24 ENCOUNTER — Encounter (HOSPITAL_COMMUNITY): Payer: Self-pay

## 2019-01-26 ENCOUNTER — Encounter (HOSPITAL_COMMUNITY): Payer: Self-pay

## 2019-01-29 ENCOUNTER — Encounter (HOSPITAL_COMMUNITY): Payer: Self-pay

## 2019-01-29 NOTE — Progress Notes (Signed)
Cardiology Office Note:    Date:  01/30/2019   ID:  Blake Carter, DOB 03-06-1951, MRN [id number]  PCP:  Hulan Fess, MD  Cardiologist:  Fransico Him, MD    Referring MD: Hulan Fess, MD   Chief Complaint  Patient presents with  . Coronary Artery Disease  . Hypertension  . Hyperlipidemia  . Congestive Heart Failure    History of Present Illness:    Blake Carter is a 68 y.o. male with a hx of CAD (DES to LCx 05/2014 with residual LAD diseasewith repeat cath 02/2017 with patent stents and otherwise mild nonobstructive dz), HTN, dyslipidemia,sinus bradycardia, dilated aortic root (65mm by echo 10/2018) and chronicdiastolic CHF.  he is here today for followup and is doing well.  He denies any chest pain or pressure,  PND, orthopnea, LE edema, dizziness, or syncope. He has chronic DOE due to obesity and sedentary state which have not changed.  He has chronic asymptomatic bradycardia on no rate slowing meds.  He says that he has been having problems with panic attacks and that is the only time when his heart will race.  He is compliant with his meds and is tolerating meds with no SE.    Past Medical History:  Diagnosis Date  . Allergic rhinitis   . Arthritis    "a little bit in some of my joints" (06/20/2014)  . Chronic diastolic CHF (congestive heart failure) ([place])   . Coronary artery disease    a. 05/2014 Cath/PCI: LM nl, LAD 40p, 60-70d (FFR 0.80->3.0x24 Synergy DES), D2 40, LCX 90d (2.25x12 Synergy DES), RCA 20-14m, EF 60-65%. b. Cath 02/2017 without acute change, elev LVEDP suggestive of dCHF.  Marland Kitchen Depression   . Dilated aortic root (H[facility])    aortic root 68mm and ascending aorta 53mm by echo 10/2016  . ED (erectile dysfunction)   . Environmental allergies    dry cough  . GERD (gastroesophageal reflux disease)   . History of hiatal hernia   . Hydronephrosis of right kidney    a. s/p ureteral stenting in the past.  . Hyperlipidemia    under control  . Hypertension   .  Hypothyroidism   . OCD (obsessive compulsive disorder)   . Panic disorder   . PONV (postoperative nausea and vomiting)    after thyroid surgery no problems in 10 years   . Prostate cancer [facility])    prostate cancer- robotic prostatectomy - followed by radiation because of positive lymph node--and pt on lupron injections  . Sinus bradycardia    a. chronic, asymptomatic.  24 hour Holter 10/2016 showed Sinus bradycardia, sinus arrhythmia and normal sinus rhythm with average heart rate 50bpm and heart rate ranged from 32 to 100bpm.  . SUI (stress urinary incontinence), male    S/P ROBOTIC PROSTATECTOMY AND RADIATION TX    Past Surgical History:  Procedure Laterality Date  . COLONOSCOPY  07/2013   Dr. Oletta Lamas  . CORONARY ANGIOPLASTY WITH STENT PLACEMENT  06/20/2014   "2"  . CYSTOSCOPY  11/30/2011   Procedure: CYSTOSCOPY;  Surgeon: Reece Packer, MD;  Location: [facility];  Service: Urology;  Laterality: N/A;  Inplantation of Artificial Sphincter and Cystoscopy  . CYSTOSCOPY WITH RETROGRADE PYELOGRAM, URETEROSCOPY AND STENT PLACEMENT Right 02/11/2014   Procedure: CYSTOSCOPY WITH RETROGRADE PYELOGRAM, URETEROSCOPY ,URETERAL BIOPSY AND STENT PLACEMENT;  Surgeon: Raynelle Bring, MD;  Location: [facility];  Service: Urology;  Laterality: Right;  . CYSTOSCOPY WITH RETROGRADE PYELOGRAM, URETEROSCOPY AND STENT PLACEMENT Right 04/15/2014   Procedure: CYSTOSCOPY  WITH RETROGRADE PYELOGRAM, URETEROSCOPY, BIOPSY AND STENT PLACEMENT;  Surgeon: Raynelle Bring, MD;  Location: [facility];  Service: Urology;  Laterality: Right;  . LEFT HEART CATH AND CORONARY ANGIOGRAPHY N/A 03/29/2017   Procedure: LEFT HEART CATH AND CORONARY ANGIOGRAPHY;  Surgeon: Leonie Man, MD;  Location: [facility];  Service: Cardiovascular;  Laterality: N/A;  . LEFT HEART CATHETERIZATION WITH CORONARY ANGIOGRAM N/A 06/20/2014   Procedure: LEFT HEART CATHETERIZATION WITH CORONARY ANGIOGRAM;  Surgeon: Leonie Man, MD;  Location: [facility] CATH  LAB;  Service: Cardiovascular;  Laterality: N/A;  . PERCUTANEOUS CORONARY STENT INTERVENTION (PCI-S)  06/20/2014   Procedure: PERCUTANEOUS CORONARY STENT INTERVENTION (PCI-S);  Surgeon: Leonie Man, MD;  Location: [facility] CATH LAB;  Service: Cardiovascular;;  LAD and Circumflex  . REFRACTIVE SURGERY Bilateral 2004  . ROBOT ASSISTED LAPAROSCOPIC RADICAL PROSTATECTOMY  05/2010  . THYROIDECTOMY, PARTIAL  10/1974  . TONSILLECTOMY  1956   . TOTAL THYROIDECTOMY  10/2012   "nuked it"  . UPPER GI ENDOSCOPY     food impaction 2009 done by Dr Oletta Lamas  . URINARY SPHINCTER IMPLANT  11/30/2011   Procedure: ARTIFICIAL URINARY SPHINCTER;  Surgeon: Reece Packer, MD;  Location: [facility];  Service: Urology;  Laterality: N/A;    Current Medications: Current Meds  Medication Sig  . aspirin EC 81 MG tablet Take 81 mg by mouth daily.  Marland Kitchen atorvastatin (LIPITOR) 40 MG tablet TAKE 1 TABLET BY MOUTH  DAILY  . Cholecalciferol (VITAMIN D3) 2000 units TABS Take 2,000 Units by mouth daily.  . fexofenadine (ALLEGRA) 180 MG tablet Take 180 mg by mouth daily.  . fluvoxaMINE (LUVOX) 50 MG tablet Take 200 mg by mouth every morning.   . furosemide (LASIX) 20 MG tablet TAKE 1 TABLET BY MOUTH  DAILY AS NEEDED FOR FLUID  OR EDEMA.  Marland Kitchen gemfibrozil (LOPID) 600 MG tablet Take 600 mg by mouth 2 (two) times daily.   Marland Kitchen latanoprost (XALATAN) 0.005 % ophthalmic solution Place 1 drop into both eyes at bedtime.   Marland Kitchen leuprolide (LUPRON) 30 MG injection Inject 30 mg into the muscle every 4 (four) months.  . levothyroxine (SYNTHROID, LEVOTHROID) 175 MCG tablet Take 175 mcg by mouth daily before breakfast.  . lisinopril (ZESTRIL) 5 MG tablet TAKE 1 TABLET BY MOUTH  DAILY  . nitroGLYCERIN (NITROSTAT) 0.4 MG SL tablet Place 1 tablet (0.4 mg total) under the tongue every 5 (five) minutes as needed for chest pain.  . pantoprazole (PROTONIX) 40 MG tablet Take 2 tablets (80 mg total) by mouth daily. Please keep upcoming appt for future refills.  .  [DISCONTINUED] nitroGLYCERIN (NITROSTAT) 0.4 MG SL tablet PLACE 1 TABLET (0.4 MG TOTAL) UNDER THE TONGUE EVERY 5 (FIVE) MINUTES AS NEEDED FOR CHEST PAIN.     Allergies:   Niacin and related and Tagamet [cimetidine]   Social History   Socioeconomic History  . Marital status: Married    Spouse name: Not on file  . Number of children: Not on file  . Years of education: Not on file  . Highest education level: Not on file  Occupational History  . Not on file  Social Needs  . Financial resource strain: Not on file  . Food insecurity    Worry: Not on file    Inability: Not on file  . Transportation needs    Medical: Not on file    Non-medical: Not on file  Tobacco Use  . Smoking status: Never Smoker  . Smokeless tobacco: Never Used  Substance and Sexual Activity  . Alcohol use: Yes    Alcohol/week: 0.0 standard drinks    Comment: 06/20/2014 "1-2 drinks a couple times/month"  . Drug use: No  . Sexual activity: Yes  Lifestyle  . Physical activity    Days per week: Not on file    Minutes per session: Not on file  . Stress: Not on file  Relationships  . Social Herbalist on phone: Not on file    Gets together: Not on file    Attends religious service: Not on file    Active member of club or organization: Not on file    Attends meetings of clubs or organizations: Not on file    Relationship status: Not on file  Other Topics Concern  . Not on file  Social History Narrative  . Not on file     Family History: The patient's family history includes CAD in his brother; Cancer in his father; Heart disease in his father; Hyperlipidemia in his father.  ROS:   Please see the history of present illness.    ROS  All other systems reviewed and negative.   EKGs/Labs/Other Studies Reviewed:    The following studies were reviewed today: 2D echo  EKG:  EKG is  ordered today.  The ekg ordered today demonstrates sinus bradycardia at 42bpm with no ST changes  Recent Labs:  10/31/2018: ALT 25; BUN 22; Creatinine 0.92; Hemoglobin 11.7; Platelet Count 260; Potassium 4.2; Sodium 141   Recent Lipid Panel    Component Value Date/Time   CHOL 149 12/13/2017 1038   TRIG 167 (H) 12/13/2017 1038   HDL 43 12/13/2017 1038   CHOLHDL 3.5 12/13/2017 1038   CHOLHDL 3.8 03/29/2017 0554   VLDL 34 03/29/2017 0554   LDLCALC 73 12/13/2017 1038    Physical Exam:    VS:  BP 122/84   Pulse (!) 42   Ht 5\' 11"  (1.803 m)   Wt 259 lb (117.5 kg)   SpO2 98%   BMI 36.12 kg/m     Wt Readings from Last 3 Encounters:  01/30/19 259 lb (117.5 kg)  10/31/18 254 lb (115.2 kg)  12/13/17 266 lb 12.8 oz (121 kg)     GEN:  Well nourished, well developed in no acute distress HEENT: Normal NECK: No JVD; No carotid bruits LYMPHATICS: No lymphadenopathy CARDIAC: RRR, no murmurs, rubs, gallops RESPIRATORY:  Clear to auscultation without rales, wheezing or rhonchi  ABDOMEN: Soft, non-tender, non-distended MUSCULOSKELETAL:  No edema; No deformity  SKIN: Warm and dry NEUROLOGIC:  Alert and oriented x 3 PSYCHIATRIC:  Normal affect   ASSESSMENT:    1. Coronary artery disease due to lipid rich plaque   2. Essential hypertension   3. Dilated aortic root ([place])   4. Chronic diastolic heart failure ([place])   5. Pure hypercholesterolemia    PLAN:    In order of problems listed above:  1.  ASCAD - s/p DES to LCx 05/2014 with residual LAD diseasewith repeat cath 02/2017 with patent stents and otherwise mild nonobstructive dz.  No recent CP.  Continue ASA, statin.    2.  Hypertension - BP controlled.  Continue Lisinopril 5mg  daily.  Check creatinine.    3.  Dilated aortic root - 76mm by recent echo.  Repeat limited echo in 1 year.  BP controlled.  Continue statin.   4.  Chronic diastolic CHF - SOB has been stable and no LE edema.  Continue PRN diuretics.  5.  Hyperlipidemia - LDL goal < 70.  Repeat FLP and ALT.  Continue Atorvastatin and Gemfibrozil.    6.  Asymptomatic bradycardia -  HR 42 which is normal for him.  He is completely asymptomatic.  I have encouraged him to call if he gets exertional fatigue, dizziness, weakness or syncope .   Medication Adjustments/Labs and Tests Ordered: Current medicines are reviewed at length with the patient today.  Concerns regarding medicines are outlined above.  Orders Placed This Encounter  Procedures  . EKG 12-Lead   Meds ordered this encounter  Medications  . nitroGLYCERIN (NITROSTAT) 0.4 MG SL tablet    Sig: Place 1 tablet (0.4 mg total) under the tongue every 5 (five) minutes as needed for chest pain.    Dispense:  25 tablet    Refill:  3    Signed, Fransico Him, MD  01/30/2019 8:31 AM    [place]

## 2019-01-30 ENCOUNTER — Encounter: Payer: Self-pay | Admitting: Cardiology

## 2019-01-30 ENCOUNTER — Other Ambulatory Visit: Payer: Self-pay

## 2019-01-30 ENCOUNTER — Other Ambulatory Visit: Payer: Medicare Other

## 2019-01-30 ENCOUNTER — Ambulatory Visit (INDEPENDENT_AMBULATORY_CARE_PROVIDER_SITE_OTHER): Payer: Medicare Other | Admitting: Cardiology

## 2019-01-30 VITALS — BP 122/84 | HR 42 | Ht 71.0 in | Wt 259.0 lb

## 2019-01-30 DIAGNOSIS — I10 Essential (primary) hypertension: Secondary | ICD-10-CM | POA: Diagnosis not present

## 2019-01-30 DIAGNOSIS — I2510 Atherosclerotic heart disease of native coronary artery without angina pectoris: Secondary | ICD-10-CM | POA: Diagnosis not present

## 2019-01-30 DIAGNOSIS — E7800 Pure hypercholesterolemia, unspecified: Secondary | ICD-10-CM

## 2019-01-30 DIAGNOSIS — I77810 Thoracic aortic ectasia: Secondary | ICD-10-CM

## 2019-01-30 DIAGNOSIS — I2583 Coronary atherosclerosis due to lipid rich plaque: Secondary | ICD-10-CM

## 2019-01-30 DIAGNOSIS — E785 Hyperlipidemia, unspecified: Secondary | ICD-10-CM

## 2019-01-30 DIAGNOSIS — I5032 Chronic diastolic (congestive) heart failure: Secondary | ICD-10-CM | POA: Diagnosis not present

## 2019-01-30 DIAGNOSIS — R001 Bradycardia, unspecified: Secondary | ICD-10-CM

## 2019-01-30 LAB — LIPID PANEL
Chol/HDL Ratio: 3.5 ratio (ref 0.0–5.0)
Cholesterol, Total: 137 mg/dL (ref 100–199)
HDL: 39 mg/dL — ABNORMAL LOW (ref >39)
LDL Chol Calc (NIH): 68 mg/dL (ref 0–99)
Triglycerides: 175 mg/dL — ABNORMAL HIGH (ref 0–149)
VLDL Cholesterol Cal: 30 mg/dL (ref 5–40)

## 2019-01-30 LAB — HEPATIC FUNCTION PANEL
ALT: 21 IU/L (ref 0–44)
AST: 18 IU/L (ref 0–40)
Albumin: 4.6 g/dL (ref 3.8–4.8)
Alkaline Phosphatase: 88 IU/L (ref 39–117)
Bilirubin Total: 0.2 mg/dL (ref 0.0–1.2)
Bilirubin, Direct: 0.09 mg/dL (ref 0.00–0.40)
Total Protein: 7.1 g/dL (ref 6.0–8.5)

## 2019-01-30 MED ORDER — NITROGLYCERIN 0.4 MG SL SUBL: 0.4000 mg | SUBLINGUAL_TABLET | SUBLINGUAL | 3 refills | Status: DC | PRN

## 2019-01-30 NOTE — Patient Instructions (Signed)
Medication Instructions:   Your physician recommends that you continue on your current medications as directed. Please refer to the Current Medication list given to you today.  If you need a refill on your cardiac medications before your next appointment, please call your pharmacy.     Follow-Up: At [facility], you and your health needs are our priority.  As part of our continuing mission to provide you with exceptional heart care, we have created designated Provider Care Teams.  These Care Teams include your primary Cardiologist (physician) and Advanced Practice Providers (APPs -  Physician Assistants and Nurse Practitioners) who all work together to provide you with the care you need, when you need it. You will need a follow up appointment in 12 months.  Please call our office 2 months in advance to schedule this appointment.  You may see Fransico Him, MD or one of the following Advanced Practice Providers on your designated Care Team:   [place], PA-C Melina Copa, PA-C . Ermalinda Barrios, PA-C

## 2019-01-31 ENCOUNTER — Telehealth: Payer: Self-pay

## 2019-01-31 DIAGNOSIS — E781 Pure hyperglyceridemia: Secondary | ICD-10-CM

## 2019-01-31 NOTE — Telephone Encounter (Signed)
Notes recorded by Frederik Schmidt, RN on 01/31/2019 at 8:30 AM EDT  The patient has been notified of the result and verbalized understanding. All questions (if any) were answered.  Frederik Schmidt, RN 01/31/2019 8:30 AM

## 2019-01-31 NOTE — Telephone Encounter (Signed)
-----   Message from Sueanne Margarita, MD sent at 01/30/2019 10:19 PM EDT ----- TAGs elevated but LDL ok.  He needs to cut back on carbs and sugars.  Repeat FLP and ALT in 4 months

## 2019-03-17 ENCOUNTER — Other Ambulatory Visit: Payer: Self-pay | Admitting: Cardiology

## 2019-03-26 ENCOUNTER — Encounter: Payer: Self-pay | Admitting: Physician Assistant

## 2019-03-26 NOTE — Progress Notes (Signed)
Virtual Visit via Video Note   This visit type was conducted due to national recommendations for restrictions regarding the COVID-19 Pandemic (e.g. social distancing) in an effort to limit this patient's exposure and mitigate transmission in our community.  Due to his co-morbid illnesses, this patient is at least at moderate risk for complications without adequate follow up.  This format is felt to be most appropriate for this patient at this time.  All issues noted in this document were discussed and addressed.  A limited physical exam was performed with this format.  Please refer to the patient's chart for his consent to telehealth for Blake Carter.   Date:  03/27/2019   ID:  Blake Carter, DOB 09-24-1950, MRN [id number]  Patient Location: Home Provider Location: Home  PCP:  Hulan Fess, MD  Cardiologist:  Fransico Him, MD   Electrophysiologist:  None   Evaluation Performed:  Follow-Up Visit  Chief Complaint: Palpitations History of Present Illness:    Blake Carter is a 68 y.o. male with ith a hx of CAD (DES to LCx 05/2014 with residual LAD disease with repeat cath 02/2017 with patent stents and otherwise mild nonobstructive dz), HTN, dyslipidemia, sinus bradycardia, dilated aortic root (73mm by echo 10/2018) and chronic diastolic CHF.   LOV with Dr. Radford Pax He had panic attacks and it was the only time he had palpitations. He has asymptomatic chronic sinus bradycardia.HR was 42/m. No changes made. F/U lipids stable except for high triglycerides.   Patient calls in complaining of palpitations for the last 3 weeks.Says it feels like an extra beat at times and has a squeezing sensation in the upper left chest that lasts 10-15 seconds at most. Some days it happens frequently other days he doesn't have it. Different symptoms from when he had stents.Drinks soda 1-2/day. Also started on a new injection for prostate cancer but doesn't know the name. Symptoms started before this.  No  exertional symptoms   The patient does not have symptoms concerning for COVID-19 infection (fever, chills, cough, or new shortness of breath).    Past Medical History:  Diagnosis Date   Allergic rhinitis    Arthritis    "a little bit in some of my joints" (06/20/2014)   Chronic diastolic CHF (congestive heart failure) (HCC)    Coronary artery disease    a. 05/2014 Cath/PCI: LM nl, LAD 40p, 60-70d (FFR 0.80->3.0x24 Synergy DES), D2 40, LCX 90d (2.25x12 Synergy DES), RCA 20-36m, EF 60-65%. b. Cath 02/2017 without acute change, elev LVEDP suggestive of dCHF.   Depression    Dilated aortic root (HCC)    aortic root 41mm and ascending aorta 47mm by echo 10/2016   ED (erectile dysfunction)    Environmental allergies    dry cough   GERD (gastroesophageal reflux disease)    History of hiatal hernia    Hydronephrosis of right kidney    a. s/p ureteral stenting in the past.   Hyperlipidemia    under control   Hypertension    Hypothyroidism    OCD (obsessive compulsive disorder)    Panic disorder    PONV (postoperative nausea and vomiting)    after thyroid surgery no problems in 10 years    Prostate cancer ([place])    prostate cancer- robotic prostatectomy - followed by radiation because of positive lymph node--and pt on lupron injections   Sinus bradycardia    a. chronic, asymptomatic.  24 hour Holter 10/2016 showed Sinus bradycardia, sinus arrhythmia and normal sinus rhythm with average  heart rate 50bpm and heart rate ranged from 32 to 100bpm.   SUI (stress urinary incontinence), male    S/P ROBOTIC PROSTATECTOMY AND RADIATION TX   Past Surgical History:  Procedure Laterality Date   COLONOSCOPY  07/2013   Dr. Oletta Lamas   CORONARY ANGIOPLASTY WITH STENT PLACEMENT  06/20/2014   "2"   CYSTOSCOPY  11/30/2011   Procedure: CYSTOSCOPY;  Surgeon: Reece Packer, MD;  Location: [facility];  Service: Urology;  Laterality: N/A;  Inplantation of Artificial Sphincter and Cystoscopy     CYSTOSCOPY WITH RETROGRADE PYELOGRAM, URETEROSCOPY AND STENT PLACEMENT Right 02/11/2014   Procedure: CYSTOSCOPY WITH RETROGRADE PYELOGRAM, URETEROSCOPY ,URETERAL BIOPSY AND STENT PLACEMENT;  Surgeon: Raynelle Bring, MD;  Location: [facility];  Service: Urology;  Laterality: Right;   CYSTOSCOPY WITH RETROGRADE PYELOGRAM, URETEROSCOPY AND STENT PLACEMENT Right 04/15/2014   Procedure: CYSTOSCOPY WITH RETROGRADE PYELOGRAM, URETEROSCOPY, BIOPSY AND STENT PLACEMENT;  Surgeon: Raynelle Bring, MD;  Location: [facility];  Service: Urology;  Laterality: Right;   LEFT HEART CATH AND CORONARY ANGIOGRAPHY N/A 03/29/2017   Procedure: LEFT HEART CATH AND CORONARY ANGIOGRAPHY;  Surgeon: Leonie Man, MD;  Location: [facility];  Service: Cardiovascular;  Laterality: N/A;   LEFT HEART CATHETERIZATION WITH CORONARY ANGIOGRAM N/A 06/20/2014   Procedure: LEFT HEART CATHETERIZATION WITH CORONARY ANGIOGRAM;  Surgeon: Leonie Man, MD;  Location: [facility] CATH [facility];  Service: Cardiovascular;  Laterality: N/A;   PERCUTANEOUS CORONARY STENT INTERVENTION (PCI-S)  06/20/2014   Procedure: PERCUTANEOUS CORONARY STENT INTERVENTION (PCI-S);  Surgeon: Leonie Man, MD;  Location: [facility] C[facility];  Service: Cardiovascular;;  LAD and Circumflex   REFRACTIVE SURGERY Bilateral 2004   ROBOT ASSISTED LAPAROSCOPIC RADICAL PROSTATECTOMY  05/2010   THYROIDECTOMY, PARTIAL  10/1974   TONSILLECTOMY  1956    TOTAL THYROIDECTOMY  10/2012   "nuked it"   UPPER GI ENDOSCOPY     food impaction 2009 done by Dr Oletta Lamas   URINARY SPHINCTER IMPLANT  11/30/2011   Procedure: ARTIFICIAL URINARY SPHINCTER;  Surgeon: Reece Packer, MD;  Location: [facility];  Service: Urology;  Laterality: N/A;     Current Meds  Medication Sig   aspirin EC 81 MG tablet Take 81 mg by mouth daily.   atorvastatin (LIPITOR) 40 MG tablet TAKE 1 TABLET BY MOUTH  DAILY   Cholecalciferol (VITAMIN D3) 2000 units TABS Take 2,000 Units by mouth daily.   fexofenadine  (ALLEGRA) 180 MG tablet Take 180 mg by mouth daily.   fluvoxaMINE (LUVOX) 50 MG tablet Take 200 mg by mouth every morning.    furosemide (LASIX) 20 MG tablet TAKE 1 TABLET BY MOUTH  DAILY AS NEEDED FOR FLUID  OR EDEMA   gemfibrozil (LOPID) 600 MG tablet Take 600 mg by mouth 2 (two) times daily.    latanoprost (XALATAN) 0.005 % ophthalmic solution Place 1 drop into both eyes at bedtime.    levothyroxine (SYNTHROID, LEVOTHROID) 175 MCG tablet Take 175 mcg by mouth daily before breakfast.   lisinopril (ZESTRIL) 5 MG tablet TAKE 1 TABLET BY MOUTH  DAILY   MYRBETRIQ 50 MG TB24 tablet Take 50 mg by mouth daily.   nitroGLYCERIN (NITROSTAT) 0.4 MG SL tablet Place 1 tablet (0.4 mg total) under the tongue every 5 (five) minutes as needed for chest pain.   pantoprazole (PROTONIX) 40 MG tablet Take 2 tablets (80 mg total) by mouth daily. Please keep upcoming appt for future refills.   zinc gluconate 50 MG tablet Take 50 mg by mouth daily.  Allergies:   Niacin and related and Tagamet [cimetidine]   Social History   Tobacco Use   Smoking status: Never Smoker   Smokeless tobacco: Never Used  Substance Use Topics   Alcohol use: Yes    Alcohol/week: 0.0 standard drinks    Comment: 06/20/2014 "1-2 drinks a couple times/month"   Drug use: No     Family Hx: The patient's family history includes CAD in his brother; Cancer in his father; Heart disease in his father; Hyperlipidemia in his father.  ROS:   Please see the history of present illness.      All other systems reviewed and are negative.   Prior CV studies:   The following studies were reviewed today:  CT 7/2020IMPRESSION: 1. Slight interval progression of thoracic aortic ectasia with ascending segment now measuring up to 4.2 cm diameter. Recommend annual imaging followup by CTA or MRA. This recommendation follows 2010 ACCF/AHA/AATS/ACR/ASA/SCA/SCAI/SIR/STS/SVM Guidelines for the Diagnosis and Management of Patients with  Thoracic Aortic Disease. Circulation. 2010; [id number]. Aortic aneurysm NOS (ICD10-I71.9) 2. Hepatic steatosis.      Echo 6/22/20IMPRESSIONS      1. The left ventricle has normal systolic function with an ejection fraction of 60-65%. The cavity size was normal. Left ventricular diastolic parameters were normal.  2. The right ventricle has normal systolic function. The cavity was normal. There is no increase in right ventricular wall thickness.  3. Left atrial size was moderately dilated.  4. Mild thickening of the mitral valve leaflet.  5. The aortic valve is tricuspid. Mild thickening of the aortic valve. Aortic valve regurgitation is trivial by color flow Doppler.  6. There is mild dilatation of the aortic root measuring 42 mm.   FINDINGS  Left Ventricle: The left ventricle has normal systolic function, with an ejection fraction of 60-65%. The cavity size was normal. There is no increase in left ventricular wall thickness. Left ventricular diastolic parameters were normal.   Right Ventricle: The right ventricle has normal systolic function. The cavity was normal. There is no increase in right ventricular wall thickness.   Left Atrium: Left atrial size was moderately dilated.   Right Atrium: Right atrial size was normal in size. Right atrial pressure is estimated at 3 mmHg.   Interatrial Septum: No atrial level shunt detected by color flow Doppler.   Pericardium: There is no evidence of pericardial effusion.   Mitral Valve: The mitral valve is normal in structure. Mild thickening of the mitral valve leaflet. Mitral valve regurgitation is trivial by color flow Doppler.   Tricuspid Valve: The tricuspid valve is normal in structure. Tricuspid valve regurgitation is trivial by color flow Doppler.   Aortic Valve: The aortic valve is tricuspid Mild thickening of the aortic valve. Aortic valve regurgitation is trivial by color flow Doppler. There is no evidence of aortic valve  stenosis.   Pulmonic Valve: The pulmonic valve was grossly normal. Pulmonic valve regurgitation was not assessed by color flow Doppler.   Aorta: There is mild dilatation of the aortic root measuring 42 mm.   Venous: The inferior vena cava is normal in size with greater than 50% respiratory variability.     Cath 10/2018Mid LAD Synergy DES & dCx-LPL(OM2) Synergy DES from 07/2014, 0 %stenosed.  LV end diastolic pressure is moderately elevated.   Widely patent stents, with otherwise mild disease.  No culprit lesion to explain exertional chest pain. He just had an echocardiogram done with normal LV function.  LVEDP is mild to moderately elevated.  Plan: Defer to Dr. Radford Pax for ongoing management.  Consider treating diastolic heart failure. Continue aggressive Risk factor medication.        Labs/Other Tests and Data Reviewed:    EKG:  No ECG reviewed.  Recent Labs: 10/31/2018: BUN 22; Creatinine 0.92; Hemoglobin 11.7; Platelet Count 260; Potassium 4.2; Sodium 141 01/30/2019: ALT 21   Recent Lipid Panel Lab Results  Component Value Date/Time   CHOL 137 01/30/2019 07:56 AM   TRIG 175 (H) 01/30/2019 07:56 AM   HDL 39 (L) 01/30/2019 07:56 AM   CHOLHDL 3.5 01/30/2019 07:56 AM   CHOLHDL 3.8 03/29/2017 05:54 AM   LDLCALC 68 01/30/2019 07:56 AM    Wt Readings from Last 3 Encounters:  03/27/19 250 lb (113.4 kg)  01/30/19 259 lb (117.5 kg)  10/31/18 254 lb (115.2 kg)     Objective:    Vital Signs:  BP (!) 158/87    Pulse (!) 49    Ht 5\' 11"  (1.803 m)    Wt 250 lb (113.4 kg)    BMI 34.87 kg/m    VITAL SIGNS:  reviewed GEN:  no acute distress RESPIRATORY:  normal respiratory effort, symmetric expansion CARDIOVASCULAR:  no peripheral edema  ASSESSMENT & PLAN:     Palpitations-a lot of skipping over the past 3 weeks similar to when his thyroid problems happen years ago.  Has upper chest pain with it but it goes away within 10 to 15 seconds.  Will check electrolytes CBC and TSH in  place 2-week monitor to rule out arrhythmias   Asymptomatic bradycardia - HR 42 which is normal for him.    ASCAD - s/p DES to LCx 05/2014 with residual LAD disease with repeat cath 02/2017 with patent stents and otherwise mild nonobstructive dz. chest pain with palpitations but not exertion. Continue ASA, statin.      Hypertension - BP controlled.  Continue Lisinopril 5mg  daily.  Check creatinine.       Dilated aortic root - 58mm on echo 11/20/18.   BP controlled.  Continue statin.    Chronic diastolic CHF on PRN diuretics.       Hyperlipidemia - LDL 68 triglycerides 175 01/30/19. on Atorvastatin and Gemfibrozil.          COVID-19 Education: The signs and symptoms of COVID-19 were discussed with the patient and how to seek care for testing (follow up with PCP or arrange E-visit).  The importance of social distancing was discussed today.  Time:   Today, I have spent 14 minutes with the patient with telehealth technology discussing the above problems.     Medication Adjustments/Labs and Tests Ordered: Current medicines are reviewed at length with the patient today.  Concerns regarding medicines are outlined above.   Tests Ordered: Orders Placed This Encounter  Procedures   CBC   Comprehensive metabolic panel   TSH   LONG TERM MONITOR (3-14 DAYS)    Medication Changes: No orders of the defined types were placed in this encounter.   Follow Up:  Either In Person or Virtual in 6 week(s) Ermalinda Barrios, PA-C  Signed, Ermalinda Barrios, PA-C  03/27/2019 1:24 PM     [facility]

## 2019-03-27 ENCOUNTER — Other Ambulatory Visit: Payer: Self-pay

## 2019-03-27 ENCOUNTER — Encounter: Payer: Self-pay | Admitting: Physician Assistant

## 2019-03-27 ENCOUNTER — Telehealth (INDEPENDENT_AMBULATORY_CARE_PROVIDER_SITE_OTHER): Payer: Medicare Other | Admitting: Physician Assistant

## 2019-03-27 VITALS — BP 158/87 | HR 49 | Ht 71.0 in | Wt 250.0 lb

## 2019-03-27 DIAGNOSIS — R002 Palpitations: Secondary | ICD-10-CM | POA: Diagnosis not present

## 2019-03-27 DIAGNOSIS — E782 Mixed hyperlipidemia: Secondary | ICD-10-CM

## 2019-03-27 DIAGNOSIS — I5032 Chronic diastolic (congestive) heart failure: Secondary | ICD-10-CM

## 2019-03-27 DIAGNOSIS — I77810 Thoracic aortic ectasia: Secondary | ICD-10-CM

## 2019-03-27 DIAGNOSIS — I10 Essential (primary) hypertension: Secondary | ICD-10-CM | POA: Diagnosis not present

## 2019-03-27 DIAGNOSIS — I2510 Atherosclerotic heart disease of native coronary artery without angina pectoris: Secondary | ICD-10-CM

## 2019-03-27 DIAGNOSIS — R001 Bradycardia, unspecified: Secondary | ICD-10-CM

## 2019-03-27 NOTE — Patient Instructions (Addendum)
Medication Instructions:  Your physician recommends that you continue on your current medications as directed. Please refer to the Current Medication list given to you today.  If you need a refill on your cardiac medications before your next appointment, please call your pharmacy.   Lab work: Your physician recommends that you return for lab work (CBC, CMET, TSH, T4) on 10/28  If you have labs (blood work) drawn today and your tests are completely normal, you will receive your results only by: Marland Kitchen MyChart Message (if you have MyChart) OR . A paper copy in the mail If you have any lab test that is abnormal or we need to change your treatment, we will call you to review the results.  Testing/Procedures: Your physician has recommended that you wear a 14 day monitor. These monitors are medical devices that record the heart's electrical activity. Doctors most often use these monitors to diagnose arrhythmias. Arrhythmias are problems with the speed or rhythm of the heartbeat. The monitor is a small, portable device. You can wear one while you do your normal daily activities. This is usually used to diagnose what is causing palpitations/syncope (passing out).    Follow-Up: . Follow up with Ermalinda Barrios, PA via VIDEO VISIT on 05/09/19 at 10:00 AM  Any Other Special Instructions Will Be Listed Below (If Applicable).

## 2019-03-28 ENCOUNTER — Other Ambulatory Visit: Payer: Medicare Other | Admitting: *Deleted

## 2019-03-28 ENCOUNTER — Other Ambulatory Visit: Payer: Self-pay

## 2019-03-28 DIAGNOSIS — I2510 Atherosclerotic heart disease of native coronary artery without angina pectoris: Secondary | ICD-10-CM

## 2019-03-28 DIAGNOSIS — I10 Essential (primary) hypertension: Secondary | ICD-10-CM

## 2019-03-28 DIAGNOSIS — R002 Palpitations: Secondary | ICD-10-CM

## 2019-03-28 DIAGNOSIS — I5032 Chronic diastolic (congestive) heart failure: Secondary | ICD-10-CM

## 2019-03-28 DIAGNOSIS — R001 Bradycardia, unspecified: Secondary | ICD-10-CM

## 2019-03-28 DIAGNOSIS — E782 Mixed hyperlipidemia: Secondary | ICD-10-CM

## 2019-03-28 DIAGNOSIS — I77810 Thoracic aortic ectasia: Secondary | ICD-10-CM

## 2019-03-28 LAB — COMPREHENSIVE METABOLIC PANEL
ALT: 30 IU/L (ref 0–44)
AST: 24 IU/L (ref 0–40)
Albumin/Globulin Ratio: 1.7 (ref 1.2–2.2)
Albumin: 4.4 g/dL (ref 3.8–4.8)
Alkaline Phosphatase: 98 IU/L (ref 39–117)
BUN/Creatinine Ratio: 27 — ABNORMAL HIGH (ref 10–24)
BUN: 24 mg/dL (ref 8–27)
Bilirubin Total: 0.3 mg/dL (ref 0.0–1.2)
CO2: 24 mmol/L (ref 20–29)
Calcium: 9.5 mg/dL (ref 8.6–10.2)
Chloride: 101 mmol/L (ref 96–106)
Creatinine, Ser: 0.88 mg/dL (ref 0.76–1.27)
GFR calc Af Amer: 103 mL/min/{1.73_m2} (ref >59)
GFR calc non Af Amer: 89 mL/min/{1.73_m2} (ref >59)
Globulin, Total: 2.6 g/dL (ref 1.5–4.5)
Glucose: 129 mg/dL — ABNORMAL HIGH (ref 65–99)
Potassium: 4.6 mmol/L (ref 3.5–5.2)
Sodium: 140 mmol/L (ref 134–144)
Total Protein: 7 g/dL (ref 6.0–8.5)

## 2019-03-28 LAB — CBC
Hematocrit: 36.3 % — ABNORMAL LOW (ref 37.5–51.0)
Hemoglobin: 12.1 g/dL — ABNORMAL LOW (ref 13.0–17.7)
MCH: 29.1 pg (ref 26.6–33.0)
MCHC: 33.3 g/dL (ref 31.5–35.7)
MCV: 87 fL (ref 79–97)
Platelets: 267 10*3/uL (ref 150–450)
RBC: 4.16 x10E6/uL (ref 4.14–5.80)
RDW: 12.9 % (ref 11.6–15.4)
WBC: 4.8 10*3/uL (ref 3.4–10.8)

## 2019-03-28 LAB — T4, FREE: Free T4: 1.29 ng/dL (ref 0.82–1.77)

## 2019-03-28 LAB — TSH: TSH: 0.468 u[IU]/mL (ref 0.450–4.500)

## 2019-04-03 ENCOUNTER — Other Ambulatory Visit: Payer: Self-pay | Admitting: Cardiology

## 2019-04-04 ENCOUNTER — Telehealth: Payer: Self-pay | Admitting: *Deleted

## 2019-04-04 NOTE — Telephone Encounter (Signed)
14 ZIO XT long term holter monitor to be mailed to the patients home.  Instructions reviewed briefly as they are included in the monitor kit. 

## 2019-04-10 ENCOUNTER — Ambulatory Visit (INDEPENDENT_AMBULATORY_CARE_PROVIDER_SITE_OTHER): Payer: Medicare Other

## 2019-04-10 DIAGNOSIS — R001 Bradycardia, unspecified: Secondary | ICD-10-CM | POA: Diagnosis not present

## 2019-04-10 DIAGNOSIS — R002 Palpitations: Secondary | ICD-10-CM

## 2019-05-03 ENCOUNTER — Telehealth: Payer: Self-pay

## 2019-05-03 DIAGNOSIS — R002 Palpitations: Secondary | ICD-10-CM

## 2019-05-03 DIAGNOSIS — I495 Sick sinus syndrome: Secondary | ICD-10-CM

## 2019-05-03 NOTE — Telephone Encounter (Signed)
Notes recorded by Frederik Schmidt, RN on 05/03/2019 at 2:34 PM EST  The patient has been notified of the result and verbalized understanding. All questions (if any) were answered.  Frederik Schmidt, RN 05/03/2019 2:34 PM

## 2019-05-03 NOTE — Telephone Encounter (Signed)
-----   Message from Sueanne Margarita, MD sent at 05/03/2019  2:09 PM EST ----- Please let patient know that heart monitor showed mainly slow heart rate with average of 47bpm.  There are frequent extra heart beats from the top and bottom of the heart that are what his palpitations are.  His HR is too slow to treat with standard BB or CCB.  Please refer to EP for evaluation

## 2019-05-07 ENCOUNTER — Other Ambulatory Visit: Payer: Self-pay

## 2019-05-07 ENCOUNTER — Encounter: Payer: Self-pay | Admitting: Internal Medicine

## 2019-05-07 ENCOUNTER — Ambulatory Visit: Payer: Medicare Other | Admitting: Internal Medicine

## 2019-05-07 VITALS — BP 150/88 | HR 50 | Ht 71.0 in | Wt 263.0 lb

## 2019-05-07 DIAGNOSIS — R001 Bradycardia, unspecified: Secondary | ICD-10-CM | POA: Diagnosis not present

## 2019-05-07 DIAGNOSIS — R002 Palpitations: Secondary | ICD-10-CM

## 2019-05-07 LAB — CBC WITH DIFFERENTIAL/PLATELET
Basophils Absolute: 0 10*3/uL (ref 0.0–0.2)
Basos: 1 %
EOS (ABSOLUTE): 0.2 10*3/uL (ref 0.0–0.4)
Eos: 3 %
Hematocrit: 35.7 % — ABNORMAL LOW (ref 37.5–51.0)
Hemoglobin: 12 g/dL — ABNORMAL LOW (ref 13.0–17.7)
Lymphocytes Absolute: 1.7 10*3/uL (ref 0.7–3.1)
Lymphs: 36 %
MCH: 29.5 pg (ref 26.6–33.0)
MCHC: 33.6 g/dL (ref 31.5–35.7)
MCV: 88 fL (ref 79–97)
Monocytes Absolute: 0.4 10*3/uL (ref 0.1–0.9)
Monocytes: 8 %
Neutrophils Absolute: 2.5 10*3/uL (ref 1.4–7.0)
Neutrophils: 52 %
Platelets: 245 10*3/uL (ref 150–450)
RBC: 4.07 x10E6/uL — ABNORMAL LOW (ref 4.14–5.80)
RDW: 13.9 % (ref 11.6–15.4)
WBC: 4.8 10*3/uL (ref 3.4–10.8)

## 2019-05-07 LAB — BASIC METABOLIC PANEL
BUN/Creatinine Ratio: 24 (ref 10–24)
BUN: 21 mg/dL (ref 8–27)
CO2: 26 mmol/L (ref 20–29)
Calcium: 9.6 mg/dL (ref 8.6–10.2)
Chloride: 106 mmol/L (ref 96–106)
Creatinine, Ser: 0.88 mg/dL (ref 0.76–1.27)
GFR calc Af Amer: 103 mL/min/{1.73_m2} (ref >59)
GFR calc non Af Amer: 89 mL/min/{1.73_m2} (ref >59)
Glucose: 106 mg/dL — ABNORMAL HIGH (ref 65–99)
Potassium: 4.5 mmol/L (ref 3.5–5.2)
Sodium: 142 mmol/L (ref 134–144)

## 2019-05-07 NOTE — H&P (View-Only) (Signed)
HPI Blake Carter returns today for evaluation of sinus node dysfunction and palpitations. He is a pleasant 68 yo man with CAD, s/p PCI. He has a h/o HTN and a long h/o a slow HR. He has not had syncope. He can get his HR up to 90 with extreme exertion. He denies chest pain or sob. No anginal symptoms. He has gained 10 lbs in the past 6 months. He wore a cardiac monitor which demonstrated HR's in the 30's and 40's with an ave of 47 bpm. He denies sob. He has PVC's, NS AT, and marked sinus bradycardia. No heart block.  Allergies  Allergen Reactions  . Niacin And Related Other (See Comments)    Flushing   . Tagamet [Cimetidine] Other (See Comments)    unknown     Current Outpatient Medications  Medication Sig Dispense Refill  . Acetaminophen (TYLENOL PO) Take 1 tablet by mouth as needed.    Marland Kitchen aspirin EC 81 MG tablet Take 81 mg by mouth daily.    Marland Kitchen atorvastatin (LIPITOR) 40 MG tablet TAKE 1 TABLET BY MOUTH  DAILY 90 tablet 3  . Cholecalciferol (VITAMIN D3) 2000 units TABS Take 2,000 Units by mouth daily.    . fexofenadine (ALLEGRA) 180 MG tablet Take 180 mg by mouth daily.    . fluvoxaMINE (LUVOX) 50 MG tablet Take 200 mg by mouth every morning.     . furosemide (LASIX) 20 MG tablet TAKE 1 TABLET BY MOUTH  DAILY AS NEEDED FOR FLUID  OR EDEMA 90 tablet 3  . gemfibrozil (LOPID) 600 MG tablet Take 600 mg by mouth 2 (two) times daily.     . Ibuprofen (ADVIL PO) Take 1 tablet by mouth as needed.    . latanoprost (XALATAN) 0.005 % ophthalmic solution Place 1 drop into both eyes at bedtime.     Marland Kitchen levothyroxine (SYNTHROID, LEVOTHROID) 175 MCG tablet Take 175 mcg by mouth daily before breakfast.    . lisinopril (ZESTRIL) 5 MG tablet TAKE 1 TABLET BY MOUTH  DAILY 90 tablet 3  . Multiple Vitamin (MULTIVITAMIN) capsule Take 1 capsule by mouth daily.    Marland Kitchen MYRBETRIQ 50 MG TB24 tablet Take 50 mg by mouth daily.    . nitroGLYCERIN (NITROSTAT) 0.4 MG SL tablet Place 1 tablet (0.4 mg total) under the  tongue every 5 (five) minutes as needed for chest pain. 25 tablet 3  . pantoprazole (PROTONIX) 40 MG tablet TAKE 2 TABLETS BY MOUTH  DAILY 180 tablet 3  . zinc gluconate 50 MG tablet Take 50 mg by mouth daily.     No current facility-administered medications for this visit.      Past Medical History:  Diagnosis Date  . Allergic rhinitis   . Arthritis    "a little bit in some of my joints" (06/20/2014)  . Chronic diastolic CHF (congestive heart failure) ([place])   . Coronary artery disease    a. 05/2014 Cath/PCI: LM nl, LAD 40p, 60-70d (FFR 0.80->3.0x24 Synergy DES), D2 40, LCX 90d (2.25x12 Synergy DES), RCA 20-52m, EF 60-65%. b. Cath 02/2017 without acute change, elev LVEDP suggestive of dCHF.  Marland Kitchen Depression   . Dilated aortic root (HCC)    aortic root 49mm and ascending aorta 20mm by echo 10/2016  . ED (erectile dysfunction)   . Environmental allergies    dry cough  . GERD (gastroesophageal reflux disease)   . History of hiatal hernia   . Hydronephrosis of right kidney    a.  s/p ureteral stenting in the past.  . Hyperlipidemia    under control  . Hypertension   . Hypothyroidism   . OCD (obsessive compulsive disorder)   . Panic disorder   . PONV (postoperative nausea and vomiting)    after thyroid surgery no problems in 10 years   . Prostate cancer [facility])    prostate cancer- robotic prostatectomy - followed by radiation because of positive lymph node--and pt on lupron injections  . Sinus bradycardia    a. chronic, asymptomatic.  24 hour Holter 10/2016 showed Sinus bradycardia, sinus arrhythmia and normal sinus rhythm with average heart rate 50bpm and heart rate ranged from 32 to 100bpm.  . SUI (stress urinary incontinence), male    S/P ROBOTIC PROSTATECTOMY AND RADIATION TX    ROS:   All systems reviewed and negative except as noted in the HPI.   Past Surgical History:  Procedure Laterality Date  . COLONOSCOPY  07/2013   Dr. Oletta Lamas  . CORONARY ANGIOPLASTY WITH STENT  PLACEMENT  06/20/2014   "2"  . CYSTOSCOPY  11/30/2011   Procedure: CYSTOSCOPY;  Surgeon: Reece Packer, MD;  Location: [facility];  Service: Urology;  Laterality: N/A;  Inplantation of Artificial Sphincter and Cystoscopy  . CYSTOSCOPY WITH RETROGRADE PYELOGRAM, URETEROSCOPY AND STENT PLACEMENT Right 02/11/2014   Procedure: CYSTOSCOPY WITH RETROGRADE PYELOGRAM, URETEROSCOPY ,URETERAL BIOPSY AND STENT PLACEMENT;  Surgeon: Raynelle Bring, MD;  Location: [facility];  Service: Urology;  Laterality: Right;  . CYSTOSCOPY WITH RETROGRADE PYELOGRAM, URETEROSCOPY AND STENT PLACEMENT Right 04/15/2014   Procedure: CYSTOSCOPY WITH RETROGRADE PYELOGRAM, URETEROSCOPY, BIOPSY AND STENT PLACEMENT;  Surgeon: Raynelle Bring, MD;  Location: [facility];  Service: Urology;  Laterality: Right;  . LEFT HEART CATH AND CORONARY ANGIOGRAPHY N/A 03/29/2017   Procedure: LEFT HEART CATH AND CORONARY ANGIOGRAPHY;  Surgeon: Leonie Man, MD;  Location: [facility];  Service: Cardiovascular;  Laterality: N/A;  . LEFT HEART CATHETERIZATION WITH CORONARY ANGIOGRAM N/A 06/20/2014   Procedure: LEFT HEART CATHETERIZATION WITH CORONARY ANGIOGRAM;  Surgeon: Leonie Man, MD;  Location[facility] CATH LAB;  Service: Cardiovascular;  Laterality: N/A;  . PERCUTANEOUS CORONARY STENT INTERVENTION (PCI-S)  06/20/2014   Procedure: PERCUTANEOUS CORONARY STENT INTERVENTION (PCI-S);  Surgeon: Leonie Man, MD;  Location: [facility] CATH LAB;  Service: Cardiovascular;;  LAD and Circumflex  . REFRACTIVE SURGERY Bilateral 2004  . ROBOT ASSISTED LAPAROSCOPIC RADICAL PROSTATECTOMY  05/2010  . THYROIDECTOMY, PARTIAL  10/1974  . TONSILLECTOMY  1956   . TOTAL THYROIDECTOMY  10/2012   "nuked it"  . UPPER GI ENDOSCOPY     food impaction 2009 done by Dr Oletta Lamas  . URINARY SPHINCTER IMPLANT  11/30/2011   Procedure: ARTIFICIAL URINARY SPHINCTER;  Surgeon: Reece Packer, MD;  Location: [facility];  Service: Urology;  Laterality: N/A;     Family History  Problem  Relation Age of Onset  . Cancer Father   . Hyperlipidemia Father   . Heart disease Father        Father had CABG/aorta repair by the time he was in his 19s  . CAD Brother        Brother who is 35 years younger has had some sort of stenting     Social History   Socioeconomic History  . Marital status: Married    Spouse name: Not on file  . Number of children: Not on file  . Years of education: Not on file  . Highest education level: Not on file  Occupational History  .  Not on file  Social Needs  . Financial resource strain: Not on file  . Food insecurity    Worry: Not on file    Inability: Not on file  . Transportation needs    Medical: Not on file    Non-medical: Not on file  Tobacco Use  . Smoking status: Never Smoker  . Smokeless tobacco: Never Used  Substance and Sexual Activity  . Alcohol use: Yes    Alcohol/week: 0.0 standard drinks    Comment: 06/20/2014 "1-2 drinks a couple times/month"  . Drug use: No  . Sexual activity: Yes  Lifestyle  . Physical activity    Days per week: Not on file    Minutes per session: Not on file  . Stress: Not on file  Relationships  . Social Herbalist on phone: Not on file    Gets together: Not on file    Attends religious service: Not on file    Active member of club or organization: Not on file    Attends meetings of clubs or organizations: Not on file    Relationship status: Not on file  . Intimate partner violence    Fear of current or ex partner: Not on file    Emotionally abused: Not on file    Physically abused: Not on file    Forced sexual activity: Not on file  Other Topics Concern  . Not on file  Social History Narrative  . Not on file     BP (!) 150/88   Pulse (!) 50   Ht 5\' 11"  (1.803 m)   Wt 263 lb (119.3 kg)   SpO2 97%   BMI 36.68 kg/m   Physical Exam:  Well appearing NAD HEENT: Unremarkable Neck:  6 cm JVD, no thyromegally Lymphatics:  No adenopathy Back:  No CVA tenderness Lungs:   Clear with no wheezes HEART:  Regular rate rhythm, no murmurs, no rubs, no clicks Abd:  soft, positive bowel sounds, no organomegally, no rebound, no guarding Ext:  2 plus pulses, no edema, no cyanosis, no clubbing Skin:  No rashes no nodules Neuro:  CN II through XII intact, motor grossly intact   Cardiac monitor - reviewed  Assess/Plan: 1. Sinus node dysfunction - he has this along with chronotropic incompetence. In addition, he has symptomatic atrial and ventricular arrhythmias that cannot be treated due to the sinus node dysfunction. For all of the above, I have recommended insertion of PPM. I have reviewed the risks/benefits/goals/expectations of the procedure and he is willing to proceed. 2. HTN - his bp is up. I would anticipate initiation of a beta blocker after his PPM is inserted.  3. CAD - he denies anginal symptoms. He will continue ASA. 4. Dyslipidemia - he will continue his statin therapy.  Mikle Bosworth.D.

## 2019-05-07 NOTE — Patient Instructions (Addendum)
Medication Instructions:  Your physician recommends that you continue on your current medications as directed. Please refer to the Current Medication list given to you today.  Labwork: You will get lab work today:  BMP and CBC  Testing/Procedures: Your physician has recommended that you have a pacemaker inserted. A pacemaker is a small device that is placed under the skin of your chest or abdomen to help control abnormal heart rhythms. This device uses electrical pulses to prompt the heart to beat at a normal rate. Pacemakers are used to treat heart rhythms that are too slow. Wire (leads) are attached to the pacemaker that goes into the chambers of you heart. This is done in the hospital and usually requires and overnight stay. Please see the instruction sheet given to you today for more information.  Follow-Up:  SEE INSTRUCTION letter.   Any Other Special Instructions Will Be Listed Below (If Applicable).  If you need a refill on your cardiac medications before your next appointment, please call your pharmacy.    Pacemaker Implantation, Adult Pacemaker implantation is a procedure to place a pacemaker inside your chest. A pacemaker is a small computer that sends electrical signals to the heart and helps your heart beat normally. A pacemaker also stores information about your heart rhythms. You may need pacemaker implantation if you:  Have a slow heartbeat (bradycardia).  Faint (syncope).  Have shortness of breath (dyspnea) due to heart problems. The pacemaker attaches to your heart through a wire, called a lead. Sometimes just one lead is needed. Other times, there will be two leads. There are two types of pacemakers:  Transvenous pacemaker. This type is placed under the skin or muscle of your chest. The lead goes through a vein in the chest area to reach the inside of the heart.  Epicardial pacemaker. This type is placed under the skin or muscle of your chest or belly. The lead goes  through your chest to the outside of the heart. Tell a health care provider about:  Any allergies you have.  All medicines you are taking, including vitamins, herbs, eye drops, creams, and over-the-counter medicines.  Any problems you or family members have had with anesthetic medicines.  Any blood or bone disorders you have.  Any surgeries you have had.  Any medical conditions you have.  Whether you are pregnant or may be pregnant. What are the risks? Generally, this is a safe procedure. However, problems may occur, including:  Infection.  Bleeding.  Failure of the pacemaker or the lead.  Collapse of a lung or bleeding into a lung.  Blood clot inside a blood vessel with a lead.  Damage to the heart.  Infection inside the heart (endocarditis).  Allergic reactions to medicines. What happens before the procedure? Staying hydrated Follow instructions from your health care provider about hydration, which may include:  Up to 2 hours before the procedure - you may continue to drink clear liquids, such as water, clear fruit juice, black coffee, and plain tea. Eating and drinking restrictions Follow instructions from your health care provider about eating and drinking, which may include:  8 hours before the procedure - stop eating heavy meals or foods such as meat, fried foods, or fatty foods.  6 hours before the procedure - stop eating light meals or foods, such as toast or cereal.  6 hours before the procedure - stop drinking milk or drinks that contain milk.  2 hours before the procedure - stop drinking clear liquids. Medicines  Ask your health care provider about: ? Changing or stopping your regular medicines. This is especially important if you are taking diabetes medicines or blood thinners. ? Taking medicines such as aspirin and ibuprofen. These medicines can thin your blood. Do not take these medicines before your procedure if your health care provider instructs  you not to.  You may be given antibiotic medicine to help prevent infection. General instructions  You will have a heart evaluation. This may include an electrocardiogram (ECG), chest X-ray, and heart imaging (echocardiogram,  or echo) tests.  You will have blood tests.  Do not use any products that contain nicotine or tobacco, such as cigarettes and e-cigarettes. If you need help quitting, ask your health care provider.  Plan to have someone take you home from the hospital or clinic.  If you will be going home right after the procedure, plan to have someone with you for 24 hours.  Ask your health care provider how your surgical site will be marked or identified. What happens during the procedure?  To reduce your risk of infection: ? Your health care team will wash or sanitize their hands. ? Your skin will be washed with soap. ? Hair may be removed from the surgical area.  An IV tube will be inserted into one of your veins.  You will be given one or more of the following: ? A medicine to help you relax (sedative). ? A medicine to numb the area (local anesthetic). ? A medicine to make you fall asleep (general anesthetic).  If you are getting a transvenous pacemaker: ? An incision will be made in your upper chest. ? A pocket will be made for the pacemaker. It may be placed under the skin or between layers of muscle. ? The lead will be inserted into a blood vessel that returns to the heart. ? While X-rays are taken by an imaging machine (fluoroscopy), the lead will be advanced through the vein to the inside of your heart. ? The other end of the lead will be tunneled under the skin and attached to the pacemaker.  If you are getting an epicardial pacemaker: ? An incision will be made near your ribs or breastbone (sternum) for the lead. ? The lead will be attached to the outside of your heart. ? Another incision will be made in your chest or upper belly to create a pocket for the  pacemaker. ? The free end of the lead will be tunneled under the skin and attached to the pacemaker.  The transvenous or epicardial pacemaker will be tested. Imaging studies may be done to check the lead position.  The incisions will be closed with stitches (sutures), adhesive strips, or skin glue.  Bandages (dressing) will be placed over the incisions. The procedure may vary among health care providers and hospitals. What happens after the procedure?  Your blood pressure, heart rate, breathing rate, and blood oxygen level will be monitored until the medicines you were given have worn off.  You will be given antibiotics and pain medicine.  ECG and chest x-rays will be done.  You will wear a continuous type of ECG (Holter monitor) to check your heart rhythm.  Your health care provider will program the pacemaker.  Do not drive for 24 hours if you received a sedative. This information is not intended to replace advice given to you by your health care provider. Make sure you discuss any questions you have with your health care provider. Document Released: 05/07/2002  Document Revised: 02/03/2018 Document Reviewed: 10/29/2015 Elsevier Patient Education  [place].

## 2019-05-07 NOTE — Progress Notes (Signed)
HPI [name] returns today for evaluation of sinus node dysfunction and palpitations. He is a pleasant 68 yo man with CAD, s/p PCI. He has a h/o HTN and a long h/o a slow HR. He has not had syncope. He can get his HR up to 90 with extreme exertion. He denies chest pain or sob. No anginal symptoms. He has gained 10 lbs in the past 6 months. He wore a cardiac monitor which demonstrated HR's in the 30's and 40's with an ave of 47 bpm. He denies sob. He has PVC's, NS AT, and marked sinus bradycardia. No heart block.  Allergies  Allergen Reactions  . Niacin And Related Other (See Comments)    Flushing   . Tagamet [Cimetidine] Other (See Comments)    unknown     Current Outpatient Medications  Medication Sig Dispense Refill  . Acetaminophen (TYLENOL PO) Take 1 tablet by mouth as needed.    Marland Kitchen aspirin EC 81 MG tablet Take 81 mg by mouth daily.    Marland Kitchen atorvastatin (LIPITOR) 40 MG tablet TAKE 1 TABLET BY MOUTH  DAILY 90 tablet 3  . Cholecalciferol (VITAMIN D3) 2000 units TABS Take 2,000 Units by mouth daily.    . fexofenadine (ALLEGRA) 180 MG tablet Take 180 mg by mouth daily.    . fluvoxaMINE (LUVOX) 50 MG tablet Take 200 mg by mouth every morning.     . furosemide (LASIX) 20 MG tablet TAKE 1 TABLET BY MOUTH  DAILY AS NEEDED FOR FLUID  OR EDEMA 90 tablet 3  . gemfibrozil (LOPID) 600 MG tablet Take 600 mg by mouth 2 (two) times daily.     . Ibuprofen (ADVIL PO) Take 1 tablet by mouth as needed.    . latanoprost (XALATAN) 0.005 % ophthalmic solution Place 1 drop into both eyes at bedtime.     Marland Kitchen levothyroxine (SYNTHROID, LEVOTHROID) 175 MCG tablet Take 175 mcg by mouth daily before breakfast.    . lisinopril (ZESTRIL) 5 MG tablet TAKE 1 TABLET BY MOUTH  DAILY 90 tablet 3  . Multiple Vitamin (MULTIVITAMIN) capsule Take 1 capsule by mouth daily.    Marland Kitchen MYRBETRIQ 50 MG TB24 tablet Take 50 mg by mouth daily.    . nitroGLYCERIN (NITROSTAT) 0.4 MG SL tablet Place 1 tablet (0.4 mg total) under the  tongue every 5 (five) minutes as needed for chest pain. 25 tablet 3  . pantoprazole (PROTONIX) 40 MG tablet TAKE 2 TABLETS BY MOUTH  DAILY 180 tablet 3  . zinc gluconate 50 MG tablet Take 50 mg by mouth daily.     No current facility-administered medications for this visit.      Past Medical History:  Diagnosis Date  . Allergic rhinitis   . Arthritis    "a little bit in some of my joints" (06/20/2014)  . Chronic diastolic CHF (congestive heart failure) ([place])   . Coronary artery disease    a. 05/2014 Cath/PCI: LM nl, LAD 40p, 60-70d (FFR 0.80->3.0x24 Synergy DES), D2 40, LCX 90d (2.25x12 Synergy DES), RCA 20-75m, EF 60-65%. b. Cath 02/2017 without acute change, elev LVEDP suggestive of dCHF.  Marland Kitchen Depression   . Dilated aortic root (HCC)    aortic root 54mm and ascending aorta 69mm by echo 10/2016  . ED (erectile dysfunction)   . Environmental allergies    dry cough  . GERD (gastroesophageal reflux disease)   . History of hiatal hernia   . Hydronephrosis of right kidney    a.  s/p ureteral stenting in the past.  . Hyperlipidemia    under control  . Hypertension   . Hypothyroidism   . OCD (obsessive compulsive disorder)   . Panic disorder   . PONV (postoperative nausea and vomiting)    after thyroid surgery no problems in 10 years   . Prostate cancer [facility])    prostate cancer- robotic prostatectomy - followed by radiation because of positive lymph node--and pt on lupron injections  . Sinus bradycardia    a. chronic, asymptomatic.  24 hour Holter 10/2016 showed Sinus bradycardia, sinus arrhythmia and normal sinus rhythm with average heart rate 50bpm and heart rate ranged from 32 to 100bpm.  . SUI (stress urinary incontinence), male    S/P ROBOTIC PROSTATECTOMY AND RADIATION TX    ROS:   All systems reviewed and negative except as noted in the HPI.   Past Surgical History:  Procedure Laterality Date  . COLONOSCOPY  07/2013   Dr. Oletta Lamas  . CORONARY ANGIOPLASTY WITH STENT  PLACEMENT  06/20/2014   "2"  . CYSTOSCOPY  11/30/2011   Procedure: CYSTOSCOPY;  Surgeon: Reece Packer, MD;  Location: [facility];  Service: Urology;  Laterality: N/A;  Inplantation of Artificial Sphincter and Cystoscopy  . CYSTOSCOPY WITH RETROGRADE PYELOGRAM, URETEROSCOPY AND STENT PLACEMENT Right 02/11/2014   Procedure: CYSTOSCOPY WITH RETROGRADE PYELOGRAM, URETEROSCOPY ,URETERAL BIOPSY AND STENT PLACEMENT;  Surgeon: Raynelle Bring, MD;  Location: [facility];  Service: Urology;  Laterality: Right;  . CYSTOSCOPY WITH RETROGRADE PYELOGRAM, URETEROSCOPY AND STENT PLACEMENT Right 04/15/2014   Procedure: CYSTOSCOPY WITH RETROGRADE PYELOGRAM, URETEROSCOPY, BIOPSY AND STENT PLACEMENT;  Surgeon: Raynelle Bring, MD;  Location: [facility];  Service: Urology;  Laterality: Right;  . LEFT HEART CATH AND CORONARY ANGIOGRAPHY N/A 03/29/2017   Procedure: LEFT HEART CATH AND CORONARY ANGIOGRAPHY;  Surgeon: Leonie Man, MD;  Location: [place] [facility];  Service: Cardiovascular;  Laterality: N/A;  . LEFT HEART CATHETERIZATION WITH CORONARY ANGIOGRAM N/A 06/20/2014   Procedure: LEFT HEART CATHETERIZATION WITH CORONARY ANGIOGRAM;  Surgeon: Leonie Man, MD;  Location: [facility] CATH LAB;  Service: Cardiovascular;  Laterality: N/A;  . PERCUTANEOUS CORONARY STENT INTERVENTION (PCI-S)  06/20/2014   Procedure: PERCUTANEOUS CORONARY STENT INTERVENTION (PCI-S);  Surgeon: Leonie Man, MD;  Location: [facility] CATH LAB;  Service: Cardiovascular;;  LAD and Circumflex  . REFRACTIVE SURGERY Bilateral 2004  . ROBOT ASSISTED LAPAROSCOPIC RADICAL PROSTATECTOMY  05/2010  . THYROIDECTOMY, PARTIAL  10/1974  . TONSILLECTOMY  1956   . TOTAL THYROIDECTOMY  10/2012   "nuked it"  . UPPER GI ENDOSCOPY     food impaction 2009 done by Dr Oletta Lamas  . URINARY SPHINCTER IMPLANT  11/30/2011   Procedure: ARTIFICIAL URINARY SPHINCTER;  Surgeon: Reece Packer, MD;  Location: [facility];  Service: Urology;  Laterality: N/A;     Family History  Problem  Relation Age of Onset  . Cancer Father   . Hyperlipidemia Father   . Heart disease Father        Father had CABG/aorta repair by the time he was in his 3s  . CAD Brother        Brother who is 41 years younger has had some sort of stenting     Social History   Socioeconomic History  . Marital status: Married    Spouse name: Not on file  . Number of children: Not on file  . Years of education: Not on file  . Highest education level: Not on file  Occupational History  .  Not on file  Social Needs  . Financial resource strain: Not on file  . Food insecurity    Worry: Not on file    Inability: Not on file  . Transportation needs    Medical: Not on file    Non-medical: Not on file  Tobacco Use  . Smoking status: Never Smoker  . Smokeless tobacco: Never Used  Substance and Sexual Activity  . Alcohol use: Yes    Alcohol/week: 0.0 standard drinks    Comment: 06/20/2014 "1-2 drinks a couple times/month"  . Drug use: No  . Sexual activity: Yes  Lifestyle  . Physical activity    Days per week: Not on file    Minutes per session: Not on file  . Stress: Not on file  Relationships  . Social Herbalist on phone: Not on file    Gets together: Not on file    Attends religious service: Not on file    Active member of club or organization: Not on file    Attends meetings of clubs or organizations: Not on file    Relationship status: Not on file  . Intimate partner violence    Fear of current or ex partner: Not on file    Emotionally abused: Not on file    Physically abused: Not on file    Forced sexual activity: Not on file  Other Topics Concern  . Not on file  Social History Narrative  . Not on file     BP (!) 150/88   Pulse (!) 50   Ht 5\' 11"  (1.803 m)   Wt 263 lb (119.3 kg)   SpO2 97%   BMI 36.68 kg/m   Physical Exam:  Well appearing NAD HEENT: Unremarkable Neck:  6 cm JVD, no thyromegally Lymphatics:  No adenopathy Back:  No CVA tenderness Lungs:   Clear with no wheezes HEART:  Regular rate rhythm, no murmurs, no rubs, no clicks Abd:  soft, positive bowel sounds, no organomegally, no rebound, no guarding Ext:  2 plus pulses, no edema, no cyanosis, no clubbing Skin:  No rashes no nodules Neuro:  CN II through XII intact, motor grossly intact   Cardiac monitor - reviewed  Assess/Plan: 1. Sinus node dysfunction - he has this along with chronotropic incompetence. In addition, he has symptomatic atrial and ventricular arrhythmias that cannot be treated due to the sinus node dysfunction. For all of the above, I have recommended insertion of PPM. I have reviewed the risks/benefits/goals/expectations of the procedure and he is willing to proceed. 2. HTN - his bp is up. I would anticipate initiation of a beta blocker after his PPM is inserted.  3. CAD - he denies anginal symptoms. He will continue ASA. 4. Dyslipidemia - he will continue his statin therapy.  Mikle Bosworth.D.

## 2019-05-09 ENCOUNTER — Telehealth: Payer: Medicare Other | Admitting: Physician Assistant

## 2019-05-15 ENCOUNTER — Other Ambulatory Visit: Payer: Self-pay | Admitting: Cardiology

## 2019-05-18 ENCOUNTER — Other Ambulatory Visit (HOSPITAL_COMMUNITY)
Admission: RE | Admit: 2019-05-18 | Discharge: 2019-05-18 | Disposition: A | Discharge status: Home / Self Care | Payer: Medicare Other | Source: Ambulatory Visit | Attending: Internal Medicine | Admitting: Internal Medicine

## 2019-05-18 DIAGNOSIS — Z01812 Encounter for preprocedural laboratory examination: Secondary | ICD-10-CM | POA: Insufficient documentation

## 2019-05-18 DIAGNOSIS — Z20828 Contact with and (suspected) exposure to other viral communicable diseases: Secondary | ICD-10-CM | POA: Insufficient documentation

## 2019-05-18 LAB — SARS CORONAVIRUS 2 (TAT 6-24 HRS): SARS Coronavirus 2: NEGATIVE

## 2019-05-21 ENCOUNTER — Ambulatory Visit (HOSPITAL_COMMUNITY)
Admission: RE | Admit: 2019-05-21 | Discharge: 2019-05-21 | Disposition: A | Discharge status: Home / Self Care | Payer: Medicare Other | Attending: Internal Medicine | Admitting: Internal Medicine

## 2019-05-21 ENCOUNTER — Ambulatory Visit (HOSPITAL_COMMUNITY)
Admission: RE | Disposition: A | Discharge status: Home / Self Care | Payer: Self-pay | Source: Home / Self Care | Attending: Internal Medicine

## 2019-05-21 ENCOUNTER — Ambulatory Visit (HOSPITAL_COMMUNITY): Payer: Medicare Other

## 2019-05-21 ENCOUNTER — Other Ambulatory Visit: Payer: Self-pay

## 2019-05-21 DIAGNOSIS — Z79899 Other long term (current) drug therapy: Secondary | ICD-10-CM | POA: Insufficient documentation

## 2019-05-21 DIAGNOSIS — I495 Sick sinus syndrome: Secondary | ICD-10-CM | POA: Diagnosis present

## 2019-05-21 DIAGNOSIS — E039 Hypothyroidism, unspecified: Secondary | ICD-10-CM | POA: Insufficient documentation

## 2019-05-21 DIAGNOSIS — Z8546 Personal history of malignant neoplasm of prostate: Secondary | ICD-10-CM | POA: Diagnosis not present

## 2019-05-21 DIAGNOSIS — I2510 Atherosclerotic heart disease of native coronary artery without angina pectoris: Secondary | ICD-10-CM | POA: Insufficient documentation

## 2019-05-21 DIAGNOSIS — I5032 Chronic diastolic (congestive) heart failure: Secondary | ICD-10-CM | POA: Diagnosis not present

## 2019-05-21 DIAGNOSIS — F429 Obsessive-compulsive disorder, unspecified: Secondary | ICD-10-CM | POA: Diagnosis not present

## 2019-05-21 DIAGNOSIS — Z79890 Hormone replacement therapy (postmenopausal): Secondary | ICD-10-CM | POA: Diagnosis not present

## 2019-05-21 DIAGNOSIS — K219 Gastro-esophageal reflux disease without esophagitis: Secondary | ICD-10-CM | POA: Diagnosis not present

## 2019-05-21 DIAGNOSIS — E785 Hyperlipidemia, unspecified: Secondary | ICD-10-CM | POA: Diagnosis not present

## 2019-05-21 DIAGNOSIS — Z7982 Long term (current) use of aspirin: Secondary | ICD-10-CM | POA: Diagnosis not present

## 2019-05-21 DIAGNOSIS — I110 Hypertensive heart disease with heart failure: Secondary | ICD-10-CM | POA: Insufficient documentation

## 2019-05-21 DIAGNOSIS — Z950 Presence of cardiac pacemaker: Secondary | ICD-10-CM

## 2019-05-21 DIAGNOSIS — F329 Major depressive disorder, single episode, unspecified: Secondary | ICD-10-CM | POA: Insufficient documentation

## 2019-05-21 HISTORY — PX: PACEMAKER IMPLANT: EP1218

## 2019-05-21 SURGERY — PACEMAKER IMPLANT

## 2019-05-21 MED ORDER — SODIUM CHLORIDE 0.9 % IV SOLN
80.0000 mg | INTRAVENOUS | Status: AC
  Administered 2019-05-21: 80 mg

## 2019-05-21 MED ORDER — MIDAZOLAM HCL 5 MG/5ML IJ SOLN
INTRAMUSCULAR | Status: DC | PRN
  Administered 2019-05-21: 1 mg via INTRAVENOUS
  Administered 2019-05-21: 2 mg via INTRAVENOUS

## 2019-05-21 MED ORDER — CEFAZOLIN SODIUM-DEXTROSE 2-4 GM/100ML-% IV SOLN
INTRAVENOUS | Status: AC
  Filled 2019-05-21: qty 100

## 2019-05-21 MED ORDER — MIDAZOLAM HCL 5 MG/5ML IJ SOLN
INTRAMUSCULAR | Status: AC
  Filled 2019-05-21: qty 5

## 2019-05-21 MED ORDER — ONDANSETRON HCL 4 MG/2ML IJ SOLN: 4.0000 mg | Freq: Four times a day (QID) | INTRAMUSCULAR | Status: DC | PRN

## 2019-05-21 MED ORDER — SODIUM CHLORIDE 0.9 % IV SOLN
INTRAVENOUS | Status: AC
  Filled 2019-05-21: qty 2

## 2019-05-21 MED ORDER — HEPARIN (PORCINE) IN NACL 1000-0.9 UT/500ML-% IV SOLN
INTRAVENOUS | Status: AC
  Filled 2019-05-21: qty 500

## 2019-05-21 MED ORDER — FENTANYL CITRATE (PF) 100 MCG/2ML IJ SOLN
INTRAMUSCULAR | Status: DC | PRN
  Administered 2019-05-21: 25 ug via INTRAVENOUS
  Administered 2019-05-21: 12.5 ug via INTRAVENOUS

## 2019-05-21 MED ORDER — FENTANYL CITRATE (PF) 100 MCG/2ML IJ SOLN
INTRAMUSCULAR | Status: AC
  Filled 2019-05-21: qty 2

## 2019-05-21 MED ORDER — LIDOCAINE HCL (PF) 1 % IJ SOLN
INTRAMUSCULAR | Status: DC | PRN
  Administered 2019-05-21: 80 mL

## 2019-05-21 MED ORDER — ACETAMINOPHEN 325 MG PO TABS
ORAL_TABLET | ORAL | Status: AC
  Filled 2019-05-21: qty 2

## 2019-05-21 MED ORDER — LIDOCAINE HCL 1 % IJ SOLN
INTRAMUSCULAR | Status: AC
  Filled 2019-05-21: qty 60

## 2019-05-21 MED ORDER — CHLORHEXIDINE GLUCONATE 4 % EX LIQD: 4.0000 "application " | Freq: Once | CUTANEOUS | Status: DC

## 2019-05-21 MED ORDER — HEPARIN (PORCINE) IN NACL 2-0.9 UNITS/ML
INTRAMUSCULAR | Status: AC | PRN
  Administered 2019-05-21: 500 mL

## 2019-05-21 MED ORDER — LIDOCAINE HCL 1 % IJ SOLN
INTRAMUSCULAR | Status: AC
  Filled 2019-05-21: qty 20

## 2019-05-21 MED ORDER — CEFAZOLIN SODIUM-DEXTROSE 2-4 GM/100ML-% IV SOLN
2.0000 g | INTRAVENOUS | Status: AC
  Administered 2019-05-21: 2 g via INTRAVENOUS

## 2019-05-21 MED ORDER — ACETAMINOPHEN 325 MG PO TABS
325.0000 mg | ORAL_TABLET | ORAL | Status: DC | PRN
  Administered 2019-05-21: 650 mg via ORAL

## 2019-05-21 MED ORDER — CEFAZOLIN SODIUM-DEXTROSE 1-4 GM/50ML-% IV SOLN: 1.0000 g | Freq: Four times a day (QID) | INTRAVENOUS | Status: DC

## 2019-05-21 MED ORDER — SODIUM CHLORIDE 0.9 % IV SOLN: INTRAVENOUS | Status: DC

## 2019-05-21 SURGICAL SUPPLY — 7 items
CABLE SURGICAL S-101-97-12 (CABLE) ×2 IMPLANT
LEAD SOLIA S PRO MRI 53 (Lead) ×2 IMPLANT
LEAD SOLIA S PRO MRI 60 (Lead) ×2 IMPLANT
PACEMAKER EDORA 8DR-T MRI (Pacemaker) ×2 IMPLANT
PAD PRO RADIOLUCENT 2001M-C (PAD) ×2 IMPLANT
SHEATH 7FR PRELUDE SNAP 13 (SHEATH) ×4 IMPLANT
TRAY PACEMAKER INSERTION (PACKS) ×2 IMPLANT

## 2019-05-21 NOTE — Interval H&P Note (Signed)
History and Physical Interval Note:  05/21/2019 9:44 AM  Blake Carter  has presented today for surgery, with the diagnosis of Bradicardia.  The various methods of treatment have been discussed with the patient and family. After consideration of risks, benefits and other options for treatment, the patient has consented to  Procedure(s): PACEMAKER IMPLANT (N/A) as a surgical intervention.  The patient's history has been reviewed, patient examined, no change in status, stable for surgery.  I have reviewed the patient's chart and labs.  Questions were answered to the patient's satisfaction.     Cristopher Peru

## 2019-05-21 NOTE — Discharge Instructions (Signed)
    Supplemental Discharge Instructions for  Pacemaker Patients  TOMORROW, 05/22/2019, PLEASE SEND A REMOTE DEVICE TRANSMISSION     Activity No heavy lifting or vigorous activity with your left arm for 6 to 8 weeks.  Do not raise your left arm above your head for one week.  Gradually raise your affected arm as drawn below.              05/25/2019               05/26/2019             05/27/2019            05/28/2019 __  NO DRIVING for 1 week    ; you may begin driving on  [id number]   .  WOUND CARE - Keep the wound area clean and dry.  Do not get this area wet for one week. No showers for one week; you may shower on 05/28/2019   . - Remove the arm sling tomorrow, 05/22/2019 - Remove outer plastic bandage tomorrow, leave the steri-strips (paper tapes) on your wound, do NOT remove these - The tape/steri-strips on your wound will fall off; do not pull them off.  No bandage is needed on the site.  DO  NOT apply any creams, oils, or ointments to the wound area. - If you notice any drainage or discharge from the wound, any swelling or bruising at the site, or you develop a fever > 101? F after you are discharged home, call the office at once.  Special Instructions - You are still able to use cellular telephones; use the ear opposite the side where you have your pacemaker/defibrillator.  Avoid carrying your cellular phone near your device. - When traveling through airports, show security personnel your identification card to avoid being screened in the metal detectors.  Ask the security personnel to use the hand wand. - Avoid arc welding equipment, MRI testing (magnetic resonance imaging), TENS units (transcutaneous nerve stimulators).  Call the office for questions about other devices. - Avoid electrical appliances that are in poor condition or are not properly grounded. - Microwave ovens are safe to be near or to operate.

## 2019-05-21 NOTE — Progress Notes (Signed)
Dr Lovena Le in to see client and ok to discharge

## 2019-05-22 ENCOUNTER — Telehealth: Payer: Self-pay | Admitting: *Deleted

## 2019-05-22 ENCOUNTER — Telehealth (HOSPITAL_COMMUNITY): Payer: Self-pay | Admitting: *Deleted

## 2019-05-22 MED FILL — Cefazolin Sodium-Dextrose IV Solution 2 GM/100ML-4%: INTRAVENOUS | Qty: 100 | Status: AC

## 2019-05-22 MED FILL — Lidocaine HCl Local Inj 1%: INTRAMUSCULAR | Qty: 80 | Status: AC

## 2019-05-22 MED FILL — Gentamicin Sulfate Inj 40 MG/ML: INTRAMUSCULAR | Qty: 80 | Status: AC

## 2019-05-22 NOTE — Telephone Encounter (Signed)
Spoke with patient to advise that PPM home monitor is communicating appropriately, no alerts or abnormalities noted overnight. Patient reports that steri-strips are intact. Advised he can remove sling today and progress activity as per discharge instructions. Patient aware of wound check appointment on 05/31/19. Requested to reschedule lab work to this date, updated to 05/31/19 at 08:15. All questions answered, patient denies additional questions or concerns at this time.

## 2019-05-22 NOTE — Telephone Encounter (Signed)
Contacted patient to update status of return to exercise in the cardiac rehab maintenance program. Patient aware that we will be temporarily suspending onsite maintenance exercise classes and would like to be contacted once we resume.  Sol Passer, MS, ACSM CEP 05/22/2019 1322

## 2019-05-31 ENCOUNTER — Other Ambulatory Visit: Payer: Self-pay

## 2019-05-31 ENCOUNTER — Other Ambulatory Visit: Payer: Medicare Other | Admitting: *Deleted

## 2019-05-31 ENCOUNTER — Ambulatory Visit (INDEPENDENT_AMBULATORY_CARE_PROVIDER_SITE_OTHER): Payer: Medicare Other | Admitting: Student

## 2019-05-31 DIAGNOSIS — E781 Pure hyperglyceridemia: Secondary | ICD-10-CM

## 2019-05-31 DIAGNOSIS — R001 Bradycardia, unspecified: Secondary | ICD-10-CM

## 2019-05-31 LAB — CUP PACEART INCLINIC DEVICE CHECK
Date Time Interrogation Session: 20201231084713
Implantable Lead Implant Date: 20201221
Implantable Lead Implant Date: 20201221
Implantable Lead Location: 753859
Implantable Lead Location: 753860
Implantable Lead Model: 377169
Implantable Lead Model: 377171
Implantable Lead Serial Number: 7000011949
Implantable Lead Serial Number: 81083606
Implantable Pulse Generator Implant Date: 20201221
Lead Channel Impedance Value: 585 Ohm
Lead Channel Impedance Value: 604 Ohm
Lead Channel Pacing Threshold Amplitude: 0.2 V
Lead Channel Pacing Threshold Amplitude: 0.2 V
Lead Channel Pacing Threshold Amplitude: 0.8 V
Lead Channel Pacing Threshold Amplitude: 0.8 V
Lead Channel Pacing Threshold Amplitude: 0.8 V
Lead Channel Pacing Threshold Pulse Width: 0.4 ms
Lead Channel Pacing Threshold Pulse Width: 0.4 ms
Lead Channel Pacing Threshold Pulse Width: 0.4 ms
Lead Channel Pacing Threshold Pulse Width: 0.4 ms
Lead Channel Pacing Threshold Pulse Width: 0.4 ms
Lead Channel Sensing Intrinsic Amplitude: 12.5 mV
Lead Channel Sensing Intrinsic Amplitude: 12.6 mV
Lead Channel Sensing Intrinsic Amplitude: 4 mV
Lead Channel Sensing Intrinsic Amplitude: 4.2 mV
Lead Channel Setting Pacing Amplitude: 1.8 V
Lead Channel Setting Pacing Amplitude: 2.4 V
Lead Channel Setting Pacing Pulse Width: 0.4 ms
Pulse Gen Model: 407145
Pulse Gen Serial Number: 69703841

## 2019-05-31 LAB — LIPID PANEL
Chol/HDL Ratio: 3.7 ratio (ref 0.0–5.0)
Cholesterol, Total: 172 mg/dL (ref 100–199)
HDL: 47 mg/dL (ref >39)
LDL Chol Calc (NIH): 97 mg/dL (ref 0–99)
Triglycerides: 164 mg/dL — ABNORMAL HIGH (ref 0–149)
VLDL Cholesterol Cal: 28 mg/dL (ref 5–40)

## 2019-05-31 LAB — HEPATIC FUNCTION PANEL
ALT: 19 IU/L (ref 0–44)
AST: 18 IU/L (ref 0–40)
Albumin: 5 g/dL — ABNORMAL HIGH (ref 3.8–4.8)
Alkaline Phosphatase: 111 IU/L (ref 39–117)
Bilirubin Total: 0.3 mg/dL (ref 0.0–1.2)
Bilirubin, Direct: 0.09 mg/dL (ref 0.00–0.40)
Total Protein: 7.4 g/dL (ref 6.0–8.5)

## 2019-05-31 NOTE — Progress Notes (Signed)
Wound check appointment. Steri-strips removed. Wound without redness or edema. Incision edges approximated, wound well healed. Normal device function. Thresholds, sensing, and impedances consistent with implant measurements. Device auto capture programmed on. Histogram distribution appropriate for patient and level of activity. No mode switches or high ventricular rates noted. Patient educated about wound care, arm mobility, lifting restrictions. ROV in 3 months with Dr. Lovena Le.

## 2019-06-04 ENCOUNTER — Telehealth: Payer: Self-pay

## 2019-06-04 ENCOUNTER — Other Ambulatory Visit: Payer: Medicare Other

## 2019-06-04 DIAGNOSIS — E782 Mixed hyperlipidemia: Secondary | ICD-10-CM

## 2019-06-04 MED ORDER — ATORVASTATIN CALCIUM 80 MG PO TABS: 80.0000 mg | ORAL_TABLET | Freq: Every day | ORAL | 3 refills | Status: DC

## 2019-06-04 NOTE — Telephone Encounter (Signed)
The patient has been notified of the result and verbalized understanding.  All questions (if any) were answered. Antonieta Iba, RN 06/04/2019 3:42 PM

## 2019-06-04 NOTE — Telephone Encounter (Signed)
-----   Message from Sueanne Margarita, MD sent at 05/31/2019  6:46 PM EST ----- Increase Atorvastatin 80mg  daily and repeat FLP and ALT as LDL is not at goal

## 2019-06-13 ENCOUNTER — Other Ambulatory Visit: Payer: Self-pay | Admitting: Urology

## 2019-06-13 ENCOUNTER — Other Ambulatory Visit (HOSPITAL_COMMUNITY): Payer: Self-pay | Admitting: Urology

## 2019-06-13 DIAGNOSIS — C61 Malignant neoplasm of prostate: Secondary | ICD-10-CM

## 2019-06-28 ENCOUNTER — Other Ambulatory Visit: Payer: Self-pay

## 2019-06-28 ENCOUNTER — Ambulatory Visit (INDEPENDENT_AMBULATORY_CARE_PROVIDER_SITE_OTHER): Payer: Medicare PPO | Admitting: Adult Health

## 2019-06-28 ENCOUNTER — Encounter: Payer: Self-pay | Admitting: Adult Health

## 2019-06-28 VITALS — BP 161/99 | HR 69 | Ht 71.0 in | Wt 250.0 lb

## 2019-06-28 DIAGNOSIS — F411 Generalized anxiety disorder: Secondary | ICD-10-CM | POA: Diagnosis not present

## 2019-06-28 DIAGNOSIS — F429 Obsessive-compulsive disorder, unspecified: Secondary | ICD-10-CM

## 2019-06-28 DIAGNOSIS — F331 Major depressive disorder, recurrent, moderate: Secondary | ICD-10-CM | POA: Diagnosis not present

## 2019-06-28 MED ORDER — BUPROPION HCL ER (XL) 150 MG PO TB24: 150.0000 mg | ORAL_TABLET | Freq: Every day | ORAL | 2 refills | Status: DC

## 2019-06-28 NOTE — Progress Notes (Signed)
[facility] MD/PA/NP Initial Note  06/28/2019 8:46 AM Blake Carter  MRN:  [id number]  Chief Complaint:  Chief Complaint    Anxiety; Depression; Other      HPI:   Describes mood today as "ok". Pleasant. Mood symptoms - reports depression, anxiety, and irritability. Stating "I mostly have good days". Feels like he is "stuck at home" since Covid-19. Afraid to get out with all his "conditions". Has advanced stage prostate cancer. Gets depressed every time he goes in for a check up. Has been fighting it for 10 years. Concerned about implications of 0000000. Wanting to get out and do things and can't, then gets depressed. Varying interest and motivation. Taking medications as prescribed.  Energy levels stable. Active, does not have a regular exercise routine. Walking the dog daily. Retired Forensic psychologist and professor.  Enjoys some usual interests and activities. Married x 36 years. Has 2 children - 1 son and 1 daughter. Spending time with family. Appetite adequate. Weight loss. Sleeps well most nights. Averages 7 to 8 hours. Can take a while to get to sleep some nights.  Focus and concentration stable. Completing tasks. Managing aspects of household. Painting. Doing things around the house. Denies SI or HI. Denies AH or VH.  Previous medication trials: Luvox, Anafranil  Visit Diagnosis:    ICD-10-CM   1. Obsessive-compulsive disorder, unspecified type  F42.9   2. Generalized anxiety disorder  F41.1   3. Major depressive disorder, recurrent episode, moderate (HCC)  F33.1     Past Psychiatric History: Denies psychiatric hospitalization.  Past Medical History:  Past Medical History:  Diagnosis Date  . Allergic rhinitis   . Arthritis    "a little bit in some of my joints" (06/20/2014)  . Chronic diastolic CHF (congestive heart failure) ([place])   . Coronary artery disease    a. 05/2014 Cath/PCI: LM nl, LAD 40p, 60-70d (FFR 0.80->3.0x24 Synergy DES), D2 40, LCX 90d (2.25x12 Synergy DES), RCA  20-72m, EF 60-65%. b. Cath 02/2017 without acute change, elev LVEDP suggestive of dCHF.  Marland Kitchen Depression   . Dilated aortic root (HCC)    aortic root 43mm and ascending aorta 49mm by echo 10/2016  . ED (erectile dysfunction)   . Environmental allergies    dry cough  . GERD (gastroesophageal reflux disease)   . History of hiatal hernia   . Hydronephrosis of right kidney    a. s/p ureteral stenting in the past.  . Hyperlipidemia    under control  . Hypertension   . Hypothyroidism   . OCD (obsessive compulsive disorder)   . Panic disorder   . PONV (postoperative nausea and vomiting)    after thyroid surgery no problems in 10 years   . Prostate cancer [facility])    prostate cancer- robotic prostatectomy - followed by radiation because of positive lymph node--and pt on lupron injections  . Sinus bradycardia    a. chronic, asymptomatic.  24 hour Holter 10/2016 showed Sinus bradycardia, sinus arrhythmia and normal sinus rhythm with average heart rate 50bpm and heart rate ranged from 32 to 100bpm.  . SUI (stress urinary incontinence), male    S/P ROBOTIC PROSTATECTOMY AND RADIATION TX    Past Surgical History:  Procedure Laterality Date  . COLONOSCOPY  07/2013   Dr. Oletta Lamas  . CORONARY ANGIOPLASTY WITH STENT PLACEMENT  06/20/2014   "2"  . CYSTOSCOPY  11/30/2011   Procedure: CYSTOSCOPY;  Surgeon: Reece Packer, MD;  Location: [facility];  Service: Urology;  Laterality: N/A;  Inplantation of Artificial  Sphincter and Cystoscopy  . CYSTOSCOPY WITH RETROGRADE PYELOGRAM, URETEROSCOPY AND STENT PLACEMENT Right 02/11/2014   Procedure: CYSTOSCOPY WITH RETROGRADE PYELOGRAM, URETEROSCOPY ,URETERAL BIOPSY AND STENT PLACEMENT;  Surgeon: Raynelle Bring, MD;  Location: [facility];  Service: Urology;  Laterality: Right;  . CYSTOSCOPY WITH RETROGRADE PYELOGRAM, URETEROSCOPY AND STENT PLACEMENT Right 04/15/2014   Procedure: CYSTOSCOPY WITH RETROGRADE PYELOGRAM, URETEROSCOPY, BIOPSY AND STENT PLACEMENT;  Surgeon: Raynelle Bring, MD;  [facility]: [facility];  Service: Urology;  Laterality: Right;  . LEFT HEART CATH AND CORONARY ANGIOGRAPHY N/A 03/29/2017   Procedure: LEFT HEART CATH AND CORONARY ANGIOGRAPHY;  Surgeon: Leonie Man, MD;  Location: [place] [facility];  Service: Cardiovascular;  Laterality: N/A;  . LEFT HEART CATHETERIZATION WITH CORONARY ANGIOGRAM N/A 06/20/2014   Procedure: LEFT HEART CATHETERIZATION WITH CORONARY ANGIOGRAM;  Surgeon: Leonie Man, MD;  Location: [facility] CATH LAB;  Service: Cardiovascular;  Laterality: N/A;  . PACEMAKER IMPLANT N/A 05/21/2019   Procedure: PACEMAKER IMPLANT;  Surgeon: Evans Lance, MD;  Location: [place] [facility];  Service: Cardiovascular;  Laterality: N/A;  . PERCUTANEOUS CORONARY STENT INTERVENTION (PCI-S)  06/20/2014   Procedure: PERCUTANEOUS CORONARY STENT INTERVENTION (PCI-S);  Surgeon: Leonie Man, MD;  Location: [facility] CATH LAB;  Service: Cardiovascular;;  LAD and Circumflex  . REFRACTIVE SURGERY Bilateral 2004  . ROBOT ASSISTED LAPAROSCOPIC RADICAL PROSTATECTOMY  05/2010  . THYROIDECTOMY, PARTIAL  10/1974  . TONSILLECTOMY  1956   . TOTAL THYROIDECTOMY  10/2012   "nuked it"  . UPPER GI ENDOSCOPY     food impaction 2009 done by Dr Oletta Lamas  . URINARY SPHINCTER IMPLANT  11/30/2011   Procedure: ARTIFICIAL URINARY SPHINCTER;  Surgeon: Reece Packer, MD;  [facility];  Service: Urology;  Laterality: N/A;    Family Psychiatric History: Denies any family history of mental illness.  Family History:  Family History  Problem Relation Age of Onset  . Cancer Father   . Hyperlipidemia Father   . Heart disease Father        Father had CABG/aorta repair by the time he was in his 78s  . CAD Brother        Brother who is 69 years younger has had some sort of stenting    Social History:  Social History   Socioeconomic History  . Marital status: Married    Spouse name: Not on file  . Number of children: Not on file  . Years of education: Not on file  .  Highest education level: Not on file  Occupational History  . Not on file  Tobacco Use  . Smoking status: Never Smoker  . Smokeless tobacco: Never Used  Substance and Sexual Activity  . Alcohol use: Yes    Alcohol/week: 0.0 standard drinks    Comment: 06/20/2014 "1-2 drinks a couple times/month"  . Drug use: No  . Sexual activity: Yes  Other Topics Concern  . Not on file  Social History Narrative  . Not on file   Social Determinants of Health   Financial Resource Strain:   . Difficulty of Paying Living Expenses: Not on file  Food Insecurity:   . Worried About Charity fundraiser in the Last Year: Not on file  . Ran Out of Food in the Last Year: Not on file  Transportation Needs:   . Lack of Transportation (Medical): Not on file  . Lack of Transportation (Non-Medical): Not on file  Physical Activity:   . Days of Exercise per Week: Not  on file  . Minutes of Exercise per Session: Not on file  Stress:   . Feeling of Stress : Not on file  Social Connections:   . Frequency of Communication with Friends and Family: Not on file  . Frequency of Social Gatherings with Friends and Family: Not on file  . Attends Religious Services: Not on file  . Active Member of Clubs or Organizations: Not on file  . Attends Archivist Meetings: Not on file  . Marital Status: Not on file    Allergies:  Allergies  Allergen Reactions  . Niacin And Related Other (See Comments)    Flushing   . Tagamet [Cimetidine] Other (See Comments)    unknown    Metabolic Disorder Labs: Lab Results  Component Value Date   HGBA1C 5.9 (H) 12/13/2017   MPG 119.76 03/28/2017   No results found for: PROLACTIN Lab Results  Component Value Date   CHOL 172 05/31/2019   TRIG 164 (H) 05/31/2019   HDL 47 05/31/2019   CHOLHDL 3.7 05/31/2019   VLDL 34 03/29/2017   LDLCALC 97 05/31/2019   LDLCALC 68 01/30/2019   Lab Results  Component Value Date   TSH 0.468 03/28/2019   TSH 1.450 12/13/2017     Therapeutic Level Labs: No results found for: LITHIUM No results found for: VALPROATE No components found for:  CBMZ  Current Medications: Current Outpatient Medications  Medication Sig Dispense Refill  . acetaminophen (TYLENOL) 500 MG tablet Take 1,000 mg by mouth every 6 (six) hours as needed (pain).    . Ascorbic Acid (VITAMIN C) 1000 MG tablet     . aspirin EC 81 MG tablet Take 81 mg by mouth daily.    Marland Kitchen atorvastatin (LIPITOR) 80 MG tablet Take 1 tablet (80 mg total) by mouth daily. 90 tablet 3  . buPROPion (WELLBUTRIN XL) 150 MG 24 hr tablet Take 1 tablet (150 mg total) by mouth daily. 30 tablet 2  . Cholecalciferol (VITAMIN D3) 2000 units TABS Take 2,000 Units by mouth daily.    . fexofenadine (ALLEGRA) 180 MG tablet Take 180 mg by mouth daily.    . fluvoxaMINE (LUVOX) 50 MG tablet Take 200 mg by mouth every morning.     . furosemide (LASIX) 20 MG tablet TAKE 1 TABLET BY MOUTH  DAILY AS NEEDED FOR FLUID  OR EDEMA (Patient taking differently: Take 20 mg by mouth daily as needed for edema. ) 90 tablet 3  . gemfibrozil (LOPID) 600 MG tablet Take 600 mg by mouth 2 (two) times daily.     Marland Kitchen ibuprofen (ADVIL) 200 MG tablet Take 400-800 mg by mouth every 8 (eight) hours as needed for moderate pain.     Marland Kitchen latanoprost (XALATAN) 0.005 % ophthalmic solution Place 1 drop into both eyes at bedtime.     Marland Kitchen levothyroxine (SYNTHROID, LEVOTHROID) 175 MCG tablet Take 175 mcg by mouth daily before breakfast.    . lisinopril (ZESTRIL) 5 MG tablet TAKE 1 TABLET BY MOUTH  DAILY 90 tablet 3  . Multiple Vitamin (MULTIVITAMIN) capsule Take 1 capsule by mouth daily.    Marland Kitchen MYRBETRIQ 50 MG TB24 tablet Take 50 mg by mouth daily.    . nitroGLYCERIN (NITROSTAT) 0.4 MG SL tablet PLACE 1 TABLET (0.4 MG TOTAL) UNDER THE TONGUE EVERY 5 (FIVE) MINUTES AS NEEDED FOR CHEST PAIN. 25 tablet 3  . pantoprazole (PROTONIX) 40 MG tablet TAKE 2 TABLETS BY MOUTH  DAILY 180 tablet 3  . zinc gluconate 50 MG tablet  Take 50 mg by  mouth daily.     No current facility-administered medications for this visit.    Medication Side Effects: none  Orders placed this visit:  No orders of the defined types were placed in this encounter.   Psychiatric Specialty Exam:  Review of Systems  There were no vitals taken for this visit.There is no height or weight on file to calculate BMI.  General Appearance: Neat and Well Groomed  Eye Contact:  Good  Speech:  Clear and Coherent and Normal Rate  Volume:  Normal  Mood:  Anxious and Depressed  Affect:  Appropriate and Congruent  Thought Process:  Coherent  Orientation:  Full (Time, Place, and Person)  Thought Content: Logical   Suicidal Thoughts:  No  Homicidal Thoughts:  No  Memory:  WNL  Judgement:  Good  Insight:  Good  Psychomotor Activity:  Normal  Concentration:  Concentration: Good  Recall:  Good  Fund of Knowledge: Good  Language: Good  Assets:  Communication Skills Desire for Improvement Financial Resources/Insurance Housing Intimacy Leisure Time Physical Health Resilience Social Support Talents/Skills Transportation Vocational/Educational  ADL's:  Intact  Cognition: WNL  Prognosis:  Good   Screenings:  PHQ2-9     CARDIAC REHAB PHASE II EXERCISE from 10/02/2014 in [place] from 07/08/2014 in [place]  PHQ-2 Total Score  0  0      Receiving Psychotherapy: No   Treatment Plan/Recommendations:   Plan:  PDMP reviewed  1. Continue Luvox 300mg  daily 2. Wellbutrin XL 150mg  daily   Consider Buspar  Consider therapy  RTC 4 weeks  Patient advised to contact office with any questions, adverse effects, or acute worsening in signs and symptoms.   Aloha Gell, NP

## 2019-07-16 ENCOUNTER — Other Ambulatory Visit: Payer: Self-pay

## 2019-07-16 ENCOUNTER — Other Ambulatory Visit: Payer: Medicare PPO

## 2019-07-16 DIAGNOSIS — E782 Mixed hyperlipidemia: Secondary | ICD-10-CM

## 2019-07-16 LAB — LIPID PANEL
Chol/HDL Ratio: 3.4 ratio (ref 0.0–5.0)
Cholesterol, Total: 157 mg/dL (ref 100–199)
HDL: 46 mg/dL (ref >39)
LDL Chol Calc (NIH): 89 mg/dL (ref 0–99)
Triglycerides: 124 mg/dL (ref 0–149)
VLDL Cholesterol Cal: 22 mg/dL (ref 5–40)

## 2019-07-16 LAB — ALT: ALT: 24 IU/L (ref 0–44)

## 2019-07-20 ENCOUNTER — Other Ambulatory Visit: Payer: Self-pay | Admitting: Adult Health

## 2019-07-26 ENCOUNTER — Encounter: Payer: Self-pay | Admitting: Adult Health

## 2019-07-26 ENCOUNTER — Ambulatory Visit (INDEPENDENT_AMBULATORY_CARE_PROVIDER_SITE_OTHER): Payer: Medicare PPO | Admitting: Adult Health

## 2019-07-26 DIAGNOSIS — F411 Generalized anxiety disorder: Secondary | ICD-10-CM | POA: Diagnosis not present

## 2019-07-26 DIAGNOSIS — F429 Obsessive-compulsive disorder, unspecified: Secondary | ICD-10-CM

## 2019-07-26 DIAGNOSIS — F331 Major depressive disorder, recurrent, moderate: Secondary | ICD-10-CM

## 2019-07-26 MED ORDER — BUPROPION HCL ER (XL) 150 MG PO TB24: 150.0000 mg | ORAL_TABLET | Freq: Every day | ORAL | 1 refills | Status: DC

## 2019-07-26 NOTE — Progress Notes (Signed)
Aurion Risi [id number] 01-29-1951 69 y.o.  Virtual Visit via Telephone Note  I connected with pt on 07/26/19 at  9:20 AM EST by telephone and verified that I am speaking with the correct person using two identifiers.   I discussed the limitations, risks, security and privacy concerns of performing an evaluation and management service by telephone and the availability of in person appointments. I also discussed with the patient that there may be a patient responsible charge related to this service. The patient expressed understanding and agreed to proceed.   I discussed the assessment and treatment plan with the patient. The patient was provided an opportunity to ask questions and all were answered. The patient agreed with the plan and demonstrated an understanding of the instructions.   The patient was advised to call back or seek an in-person evaluation if the symptoms worsen or if the condition fails to improve as anticipated.  I provided 30 minutes of non-face-to-face time during this encounter.  The patient was located at home.  The provider was located at [place].   Aloha Gell, NP   Subjective:   Patient ID:  Blake Carter is a 69 y.o. (DOB [birth year]) male.  Chief Complaint:  Chief Complaint  Patient presents with  . Anxiety  . Depression  . Other    OCD    HPI Blake Carter presents for follow-up of MDD, GAD, OCD   Describes mood today as "ok". Pleasant. Mood symptoms - denies depression, anxiety, and irritability. Reports feeling "good". Stating "If anything starts to bother me, I can work through it". Has gotten both Covid-19 vaccines. Hoping to start getting out more. Plans to return to church this week. Seeing Urologist in April - stating "that will be the big test".  Wife has noticed a "big" difference. Decreased worry and ruminations. Improved  interest and motivation. Taking medications as prescribed.  Energy levels stable. Active,  does not have a regular exercise routine. Walking dog daily. Retired Forensic psychologist and professor.  Enjoys some usual interests and activities. Married. Lives with wife of  55 years. Has 2 children - 1 son and 1 daughter. Spending time with family. Appetite adequate. Weight stable. Sleeps well most nights. Averages 7 to 8 hours. Has a "tough time falling asleep some some nights".  Focus and concentration stable. Completing tasks. Managing aspects of household. Working around the house. Denies SI or HI. Denies AH or VH.   Review of Systems:  Review of Systems  Musculoskeletal: Negative for gait problem.  Neurological: Negative for tremors.  Psychiatric/Behavioral:       Please refer to HPI    Medications: I have reviewed the patient's current medications.  Current Outpatient Medications  Medication Sig Dispense Refill  . acetaminophen (TYLENOL) 500 MG tablet Take 1,000 mg by mouth every 6 (six) hours as needed (pain).    . Ascorbic Acid (VITAMIN C) 1000 MG tablet     . aspirin EC 81 MG tablet Take 81 mg by mouth daily.    Marland Kitchen atorvastatin (LIPITOR) 80 MG tablet Take 1 tablet (80 mg total) by mouth daily. 90 tablet 3  . buPROPion (WELLBUTRIN XL) 150 MG 24 hr tablet Take 1 tablet (150 mg total) by mouth daily. 90 tablet 1  . Cholecalciferol (VITAMIN D3) 2000 units TABS Take 2,000 Units by mouth daily.    . fexofenadine (ALLEGRA) 180 MG tablet Take 180 mg by mouth daily.    . fluvoxaMINE (LUVOX) 50 MG tablet Take 200 mg by mouth  every morning.     . furosemide (LASIX) 20 MG tablet TAKE 1 TABLET BY MOUTH  DAILY AS NEEDED FOR FLUID  OR EDEMA (Patient taking differently: Take 20 mg by mouth daily as needed for edema. ) 90 tablet 3  . gemfibrozil (LOPID) 600 MG tablet Take 600 mg by mouth 2 (two) times daily.     Marland Kitchen ibuprofen (ADVIL) 200 MG tablet Take 400-800 mg by mouth every 8 (eight) hours as needed for moderate pain.     Marland Kitchen latanoprost (XALATAN) 0.005 % ophthalmic solution Place 1 drop into both  eyes at bedtime.     Marland Kitchen levothyroxine (SYNTHROID) 200 MCG tablet     . levothyroxine (SYNTHROID, LEVOTHROID) 175 MCG tablet Take 175 mcg by mouth daily before breakfast.    . lisinopril (ZESTRIL) 5 MG tablet TAKE 1 TABLET BY MOUTH  DAILY 90 tablet 3  . Multiple Vitamin (MULTIVITAMIN) capsule Take 1 capsule by mouth daily.    Marland Kitchen MYRBETRIQ 50 MG TB24 tablet Take 50 mg by mouth daily.    . nitroGLYCERIN (NITROSTAT) 0.4 MG SL tablet PLACE 1 TABLET (0.4 MG TOTAL) UNDER THE TONGUE EVERY 5 (FIVE) MINUTES AS NEEDED FOR CHEST PAIN. 25 tablet 3  . pantoprazole (PROTONIX) 40 MG tablet TAKE 2 TABLETS BY MOUTH  DAILY 180 tablet 3  . zinc gluconate 50 MG tablet Take 50 mg by mouth daily.     No current facility-administered medications for this visit.    Medication Side Effects: None  Allergies:  Allergies  Allergen Reactions  . Niacin And Related Other (See Comments)    Flushing   . Tagamet [Cimetidine] Other (See Comments)    unknown    Past Medical History:  Diagnosis Date  . Allergic rhinitis   . Arthritis    "a little bit in some of my joints" (06/20/2014)  . Chronic diastolic CHF (congestive heart failure) ([place])   . Coronary artery disease    a. 05/2014 Cath/PCI: LM nl, LAD 40p, 60-70d (FFR 0.80->3.0x24 Synergy DES), D2 40, LCX 90d (2.25x12 Synergy DES), RCA 20-11m, EF 60-65%. b. Cath 02/2017 without acute change, elev LVEDP suggestive of dCHF.  Marland Kitchen Depression   . Dilated aortic root (HCC)    aortic root 68mm and ascending aorta 53mm by echo 10/2016  . ED (erectile dysfunction)   . Environmental allergies    dry cough  . GERD (gastroesophageal reflux disease)   . History of hiatal hernia   . Hydronephrosis of right kidney    a. s/p ureteral stenting in the past.  . Hyperlipidemia    under control  . Hypertension   . Hypothyroidism   . OCD (obsessive compulsive disorder)   . Panic disorder   . PONV (postoperative nausea and vomiting)    after thyroid surgery no problems in 10 years    . Prostate cancer [facility])    prostate cancer- robotic prostatectomy - followed by radiation because of positive lymph node--and pt on lupron injections  . Sinus bradycardia    a. chronic, asymptomatic.  24 hour Holter 10/2016 showed Sinus bradycardia, sinus arrhythmia and normal sinus rhythm with average heart rate 50bpm and heart rate ranged from 32 to 100bpm.  . SUI (stress urinary incontinence), male    S/P ROBOTIC PROSTATECTOMY AND RADIATION TX    Family History  Problem Relation Age of Onset  . Cancer Father   . Hyperlipidemia Father   . Heart disease Father        Father had CABG/aorta repair  by the time he was in his 47s  . CAD Brother        Brother who is 91 years younger has had some sort of stenting    Social History   Socioeconomic History  . Marital status: Married    Spouse name: Not on file  . Number of children: Not on file  . Years of education: Not on file  . Highest education level: Not on file  Occupational History  . Not on file  Tobacco Use  . Smoking status: Never Smoker  . Smokeless tobacco: Never Used  Substance and Sexual Activity  . Alcohol use: Yes    Alcohol/week: 0.0 standard drinks    Comment: 06/20/2014 "1-2 drinks a couple times/month"  . Drug use: No  . Sexual activity: Yes  Other Topics Concern  . Not on file  Social History Narrative  . Not on file   Social Determinants of Health   Financial Resource Strain:   . Difficulty of Paying Living Expenses: Not on file  Food Insecurity:   . Worried About Charity fundraiser in the Last Year: Not on file  . Ran Out of Food in the Last Year: Not on file  Transportation Needs:   . Lack of Transportation (Medical): Not on file  . Lack of Transportation (Non-Medical): Not on file  Physical Activity:   . Days of Exercise per Week: Not on file  . Minutes of Exercise per Session: Not on file  Stress:   . Feeling of Stress : Not on file  Social Connections:   . Frequency of Communication with  Friends and Family: Not on file  . Frequency of Social Gatherings with Friends and Family: Not on file  . Attends Religious Services: Not on file  . Active Member of Clubs or Organizations: Not on file  . Attends Archivist Meetings: Not on file  . Marital Status: Not on file  Intimate Partner Violence:   . Fear of Current or Ex-Partner: Not on file  . Emotionally Abused: Not on file  . Physically Abused: Not on file  . Sexually Abused: Not on file    Past Medical History, Surgical history, Social history, and Family history were reviewed and updated as appropriate.   Please see review of systems for further details on the patient's review from today.   Objective:   Physical Exam:  There were no vitals taken for this visit.  Physical Exam Constitutional:      General: He is not in acute distress.    Appearance: He is well-developed.  Musculoskeletal:        General: No deformity.  Neurological:     Mental Status: He is alert and oriented to person, place, and time.     Coordination: Coordination normal.  Psychiatric:        Attention and Perception: Attention and perception normal. He does not perceive auditory or visual hallucinations.        Mood and Affect: Mood normal. Mood is not anxious or depressed. Affect is not labile, blunt, angry or inappropriate.        Speech: Speech normal.        Behavior: Behavior normal.        Thought Content: Thought content normal. Thought content is not paranoid or delusional. Thought content does not include homicidal or suicidal ideation. Thought content does not include homicidal or suicidal plan.        Cognition and Memory: Cognition and memory  normal.        Judgment: Judgment normal.     Comments: Insight intact     Lab Review:     Component Value Date/Time   NA 142 05/07/2019 1319   K 4.5 05/07/2019 1319   CL 106 05/07/2019 1319   CO2 26 05/07/2019 1319   GLUCOSE 106 (H) 05/07/2019 1319   GLUCOSE 98 10/31/2018  1247   BUN 21 05/07/2019 1319   CREATININE 0.88 05/07/2019 1319   CREATININE 0.92 10/31/2018 1247   CREATININE 1.07 10/14/2015 0850   CALCIUM 9.6 05/07/2019 1319   PROT 7.4 05/31/2019 0820   ALBUMIN 5.0 (H) 05/31/2019 0820   AST 18 05/31/2019 0820   AST 19 10/31/2018 1247   ALT 24 07/16/2019 0943   ALT 25 10/31/2018 1247   ALKPHOS 111 05/31/2019 0820   BILITOT 0.3 05/31/2019 0820   BILITOT 0.3 10/31/2018 1247   GFRNONAA 89 05/07/2019 1319   GFRNONAA >60 10/31/2018 1247   GFRAA 103 05/07/2019 1319   GFRAA >60 10/31/2018 1247       Component Value Date/Time   WBC 4.8 05/07/2019 1319   WBC 5.3 10/31/2018 1247   WBC 7.0 03/29/2017 0554   RBC 4.07 (L) 05/07/2019 1319   RBC 4.02 (L) 10/31/2018 1247   HGB 12.0 (L) 05/07/2019 1319   HCT 35.7 (L) 05/07/2019 1319   PLT 245 05/07/2019 1319   MCV 88 05/07/2019 1319   MCH 29.5 05/07/2019 1319   MCH 29.1 10/31/2018 1247   MCHC 33.6 05/07/2019 1319   MCHC 32.3 10/31/2018 1247   RDW 13.9 05/07/2019 1319   LYMPHSABS 1.7 05/07/2019 1319   MONOABS 0.4 10/31/2018 1247   EOSABS 0.2 05/07/2019 1319   BASOSABS 0.0 05/07/2019 1319    No results found for: POCLITH, LITHIUM   No results found for: PHENYTOIN, PHENOBARB, VALPROATE, CBMZ   .res Assessment: Plan:    Plan:  PDMP reviewed  1. Luvox 200mg  daily 2. Wellbutrin XL 150mg  daily   Consider therapy  RTC 2 months  Patient advised to contact office with any questions, adverse effects, or acute worsening in signs and symptoms.   Blake Carter was seen today for anxiety, depression and other.  Diagnoses and all orders for this visit:  Major depressive disorder, recurrent episode, moderate (HCC) -     buPROPion (WELLBUTRIN XL) 150 MG 24 hr tablet; Take 1 tablet (150 mg total) by mouth daily.  Generalized anxiety disorder -     buPROPion (WELLBUTRIN XL) 150 MG 24 hr tablet; Take 1 tablet (150 mg total) by mouth daily.  Obsessive-compulsive disorder, unspecified type -      buPROPion (WELLBUTRIN XL) 150 MG 24 hr tablet; Take 1 tablet (150 mg total) by mouth daily.    Please see After Visit Summary for patient specific instructions.  Future Appointments  Date Time Provider [place]  08/20/2019  7:05 AM [facility] DEVICE REMOTES [facility]  08/20/2019  9:45 AM Evans Lance, MD [facility]  09/04/2019 11:00 AM [facility] INJ 1 [facility] [place]  09/04/2019  2:00 PM [facility] 1 [facility] [place]  11/19/2019  7:05 AM [facility] DEVICE REMOTES [facility]  02/18/2020  7:05 AM [facility] DEVICE REMOTES [facility]  05/19/2020  7:05 AM [facility] DEVICE REMOTES [facility]    No orders of the defined types were placed in this encounter.     -------------------------------

## 2019-07-27 ENCOUNTER — Ambulatory Visit: Payer: Medicare Other

## 2019-08-08 DIAGNOSIS — H401121 Primary open-angle glaucoma, left eye, mild stage: Secondary | ICD-10-CM | POA: Diagnosis not present

## 2019-08-10 ENCOUNTER — Other Ambulatory Visit: Payer: Self-pay

## 2019-08-10 MED ORDER — LISINOPRIL 5 MG PO TABS: 5.0000 mg | ORAL_TABLET | Freq: Every day | ORAL | 1 refills | Status: DC

## 2019-08-13 NOTE — Progress Notes (Signed)
Patient ID: Blake Carter                 DOB: Jan 29, 1951                    MRN: [id number]     HPI: Blake Carter is a 69 y.o. male patient referred to lipid clinic by Dr. Radford Carter. PMH is significant for hx of CAD (DES to LCx 05/2014 with residual LAD disease with repeat cath 02/2017 with patent stents and otherwise mild nonobstructive disease), HTN, dyslipidemia, sinus bradycardia, dilated aortic root (23mm by echo 10/2018), chronic diastolic CHF, allergic rhinits, GERD, hypothyroidism, hx of prostate cancer, hx of hydronephrosis of right kidney (s/p ureteral stenting), depression, and insomnia. Patient's LDL had increased from 68 mg/dL in 01/2019 to 89 mg/dL in 05/2019. Dr. Radford Carter increased patient's atorvastatin from 40 mg daily to 80 mg daily. Patient contacted clinic on 08/09/2019 stating he has been experiencing leg weakness ever since atorvastatin dosage increase. He is concerned since he has read this is a side effect of statin medication. He mentioned he did not experience leg weakness with atorvastatin 40 mg dose and he has increased his exercise significantly (50 minutes of walking 5-6x per week).  Patient presents today for for initial appt with lipid clinic. Patient states in the past TG have been > 1000. He states his grandmother had TG "that high" as well. He is adamant about not wanting to take an injectable medication. He has concerns regarding fish oil use in correlation to prostate cancer.  Current Medications: atorvastatin 80 mg daily, gemfibrozil 600 mg twice daily Intolerances: none Risk Factors: ASCVD (CAD s/p DES and repeat cath in 02/2017 with patent stents), HTN, HLD, familial cardiac history LDL goal: <70 mg/dL  Diet: 3 or 4 meals per day (small portions) -Breakfast: eggs with cereal/skim milk -Lunch: sandwich -Dinner: chicken, red meat, pork, lamb, whole wheat pasta/brown rice/beans, states he could eat more vegetables -Snacks: fruit -Desert: occasional (1x per  month) -Fried food: once every 6 weeks -Fast food: very rarely (in the morning - egg McMuffin from [employer]) - once a month -Drinks: water, sugar free Powerade, "not too much of soda" - when he drinks he drinks sugar free  Exercise: 6 out 7 days, walks dog ~2 miles for 50 minutes each day   Family History: father (cancer (died from small lung cancer per pt report), heart disease ("multiple bypasses", "aorta replaced"), HLD); brother (CAD); "everyone on my dad's side had some kind of coronary disease / MI / heart failure"; grandmother (elevated TG)  Social History: never used tobacco, occasional alcohol use (1x or 2x per month)  Labs: 07/16/2019 TC 157 TG 124 VLDL 22 HDL 46 LDL 89; atorvastatin 80 mg daily, gemfibrozil 600 mg daily 05/31/2019 TC 172 TG 164 VLDL 28 HDL 47 LDL 97; atorvastatin 40 mg daily, gemfibrozil 600 mg daily 01/30/2019 TC 137 TG175 VLDL 30 HDL 39 LDL 73; atorvastatin 40 mg daily, gemfibrozil 600 mg daily  Past Medical History:  Diagnosis Date  . Allergic rhinitis   . Arthritis    "a little bit in some of my joints" (06/20/2014)  . Chronic diastolic CHF (congestive heart failure) ([place])   . Coronary artery disease    a. 05/2014 Cath/PCI: LM nl, LAD 40p, 60-70d (FFR 0.80->3.0x24 Synergy DES), D2 40, LCX 90d (2.25x12 Synergy DES), RCA 20-93m, EF 60-65%. b. Cath 02/2017 without acute change, elev LVEDP suggestive of dCHF.  Marland Kitchen Depression   . Dilated aortic root ([place])  aortic root 74mm and ascending aorta 53mm by echo 10/2016  . ED (erectile dysfunction)   . Environmental allergies    dry cough  . GERD (gastroesophageal reflux disease)   . History of hiatal hernia   . Hydronephrosis of right kidney    a. s/p ureteral stenting in the past.  . Hyperlipidemia    under control  . Hypertension   . Hypothyroidism   . OCD (obsessive compulsive disorder)   . Panic disorder   . PONV (postoperative nausea and vomiting)    after thyroid surgery no problems in 10 years   .  Prostate cancer [facility])    prostate cancer- robotic prostatectomy - followed by radiation because of positive lymph node--and pt on lupron injections  . Sinus bradycardia    a. chronic, asymptomatic.  24 hour Holter 10/2016 showed Sinus bradycardia, sinus arrhythmia and normal sinus rhythm with average heart rate 50bpm and heart rate ranged from 32 to 100bpm.  . SUI (stress urinary incontinence), male    S/P ROBOTIC PROSTATECTOMY AND RADIATION TX    Current Outpatient Medications on File Prior to Visit  Medication Sig Dispense Refill  . acetaminophen (TYLENOL) 500 MG tablet Take 1,000 mg by mouth every 6 (six) hours as needed (pain).    . Ascorbic Acid (VITAMIN C) 1000 MG tablet     . aspirin EC 81 MG tablet Take 81 mg by mouth daily.    Marland Kitchen atorvastatin (LIPITOR) 80 MG tablet Take 1 tablet (80 mg total) by mouth daily. 90 tablet 3  . buPROPion (WELLBUTRIN XL) 150 MG 24 hr tablet Take 1 tablet (150 mg total) by mouth daily. 90 tablet 1  . Cholecalciferol (VITAMIN D3) 2000 units TABS Take 2,000 Units by mouth daily.    . fexofenadine (ALLEGRA) 180 MG tablet Take 180 mg by mouth daily.    . fluvoxaMINE (LUVOX) 50 MG tablet Take 200 mg by mouth every morning.     . furosemide (LASIX) 20 MG tablet TAKE 1 TABLET BY MOUTH  DAILY AS NEEDED FOR FLUID  OR EDEMA (Patient taking differently: Take 20 mg by mouth daily as needed for edema. ) 90 tablet 3  . gemfibrozil (LOPID) 600 MG tablet Take 600 mg by mouth 2 (two) times daily.     Marland Kitchen ibuprofen (ADVIL) 200 MG tablet Take 400-800 mg by mouth every 8 (eight) hours as needed for moderate pain.     Marland Kitchen latanoprost (XALATAN) 0.005 % ophthalmic solution Place 1 drop into both eyes at bedtime.     Marland Kitchen levothyroxine (SYNTHROID) 200 MCG tablet     . levothyroxine (SYNTHROID, LEVOTHROID) 175 MCG tablet Take 175 mcg by mouth daily before breakfast.    . lisinopril (ZESTRIL) 5 MG tablet Take 1 tablet (5 mg total) by mouth daily. 90 tablet 1  . Multiple Vitamin  (MULTIVITAMIN) capsule Take 1 capsule by mouth daily.    Marland Kitchen MYRBETRIQ 50 MG TB24 tablet Take 50 mg by mouth daily.    . nitroGLYCERIN (NITROSTAT) 0.4 MG SL tablet PLACE 1 TABLET (0.4 MG TOTAL) UNDER THE TONGUE EVERY 5 (FIVE) MINUTES AS NEEDED FOR CHEST PAIN. 25 tablet 3  . pantoprazole (PROTONIX) 40 MG tablet TAKE 2 TABLETS BY MOUTH  DAILY 180 tablet 3  . zinc gluconate 50 MG tablet Take 50 mg by mouth daily.     No current facility-administered medications on file prior to visit.    Allergies  Allergen Reactions  . Niacin And Related Other (See Comments)  Flushing   . Tagamet [Cimetidine] Other (See Comments)    unknown    Assessment/Plan:  1. Hyperlipidemia - goal LDL < 70 mg/dL; therefore, patient is not at goal. Myalgias are likely due to concurrent use of gemfibrozil and atorvastatin. Will switch gemfibrozil to fenofibrate considering it is less of an interaction with statins and less likely to cause myalgias and since patient has concerns regarding Vascepa as he has prostate cancer (has heard there may be an association). Will further look into possible correlation between Vascepa and prostate cancer to discuss with patient at follow up appt. Will switch from atorvastatin 80 mg daily to rosuvastatin 40 mg (rosuvastatin slightly stronger than atorvastatin). Discussed importance of healthy diet and exercise. Will check lipids on 10/22/2019 and scheduled follow up appt for 10/24/2019. Patient verbalized understanding. If LDL remains elevated will consider initiation of Zetia or Nexlizet in the future (patient is not open to using an injectable).  Future considerations -Rosuvastatin 40 mg daily - tier 1 agent (same price as atorvastatin) - $10 for 30 day supply; $24 for 90 day supply -Zetia 10 mg daily, Nexletol 180 mg daily, Nexlizet 180-10 mg daily, Praluent 150 mg subQ every 14 days, Vascepa - tier 3 (PA, quantity limit 30 per 30 days);  $64 for 30 day supply; $128 for 90 day  supply  Thank you for involving pharmacy to assist in providing this patient's care.   Drexel Iha, PharmD PGY2 Ambulatory Care Pharmacy Resident

## 2019-08-14 ENCOUNTER — Other Ambulatory Visit: Payer: Self-pay

## 2019-08-14 ENCOUNTER — Ambulatory Visit (INDEPENDENT_AMBULATORY_CARE_PROVIDER_SITE_OTHER): Payer: Medicare PPO | Admitting: Pharmacist

## 2019-08-14 DIAGNOSIS — E7800 Pure hypercholesterolemia, unspecified: Secondary | ICD-10-CM

## 2019-08-14 MED ORDER — PANTOPRAZOLE SODIUM 40 MG PO TBEC: 80.0000 mg | DELAYED_RELEASE_TABLET | Freq: Every day | ORAL | 3 refills | Status: DC

## 2019-08-14 MED ORDER — ROSUVASTATIN CALCIUM 40 MG PO TABS: 40.0000 mg | ORAL_TABLET | Freq: Every day | ORAL | 3 refills | Status: DC

## 2019-08-14 MED ORDER — FENOFIBRATE 145 MG PO TABS: 145.0000 mg | ORAL_TABLET | Freq: Every day | ORAL | 3 refills | Status: DC

## 2019-08-14 NOTE — Patient Instructions (Addendum)
It was a pleasure seeing you in clinic today Mr. Parker!  Today the plan is... 1. STOP atorvastatin and start rosuvastatin 40 mg daily 2. STOP gemfibrozil and start fenofibrate 145 mg daily  3. Follow up in 2 months  Please call the PharmD clinic at [phone] if you have any questions that you would like to speak with a pharmacist about [facility], [place], [place]).

## 2019-08-18 ENCOUNTER — Other Ambulatory Visit: Payer: Self-pay | Admitting: Cardiology

## 2019-08-20 ENCOUNTER — Other Ambulatory Visit: Payer: Self-pay

## 2019-08-20 ENCOUNTER — Ambulatory Visit (INDEPENDENT_AMBULATORY_CARE_PROVIDER_SITE_OTHER): Payer: Medicare PPO | Admitting: *Deleted

## 2019-08-20 ENCOUNTER — Ambulatory Visit: Payer: Medicare PPO | Admitting: Internal Medicine

## 2019-08-20 ENCOUNTER — Encounter: Payer: Self-pay | Admitting: Internal Medicine

## 2019-08-20 VITALS — BP 128/88 | HR 66 | Resp 15 | Ht 71.0 in | Wt 260.8 lb

## 2019-08-20 DIAGNOSIS — I495 Sick sinus syndrome: Secondary | ICD-10-CM | POA: Diagnosis not present

## 2019-08-20 DIAGNOSIS — Z950 Presence of cardiac pacemaker: Secondary | ICD-10-CM

## 2019-08-20 DIAGNOSIS — I10 Essential (primary) hypertension: Secondary | ICD-10-CM | POA: Diagnosis not present

## 2019-08-20 DIAGNOSIS — I2583 Coronary atherosclerosis due to lipid rich plaque: Secondary | ICD-10-CM | POA: Diagnosis not present

## 2019-08-20 DIAGNOSIS — I2510 Atherosclerotic heart disease of native coronary artery without angina pectoris: Secondary | ICD-10-CM

## 2019-08-20 LAB — CUP PACEART INCLINIC DEVICE CHECK
Brady Statistic RA Percent Paced: 98 %
Brady Statistic RV Percent Paced: 0 %
Date Time Interrogation Session: 20210322095100
Implantable Lead Implant Date: 20201221
Implantable Lead Implant Date: 20201221
Implantable Lead Location: 753859
Implantable Lead Location: 753860
Implantable Lead Model: 377169
Implantable Lead Model: 377171
Implantable Lead Serial Number: 7000011949
Implantable Lead Serial Number: 81083606
Implantable Pulse Generator Implant Date: 20201221
Lead Channel Impedance Value: 507 Ohm
Lead Channel Impedance Value: 565 Ohm
Lead Channel Pacing Threshold Amplitude: 0.9 V
Lead Channel Pacing Threshold Amplitude: 1.2 V
Lead Channel Pacing Threshold Pulse Width: 0.4 ms
Lead Channel Pacing Threshold Pulse Width: 0.4 ms
Lead Channel Sensing Intrinsic Amplitude: 11.2 mV
Lead Channel Sensing Intrinsic Amplitude: 3.3 mV
Lead Channel Setting Pacing Amplitude: 2.4 V
Lead Channel Setting Pacing Amplitude: 2.4 V
Lead Channel Setting Pacing Pulse Width: 0.4 ms
Pulse Gen Model: 407145
Pulse Gen Serial Number: 69703841

## 2019-08-20 NOTE — Patient Instructions (Signed)
Medication Instructions:  Your physician recommends that you continue on your current medications as directed. Please refer to the Current Medication list given to you today.  Labwork: None ordered.  Testing/Procedures: None ordered.  Follow-Up: Your physician wants you to follow-up in: one year with Dr. Lovena Le.   You will receive a reminder letter in the mail two months in advance. If you don't receive a letter, please call our office to schedule the follow-up appointment.  Remote monitoring is used to monitor your Pacemaker from home. This monitoring reduces the number of office visits required to check your device to one time per year. It allows [place] to keep an eye on the functioning of your device to ensure it is working properly. You are scheduled for a device check from home on 11/19/2019. You may send your transmission at any time that day. If you have a wireless device, the transmission will be sent automatically. After your physician reviews your transmission, you will receive a postcard with your next transmission date.  Any Other Special Instructions Will Be Listed Below (If Applicable).  If you need a refill on your cardiac medications before your next appointment, please call your pharmacy.

## 2019-08-20 NOTE — Progress Notes (Signed)
HPI Blake Carter returns today for followup of sinus node dysfunction, s/p PPM insertion. He has HTN and has done well since his PPM was placed over 3 months ago. The patient has done well since his PPM was placed and denies syncope or chest pain. He has rare palpitations. Allergies  Allergen Reactions  . Tagamet [Cimetidine] Other (See Comments)    Unknown - over 30 years ago (2021) - unsure what occurred it may have been hives or flushing  . Atorvastatin     Myalgias on 80mg  dose  . Niacin And Related Other (See Comments)    Flushing      Current Outpatient Medications  Medication Sig Dispense Refill  . acetaminophen (TYLENOL) 500 MG tablet Take 1,000 mg by mouth every 6 (six) hours as needed (pain).    . Ascorbic Acid (VITAMIN C) 500 MG CAPS Take 500 mg by mouth daily.    Marland Kitchen aspirin EC 81 MG tablet Take 81 mg by mouth daily.    Marland Kitchen buPROPion (WELLBUTRIN XL) 150 MG 24 hr tablet Take 1 tablet (150 mg total) by mouth daily. 90 tablet 1  . Cholecalciferol (VITAMIN D3) 2000 units TABS Take 5,000 Units by mouth daily.     . fenofibrate (TRICOR) 145 MG tablet Take 1 tablet (145 mg total) by mouth daily. 30 tablet 3  . fexofenadine (ALLEGRA) 180 MG tablet Take 180 mg by mouth daily.    . fluvoxaMINE (LUVOX) 50 MG tablet Take 200 mg by mouth every morning.     . furosemide (LASIX) 20 MG tablet TAKE 1 TABLET BY MOUTH  DAILY AS NEEDED FOR FLUID  OR EDEMA 90 tablet 3  . ibuprofen (ADVIL) 200 MG tablet Take 400-800 mg by mouth every 8 (eight) hours as needed for moderate pain.     Marland Kitchen latanoprost (XALATAN) 0.005 % ophthalmic solution Place 1 drop into both eyes at bedtime.     Marland Kitchen leuprolide, 6 Month, (ELIGARD) 45 MG injection Inject 45 mg into the skin every 6 (six) months.    . levothyroxine (SYNTHROID) 200 MCG tablet     . lisinopril (ZESTRIL) 5 MG tablet Take 1 tablet (5 mg total) by mouth daily. 90 tablet 1  . Multiple Vitamin (MULTIVITAMIN) capsule Take 1 capsule by mouth daily.    Marland Kitchen  MYRBETRIQ 50 MG TB24 tablet Take 50 mg by mouth daily.    . pantoprazole (PROTONIX) 40 MG tablet Take 2 tablets (80 mg total) by mouth daily. 180 tablet 3  . rosuvastatin (CRESTOR) 40 MG tablet Take 1 tablet (40 mg total) by mouth daily. 90 tablet 3  . zinc gluconate 50 MG tablet Take 50 mg by mouth daily.    . nitroGLYCERIN (NITROSTAT) 0.4 MG SL tablet PLACE 1 TABLET (0.4 MG TOTAL) UNDER THE TONGUE EVERY 5 (FIVE) MINUTES AS NEEDED FOR CHEST PAIN. 25 tablet 4   No current facility-administered medications for this visit.     Past Medical History:  Diagnosis Date  . Allergic rhinitis   . Arthritis    "a little bit in some of my joints" (06/20/2014)  . Chronic diastolic CHF (congestive heart failure) ([place])   . Coronary artery disease    a. 05/2014 Cath/PCI: LM nl, LAD 40p, 60-70d (FFR 0.80->3.0x24 Synergy DES), D2 40, LCX 90d (2.25x12 Synergy DES), RCA 20-25m, EF 60-65%. b. Cath 02/2017 without acute change, elev LVEDP suggestive of dCHF.  Marland Kitchen Depression   . Dilated aortic root ([facility])    aortic root  59mm and ascending aorta 24mm by echo 10/2016  . ED (erectile dysfunction)   . Environmental allergies    dry cough  . GERD (gastroesophageal reflux disease)   . History of hiatal hernia   . Hydronephrosis of right kidney    a. s/p ureteral stenting in the past.  . Hyperlipidemia    under control  . Hypertension   . Hypothyroidism   . OCD (obsessive compulsive disorder)   . Panic disorder   . PONV (postoperative nausea and vomiting)    after thyroid surgery no problems in 10 years   . Prostate cancer [facility])    prostate cancer- robotic prostatectomy - followed by radiation because of positive lymph node--and pt on lupron injections  . Sinus bradycardia    a. chronic, asymptomatic.  24 hour Holter 10/2016 showed Sinus bradycardia, sinus arrhythmia and normal sinus rhythm with average heart rate 50bpm and heart rate ranged from 32 to 100bpm.  . SUI (stress urinary incontinence), male    S/P  ROBOTIC PROSTATECTOMY AND RADIATION TX    ROS:   All systems reviewed and negative except as noted in the HPI.   Past Surgical History:  Procedure Laterality Date  . COLONOSCOPY  07/2013   Dr. Oletta Lamas  . CORONARY ANGIOPLASTY WITH STENT PLACEMENT  06/20/2014   "2"  . CYSTOSCOPY  11/30/2011   Procedure: CYSTOSCOPY;  Surgeon: Reece Packer, MD;  Location: [facility];  Service: Urology;  Laterality: N/A;  Inplantation of Artificial Sphincter and Cystoscopy  . CYSTOSCOPY WITH RETROGRADE PYELOGRAM, URETEROSCOPY AND STENT PLACEMENT Right 02/11/2014   Procedure: CYSTOSCOPY WITH RETROGRADE PYELOGRAM, URETEROSCOPY ,URETERAL BIOPSY AND STENT PLACEMENT;  Surgeon: Raynelle Bring, MD;  Location: [facility];  Service: Urology;  Laterality: Right;  . CYSTOSCOPY WITH RETROGRADE PYELOGRAM, URETEROSCOPY AND STENT PLACEMENT Right 04/15/2014   Procedure: CYSTOSCOPY WITH RETROGRADE PYELOGRAM, URETEROSCOPY, BIOPSY AND STENT PLACEMENT;  Surgeon: Raynelle Bring, MD;  Location: [facility];  Service: Urology;  Laterality: Right;  . LEFT HEART CATH AND CORONARY ANGIOGRAPHY N/A 03/29/2017   Procedure: LEFT HEART CATH AND CORONARY ANGIOGRAPHY;  Surgeon: Leonie Man, MD;  Location: [place] [facility];  Service: Cardiovascular;  Laterality: N/A;  . LEFT HEART CATHETERIZATION WITH CORONARY ANGIOGRAM N/A 06/20/2014   Procedure: LEFT HEART CATHETERIZATION WITH CORONARY ANGIOGRAM;  Surgeon: Leonie Man, MD;  Location: [facility] CATH LAB;  Service: Cardiovascular;  Laterality: N/A;  . PACEMAKER IMPLANT N/A 05/21/2019   Procedure: PACEMAKER IMPLANT;  Surgeon: Evans Lance, MD;  Location: [place] [facility];  Service: Cardiovascular;  Laterality: N/A;  . PERCUTANEOUS CORONARY STENT INTERVENTION (PCI-S)  06/20/2014   Procedure: PERCUTANEOUS CORONARY STENT INTERVENTION (PCI-S);  Surgeon: Leonie Man, MD;  Location: [facility] CATH LAB;  Service: Cardiovascular;;  LAD and Circumflex  . REFRACTIVE SURGERY Bilateral 2004  . ROBOT ASSISTED  LAPAROSCOPIC RADICAL PROSTATECTOMY  05/2010  . THYROIDECTOMY, PARTIAL  10/1974  . TONSILLECTOMY  1956   . TOTAL THYROIDECTOMY  10/2012   "nuked it"  . UPPER GI ENDOSCOPY     food impaction 2009 done by Dr Oletta Lamas  . URINARY SPHINCTER IMPLANT  11/30/2011   Procedure: ARTIFICIAL URINARY SPHINCTER;  Surgeon: Reece Packer, MD;  Location: [facility];  Service: Urology;  Laterality: N/A;     Family History  Problem Relation Age of Onset  . Cancer Father   . Hyperlipidemia Father   . Heart disease Father        Father had CABG/aorta repair by the time he was  in his 22s  . CAD Brother        Brother who is 70 years younger has had some sort of stenting     Social History   Socioeconomic History  . Marital status: Married    Spouse name: Not on file  . Number of children: Not on file  . Years of education: Not on file  . Highest education level: Not on file  Occupational History  . Not on file  Tobacco Use  . Smoking status: Never Smoker  . Smokeless tobacco: Never Used  Substance and Sexual Activity  . Alcohol use: Yes    Alcohol/week: 0.0 standard drinks    Comment: 06/20/2014 "1-2 drinks a couple times/month"  . Drug use: No  . Sexual activity: Yes  Other Topics Concern  . Not on file  Social History Narrative  . Not on file   Social Determinants of Health   Financial Resource Strain:   . Difficulty of Paying Living Expenses:   Food Insecurity:   . Worried About Charity fundraiser in the Last Year:   . Arboriculturist in the Last Year:   Transportation Needs:   . Film/video editor (Medical):   Marland Kitchen Lack of Transportation (Non-Medical):   Physical Activity:   . Days of Exercise per Week:   . Minutes of Exercise per Session:   Stress:   . Feeling of Stress :   Social Connections:   . Frequency of Communication with Friends and Family:   . Frequency of Social Gatherings with Friends and Family:   . Attends Religious Services:   . Active Member of Clubs or  Organizations:   . Attends Archivist Meetings:   Marland Kitchen Marital Status:   Intimate Partner Violence:   . Fear of Current or Ex-Partner:   . Emotionally Abused:   Marland Kitchen Physically Abused:   . Sexually Abused:      BP 128/88   Pulse 66   Resp 15   Ht 5\' 11"  (1.803 m)   Wt 260 lb 12.8 oz (118.3 kg)   SpO2 97%   BMI 36.37 kg/m   Physical Exam:  Well appearing NAD HEENT: Unremarkable Neck:  No JVD, no thyromegally Lymphatics:  No adenopathy Back:  No CVA tenderness Lungs:  Clear HEART:  Regular rate rhythm, no murmurs, no rubs, no clicks Abd:  soft, positive bowel sounds, no organomegally, no rebound, no guarding Ext:  2 plus pulses, no edema, no cyanosis, no clubbing Skin:  No rashes no nodules Neuro:  CN II through XII intact, motor grossly intact  EKG - nsr with atrial pacing  DEVICE  Normal device function.  See PaceArt for details.   Assess/Plan: 1. Sinus node dysfunction - he is asymptomatic, s/p PPM insertion. 2. HTN - his bp is improved on medical therapy. 3. Obesity - he has had difficulty with weight loss. Now that he is paced, I have encouraged him to maintain a low fat diet and exercise.  4. CAD - he denies anginal symptoms. We will follow.  Mikle Bosworth.D.

## 2019-08-21 ENCOUNTER — Other Ambulatory Visit: Payer: Self-pay | Admitting: Internal Medicine

## 2019-08-21 LAB — CUP PACEART REMOTE DEVICE CHECK
Battery Remaining Percentage: 100 %
Brady Statistic RA Percent Paced: 98 %
Brady Statistic RV Percent Paced: 0 %
Date Time Interrogation Session: 20210322100656
Implantable Lead Implant Date: 20201221
Implantable Lead Implant Date: 20201221
Implantable Lead Location: 753859
Implantable Lead Location: 753860
Implantable Lead Model: 377169
Implantable Lead Model: 377171
Implantable Lead Serial Number: 7000011949
Implantable Lead Serial Number: 81083606
Implantable Pulse Generator Implant Date: 20201221
Lead Channel Impedance Value: 488 Ohm
Lead Channel Impedance Value: 546 Ohm
Lead Channel Pacing Threshold Amplitude: 0.9 V
Lead Channel Pacing Threshold Amplitude: 1.4 V
Lead Channel Pacing Threshold Pulse Width: 0.4 ms
Lead Channel Pacing Threshold Pulse Width: 0.4 ms
Lead Channel Sensing Intrinsic Amplitude: 2.9 mV
Lead Channel Sensing Intrinsic Amplitude: 9.6 mV
Lead Channel Setting Pacing Amplitude: 2.4 V
Lead Channel Setting Pacing Amplitude: 2.4 V
Lead Channel Setting Pacing Pulse Width: 0.4 ms
Pulse Gen Model: 407145
Pulse Gen Serial Number: 69703841

## 2019-08-21 MED ORDER — LISINOPRIL 5 MG PO TABS: 5.0000 mg | ORAL_TABLET | Freq: Every day | ORAL | 3 refills | Status: DC

## 2019-08-21 MED ORDER — PANTOPRAZOLE SODIUM 40 MG PO TBEC: 80.0000 mg | DELAYED_RELEASE_TABLET | Freq: Every day | ORAL | 3 refills | Status: DC

## 2019-08-27 DIAGNOSIS — C61 Malignant neoplasm of prostate: Secondary | ICD-10-CM | POA: Diagnosis not present

## 2019-09-03 DIAGNOSIS — E890 Postprocedural hypothyroidism: Secondary | ICD-10-CM | POA: Diagnosis not present

## 2019-09-04 ENCOUNTER — Ambulatory Visit (HOSPITAL_COMMUNITY)
Admission: RE | Admit: 2019-09-04 | Discharge: 2019-09-04 | Disposition: A | Discharge status: Home / Self Care | Payer: Medicare PPO | Source: Ambulatory Visit | Attending: Urology | Admitting: Urology

## 2019-09-04 ENCOUNTER — Other Ambulatory Visit: Payer: Self-pay

## 2019-09-04 DIAGNOSIS — C7951 Secondary malignant neoplasm of bone: Secondary | ICD-10-CM | POA: Diagnosis not present

## 2019-09-04 DIAGNOSIS — K5730 Diverticulosis of large intestine without perforation or abscess without bleeding: Secondary | ICD-10-CM | POA: Diagnosis not present

## 2019-09-04 DIAGNOSIS — K8020 Calculus of gallbladder without cholecystitis without obstruction: Secondary | ICD-10-CM | POA: Diagnosis not present

## 2019-09-04 DIAGNOSIS — Z8546 Personal history of malignant neoplasm of prostate: Secondary | ICD-10-CM | POA: Diagnosis not present

## 2019-09-04 DIAGNOSIS — C61 Malignant neoplasm of prostate: Secondary | ICD-10-CM | POA: Insufficient documentation

## 2019-09-04 MED ORDER — TECHNETIUM TC 99M MEDRONATE IV KIT
22.0000 | PACK | Freq: Once | INTRAVENOUS | Status: AC
  Administered 2019-09-04: 22 via INTRAVENOUS

## 2019-09-07 DIAGNOSIS — C61 Malignant neoplasm of prostate: Secondary | ICD-10-CM | POA: Diagnosis not present

## 2019-09-07 DIAGNOSIS — N3946 Mixed incontinence: Secondary | ICD-10-CM | POA: Diagnosis not present

## 2019-09-07 DIAGNOSIS — N5201 Erectile dysfunction due to arterial insufficiency: Secondary | ICD-10-CM | POA: Diagnosis not present

## 2019-09-17 NOTE — Progress Notes (Signed)
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  Sites of Visceral and Bony Metastatic Disease: s/p prostatectomy with multiple positive margins and one positive lymph node of 6 tested.  Location(s) of Symptomatic Metastases: L3/L4    Pain on a scale of 0-10 is: 0/10    If Spine Met(s), symptoms, if any, include:  Bowel/Bladder retention or incontinence (please describe): Satisfied with urinary sphincter. Taking Myrbetriq.   Numbness or weakness in extremities (please describe): no  Current Decadron regimen, if applicable: no  Ambulatory status? Walker? Wheelchair?: Ambulatory  SAFETY ISSUES:  Prior radiation? Yes under the care of Dr. Valere Dross (may-June 2012)  Pacemaker/ICD? yes, for symptomatic bradycardia  Possible current pregnancy? no, male patient  Is the patient on methotrexate? no Spoke with patient he is doing well walked his dog 3 miles today and 4 miles yesterday. Is ready to start radiation. Current Complaints / other details:  69 year old male. Had an artificial urinary sphincter placed by Dr. Matilde Sprang in 2013. Mother with a hx of brain cancer. Father with a hx of lung ca. Patient is married.

## 2019-09-18 ENCOUNTER — Ambulatory Visit
Admission: RE | Admit: 2019-09-18 | Discharge: 2019-09-18 | Disposition: A | Discharge status: Home / Self Care | Payer: Medicare PPO | Source: Ambulatory Visit | Attending: Radiation Oncology | Admitting: Radiation Oncology

## 2019-09-18 ENCOUNTER — Encounter: Payer: Self-pay | Admitting: Radiation Oncology

## 2019-09-18 DIAGNOSIS — C7951 Secondary malignant neoplasm of bone: Secondary | ICD-10-CM | POA: Diagnosis not present

## 2019-09-18 DIAGNOSIS — C61 Malignant neoplasm of prostate: Secondary | ICD-10-CM

## 2019-09-18 DIAGNOSIS — Z79899 Other long term (current) drug therapy: Secondary | ICD-10-CM | POA: Diagnosis not present

## 2019-09-18 DIAGNOSIS — R9721 Rising PSA following treatment for malignant neoplasm of prostate: Secondary | ICD-10-CM | POA: Diagnosis not present

## 2019-09-18 DIAGNOSIS — Z923 Personal history of irradiation: Secondary | ICD-10-CM | POA: Diagnosis not present

## 2019-09-18 DIAGNOSIS — Z192 Hormone resistant malignancy status: Secondary | ICD-10-CM | POA: Diagnosis not present

## 2019-09-18 NOTE — Progress Notes (Signed)
Radiation Oncology         [phone] ________________________________  Initial Outpatient Consultation - Conducted via Telephone due to current COVID-19 concerns for limiting patient exposure  Name: Blake Carter MRN: [id number]  Date: 09/18/2019  DOB: 17-Jan-1951  [id number], Lennette Bihari, MD  Raynelle Bring, MD   REFERRING PHYSICIAN: Raynelle Bring, MD  DIAGNOSIS: 69 y.o. 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    ICD-10-CM   1. Secondary malignant neoplasm of bone and bone marrow (HCC)  C79.51    C79.52     HISTORY OF PRESENT ILLNESS: Blake Carter is a 69 y.o. male with a diagnosis of prostate cancer. He was initially diagnosed with low-volume Gleason 3+4 prostate cancer with pre-treatment PSA of 3.95 in 2011. At that time, he elected to proceed with prostatectomy and underwent RALP with BPLND on 06/08/2010 under the care of Dr. Alinda Money. Final surgical pathology revealed Gleason 4+3, pT3a,N1, adenocarcinoma of the prostate, with tertiary pattern 5, with extraprostatic extension, multiple positive margins, angiolymphatic invasion and extensive perineural invasion. A soft tissue mass at the apical margin was biopsied at the time of the procedure and showed involvement by high grade carcinoma with extensive PNI. Out of 6 biopsied lymph node, one was positive for metastatic carcinoma with extracapsular extension (1/6).  His postoperative PSA was detectable so he proceeded with adjuvant radiotherapy under the care of Dr. Valere Dross from May to June of 2012, concurrent with 2 years of ADT.  His PSA became undetectable and remained undetectable until 07/2015 and his PSA increased to 1.43 in June 2017.  His PSA continued to gradually rise but had a significant increase from 5 in 12/2016 to 7.96 in 03/2017.  At that time, he resumed ADT and has continued on this therapy.  Disease restaging studies performed in 03/2017 remained  negative for metastatic disease.   Unfortunately, his PSA has continued to slowly rise since that time, despite ADT with castrate levels of testosterone.  His PSA had increased to 1.57 in January 2021 and most recently, up to 3.06 on 08/27/2019. This prompted restaging scans, performed on 09/04/2019. CT A/P revealed evidence of bony metastatic disease with a 3 cm lesion involving the right lateral aspect of L4 and bone scan confirmed involvement in the area of L3-L4.  The patient reviewed the PSA and imaging results with his urologist and he has kindly been referred today for discussion of potential radiation treatment options for management of his oligometastatic disease.  PREVIOUS RADIATION THERAPY: Yes  09/2010 - 10/2010: Prostate Fossa and pelvic nodes (Dr. Valere Dross)  PAST MEDICAL HISTORY:  Past Medical History:  Diagnosis Date  . Allergic rhinitis   . Arthritis    "a little bit in some of my joints" (06/20/2014)  . Chronic diastolic CHF (congestive heart failure) ([place])   . Coronary artery disease    a. 05/2014 Cath/PCI: LM nl, LAD 40p, 60-70d (FFR 0.80->3.0x24 Synergy DES), D2 40, LCX 90d (2.25x12 Synergy DES), RCA 20-20m, EF 60-65%. b. Cath 02/2017 without acute change, elev LVEDP suggestive of dCHF.  Marland Kitchen Depression   . Dilated aortic root (HCC)    aortic root 47mm and ascending aorta 24mm by echo 10/2016  . ED (erectile dysfunction)   . Environmental allergies    dry cough  . GERD (gastroesophageal reflux disease)   . History of hiatal hernia   . Hydronephrosis of right kidney    a. s/p ureteral stenting in the past.  . Hyperlipidemia  under control  . Hypertension   . Hypothyroidism   . OCD (obsessive compulsive disorder)   . Panic disorder   . PONV (postoperative nausea and vomiting)    after thyroid surgery no problems in 10 years   . Prostate cancer [facility])    prostate cancer- robotic prostatectomy - followed by radiation because of positive lymph node--and pt on lupron injections   . Sinus bradycardia    a. chronic, asymptomatic.  24 hour Holter 10/2016 showed Sinus bradycardia, sinus arrhythmia and normal sinus rhythm with average heart rate 50bpm and heart rate ranged from 32 to 100bpm.  . SUI (stress urinary incontinence), male    S/P ROBOTIC PROSTATECTOMY AND RADIATION TX      PAST SURGICAL HISTORY: Past Surgical History:  Procedure Laterality Date  . COLONOSCOPY  07/2013   Dr. Oletta Lamas  . CORONARY ANGIOPLASTY WITH STENT PLACEMENT  06/20/2014   "2"  . CYSTOSCOPY  11/30/2011   Procedure: CYSTOSCOPY;  Surgeon: Reece Packer, MD;  Location: [facility];  Service: Urology;  Laterality: N/A;  Inplantation of Artificial Sphincter and Cystoscopy  . CYSTOSCOPY WITH RETROGRADE PYELOGRAM, URETEROSCOPY AND STENT PLACEMENT Right 02/11/2014   Procedure: CYSTOSCOPY WITH RETROGRADE PYELOGRAM, URETEROSCOPY ,URETERAL BIOPSY AND STENT PLACEMENT;  Surgeon: Raynelle Bring, MD;  Location: [facility];  Service: Urology;  Laterality: Right;  . CYSTOSCOPY WITH RETROGRADE PYELOGRAM, URETEROSCOPY AND STENT PLACEMENT Right 04/15/2014   Procedure: CYSTOSCOPY WITH RETROGRADE PYELOGRAM, URETEROSCOPY, BIOPSY AND STENT PLACEMENT;  Surgeon: Raynelle Bring, MD;  Location: [facility];  Service: Urology;  Laterality: Right;  . LEFT HEART CATH AND CORONARY ANGIOGRAPHY N/A 03/29/2017   Procedure: LEFT HEART CATH AND CORONARY ANGIOGRAPHY;  Surgeon: Leonie Man, MD;  Location: [facility];  Service: Cardiovascular;  Laterality: N/A;  . LEFT HEART CATHETERIZATION WITH CORONARY ANGIOGRAM N/A 06/20/2014   Procedure: LEFT HEART CATHETERIZATION WITH CORONARY ANGIOGRAM;  Surgeon: Leonie Man, MD;  Location: [facility] CATH LAB;  Service: Cardiovascular;  Laterality: N/A;  . PACEMAKER IMPLANT N/A 05/21/2019   Procedure: PACEMAKER IMPLANT;  Surgeon: Evans Lance, MD;  Location: [place] [facility];  Service: Cardiovascular;  Laterality: N/A;  . PERCUTANEOUS CORONARY STENT INTERVENTION (PCI-S)  06/20/2014   Procedure:  PERCUTANEOUS CORONARY STENT INTERVENTION (PCI-S);  Surgeon: Leonie Man, MD;  Location: [facility] CATH LAB;  Service: Cardiovascular;;  LAD and Circumflex  . REFRACTIVE SURGERY Bilateral 2004  . ROBOT ASSISTED LAPAROSCOPIC RADICAL PROSTATECTOMY  05/2010  . THYROIDECTOMY, PARTIAL  10/1974  . TONSILLECTOMY  1956   . TOTAL THYROIDECTOMY  10/2012   "nuked it"  . UPPER GI ENDOSCOPY     food impaction 2009 done by Dr Oletta Lamas  . URINARY SPHINCTER IMPLANT  11/30/2011   Procedure: ARTIFICIAL URINARY SPHINCTER;  Surgeon: Reece Packer, MD;  Location: [facility];  Service: Urology;  Laterality: N/A;    FAMILY HISTORY:  Family History  Problem Relation Age of Onset  . Cancer Father   . Hyperlipidemia Father   . Heart disease Father        Father had CABG/aorta repair by the time he was in his 59s  . CAD Brother        Brother who is 8 years younger has had some sort of stenting    SOCIAL HISTORY:  Social History   Socioeconomic History  . Marital status: Married    Spouse name: Not on file  . Number of children: Not on file  . Years of education: Not on file  .  Highest education level: Not on file  Occupational History  . Not on file  Tobacco Use  . Smoking status: Never Smoker  . Smokeless tobacco: Never Used  Substance and Sexual Activity  . Alcohol use: Yes    Alcohol/week: 0.0 standard drinks    Comment: 06/20/2014 "1-2 drinks a couple times/month"  . Drug use: No  . Sexual activity: Yes  Other Topics Concern  . Not on file  Social History Narrative  . Not on file   Social Determinants of Health   Financial Resource Strain:   . Difficulty of Paying Living Expenses:   Food Insecurity:   . Worried About Charity fundraiser in the Last Year:   . Arboriculturist in the Last Year:   Transportation Needs:   . Film/video editor (Medical):   Marland Kitchen Lack of Transportation (Non-Medical):   Physical Activity:   . Days of Exercise per Week:   . Minutes of Exercise per Session:     Stress:   . Feeling of Stress :   Social Connections:   . Frequency of Communication with Friends and Family:   . Frequency of Social Gatherings with Friends and Family:   . Attends Religious Services:   . Active Member of Clubs or Organizations:   . Attends Archivist Meetings:   Marland Kitchen Marital Status:   Intimate Partner Violence:   . Fear of Current or Ex-Partner:   . Emotionally Abused:   Marland Kitchen Physically Abused:   . Sexually Abused:     ALLERGIES: Tagamet [cimetidine], Atorvastatin, and Niacin and related  MEDICATIONS:  Current Outpatient Medications  Medication Sig Dispense Refill  . acetaminophen (TYLENOL) 500 MG tablet Take 1,000 mg by mouth every 6 (six) hours as needed (pain).    . Ascorbic Acid (VITAMIN C) 500 MG CAPS Take 500 mg by mouth daily.    Marland Kitchen aspirin EC 81 MG tablet Take 81 mg by mouth daily.    Marland Kitchen buPROPion (WELLBUTRIN XL) 150 MG 24 hr tablet Take 1 tablet (150 mg total) by mouth daily. 90 tablet 1  . Cholecalciferol (VITAMIN D3) 2000 units TABS Take 5,000 Units by mouth daily.     . fenofibrate (TRICOR) 145 MG tablet Take 1 tablet (145 mg total) by mouth daily. 30 tablet 3  . fexofenadine (ALLEGRA) 180 MG tablet Take 180 mg by mouth daily.    . fluvoxaMINE (LUVOX) 50 MG tablet Take 200 mg by mouth every morning.     Marland Kitchen ibuprofen (ADVIL) 200 MG tablet Take 400-800 mg by mouth every 8 (eight) hours as needed for moderate pain.     Marland Kitchen latanoprost (XALATAN) 0.005 % ophthalmic solution Place 1 drop into both eyes at bedtime.     Marland Kitchen leuprolide, 6 Month, (ELIGARD) 45 MG injection Inject 45 mg into the skin every 6 (six) months.    . levothyroxine (SYNTHROID) 175 MCG tablet Take 175 mcg by mouth once.    Marland Kitchen levothyroxine (SYNTHROID) 200 MCG tablet     . lisinopril (ZESTRIL) 5 MG tablet Take 1 tablet (5 mg total) by mouth daily. 90 tablet 3  . Multiple Vitamin (MULTIVITAMIN) capsule Take 1 capsule by mouth daily.    Marland Kitchen MYRBETRIQ 50 MG TB24 tablet Take 50 mg by mouth daily.     . nitroGLYCERIN (NITROSTAT) 0.4 MG SL tablet PLACE 1 TABLET (0.4 MG TOTAL) UNDER THE TONGUE EVERY 5 (FIVE) MINUTES AS NEEDED FOR CHEST PAIN. 25 tablet 4  . pantoprazole (PROTONIX) 40  MG tablet Take 2 tablets (80 mg total) by mouth daily. 180 tablet 3  . rosuvastatin (CRESTOR) 40 MG tablet Take 1 tablet (40 mg total) by mouth daily. 90 tablet 3  . zinc gluconate 50 MG tablet Take 50 mg by mouth daily.    . furosemide (LASIX) 20 MG tablet TAKE 1 TABLET BY MOUTH  DAILY AS NEEDED FOR FLUID  OR EDEMA (Patient not taking: Reported on 09/18/2019) 90 tablet 3   No current facility-administered medications for this encounter.    REVIEW OF SYSTEMS:  On review of systems, the patient reports that he is doing well overall. He denies any chest pain, shortness of breath, cough, fevers, chills, night sweats, unintended weight changes. He denies any bowel or bladder disturbances, and denies abdominal pain, nausea or vomiting. He specifically denies any new musculoskeletal or joint aches or pains.  He reports occasional aching in his back if he sits for a prolonged period of time but otherwise, denies back pain, radiculopathy, paresthesias or focal weakness in the lower extremities.  A complete review of systems is obtained and is otherwise negative.   PHYSICAL EXAM:  Wt Readings from Last 3 Encounters:  08/20/19 260 lb 12.8 oz (118.3 kg)  05/21/19 255 lb (115.7 kg)  05/07/19 263 lb (119.3 kg)   Temp Readings from Last 3 Encounters:  05/21/19 98.5 F (36.9 C) (Skin)  10/31/18 98 F (36.7 C) (Oral)  03/29/17 98.2 F (36.8 C) (Oral)   BP Readings from Last 3 Encounters:  08/20/19 128/88  05/21/19 (!) 159/77  05/07/19 (!) 150/88   Pulse Readings from Last 3 Encounters:  08/20/19 66  05/21/19 (!) 59  05/07/19 (!) 50   Pain Assessment Pain Score: 2  Pain Frequency: Occasional/10  Physical exam not performed in light of telephone consult visit format.   KPS = 100  100 - Normal; no complaints;  no evidence of disease. 90   - Able to carry on normal activity; minor signs or symptoms of disease. 80   - Normal activity with effort; some signs or symptoms of disease. 30   - Cares for self; unable to carry on normal activity or to do active work. 60   - Requires occasional assistance, but is able to care for most of his personal needs. 50   - Requires considerable assistance and frequent medical care. 56   - Disabled; requires special care and assistance. 20   - Severely disabled; hospital admission is indicated although death not imminent. 57   - Very sick; hospital admission necessary; active supportive treatment necessary. 10   - Moribund; fatal processes progressing rapidly. 0     - Dead  Karnofsky DA, Abelmann Pioneer, Craver LS and Burchenal JH [phone]) The use of the nitrogen mustards in the palliative treatment of carcinoma: with particular reference to bronchogenic carcinoma Cancer 1 634-56  LABORATORY DATA:  Lab Results  Component Value Date   WBC 4.8 05/07/2019   HGB 12.0 (L) 05/07/2019   HCT 35.7 (L) 05/07/2019   MCV 88 05/07/2019   PLT 245 05/07/2019   Lab Results  Component Value Date   NA 142 05/07/2019   K 4.5 05/07/2019   CL 106 05/07/2019   CO2 26 05/07/2019   Lab Results  Component Value Date   ALT 24 07/16/2019   AST 18 05/31/2019   ALKPHOS 111 05/31/2019   BILITOT 0.3 05/31/2019     RADIOGRAPHY: NM Bone Scan Whole Body  Result Date: 09/04/2019 CLINICAL DATA:  History of prostate cancer. EXAM: NUCLEAR MEDICINE WHOLE BODY BONE SCAN TECHNIQUE: Whole body anterior and posterior images were obtained approximately 3 hours after intravenous injection of radiopharmaceutical. RADIOPHARMACEUTICALS:  22.0 mCi Technetium-71m MDP IV COMPARISON:  April 08, 2017 FINDINGS: A focus of increased tracer activity is seen within the approximate level of the L3 vertebral body on the right. This represents a new finding when compared to the prior study. Focal asymmetric increased  tracer uptake is also seen within the right ankle, the base of the right great toe and base of the right thumb. This is present on the prior study. The focus of increased tracer uptake seen along the midline of the lower cervical spine on the prior exam is no longer visualized. Mild diffuse symmetric increased tracer uptake is seen within the bilateral shoulders and bilateral knees. Physiologic tracer activity is seen within the bilateral kidneys and urinary bladder. IMPRESSION: 1. Focal increased tracer uptake within the approximate level of the L3 vertebral body on the left. This represents a new finding compared to prior study, dated April 08, 2017 and may be posttraumatic in origin. MRI correlation is recommended, as a solitary metastatic focus cannot be excluded. 2. Stable focal areas of increased tracer uptake within the right ankle, right great toe and right thumb, with degenerative changes noted within the bilateral shoulders and bilateral hips. Electronically Signed   By: Virgina Norfolk M.D.   On: 09/04/2019 20:24   CUP PACEART INCLINIC DEVICE CHECK  Result Date: 08/20/2019 Pacemaker check in clinic. Normal device function. Thresholds, sensing, impedances consistent with previous measurements. Auto-Capture safety margin decreased to 0.8 V. Device programmed to maximize longevity. No mode switch or high ventricular rates noted. Device programmed at appropriate safety margins. Histogram distribution appropriate for patient activity level. Device programmed to optimize intrinsic conduction. Estimated longevity 7 years 7 months. Patient enrolled in remote follow-up 11/19/19.  ROV with GT in 1 year. Patient education completed.Lavenia Atlas, BSN, RN  CUP PACEART REMOTE DEVICE CHECK  Result Date: 08/21/2019 Scheduled remote reviewed. Normal device function.  Next remote 91 days. Kathy Breach, RN, CCDS, [employer]     IMPRESSION/PLAN: This visit was conducted via Telephone to spare the  patient unnecessary potential exposure in the healthcare setting during the current COVID-19 pandemic. 1. 69 y.o. 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 Today, we talked to the patient about the findings and workup thus far. We discussed the natural history of prostate cancer and general treatment, highlighting the role of radiotherapy in the management of oligometastatic disease. We discussed the available radiation techniques, and focused on the details and logistics of delivery regarding stereotactic body radiation.  The recommendation is to proceed with a 5 fraction course of SBRT directed to the metastatic disease at L4.  We reviewed the anticipated acute and late sequelae associated with radiation in this setting.The patient was encouraged to ask questions that were answered to his satisfaction.   At the end of the conversation, the patient appears to have a good understanding of his disease and our treatment recommendations which are of curative intent and he is interested in moving forward with SBRT to L4. He has provided verbal consent to proceed today and is tentatively scheduled for CT simulation on Friday, 09/21/2019, at 2 pm for treatment planning.  He will sign formal written consent at that time and the copy of this document will be placed in his medical record.  We  will share our discussion with Dr. Alinda Money and move forward with treatment planning accordingly.  Given current concerns for patient exposure during the COVID-19 pandemic, this encounter was conducted via telephone. The patient was notified in advance and was offered a MyChart meeting to allow for face to face communication but unfortunately reported that he did not have the appropriate resources/technology to support such a visit and instead preferred to proceed with telephone consult. The patient has given verbal consent for this type of  encounter. The time spent during this encounter was 45 minutes. The attendants for this meeting include Tyler Pita MD, Ashlyn Bruning PA-C, [place], and patient, Shriyans Dilullo. During the encounter, Tyler Pita MD, Ashlyn Bruning PA-C, and scribe, Wilburn Mylar were located at [place].  Patient, Rushawn Polley was located at home.    Nicholos Johns, PA-C    Tyler Pita, MD  [facility] Direct Dial: [phone]  Fax: [phone] [email or web address]  Skype  [email or web address]  This document serves as a record of services personally performed by Tyler Pita, MD and Freeman Caldron, PA-C. It was created on their behalf by Wilburn Mylar, a trained medical scribe. The creation of this record is based on the scribe's personal observations and the provider's statements to them. This document has been checked and approved by the attending provider.

## 2019-09-18 NOTE — Patient Instructions (Signed)
Coronavirus (COVID-19) Are you at risk?  Are you at risk for the Coronavirus (COVID-19)?  To be considered HIGH RISK for Coronavirus (COVID-19), you have to meet the following criteria:  . Traveled to [place], [place], [place], [place] or [place]; or in the [place] to [place], [place], [place], or [place]; and have fever, cough, and shortness of breath within the last 2 weeks of travel OR . Been in close contact with a person diagnosed with COVID-19 within the last 2 weeks and have fever, cough, and shortness of breath . IF YOU DO NOT MEET THESE CRITERIA, YOU ARE CONSIDERED LOW RISK FOR COVID-19.  What to do if you are HIGH RISK for COVID-19?  . If you are having a medical emergency, call 911. . Seek medical care right away. Before you go to a doctor's office, urgent care or emergency department, call ahead and tell them about your recent travel, contact with someone diagnosed with COVID-19, and your symptoms. You should receive instructions from your physician's office regarding next steps of care.  . When you arrive at healthcare provider, tell the healthcare staff immediately you have returned from visiting [place], [place], [place], [place] or [place]; or traveled in the [place] to [place], [place], [place], or [place]; in the last two weeks or you have been in close contact with a person diagnosed with COVID-19 in the last 2 weeks.   . Tell the health care staff about your symptoms: fever, cough and shortness of breath. . After you have been seen by a medical provider, you will be either: o Tested for (COVID-19) and discharged home on quarantine except to seek medical care if symptoms worsen, and asked to  - Stay home and avoid contact with others until you get your results (4-5 days)  - Avoid travel on public transportation if possible (such as bus, train, or airplane) or o Sent to the Emergency Department by EMS for evaluation, COVID-19 testing, and possible  admission depending on your condition and test results.  What to do if you are LOW RISK for COVID-19?  Reduce your risk of any infection by using the same precautions used for avoiding the common cold or flu:  . Wash your hands often with soap and warm water for at least 20 seconds.  If soap and water are not readily available, use an alcohol-based hand sanitizer with at least 60% alcohol.  . If coughing or sneezing, cover your mouth and nose by coughing or sneezing into the elbow areas of your shirt or coat, into a tissue or into your sleeve (not your hands). . Avoid shaking hands with others and consider head nods or verbal greetings only. . Avoid touching your eyes, nose, or mouth with unwashed hands.  . Avoid close contact with people who are sick. . Avoid places or events with large numbers of people in one location, like concerts or sporting events. . Carefully consider travel plans you have or are making. . If you are planning any travel outside or inside the [place], visit the CDC's Travelers' Health webpage for the latest health notices. . If you have some symptoms but not all symptoms, continue to monitor at home and seek medical attention if your symptoms worsen. . If you are having a medical emergency, call 911.   ADDITIONAL HEALTHCARE OPTIONS FOR PATIENTS  [facility] / e-Visit: [email or web address]         [facility]: [phone]  [facility]: [phone]                   [facility]: [phone]   

## 2019-09-19 ENCOUNTER — Other Ambulatory Visit: Payer: Self-pay

## 2019-09-19 NOTE — Progress Notes (Signed)
See progress note under physician encounter. 

## 2019-09-21 ENCOUNTER — Other Ambulatory Visit: Payer: Self-pay

## 2019-09-21 ENCOUNTER — Ambulatory Visit
Admission: RE | Admit: 2019-09-21 | Discharge: 2019-09-21 | Disposition: A | Discharge status: Home / Self Care | Payer: Medicare PPO | Source: Ambulatory Visit | Attending: Radiation Oncology | Admitting: Radiation Oncology

## 2019-09-21 DIAGNOSIS — C7949 Secondary malignant neoplasm of other parts of nervous system: Secondary | ICD-10-CM

## 2019-09-21 DIAGNOSIS — C61 Malignant neoplasm of prostate: Secondary | ICD-10-CM | POA: Diagnosis not present

## 2019-09-21 DIAGNOSIS — C7951 Secondary malignant neoplasm of bone: Secondary | ICD-10-CM | POA: Insufficient documentation

## 2019-09-21 NOTE — Progress Notes (Signed)
  Radiation Oncology         [phone] ________________________________  Name: Blake Carter MRN: [id number]  Date: 09/21/2019  DOB: 23-Aug-1950  STEREOTACTIC BODY RADIOTHERAPY SIMULATION AND TREATMENT PLANNING NOTE    ICD-10-CM   1. Prostate cancer [facility])  C61     DIAGNOSIS:  69 y.o. 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  NARRATIVE:  The patient was brought to the [facility].  Identity was confirmed.  All relevant records and images related to the planned course of therapy were reviewed.  The patient freely provided informed written consent to proceed with treatment after reviewing the details related to the planned course of therapy. The consent form was witnessed and verified by the simulation staff.  Then, the patient was set-up in a stable reproducible  supine position for radiation therapy.  A BodyFix immobilization pillow was fabricated for reproducible positioning.  Surface markings were placed.  The CT images were loaded into the planning software.  The gross target volumes (GTV) and planning target volumes (PTV) were delinieated, and avoidance structures were contoured.  Treatment planning then occurred.  The radiation prescription was entered and confirmed.  A total of two complex treatment devices were fabricated in the form of the BodyFix immobilization pillow and a neck accuform cushion.  I have requested : 3D Simulation  I have requested a DVH of the following structures: targets and all normal structures near the target including Left Kidney, Right Kidney, Bowel, Spinal Cord, Spinal Nerve Roots, Skin and Bowel as noted on the radiation plan to maintain doses in adherence with established limits  PLAN:  The patient will receive 40 Gy in 5 fractions.  ________________________________  Sheral Apley Tammi Klippel, M.D.

## 2019-09-27 DIAGNOSIS — C7949 Secondary malignant neoplasm of other parts of nervous system: Secondary | ICD-10-CM | POA: Insufficient documentation

## 2019-09-27 DIAGNOSIS — C61 Malignant neoplasm of prostate: Secondary | ICD-10-CM | POA: Diagnosis not present

## 2019-09-27 DIAGNOSIS — C7951 Secondary malignant neoplasm of bone: Secondary | ICD-10-CM | POA: Insufficient documentation

## 2019-10-01 ENCOUNTER — Ambulatory Visit: Payer: Medicare PPO | Admitting: Radiation Oncology

## 2019-10-02 ENCOUNTER — Ambulatory Visit: Payer: Medicare PPO | Admitting: Radiation Oncology

## 2019-10-02 ENCOUNTER — Ambulatory Visit
Admission: RE | Admit: 2019-10-02 | Discharge: 2019-10-02 | Disposition: A | Discharge status: Home / Self Care | Payer: Medicare PPO | Source: Ambulatory Visit | Attending: Radiation Oncology | Admitting: Radiation Oncology

## 2019-10-02 ENCOUNTER — Other Ambulatory Visit: Payer: Self-pay

## 2019-10-02 DIAGNOSIS — C7951 Secondary malignant neoplasm of bone: Secondary | ICD-10-CM | POA: Insufficient documentation

## 2019-10-02 DIAGNOSIS — C61 Malignant neoplasm of prostate: Secondary | ICD-10-CM | POA: Diagnosis not present

## 2019-10-03 ENCOUNTER — Ambulatory Visit: Payer: Medicare PPO | Admitting: Radiation Oncology

## 2019-10-04 ENCOUNTER — Other Ambulatory Visit: Payer: Self-pay

## 2019-10-04 ENCOUNTER — Ambulatory Visit
Admission: RE | Admit: 2019-10-04 | Discharge: 2019-10-04 | Disposition: A | Discharge status: Home / Self Care | Payer: Medicare PPO | Source: Ambulatory Visit | Attending: Radiation Oncology | Admitting: Radiation Oncology

## 2019-10-04 ENCOUNTER — Ambulatory Visit: Payer: Medicare PPO | Admitting: Radiation Oncology

## 2019-10-04 DIAGNOSIS — C61 Malignant neoplasm of prostate: Secondary | ICD-10-CM

## 2019-10-04 DIAGNOSIS — C7951 Secondary malignant neoplasm of bone: Secondary | ICD-10-CM | POA: Diagnosis not present

## 2019-10-04 DIAGNOSIS — C7949 Secondary malignant neoplasm of other parts of nervous system: Secondary | ICD-10-CM

## 2019-10-05 ENCOUNTER — Ambulatory Visit: Payer: Medicare PPO | Admitting: Radiation Oncology

## 2019-10-09 ENCOUNTER — Ambulatory Visit
Admission: RE | Admit: 2019-10-09 | Discharge: 2019-10-09 | Disposition: A | Discharge status: Home / Self Care | Payer: Medicare PPO | Source: Ambulatory Visit | Attending: Radiation Oncology | Admitting: Radiation Oncology

## 2019-10-09 ENCOUNTER — Other Ambulatory Visit: Payer: Self-pay

## 2019-10-09 DIAGNOSIS — C61 Malignant neoplasm of prostate: Secondary | ICD-10-CM | POA: Diagnosis not present

## 2019-10-09 DIAGNOSIS — C7951 Secondary malignant neoplasm of bone: Secondary | ICD-10-CM | POA: Diagnosis not present

## 2019-10-11 ENCOUNTER — Other Ambulatory Visit: Payer: Self-pay

## 2019-10-11 ENCOUNTER — Ambulatory Visit (INDEPENDENT_AMBULATORY_CARE_PROVIDER_SITE_OTHER): Payer: Medicare PPO | Admitting: Adult Health

## 2019-10-11 ENCOUNTER — Encounter: Payer: Self-pay | Admitting: Adult Health

## 2019-10-11 ENCOUNTER — Ambulatory Visit
Admission: RE | Admit: 2019-10-11 | Discharge: 2019-10-11 | Disposition: A | Discharge status: Home / Self Care | Payer: Medicare PPO | Source: Ambulatory Visit | Attending: Radiation Oncology | Admitting: Radiation Oncology

## 2019-10-11 DIAGNOSIS — F331 Major depressive disorder, recurrent, moderate: Secondary | ICD-10-CM

## 2019-10-11 DIAGNOSIS — F429 Obsessive-compulsive disorder, unspecified: Secondary | ICD-10-CM

## 2019-10-11 DIAGNOSIS — F411 Generalized anxiety disorder: Secondary | ICD-10-CM

## 2019-10-11 DIAGNOSIS — G4700 Insomnia, unspecified: Secondary | ICD-10-CM

## 2019-10-11 DIAGNOSIS — C7949 Secondary malignant neoplasm of other parts of nervous system: Secondary | ICD-10-CM

## 2019-10-11 DIAGNOSIS — C61 Malignant neoplasm of prostate: Secondary | ICD-10-CM

## 2019-10-11 DIAGNOSIS — C7951 Secondary malignant neoplasm of bone: Secondary | ICD-10-CM | POA: Diagnosis not present

## 2019-10-11 MED ORDER — BUPROPION HCL ER (XL) 150 MG PO TB24: 150.0000 mg | ORAL_TABLET | Freq: Every day | ORAL | 1 refills | Status: DC

## 2019-10-11 MED ORDER — DIAZEPAM 5 MG PO TABS: 5.0000 mg | ORAL_TABLET | Freq: Every day | ORAL | 2 refills | Status: DC

## 2019-10-11 NOTE — Progress Notes (Signed)
Ramesses Southards [id number] [birth year] 69 y.o.  Subjective:   Patient ID:  Blake Carter is a 69 y.o. (DOB 1951/04/15) male.  Chief Complaint: No chief complaint on file.   HPI Blake Carter presents to the office today for follow-up of MDD, GAD, OCD  Describes mood today as "ok". Pleasant. Mood symptoms - denies depression, anxiety, and irritability. Stating "everything seems to be under control". Has returned to church. 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 Undergoing radiation - has 5 visits. Feels fatigued for 3 days after radiation. Wife feels his mood remains stable. Decreased worry and rumination. Stable interest and motivation. Taking medications as prescribed.  Energy levels stable. Active, has a regular exercise routine. Walking dog 3 to 4 miles a day. Retired Forensic psychologist and professor.  Enjoys some usual interests and activities. Married. Lives with wife of  57 years. Has 2 children - 1 son in [place] and 1 daughter in [place]. Spending time with family. Appetite adequate. Weight loss - mostly stable. Sleeping difficulties. Has difficulties getting to sleep. Averages 7 to 9 hours.  Focus and concentration stable. Completing tasks. Managing aspects of household.  Denies SI or HI. Denies AH or VH.  PHQ2-9     CARDIAC REHAB PHASE II EXERCISE from 10/02/2014 in [place] from 07/08/2014 in [place]  PHQ-2 Total Score  0  0       Review of Systems:  Review of Systems  Musculoskeletal: Negative for gait problem.  Neurological: Negative for tremors.  Psychiatric/Behavioral:       Please refer to HPI    Medications: I have reviewed the patient's current medications.  Current Outpatient Medications  Medication Sig Dispense Refill  . acetaminophen (TYLENOL) 500 MG tablet Take 1,000 mg by mouth every 6 (six) hours as needed (pain).    . Ascorbic Acid  (VITAMIN C) 500 MG CAPS Take 500 mg by mouth daily.    Marland Kitchen aspirin EC 81 MG tablet Take 81 mg by mouth daily.    Marland Kitchen buPROPion (WELLBUTRIN XL) 150 MG 24 hr tablet Take 1 tablet (150 mg total) by mouth daily. 90 tablet 1  . Cholecalciferol (VITAMIN D3) 2000 units TABS Take 5,000 Units by mouth daily.     . diazepam (VALIUM) 5 MG tablet Take 1 tablet (5 mg total) by mouth at bedtime. 30 tablet 2  . fenofibrate (TRICOR) 145 MG tablet Take 1 tablet (145 mg total) by mouth daily. 30 tablet 3  . fexofenadine (ALLEGRA) 180 MG tablet Take 180 mg by mouth daily.    . fluvoxaMINE (LUVOX) 50 MG tablet Take 200 mg by mouth every morning.     . furosemide (LASIX) 20 MG tablet TAKE 1 TABLET BY MOUTH  DAILY AS NEEDED FOR FLUID  OR EDEMA (Patient not taking: Reported on 09/18/2019) 90 tablet 3  . ibuprofen (ADVIL) 200 MG tablet Take 400-800 mg by mouth every 8 (eight) hours as needed for moderate pain.     Marland Kitchen latanoprost (XALATAN) 0.005 % ophthalmic solution Place 1 drop into both eyes at bedtime.     Marland Kitchen leuprolide, 6 Month, (ELIGARD) 45 MG injection Inject 45 mg into the skin every 6 (six) months.    . levothyroxine (SYNTHROID) 175 MCG tablet Take 175 mcg by mouth once.    Marland Kitchen levothyroxine (SYNTHROID) 200 MCG tablet     . lisinopril (ZESTRIL) 5 MG tablet Take 1 tablet (  5 mg total) by mouth daily. 90 tablet 3  . Multiple Vitamin (MULTIVITAMIN) capsule Take 1 capsule by mouth daily.    Marland Kitchen MYRBETRIQ 50 MG TB24 tablet Take 50 mg by mouth daily.    . nitroGLYCERIN (NITROSTAT) 0.4 MG SL tablet PLACE 1 TABLET (0.4 MG TOTAL) UNDER THE TONGUE EVERY 5 (FIVE) MINUTES AS NEEDED FOR CHEST PAIN. 25 tablet 4  . pantoprazole (PROTONIX) 40 MG tablet Take 2 tablets (80 mg total) by mouth daily. 180 tablet 3  . rosuvastatin (CRESTOR) 40 MG tablet Take 1 tablet (40 mg total) by mouth daily. 90 tablet 3  . zinc gluconate 50 MG tablet Take 50 mg by mouth daily.     No current facility-administered medications for this visit.     Medication Side Effects: None  Allergies:  Allergies  Allergen Reactions  . Tagamet [Cimetidine] Other (See Comments)    Unknown - over 30 years ago (2021) - unsure what occurred it may have been hives or flushing  . Atorvastatin     Myalgias on 80mg  dose  . Niacin And Related Other (See Comments)    Flushing     Past Medical History:  Diagnosis Date  . Allergic rhinitis   . Arthritis    "a little bit in some of my joints" (06/20/2014)  . Chronic diastolic CHF (congestive heart failure) ([place])   . Coronary artery disease    a. 05/2014 Cath/PCI: LM nl, LAD 40p, 60-70d (FFR 0.80->3.0x24 Synergy DES), D2 40, LCX 90d (2.25x12 Synergy DES), RCA 20-72m, EF 60-65%. b. Cath 02/2017 without acute change, elev LVEDP suggestive of dCHF.  Marland Kitchen Depression   . Dilated aortic root (HCC)    aortic root 69mm and ascending aorta 63mm by echo 10/2016  . ED (erectile dysfunction)   . Environmental allergies    dry cough  . GERD (gastroesophageal reflux disease)   . History of hiatal hernia   . Hydronephrosis of right kidney    a. s/p ureteral stenting in the past.  . Hyperlipidemia    under control  . Hypertension   . Hypothyroidism   . OCD (obsessive compulsive disorder)   . Panic disorder   . PONV (postoperative nausea and vomiting)    after thyroid surgery no problems in 10 years   . Prostate cancer [facility])    prostate cancer- robotic prostatectomy - followed by radiation because of positive lymph node--and pt on lupron injections  . Sinus bradycardia    a. chronic, asymptomatic.  24 hour Holter 10/2016 showed Sinus bradycardia, sinus arrhythmia and normal sinus rhythm with average heart rate 50bpm and heart rate ranged from 32 to 100bpm.  . SUI (stress urinary incontinence), male    S/P ROBOTIC PROSTATECTOMY AND RADIATION TX    Family History  Problem Relation Age of Onset  . Cancer Father   . Hyperlipidemia Father   . Heart disease Father        Father had CABG/aorta repair by the  time he was in his 48s  . CAD Brother        Brother who is 23 years younger has had some sort of stenting    Social History   Socioeconomic History  . Marital status: Married    Spouse name: Not on file  . Number of children: Not on file  . Years of education: Not on file  . Highest education level: Not on file  Occupational History  . Not on file  Tobacco Use  . Smoking status:  Never Smoker  . Smokeless tobacco: Never Used  Substance and Sexual Activity  . Alcohol use: Yes    Alcohol/week: 0.0 standard drinks    Comment: 06/20/2014 "1-2 drinks a couple times/month"  . Drug use: No  . Sexual activity: Yes  Other Topics Concern  . Not on file  Social History Narrative  . Not on file   Social Determinants of Health   Financial Resource Strain:   . Difficulty of Paying Living Expenses:   Food Insecurity:   . Worried About Charity fundraiser in the Last Year:   . Arboriculturist in the Last Year:   Transportation Needs:   . Film/video editor (Medical):   Marland Kitchen Lack of Transportation (Non-Medical):   Physical Activity:   . Days of Exercise per Week:   . Minutes of Exercise per Session:   Stress:   . Feeling of Stress :   Social Connections:   . Frequency of Communication with Friends and Family:   . Frequency of Social Gatherings with Friends and Family:   . Attends Religious Services:   . Active Member of Clubs or Organizations:   . Attends Archivist Meetings:   Marland Kitchen Marital Status:   Intimate Partner Violence:   . Fear of Current or Ex-Partner:   . Emotionally Abused:   Marland Kitchen Physically Abused:   . Sexually Abused:     Past Medical History, Surgical history, Social history, and Family history were reviewed and updated as appropriate.   Please see review of systems for further details on the patient's review from today.   Objective:   Physical Exam:  There were no vitals taken for this visit.  Physical Exam Constitutional:      General: He is not  in acute distress. Musculoskeletal:        General: No deformity.  Neurological:     Mental Status: He is alert and oriented to person, place, and time.     Coordination: Coordination normal.  Psychiatric:        Attention and Perception: Attention and perception normal. He does not perceive auditory or visual hallucinations.        Mood and Affect: Mood normal. Mood is not anxious or depressed. Affect is not labile, blunt, angry or inappropriate.        Speech: Speech normal.        Behavior: Behavior normal.        Thought Content: Thought content normal. Thought content is not paranoid or delusional. Thought content does not include homicidal or suicidal ideation. Thought content does not include homicidal or suicidal plan.        Cognition and Memory: Cognition and memory normal.        Judgment: Judgment normal.     Comments: Insight intact    Lab Review:     Component Value Date/Time   NA 142 05/07/2019 1319   K 4.5 05/07/2019 1319   CL 106 05/07/2019 1319   CO2 26 05/07/2019 1319   GLUCOSE 106 (H) 05/07/2019 1319   GLUCOSE 98 10/31/2018 1247   BUN 21 05/07/2019 1319   CREATININE 0.88 05/07/2019 1319   CREATININE 0.92 10/31/2018 1247   CREATININE 1.07 10/14/2015 0850   CALCIUM 9.6 05/07/2019 1319   PROT 7.4 05/31/2019 0820   ALBUMIN 5.0 (H) 05/31/2019 0820   AST 18 05/31/2019 0820   AST 19 10/31/2018 1247   ALT 24 07/16/2019 0943   ALT 25 10/31/2018 1247  ALKPHOS 111 05/31/2019 0820   BILITOT 0.3 05/31/2019 0820   BILITOT 0.3 10/31/2018 1247   GFRNONAA 89 05/07/2019 1319   GFRNONAA >60 10/31/2018 1247   GFRAA 103 05/07/2019 1319   GFRAA >60 10/31/2018 1247       Component Value Date/Time   WBC 4.8 05/07/2019 1319   WBC 5.3 10/31/2018 1247   WBC 7.0 03/29/2017 0554   RBC 4.07 (L) 05/07/2019 1319   RBC 4.02 (L) 10/31/2018 1247   HGB 12.0 (L) 05/07/2019 1319   HCT 35.7 (L) 05/07/2019 1319   PLT 245 05/07/2019 1319   MCV 88 05/07/2019 1319   MCH 29.5  05/07/2019 1319   MCH 29.1 10/31/2018 1247   MCHC 33.6 05/07/2019 1319   MCHC 32.3 10/31/2018 1247   RDW 13.9 05/07/2019 1319   LYMPHSABS 1.7 05/07/2019 1319   MONOABS 0.4 10/31/2018 1247   EOSABS 0.2 05/07/2019 1319   BASOSABS 0.0 05/07/2019 1319    No results found for: POCLITH, LITHIUM   No results found for: PHENYTOIN, PHENOBARB, VALPROATE, CBMZ   .res Assessment: Plan:    Plan:  PDMP reviewed  1. Luvox 200mg  daily 2. Wellbutrin XL 150mg  daily  3. Add Valium 5mg  at hs - for sleepless nights  Consider therapy  RTC 2 months  Patient advised to contact office with any questions, adverse effects, or acute worsening in signs and symptoms.  Diagnoses and all orders for this visit:  Obsessive-compulsive disorder, unspecified type -     buPROPion (WELLBUTRIN XL) 150 MG 24 hr tablet; Take 1 tablet (150 mg total) by mouth daily.  Generalized anxiety disorder -     buPROPion (WELLBUTRIN XL) 150 MG 24 hr tablet; Take 1 tablet (150 mg total) by mouth daily.  Major depressive disorder, recurrent episode, moderate (HCC) -     buPROPion (WELLBUTRIN XL) 150 MG 24 hr tablet; Take 1 tablet (150 mg total) by mouth daily.  Insomnia, unspecified type -     diazepam (VALIUM) 5 MG tablet; Take 1 tablet (5 mg total) by mouth at bedtime.     Please see After Visit Summary for patient specific instructions.  Future Appointments  Date Time Provider [place]  10/15/2019 11:30 AM Tyler Pita, MD [facility] None  10/22/2019  7:30 AM [facility]  10/24/2019  8:30 AM [facility] PHARMACIST [facility]  11/19/2019  7:05 AM [facility] DEVICE REMOTES [facility]  01/10/2020  8:40 AM Damarien Nyman, Berdie Ogren, NP [facility] None  02/18/2020  7:05 AM [facility] DEVICE REMOTES [facility]  05/19/2020  7:05 AM [facility] DEVICE REMOTES [facility]    No orders of the defined types were placed in this  encounter.   -------------------------------

## 2019-10-15 ENCOUNTER — Other Ambulatory Visit: Payer: Self-pay

## 2019-10-15 ENCOUNTER — Ambulatory Visit
Admission: RE | Admit: 2019-10-15 | Discharge: 2019-10-15 | Disposition: A | Discharge status: Home / Self Care | Payer: Medicare PPO | Source: Ambulatory Visit | Attending: Radiation Oncology | Admitting: Radiation Oncology

## 2019-10-15 ENCOUNTER — Encounter: Payer: Self-pay | Admitting: Radiation Oncology

## 2019-10-15 DIAGNOSIS — C61 Malignant neoplasm of prostate: Secondary | ICD-10-CM | POA: Diagnosis not present

## 2019-10-15 DIAGNOSIS — C7951 Secondary malignant neoplasm of bone: Secondary | ICD-10-CM | POA: Diagnosis not present

## 2019-10-22 ENCOUNTER — Other Ambulatory Visit: Payer: Self-pay

## 2019-10-22 ENCOUNTER — Telehealth: Payer: Self-pay

## 2019-10-22 ENCOUNTER — Other Ambulatory Visit: Payer: Medicare PPO | Admitting: *Deleted

## 2019-10-22 DIAGNOSIS — E7800 Pure hypercholesterolemia, unspecified: Secondary | ICD-10-CM | POA: Diagnosis not present

## 2019-10-22 DIAGNOSIS — E890 Postprocedural hypothyroidism: Secondary | ICD-10-CM | POA: Diagnosis not present

## 2019-10-22 DIAGNOSIS — R0602 Shortness of breath: Secondary | ICD-10-CM

## 2019-10-22 LAB — HEPATIC FUNCTION PANEL
ALT: 20 IU/L (ref 0–44)
AST: 23 IU/L (ref 0–40)
Albumin: 4.5 g/dL (ref 3.8–4.8)
Alkaline Phosphatase: 69 IU/L (ref 48–121)
Bilirubin Total: 0.2 mg/dL (ref 0.0–1.2)
Bilirubin, Direct: 0.07 mg/dL (ref 0.00–0.40)
Total Protein: 6.9 g/dL (ref 6.0–8.5)

## 2019-10-22 LAB — LIPID PANEL
Chol/HDL Ratio: 2.7 ratio (ref 0.0–5.0)
Cholesterol, Total: 120 mg/dL (ref 100–199)
HDL: 45 mg/dL (ref >39)
LDL Chol Calc (NIH): 47 mg/dL (ref 0–99)
Triglycerides: 169 mg/dL — ABNORMAL HIGH (ref 0–149)
VLDL Cholesterol Cal: 28 mg/dL (ref 5–40)

## 2019-10-22 NOTE — Telephone Encounter (Signed)
Spoke with the patient who states that he has been dealing with a tumor that was found on his spine back in April so he knows that he has been eating poorly. He states that he is going to work on it.   He also states that he has noticed in the past couple of months that he has a lot of trouble walking up stairs. He becomes very short of breath and has to rest after walking up 1-2 flights. He states that he does not have any chest pain or other associated symptoms. He reports that he can walk his dog 3-4 miles with no problem at all and this only happens with stairs. He states that Dr. Radford Pax had discussed possible COPD with him previously and he is wondering if he needs to be referred to a pulmonologist.

## 2019-10-22 NOTE — Telephone Encounter (Signed)
Refer to Dr. Chase Caller

## 2019-10-22 NOTE — Telephone Encounter (Signed)
-----   Message from Sueanne Margarita, MD sent at 10/22/2019  4:44 PM EDT ----- Triglycerides are back up - please have him cut back on carb, fatty foods, sugars and ETOH.  REpeat FLP in 4 months

## 2019-10-23 NOTE — Telephone Encounter (Signed)
Referral placed for patient to see Dr. Chase Caller.

## 2019-10-24 ENCOUNTER — Ambulatory Visit: Payer: Medicare PPO

## 2019-10-25 ENCOUNTER — Ambulatory Visit (INDEPENDENT_AMBULATORY_CARE_PROVIDER_SITE_OTHER): Payer: Medicare PPO | Admitting: Pharmacist

## 2019-10-25 ENCOUNTER — Other Ambulatory Visit: Payer: Self-pay

## 2019-10-25 VITALS — BP 154/92 | HR 66

## 2019-10-25 DIAGNOSIS — I10 Essential (primary) hypertension: Secondary | ICD-10-CM

## 2019-10-25 DIAGNOSIS — E7800 Pure hypercholesterolemia, unspecified: Secondary | ICD-10-CM

## 2019-10-25 NOTE — Patient Instructions (Addendum)
It was a pleasure to meet you!  Please increase your lisinopril to 10mg  daily (2x 5mg  tablets)  Please check your blood once a day and record the readings. Bring your blood pressure meter with you.  Before checking your blood pressure make sure: You are seated and quite for 5 min before checking Feet are flat on the floor Siting in chair with your back supported straight up and down Arm resting on table or arm of chair at heart level Bladder is empty You have NOT had caffeine or tobacco within the last 30 min  Check your blood pressure 2-3 times about 1-2 min apart. Usually the first reading will be the highest. Record these readings.  Only check your blood pressure once a day, unless otherwise directed Record your blood pressure readings and bring them to all your appointments. If your meter stores your readings in its memory, then you may bring your blood pressure meter with you to your appointments.  You can find a list of validated (accurate) blood pressure cuffs at [employer].it  Lifestyle changes can make a world of difference and are even more important than medications: Try to keep your sodium intake to 2300 mg of sodium per day Get 6-8 uninterrupted hours of sleep per night Aim for a goal of 150 min of moderate aerobic exercise (ie brisk walking, bike riding) per week

## 2019-10-25 NOTE — Progress Notes (Signed)
Patient ID: Blake Carter                 DOB: [birth year]                    MRN: [id number]     HPI: Blake Carter is a 70 y.o. male patient referred to PharmD by Dr. Radford Pax. PMH is significant for hx of CAD (DES to LCx 05/2014 with residual LAD disease with repeat cath 02/2017 with patent stents and otherwise mild nonobstructive disease), HTN, dyslipidemia, sinus bradycardia, dilated aortic root (29mm by echo 10/2018), chronic diastolic CHF, allergic rhinits, GERD, hypothyroidism, hx of prostate cancer, hx of hydronephrosis of right kidney (s/p ureteral stenting), depression, and insomnia. Patient's LDL had increased from 68 mg/dL in 01/2019 to 89 mg/dL in 05/2019. Dr. Radford Pax increased patient's atorvastatin from 40 mg daily to 80 mg daily. Patient contacted clinic on 08/09/2019 stating he has been experiencing leg weakness ever since atorvastatin dosage increase. He is concerned since he has read this is a side effect of statin medication. He mentioned he did not experience leg weakness with atorvastatin 40 mg dose and he has increased his exercise significantly (50 minutes of walking 5-6x per week).  At previous clinic appointment patients atorvastatin was changed to rosuvasatin 40mg  and gemfibrozil was changed to fenofibrate. Patient stated in the past TG have been > 1000. He was adamant about not wanting to take an injectable medication. He was concerned regarding fish oil use in correlation to prostate cancer. Advised we would look into the correlation. Last scr 0.50 on 08/27/19.  Patient presents today for follow up. His main concern is his blood pressure. States it has gone up at 15 points top and bottom since he started taking Wellbutrin. States the Wellbutrin is working very well for him and he doesn't want to change. He does drink 2-3 propell that has Na in it, but this is not new. He denies dizziness, lightheadedness, headache, blurred vision, SOB (on with steps) or swelling. Thinks his TG are  up slightly due to his diet. He was diagnosed with a tumor from prostate cancer and had to have radiation. He ate a lot of desserts due to being upset about this. Still walking his dog every morning at least 25min.   Current Lipid Medications:rosuvastatin 40mg  daily, fenofibrate 145mg  daily Current HTN medications: lisinopril 5mg  daily, furosemide 20mg  prn swelling Intolerances: none Risk Factors: ASCVD (CAD s/p DES and repeat cath in 02/2017 with patent stents), HTN, HLD, familial cardiac history LDL goal: <70 mg/dL  Diet: 3 or 4 meals per day (small portions) -Breakfast: eggs with cereal/skim milk -Lunch: sandwich -Dinner: chicken, red meat, pork, lamb, whole wheat pasta/brown rice/beans, states he could eat more vegetables -Snacks: fruit -Desert: occasional (1x per month) -Fried food: once every 6 weeks -Fast food: very rarely (in the morning - egg McMuffin from [employer]) - once a month -Drinks: water, sugar free Powerade, "not too much of soda" - when he drinks he drinks sugar free  Exercise: 6 out 7 days, walks dog ~2 miles for 50 minutes each day   Family History: father (cancer (died from small lung cancer per pt report), heart disease ("multiple bypasses", "aorta replaced"), HLD); brother (CAD); "everyone on my dad's side had some kind of coronary disease / MI / heart failure"; grandmother (elevated TG)  Social History: never used tobacco, occasional alcohol use (1x or 2x per month)  Labs: 10/22/2019:TC 120 TG 169 VLDL 28 HDL 45 LDL 47; rosuvastatin  40mg  daily, fenofibrate 145mg  daily 07/16/2019 TC 157 TG 124 VLDL 22 HDL 46 LDL 89; atorvastatin 80 mg daily, gemfibrozil 600 mg daily 05/31/2019 TC 172 TG 164 VLDL 28 HDL 47 LDL 97; atorvastatin 40 mg daily, gemfibrozil 600 mg daily 01/30/2019 TC 137 TG175 VLDL 30 HDL 39 LDL 73; atorvastatin 40 mg daily, gemfibrozil 600 mg daily  Home blood pressure: 140/90  Past Medical History:  Diagnosis Date  . Allergic rhinitis   . Arthritis     "a little bit in some of my joints" (06/20/2014)  . Chronic diastolic CHF (congestive heart failure) ([place])   . Coronary artery disease    a. 05/2014 Cath/PCI: LM nl, LAD 40p, 60-70d (FFR 0.80->3.0x24 Synergy DES), D2 40, LCX 90d (2.25x12 Synergy DES), RCA 20-76m, EF 60-65%. b. Cath 02/2017 without acute change, elev LVEDP suggestive of dCHF.  Marland Kitchen Depression   . Dilated aortic root (HCC)    aortic root 76mm and ascending aorta 69mm by echo 10/2016  . ED (erectile dysfunction)   . Environmental allergies    dry cough  . GERD (gastroesophageal reflux disease)   . History of hiatal hernia   . Hydronephrosis of right kidney    a. s/p ureteral stenting in the past.  . Hyperlipidemia    under control  . Hypertension   . Hypothyroidism   . OCD (obsessive compulsive disorder)   . Panic disorder   . PONV (postoperative nausea and vomiting)    after thyroid surgery no problems in 10 years   . Prostate cancer [facility])    prostate cancer- robotic prostatectomy - followed by radiation because of positive lymph node--and pt on lupron injections  . Sinus bradycardia    a. chronic, asymptomatic.  24 hour Holter 10/2016 showed Sinus bradycardia, sinus arrhythmia and normal sinus rhythm with average heart rate 50bpm and heart rate ranged from 32 to 100bpm.  . SUI (stress urinary incontinence), male    S/P ROBOTIC PROSTATECTOMY AND RADIATION TX    Current Outpatient Medications on File Prior to Visit  Medication Sig Dispense Refill  . acetaminophen (TYLENOL) 500 MG tablet Take 1,000 mg by mouth every 6 (six) hours as needed (pain).    . Ascorbic Acid (VITAMIN C) 500 MG CAPS Take 500 mg by mouth daily.    Marland Kitchen aspirin EC 81 MG tablet Take 81 mg by mouth daily.    Marland Kitchen buPROPion (WELLBUTRIN XL) 150 MG 24 hr tablet Take 1 tablet (150 mg total) by mouth daily. 90 tablet 1  . Cholecalciferol (VITAMIN D3) 2000 units TABS Take 5,000 Units by mouth daily.     . diazepam (VALIUM) 5 MG tablet Take 1 tablet (5 mg total)  by mouth at bedtime. 30 tablet 2  . fenofibrate (TRICOR) 145 MG tablet Take 1 tablet (145 mg total) by mouth daily. 30 tablet 3  . fexofenadine (ALLEGRA) 180 MG tablet Take 180 mg by mouth daily.    . fluvoxaMINE (LUVOX) 50 MG tablet Take 200 mg by mouth every morning.     . furosemide (LASIX) 20 MG tablet TAKE 1 TABLET BY MOUTH  DAILY AS NEEDED FOR FLUID  OR EDEMA (Patient not taking: Reported on 09/18/2019) 90 tablet 3  . ibuprofen (ADVIL) 200 MG tablet Take 400-800 mg by mouth every 8 (eight) hours as needed for moderate pain.     Marland Kitchen latanoprost (XALATAN) 0.005 % ophthalmic solution Place 1 drop into both eyes at bedtime.     Marland Kitchen leuprolide, 6 Month, (ELIGARD) 45 MG  injection Inject 45 mg into the skin every 6 (six) months.    . levothyroxine (SYNTHROID) 175 MCG tablet Take 175 mcg by mouth once.    Marland Kitchen levothyroxine (SYNTHROID) 200 MCG tablet     . lisinopril (ZESTRIL) 5 MG tablet Take 1 tablet (5 mg total) by mouth daily. 90 tablet 3  . Multiple Vitamin (MULTIVITAMIN) capsule Take 1 capsule by mouth daily.    Marland Kitchen MYRBETRIQ 50 MG TB24 tablet Take 50 mg by mouth daily.    . nitroGLYCERIN (NITROSTAT) 0.4 MG SL tablet PLACE 1 TABLET (0.4 MG TOTAL) UNDER THE TONGUE EVERY 5 (FIVE) MINUTES AS NEEDED FOR CHEST PAIN. 25 tablet 4  . pantoprazole (PROTONIX) 40 MG tablet Take 2 tablets (80 mg total) by mouth daily. 180 tablet 3  . rosuvastatin (CRESTOR) 40 MG tablet Take 1 tablet (40 mg total) by mouth daily. 90 tablet 3  . zinc gluconate 50 MG tablet Take 50 mg by mouth daily.     No current facility-administered medications on file prior to visit.    Allergies  Allergen Reactions  . Tagamet [Cimetidine] Other (See Comments)    Unknown - over 30 years ago (2021) - unsure what occurred it may have been hives or flushing  . Atorvastatin     Myalgias on 80mg  dose  . Niacin And Related Other (See Comments)    Flushing     Assessment/Plan:  1. Hyperlipidemia - LDL is at goal of <70. TG slight elevated  but patient admits to a diet high in sugar. He will limit his sweets and carbs. No need for mediation adjustments at this time.  2. Hypertension- Blood pressure is above goal of <130/80 in clinic and at home. Renal function was good in march. Will increase lisinopril to 10mg  daily and follow up in the clinic in [redacted] weeks along with a BMP.  Thank you for involving pharmacy to assist in providing this patient's care.   Ramond Dial, Pharm.D, BCPS, CPP [place]  [id number] [place], [place] [address]  Phone: [phone]; Fax: [phone]

## 2019-11-07 NOTE — Progress Notes (Addendum)
Patient ID: Blake Carter                 DOB: [birth year]                      MRN: [id number]     HPI: Blake Carter is a 69 y.o. male referred by Dr. Radford Pax to HTN clinic. PMH is significant for hx of CAD (DES to LCx 05/2014 with residual LAD disease with repeat cath 02/2017 with patent stents and otherwise mild nonobstructive disease), HTN, dyslipidemia, sinus bradycardia, dilated aortic root (36mm by echo 10/2018), chronic diastolic CHF, allergic rhinits, GERD, hypothyroidism, hx of prostate cancer, hx of hydronephrosis of right kidney (s/p ureteral stenting), depression, and insomnia. Patient's LDL had increased from 68 mg/dL in 01/2019 to 89 mg/dL in 05/2019. Dr. Radford Pax increased patient's atorvastatin from 40 mg daily to 80 mg daily. Patient contacted clinic on 08/09/2019 stating he has been experiencing leg weakness ever since atorvastatin dosage increase. He is concerned since he has read this is a side effect of statin medication. He mentioned he did not experience leg weakness with atorvastatin 40 mg dose and he has increased his exercise significantly (50 minutes of walking 5-6x per week).  At prior clinic appointment patient's atorvastatin was changed to rosuvasatin 40mg  and gemfibrozil was changed to fenofibrate. Patient stated in the past TG have been > 1000. He was adamant about not wanting to take an injectable medication. He was concerned regarding fish oil use in correlation to prostate cancer.Although we did clarify that the increased risk was only seen in [place] products.  At last HTN clinic on 10/25/19, patient presented for follow up. His main concern was his blood pressure. Stated it had gone up by 15 points top and bottom since he started taking Wellbutrin. Stated the Wellbutrin was working very well for him and he didn't want to change. He was drink 2-3 propell that had sodium in it, but this was not new. He denied dizziness, lightheadedness, headache, blurred vision, SOB (on with  steps) and swelling. He thought his TGs were up slightly due to his diet. He was diagnosed with a tumor from prostate cancer and had to have radiation. He ate a lot of desserts due to being upset about this. He was staying active by walking his dog every morning at least 25min. BP was elevated at 154/92 at this visit. Lisinopril was increased to 10mg  daily.  Patient presents today in good spirits for BP follow up. BP in clinic today was 154/96 manually and 148/98 with his home BP monitor. His home BP readings have been elevated lately. He has been tolerating his lisinopril without issues and has not had any dizziness, lightheadedness, or fatigue. He does also note that he has been taking his furosemide 20mg  daily instead of PRN and has noticed an improvement in SOB.   He does endorse that he wants to control his BP and had success with lisinopril 20mg  daily and amlodipine 5mg  daily in the past. He was taken off amlodipine since his BP got too low but thinks it would help him to restart it at this time.   Current Lipid Medications: rosuvastatin 40mg  daily, fenofibrate 145mg  daily Current HTN medications: lisinopril 10mg  daily, furosemide 20mg  Intolerances: none Risk Factors: ASCVD (CAD s/p DES and repeat cath in 02/2017 with patent stents), HTN, HLD, familial cardiac history LDL goal: <70 mg/dL BP goal: <130/80  Family History: father (cancer (died from small lung cancer per pt report),  heart disease ("multiple bypasses", "aorta replaced"), HLD); brother (CAD); "everyone on my dad's side had some kind of coronary disease / MI / heart failure"; grandmother (elevated TG)  Social History: never used tobacco, occasional alcohol use (1x or 2x per month)  Diet:  Breakfast: eggs with cheese and ham on an english muffin Lunch: salad or sandwich (sometimes skips lunch Dinner: protein with vegetables with a starch  Beverages: drinks mostly water or propel (2x per day in 1 32oz water) Does not use added  salt on foods (Uses Mrs Deliah Boston instead if needed) Only goes out to eat 1-2 times per week  Exercise: walks 30 minutes to 1 hr 5-6 times per week  Home BP readings: 146/85, 160/90, 162/92, 165/95, 146/89, 158/93, 172/93, 164/98, 148/84, 155/98  Labs: 10/22/2019:TC 120 TG 169 VLDL 28 HDL 45 LDL 47; rosuvastatin 40mg  daily, fenofibrate 145mg  daily 07/16/2019 TC 157 TG 124 VLDL 22 HDL 46 LDL 89; atorvastatin 80 mg daily, gemfibrozil 600 mg daily 05/31/2019 TC 172 TG 164 VLDL 28 HDL 47 LDL 97; atorvastatin 40 mg daily, gemfibrozil 600 mg daily 01/30/2019 TC 137 TG175 VLDL 30 HDL 39 LDL 73; atorvastatin 40 mg daily, gemfibrozil 600 mg daily  Wt Readings from Last 3 Encounters:  08/20/19 260 lb 12.8 oz (118.3 kg)  05/21/19 255 lb (115.7 kg)  05/07/19 263 lb (119.3 kg)   BP Readings from Last 3 Encounters:  10/25/19 (!) 154/92  08/20/19 128/88  05/21/19 (!) 159/77   Pulse Readings from Last 3 Encounters:  10/25/19 66  08/20/19 66  05/21/19 (!) 59    Renal function: CrCl cannot be calculated (Patient's most recent lab result is older than the maximum 21 days allowed.).  Past Medical History:  Diagnosis Date  . Allergic rhinitis   . Arthritis    "a little bit in some of my joints" (06/20/2014)  . Chronic diastolic CHF (congestive heart failure) ([place])   . Coronary artery disease    a. 05/2014 Cath/PCI: LM nl, LAD 40p, 60-70d (FFR 0.80->3.0x24 Synergy DES), D2 40, LCX 90d (2.25x12 Synergy DES), RCA 20-92m, EF 60-65%. b. Cath 02/2017 without acute change, elev LVEDP suggestive of dCHF.  Marland Kitchen Depression   . Dilated aortic root (HCC)    aortic root 63mm and ascending aorta 16mm by echo 10/2016  . ED (erectile dysfunction)   . Environmental allergies    dry cough  . GERD (gastroesophageal reflux disease)   . History of hiatal hernia   . Hydronephrosis of right kidney    a. s/p ureteral stenting in the past.  . Hyperlipidemia    under control  . Hypertension   . Hypothyroidism   . OCD  (obsessive compulsive disorder)   . Panic disorder   . PONV (postoperative nausea and vomiting)    after thyroid surgery no problems in 10 years   . Prostate cancer [facility])    prostate cancer- robotic prostatectomy - followed by radiation because of positive lymph node--and pt on lupron injections  . Sinus bradycardia    a. chronic, asymptomatic.  24 hour Holter 10/2016 showed Sinus bradycardia, sinus arrhythmia and normal sinus rhythm with average heart rate 50bpm and heart rate ranged from 32 to 100bpm.  . SUI (stress urinary incontinence), male    S/P ROBOTIC PROSTATECTOMY AND RADIATION TX    Current Outpatient Medications on File Prior to Visit  Medication Sig Dispense Refill  . acetaminophen (TYLENOL) 500 MG tablet Take 1,000 mg by mouth every 6 (six) hours as needed (pain).    Marland Kitchen  Ascorbic Acid (VITAMIN C) 500 MG CAPS Take 500 mg by mouth daily.    Marland Kitchen aspirin EC 81 MG tablet Take 81 mg by mouth daily.    Marland Kitchen buPROPion (WELLBUTRIN XL) 150 MG 24 hr tablet Take 1 tablet (150 mg total) by mouth daily. 90 tablet 1  . Cholecalciferol (VITAMIN D3) 2000 units TABS Take 5,000 Units by mouth daily.     . diazepam (VALIUM) 5 MG tablet Take 1 tablet (5 mg total) by mouth at bedtime. 30 tablet 2  . fenofibrate (TRICOR) 145 MG tablet Take 1 tablet (145 mg total) by mouth daily. 30 tablet 3  . fexofenadine (ALLEGRA) 180 MG tablet Take 180 mg by mouth daily.    . fluvoxaMINE (LUVOX) 50 MG tablet Take 200 mg by mouth every morning.     . furosemide (LASIX) 20 MG tablet TAKE 1 TABLET BY MOUTH  DAILY AS NEEDED FOR FLUID  OR EDEMA (Patient not taking: Reported on 09/18/2019) 90 tablet 3  . ibuprofen (ADVIL) 200 MG tablet Take 400-800 mg by mouth every 8 (eight) hours as needed for moderate pain.     Marland Kitchen latanoprost (XALATAN) 0.005 % ophthalmic solution Place 1 drop into both eyes at bedtime.     Marland Kitchen leuprolide, 6 Month, (ELIGARD) 45 MG injection Inject 45 mg into the skin every 6 (six) months.    . levothyroxine  (SYNTHROID) 175 MCG tablet Take 175 mcg by mouth once.    Marland Kitchen levothyroxine (SYNTHROID) 200 MCG tablet     . lisinopril (ZESTRIL) 10 MG tablet Take 1 tablet (10 mg total) by mouth daily. 90 tablet 3  . Multiple Vitamin (MULTIVITAMIN) capsule Take 1 capsule by mouth daily.    Marland Kitchen MYRBETRIQ 50 MG TB24 tablet Take 50 mg by mouth daily.    . nitroGLYCERIN (NITROSTAT) 0.4 MG SL tablet PLACE 1 TABLET (0.4 MG TOTAL) UNDER THE TONGUE EVERY 5 (FIVE) MINUTES AS NEEDED FOR CHEST PAIN. 25 tablet 4  . pantoprazole (PROTONIX) 40 MG tablet Take 2 tablets (80 mg total) by mouth daily. 180 tablet 3  . rosuvastatin (CRESTOR) 40 MG tablet Take 1 tablet (40 mg total) by mouth daily. 90 tablet 3  . zinc gluconate 50 MG tablet Take 50 mg by mouth daily.     No current facility-administered medications on file prior to visit.    Allergies  Allergen Reactions  . Tagamet [Cimetidine] Other (See Comments)    Unknown - over 30 years ago (2021) - unsure what occurred it may have been hives or flushing  . Atorvastatin     Myalgias on 80mg  dose  . Niacin And Related Other (See Comments)    Flushing      Assessment/Plan:  1. Hypertension - Blood pressure is 154/96 today in clinic which is above his goal of <130/80  - Home BP readings have also been elevated lately as well (range 140-170/80-90s) - Home Bp cuff validated in clinic today - Discontinue ibuprofen PRN use - Discussed bedtime administration of BP medications - Initiate amlodipine 5mg  nightly - BMET checked today. Scr increased by ~30%. Continue lisinopril 10mg  but change to nightly dosing - F/u in 2 weeks with phone call to discuss home BP readings; can increase amlodipine accordingly if necessary - F/u in 4 week to repeat BMET and further titrate BP meds as needed  2. Hyperlipidemia - LDL is at goal of <70. TG slight elevated but patient admits to a diet high in sugar. He will limit his sweets and  carbs. No need for mediation adjustments at this  time.  Kennon Holter, PharmD PGY1 Ambulatory Care Pharmacy Resident

## 2019-11-08 ENCOUNTER — Other Ambulatory Visit: Payer: Self-pay

## 2019-11-08 ENCOUNTER — Ambulatory Visit (INDEPENDENT_AMBULATORY_CARE_PROVIDER_SITE_OTHER): Payer: Medicare PPO | Admitting: Pharmacist

## 2019-11-08 VITALS — BP 154/96 | HR 67

## 2019-11-08 DIAGNOSIS — I10 Essential (primary) hypertension: Secondary | ICD-10-CM | POA: Diagnosis not present

## 2019-11-08 LAB — BASIC METABOLIC PANEL
BUN/Creatinine Ratio: 19 (ref 10–24)
BUN: 22 mg/dL (ref 8–27)
CO2: 25 mmol/L (ref 20–29)
Calcium: 9.9 mg/dL (ref 8.6–10.2)
Chloride: 103 mmol/L (ref 96–106)
Creatinine, Ser: 1.13 mg/dL (ref 0.76–1.27)
GFR calc Af Amer: 77 mL/min/{1.73_m2} (ref >59)
GFR calc non Af Amer: 67 mL/min/{1.73_m2} (ref >59)
Glucose: 108 mg/dL — ABNORMAL HIGH (ref 65–99)
Potassium: 4.1 mmol/L (ref 3.5–5.2)
Sodium: 142 mmol/L (ref 134–144)

## 2019-11-08 MED ORDER — AMLODIPINE BESYLATE 5 MG PO TABS
5.0000 mg | ORAL_TABLET | Freq: Every day | ORAL | 3 refills | Status: DC
Start: 2019-11-08 — End: 2019-11-26

## 2019-11-08 NOTE — Patient Instructions (Signed)
It was a pleasure seeing you today!  MEDICATIONS: -We are changing your medications today -Start amlodipine 5mg  at bedtime -We will plan to increase lisinopril to 20mg  at bedtime as long as your lab work looks good. We will call you tomorrow to discuss your results and the plan at that time -Call if you have questions about your medications.  Call the clinic at [phone] with questions or to reschedule future appointments.

## 2019-11-14 ENCOUNTER — Other Ambulatory Visit: Payer: Self-pay

## 2019-11-14 DIAGNOSIS — E7800 Pure hypercholesterolemia, unspecified: Secondary | ICD-10-CM

## 2019-11-14 MED ORDER — FENOFIBRATE 145 MG PO TABS: 145.0000 mg | ORAL_TABLET | Freq: Every day | ORAL | 3 refills | Status: DC

## 2019-11-19 ENCOUNTER — Ambulatory Visit (INDEPENDENT_AMBULATORY_CARE_PROVIDER_SITE_OTHER): Payer: Medicare PPO | Admitting: *Deleted

## 2019-11-19 DIAGNOSIS — I495 Sick sinus syndrome: Secondary | ICD-10-CM

## 2019-11-19 LAB — CUP PACEART REMOTE DEVICE CHECK
Date Time Interrogation Session: 20210621103038
Implantable Lead Implant Date: 20201221
Implantable Lead Implant Date: 20201221
Implantable Lead Location: 753859
Implantable Lead Location: 753860
Implantable Lead Model: 377169
Implantable Lead Model: 377171
Implantable Lead Serial Number: 7000011949
Implantable Lead Serial Number: 81083606
Implantable Pulse Generator Implant Date: 20201221
Pulse Gen Model: 407145
Pulse Gen Serial Number: 69703841

## 2019-11-20 NOTE — Progress Notes (Signed)
Remote pacemaker transmission.   

## 2019-11-26 ENCOUNTER — Telehealth: Payer: Self-pay | Admitting: Pharmacist

## 2019-11-26 MED ORDER — AMLODIPINE BESYLATE 10 MG PO TABS
10.0000 mg | ORAL_TABLET | Freq: Every evening | ORAL | 3 refills | Status: DC
Start: 2019-11-26 — End: 2019-12-11

## 2019-11-26 NOTE — Telephone Encounter (Signed)
Called pt to follow up with home BP readings since starting amlodipine. Tolerating well, no LEE, dizziness, or balance problems. Home BP readings remain elevated at 140-167/79-95.  Will increase amlodipine to 10mg  daily. Pt will continue to monitor and record BP readings at home and bring them to follow up visit on 7/13.

## 2019-12-11 ENCOUNTER — Telehealth: Payer: Medicare PPO

## 2019-12-11 ENCOUNTER — Other Ambulatory Visit: Payer: Self-pay

## 2019-12-11 ENCOUNTER — Ambulatory Visit (HOSPITAL_COMMUNITY): Payer: Medicare PPO | Attending: Cardiology

## 2019-12-11 ENCOUNTER — Ambulatory Visit (INDEPENDENT_AMBULATORY_CARE_PROVIDER_SITE_OTHER): Payer: Medicare PPO | Admitting: Pharmacist

## 2019-12-11 VITALS — BP 118/82 | HR 67 | Wt 265.0 lb

## 2019-12-11 DIAGNOSIS — I712 Thoracic aortic aneurysm, without rupture, unspecified: Secondary | ICD-10-CM

## 2019-12-11 DIAGNOSIS — E038 Other specified hypothyroidism: Secondary | ICD-10-CM | POA: Diagnosis not present

## 2019-12-11 DIAGNOSIS — I10 Essential (primary) hypertension: Secondary | ICD-10-CM | POA: Diagnosis not present

## 2019-12-11 LAB — BASIC METABOLIC PANEL
BUN/Creatinine Ratio: 19 (ref 10–24)
BUN: 20 mg/dL (ref 8–27)
CO2: 25 mmol/L (ref 20–29)
Calcium: 9.7 mg/dL (ref 8.6–10.2)
Chloride: 104 mmol/L (ref 96–106)
Creatinine, Ser: 1.07 mg/dL (ref 0.76–1.27)
GFR calc Af Amer: 83 mL/min/{1.73_m2} (ref >59)
GFR calc non Af Amer: 71 mL/min/{1.73_m2} (ref >59)
Glucose: 130 mg/dL — ABNORMAL HIGH (ref 65–99)
Potassium: 4.6 mmol/L (ref 3.5–5.2)
Sodium: 140 mmol/L (ref 134–144)

## 2019-12-11 LAB — T4, FREE: Free T4: 1.51 ng/dL (ref 0.82–1.77)

## 2019-12-11 LAB — TSH: TSH: 0.452 u[IU]/mL (ref 0.450–4.500)

## 2019-12-11 MED ORDER — LISINOPRIL 10 MG PO TABS: 10.0000 mg | ORAL_TABLET | Freq: Every day | ORAL | 3 refills | Status: DC

## 2019-12-11 MED ORDER — AMLODIPINE BESYLATE 10 MG PO TABS: 10.0000 mg | ORAL_TABLET | Freq: Every evening | ORAL | 3 refills | Status: DC

## 2019-12-11 NOTE — Progress Notes (Signed)
Patient ID: Blake Carter                 DOB: November 10, 1950                      MRN: [id number]     HPI: Blake Carter is a 69 y.o. male referred by Dr. Radford Carter to HTN clinic. PMH is significant for hx of CAD (DES to LCx 05/2014 with residual LAD disease with repeat cath 02/2017 with patent stents and otherwise mild nonobstructive disease), HTN, dyslipidemia, sinus bradycardia, dilated aortic root (45mm by echo 10/2018), chronic diastolic CHF, allergic rhinits, GERD, hypothyroidism, hx of prostate cancer, hx of hydronephrosis of right kidney (s/p ureteral stenting), depression, and insomnia. Lipid meds have previously been adjusted (atorvastatin changed to rosuvastatin, gemfibrozil changed to fenofibrate due to prior myalgias, did not want injectable therapy or fish oil due to potential correlation with prostate cancer). Most recently, he has been followed in PharmD clinic for BP management. Pt had correlated increase in BP with Wellbutrin use. His home BP cuff calibrated accurately at last visit. Most recently, amlodipine has been titrated to 10mg  daily.  Pt presents today in good spirits, echo done today as well. Reports feeling light headed and shaky more recently, states 2 BP meds and Wellbutrin are most recent med changes. BP readings have improved notably at home down to 115/70-80. Feels more dizzy when he stands up. Felt a little dizzy before most recent amlodipine dose change, dose increase did not worsen symptoms. 1 episode of lightheadness this past week when he stood at church for 4 hours. Thinks he could be drinking more water. Stopped using ibuprofen. Has not been out walking his dog lately due to the heat. Moved BP medication to PM before bed. Has not been taking his Lasix recently - only uses for swelling in hands. Did have [employer] yesterday so he's noticed some puffiness in his hands today. Does not weigh himself at home.  Current HTN medications: lisinopril 10mg  daily, amlodipine 10mg   daily, furosemide 20mg  prn  BP goal: <130/37mmHg  Family History: father (cancer (died from small lung cancer per pt report), heart disease ("multiple bypasses", "aorta replaced"), HLD); brother (CAD); "everyone on my dad's side had some kind of coronary disease / MI / heart failure"; grandmother (elevated TG)  Social History: never used tobacco, occasional alcohol use (1x or 2x per month)  Diet:  Breakfast: eggs with cheese and ham on an english muffin Lunch: salad or sandwich (sometimes skips lunch) Dinner: protein with vegetables with a starch  Beverages: drinks mostly water or propel (2x per day in 1 32oz water) Does not use added salt on foods (Uses Mrs Deliah Boston instead if needed) Only goes out to eat 1-2 times per week  Exercise: walks 30 minutes to 1 hr 5-6 times per week, not as much lately due to heat  Home BP readings: improved to 110s-120s/70s-80   Wt Readings from Last 3 Encounters:  08/20/19 260 lb 12.8 oz (118.3 kg)  05/21/19 255 lb (115.7 kg)  05/07/19 263 lb (119.3 kg)   BP Readings from Last 3 Encounters:  11/08/19 (!) 154/96  10/25/19 (!) 154/92  08/20/19 128/88   Pulse Readings from Last 3 Encounters:  11/08/19 67  10/25/19 66  08/20/19 66    Renal function: CrCl cannot be calculated (Patient's most recent lab result is older than the maximum 21 days allowed.).  Past Medical History:  Diagnosis Date  . Allergic rhinitis   .  Arthritis    "a little bit in some of my joints" (06/20/2014)  . Chronic diastolic CHF (congestive heart failure) ([place])   . Coronary artery disease    a. 05/2014 Cath/PCI: LM nl, LAD 40p, 60-70d (FFR 0.80->3.0x24 Synergy DES), D2 40, LCX 90d (2.25x12 Synergy DES), RCA 20-74m, EF 60-65%. b. Cath 02/2017 without acute change, elev LVEDP suggestive of dCHF.  Marland Kitchen Depression   . Dilated aortic root (HCC)    aortic root 36mm and ascending aorta 82mm by echo 10/2016  . ED (erectile dysfunction)   . Environmental allergies    dry cough  .  GERD (gastroesophageal reflux disease)   . History of hiatal hernia   . Hydronephrosis of right kidney    a. s/p ureteral stenting in the past.  . Hyperlipidemia    under control  . Hypertension   . Hypothyroidism   . OCD (obsessive compulsive disorder)   . Panic disorder   . PONV (postoperative nausea and vomiting)    after thyroid surgery no problems in 10 years   . Prostate cancer [facility])    prostate cancer- robotic prostatectomy - followed by radiation because of positive lymph node--and pt on lupron injections  . Sinus bradycardia    a. chronic, asymptomatic.  24 hour Holter 10/2016 showed Sinus bradycardia, sinus arrhythmia and normal sinus rhythm with average heart rate 50bpm and heart rate ranged from 32 to 100bpm.  . SUI (stress urinary incontinence), male    S/P ROBOTIC PROSTATECTOMY AND RADIATION TX    Current Outpatient Medications on File Prior to Visit  Medication Sig Dispense Refill  . acetaminophen (TYLENOL) 500 MG tablet Take 1,000 mg by mouth every 6 (six) hours as needed (pain).    Marland Kitchen amLODipine (NORVASC) 10 MG tablet Take 1 tablet (10 mg total) by mouth every evening. 90 tablet 3  . Ascorbic Acid (VITAMIN C) 500 MG CAPS Take 500 mg by mouth daily.    Marland Kitchen aspirin EC 81 MG tablet Take 81 mg by mouth daily.    Marland Kitchen buPROPion (WELLBUTRIN XL) 150 MG 24 hr tablet Take 1 tablet (150 mg total) by mouth daily. 90 tablet 1  . Cholecalciferol (VITAMIN D3) 2000 units TABS Take 5,000 Units by mouth daily.     . diazepam (VALIUM) 5 MG tablet Take 1 tablet (5 mg total) by mouth at bedtime. (Patient taking differently: Take 5 mg by mouth at bedtime as needed. ) 30 tablet 2  . fenofibrate (TRICOR) 145 MG tablet Take 1 tablet (145 mg total) by mouth daily. 30 tablet 3  . fexofenadine (ALLEGRA) 180 MG tablet Take 180 mg by mouth daily.    . fluvoxaMINE (LUVOX) 50 MG tablet Take 200 mg by mouth every morning.     . furosemide (LASIX) 20 MG tablet TAKE 1 TABLET BY MOUTH  DAILY AS NEEDED FOR  FLUID  OR EDEMA (Patient taking differently: Take 20 mg by mouth daily. ) 90 tablet 3  . latanoprost (XALATAN) 0.005 % ophthalmic solution Place 1 drop into both eyes at bedtime.     Marland Kitchen leuprolide, 6 Month, (ELIGARD) 45 MG injection Inject 45 mg into the skin every 6 (six) months.    . levothyroxine (SYNTHROID) 175 MCG tablet Take 175 mcg by mouth once.    Marland Kitchen lisinopril (ZESTRIL) 10 MG tablet Take 10 mg by mouth at bedtime.  90 tablet 3  . Multiple Vitamin (MULTIVITAMIN) capsule Take 1 capsule by mouth daily.    Marland Kitchen MYRBETRIQ 50 MG TB24 tablet  Take 50 mg by mouth daily.    . nitroGLYCERIN (NITROSTAT) 0.4 MG SL tablet PLACE 1 TABLET (0.4 MG TOTAL) UNDER THE TONGUE EVERY 5 (FIVE) MINUTES AS NEEDED FOR CHEST PAIN. 25 tablet 4  . pantoprazole (PROTONIX) 40 MG tablet Take 2 tablets (80 mg total) by mouth daily. 180 tablet 3  . rosuvastatin (CRESTOR) 40 MG tablet Take 1 tablet (40 mg total) by mouth daily. 90 tablet 3  . zinc gluconate 50 MG tablet Take 50 mg by mouth daily.     No current facility-administered medications on file prior to visit.    Allergies  Allergen Reactions  . Tagamet [Cimetidine] Other (See Comments)    Unknown - over 30 years ago (2021) - unsure what occurred it may have been hives or flushing  . Atorvastatin     Myalgias on 80mg  dose  . Niacin And Related Other (See Comments)    Flushing      Assessment/Plan:  1. Hypertension - BP has improved notably and is now at goal <130/64mmHg. Will continue lisinopril 10mg  daily and amlodipine 10mg  daily. Rechecking BMET today due to mild increase in SCr after ACEi restart. Pt will stay hydrated with more water since he has been experiencing some lightheadedness, hopefully this will improve the longer he is on BP meds. Advised pt to continue monitoring BP at home and call with any concerns. Will follow up in 2 months for BP check.  Also checking TSH and free T4 per patient request - will forward results to PCP Dr Rex Kras.  Blake Perrow E.  Labradford Carter, PharmD, BCACP, [place] [address], [place], [place] [address] Phone: [phone]; Fax: [phone] 12/11/2019 7:26 AM

## 2019-12-11 NOTE — Patient Instructions (Addendum)
It was nice to see you today  Your blood pressure is excellent and at goal < 130/3mmHg  Continue taking your current medications  Stay hydrated with water  Call Agam Tuohy, Pharmacist with any blood pressure concerns [phone]

## 2019-12-12 DIAGNOSIS — C61 Malignant neoplasm of prostate: Secondary | ICD-10-CM | POA: Diagnosis not present

## 2019-12-14 ENCOUNTER — Telehealth: Payer: Self-pay | Admitting: *Deleted

## 2019-12-14 DIAGNOSIS — I712 Thoracic aortic aneurysm, without rupture, unspecified: Secondary | ICD-10-CM

## 2019-12-14 NOTE — Telephone Encounter (Signed)
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  Compared to prior CTA a year ago the aneurysm is larger.  Please get a chest CTA to assess aortic aneurysm.

## 2019-12-14 NOTE — Telephone Encounter (Signed)
I checked with [facility] and CT of chest was not done. I spoke with patient and updated him.  He would like to proceed with chest CTA.  I told him our CT department would contact him with appointment information.

## 2019-12-14 NOTE — Telephone Encounter (Signed)
Patient notified. He reports he had CT done in April 2021 at Dr Lynne Logan office.  CT result scanned in chart is of abdomen and pelvis.  Patient thinks CT of chest may have been done at that time also.  I told him I would contact Dr Lynne Logan office to check on this and then follow up with him.

## 2019-12-19 DIAGNOSIS — C61 Malignant neoplasm of prostate: Secondary | ICD-10-CM | POA: Diagnosis not present

## 2019-12-24 DIAGNOSIS — E890 Postprocedural hypothyroidism: Secondary | ICD-10-CM

## 2019-12-26 ENCOUNTER — Other Ambulatory Visit: Payer: Self-pay

## 2019-12-26 ENCOUNTER — Encounter: Payer: Self-pay | Admitting: Internal Medicine

## 2019-12-26 ENCOUNTER — Other Ambulatory Visit: Payer: Self-pay | Admitting: Internal Medicine

## 2019-12-26 ENCOUNTER — Ambulatory Visit: Payer: Medicare PPO | Admitting: Internal Medicine

## 2019-12-26 VITALS — BP 120/80 | HR 68 | Ht 71.0 in | Wt 260.6 lb

## 2019-12-26 DIAGNOSIS — R053 Chronic cough: Secondary | ICD-10-CM

## 2019-12-26 DIAGNOSIS — R05 Cough: Secondary | ICD-10-CM | POA: Diagnosis not present

## 2019-12-26 DIAGNOSIS — R0602 Shortness of breath: Secondary | ICD-10-CM

## 2019-12-26 LAB — CBC WITH DIFFERENTIAL/PLATELET
Basophils Absolute: 0 10*3/uL (ref 0.0–0.1)
Basophils Relative: 0.6 % (ref 0.0–3.0)
Eosinophils Absolute: 0.3 10*3/uL (ref 0.0–0.7)
Eosinophils Relative: 4.3 % (ref 0.0–5.0)
HCT: 35.5 % — ABNORMAL LOW (ref 39.0–52.0)
Hemoglobin: 11.8 g/dL — ABNORMAL LOW (ref 13.0–17.0)
Lymphocytes Relative: 23.4 % (ref 12.0–46.0)
Lymphs Abs: 1.4 10*3/uL (ref 0.7–4.0)
MCHC: 33.3 g/dL (ref 30.0–36.0)
MCV: 86.6 fl (ref 78.0–100.0)
Monocytes Absolute: 0.6 10*3/uL (ref 0.1–1.0)
Monocytes Relative: 9.5 % (ref 3.0–12.0)
Neutro Abs: 3.8 10*3/uL (ref 1.4–7.7)
Neutrophils Relative %: 62.2 % (ref 43.0–77.0)
Platelets: 247 10*3/uL (ref 150.0–400.0)
RBC: 4.11 Mil/uL — ABNORMAL LOW (ref 4.22–5.81)
RDW: 14.8 % (ref 11.5–15.5)
WBC: 6.1 10*3/uL (ref 4.0–10.5)

## 2019-12-26 LAB — D-DIMER, QUANTITATIVE: D-Dimer, Quant: 0.43 mcg/mL FEU (ref <0.50)

## 2019-12-26 NOTE — Progress Notes (Signed)
OV 12/26/2019  Subjective:  Patient ID: Blake Carter, male , DOB: 1951/05/10 , age 69 y.o. , MRN: [id number] , ADDRESS: [address] [place] [address] PCP Hulan Fess, MD   12/26/2019 -   Chief Complaint  Patient presents with  . Consult    Pt is being referred by Dr. Fransico Him. Pt states that he does have problems with his breathing when he goes up steps. Pt also has some occ chest discomfort near his heart. 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     HPI Blake Carter 69 y.o. -he has been on lisinopril at low-dose for 5 mg for many years.  Earlier in 2021 he became hypothyroid and Synthroid was increased.  According to him after that his TSH dropped extremely low.  Now he is backing off on the Synthroid and has been referred to an endocrinologist.  Since early this year he has had insidious onset of shortness of breath worse with exertion such as climbing steps.  He notices he gets very tachycardic when he does steps.  Relieved by rest.  No associated wheezing.  Then in summer 2021 he did have a pacemaker placed but since then the shortness of breath has persisted without any change.  However he became more hypertensive and his lisinopril was increased and amlodipine was added.  Since then he has a very mild occasional chronic cough.  Clearly the shortness of breath is more dominant symptom.  He is known to have aortic dilatation he is having a CT angiogram chest for that tomorrow.  No known history of asthma.  No known smoking history.  No known COPD no known pulmonary fibrosis.  He is known to have prostate cancer and has had radiation for this.  Details of this were reviewed in the chart.  He is also known to have diastolic dysfunction.  He says over the pandemic he has lost 10 to 15 pounds of weight.  He does have mild snoring but denies other symptoms of sleep apnea.     Simple office walk 185 feet x  3 laps goal with forehead probe  12/26/2019   O2 used ra  Number laps completed 3  Comments about pace Mod pace  Resting Pulse Ox/HR 100% and 69/min  Final Pulse Ox/HR 99% and 85/min  Desaturated </= 88% no  Desaturated <= 3% points no  Got Tachycardic >/= 90/min no  Symptoms at end of test Super mild dyspnea maybe  Miscellaneous comments x    Results for MAC, DOWDELL (MRN [id number]) as of 12/26/2019 08:58  Ref. Range 05/07/2019 13:19  Hemoglobin Latest Ref Range: 13.0 - 17.7 g/dL 12.0 (L)    Results for Blake, Carter (MRN [id number]) as of 12/26/2019 08:58  Ref. Range 12/11/2019 10:03  Creatinine Latest Ref Range: 0.76 - 1.27 mg/dL 1.07   Results for Blake, Carter (MRN [id number]) as of 12/26/2019 08:58  Ref. Range 04/05/2017 12:19 12/13/2017 11:39 10/31/2018 12:47 03/28/2019 11:32 05/07/2019 13:19  Eosinophils Absolute Latest Ref Range: 0 - 0 K/uL   0.2     PFT Results Latest Ref Rng & Units 05/17/2017  FVC-Pre L 3.66  FVC-Predicted Pre % 76  FVC-Post L 3.75  FVC-Predicted Post % 78  Pre FEV1/FVC % % 88  Post FEV1/FCV % % 87  FEV1-Pre L 3.21  FEV1-Predicted Pre % 90  FEV1-Post L 3.28  DLCO UNC% % 83  DLCO COR %Predicted % 96  TLC L 7.27  TLC % Predicted % 100  RV % Predicted % 116     Nuclear Medicine stress test 2017  Study Highlights   Nuclear stress EF: 51%.  Blood pressure demonstrated a hypertensive response to exercise.  There was no ST segment deviation noted during stress.  No T wave inversion was noted during stress.  The study is normal.  This is a low risk study.  The left ventricular ejection fraction is mildly decreased (45-54%).   Cardiac Cath 2018    Mid LAD Synergy DES & dCx-LPL(OM2) Synergy DES from 07/2014, 0 %stenosed.  LV end diastolic pressure is moderately elevated.   Widely patent stents, with otherwise mild disease.  No culprit lesion to explain exertional chest pain. He just had an echocardiogram done with normal LV function.  LVEDP is mild to  moderately elevated.     ECHO July 2021   IMPRESSIONS    1. Left ventricular ejection fraction, by estimation, is 60 to 65%. The  left ventricle has normal function. The left ventricle has no regional  wall motion abnormalities. Left ventricular diastolic parameters are  consistent with Grade I diastolic  dysfunction (impaired relaxation).  2. Right ventricular systolic function is normal. The right ventricular  size is normal. There is normal pulmonary artery systolic pressure.  3. The mitral valve is normal in structure. No evidence of mitral valve  regurgitation. No evidence of mitral stenosis.  4. The aortic valve is normal in structure. Aortic valve regurgitation is  mild. No aortic stenosis is present.  5. Aortic dilatation noted. There is moderate dilatation of the ascending  aorta measuring 46 mm.  6. The inferior vena cava is normal in size with greater than 50%  respiratory variability, suggesting right atrial pressure of 3 mmHg.   Comparison(s): Prior ECHO 42 mm ascending aorta. Current 45 mm. Consider  CTA of aorta for comparison.    Results for Blake, Carter (MRN [id number]) as of 12/26/2019 08:58  Ref. Range 10/05/2012 21:20 03/29/2017 14:30  D-Dimer, Quant Latest Ref Range: 0.00 - 0.50 ug/mL-FEU 0.32 0.27    ROS - per HPI  has a past medical history of Allergic rhinitis, Arthritis, Chronic diastolic CHF (congestive heart failure) ([place]), Coronary artery disease, Depression, Dilated aortic root ([place]), ED (erectile dysfunction), Environmental allergies, GERD (gastroesophageal reflux disease), History of hiatal hernia, Hydronephrosis of right kidney, Hyperlipidemia, Hypertension, Hypothyroidism, OCD (obsessive compulsive disorder), Panic disorder, PONV (postoperative nausea and vomiting), Prostate cancer ([place]), Sinus bradycardia, and SUI (stress urinary incontinence), male.   reports that he has never smoked. He has never used smokeless tobacco.  Past  Surgical History:  Procedure Laterality Date  . COLONOSCOPY  07/2013   Dr. Oletta Lamas  . CORONARY ANGIOPLASTY WITH STENT PLACEMENT  06/20/2014   "2"  . CYSTOSCOPY  11/30/2011   Procedure: CYSTOSCOPY;  Surgeon: Reece Packer, MD;  Location: [facility];  Service: Urology;  Laterality: N/A;  Inplantation of Artificial Sphincter and Cystoscopy  . CYSTOSCOPY WITH RETROGRADE PYELOGRAM, URETEROSCOPY AND STENT PLACEMENT Right 02/11/2014   Procedure: CYSTOSCOPY WITH RETROGRADE PYELOGRAM, URETEROSCOPY ,URETERAL BIOPSY AND STENT PLACEMENT;  Surgeon: Raynelle Bring, MD;  Location: [facility];  Service: Urology;  Laterality: Right;  . CYSTOSCOPY WITH RETROGRADE PYELOGRAM, URETEROSCOPY AND STENT PLACEMENT Right 04/15/2014   Procedure: CYSTOSCOPY WITH RETROGRADE PYELOGRAM, URETEROSCOPY, BIOPSY AND STENT PLACEMENT;  Surgeon: Raynelle Bring, MD;  Location: [facility];  Service: Urology;  Laterality: Right;  . LEFT HEART CATH AND CORONARY ANGIOGRAPHY N/A 03/29/2017   Procedure: LEFT HEART CATH AND  CORONARY ANGIOGRAPHY;  Surgeon: Leonie Man, MD;  Location: [facility];  Service: Cardiovascular;  Laterality: N/A;  . LEFT HEART CATHETERIZATION WITH CORONARY ANGIOGRAM N/A 06/20/2014   Procedure: LEFT HEART CATHETERIZATION WITH CORONARY ANGIOGRAM;  Surgeon: Leonie Man, MD;  Location: [facility];  Service: Cardiovascular;  Laterality: N/A;  . PACEMAKER IMPLANT N/A 05/21/2019   Procedure: PACEMAKER IMPLANT;  Surgeon: Evans Lance, MD;  Location: [facility];  Service: Cardiovascular;  Laterality: N/A;  . PERCUTANEOUS CORONARY STENT INTERVENTION (PCI-S)  06/20/2014   Procedure: PERCUTANEOUS CORONARY STENT INTERVENTION (PCI-S);  Surgeon: Leonie Man, MD;  Location: [facility] CATH LAB;  Service: Cardiovascular;;  LAD and Circumflex  . REFRACTIVE SURGERY Bilateral 2004  . ROBOT ASSISTED LAPAROSCOPIC RADICAL PROSTATECTOMY  05/2010  . THYROIDECTOMY, PARTIAL  10/1974  . TONSILLECTOMY  1956   . TOTAL THYROIDECTOMY   10/2012   "nuked it"  . UPPER GI ENDOSCOPY     food impaction 2009 done by Dr Oletta Lamas  . URINARY SPHINCTER IMPLANT  11/30/2011   Procedure: ARTIFICIAL URINARY SPHINCTER;  Surgeon: Reece Packer, MD;  Location: [facility];  Service: Urology;  Laterality: N/A;    Allergies  Allergen Reactions  . Tagamet [Cimetidine] Other (See Comments)    Unknown - over 30 years ago (2021) - unsure what occurred it may have been hives or flushing  . Atorvastatin     Myalgias on 80mg  dose  . Niacin And Related Other (See Comments)    Flushing     Immunization History  Administered Date(s) Administered  . Influenza, High Dose Seasonal PF 03/13/2019  . Influenza,inj,Quad PF,6+ Mos 03/05/2013  . Influenza-Unspecified 02/28/2014  . PFIZER SARS-COV-2 Vaccination 06/22/2019, 07/13/2019  . Tdap 02/29/2016    Family History  Problem Relation Age of Onset  . Cancer Father   . Hyperlipidemia Father   . Heart disease Father        Father had CABG/aorta repair by the time he was in his 21s  . CAD Brother        Brother who is 10 years younger has had some sort of stenting     Current Outpatient Medications:  .  acetaminophen (TYLENOL) 500 MG tablet, Take 1,000 mg by mouth every 6 (six) hours as needed (pain)., Disp: , Rfl:  .  amLODipine (NORVASC) 10 MG tablet, Take 1 tablet (10 mg total) by mouth every evening., Disp: 90 tablet, Rfl: 3 .  Ascorbic Acid (VITAMIN C) 500 MG CAPS, Take 500 mg by mouth 3 (three) times a week. , Disp: , Rfl:  .  aspirin EC 81 MG tablet, Take 81 mg by mouth daily., Disp: , Rfl:  .  buPROPion (WELLBUTRIN XL) 150 MG 24 hr tablet, Take 1 tablet (150 mg total) by mouth daily., Disp: 90 tablet, Rfl: 1 .  Cholecalciferol (VITAMIN D3) 2000 units TABS, Take 6,000 Units by mouth 3 (three) times a week. , Disp: , Rfl:  .  diazepam (VALIUM) 5 MG tablet, Take 1 tablet (5 mg total) by mouth at bedtime. (Patient taking differently: Take 5 mg by mouth at bedtime as needed. ), Disp: 30  tablet, Rfl: 2 .  fenofibrate (TRICOR) 145 MG tablet, Take 1 tablet (145 mg total) by mouth daily., Disp: 30 tablet, Rfl: 3 .  fexofenadine (ALLEGRA) 180 MG tablet, Take 180 mg by mouth daily., Disp: , Rfl:  .  fluvoxaMINE (LUVOX) 50 MG tablet, Take 200 mg by mouth every morning. , Disp: ,  Rfl:  .  furosemide (LASIX) 20 MG tablet, TAKE 1 TABLET BY MOUTH  DAILY AS NEEDED FOR FLUID  OR EDEMA (Patient taking differently: Take 20 mg by mouth daily. ), Disp: 90 tablet, Rfl: 3 .  latanoprost (XALATAN) 0.005 % ophthalmic solution, Place 1 drop into both eyes at bedtime. , Disp: , Rfl:  .  leuprolide, 6 Month, (ELIGARD) 45 MG injection, Inject 45 mg into the skin every 6 (six) months., Disp: , Rfl:  .  levothyroxine (SYNTHROID) 175 MCG tablet, Take 175 mcg by mouth once., Disp: , Rfl:  .  lisinopril (ZESTRIL) 10 MG tablet, Take 1 tablet (10 mg total) by mouth at bedtime., Disp: 90 tablet, Rfl: 3 .  Multiple Vitamin (MULTIVITAMIN) capsule, Take 1 capsule by mouth daily., Disp: , Rfl:  .  MYRBETRIQ 50 MG TB24 tablet, Take 50 mg by mouth daily., Disp: , Rfl:  .  nitroGLYCERIN (NITROSTAT) 0.4 MG SL tablet, PLACE 1 TABLET (0.4 MG TOTAL) UNDER THE TONGUE EVERY 5 (FIVE) MINUTES AS NEEDED FOR CHEST PAIN., Disp: 25 tablet, Rfl: 4 .  pantoprazole (PROTONIX) 40 MG tablet, Take 2 tablets (80 mg total) by mouth daily., Disp: 180 tablet, Rfl: 3 .  rosuvastatin (CRESTOR) 40 MG tablet, Take 40 mg by mouth daily., Disp: , Rfl:  .  zinc gluconate 50 MG tablet, Take 50 mg by mouth 3 (three) times a week. , Disp: , Rfl:  .  rosuvastatin (CRESTOR) 40 MG tablet, Take 1 tablet (40 mg total) by mouth daily., Disp: 90 tablet, Rfl: 3      Objective:   Vitals:   12/26/19 0852  BP: 120/80  Pulse: 68  SpO2: 95%  Weight: (!) 118.2 kg  Height: 5\' 11"  (1.803 m)    Estimated body mass index is 36.35 kg/m as calculated from the following:   Height as of this encounter: 5\' 11"  (1.803 m).   Weight as of this encounter: 118.2  kg.  @WEIGHTCHANGE @  [employer]   12/26/19 0852  Weight: (!) 118.2 kg     Physical Exam  General Appearance:    Alert, cooperative, no distress, appears stated age - yes , Deconditioned looking - no , OBESE  - yes, Sitting on Wheelchair -  no  Head:    Normocephalic, without obvious abnormality, atraumatic  Eyes:    PERRL, conjunctiva/corneas clear,  Ears:    Normal TM's and external ear canals, both ears  Nose:   Nares normal, septum midline, mucosa normal, no drainage    or sinus tenderness. OXYGEN ON  - no . Patient is @ ra   Throat:   Lips, mucosa, and tongue normal; teeth and gums normal. Cyanosis on lips - no  Neck:   Supple, symmetrical, trachea midline, no adenopathy;    thyroid:  no enlargement/tenderness/nodules; no carotid   bruit or JVD  Back:     Symmetric, no curvature, ROM normal, no CVA tenderness  Lungs:     Distress - no , Wheeze no, Barrell Chest - no, Purse lip breathing - no, Crackles - no   Chest Wall:    No tenderness or deformity.    Heart:    Regular rate and rhythm, S1 and S2 normal, no rub   or gallop, Murmur - no  Breast Exam:    NOT DONE  Abdomen:     Soft, non-tender, bowel sounds active all four quadrants,    no masses, no organomegaly. Visceral obesity - yes  Genitalia:   NOT DONE  Rectal:   NOT DONE  Extremities:   Extremities - normal, Has Cane - no, Clubbing - no, Edema - no  Pulses:   2+ and symmetric all extremities  Skin:   Stigmata of Connective Tissue Disease - no  Lymph nodes:   Cervical, supraclavicular, and axillary nodes normal  Psychiatric:  Neurologic:   Pleasant - yes, Anxious - no, Flat affect - no  CAm-ICU - neg, Alert and Oriented x 3 - yes, Moves all 4s - yes, Speech - normal, Cognition - intact           Assessment:       ICD-10-CM   1. SOB (shortness of breath)  R06.02 CBC w/Diff    IgE    D-Dimer, Quantitative  2. Chronic cough  R05   3. Shortness of breath  R06.02 CT Chest High Resolution       Plan:      Patient Instructions     ICD-10-CM   1. SOB (shortness of breath)  R06.02   2. Chronic cough  R05    Shortness of breath likely related to weight and then also stiff heart muscle called diastolic dysfunction.  Thyroid issues could be weighing in amongst this   Chronic cough could be due to increasing lisinopril in the summer 2021  However conditions such as pulmonary fibrosis or asthma need to be ruled out and blood clot  Plan -Do blood CBC with differential, blood IgE and D-dimer test today -Had high-resolution CT chest supine and prone tomorrow 12/27/2019 [this will be done in addition to CT angiogram chest for aortic dilatation] -Agree with keeping up endocrine referral  Follow-up -Face-to-face a telephone visit with nurse practitioner in the next few to several weeks  -If these results are noncontributory then we will consider referral to pulmonary stress test  -If cough is bothersome at some point we might have to consider coming off lisinopril  -Might also have to evaluate for risk for sleep apnea with stop bang questionnaire or ESS questionnaire in the future     SIGNATURE    Dr. Brand Males, M.D., F.C.C.P,  Pulmonary and Critical Care Medicine Staff Physician, [facility] Director - Interstitial Lung Disease  Program  Pulmonary [place] at [place], [place], [address]  Pager: [phone], If no answer or between  15:00h - 7:00h: call [phone] Telephone: [phone]  9:29 AM 12/26/2019

## 2019-12-26 NOTE — Patient Instructions (Addendum)
ICD-10-CM   1. SOB (shortness of breath)  R06.02   2. Chronic cough  R05    Shortness of breath likely related to weight and then also stiff heart muscle called diastolic dysfunction.  Thyroid issues could be weighing in amongst this   Chronic cough could be due to increasing lisinopril in the summer 2021  However conditions such as pulmonary fibrosis or asthma need to be ruled out and blood clot  Plan -Do blood CBC with differential, blood IgE and D-dimer test today -Had high-resolution CT chest supine and prone tomorrow 12/27/2019 [this will be done in addition to CT angiogram chest for aortic dilatation] -Agree with keeping up endocrine referral  Follow-up -Face-to-face a telephone visit with nurse practitioner in the next few to several weeks  -If these results are noncontributory then we will consider referral to pulmonary stress test  -If cough is bothersome at some point we might have to consider coming off lisinopril  -Might also have to evaluate for risk for sleep apnea with stop bang questionnaire or ESS questionnaire in the future

## 2019-12-27 ENCOUNTER — Telehealth: Payer: Self-pay

## 2019-12-27 ENCOUNTER — Ambulatory Visit (INDEPENDENT_AMBULATORY_CARE_PROVIDER_SITE_OTHER)
Admission: RE | Admit: 2019-12-27 | Discharge: 2019-12-27 | Disposition: A | Discharge status: Home / Self Care | Payer: Medicare PPO | Source: Ambulatory Visit | Attending: Cardiology | Admitting: Cardiology

## 2019-12-27 ENCOUNTER — Ambulatory Visit (INDEPENDENT_AMBULATORY_CARE_PROVIDER_SITE_OTHER)
Admission: RE | Admit: 2019-12-27 | Discharge: 2019-12-27 | Disposition: A | Discharge status: Home / Self Care | Payer: Medicare PPO | Source: Ambulatory Visit | Attending: Internal Medicine | Admitting: Internal Medicine

## 2019-12-27 DIAGNOSIS — R0602 Shortness of breath: Secondary | ICD-10-CM | POA: Diagnosis not present

## 2019-12-27 DIAGNOSIS — I712 Thoracic aortic aneurysm, without rupture, unspecified: Secondary | ICD-10-CM

## 2019-12-27 DIAGNOSIS — J479 Bronchiectasis, uncomplicated: Secondary | ICD-10-CM | POA: Diagnosis not present

## 2019-12-27 MED ORDER — IOHEXOL 350 MG/ML SOLN
100.0000 mL | Freq: Once | INTRAVENOUS | Status: AC | PRN
  Administered 2019-12-27: 100 mL via INTRAVENOUS

## 2019-12-27 NOTE — Telephone Encounter (Signed)
-----   Message from Sueanne Margarita, MD sent at 12/27/2019 10:23 AM EDT ----- Chest CT showed mild increase in dimension of ascending aorta from 42 to 38mm.  Please repeat Chest CTA in 6 months to make sure this remains stable.  Have him check his Bp daily for a week and call with results. LDL is at goal

## 2019-12-27 NOTE — Telephone Encounter (Signed)
The patient has been notified of the result and verbalized understanding.  All questions (if any) were answered. Antonieta Iba, RN 12/27/2019 11:49 AM  Orders have been placed for chest CT. Patient will take BP daily and send message with readings in one week.

## 2019-12-31 LAB — D-DIMER, QUANTITATIVE

## 2019-12-31 LAB — IGE: IgE (Immunoglobulin E), Serum: 42 kU/L (ref <=114)

## 2020-01-03 ENCOUNTER — Telehealth: Payer: Self-pay | Admitting: Internal Medicine

## 2020-01-03 NOTE — Telephone Encounter (Signed)
He is seeing Aaron Edelman 9/1/121 but in in interim  - blood work - normal d-dimer (so doubt blood clot),, normal IgE. Has persistent mild anemia on CBC  - Re CT - some abnormal findings that Aaron Edelman will discuss but based on this cough is coming from lisinopril, Rt lung base mild bronchiectasis and +.- small hiatal hernia    IMPRESSION: 1. Bland appearing, bandlike scarring of the right lung base and lingula. Mild, tubular bronchiectasis. Findings are most consistent with sequelae of prior infection or aspiration. No evidence of fibrotic interstitial lung disease. 2. Unchanged enlargement of the tubular ascending thoracic aorta, measuring approximately 4.3 x 4.2 cm, better assessed by simultaneous CT chest angiogram. 3. Coronary artery disease. 4. Small hiatal hernia. 5. Cholelithiasis. 6. Hepatic steatosis.   Electronically Signed   By: Eddie Candle M.D.   On: 12/27/2019 15:34

## 2020-01-04 ENCOUNTER — Other Ambulatory Visit: Payer: Self-pay | Admitting: Pharmacist

## 2020-01-04 DIAGNOSIS — I10 Essential (primary) hypertension: Secondary | ICD-10-CM

## 2020-01-04 MED ORDER — LISINOPRIL 20 MG PO TABS: 20.0000 mg | ORAL_TABLET | Freq: Every day | ORAL | 11 refills | Status: DC

## 2020-01-08 NOTE — Telephone Encounter (Signed)
Called and spoke with pt letting him know the info stated by MR and stated that Aaron Edelman would further discuss all test results in detail 9/1. Pt verbalized understanding. Nothing further needed.

## 2020-01-10 ENCOUNTER — Ambulatory Visit (INDEPENDENT_AMBULATORY_CARE_PROVIDER_SITE_OTHER): Payer: Medicare PPO | Admitting: Adult Health

## 2020-01-10 ENCOUNTER — Encounter: Payer: Self-pay | Admitting: Adult Health

## 2020-01-10 ENCOUNTER — Other Ambulatory Visit: Payer: Self-pay

## 2020-01-10 DIAGNOSIS — F411 Generalized anxiety disorder: Secondary | ICD-10-CM | POA: Diagnosis not present

## 2020-01-10 DIAGNOSIS — F331 Major depressive disorder, recurrent, moderate: Secondary | ICD-10-CM | POA: Diagnosis not present

## 2020-01-10 DIAGNOSIS — F429 Obsessive-compulsive disorder, unspecified: Secondary | ICD-10-CM | POA: Diagnosis not present

## 2020-01-10 NOTE — Progress Notes (Signed)
Blake Carter [id number] [birth year] 69 y.o.  Subjective:   Patient ID:  Blake Carter is a 69 y.o. (DOB December 08, 1950) male.  Chief Complaint: No chief complaint on file.   HPI Blake Carter presents to the office today for follow-up of MDD, GAD, OCD  Describes mood today as "ok". Pleasant. Mood symptoms - denies depression, anxiety, and irritability. Stating "I feel like I'm doing alright for now". Does not feel like his body is tolerating the Wellbutrin. Has been taking every 3 days, but would like to leave it off. Does not feel like he needs anything "right now". Followed by Urologist. Has completed radiation treatments - Prostate 1.09. Decreased worry and rumination - "most of the time I don't". Stable interest and motivation. Taking medications as prescribed.  Energy levels decreased. Active, does not have a regular exercise routine.  Enjoys some usual interests and activities. Married. Lives with wife of  46 years. Has 2 children - 1 son in [place] and 1 daughter in [place]. Spending time with family. Appetite adequate. Weight gain - 260 pounds. Sleeping difficulties sometimes - difficulties getting to sleep. Averages 6 to 9 hours. Napping during the day some days. Focus and concentration stable. Completing tasks. Managing aspects of household. Retired Forensic psychologist and professor.  Denies SI or HI. Denies AH or VH.    PHQ2-9     CARDIAC REHAB PHASE II EXERCISE from 10/02/2014 in [place] from 07/08/2014 in [facility]  PHQ-2 Total Score 0 0       Review of Systems:  Review of Systems  Musculoskeletal: Negative for gait problem.  Neurological: Negative for tremors.  Psychiatric/Behavioral:       Please refer to HPI    Medications: I have reviewed the patient's current medications.  Current Outpatient Medications  Medication Sig Dispense Refill  . acetaminophen (TYLENOL) 500 MG  tablet Take 1,000 mg by mouth every 6 (six) hours as needed (pain).    Marland Kitchen amLODipine (NORVASC) 10 MG tablet Take 1 tablet (10 mg total) by mouth every evening. 90 tablet 3  . Ascorbic Acid (VITAMIN C) 500 MG CAPS Take 500 mg by mouth 3 (three) times a week.     Marland Kitchen aspirin EC 81 MG tablet Take 81 mg by mouth daily.    . Cholecalciferol (VITAMIN D3) 2000 units TABS Take 6,000 Units by mouth 3 (three) times a week.     . diazepam (VALIUM) 5 MG tablet Take 1 tablet (5 mg total) by mouth at bedtime. (Patient taking differently: Take 5 mg by mouth at bedtime as needed. ) 30 tablet 2  . fenofibrate (TRICOR) 145 MG tablet Take 1 tablet (145 mg total) by mouth daily. 30 tablet 3  . fexofenadine (ALLEGRA) 180 MG tablet Take 180 mg by mouth daily.    . fluvoxaMINE (LUVOX) 50 MG tablet Take 200 mg by mouth every morning.     . furosemide (LASIX) 20 MG tablet TAKE 1 TABLET BY MOUTH  DAILY AS NEEDED FOR FLUID  OR EDEMA (Patient taking differently: Take 20 mg by mouth daily. ) 90 tablet 3  . latanoprost (XALATAN) 0.005 % ophthalmic solution Place 1 drop into both eyes at bedtime.     Marland Kitchen leuprolide, 6 Month, (ELIGARD) 45 MG injection Inject 45 mg into the skin every 6 (six) months.    . levothyroxine (SYNTHROID) 175 MCG tablet Take 175 mcg by mouth once.    Marland Kitchen lisinopril (ZESTRIL) 20  MG tablet Take 1 tablet (20 mg total) by mouth at bedtime. 30 tablet 11  . Multiple Vitamin (MULTIVITAMIN) capsule Take 1 capsule by mouth daily.    Marland Kitchen MYRBETRIQ 50 MG TB24 tablet Take 50 mg by mouth daily.    . nitroGLYCERIN (NITROSTAT) 0.4 MG SL tablet PLACE 1 TABLET (0.4 MG TOTAL) UNDER THE TONGUE EVERY 5 (FIVE) MINUTES AS NEEDED FOR CHEST PAIN. 25 tablet 4  . pantoprazole (PROTONIX) 40 MG tablet Take 2 tablets (80 mg total) by mouth daily. 180 tablet 3  . rosuvastatin (CRESTOR) 40 MG tablet Take 1 tablet (40 mg total) by mouth daily. 90 tablet 3  . rosuvastatin (CRESTOR) 40 MG tablet Take 40 mg by mouth daily.    Marland Kitchen zinc gluconate 50  MG tablet Take 50 mg by mouth 3 (three) times a week.      No current facility-administered medications for this visit.    Medication Side Effects: None  Allergies:  Allergies  Allergen Reactions  . Tagamet [Cimetidine] Other (See Comments)    Unknown - over 30 years ago (2021) - unsure what occurred it may have been hives or flushing  . Atorvastatin     Myalgias on 80mg  dose  . Niacin And Related Other (See Comments)    Flushing     Past Medical History:  Diagnosis Date  . Allergic rhinitis   . Arthritis    "a little bit in some of my joints" (06/20/2014)  . Chronic diastolic CHF (congestive heart failure) ([place])   . Coronary artery disease    a. 05/2014 Cath/PCI: LM nl, LAD 40p, 60-70d (FFR 0.80->3.0x24 Synergy DES), D2 40, LCX 90d (2.25x12 Synergy DES), RCA 20-16m, EF 60-65%. b. Cath 02/2017 without acute change, elev LVEDP suggestive of dCHF.  Marland Kitchen Depression   . Dilated aortic root (HCC)    aortic root 12mm and ascending aorta 4mm by echo 10/2016  . ED (erectile dysfunction)   . Environmental allergies    dry cough  . GERD (gastroesophageal reflux disease)   . History of hiatal hernia   . Hydronephrosis of right kidney    a. s/p ureteral stenting in the past.  . Hyperlipidemia    under control  . Hypertension   . Hypothyroidism   . OCD (obsessive compulsive disorder)   . Panic disorder   . PONV (postoperative nausea and vomiting)    after thyroid surgery no problems in 10 years   . Prostate cancer [facility])    prostate cancer- robotic prostatectomy - followed by radiation because of positive lymph node--and pt on lupron injections  . Sinus bradycardia    a. chronic, asymptomatic.  24 hour Holter 10/2016 showed Sinus bradycardia, sinus arrhythmia and normal sinus rhythm with average heart rate 50bpm and heart rate ranged from 32 to 100bpm.  . SUI (stress urinary incontinence), male    S/P ROBOTIC PROSTATECTOMY AND RADIATION TX    Family History  Problem Relation Age of  Onset  . Cancer Father   . Hyperlipidemia Father   . Heart disease Father        Father had CABG/aorta repair by the time he was in his 24s  . CAD Brother        Brother who is 45 years younger has had some sort of stenting    Social History   Socioeconomic History  . Marital status: Married    Spouse name: Not on file  . Number of children: Not on file  . Years of education:  Not on file  . Highest education level: Not on file  Occupational History  . Not on file  Tobacco Use  . Smoking status: Never Smoker  . Smokeless tobacco: Never Used  Vaping Use  . Vaping Use: Never used  Substance and Sexual Activity  . Alcohol use: Yes    Alcohol/week: 0.0 standard drinks    Comment: 06/20/2014 "1-2 drinks a couple times/month"  . Drug use: No  . Sexual activity: Yes  Other Topics Concern  . Not on file  Social History Narrative  . Not on file   Social Determinants of Health   Financial Resource Strain:   . Difficulty of Paying Living Expenses:   Food Insecurity:   . Worried About Charity fundraiser in the Last Year:   . Arboriculturist in the Last Year:   Transportation Needs:   . Film/video editor (Medical):   Marland Kitchen Lack of Transportation (Non-Medical):   Physical Activity:   . Days of Exercise per Week:   . Minutes of Exercise per Session:   Stress:   . Feeling of Stress :   Social Connections:   . Frequency of Communication with Friends and Family:   . Frequency of Social Gatherings with Friends and Family:   . Attends Religious Services:   . Active Member of Clubs or Organizations:   . Attends Archivist Meetings:   Marland Kitchen Marital Status:   Intimate Partner Violence:   . Fear of Current or Ex-Partner:   . Emotionally Abused:   Marland Kitchen Physically Abused:   . Sexually Abused:     Past Medical History, Surgical history, Social history, and Family history were reviewed and updated as appropriate.   Please see review of systems for further details on the  patient's review from today.   Objective:   Physical Exam:  There were no vitals taken for this visit.  Physical Exam Constitutional:      General: He is not in acute distress. Musculoskeletal:        General: No deformity.  Neurological:     Mental Status: He is alert and oriented to person, place, and time.     Coordination: Coordination normal.  Psychiatric:        Attention and Perception: Attention and perception normal. He does not perceive auditory or visual hallucinations.        Mood and Affect: Mood normal. Mood is not anxious or depressed. Affect is not labile, blunt, angry or inappropriate.        Speech: Speech normal.        Behavior: Behavior normal.        Thought Content: Thought content normal. Thought content is not paranoid or delusional. Thought content does not include homicidal or suicidal ideation. Thought content does not include homicidal or suicidal plan.        Cognition and Memory: Cognition and memory normal.        Judgment: Judgment normal.     Comments: Insight intact     Lab Review:     Component Value Date/Time   NA 140 12/11/2019 1003   K 4.6 12/11/2019 1003   CL 104 12/11/2019 1003   CO2 25 12/11/2019 1003   GLUCOSE 130 (H) 12/11/2019 1003   GLUCOSE 98 10/31/2018 1247   BUN 20 12/11/2019 1003   CREATININE 1.07 12/11/2019 1003   CREATININE 0.92 10/31/2018 1247   CREATININE 1.07 10/14/2015 0850   CALCIUM 9.7 12/11/2019 1003   PROT  6.9 10/22/2019 0807   ALBUMIN 4.5 10/22/2019 0807   AST 23 10/22/2019 0807   AST 19 10/31/2018 1247   ALT 20 10/22/2019 0807   ALT 25 10/31/2018 1247   ALKPHOS 69 10/22/2019 0807   BILITOT 0.2 10/22/2019 0807   BILITOT 0.3 10/31/2018 1247   GFRNONAA 71 12/11/2019 1003   GFRNONAA >60 10/31/2018 1247   GFRAA 83 12/11/2019 1003   GFRAA >60 10/31/2018 1247       Component Value Date/Time   WBC 6.1 12/26/2019 0931   RBC 4.11 (L) 12/26/2019 0931   HGB 11.8 (L) 12/26/2019 0931   HGB 12.0 (L) 05/07/2019  1319   HCT 35.5 (L) 12/26/2019 0931   HCT 35.7 (L) 05/07/2019 1319   PLT 247.0 12/26/2019 0931   PLT 245 05/07/2019 1319   MCV 86.6 12/26/2019 0931   MCV 88 05/07/2019 1319   MCH 29.5 05/07/2019 1319   MCH 29.1 10/31/2018 1247   MCHC 33.3 12/26/2019 0931   RDW 14.8 12/26/2019 0931   RDW 13.9 05/07/2019 1319   LYMPHSABS 1.4 12/26/2019 0931   LYMPHSABS 1.7 05/07/2019 1319   MONOABS 0.6 12/26/2019 0931   EOSABS 0.3 12/26/2019 0931   EOSABS 0.2 05/07/2019 1319   BASOSABS 0.0 12/26/2019 0931   BASOSABS 0.0 05/07/2019 1319    No results found for: POCLITH, LITHIUM   No results found for: PHENYTOIN, PHENOBARB, VALPROATE, CBMZ   .res Assessment: Plan:     Plan:  PDMP reviewed  1. Luvox 200mg  daily 2. D/C Wellbutrin XL 150mg  daily  3. Add Valium 5mg  at hs - for sleepless nights  Consider therapy  RTC 2 months  Patient advised to contact office with any questions, adverse effects, or acute worsening in signs and symptoms.  Discussed potential benefits, risk, and side effects of benzodiazepines to include potential risk of tolerance and dependence, as well as possible drowsiness.  Advised patient not to drive if experiencing drowsiness and to take lowest possible effective dose to minimize risk of dependence and tolerance.  Diagnoses and all orders for this visit:  Obsessive-compulsive disorder, unspecified type  Generalized anxiety disorder  Major depressive disorder, recurrent episode, moderate ([place])     Please see After Visit Summary for patient specific instructions.  Future Appointments  Date Time Provider [place]  01/30/2020  8:20 AM Sueanne Margarita, MD [facility]  01/30/2020 11:00 AM Lauraine Rinne, NP [facility] None  01/30/2020 11:30 AM [facility] LAB [facility]  02/05/2020  9:00 AM [facility] PHARMACIST [facility]  02/18/2020  7:05 AM [facility] DEVICE REMOTES [facility]  05/19/2020  7:05 AM  [facility] DEVICE REMOTES [facility]  08/18/2020  7:05 AM [facility] DEVICE REMOTES [facility]  11/17/2020  7:05 AM [facility] DEVICE REMOTES [facility]  02/16/2021  7:05 AM [facility] DEVICE REMOTES [facility]    No orders of the defined types were placed in this encounter.   -------------------------------

## 2020-01-29 NOTE — Progress Notes (Signed)
Cardiology Office Note:    Date:  01/30/2020   ID:  Blake Carter, DOB August 26, 1950, MRN [id number]  PCP:  Hulan Fess, MD  Cardiologist:  Fransico Him, MD    Referring MD: Hulan Fess, MD   Chief Complaint  Patient presents with  . Coronary Artery Disease  . Hypertension  . Hyperlipidemia    History of Present Illness:    Blake Carter is a 69 y.o. male with a hx of  CAD (DES to LCx 05/2014 with residual LAD disease with repeat cath 02/2017 with patent stents and otherwise mild nonobstructive dz), HTN, dyslipidemia, sinus bradycardia, dilated aortic root (4.4cm by Chest CTA July 2021) and chronic diastolic CHF.    He is here today for followup and is doing well.  He has recently noticed that his heart has been racing.  He will be feeling fine and then all of a sudden his heart will take off racing.  He has clocked his HR at 180bpm at times.  Most of the time he notices that it is worse with activity.  He has had some problems with chest pain and SOB when he gets palpitations.  It does not occur at any other time when he exerts himself and only occurs when his heart is racing. He denies any  PND, orthopnea, LE edema, dizziness, or syncope.  He is compliant with his meds and is tolerating meds with no SE.  Recent 2D echo in July showed EF 60-65% with G1DD and normal RVF  Past Medical History:  Diagnosis Date  . Allergic rhinitis   . Arthritis    "a little bit in some of my joints" (06/20/2014)  . Chronic diastolic CHF (congestive heart failure) ([place])   . Coronary artery disease    a. 05/2014 Cath/PCI: LM nl, LAD 40p, 60-70d (FFR 0.80->3.0x24 Synergy DES), D2 40, LCX 90d (2.25x12 Synergy DES), RCA 20-74m, EF 60-65%. b. Cath 02/2017 without acute change, elev LVEDP suggestive of dCHF.  Marland Kitchen Depression   . Dilated aortic root (HCC)    aortic root at 31mm by Chest CTA 11/2019  . ED (erectile dysfunction)   . Environmental allergies    dry cough  . GERD (gastroesophageal reflux  disease)   . History of hiatal hernia   . Hydronephrosis of right kidney    a. s/p ureteral stenting in the past.  . Hyperlipidemia    under control  . Hypertension   . Hypothyroidism   . OCD (obsessive compulsive disorder)   . Panic disorder   . PONV (postoperative nausea and vomiting)    after thyroid surgery no problems in 10 years   . Prostate cancer [facility])    prostate cancer- robotic prostatectomy - followed by radiation because of positive lymph node--and pt on lupron injections  . Sinus bradycardia    a. chronic, asymptomatic.  24 hour Holter 10/2016 showed Sinus bradycardia, sinus arrhythmia and normal sinus rhythm with average heart rate 50bpm and heart rate ranged from 32 to 100bpm.  . SUI (stress urinary incontinence), male    S/P ROBOTIC PROSTATECTOMY AND RADIATION TX    Past Surgical History:  Procedure Laterality Date  . COLONOSCOPY  07/2013   Dr. Oletta Lamas  . CORONARY ANGIOPLASTY WITH STENT PLACEMENT  06/20/2014   "2"  . CYSTOSCOPY  11/30/2011   Procedure: CYSTOSCOPY;  Surgeon: Reece Packer, MD;  Location: [facility];  Service: Urology;  Laterality: N/A;  Inplantation of Artificial Sphincter and Cystoscopy  . CYSTOSCOPY WITH RETROGRADE PYELOGRAM, URETEROSCOPY AND  STENT PLACEMENT Right 02/11/2014   Procedure: CYSTOSCOPY WITH RETROGRADE PYELOGRAM, URETEROSCOPY ,URETERAL BIOPSY AND STENT PLACEMENT;  Surgeon: Raynelle Bring, MD;  Location: [facility];  Service: Urology;  Laterality: Right;  . CYSTOSCOPY WITH RETROGRADE PYELOGRAM, URETEROSCOPY AND STENT PLACEMENT Right 04/15/2014   Procedure: CYSTOSCOPY WITH RETROGRADE PYELOGRAM, URETEROSCOPY, BIOPSY AND STENT PLACEMENT;  Surgeon: Raynelle Bring, MD;  Location: [facility];  Service: Urology;  Laterality: Right;  . LEFT HEART CATH AND CORONARY ANGIOGRAPHY N/A 03/29/2017   Procedure: LEFT HEART CATH AND CORONARY ANGIOGRAPHY;  Surgeon: Leonie Man, MD;  Location: [place] [facility];  Service: Cardiovascular;  Laterality: N/A;  . LEFT  HEART CATHETERIZATION WITH CORONARY ANGIOGRAM N/A 06/20/2014   Procedure: LEFT HEART CATHETERIZATION WITH CORONARY ANGIOGRAM;  Surgeon: Leonie Man, MD;  Location: [facility] CATH LAB;  Service: Cardiovascular;  Laterality: N/A;  . PACEMAKER IMPLANT N/A 05/21/2019   Procedure: PACEMAKER IMPLANT;  Surgeon: Evans Lance, MD;  Location: [place] [facility];  Service: Cardiovascular;  Laterality: N/A;  . PERCUTANEOUS CORONARY STENT INTERVENTION (PCI-S)  06/20/2014   Procedure: PERCUTANEOUS CORONARY STENT INTERVENTION (PCI-S);  Surgeon: Leonie Man, MD;  Location: [facility] CATH LAB;  Service: Cardiovascular;;  LAD and Circumflex  . REFRACTIVE SURGERY Bilateral 2004  . ROBOT ASSISTED LAPAROSCOPIC RADICAL PROSTATECTOMY  05/2010  . THYROIDECTOMY, PARTIAL  10/1974  . TONSILLECTOMY  1956   . TOTAL THYROIDECTOMY  10/2012   "nuked it"  . UPPER GI ENDOSCOPY     food impaction 2009 done by Dr Oletta Lamas  . URINARY SPHINCTER IMPLANT  11/30/2011   Procedure: ARTIFICIAL URINARY SPHINCTER;  Surgeon: Reece Packer, MD;  Location: [facility];  Service: Urology;  Laterality: N/A;    Current Medications: Current Meds  Medication Sig  . acetaminophen (TYLENOL) 500 MG tablet Take 1,000 mg by mouth every 6 (six) hours as needed (pain).  Marland Kitchen amLODipine (NORVASC) 10 MG tablet Take 1 tablet (10 mg total) by mouth every evening.  . Ascorbic Acid (VITAMIN C) 500 MG CAPS Take 500 mg by mouth daily.   Marland Kitchen aspirin EC 81 MG tablet Take 81 mg by mouth daily.  . Cholecalciferol (VITAMIN D3) 2000 units TABS Take 6,000 Units by mouth 3 (three) times a week.   . diazepam (VALIUM) 5 MG tablet Take 1 tablet (5 mg total) by mouth at bedtime. (Patient taking differently: Take 5 mg by mouth at bedtime as needed. )  . fenofibrate (TRICOR) 145 MG tablet Take 1 tablet (145 mg total) by mouth daily.  . fexofenadine (ALLEGRA) 180 MG tablet Take 180 mg by mouth daily.  . fluvoxaMINE (LUVOX) 50 MG tablet Take 200 mg by mouth every morning.   .  furosemide (LASIX) 20 MG tablet TAKE 1 TABLET BY MOUTH  DAILY AS NEEDED FOR FLUID  OR EDEMA (Patient taking differently: Take 20 mg by mouth daily. )  . latanoprost (XALATAN) 0.005 % ophthalmic solution Place 1 drop into both eyes at bedtime.   Marland Kitchen leuprolide, 6 Month, (ELIGARD) 45 MG injection Inject 45 mg into the skin every 6 (six) months.  . levothyroxine (SYNTHROID) 175 MCG tablet Take 175 mcg by mouth once.  Marland Kitchen lisinopril (ZESTRIL) 20 MG tablet Take 1 tablet (20 mg total) by mouth at bedtime.  . Multiple Vitamin (MULTIVITAMIN) capsule Take 1 capsule by mouth daily.  Marland Kitchen MYRBETRIQ 50 MG TB24 tablet Take 50 mg by mouth daily.  . nitroGLYCERIN (NITROSTAT) 0.4 MG SL tablet PLACE 1 TABLET (0.4 MG TOTAL) UNDER THE TONGUE EVERY  5 (FIVE) MINUTES AS NEEDED FOR CHEST PAIN.  Marland Kitchen pantoprazole (PROTONIX) 40 MG tablet Take 2 tablets (80 mg total) by mouth daily.  . rosuvastatin (CRESTOR) 40 MG tablet Take 1 tablet (40 mg total) by mouth daily.  Marland Kitchen zinc gluconate 50 MG tablet Take 50 mg by mouth 3 (three) times a week.      Allergies:   Tagamet [cimetidine], Atorvastatin, and Niacin and related   Social History   Socioeconomic History  . Marital status: Married    Spouse name: Not on file  . Number of children: Not on file  . Years of education: Not on file  . Highest education level: Not on file  Occupational History  . Not on file  Tobacco Use  . Smoking status: Never Smoker  . Smokeless tobacco: Never Used  Vaping Use  . Vaping Use: Never used  Substance and Sexual Activity  . Alcohol use: Yes    Alcohol/week: 0.0 standard drinks    Comment: 06/20/2014 "1-2 drinks a couple times/month"  . Drug use: No  . Sexual activity: Yes  Other Topics Concern  . Not on file  Social History Narrative  . Not on file   Social Determinants of Health   Financial Resource Strain:   . Difficulty of Paying Living Expenses: Not on file  Food Insecurity:   . Worried About Charity fundraiser in the Last  Year: Not on file  . Ran Out of Food in the Last Year: Not on file  Transportation Needs:   . Lack of Transportation (Medical): Not on file  . Lack of Transportation (Non-Medical): Not on file  Physical Activity:   . Days of Exercise per Week: Not on file  . Minutes of Exercise per Session: Not on file  Stress:   . Feeling of Stress : Not on file  Social Connections:   . Frequency of Communication with Friends and Family: Not on file  . Frequency of Social Gatherings with Friends and Family: Not on file  . Attends Religious Services: Not on file  . Active Member of Clubs or Organizations: Not on file  . Attends Archivist Meetings: Not on file  . Marital Status: Not on file     Family History: The patient's family history includes CAD in his brother; Cancer in his father; Heart disease in his father; Hyperlipidemia in his father.  ROS:   Please see the history of present illness.    ROS  All other systems reviewed and negative.   EKGs/Labs/Other Studies Reviewed:    The following studies were reviewed today: none  EKG:  EKG is ordered today and showed   Recent Labs: 10/22/2019: ALT 20 12/11/2019: BUN 20; Creatinine, Ser 1.07; Potassium 4.6; Sodium 140; TSH 0.452 12/26/2019: Hemoglobin 11.8; Platelets 247.0   Recent Lipid Panel    Component Value Date/Time   CHOL 120 10/22/2019 0807   TRIG 169 (H) 10/22/2019 0807   HDL 45 10/22/2019 0807   CHOLHDL 2.7 10/22/2019 0807   CHOLHDL 3.8 03/29/2017 0554   VLDL 34 03/29/2017 0554   LDLCALC 47 10/22/2019 0807    Physical Exam:    VS:  BP 112/82   Pulse 80   Ht 5\' 11"  (1.803 m)   Wt 269 lb (122 kg)   SpO2 95%   BMI 37.52 kg/m     Wt Readings from Last 3 Encounters:  01/30/20 269 lb (122 kg)  12/26/19 (!) 260 lb 9.6 oz (118.2 kg)  12/11/19  265 lb (120.2 kg)     GEN: Well nourished, well developed in no acute distress HEENT: Normal NECK: No JVD; No carotid bruits LYMPHATICS: No  lymphadenopathy CARDIAC:RRR, no murmurs, rubs, gallops RESPIRATORY:  Clear to auscultation without rales, wheezing or rhonchi  ABDOMEN: Soft, non-tender, non-distended MUSCULOSKELETAL:  No edema; No deformity  SKIN: Warm and dry NEUROLOGIC:  Alert and oriented x 3 PSYCHIATRIC:  Normal affect    ASSESSMENT:    1. Coronary artery disease due to lipid rich plaque   2. Essential hypertension   3. Dilated aortic root ([place])   4. Pure hypercholesterolemia   5. Chronic diastolic heart failure (HCC)   6. Palpitations    PLAN:    In order of problems listed above:  1.  ASCAD  - S/P DES to LCx 05/2014 with residual LAD disease with repeat cath 02/2017 with patent stents and otherwise mild nonobstructive dz.   -he denies any anginal symptoms -continue ASA and statin  2.  HTN  -BP is controlled on exam today -continue Lisinopril 20mg  daily and amlodipine 10mg  daily  3.  Dilated aortic root  -4.4cm by Chest CTA 11/2019 -repeat study in November -BP controlled -continue ASA and statin   4.   Hyperlipidemia  -LDL goal < 70.  -continue Crestor 40mg  daily and Tricor 160mg  daily -LDL was 47 in May 2021  5.  Chronic diastolic CHF  -he appears euvolemic on exam today -weight is stable -continue PRN Lasix   6.  Palpitations -this occurs mainly when he exerts himself but sometimes at rest -he says that it feels like what he had prior to RAI therapy for his thyroid -check TSH and T4 -I will get a 2 week Ziopatch  Medication Adjustments/Labs and Tests Ordered: Current medicines are reviewed at length with the patient today.  Concerns regarding medicines are outlined above.  No orders of the defined types were placed in this encounter.  No orders of the defined types were placed in this encounter.   Signed, Fransico Him, MD  01/30/2020 8:39 AM    [place]

## 2020-01-30 ENCOUNTER — Ambulatory Visit: Payer: Medicare PPO | Admitting: Cardiology

## 2020-01-30 ENCOUNTER — Other Ambulatory Visit: Payer: Medicare PPO | Admitting: *Deleted

## 2020-01-30 ENCOUNTER — Other Ambulatory Visit: Payer: Self-pay

## 2020-01-30 ENCOUNTER — Ambulatory Visit: Payer: Medicare PPO | Admitting: Pulmonary Disease

## 2020-01-30 ENCOUNTER — Telehealth: Payer: Self-pay | Admitting: Radiology

## 2020-01-30 ENCOUNTER — Encounter: Payer: Self-pay | Admitting: Pulmonary Disease

## 2020-01-30 ENCOUNTER — Encounter: Payer: Self-pay | Admitting: Cardiology

## 2020-01-30 VITALS — BP 118/78 | HR 68 | Temp 97.0°F | Ht 71.0 in | Wt 269.8 lb

## 2020-01-30 VITALS — BP 112/82 | HR 80 | Ht 71.0 in | Wt 269.0 lb

## 2020-01-30 DIAGNOSIS — E7800 Pure hypercholesterolemia, unspecified: Secondary | ICD-10-CM | POA: Diagnosis not present

## 2020-01-30 DIAGNOSIS — R0602 Shortness of breath: Secondary | ICD-10-CM | POA: Diagnosis not present

## 2020-01-30 DIAGNOSIS — I10 Essential (primary) hypertension: Secondary | ICD-10-CM

## 2020-01-30 DIAGNOSIS — R002 Palpitations: Secondary | ICD-10-CM

## 2020-01-30 DIAGNOSIS — I2510 Atherosclerotic heart disease of native coronary artery without angina pectoris: Secondary | ICD-10-CM | POA: Diagnosis not present

## 2020-01-30 DIAGNOSIS — K219 Gastro-esophageal reflux disease without esophagitis: Secondary | ICD-10-CM

## 2020-01-30 DIAGNOSIS — I2583 Coronary atherosclerosis due to lipid rich plaque: Secondary | ICD-10-CM | POA: Diagnosis not present

## 2020-01-30 DIAGNOSIS — I77810 Thoracic aortic ectasia: Secondary | ICD-10-CM

## 2020-01-30 DIAGNOSIS — I5032 Chronic diastolic (congestive) heart failure: Secondary | ICD-10-CM

## 2020-01-30 DIAGNOSIS — R918 Other nonspecific abnormal finding of lung field: Secondary | ICD-10-CM | POA: Diagnosis not present

## 2020-01-30 LAB — BASIC METABOLIC PANEL
BUN/Creatinine Ratio: 15 (ref 10–24)
BUN: 17 mg/dL (ref 8–27)
CO2: 24 mmol/L (ref 20–29)
Calcium: 9.8 mg/dL (ref 8.6–10.2)
Chloride: 103 mmol/L (ref 96–106)
Creatinine, Ser: 1.15 mg/dL (ref 0.76–1.27)
GFR calc Af Amer: 76 mL/min/{1.73_m2} (ref >59)
GFR calc non Af Amer: 65 mL/min/{1.73_m2} (ref >59)
Glucose: 99 mg/dL (ref 65–99)
Potassium: 4.7 mmol/L (ref 3.5–5.2)
Sodium: 143 mmol/L (ref 134–144)

## 2020-01-30 LAB — T4, FREE: Free T4: 1.34 ng/dL (ref 0.82–1.77)

## 2020-01-30 LAB — TSH: TSH: 6.2 u[IU]/mL — ABNORMAL HIGH (ref 0.450–4.500)

## 2020-01-30 NOTE — Assessment & Plan Note (Signed)
Patient has no active concerns of aspiration or uncontrolled acid reflux Reviewed with patient that CT findings did show history of scarring due to suspected aspiration Reviewed with patient that he should contact us if he starts to notice that the symptoms are worsening  Plan: Continue Protonix Continue GERD lifestyle recommendations

## 2020-01-30 NOTE — Patient Instructions (Addendum)
You were seen today by Lauraine Rinne, NP  for:   1. SOB (shortness of breath) 2. Palpitations  Agree with current work-up with cardiology  Agree with lab work and heart monitor today  Keep upcoming follow-up with endocrinology and cardiology  3. Gastroesophageal reflux disease, unspecified whether esophagitis present  Continue current recommendations  If you start having worsened acid reflux please let us know  You start having issues with swallowing again please let our office know  4. Abnormal findings on diagnostic imaging of lung  Bronchiectasis: This is the medical term which indicates that you have damage, dilated airways making you more susceptible to respiratory infection. Let us know if you have cough with change in mucus color or fevers or chills.  At that point you would need an antibiotic. Maintain a healthy nutritious diet, eating whole foods Take your medications as prescribed     Follow Up:    Return in about 6 months (around 07/29/2020), or if symptoms worsen or fail to improve, for Follow up with Dr. Purnell Shoemaker.   Please do your part to reduce the spread of COVID-19:      Reduce your risk of any infection  and COVID19 by using the similar precautions used for avoiding the common cold or flu:  Marland Kitchen Wash your hands often with soap and warm water for at least 20 seconds.  If soap and water are not readily available, use an alcohol-based hand sanitizer with at least 60% alcohol.  . If coughing or sneezing, cover your mouth and nose by coughing or sneezing into the elbow areas of your shirt or coat, into a tissue or into your sleeve (not your hands). Langley Gauss A MASK when in public  . Avoid shaking hands with others and consider head nods or verbal greetings only. . Avoid touching your eyes, nose, or mouth with unwashed hands.  . Avoid close contact with people who are sick. . Avoid places or events with large numbers of people in one location, like concerts or  sporting events. . If you have some symptoms but not all symptoms, continue to monitor at home and seek medical attention if your symptoms worsen. . If you are having a medical emergency, call 911.   [place] / e-Visit: [email or web address]         [facility]: [facility]: [phone]                   [facility]: [phone]     It is flu season:   >>> Best ways to protect herself from the flu: Receive the yearly flu vaccine, practice good hand hygiene washing with soap and also using hand sanitizer when available, eat a nutritious meals, get adequate rest, hydrate appropriately   Please contact the office if your symptoms worsen or you have concerns that you are not improving.   Thank you for choosing [facility] for your healthcare, and for allowing [place] to partner with you on your healthcare journey. I am thankful to be able to provide care to you today.   Wyn Quaker FNP-C

## 2020-01-30 NOTE — Telephone Encounter (Signed)
Enrolled patient for a 14 day Zio AT monitor to be mailed to patients home.  

## 2020-01-30 NOTE — Patient Instructions (Signed)
Medication Instructions:  Your physician recommends that you continue on your current medications as directed. Please refer to the Current Medication list given to you today.  *If you need a refill on your cardiac medications before your next appointment, please call your pharmacy*   Lab Work: TODAY: T4, TSH, BMET If you have labs (blood work) drawn today and your tests are completely normal, you will receive your results only by: Marland Kitchen MyChart Message (if you have MyChart) OR . A paper copy in the mail If you have any lab test that is abnormal or we need to change your treatment, we will call you to review the results.   Follow-Up: At [facility], you and your health needs are our priority.  As part of our continuing mission to provide you with exceptional heart care, we have created designated Provider Care Teams.  These Care Teams include your primary Cardiologist (physician) and Advanced Practice Providers (APPs -  Physician Assistants and Nurse Practitioners) who all work together to provide you with the care you need, when you need it.   Your next appointment:   1 year(s)  The format for your next appointment:   In Person  Provider:   You may see Fransico Him, MD or one of the following Advanced Practice Providers on your designated Care Team:    Melina Copa, PA-C  Ermalinda Barrios, PA-C    Other Instructions ZIO AT Long term monitor-Live Telemetry  Your physician has requested you wear a ZIO patch monitor for 14 days.  This is a single patch monitor. [employer] supplies one patch monitor per enrollment. Additional stickers are not available.  Please do not apply patch if you will be having a Nuclear Stress Test, Echocardiogram, Cardiac CT, MRI, or Chest Xray during the time frame you would be wearing the monitor. The patch cannot be worn during these tests. You cannot remove and re-apply the ZIO AT patch monitor.   Your ZIO patch monitor will be sent Fed Ex from [employer] directly to your home address. The monitor may also be mailed to a PO BOX if home delivery is not available. It may take 3-5 days to receive your monitor after you have been enrolled.  Once you have received you monitor, please review enclosed instructions. Your monitor has already been registered assigning a specific monitor serial # to you.   Applying the monitor  Shave hair from upper left chest.  Hold abrader disc by orange tab. Rub abrader in 40 strokes over left upper chest as indicated in your monitor instructions.  Clean area with 4 enclosed alcohol pads. Use all pads to ensure the area is cleaned thoroughly. Let dry.  Apply patch as indicated in monitor instructions. Patch will be placed under collarbone on left side of chest with arrow pointing upward.  Rub patch adhesive wings for 2 minutes. Remove the white label marked "1". Remove the white label marked "2". Rub patch adhesive wings for 2 additional minutes.  While looking in a mirror, press and release button in center of patch. A small green light will flash 3-4 times. This will be your only indicator the monitor has been turned on.  Do not shower for the first 24 hours. You may shower after the first 24 hours.  Press the button if you feel a symptom. You will hear a small click. Record Date, Time and Symptom in the Patient Log.   Starting the Gateway  In your kit there is a Hydrographic surveyor  box the size of a cellphone. This is Airline pilot. It transmits all your recorded data to [facility]. This box must stay within 10 feet of you at all times. Open the box and push the * button. There will be a light that blinks orange and then green a few times. When the light stops blinking, the Gateway is connected to the ZIO patch.  Call [employer] at [phone] to confirm your monitor is transmitting.   Returning your monitor  Remove your patch and place it inside the [place]. In the lower half of the Gateway there is a white bag with  prepaid postage on it. Place Gateway in bag and seal. Mail package back to [place] as soon as possible. Your physician should have your final report approximately 7 days after you have mailed back your monitor.   Call [place] at [phone] if you have questions regarding your ZIO AT patch monitor. Call them immediately if you see an orange light blinking on your monitor.  If your monitor falls off in less than 4 days contact our Monitor department at [phone]. If your monitor becomes loose or falls off after 4 days call [employer] at [phone] for suggestions on securing your monitor.

## 2020-01-30 NOTE — Assessment & Plan Note (Signed)
Reviewed high-resolution CT chest findings with patient today Patient with history of concern for aspiration as well as recurrent pneumonia.  This is since resolved.  Patient feels that this is stable at this time Patient also reports better control of acid reflux  Patient is aware that there are findings of bronchiectasis.  Reviewed with patient that if he does need antibiotics in the future he may need a longer course.  Plan: 5-month follow-up with our office Continue to clinically monitor

## 2020-01-30 NOTE — Assessment & Plan Note (Signed)
Agree that likely patient is having symptoms of abnormal thyroid  Plan: Proceed forward with cardiovascular work-up with heart monitor as well as lab work today  If symptoms persist despite adequate treatment of thyroid as well as cardiovascular concerns may need to proceed forward with pulmonary function testing.  We will delay this at this time given ongoing cardiovascular and thyroid work-up.  81-month follow-up with our office

## 2020-01-30 NOTE — Progress Notes (Signed)
@Patient  ID: Blake Carter, male    DOB: 1951-01-14, 69 y.o.   MRN: [id number]  Chief Complaint  Patient presents with  . Follow-up    pt had ct done here to go over results    Referring provider: Hulan Fess, MD  HPI:  69 year old male never smoker initially referred to our office in July/2021 for evaluation of shortness of breath  PMH: Graves' disease, GERD, depression, hypertension, heart palpitations, prostate cancer Smoker/ Smoking History: Never smoker Maintenance: None Pt of: Dr. Chase Caller  01/30/2020  - Visit   69 year old male never smoker initially referred to our office in July/2021 for evaluation of shortness of breath.  Patient was evaluated by Dr. Chase Caller.  Lab work was performed as well as a high-resolution CT chest.  Those results are listed below:  12/26/2019-D-dimer-0.43 12/26/2019-CBC with differential-eosinophils relative 4.3, eosinophils absolute 0.3 12/26/2019-IgE-42  12/27/2019-CT chest high-res-bland appearing bandlike scarring in the right lung base and lingula mild tubular bronchiectasis, findings are most consistent with sequela of prior infection or aspiration, no evidence of fibrotic interstitial lung disease, unchanged enlargement of tubular ascending thoracic aorta measuring approximately 4.3 x 4.2 cm, coronary artery disease  12/11/2019-echocardiogram-LV ejection fraction 60 to 62%, grade 1 diastolic dysfunction, RV size normal, right ventricular systolic function is normal  Patient reporting to our office today that he has had follow-up with Dr. Radford Pax with cardiology.  They are planning on doing a heart monitor as well as lab work today.  There are concerns the patient may have abnormal thyroid levels and this may be driving his heart palpitations which then in turn is contributing to his shortness of breath.   Questionaires / Pulmonary Flowsheets:   ACT:  No flowsheet data found.  MMRC: No flowsheet data found.  Epworth:  No flowsheet  data found.  Tests:   12/26/2019-D-dimer-0.43 12/26/2019-CBC with differential-eosinophils relative 4.3, eosinophils absolute 0.3 12/26/2019-IgE-42  12/27/2019-CT chest high-res-bland appearing bandlike scarring in the right lung base and lingula mild tubular bronchiectasis, findings are most consistent with sequela of prior infection or aspiration, no evidence of fibrotic interstitial lung disease, unchanged enlargement of tubular ascending thoracic aorta measuring approximately 4.3 x 4.2 cm, coronary artery disease  12/11/2019-echocardiogram-LV ejection fraction 60 to 22%, grade 1 diastolic dysfunction, RV size normal, right ventricular systolic function is normal  FENO:  No results found for: NITRICOXIDE  PFT: PFT Results Latest Ref Rng & Units 05/17/2017  FVC-Pre L 3.66  FVC-Predicted Pre % 76  FVC-Post L 3.75  FVC-Predicted Post % 78  Pre FEV1/FVC % % 88  Post FEV1/FCV % % 87  FEV1-Pre L 3.21  FEV1-Predicted Pre % 90  FEV1-Post L 3.28  DLCO uncorrected ml/min/mmHg 28.06  DLCO UNC% % 83  DLCO corrected ml/min/mmHg 29.00  DLCO COR %Predicted % 86  DLVA Predicted % 96  TLC L 7.27  TLC % Predicted % 100  RV % Predicted % 116    WALK:  SIX MIN WALK 12/26/2019  Supplimental Oxygen during Test? (L/min) No  Tech Comments: Pt walked at a moderate pace completing all required laps having very mild SOB.    Imaging: No results found.  Lab Results:  CBC    Component Value Date/Time   WBC 6.1 12/26/2019 0931   RBC 4.11 (L) 12/26/2019 0931   HGB 11.8 (L) 12/26/2019 0931   HGB 12.0 (L) 05/07/2019 1319   HCT 35.5 (L) 12/26/2019 0931   HCT 35.7 (L) 05/07/2019 1319   PLT 247.0 12/26/2019 0931   PLT 245  05/07/2019 1319   MCV 86.6 12/26/2019 0931   MCV 88 05/07/2019 1319   MCH 29.5 05/07/2019 1319   MCH 29.1 10/31/2018 1247   MCHC 33.3 12/26/2019 0931   RDW 14.8 12/26/2019 0931   RDW 13.9 05/07/2019 1319   LYMPHSABS 1.4 12/26/2019 0931   LYMPHSABS 1.7 05/07/2019 1319    MONOABS 0.6 12/26/2019 0931   EOSABS 0.3 12/26/2019 0931   EOSABS 0.2 05/07/2019 1319   BASOSABS 0.0 12/26/2019 0931   BASOSABS 0.0 05/07/2019 1319    BMET    Component Value Date/Time   NA 140 12/11/2019 1003   K 4.6 12/11/2019 1003   CL 104 12/11/2019 1003   CO2 25 12/11/2019 1003   GLUCOSE 130 (H) 12/11/2019 1003   GLUCOSE 98 10/31/2018 1247   BUN 20 12/11/2019 1003   CREATININE 1.07 12/11/2019 1003   CREATININE 0.92 10/31/2018 1247   CREATININE 1.07 10/14/2015 0850   CALCIUM 9.7 12/11/2019 1003   GFRNONAA 71 12/11/2019 1003   GFRNONAA >60 10/31/2018 1247   GFRAA 83 12/11/2019 1003   GFRAA >60 10/31/2018 1247    BNP No results found for: BNP  ProBNP    Component Value Date/Time   PROBNP 64 12/13/2017 1139   PROBNP 99.9 10/05/2012 2150    Specialty Problems      Pulmonary Problems   Allergic rhinitis, cause unspecified   SOB (shortness of breath)   Globus pharyngeus      Allergies  Allergen Reactions  . Tagamet [Cimetidine] Other (See Comments)    Unknown - over 30 years ago (2021) - unsure what occurred it may have been hives or flushing  . Atorvastatin     Myalgias on 80mg  dose  . Niacin And Related Other (See Comments)    Flushing     Immunization History  Administered Date(s) Administered  . Influenza, High Dose Seasonal PF 03/13/2019  . Influenza,inj,Quad PF,6+ Mos 03/05/2013  . Influenza-Unspecified 02/28/2014  . PFIZER SARS-COV-2 Vaccination 06/22/2019, 07/13/2019, 01/13/2020  . Tdap 02/29/2016    Past Medical History:  Diagnosis Date  . Allergic rhinitis   . Arthritis    "a little bit in some of my joints" (06/20/2014)  . Chronic diastolic CHF (congestive heart failure) ([place])   . Coronary artery disease    a. 05/2014 Cath/PCI: LM nl, LAD 40p, 60-70d (FFR 0.80->3.0x24 Synergy DES), D2 40, LCX 90d (2.25x12 Synergy DES), RCA 20-68m, EF 60-65%. b. Cath 02/2017 without acute change, elev LVEDP suggestive of dCHF.  Marland Kitchen Depression   . Dilated  aortic root (HCC)    aortic root at 29mm by Chest CTA 11/2019  . ED (erectile dysfunction)   . Environmental allergies    dry cough  . GERD (gastroesophageal reflux disease)   . History of hiatal hernia   . Hydronephrosis of right kidney    a. s/p ureteral stenting in the past.  . Hyperlipidemia    under control  . Hypertension   . Hypothyroidism   . OCD (obsessive compulsive disorder)   . Panic disorder   . PONV (postoperative nausea and vomiting)    after thyroid surgery no problems in 10 years   . Prostate cancer [facility])    prostate cancer- robotic prostatectomy - followed by radiation because of positive lymph node--and pt on lupron injections  . Sinus bradycardia    a. chronic, asymptomatic.  24 hour Holter 10/2016 showed Sinus bradycardia, sinus arrhythmia and normal sinus rhythm with average heart rate 50bpm and heart rate ranged from 32  to 100bpm.  . SUI (stress urinary incontinence), male    S/P ROBOTIC PROSTATECTOMY AND RADIATION TX    Tobacco History: Social History   Tobacco Use  Smoking Status Never Smoker  Smokeless Tobacco Never Used   Counseling given: Yes   Continue to not smoke  Outpatient Encounter Medications as of 01/30/2020  Medication Sig  . acetaminophen (TYLENOL) 500 MG tablet Take 1,000 mg by mouth every 6 (six) hours as needed (pain).  Marland Kitchen amLODipine (NORVASC) 10 MG tablet Take 1 tablet (10 mg total) by mouth every evening.  . Ascorbic Acid (VITAMIN C) 500 MG CAPS Take 500 mg by mouth daily.   Marland Kitchen aspirin EC 81 MG tablet Take 81 mg by mouth daily.  . Cholecalciferol (VITAMIN D3) 2000 units TABS Take 6,000 Units by mouth 3 (three) times a week.   . diazepam (VALIUM) 5 MG tablet Take 1 tablet (5 mg total) by mouth at bedtime. (Patient taking differently: Take 5 mg by mouth at bedtime as needed. )  . fenofibrate (TRICOR) 145 MG tablet Take 1 tablet (145 mg total) by mouth daily.  . fexofenadine (ALLEGRA) 180 MG tablet Take 180 mg by mouth daily.  .  fluvoxaMINE (LUVOX) 50 MG tablet Take 200 mg by mouth every morning.   . furosemide (LASIX) 20 MG tablet TAKE 1 TABLET BY MOUTH  DAILY AS NEEDED FOR FLUID  OR EDEMA (Patient taking differently: Take 20 mg by mouth daily. )  . latanoprost (XALATAN) 0.005 % ophthalmic solution Place 1 drop into both eyes at bedtime.   Marland Kitchen leuprolide, 6 Month, (ELIGARD) 45 MG injection Inject 45 mg into the skin every 6 (six) months.  . levothyroxine (SYNTHROID) 175 MCG tablet Take 175 mcg by mouth once.  Marland Kitchen lisinopril (ZESTRIL) 20 MG tablet Take 1 tablet (20 mg total) by mouth at bedtime.  . Multiple Vitamin (MULTIVITAMIN) capsule Take 1 capsule by mouth daily.  Marland Kitchen MYRBETRIQ 50 MG TB24 tablet Take 50 mg by mouth daily.  . nitroGLYCERIN (NITROSTAT) 0.4 MG SL tablet PLACE 1 TABLET (0.4 MG TOTAL) UNDER THE TONGUE EVERY 5 (FIVE) MINUTES AS NEEDED FOR CHEST PAIN.  Marland Kitchen pantoprazole (PROTONIX) 40 MG tablet Take 2 tablets (80 mg total) by mouth daily.  . rosuvastatin (CRESTOR) 40 MG tablet Take 1 tablet (40 mg total) by mouth daily.  Marland Kitchen zinc gluconate 50 MG tablet Take 50 mg by mouth 3 (three) times a week.    No facility-administered encounter medications on file as of 01/30/2020.     Review of Systems  Review of Systems  Constitutional: Negative for activity change, chills, fatigue, fever and unexpected weight change.  HENT: Negative for postnasal drip, rhinorrhea, sinus pressure, sinus pain and sore throat.   Eyes: Negative.   Respiratory: Positive for shortness of breath. Negative for cough and wheezing.   Cardiovascular: Positive for palpitations. Negative for chest pain.  Gastrointestinal: Negative for constipation, diarrhea, nausea and vomiting.  Endocrine: Negative.   Genitourinary: Negative.   Musculoskeletal: Negative.   Skin: Negative.   Neurological: Negative for dizziness and headaches.  Psychiatric/Behavioral: Negative.  Negative for dysphoric mood. The patient is not nervous/anxious.   All other systems  reviewed and are negative.    Physical Exam  BP 118/78 (BP Location: Left Arm, Cuff Size: Normal)   Pulse 68   Temp (!) 97 F (36.1 C) (Oral)   Ht 5\' 11"  (1.803 m)   Wt 269 lb 12.8 oz (122.4 kg)   SpO2 96%   BMI 37.63  kg/m   Wt Readings from Last 5 Encounters:  01/30/20 269 lb 12.8 oz (122.4 kg)  01/30/20 269 lb (122 kg)  12/26/19 (!) 260 lb 9.6 oz (118.2 kg)  12/11/19 265 lb (120.2 kg)  08/20/19 260 lb 12.8 oz (118.3 kg)    BMI Readings from Last 5 Encounters:  01/30/20 37.63 kg/m  01/30/20 37.52 kg/m  12/26/19 36.35 kg/m  12/11/19 36.96 kg/m  08/20/19 36.37 kg/m     Physical Exam Vitals and nursing note reviewed.  Constitutional:      General: He is not in acute distress.    Appearance: Normal appearance. He is obese.  HENT:     Head: Normocephalic and atraumatic.     Right Ear: Hearing and external ear normal.     Left Ear: Hearing and external ear normal.     Nose: No mucosal edema.     Right Turbinates: Not enlarged.     Left Turbinates: Not enlarged.  Eyes:     Pupils: Pupils are equal, round, and reactive to light.  Cardiovascular:     Rate and Rhythm: Normal rate and regular rhythm.     Pulses: Normal pulses.     Heart sounds: Normal heart sounds. No murmur heard.   Pulmonary:     Effort: Pulmonary effort is normal.     Breath sounds: Normal breath sounds. No decreased breath sounds, wheezing or rales.  Musculoskeletal:     Cervical back: Normal range of motion.     Right lower leg: No edema.     Left lower leg: No edema.  Lymphadenopathy:     Cervical: No cervical adenopathy.  Skin:    General: Skin is warm and dry.     Capillary Refill: Capillary refill takes less than 2 seconds.     Findings: No erythema or rash.  Neurological:     General: No focal deficit present.     Mental Status: He is alert and oriented to person, place, and time.     Motor: No weakness.     Coordination: Coordination normal.     Gait: Gait is intact. Gait  normal.  Psychiatric:        Mood and Affect: Mood normal.        Behavior: Behavior normal. Behavior is cooperative.        Thought Content: Thought content normal.        Judgment: Judgment normal.       Assessment & Plan:   GERD (gastroesophageal reflux disease) Patient has no active concerns of aspiration or uncontrolled acid reflux Reviewed with patient that CT findings did show history of scarring due to suspected aspiration Reviewed with patient that he should contact us if he starts to notice that the symptoms are worsening  Plan: Continue Protonix Continue GERD lifestyle recommendations   SOB (shortness of breath) Agree that likely patient is having symptoms of abnormal thyroid  Plan: Proceed forward with cardiovascular work-up with heart monitor as well as lab work today  If symptoms persist despite adequate treatment of thyroid as well as cardiovascular concerns may need to proceed forward with pulmonary function testing.  We will delay this at this time given ongoing cardiovascular and thyroid work-up.  37-month follow-up with our office  Palpitations Plan: Continue thyroid work-up Continue cardiovascular work-up Continue forward with heart monitor  Abnormal findings on diagnostic imaging of lung Reviewed high-resolution CT chest findings with patient today Patient with history of concern for aspiration as well as recurrent pneumonia.  This is since resolved.  Patient feels that this is stable at this time Patient also reports better control of acid reflux  Patient is aware that there are findings of bronchiectasis.  Reviewed with patient that if he does need antibiotics in the future he may need a longer course.  Plan: 79-month follow-up with our office Continue to clinically monitor    Return in about 6 months (around 07/29/2020), or if symptoms worsen or fail to improve, for Follow up with Dr. Purnell Shoemaker.   Lauraine Rinne, NP 01/30/2020   This  appointment required 32 minutes of patient care (this includes precharting, chart review, review of results, face-to-face care, etc.).

## 2020-01-30 NOTE — Assessment & Plan Note (Addendum)
Plan: Continue thyroid work-up Continue cardiovascular work-up Continue forward with heart monitor Continue forward with endocrinology

## 2020-01-31 ENCOUNTER — Telehealth: Payer: Self-pay | Admitting: *Deleted

## 2020-01-31 NOTE — Telephone Encounter (Addendum)
See Dr. Theodosia Blender note below requesting EP review of AF episode detected on 01/26/20 at 05:06. EGM appears AF, 2 min 36 sec duration. No history of AF.

## 2020-01-31 NOTE — Telephone Encounter (Signed)
-----   Message from Antonieta Iba, RN sent at 01/31/2020 12:10 PM EDT ----- Regarding: Device Interrogation Please see message below from Dr. Radford Pax (event occurred 08/28)  "Please have EP review the pacer check from today to determine if the arrhythmia he had was atrial tachycardia or atrial fibrillation since he has no hx of afib in the past."  Thanks! Carly

## 2020-01-31 NOTE — Addendum Note (Signed)
Addended by: Gar Ponto on: 01/31/2020 07:08 AM   Modules accepted: Orders

## 2020-02-02 ENCOUNTER — Other Ambulatory Visit (INDEPENDENT_AMBULATORY_CARE_PROVIDER_SITE_OTHER): Payer: Medicare PPO

## 2020-02-02 DIAGNOSIS — R002 Palpitations: Secondary | ICD-10-CM

## 2020-02-03 DIAGNOSIS — R002 Palpitations: Secondary | ICD-10-CM | POA: Diagnosis not present

## 2020-02-05 ENCOUNTER — Ambulatory Visit: Payer: Medicare PPO

## 2020-02-07 NOTE — Telephone Encounter (Signed)
noted 

## 2020-02-07 NOTE — Telephone Encounter (Signed)
leaese let patient know that I reviewed his pacer check with eletrophysiology and it appears to be atrial fibrillation with is new for him.  It increases his risk of CVA.  His CHADS2VASC score is 3 and should be anticoagulated.  Please let patient know that I have sent a message to Dr. Alinda Money to see if he is ok with starting blood thinners

## 2020-02-07 NOTE — Telephone Encounter (Signed)
Per Dr. Huntley Dec with [facility]"  Routing back to Dr. Radford Pax.

## 2020-02-07 NOTE — Telephone Encounter (Signed)
This patient has been diagnosed with new paroxysmal atrial fibrillation.  I noticed he has metastatic prostate CA - are you ok if we start Eliquis for protection from cardioembolic events from PAF?

## 2020-02-08 ENCOUNTER — Telehealth: Payer: Self-pay

## 2020-02-08 NOTE — Telephone Encounter (Signed)
Left message for patient to call back  

## 2020-02-08 NOTE — Telephone Encounter (Signed)
Blake Margarita, MD  Antonieta Iba, RN Please let patient know that I talked with Dr. Alinda Money and he is ok with patient starting Apixaban 5mg  BID. He needs to let me know if he develops any blood in his urine.  Blake Margarita, MD  Antonieta Iba, RN Please have him stop his ASA

## 2020-02-08 NOTE — Telephone Encounter (Signed)
error 

## 2020-02-11 ENCOUNTER — Telehealth: Payer: Self-pay | Admitting: Cardiology

## 2020-02-11 MED ORDER — APIXABAN 5 MG PO TABS: 5.0000 mg | ORAL_TABLET | Freq: Two times a day (BID) | ORAL | 3 refills | Status: DC

## 2020-02-11 NOTE — Addendum Note (Signed)
Addended by: Antonieta Iba on: 02/11/2020 08:58 AM   Modules accepted: Orders

## 2020-02-11 NOTE — Telephone Encounter (Signed)
I have spoken to the patient. Please see previous phone note.

## 2020-02-11 NOTE — Telephone Encounter (Signed)
Patient returning call from [place] on Friday.

## 2020-02-11 NOTE — Telephone Encounter (Signed)
Spoke with the patient and advised him on recommendations from Dr. Radford Pax. He will start on Eliquis 5 mg BID and discontinue ASA. He is aware to let us know if he develops any blood in his urine.

## 2020-02-12 DIAGNOSIS — R7309 Other abnormal glucose: Secondary | ICD-10-CM | POA: Diagnosis not present

## 2020-02-12 DIAGNOSIS — R002 Palpitations: Secondary | ICD-10-CM | POA: Diagnosis not present

## 2020-02-12 DIAGNOSIS — L74519 Primary focal hyperhidrosis, unspecified: Secondary | ICD-10-CM | POA: Diagnosis not present

## 2020-02-12 DIAGNOSIS — I10 Essential (primary) hypertension: Secondary | ICD-10-CM | POA: Diagnosis not present

## 2020-02-12 DIAGNOSIS — I2510 Atherosclerotic heart disease of native coronary artery without angina pectoris: Secondary | ICD-10-CM | POA: Diagnosis not present

## 2020-02-12 DIAGNOSIS — E890 Postprocedural hypothyroidism: Secondary | ICD-10-CM | POA: Diagnosis not present

## 2020-02-13 DIAGNOSIS — H5202 Hypermetropia, left eye: Secondary | ICD-10-CM | POA: Diagnosis not present

## 2020-02-13 DIAGNOSIS — H5211 Myopia, right eye: Secondary | ICD-10-CM | POA: Diagnosis not present

## 2020-02-13 DIAGNOSIS — H401121 Primary open-angle glaucoma, left eye, mild stage: Secondary | ICD-10-CM | POA: Diagnosis not present

## 2020-02-13 DIAGNOSIS — H401111 Primary open-angle glaucoma, right eye, mild stage: Secondary | ICD-10-CM | POA: Diagnosis not present

## 2020-02-13 DIAGNOSIS — H401131 Primary open-angle glaucoma, bilateral, mild stage: Secondary | ICD-10-CM | POA: Diagnosis not present

## 2020-02-13 DIAGNOSIS — H524 Presbyopia: Secondary | ICD-10-CM | POA: Diagnosis not present

## 2020-02-13 DIAGNOSIS — H52223 Regular astigmatism, bilateral: Secondary | ICD-10-CM | POA: Diagnosis not present

## 2020-02-18 ENCOUNTER — Ambulatory Visit (INDEPENDENT_AMBULATORY_CARE_PROVIDER_SITE_OTHER): Payer: Medicare PPO | Admitting: *Deleted

## 2020-02-18 DIAGNOSIS — I442 Atrioventricular block, complete: Secondary | ICD-10-CM

## 2020-02-19 LAB — CUP PACEART REMOTE DEVICE CHECK
Date Time Interrogation Session: 20210920095846
Implantable Lead Implant Date: 20201221
Implantable Lead Implant Date: 20201221
Implantable Lead Location: 753859
Implantable Lead Location: 753860
Implantable Lead Model: 377169
Implantable Lead Model: 377171
Implantable Lead Serial Number: 7000011949
Implantable Lead Serial Number: 81083606
Implantable Pulse Generator Implant Date: 20201221
Pulse Gen Model: 407145
Pulse Gen Serial Number: 69703841

## 2020-02-20 ENCOUNTER — Other Ambulatory Visit: Payer: Self-pay | Admitting: *Deleted

## 2020-02-20 ENCOUNTER — Other Ambulatory Visit: Payer: Medicare PPO

## 2020-02-20 DIAGNOSIS — Z20822 Contact with and (suspected) exposure to covid-19: Secondary | ICD-10-CM

## 2020-02-20 NOTE — Progress Notes (Signed)
Remote pacemaker transmission.   

## 2020-02-21 LAB — SARS-COV-2, NAA 2 DAY TAT

## 2020-02-21 LAB — NOVEL CORONAVIRUS, NAA: SARS-CoV-2, NAA: NOT DETECTED

## 2020-02-27 ENCOUNTER — Telehealth: Payer: Self-pay

## 2020-02-27 DIAGNOSIS — I2510 Atherosclerotic heart disease of native coronary artery without angina pectoris: Secondary | ICD-10-CM

## 2020-02-27 MED ORDER — METOPROLOL SUCCINATE ER 25 MG PO TB24
25.0000 mg | ORAL_TABLET | Freq: Every day | ORAL | 3 refills | Status: DC
Start: 2020-02-27 — End: 2020-03-28

## 2020-02-27 NOTE — Telephone Encounter (Signed)
The patient has been notified of the result and verbalized understanding.  All questions (if any) were answered. Antonieta Iba, RN 02/27/2020 4:43 PM  Rx for Toprol XL 25 mg daily has been sent in. Patient is scheduled to see Melina Copa 10/29. Leane Call has been ordered.

## 2020-02-27 NOTE — Telephone Encounter (Signed)
-----   Message from Sueanne Margarita, MD sent at 02/27/2020  1:48 PM EDT ----- Heart monitor showed no afib.  There are extra heart beats from the top and bottom of his heart.  Recent echo with normal LVF.  Please get a lexiscan myoview to rule out ischemia.  PVC load < 1%. Start Toprol XL 25mg  daily and followup with PA in 2-3 weeks for uptitration of BB if needed.

## 2020-03-03 ENCOUNTER — Telehealth (HOSPITAL_COMMUNITY): Payer: Self-pay | Admitting: *Deleted

## 2020-03-03 NOTE — Telephone Encounter (Signed)
Patient given detailed instructions per Myocardial Perfusion Study Information Sheet for the test on  03/05/20. Patient notified to arrive 15 minutes early and that it is imperative to arrive on time for appointment to keep from having the test rescheduled.  If you need to cancel or reschedule your appointment, please call the office within 24 hours of your appointment. . Patient verbalized understanding. Blake Carter

## 2020-03-05 ENCOUNTER — Ambulatory Visit (HOSPITAL_COMMUNITY): Payer: Medicare PPO | Attending: Internal Medicine

## 2020-03-05 ENCOUNTER — Other Ambulatory Visit: Payer: Self-pay

## 2020-03-05 DIAGNOSIS — I2583 Coronary atherosclerosis due to lipid rich plaque: Secondary | ICD-10-CM | POA: Insufficient documentation

## 2020-03-05 DIAGNOSIS — I2510 Atherosclerotic heart disease of native coronary artery without angina pectoris: Secondary | ICD-10-CM | POA: Insufficient documentation

## 2020-03-05 LAB — MYOCARDIAL PERFUSION IMAGING
LV dias vol: 112 mL (ref 62–150)
LV sys vol: 47 mL
Peak HR: 71 {beats}/min
Rest HR: 62 {beats}/min
SDS: 0
SRS: 0
SSS: 0
TID: 1.16

## 2020-03-05 MED ORDER — REGADENOSON 0.4 MG/5ML IV SOLN
0.4000 mg | Freq: Once | INTRAVENOUS | Status: AC
  Administered 2020-03-05: 0.4 mg via INTRAVENOUS

## 2020-03-05 MED ORDER — TECHNETIUM TC 99M TETROFOSMIN IV KIT
10.6000 | PACK | Freq: Once | INTRAVENOUS | Status: AC | PRN
  Administered 2020-03-05: 10.6 via INTRAVENOUS
  Filled 2020-03-05: qty 11

## 2020-03-05 MED ORDER — TECHNETIUM TC 99M TETROFOSMIN IV KIT
32.2000 | PACK | Freq: Once | INTRAVENOUS | Status: AC | PRN
  Administered 2020-03-05: 32.2 via INTRAVENOUS
  Filled 2020-03-05: qty 33

## 2020-03-09 DIAGNOSIS — I4891 Unspecified atrial fibrillation: Secondary | ICD-10-CM

## 2020-03-10 MED ORDER — RIVAROXABAN 20 MG PO TABS: 20.0000 mg | ORAL_TABLET | Freq: Every day | ORAL | 11 refills | Status: DC

## 2020-03-10 NOTE — Telephone Encounter (Signed)
Called patient about his message and with Pharm D's recommendations. Patient stopped eliquis on Saturday. Patient will start Xarelto this evening and let our office know how he is doing with the change.  Informed patient to contact his PCP if his itching does not improve that it might be something else. Asked patient if he has taken anything for the itching, patient stated no. Informed patient that he could try something over the counter like benadryl to see if that helps with the itching. Will send message to Dr. Radford Pax and her nurse so they are aware.

## 2020-03-11 NOTE — Progress Notes (Signed)
  Radiation Oncology         [phone] ________________________________  Name: Blake Carter MRN: [id number]  Date: 10/15/2019  DOB: 01-Mar-1951  End of Treatment Note  Diagnosis:    69 y.o. 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     Plan: The patient has completed radiation treatment. The patient will return to radiation oncology clinic for routine followup in one month. I advised him to call or return sooner if he has any questions or concerns related to his recovery or treatment. ________________________________  Sheral Apley. Tammi Klippel, M.D.

## 2020-03-12 DIAGNOSIS — C61 Malignant neoplasm of prostate: Secondary | ICD-10-CM | POA: Diagnosis not present

## 2020-03-17 DIAGNOSIS — R7309 Other abnormal glucose: Secondary | ICD-10-CM | POA: Diagnosis not present

## 2020-03-17 DIAGNOSIS — R002 Palpitations: Secondary | ICD-10-CM | POA: Diagnosis not present

## 2020-03-17 DIAGNOSIS — L74519 Primary focal hyperhidrosis, unspecified: Secondary | ICD-10-CM | POA: Diagnosis not present

## 2020-03-17 DIAGNOSIS — E890 Postprocedural hypothyroidism: Secondary | ICD-10-CM | POA: Diagnosis not present

## 2020-03-19 DIAGNOSIS — Z5111 Encounter for antineoplastic chemotherapy: Secondary | ICD-10-CM | POA: Diagnosis not present

## 2020-03-19 DIAGNOSIS — C61 Malignant neoplasm of prostate: Secondary | ICD-10-CM | POA: Diagnosis not present

## 2020-03-24 DIAGNOSIS — I10 Essential (primary) hypertension: Secondary | ICD-10-CM | POA: Diagnosis not present

## 2020-03-24 DIAGNOSIS — R7309 Other abnormal glucose: Secondary | ICD-10-CM | POA: Diagnosis not present

## 2020-03-24 DIAGNOSIS — E890 Postprocedural hypothyroidism: Secondary | ICD-10-CM | POA: Diagnosis not present

## 2020-03-24 DIAGNOSIS — I2510 Atherosclerotic heart disease of native coronary artery without angina pectoris: Secondary | ICD-10-CM | POA: Diagnosis not present

## 2020-03-26 ENCOUNTER — Encounter: Payer: Self-pay | Admitting: Physician Assistant

## 2020-03-26 NOTE — Progress Notes (Signed)
Cardiology Office Note    Date:  03/28/2020   ID:  Blake Carter, DOB 1951-05-07, MRN [id number]  PCP:  Hulan Fess, MD  Cardiologist:  Fransico Him, MD  Electrophysiologist:  Cristopher Peru, MD   Chief Complaint: f/u atrial fib/palpitations  History of Present Illness:   Blake Carter is a 69 y.o. male with history of (va-va-litis) is a 69 y.o. 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  He is historially followed by Dr. Radford Pax and Dr. Lovena Le with the above history. Last cath was in 2018 showing patent stents, moderately elevated LVEDP. He underwent pacemaker implantation in 2020 for progressive bradycardia. Last echo 11/2019 EF 60-65%, grade 1 DD, mild AI (normal valve structure), moderate dilation of ascending aorta (76m) with CTA showing 4.4cm. More recently he was seen in the office 01/30/20 with episodic palpitations associated with SOB and chest discomfort. He wore a monitor showing NSR/ST average HR 67bpm, nonsustained atrial tachycardia, intermittent V pacing, occsaional PVCs, bigeminal PVCs, trigeminal PVCs and ventricular couplets and triplets, and wide complex tachycardia up to 5 beats. He was started on metoprolol. Nuclear stress test was normal. It does appear that on pacemaker interrogation on 01/26/20 the paitent had actually had an episode of atrial fibrillation (prior to event monitor) therefore he was started on anticoagulation. He had itching with Eliquis so was changed to Xarelto.  He is seen back for follow-up today feeling about the same.  The itching persists even with the switch to Xarelto, but no associated lesions, hives, erythema, just a  sensation of itching.  He wonders if perhaps this could be related to the metoprolol as he had started at around the same time.  He continues to feel about the same.  He feels absolutely fine when he is not having palpitations but when the racing and skipping starts, he feels as though his exercise tolerance is greatly decreased.  He has been working with Dr. BChalmers Caterwith endocrinology to manage his thyroid fluctuations.   Labwork independently reviewed: 02/2020 TSH 0.450 01/2020 TSH elevated 6.2 but fT4 wnl, BMET wnl Cr 1.15, Hgb 11.8 (chronic appearing) 09/2019 trig 169, LDL 47, LFTs wnl   Past Medical History:  Diagnosis Date  . Allergic rhinitis   . Arthritis    "a little bit in some of my joints" (06/20/2014)  . Ascending aorta dilatation (HCC)   . Chronic diastolic CHF (congestive heart failure) ([facility]   . Coronary artery disease    a. 05/2014 Cath/PCI: LM nl, LAD 40p, 60-70d (FFR 0.80->3.0x24 Synergy DES), D2 40, LCX 90d (2.25x12 Synergy DES), RCA 20-390mEF 60-65%. b. Cath 02/2017 without acute change, elev LVEDP suggestive of dCHF.  . Marland Kitchenepression   . Dilated aortic root (HCC)    aortic root at 4482my Chest CTA 11/2019  . ED (erectile dysfunction)   . Environmental allergies    dry cough  . GERD (gastroesophageal reflux disease)   . History of hiatal hernia   . Hydronephrosis of right kidney    a. s/p ureteral stenting in the past.  . Hyperlipidemia    under control  . Hypertension   . Hypothyroidism   . OCD (obsessive compulsive disorder)   . Pacemaker   . PAF (paroxysmal atrial fibrillation) (  HCC)   . Panic disorder   . PAT (paroxysmal atrial tachycardia) ([facility])   . PONV (postoperative nausea and vomiting)    after thyroid surgery no problems in 10 years   . Prostate cancer [facility])    prostate cancer- robotic prostatectomy - followed by radiation because of positive lymph node--and pt on lupron injections  . PVC's (premature ventricular contractions)   . Sinus bradycardia     a. chronic, asymptomatic.  24 hour Holter 10/2016 showed Sinus bradycardia, sinus arrhythmia and normal sinus rhythm with average heart rate 50bpm and heart rate ranged from 32 to 100bpm.  . SUI (stress urinary incontinence), male    S/P ROBOTIC PROSTATECTOMY AND RADIATION TX    Past Surgical History:  Procedure Laterality Date  . COLONOSCOPY  07/2013   Dr. Oletta Lamas  . CORONARY ANGIOPLASTY WITH STENT PLACEMENT  06/20/2014   "2"  . CYSTOSCOPY  11/30/2011   Procedure: CYSTOSCOPY;  Surgeon: Reece Packer, MD;  Location: [facility];  Service: Urology;  Laterality: N/A;  Inplantation of Artificial Sphincter and Cystoscopy  . CYSTOSCOPY WITH RETROGRADE PYELOGRAM, URETEROSCOPY AND STENT PLACEMENT Right 02/11/2014   Procedure: CYSTOSCOPY WITH RETROGRADE PYELOGRAM, URETEROSCOPY ,URETERAL BIOPSY AND STENT PLACEMENT;  Surgeon: Raynelle Bring, MD;  Location: [facility];  Service: Urology;  Laterality: Right;  . CYSTOSCOPY WITH RETROGRADE PYELOGRAM, URETEROSCOPY AND STENT PLACEMENT Right 04/15/2014   Procedure: CYSTOSCOPY WITH RETROGRADE PYELOGRAM, URETEROSCOPY, BIOPSY AND STENT PLACEMENT;  Surgeon: Raynelle Bring, MD;  Location: [facility];  Service: Urology;  Laterality: Right;  . LEFT HEART CATH AND CORONARY ANGIOGRAPHY N/A 03/29/2017   Procedure: LEFT HEART CATH AND CORONARY ANGIOGRAPHY;  Surgeon: Leonie Man, MD;  Location: [facility];  Service: Cardiovascular;  Laterality: N/A;  . LEFT HEART CATHETERIZATION WITH CORONARY ANGIOGRAM N/A 06/20/2014   Procedure: LEFT HEART CATHETERIZATION WITH CORONARY ANGIOGRAM;  Surgeon: Leonie Man, MD;  [facility] C[facility];  Service: Cardiovascular;  Laterality: N/A;  . PACEMAKER IMPLANT N/A 05/21/2019   Procedure: PACEMAKER IMPLANT;  Surgeon: Evans Lance, MD;  Location: [facility] CV [facility];  Service: Cardiovascular;  Laterality: N/A;  . PERCUTANEOUS CORONARY STENT INTERVENTION (PCI-S)  06/20/2014   Procedure: PERCUTANEOUS CORONARY STENT INTERVENTION (PCI-S);   Surgeon: Leonie Man, MD;  Location: [facility] CATH LAB;  Service: Cardiovascular;;  LAD and Circumflex  . REFRACTIVE SURGERY Bilateral 2004  . ROBOT ASSISTED LAPAROSCOPIC RADICAL PROSTATECTOMY  05/2010  . THYROIDECTOMY, PARTIAL  10/1974  . TONSILLECTOMY  1956   . TOTAL THYROIDECTOMY  10/2012   "nuked it"  . UPPER GI ENDOSCOPY     food impaction 2009 done by Dr Oletta Lamas  . URINARY SPHINCTER IMPLANT  11/30/2011   Procedure: ARTIFICIAL URINARY SPHINCTER;  Surgeon: Reece Packer, MD;  Location: [facility];  Service: Urology;  Laterality: N/A;    Current Medications: Current Meds  Medication Sig  . acetaminophen (TYLENOL) 500 MG tablet Take 1,000 mg by mouth every 6 (six) hours as needed (pain).  . Cholecalciferol (VITAMIN D3) 2000 units TABS Take 6,000 Units by mouth 3 (three) times a week.   . diazepam (VALIUM) 5 MG tablet Take 1 tablet (5 mg total) by mouth at bedtime.  . fenofibrate (TRICOR) 145 MG tablet Take 1 tablet (145 mg total) by mouth daily.  . fexofenadine (ALLEGRA) 180 MG tablet Take 180 mg by mouth daily.  . fluvoxaMINE (LUVOX) 50 MG tablet Take 200 mg by mouth every morning.   . furosemide (LASIX) 20 MG tablet TAKE 1  TABLET BY MOUTH  DAILY AS NEEDED FOR FLUID  OR EDEMA  . latanoprost (XALATAN) 0.005 % ophthalmic solution Place 1 drop into both eyes at bedtime.   Marland Kitchen leuprolide, 6 Month, (ELIGARD) 45 MG injection Inject 45 mg into the skin every 6 (six) months.  . levothyroxine (SYNTHROID) 175 MCG tablet PT TAKES 1 TABLET BY MOUTH DAILY EXCEPT SUNDAYS  . lisinopril (ZESTRIL) 10 MG tablet Take 1 tablet by mouth daily.  Marland Kitchen MYRBETRIQ 50 MG TB24 tablet Take 50 mg by mouth daily.  . nitroGLYCERIN (NITROSTAT) 0.4 MG SL tablet PLACE 1 TABLET (0.4 MG TOTAL) UNDER THE TONGUE EVERY 5 (FIVE) MINUTES AS NEEDED FOR CHEST PAIN.  Marland Kitchen pantoprazole (PROTONIX) 40 MG tablet Take 2 tablets (80 mg total) by mouth daily.  . rivaroxaban (XARELTO) 20 MG TABS tablet Take 1 tablet (20 mg total) by mouth daily with  supper.  Marland Kitchen amLODipine (NORVASC) 10 MG tablet Take 1 tablet (10 mg total) by mouth every evening.  . metoprolol succinate (TOPROL XL) 25 MG 24 hr tablet Take 1 tablet (25 mg total) by mouth daily.        Allergies:   Tagamet [cimetidine], Atorvastatin, and Niacin and related   Social History   Socioeconomic History  . Marital status: Married    Spouse name: Not on file  . Number of children: Not on file  . Years of education: Not on file  . Highest education level: Not on file  Occupational History  . Not on file  Tobacco Use  . Smoking status: Never Smoker  . Smokeless tobacco: Never Used  Vaping Use  . Vaping Use: Never used  Substance and Sexual Activity  . Alcohol use: Yes    Alcohol/week: 0.0 standard drinks    Comment: 06/20/2014 "1-2 drinks a couple times/month"  . Drug use: No  . Sexual activity: Yes  Other Topics Concern  . Not on file  Social History Narrative  . Not on file   Social Determinants of Health   Financial Resource Strain:   . Difficulty of Paying Living Expenses: Not on file  Food Insecurity:   . Worried About Charity fundraiser in the Last Year: Not on file  . Ran Out of Food in the Last Year: Not on file  Transportation Needs:   . Lack of Transportation (Medical): Not on file  . Lack of Transportation (Non-Medical): Not on file  Physical Activity:   . Days of Exercise per Week: Not on file  . Minutes of Exercise per Session: Not on file  Stress:   . Feeling of Stress : Not on file  Social Connections:   . Frequency of Communication with Friends and Family: Not on file  . Frequency of Social Gatherings with Friends and Family: Not on file  . Attends Religious Services: Not on file  . Active Member of Clubs or Organizations: Not on file  . Attends Archivist Meetings: Not on file  . Marital Status: Not on file     Family History:  The patient's family history includes CAD in his brother; Cancer in his father; Heart disease  in his father; Hyperlipidemia in his father.  ROS:   Please see the history of present illness. Denies any unusual bleeding on Eliquis. All other systems are reviewed and otherwise negative.    EKGs/Labs/Other Studies Reviewed:    Studies reviewed are outlined and summarized above. Reports included below if pertinent.  2D echo 713/21 IMPRESSIONS    1.  Left ventricular ejection fraction, by estimation, is 60 to 65%. The  left ventricle has normal function. The left ventricle has no regional  wall motion abnormalities. Left ventricular diastolic parameters are  consistent with Grade I diastolic  dysfunction (impaired relaxation).  2. Right ventricular systolic function is normal. The right ventricular  size is normal. There is normal pulmonary artery systolic pressure.  3. The mitral valve is normal in structure. No evidence of mitral valve  regurgitation. No evidence of mitral stenosis.  4. The aortic valve is normal in structure. Aortic valve regurgitation is  mild. No aortic stenosis is present.  5. Aortic dilatation noted. There is moderate dilatation of the ascending  aorta measuring 46 mm.  6. The inferior vena cava is normal in size with greater than 50%  respiratory variability, suggesting right atrial pressure of 3 mmHg.   Comparison(s): Prior ECHO 42 mm ascending aorta. Current 45 mm. Consider  CTA of aorta for comparison.     EKG:  EKG is not ordered today.  Recent Labs: 10/22/2019: ALT 20 12/26/2019: Hemoglobin 11.8; Platelets 247.0 01/30/2020: BUN 17; Creatinine, Ser 1.15; Potassium 4.7; Sodium 143; TSH 6.200  Recent Lipid Panel    Component Value Date/Time   CHOL 120 10/22/2019 0807   TRIG 169 (H) 10/22/2019 0807   HDL 45 10/22/2019 0807   CHOLHDL 2.7 10/22/2019 0807   CHOLHDL 3.8 03/29/2017 0554   VLDL 34 03/29/2017 0554   LDLCALC 47 10/22/2019 0807    PHYSICAL EXAM:    VS:  BP 110/72   Pulse 70   Ht '5\' 11"'  (1.803 m)   Wt 267 lb 12.8 oz (121.5  kg)   SpO2 97%   BMI 37.35 kg/m   BMI: Body mass index is 37.35 kg/m.  GEN: Well nourished, well developed WM, in no acute distress HEENT: normocephalic, atraumatic Neck: no JVD, carotid bruits, or masses Cardiac: RRR; no murmurs, rubs, or gallops, no edema  Respiratory:  clear to auscultation bilaterally, normal work of breathing GI: soft, nontender, nondistended, + BS MS: no deformity or atrophy Skin: warm and dry, no rash Neuro:  Alert and Oriented x 3, Strength and sensation are intact, follows commands Psych: euthymic mood, full affect  Wt Readings from Last 3 Encounters:  03/28/20 267 lb 12.8 oz (121.5 kg)  03/05/20 269 lb (122 kg)  01/30/20 269 lb 12.8 oz (122.4 kg)     ASSESSMENT & PLAN:    1. Paroxysmal atrial fibrillation (also with PAT, PVCs on monitor), also having itching -it sounds as though despite metoprolol he is still having continued breakthrough symptoms that are bothersome and reduce his exercise tolerance.  He is also having itching ever since starting anticoagulation and metoprolol.  I spoke with our pharmacist who feels it may be the metoprolol.  It is challenging to know if this is related to the beta-blocker itself or the an active ingredient.  The most definitive way to identify whether it is metoprolol at all would be to get him off of this class for now.  We will stop metoprolol and start diltiazem 180 mg daily.  This dose was chosen for combination of both heart rate and blood pressure control as we will have to stop his amlodipine.  See below regarding additional recommendations for hypertension.  I would also like to make sure he has had a recent c-Met and CBC.  We will call over to his endocrinology office and if he has not had that with recent blood work, we  will go ahead and obtain.  Pharmacy team recommended he give it about 2 weeks to see if the itching stops with this medicine change.  I did give him instructions to call if there is any worsening or  lack of resolution.  Given his arrhythmia issues I think he would best be served following up with his electrophysiology team in the near future for further management. 2. CAD -stress test recently was low risk.  Dr. Radford Pax stopped his aspirin when she started anticoagulation.  Continue statin.  His symptoms seem to only occur in the presence of palpitations. 3. Essential HTN -blood pressure is currently controlled.  Per previous phone notes he had previously cut down his lisinopril from 20 to 10 mg daily due to lower blood pressure.  With the change from amlodipine to diltiazem he may experience an increase in blood pressure.  I gave him instructions for monitoring on his AVS and he will call if it begins to run higher than 419 systolic with this med change. 4. Hyperlipidemia -last LDL was well controlled in May.  Continue statin. 5. Dilated ascending aorta -it appears from July 2021 CT that Dr. Radford Pax was considering a repeat study in 6 months.  We will have him follow-up with her in about 3 months at which time this can be finalized.  Blood pressure is controlled, but we will monitor closely as above. 6. Chronic diastolic CHF - he feels at baseline, only has to use Lasix sporadically. Advised caution with salt especially as he heads back to [place] this weekend.  Disposition: F/u with EP team in 6 weeks to f/u arrhythmias and Dr. Radford Pax in 3 months.  Medication Adjustments/Labs and Tests Ordered: Current medicines are reviewed at length with the patient today.  Concerns regarding medicines are outlined above. Medication changes, Labs and Tests ordered today are summarized above and listed in the Patient Instructions accessible in Encounters.   Signed, Charlie Pitter, PA-C  03/28/2020 8:58 AM    [address], [place], [place]  [address] Phone: [phone]; Fax: [phone]

## 2020-03-28 ENCOUNTER — Encounter: Payer: Self-pay | Admitting: Physician Assistant

## 2020-03-28 ENCOUNTER — Ambulatory Visit: Payer: Medicare PPO | Admitting: Physician Assistant

## 2020-03-28 ENCOUNTER — Other Ambulatory Visit: Payer: Self-pay

## 2020-03-28 VITALS — BP 110/72 | HR 70 | Ht 71.0 in | Wt 267.8 lb

## 2020-03-28 DIAGNOSIS — I471 Supraventricular tachycardia: Secondary | ICD-10-CM

## 2020-03-28 DIAGNOSIS — I480 Paroxysmal atrial fibrillation: Secondary | ICD-10-CM

## 2020-03-28 DIAGNOSIS — E785 Hyperlipidemia, unspecified: Secondary | ICD-10-CM | POA: Diagnosis not present

## 2020-03-28 DIAGNOSIS — I10 Essential (primary) hypertension: Secondary | ICD-10-CM

## 2020-03-28 DIAGNOSIS — I77810 Thoracic aortic ectasia: Secondary | ICD-10-CM | POA: Diagnosis not present

## 2020-03-28 DIAGNOSIS — I493 Ventricular premature depolarization: Secondary | ICD-10-CM

## 2020-03-28 DIAGNOSIS — I2510 Atherosclerotic heart disease of native coronary artery without angina pectoris: Secondary | ICD-10-CM

## 2020-03-28 DIAGNOSIS — I5032 Chronic diastolic (congestive) heart failure: Secondary | ICD-10-CM | POA: Diagnosis not present

## 2020-03-28 DIAGNOSIS — L299 Pruritus, unspecified: Secondary | ICD-10-CM

## 2020-03-28 LAB — COMPREHENSIVE METABOLIC PANEL
ALT: 27 IU/L (ref 0–44)
AST: 21 IU/L (ref 0–40)
Albumin/Globulin Ratio: 2 (ref 1.2–2.2)
Albumin: 4.7 g/dL (ref 3.8–4.8)
Alkaline Phosphatase: 71 IU/L (ref 44–121)
BUN/Creatinine Ratio: 26 — ABNORMAL HIGH (ref 10–24)
BUN: 32 mg/dL — ABNORMAL HIGH (ref 8–27)
Bilirubin Total: 0.2 mg/dL (ref 0.0–1.2)
CO2: 23 mmol/L (ref 20–29)
Calcium: 9.9 mg/dL (ref 8.6–10.2)
Chloride: 103 mmol/L (ref 96–106)
Creatinine, Ser: 1.21 mg/dL (ref 0.76–1.27)
GFR calc Af Amer: 71 mL/min/{1.73_m2} (ref >59)
GFR calc non Af Amer: 61 mL/min/{1.73_m2} (ref >59)
Globulin, Total: 2.3 g/dL (ref 1.5–4.5)
Glucose: 111 mg/dL — ABNORMAL HIGH (ref 65–99)
Potassium: 5 mmol/L (ref 3.5–5.2)
Sodium: 139 mmol/L (ref 134–144)
Total Protein: 7 g/dL (ref 6.0–8.5)

## 2020-03-28 LAB — CBC
Hematocrit: 35.5 % — ABNORMAL LOW (ref 37.5–51.0)
Hemoglobin: 11.4 g/dL — ABNORMAL LOW (ref 13.0–17.7)
MCH: 28.6 pg (ref 26.6–33.0)
MCHC: 32.1 g/dL (ref 31.5–35.7)
MCV: 89 fL (ref 79–97)
Platelets: 264 10*3/uL (ref 150–450)
RBC: 3.99 x10E6/uL — ABNORMAL LOW (ref 4.14–5.80)
RDW: 13.1 % (ref 11.6–15.4)
WBC: 5.7 10*3/uL (ref 3.4–10.8)

## 2020-03-28 MED ORDER — DILTIAZEM HCL ER COATED BEADS 180 MG PO CP24
180.0000 mg | ORAL_CAPSULE | Freq: Every day | ORAL | 3 refills | Status: DC
Start: 2020-03-28 — End: 2020-04-17

## 2020-03-28 NOTE — Patient Instructions (Addendum)
Medication Instructions:  Your physician has recommended you make the following change in your medication:  1.  STOP Amlodipine 2.  STOP Metoprolol  3.  START Diltiazem 180 mg taking 1 tablet daily   *If you need a refill on your cardiac medications before your next appointment, please call your pharmacy*   Lab Work: TODAY: CMET & CBC  If you have labs (blood work) drawn today and your tests are completely normal, you will receive your results only by: Marland Kitchen MyChart Message (if you have MyChart) OR . A paper copy in the mail If you have any lab test that is abnormal or we need to change your treatment, we will call you to review the results.   Testing/Procedures:    Follow-Up: At [facility], you and your health needs are our priority.  As part of our continuing mission to provide you with exceptional heart care, we have created designated Provider Care Teams.  These Care Teams include your primary Cardiologist (physician) and Advanced Practice Providers (APPs -  Physician Assistants and Nurse Practitioners) who all work together to provide you with the care you need, when you need it.  We recommend signing up for the patient portal called "MyChart".  Sign up information is provided on this After Visit Summary.  MyChart is used to connect with patients for Virtual Visits (Telemedicine).  Patients are able to view lab/test results, encounter notes, upcoming appointments, etc.  Non-urgent messages can be sent to your provider as well.   To learn more about what you can do with MyChart, go to [email or web address].    Your next appointment:   6 week(s)  (Someone will call you with this appointment)  3 months   07/02/19 ARRIVE AT 8:05   The format for your next appointment:   In Person   In [place]   Provider:   Cristopher Peru, MD   Fransico Him, MD     Other Instructions With the medicine changes we are making, you may experience an increase in your blood pressure. Please monitor  this at least 3 hours after taking your morning medicines once-twice a day after making the switch and notify our office if you are seeing readings greater than 130 on the top number or 80 on the bottom number.  Call us if itching worsens or does not improve after 6 weeks

## 2020-03-29 ENCOUNTER — Other Ambulatory Visit: Payer: Self-pay | Admitting: Cardiology

## 2020-03-29 DIAGNOSIS — E7800 Pure hypercholesterolemia, unspecified: Secondary | ICD-10-CM

## 2020-04-01 ENCOUNTER — Telehealth: Payer: Self-pay | Admitting: Adult Health

## 2020-04-01 ENCOUNTER — Other Ambulatory Visit: Payer: Self-pay | Admitting: Adult Health

## 2020-04-01 DIAGNOSIS — F411 Generalized anxiety disorder: Secondary | ICD-10-CM

## 2020-04-01 MED ORDER — ALPRAZOLAM 0.25 MG PO TABS: 0.2500 mg | ORAL_TABLET | Freq: Two times a day (BID) | ORAL | 0 refills | Status: DC | PRN

## 2020-04-01 NOTE — Telephone Encounter (Signed)
Noted thank you

## 2020-04-01 NOTE — Telephone Encounter (Signed)
Script sent  

## 2020-04-01 NOTE — Telephone Encounter (Signed)
Blake Carter called to request that something be called in to help him be calm for flying.  He will be flying later this week.  Send to [facility] on [address].

## 2020-04-01 NOTE — Telephone Encounter (Signed)
Please review

## 2020-04-04 NOTE — Telephone Encounter (Signed)
I am off but checking in messages.   At most recent OV, the patient had reported itching since starting both metoprolol and blood thinners. Despite switch from Eliquis to Xarelto, itching persisted. I had spoken with pharmacy and we discussed that the metoprolol may have been contributing to his itching. Therefore we discontinued metoprolol and started diltiazem in its place (for afib/palpitations). Since diltiazem is in the same class as his amlodipine he was on, we had to stop amlodipine. I did suspect he may need some adjustment in his BP regimen since diltiazem sometimes does not control BP as much as amlodipine does (but works well for afib).  Per 10/29 result note, the itching stopped as soon as he stopped metoprolol but it looks like in message below he was possibly still taking the metoprolol when the itching stopped? Please get more information on what exactly he is taking at this moment. Also need to know what he means by not working as well (palpitations, heart rate, blood pressure, etc). It is certainly possible to do a combo of metoprolol and lisinopril as long as he no longer has the itching, but will likely need to increase the lisinopril to account for being off the amlodipine because otherwise his BP will be high.  Thanks [employer]

## 2020-04-04 NOTE — Telephone Encounter (Signed)
Call placed to pt regarding message below.  Per pt, he must have misspoke when he said he stopped the Metoprolol and the itching stopped.  He states that he stopped the Amlodipine 10/29 and only took the Metoprolol on 10/30 & 10/31 with no itching. Pt reports Diltiazem isn't "as strong" as his palpitations are more intense, or faster, he said.  He hasn't been monitoring his bp yet, as he is still out of town and stated that he will be home this weekend and will make any changes on Monday if needed.

## 2020-04-04 NOTE — Telephone Encounter (Signed)
Called pt back after speaking with Melina Copa, PA-C, to let him know to continue with the Metoprolol, Lisinopril & Diltiazem, as he explained to me in the earlier conversation. When I advised pt of this, he said he wasn't taking Metoprolol, even after me going over the time line that he gave me both with dates and medications, he stated he was on Metoprolol and Dilitiazem. Now pt says he has stopped the Metoprolol on 10/31 and is only taking Lisinopril and Diltiazem. I advised pt to continue with them 2 medications, get a bp cuff and monitor and monitor his bp and call us back next week with some readings. Pt agreed and will call us with some updates.

## 2020-04-17 ENCOUNTER — Ambulatory Visit (INDEPENDENT_AMBULATORY_CARE_PROVIDER_SITE_OTHER): Payer: Medicare PPO | Admitting: Pharmacist

## 2020-04-17 ENCOUNTER — Other Ambulatory Visit: Payer: Self-pay

## 2020-04-17 VITALS — BP 136/86 | HR 67

## 2020-04-17 DIAGNOSIS — I10 Essential (primary) hypertension: Secondary | ICD-10-CM | POA: Diagnosis not present

## 2020-04-17 MED ORDER — APIXABAN 5 MG PO TABS: 5.0000 mg | ORAL_TABLET | Freq: Two times a day (BID) | ORAL | 1 refills | Status: DC

## 2020-04-17 MED ORDER — METOPROLOL SUCCINATE ER 50 MG PO TB24
50.0000 mg | ORAL_TABLET | Freq: Every day | ORAL | 3 refills | Status: DC
Start: 2020-04-17 — End: 2020-04-29

## 2020-04-17 MED ORDER — FUROSEMIDE 20 MG PO TABS: ORAL_TABLET | ORAL | 0 refills | Status: DC

## 2020-04-17 NOTE — Patient Instructions (Addendum)
It was nice to see you today!  Your blood pressure goal is < 130/18mmHg  STOP Xarelto. START Eliquis 5mg  twice daily  STOP diltiazem. START metoprolol succinate 50mg  once daily  Monitor your blood pressure at home  I'll give you a call Tuesday 11/30 to see how your blood pressure readings are. We can increase your lisinopril dose if needed. Call me with any concerns before then [phone]

## 2020-04-17 NOTE — Progress Notes (Signed)
Patient ID: Blake Carter                 DOB: 04-30-1951                      MRN: [id number]     HPI: Blake Carter is a 69 y.o. male referred by Dr. Radford Pax to HTN clinic. PMH is significant for hx of CAD (DES to LCx 05/2014 with residual LAD disease with repeat cath 02/2017 with patent stents and otherwise mild nonobstructive disease), HTN, dyslipidemia, sinus bradycardia, dilated aortic root (70mm by echo 10/2018), chronic diastolic CHF, allergic rhinits, GERD, hypothyroidism, hx of prostate cancer, hx of hydronephrosis of right kidney (s/p ureteral stenting), depression, and insomnia. Lipid meds have previously been adjusted (atorvastatin changed to rosuvastatin, gemfibrozil changed to fenofibrate due to prior myalgias, did not want injectable therapy or fish oil due to potential correlation with prostate cancer). Most recently, he has been followed in PharmD clinic for BP management with last visit in July. At that time, his BP was well controlled on amlodipine 10mg  daily and lisinopril 10mg  daily. Home BP cuff calibrated accurately.   Echo 7/21 showed LVEF 60-65%, chest CT showed slight enlargement of ascending aortic aneurysm. Pt sent a MyChart message in August reporting elevated home SBP readings increased in the 130-140s. I increased his lisinopril to 20mg  daily. His recently started Wellbutrin was also likely contributing to elevated BP. BP and labs were stable at follow up visit with Dr Radford Pax 01/2020. He wore a Zio patch that detected afib and has since been started on Eliquis. He stopped his Wellbutrin and noted lower SBP in the 100s with accompanied dizziness. Lisinopril was decreased back to 10mg  daily. He was then started on Toprol 25mg  02/27/20 due to extra beats on his monitor. He experienced itching on Eliquis and was subsequently changed to Xarelto. His itching persisted and his metoprolol was stopped and changed to diltiazem. It sounds as though his itching stopped when he stopped his  amlodipine, although there seemed to be some confusion over what meds pt was taking (metoprolol vs diltiazem) based on 11/4 MyChart message.  Pt presents today in good spirits. Wants to change back to Eliquis since this was ruled out as the cause of his itching - the pharmacy took 4 days to order Xarelto so he resumed previous supply of Eliquis he had at home. He also wants to change from diltiazem back to metoprolol at a higher dose. Diltiazem is not working as well to control his tachycardia and he has been more symptomatic lately. Reports metoprolol 25mg  controlled about 85% of his symptoms previously. Tolerating lisinopril well, rarely uses prn furosemide. BP was 130/80 yesterday, did have one high reading of 195/95.  Current HTN medications: lisinopril 10mg  daily,  Diltiazem 180mg  daily, furosemide 20mg  prn  Previously tried: amlodipine 10mg  - itching  BP goal: <130/60mmHg  Family History: father (cancer (died from small lung cancer per pt report), heart disease ("multiple bypasses", "aorta replaced"), HLD); brother (CAD); "everyone on my dad's side had some kind of coronary disease / MI / heart failure"; grandmother (elevated TG)  Social History: never used tobacco, occasional alcohol use (1x or 2x per month)  Diet:  Breakfast: eggs with cheese and ham on an english muffin Lunch: salad or sandwich (sometimes skips lunch) Dinner: protein with vegetables with a starch  Beverages: drinks mostly water or propel (2x per day in 1 32oz water) Does not use added salt on foods (Uses Mrs  Dash instead if needed) Only goes out to eat 1-2 times per week  Exercise: walks 30 minutes to 1 hr 5-6 times per week, not as much lately due to heat  Wt Readings from Last 3 Encounters:  03/28/20 267 lb 12.8 oz (121.5 kg)  03/05/20 269 lb (122 kg)  01/30/20 269 lb 12.8 oz (122.4 kg)   BP Readings from Last 3 Encounters:  03/28/20 110/72  01/30/20 118/78  01/30/20 112/82   Pulse Readings from Last 3  Encounters:  03/28/20 70  01/30/20 68  01/30/20 80    Renal function: CrCl cannot be calculated (Unknown ideal weight.).  Past Medical History:  Diagnosis Date  . Allergic rhinitis   . Arthritis    "a little bit in some of my joints" (06/20/2014)  . Ascending aorta dilatation (HCC)   . Chronic diastolic CHF (congestive heart failure) ([place])   . Coronary artery disease    a. 05/2014 Cath/PCI: LM nl, LAD 40p, 60-70d (FFR 0.80->3.0x24 Synergy DES), D2 40, LCX 90d (2.25x12 Synergy DES), RCA 20-12m, EF 60-65%. b. Cath 02/2017 without acute change, elev LVEDP suggestive of dCHF.  Marland Kitchen Depression   . Dilated aortic root (HCC)    aortic root at 40mm by Chest CTA 11/2019  . ED (erectile dysfunction)   . Environmental allergies    dry cough  . GERD (gastroesophageal reflux disease)   . History of hiatal hernia   . Hydronephrosis of right kidney    a. s/p ureteral stenting in the past.  . Hyperlipidemia    under control  . Hypertension   . Hypothyroidism   . OCD (obsessive compulsive disorder)   . Pacemaker   . PAF (paroxysmal atrial fibrillation) ([place])   . Panic disorder   . PAT (paroxysmal atrial tachycardia) ([place])   . PONV (postoperative nausea and vomiting)    after thyroid surgery no problems in 10 years   . Prostate cancer [facility])    prostate cancer- robotic prostatectomy - followed by radiation because of positive lymph node--and pt on lupron injections  . PVC's (premature ventricular contractions)   . Sinus bradycardia    a. chronic, asymptomatic.  24 hour Holter 10/2016 showed Sinus bradycardia, sinus arrhythmia and normal sinus rhythm with average heart rate 50bpm and heart rate ranged from 32 to 100bpm.  . SUI (stress urinary incontinence), male    S/P ROBOTIC PROSTATECTOMY AND RADIATION TX    Current Outpatient Medications on File Prior to Visit  Medication Sig Dispense Refill  . acetaminophen (TYLENOL) 500 MG tablet Take 1,000 mg by mouth every 6 (six) hours as needed  (pain).    Marland Kitchen ALPRAZolam (XANAX) 0.25 MG tablet Take 1 tablet (0.25 mg total) by mouth 2 (two) times daily as needed for anxiety. 60 tablet 0  . Cholecalciferol (VITAMIN D3) 2000 units TABS Take 6,000 Units by mouth 3 (three) times a week.     . diazepam (VALIUM) 5 MG tablet Take 1 tablet (5 mg total) by mouth at bedtime. 30 tablet 2  . diltiazem (CARDIZEM CD) 180 MG 24 hr capsule Take 1 capsule (180 mg total) by mouth daily. 90 capsule 3  . fenofibrate (TRICOR) 145 MG tablet TAKE 1 TABLET BY MOUTH EVERY DAY 90 tablet 3  . fexofenadine (ALLEGRA) 180 MG tablet Take 180 mg by mouth daily.    . fluvoxaMINE (LUVOX) 50 MG tablet Take 200 mg by mouth every morning.     . furosemide (LASIX) 20 MG tablet TAKE 1 TABLET BY MOUTH  DAILY AS NEEDED FOR FLUID  OR EDEMA 90 tablet 3  . latanoprost (XALATAN) 0.005 % ophthalmic solution Place 1 drop into both eyes at bedtime.     Marland Kitchen leuprolide, 6 Month, (ELIGARD) 45 MG injection Inject 45 mg into the skin every 6 (six) months.    . levothyroxine (SYNTHROID) 175 MCG tablet PT TAKES 1 TABLET BY MOUTH DAILY EXCEPT SUNDAYS    . lisinopril (ZESTRIL) 10 MG tablet Take 1 tablet by mouth daily.    Marland Kitchen MYRBETRIQ 50 MG TB24 tablet Take 50 mg by mouth daily.    . nitroGLYCERIN (NITROSTAT) 0.4 MG SL tablet PLACE 1 TABLET (0.4 MG TOTAL) UNDER THE TONGUE EVERY 5 (FIVE) MINUTES AS NEEDED FOR CHEST PAIN. 25 tablet 4  . pantoprazole (PROTONIX) 40 MG tablet Take 2 tablets (80 mg total) by mouth daily. 180 tablet 3  . rivaroxaban (XARELTO) 20 MG TABS tablet Take 1 tablet (20 mg total) by mouth daily with supper. 30 tablet 11  . rosuvastatin (CRESTOR) 40 MG tablet Take 1 tablet (40 mg total) by mouth daily. 90 tablet 3   No current facility-administered medications on file prior to visit.    Allergies  Allergen Reactions  . Tagamet [Cimetidine] Other (See Comments)    Unknown - over 30 years ago (2021) - unsure what occurred it may have been hives or flushing  . Atorvastatin      Myalgias on 80mg  dose  . Niacin And Related Other (See Comments)    Flushing      Assessment/Plan:  1. Hypertension - BP slightly above goal <130/73mmHg. Will stop diltiazem and start higher dose of Toprol 50mg  daily since beta blocker therapy prevoiusly worked better to control his palpitations. Will continue lisinopril 10mg  daily for now as his BP was close to goal in clinic and expect that Toprol will lower his readings slightly. Will call pt in 2 weeks and assess home BP readings - can increase lisinopril dose at that time if needed. Otherwise, encouraged pt to call clinic with any concerns before then.  2. Anticoagulation - Pt prefers to change back to Eliquis since this was ruled out as the cause of his itching. He actually resumed Eliquis at home already since it was taking the pharmacy too long to order his Xarelto. New rx has been sent to pharmacy.  Sherly Brodbeck E. Ciarra Braddy, PharmD, BCACP, [place] [address] [place], [place] [address] Phone: [phone]; Fax: [phone] 04/17/2020 7:45 AM

## 2020-04-29 ENCOUNTER — Telehealth: Payer: Self-pay | Admitting: Pharmacist

## 2020-04-29 MED ORDER — LISINOPRIL 20 MG PO TABS: 20.0000 mg | ORAL_TABLET | Freq: Every day | ORAL | 11 refills | Status: DC

## 2020-04-29 NOTE — Telephone Encounter (Signed)
Called pt to follow up with BP readings at home since starting Toprol a few weeks ago. Reports he's checked his BP 5-6x and they've been very high in the 180/90s. Also feels like his Toprol dose is still too low since he experiences palpitations 2-3x per day. Thinks his pacer is set at 60, HR at home ranges 65-75 usually.  Will increase lisinopril from 10 to 20mg  daily. Will increase Toprol from 50 to 75mg  daily. Pt has follow up visit with Dr Lovena Le in 2 weeks. Added BP appt for same day. Will need updated BMET at that visit. Also asked pt to bring home BP cuff to next visit too since his home readings have been trending much higher than his past few clinic readings.

## 2020-05-13 ENCOUNTER — Other Ambulatory Visit: Payer: Self-pay

## 2020-05-13 ENCOUNTER — Encounter: Payer: Self-pay | Admitting: Internal Medicine

## 2020-05-13 ENCOUNTER — Ambulatory Visit (INDEPENDENT_AMBULATORY_CARE_PROVIDER_SITE_OTHER): Payer: Medicare PPO | Admitting: Pharmacist

## 2020-05-13 ENCOUNTER — Ambulatory Visit (INDEPENDENT_AMBULATORY_CARE_PROVIDER_SITE_OTHER): Payer: Medicare PPO | Admitting: Internal Medicine

## 2020-05-13 VITALS — BP 174/98 | HR 67 | Ht 71.0 in | Wt 273.8 lb

## 2020-05-13 VITALS — BP 172/110 | HR 62

## 2020-05-13 DIAGNOSIS — Z950 Presence of cardiac pacemaker: Secondary | ICD-10-CM | POA: Diagnosis not present

## 2020-05-13 DIAGNOSIS — I2583 Coronary atherosclerosis due to lipid rich plaque: Secondary | ICD-10-CM | POA: Diagnosis not present

## 2020-05-13 DIAGNOSIS — I2510 Atherosclerotic heart disease of native coronary artery without angina pectoris: Secondary | ICD-10-CM | POA: Diagnosis not present

## 2020-05-13 DIAGNOSIS — I495 Sick sinus syndrome: Secondary | ICD-10-CM | POA: Diagnosis not present

## 2020-05-13 DIAGNOSIS — I10 Essential (primary) hypertension: Secondary | ICD-10-CM

## 2020-05-13 MED ORDER — CARVEDILOL 12.5 MG PO TABS
12.5000 mg | ORAL_TABLET | Freq: Two times a day (BID) | ORAL | 11 refills | Status: DC
Start: 2020-05-13 — End: 2020-06-03

## 2020-05-13 NOTE — Progress Notes (Signed)
Patient ID: Blake Carter                 DOB: 08/31/1950                      MRN: [id number]     HPI: Blake Carter is a 69 y.o. male referred by Dr. Radford Carter to HTN clinic. PMH is significant for hx of CAD (DES to LCx 05/2014 with residual LAD disease with repeat cath 02/2017 with patent stents and otherwise mild nonobstructive disease), HTN, dyslipidemia, sinus bradycardia, dilated aortic root (54mm by echo 10/2018), chronic diastolic CHF, allergic rhinits, GERD, hypothyroidism, hx of prostate cancer, hx of hydronephrosis of right kidney (s/p ureteral stenting), depression, and insomnia. Lipid meds have previously been adjusted (atorvastatin changed to rosuvastatin, gemfibrozil changed to fenofibrate due to prior myalgias, did not want injectable therapy or fish oil due to potential correlation with prostate cancer). Pt was previously followed in PharmD clinic for BP management earlier this year. At that time, his BP was well controlled on amlodipine 10mg  daily and lisinopril 10mg  daily. Home BP cuff calibrated accurately.   Echo 7/21 showed LVEF 60-65%, chest CT showed slight enlargement of ascending aortic aneurysm. Pt sent a MyChart message in August reporting elevated home SBP readings increased in the 130-140s. I increased his lisinopril to 20mg  daily. His recently started Wellbutrin was also likely contributing to elevated BP. BP and labs were stable at follow up visit with Dr Blake Carter 01/2020. He wore a Zio patch that detected afib and has since been started on Eliquis. He stopped his Wellbutrin and noted lower SBP in the 100s with accompanied dizziness. Lisinopril was decreased back to 10mg  daily. He was then started on Toprol 25mg  02/27/20 due to extra beats on his monitor. He experienced itching on Eliquis and was subsequently changed to Xarelto. His itching persisted and his metoprolol was stopped and changed to diltiazem. It sounds as though his itching stopped when he stopped his amlodipine,  although there seemed to be some confusion over what meds pt was taking (metoprolol vs diltiazem) based on 11/4 MyChart message.  At last PharmD visit on 11/18, pt wanted to change back to Eliquis since it was ruled out as the cause of his itching. Also changed diltiazem back to metoprolol which he felt controlled his tachycardia better with fewer side effects. I called pt 2 weeks later and pt reported elevated BP and continued palpitations 2-3x per day. His lisinopril was increased from 10 to 20mg  daily and his Toprol was increased from 50 to 75mg  daily.  Pt presents today in good spirits. Reports some improvement in his palpitations on higher dose of Toprol, about 90-95% controlled now. Pacemaker set to keep HR 60-120. Hasn't been needing furosemide (only takes prn for swelling in his hands). Hasn't been drinking as much Propel lately. No big changes in lifestyle, however BP readings have been much higher than over the past few months.   Home BP cuff reading: 167/98, HR 60 Clinic cuff reading: 172/110  Current HTN medications: lisinopril 20mg  daily (midnight),  Toprol 75mg  daily (lunchtime), furosemide 20mg  prn  Previously tried: amlodipine 10mg  - itching, diltiazem - felt more symptomatic, didn't control tachycardia as well  BP goal: <130/30mmHg  Family History: father (cancer (died from small lung cancer per pt report), heart disease ("multiple bypasses", "aorta replaced"), HLD); brother (CAD); "everyone on my dad's side had some kind of coronary disease / MI / heart failure"; grandmother (elevated TG)  Social History:  never used tobacco, occasional alcohol use (1x or 2x per month)  Diet:  Breakfast: eggs with cheese and ham on an english muffin Lunch: salad or sandwich (sometimes skips lunch) Dinner: protein with vegetables with a starch  Beverages: drinks mostly water or propel (2x per day in 1 32oz water) Does not use added salt on foods (Uses Mrs Deliah Boston instead if needed) Only goes out  to eat 1-2 times per week Has cut back on Propel  Exercise: walks 30 minutes to 1 hr 5-6 times per week, not as much lately due to heat  Home BP readings: 141/97, 156/89, 154/85, 136/92, 147/91, 143/100, 141/85, 175/105, 177/96, HR in the 60s, one spike to 107. Pacemaker settings adjusted today to 60-120  Wt Readings from Last 3 Encounters:  03/28/20 267 lb 12.8 oz (121.5 kg)  03/05/20 269 lb (122 kg)  01/30/20 269 lb 12.8 oz (122.4 kg)   BP Readings from Last 3 Encounters:  04/17/20 136/86  03/28/20 110/72  01/30/20 118/78   Pulse Readings from Last 3 Encounters:  04/17/20 67  03/28/20 70  01/30/20 68    Renal function: CrCl cannot be calculated (Patient's most recent lab result is older than the maximum 21 days allowed.).  Past Medical History:  Diagnosis Date  . Allergic rhinitis   . Arthritis    "a little bit in some of my joints" (06/20/2014)  . Ascending aorta dilatation (HCC)   . Chronic diastolic CHF (congestive heart failure) ([place])   . Coronary artery disease    a. 05/2014 Cath/PCI: LM nl, LAD 40p, 60-70d (FFR 0.80->3.0x24 Synergy DES), D2 40, LCX 90d (2.25x12 Synergy DES), RCA 20-42m, EF 60-65%. b. Cath 02/2017 without acute change, elev LVEDP suggestive of dCHF.  Marland Kitchen Depression   . Dilated aortic root (HCC)    aortic root at 14mm by Chest CTA 11/2019  . ED (erectile dysfunction)   . Environmental allergies    dry cough  . GERD (gastroesophageal reflux disease)   . History of hiatal hernia   . Hydronephrosis of right kidney    a. s/p ureteral stenting in the past.  . Hyperlipidemia    under control  . Hypertension   . Hypothyroidism   . OCD (obsessive compulsive disorder)   . Pacemaker   . PAF (paroxysmal atrial fibrillation) ([place])   . Panic disorder   . PAT (paroxysmal atrial tachycardia) ([place])   . PONV (postoperative nausea and vomiting)    after thyroid surgery no problems in 10 years   . Prostate cancer [facility])    prostate cancer- robotic prostatectomy  - followed by radiation because of positive lymph node--and pt on lupron injections  . PVC's (premature ventricular contractions)   . Sinus bradycardia    a. chronic, asymptomatic.  24 hour Holter 10/2016 showed Sinus bradycardia, sinus arrhythmia and normal sinus rhythm with average heart rate 50bpm and heart rate ranged from 32 to 100bpm.  . SUI (stress urinary incontinence), male    S/P ROBOTIC PROSTATECTOMY AND RADIATION TX    Current Outpatient Medications on File Prior to Visit  Medication Sig Dispense Refill  . acetaminophen (TYLENOL) 500 MG tablet Take 1,000 mg by mouth every 6 (six) hours as needed (pain).    Marland Kitchen ALPRAZolam (XANAX) 0.25 MG tablet Take 1 tablet (0.25 mg total) by mouth 2 (two) times daily as needed for anxiety. 60 tablet 0  . apixaban (ELIQUIS) 5 MG TABS tablet Take 1 tablet (5 mg total) by mouth 2 (two) times daily. [place]  tablet 1  . diazepam (VALIUM) 5 MG tablet Take 1 tablet (5 mg total) by mouth at bedtime. 30 tablet 2  . fenofibrate (TRICOR) 145 MG tablet TAKE 1 TABLET BY MOUTH EVERY DAY 90 tablet 3  . fexofenadine (ALLEGRA) 180 MG tablet Take 180 mg by mouth daily.    . fluvoxaMINE (LUVOX) 50 MG tablet Take 200 mg by mouth every morning.     . furosemide (LASIX) 20 MG tablet TAKE 1 TABLET BY MOUTH  DAILY AS NEEDED FOR FLUID OR EDEMA 90 tablet 0  . latanoprost (XALATAN) 0.005 % ophthalmic solution Place 1 drop into both eyes at bedtime.     Marland Kitchen leuprolide, 6 Month, (ELIGARD) 45 MG injection Inject 45 mg into the skin every 6 (six) months.    . levothyroxine (SYNTHROID) 175 MCG tablet PT TAKES 1 TABLET BY MOUTH DAILY EXCEPT SUNDAYS    . lisinopril (ZESTRIL) 20 MG tablet Take 1 tablet (20 mg total) by mouth daily. 30 tablet 11  . metoprolol succinate (TOPROL-XL) 50 MG 24 hr tablet Take 1.5 tablets (75 mg total) by mouth daily. Take with or immediately following a meal. 90 tablet 3  . MYRBETRIQ 50 MG TB24 tablet Take 50 mg by mouth daily.    . nitroGLYCERIN (NITROSTAT) 0.4  MG SL tablet PLACE 1 TABLET (0.4 MG TOTAL) UNDER THE TONGUE EVERY 5 (FIVE) MINUTES AS NEEDED FOR CHEST PAIN. 25 tablet 4  . pantoprazole (PROTONIX) 40 MG tablet Take 2 tablets (80 mg total) by mouth daily. 180 tablet 3  . rosuvastatin (CRESTOR) 40 MG tablet Take 1 tablet (40 mg total) by mouth daily. 90 tablet 3   No current facility-administered medications on file prior to visit.    Allergies  Allergen Reactions  . Tagamet [Cimetidine] Other (See Comments)    Unknown - over 30 years ago (2021) - unsure what occurred it may have been hives or flushing  . Atorvastatin     Myalgias on 80mg  dose  . Niacin And Related Other (See Comments)    Flushing      Assessment/Plan:  1. Hypertension - BP has been running higher over the past few weeks and is consistently above goal <130/87mmHg now. Pt is going out of town through Saturday and will take higher dose of Toprol 100mg  daily until he returns, then will stop Toprol and start carvedilol 12.5mg  BID for better BP control. Also checking BMET today with recent lisinopril dose increase. If labs are stable, will plan to start chlorthalidone 25mg  daily; pt rarely uses furosemide (only for occasional swelling in his hands). F/u in clinic in 3 weeks.  Chau Sawin E. Dalante Minus, PharmD, BCACP, [place] [address], [place], [place] [address] Phone: [phone]; Fax: [phone] 05/13/2020 8:58 AM

## 2020-05-13 NOTE — Patient Instructions (Signed)
Medication Instructions:  Your physician recommends that you continue on your current medications as directed. Please refer to the Current Medication list given to you today.  Labwork: None ordered.  Testing/Procedures: None ordered.  Follow-Up: Your physician wants you to follow-up in: one year with Dr. Lovena Le.   You will receive a reminder letter in the mail two months in advance. If you don't receive a letter, please call our office to schedule the follow-up appointment.  Remote monitoring is used to monitor your Pacemaker from home. This monitoring reduces the number of office visits required to check your device to one time per year. It allows [place] to keep an eye on the functioning of your device to ensure it is working properly. You are scheduled for a device check from home on 05/19/2020. You may send your transmission at any time that day. If you have a wireless device, the transmission will be sent automatically. After your physician reviews your transmission, you will receive a postcard with your next transmission date.  Any Other Special Instructions Will Be Listed Below (If Applicable).  If you need a refill on your cardiac medications before your next appointment, please call your pharmacy.

## 2020-05-13 NOTE — Progress Notes (Signed)
HPI Mr. Borowski returns today for followup. He is a pleasant 69 yo man with a h/o sinus node dysfunction, s/p PPM insertion. He underwent PPM insertion one year ago. He notes that he occaisionally feels like his heart beats hard. No clear provoking factors. He denies chest pain or sob. No edema.  Allergies  Allergen Reactions  . Tagamet [Cimetidine] Other (See Comments)    Unknown - over 30 years ago (2021) - unsure what occurred it may have been hives or flushing  . Atorvastatin     Myalgias on 80mg  dose  . Niacin And Related Other (See Comments)    Flushing      Current Outpatient Medications  Medication Sig Dispense Refill  . acetaminophen (TYLENOL) 500 MG tablet Take 1,000 mg by mouth every 6 (six) hours as needed (pain).    Marland Kitchen ALPRAZolam (XANAX) 0.25 MG tablet Take 1 tablet (0.25 mg total) by mouth 2 (two) times daily as needed for anxiety. 60 tablet 0  . apixaban (ELIQUIS) 5 MG TABS tablet Take 1 tablet (5 mg total) by mouth 2 (two) times daily. 180 tablet 1  . carvedilol (COREG) 12.5 MG tablet Take 1 tablet (12.5 mg total) by mouth 2 (two) times daily. 60 tablet 11  . diazepam (VALIUM) 5 MG tablet Take 1 tablet (5 mg total) by mouth at bedtime. 30 tablet 2  . fenofibrate (TRICOR) 145 MG tablet TAKE 1 TABLET BY MOUTH EVERY DAY 90 tablet 3  . fexofenadine (ALLEGRA) 180 MG tablet Take 180 mg by mouth daily.    . fluvoxaMINE (LUVOX) 50 MG tablet Take 200 mg by mouth every morning.     . furosemide (LASIX) 20 MG tablet TAKE 1 TABLET BY MOUTH  DAILY AS NEEDED FOR FLUID OR EDEMA 90 tablet 0  . latanoprost (XALATAN) 0.005 % ophthalmic solution Place 1 drop into both eyes at bedtime.     Marland Kitchen leuprolide, 6 Month, (ELIGARD) 45 MG injection Inject 45 mg into the skin every 6 (six) months.    . levothyroxine (SYNTHROID) 175 MCG tablet PT TAKES 1 TABLET BY MOUTH DAILY EXCEPT SUNDAYS    . lisinopril (ZESTRIL) 20 MG tablet Take 1 tablet (20 mg total) by mouth daily. 30 tablet 11  .  MYRBETRIQ 50 MG TB24 tablet Take 50 mg by mouth daily.    . nitroGLYCERIN (NITROSTAT) 0.4 MG SL tablet PLACE 1 TABLET (0.4 MG TOTAL) UNDER THE TONGUE EVERY 5 (FIVE) MINUTES AS NEEDED FOR CHEST PAIN. 25 tablet 4  . pantoprazole (PROTONIX) 40 MG tablet Take 2 tablets (80 mg total) by mouth daily. 180 tablet 3  . rosuvastatin (CRESTOR) 40 MG tablet Take 1 tablet (40 mg total) by mouth daily. 90 tablet 3   No current facility-administered medications for this visit.     Past Medical History:  Diagnosis Date  . Allergic rhinitis   . Arthritis    "a little bit in some of my joints" (06/20/2014)  . Ascending aorta dilatation (HCC)   . Chronic diastolic CHF (congestive heart failure) ([place])   . Coronary artery disease    a. 05/2014 Cath/PCI: LM nl, LAD 40p, 60-70d (FFR 0.80->3.0x24 Synergy DES), D2 40, LCX 90d (2.25x12 Synergy DES), RCA 20-80m, EF 60-65%. b. Cath 02/2017 without acute change, elev LVEDP suggestive of dCHF.  Marland Kitchen Depression   . Dilated aortic root (HCC)    aortic root at 24mm by Chest CTA 11/2019  . ED (erectile dysfunction)   . Environmental allergies  dry cough  . GERD (gastroesophageal reflux disease)   . History of hiatal hernia   . Hydronephrosis of right kidney    a. s/p ureteral stenting in the past.  . Hyperlipidemia    under control  . Hypertension   . Hypothyroidism   . OCD (obsessive compulsive disorder)   . Pacemaker   . PAF (paroxysmal atrial fibrillation) ([place])   . Panic disorder   . PAT (paroxysmal atrial tachycardia) ([place])   . PONV (postoperative nausea and vomiting)    after thyroid surgery no problems in 10 years   . Prostate cancer [facility])    prostate cancer- robotic prostatectomy - followed by radiation because of positive lymph node--and pt on lupron injections  . PVC's (premature ventricular contractions)   . Sinus bradycardia    a. chronic, asymptomatic.  24 hour Holter 10/2016 showed Sinus bradycardia, sinus arrhythmia and normal sinus rhythm with  average heart rate 50bpm and heart rate ranged from 32 to 100bpm.  . SUI (stress urinary incontinence), male    S/P ROBOTIC PROSTATECTOMY AND RADIATION TX    ROS:   All systems reviewed and negative except as noted in the HPI.   Past Surgical History:  Procedure Laterality Date  . COLONOSCOPY  07/2013   Dr. Oletta Lamas  . CORONARY ANGIOPLASTY WITH STENT PLACEMENT  06/20/2014   "2"  . CYSTOSCOPY  11/30/2011   Procedure: CYSTOSCOPY;  Surgeon: Reece Packer, MD;  Location: [facility];  Service: Urology;  Laterality: N/A;  Inplantation of Artificial Sphincter and Cystoscopy  . CYSTOSCOPY WITH RETROGRADE PYELOGRAM, URETEROSCOPY AND STENT PLACEMENT Right 02/11/2014   Procedure: CYSTOSCOPY WITH RETROGRADE PYELOGRAM, URETEROSCOPY ,URETERAL BIOPSY AND STENT PLACEMENT;  Surgeon: Raynelle Bring, MD;  Location: [facility];  Service: Urology;  Laterality: Right;  . CYSTOSCOPY WITH RETROGRADE PYELOGRAM, URETEROSCOPY AND STENT PLACEMENT Right 04/15/2014   Procedure: CYSTOSCOPY WITH RETROGRADE PYELOGRAM, URETEROSCOPY, BIOPSY AND STENT PLACEMENT;  Surgeon: Raynelle Bring, MD;  Location: [facility];  Service: Urology;  Laterality: Right;  . LEFT HEART CATH AND CORONARY ANGIOGRAPHY N/A 03/29/2017   Procedure: LEFT HEART CATH AND CORONARY ANGIOGRAPHY;  Surgeon: Leonie Man, MD;  Location: [place] CV LAB;  Service: Cardiovascular;  Laterality: N/A;  . LEFT HEART CATHETERIZATION WITH CORONARY ANGIOGRAM N/A 06/20/2014   Procedure: LEFT HEART CATHETERIZATION WITH CORONARY ANGIOGRAM;  Surgeon: Leonie Man, MD;  Location: [facility] CATH LAB;  Service: Cardiovascular;  Laterality: N/A;  . PACEMAKER IMPLANT N/A 05/21/2019   Procedure: PACEMAKER IMPLANT;  Surgeon: Evans Lance, MD;  Location: [place] CV LAB;  Service: Cardiovascular;  Laterality: N/A;  . PERCUTANEOUS CORONARY STENT INTERVENTION (PCI-S)  06/20/2014   Procedure: PERCUTANEOUS CORONARY STENT INTERVENTION (PCI-S);  Surgeon: Leonie Man, MD;  Location: [facility]  CATH LAB;  Service: Cardiovascular;;  LAD and Circumflex  . REFRACTIVE SURGERY Bilateral 2004  . ROBOT ASSISTED LAPAROSCOPIC RADICAL PROSTATECTOMY  05/2010  . THYROIDECTOMY, PARTIAL  10/1974  . TONSILLECTOMY  1956   . TOTAL THYROIDECTOMY  10/2012   "nuked it"  . UPPER GI ENDOSCOPY     food impaction 2009 done by Dr Oletta Lamas  . URINARY SPHINCTER IMPLANT  11/30/2011   Procedure: ARTIFICIAL URINARY SPHINCTER;  Surgeon: Reece Packer, MD;  Location: [facility];  Service: Urology;  Laterality: N/A;     Family History  Problem Relation Age of Onset  . Cancer Father   . Hyperlipidemia Father   . Heart disease Father        Father had CABG/aorta  repair by the time he was in his 81s  . CAD Brother        Brother who is 1 years younger has had some sort of stenting     Social History   Socioeconomic History  . Marital status: Married    Spouse name: Not on file  . Number of children: Not on file  . Years of education: Not on file  . Highest education level: Not on file  Occupational History  . Not on file  Tobacco Use  . Smoking status: Never Smoker  . Smokeless tobacco: Never Used  Vaping Use  . Vaping Use: Never used  Substance and Sexual Activity  . Alcohol use: Yes    Alcohol/week: 0.0 standard drinks    Comment: 06/20/2014 "1-2 drinks a couple times/month"  . Drug use: No  . Sexual activity: Yes  Other Topics Concern  . Not on file  Social History Narrative  . Not on file   Social Determinants of Health   Financial Resource Strain: Not on file  Food Insecurity: Not on file  Transportation Needs: Not on file  Physical Activity: Not on file  Stress: Not on file  Social Connections: Not on file  Intimate Partner Violence: Not on file     BP (!) 174/98   Pulse 67   Ht 5\' 11"  (1.803 m)   Wt 273 lb 12.8 oz (124.2 kg)   SpO2 92%   BMI 38.19 kg/m   Physical Exam:  Well appearing NAD HEENT: Unremarkable Neck:  No JVD, no thyromegally Lymphatics:  No  adenopathy Back:  No CVA tenderness Lungs:  Clear with no wheezes HEART:  Regular rate rhythm, no murmurs, no rubs, no clicks Abd:  soft, positive bowel sounds, no organomegally, no rebound, no guarding Ext:  2 plus pulses, no edema, no cyanosis, no clubbing Skin:  No rashes no nodules Neuro:  CN II through XII intact, motor grossly intact  EKG - nsr with atrial pacing  DEVICE  Normal device function.  See PaceArt for details.   Assess/Plan: 1. Sinus node dysfunction - he is asymptomatic, s/p PPM insertion. 2. PPM - we reprogrammed his device today turning CLS off out of concern that it was triggering his hard heart beats. 3. Palpitations - unclear if this is related to pacing or PVC's.  4. Obesity - he has gained 12 lbs since his last visit. He is encouraged to lose weight.  Carleene Overlie Blessed Girdner,MD

## 2020-05-13 NOTE — Patient Instructions (Addendum)
Check BMET today  Your blood pressure goal is < 130/29mmHg   Increase your metoprolol to 100mg  daily for the next few days. When you get back in town, stop your metoprolol and start carvedilol 12.5mg  - 1 tablet twice daily  Continue taking your lisinopril 20mg  daily  If your labs are stable today, we will either increase your lisinopril to 40mg  daily or add on chlorthalidone 25mg  - 1 tablet daily in the morning   Follow up in clinic 3 weeks for blood pressure check

## 2020-05-14 ENCOUNTER — Telehealth: Payer: Self-pay | Admitting: Pharmacist

## 2020-05-14 DIAGNOSIS — I10 Essential (primary) hypertension: Secondary | ICD-10-CM

## 2020-05-14 LAB — BASIC METABOLIC PANEL
BUN/Creatinine Ratio: 18 (ref 10–24)
BUN: 23 mg/dL (ref 8–27)
CO2: 25 mmol/L (ref 20–29)
Calcium: 9.7 mg/dL (ref 8.6–10.2)
Chloride: 104 mmol/L (ref 96–106)
Creatinine, Ser: 1.27 mg/dL (ref 0.76–1.27)
GFR calc Af Amer: 67 mL/min/{1.73_m2} (ref >59)
GFR calc non Af Amer: 58 mL/min/{1.73_m2} — ABNORMAL LOW (ref >59)
Glucose: 84 mg/dL (ref 65–99)
Potassium: 4.8 mmol/L (ref 3.5–5.2)
Sodium: 140 mmol/L (ref 134–144)

## 2020-05-14 MED ORDER — CHLORTHALIDONE 25 MG PO TABS: ORAL_TABLET | ORAL | 0 refills | Status: DC

## 2020-05-14 NOTE — Telephone Encounter (Signed)
Spoke with patient regarding bloodwork results.  Advised GFR decreased slightly and we would continue to monitor since we are starting new BP medication.  Pt requests chlorthalidone 90 day supply sent to [facility]

## 2020-05-16 LAB — CUP PACEART REMOTE DEVICE CHECK
Date Time Interrogation Session: 20211217075219
Implantable Lead Implant Date: 20201221
Implantable Lead Implant Date: 20201221
Implantable Lead Location: 753859
Implantable Lead Location: 753860
Implantable Lead Model: 377169
Implantable Lead Model: 377171
Implantable Lead Serial Number: 7000011949
Implantable Lead Serial Number: 81083606
Implantable Pulse Generator Implant Date: 20201221
Pulse Gen Model: 407145
Pulse Gen Serial Number: 69703841

## 2020-05-19 ENCOUNTER — Ambulatory Visit (INDEPENDENT_AMBULATORY_CARE_PROVIDER_SITE_OTHER): Payer: Medicare PPO

## 2020-05-19 DIAGNOSIS — I442 Atrioventricular block, complete: Secondary | ICD-10-CM

## 2020-05-21 DIAGNOSIS — E890 Postprocedural hypothyroidism: Secondary | ICD-10-CM | POA: Diagnosis not present

## 2020-05-29 NOTE — Progress Notes (Signed)
Remote pacemaker transmission.   

## 2020-05-30 ENCOUNTER — Other Ambulatory Visit: Payer: Self-pay | Admitting: Cardiology

## 2020-05-30 DIAGNOSIS — E7800 Pure hypercholesterolemia, unspecified: Secondary | ICD-10-CM

## 2020-06-03 ENCOUNTER — Ambulatory Visit (INDEPENDENT_AMBULATORY_CARE_PROVIDER_SITE_OTHER): Payer: Medicare PPO | Admitting: Pharmacist

## 2020-06-03 VITALS — BP 102/64 | HR 66

## 2020-06-03 DIAGNOSIS — I10 Essential (primary) hypertension: Secondary | ICD-10-CM | POA: Diagnosis not present

## 2020-06-03 LAB — BASIC METABOLIC PANEL
BUN/Creatinine Ratio: 21 (ref 10–24)
BUN: 32 mg/dL — ABNORMAL HIGH (ref 8–27)
CO2: 24 mmol/L (ref 20–29)
Calcium: 10.1 mg/dL (ref 8.6–10.2)
Chloride: 96 mmol/L (ref 96–106)
Creatinine, Ser: 1.54 mg/dL — ABNORMAL HIGH (ref 0.76–1.27)
GFR calc Af Amer: 53 mL/min/{1.73_m2} — ABNORMAL LOW (ref >59)
GFR calc non Af Amer: 46 mL/min/{1.73_m2} — ABNORMAL LOW (ref >59)
Glucose: 100 mg/dL — ABNORMAL HIGH (ref 65–99)
Potassium: 4.3 mmol/L (ref 3.5–5.2)
Sodium: 138 mmol/L (ref 134–144)

## 2020-06-03 MED ORDER — METOPROLOL SUCCINATE ER 100 MG PO TB24
100.0000 mg | ORAL_TABLET | Freq: Every day | ORAL | 3 refills | Status: DC
Start: 2020-06-03 — End: 2021-02-06

## 2020-06-03 NOTE — Patient Instructions (Addendum)
It was nice to see you today  Your blood pressure has been looking excellent at home  STOP taking carvedilol  START taking metoprolol succinate 100mg  once daily  Continue taking your other medications including chlorthalidone and lisinopril  Monitor your blood pressure at home

## 2020-06-03 NOTE — Progress Notes (Signed)
Patient ID: Blake Carter                 DOB: Jun 13, 1950                      MRN: [id number]     HPI: Blake Carter is a 70 y.o. male referred by Dr. Radford Carter to HTN clinic. PMH is significant for hx of CAD (DES to LCx 05/2014 with residual LAD disease with repeat cath 02/2017 with patent stents and otherwise mild nonobstructive disease), HTN, dyslipidemia, sinus bradycardia, dilated aortic root (52mm by echo 10/2018), chronic diastolic CHF, allergic rhinits, GERD, hypothyroidism, hx of prostate cancer, hx of hydronephrosis of right kidney (s/p ureteral stenting), depression, and insomnia. Lipid meds have previously been adjusted (atorvastatin changed to rosuvastatin, gemfibrozil changed to fenofibrate due to prior myalgias, did not want injectable therapy or fish oil due to potential correlation with prostate cancer). Pt was previously followed in PharmD clinic for BP management earlier this year. At that time, his BP was well controlled on amlodipine 10mg  daily and lisinopril 10mg  daily. Home BP cuff calibrated accurately.   Echo 7/21 showed LVEF 60-65%, chest CT showed slight enlargement of ascending aortic aneurysm. He wore a Zio patch that detected afib and has since been started on Eliquis. Previously took bupropion which increased his BP. He experienced itching on Eliquis and was subsequently changed to Xarelto. His itching persisted and his metoprolol was stopped and changed to diltiazem. Itching ended up being due to amlodipine and pt wished to change back to Eliquis. Beta blockers have worked better to control his tachycardia/palpitations than diltiazem did. Pacemaker set to keep HR 60-120. At last PharmD visit, BP was elevated at 172/110 so metoprolol was changed to carvedilol for better BP lowering effects and chlorthalidone was started.  Pt presents today in good spirits. Reports tolerating his medications well but feels that Toprol controlled his palpitations better than carvedilol is.  Notes palpitations about 3-5x per day. He's tolerating his chlorthalidone well, has not needed any prn Lasix. Does feel like he's had a bit less energy when he exerts himself. Took his dog for a 1.5 mile walk the other day due to fatigue, typically walks about 3 miles.   Current HTN medications:  Lisinopril 20mg  daily (morning) Carvedilol 12.5mg  BID (12pm and 12am) Chlorthalidone 25mg  daily (morning) furosemide 20mg  prn  Previously tried:  Amlodipine 10mg  - itching Diltiazem - felt more symptomatic, didn't control tachycardia as well  BP goal: <130/96mmHg  Family History: father (cancer (died from small lung cancer per pt report), heart disease ("multiple bypasses", "aorta replaced"), HLD); brother (CAD); "everyone on my dad's side had some kind of coronary disease / MI / heart failure"; grandmother (elevated TG)  Social History: never used tobacco, occasional alcohol use (1x or 2x per month)  Diet:  Breakfast: eggs with cheese and ham on an english muffin Lunch: salad or sandwich (sometimes skips lunch) Dinner: protein with vegetables with a starch  Beverages: drinks mostly water or propel (2x per day in 1 32oz water) Does not use added salt on foods (Uses Mrs Deliah Boston instead if needed) Only goes out to eat 1-2 times per week Has cut back on Propel  Exercise: walks 30 minutes to 1 hr 5-6 times per week, not as much lately due to heat  Home BP readings: 142/76, 144/83, 136/79, 134/79, 124/76, 125/80, 113/75, 109/73, HR 60-70s  Wt Readings from Last 3 Encounters:  05/13/20 273 lb 12.8 oz (124.2 kg)  03/28/20 267 lb 12.8 oz (121.5 kg)  03/05/20 269 lb (122 kg)   BP Readings from Last 3 Encounters:  05/13/20 (!) 172/110  05/13/20 (!) 174/98  04/17/20 136/86   Pulse Readings from Last 3 Encounters:  05/13/20 62  05/13/20 67  04/17/20 67    Renal function: CrCl cannot be calculated (Unknown ideal weight.).  Past Medical History:  Diagnosis Date  . Allergic rhinitis   .  Arthritis    "a little bit in some of my joints" (06/20/2014)  . Ascending aorta dilatation (HCC)   . Chronic diastolic CHF (congestive heart failure) ([place])   . Coronary artery disease    a. 05/2014 Cath/PCI: LM nl, LAD 40p, 60-70d (FFR 0.80->3.0x24 Synergy DES), D2 40, LCX 90d (2.25x12 Synergy DES), RCA 20-24m, EF 60-65%. b. Cath 02/2017 without acute change, elev LVEDP suggestive of dCHF.  Marland Kitchen Depression   . Dilated aortic root (HCC)    aortic root at 35mm by Chest CTA 11/2019  . ED (erectile dysfunction)   . Environmental allergies    dry cough  . GERD (gastroesophageal reflux disease)   . History of hiatal hernia   . Hydronephrosis of right kidney    a. s/p ureteral stenting in the past.  . Hyperlipidemia    under control  . Hypertension   . Hypothyroidism   . OCD (obsessive compulsive disorder)   . Pacemaker   . PAF (paroxysmal atrial fibrillation) ([place])   . Panic disorder   . PAT (paroxysmal atrial tachycardia) ([place])   . PONV (postoperative nausea and vomiting)    after thyroid surgery no problems in 10 years   . Prostate cancer [facility])    prostate cancer- robotic prostatectomy - followed by radiation because of positive lymph node--and pt on lupron injections  . PVC's (premature ventricular contractions)   . Sinus bradycardia    a. chronic, asymptomatic.  24 hour Holter 10/2016 showed Sinus bradycardia, sinus arrhythmia and normal sinus rhythm with average heart rate 50bpm and heart rate ranged from 32 to 100bpm.  . SUI (stress urinary incontinence), male    S/P ROBOTIC PROSTATECTOMY AND RADIATION TX    Current Outpatient Medications on File Prior to Visit  Medication Sig Dispense Refill  . acetaminophen (TYLENOL) 500 MG tablet Take 1,000 mg by mouth every 6 (six) hours as needed (pain).    Marland Kitchen ALPRAZolam (XANAX) 0.25 MG tablet Take 1 tablet (0.25 mg total) by mouth 2 (two) times daily as needed for anxiety. 60 tablet 0  . apixaban (ELIQUIS) 5 MG TABS tablet Take 1 tablet (5 mg  total) by mouth 2 (two) times daily. 180 tablet 1  . carvedilol (COREG) 12.5 MG tablet Take 1 tablet (12.5 mg total) by mouth 2 (two) times daily. 60 tablet 11  . chlorthalidone (HYGROTON) 25 MG tablet Take 1 tablet (25 mg) by mouth every morning 90 tablet 0  . diazepam (VALIUM) 5 MG tablet Take 1 tablet (5 mg total) by mouth at bedtime. 30 tablet 2  . fenofibrate (TRICOR) 145 MG tablet TAKE 1 TABLET BY MOUTH EVERY DAY 90 tablet 3  . fexofenadine (ALLEGRA) 180 MG tablet Take 180 mg by mouth daily.    . fluvoxaMINE (LUVOX) 50 MG tablet Take 200 mg by mouth every morning.     . furosemide (LASIX) 20 MG tablet TAKE 1 TABLET BY MOUTH  DAILY AS NEEDED FOR FLUID OR EDEMA 90 tablet 0  . latanoprost (XALATAN) 0.005 % ophthalmic solution Place 1 drop into both eyes  at bedtime.     Marland Kitchen leuprolide, 6 Month, (ELIGARD) 45 MG injection Inject 45 mg into the skin every 6 (six) months.    . levothyroxine (SYNTHROID) 175 MCG tablet PT TAKES 1 TABLET BY MOUTH DAILY EXCEPT SUNDAYS    . lisinopril (ZESTRIL) 20 MG tablet Take 1 tablet (20 mg total) by mouth daily. 30 tablet 11  . MYRBETRIQ 50 MG TB24 tablet Take 50 mg by mouth daily.    . nitroGLYCERIN (NITROSTAT) 0.4 MG SL tablet PLACE 1 TABLET (0.4 MG TOTAL) UNDER THE TONGUE EVERY 5 (FIVE) MINUTES AS NEEDED FOR CHEST PAIN. 25 tablet 4  . pantoprazole (PROTONIX) 40 MG tablet Take 2 tablets (80 mg total) by mouth daily. 180 tablet 3  . rosuvastatin (CRESTOR) 40 MG tablet TAKE 1 TABLET BY MOUTH EVERY DAY 90 tablet 3   No current facility-administered medications on file prior to visit.    Allergies  Allergen Reactions  . Tagamet [Cimetidine] Other (See Comments)    Unknown - over 30 years ago (2021) - unsure what occurred it may have been hives or flushing  . Atorvastatin     Myalgias on 80mg  dose  . Niacin And Related Other (See Comments)    Flushing      Assessment/Plan:  1. Hypertension - BP much improved and now consistently at goal <130/84mmHg at home  and in clinic. Will change carvedilol back to Toprol 100mg  daily since pt felt that Toprol controlled his palpitations better. Will continue chlorthalidone 25mg  daily and lisinopril 20mg  daily. Checking BMET today with recent chlorthalidone start. Pt sees Dr 91m in 1 month for follow up, follow up with me in 2 months per pt preference.  Blake Carter, PharmD, BCACP, CPP [facility] [address] [place], [place] Phone: [phone]; Fax: [phone] 06/03/2020 7:38 AM

## 2020-06-04 ENCOUNTER — Telehealth: Payer: Self-pay | Admitting: Pharmacist

## 2020-06-04 DIAGNOSIS — I10 Essential (primary) hypertension: Secondary | ICD-10-CM

## 2020-06-04 NOTE — Telephone Encounter (Signed)
Spoke with pt, will decrease chlorthalidone down to 12.5mg  daily. He will try to increase his water intake. Already avoiding NSAIDs. Will recheck BMET in 10 days and I'll call pt to follow up with home BP readings at that time.

## 2020-06-06 ENCOUNTER — Inpatient Hospital Stay: Admission: RE | Admit: 2020-06-06 | Payer: Medicare PPO | Source: Ambulatory Visit

## 2020-06-10 DIAGNOSIS — I10 Essential (primary) hypertension: Secondary | ICD-10-CM

## 2020-06-12 ENCOUNTER — Other Ambulatory Visit: Payer: Medicare PPO | Admitting: *Deleted

## 2020-06-12 ENCOUNTER — Other Ambulatory Visit: Payer: Self-pay

## 2020-06-12 DIAGNOSIS — I10 Essential (primary) hypertension: Secondary | ICD-10-CM

## 2020-06-13 LAB — BASIC METABOLIC PANEL
BUN/Creatinine Ratio: 20 (ref 10–24)
BUN: 24 mg/dL (ref 8–27)
CO2: 24 mmol/L (ref 20–29)
Calcium: 9.8 mg/dL (ref 8.6–10.2)
Chloride: 102 mmol/L (ref 96–106)
Creatinine, Ser: 1.21 mg/dL (ref 0.76–1.27)
GFR calc Af Amer: 71 mL/min/{1.73_m2} (ref >59)
GFR calc non Af Amer: 61 mL/min/{1.73_m2} (ref >59)
Glucose: 100 mg/dL — ABNORMAL HIGH (ref 65–99)
Potassium: 4.4 mmol/L (ref 3.5–5.2)
Sodium: 141 mmol/L (ref 134–144)

## 2020-06-13 MED ORDER — LISINOPRIL 40 MG PO TABS
40.0000 mg | ORAL_TABLET | Freq: Every day | ORAL | 3 refills | Status: DC
Start: 2020-06-13 — End: 2020-07-22

## 2020-06-16 ENCOUNTER — Other Ambulatory Visit: Payer: Medicare PPO

## 2020-06-18 ENCOUNTER — Other Ambulatory Visit: Payer: Self-pay | Admitting: Pharmacist

## 2020-06-18 DIAGNOSIS — I10 Essential (primary) hypertension: Secondary | ICD-10-CM

## 2020-06-18 MED ORDER — CHLORTHALIDONE 25 MG PO TABS: 12.5000 mg | ORAL_TABLET | Freq: Every day | ORAL | 3 refills | Status: DC

## 2020-06-19 DIAGNOSIS — C61 Malignant neoplasm of prostate: Secondary | ICD-10-CM | POA: Diagnosis not present

## 2020-06-25 ENCOUNTER — Encounter: Payer: Self-pay | Admitting: Cardiology

## 2020-06-25 ENCOUNTER — Telehealth: Payer: Self-pay

## 2020-06-25 ENCOUNTER — Ambulatory Visit (INDEPENDENT_AMBULATORY_CARE_PROVIDER_SITE_OTHER)
Admission: RE | Admit: 2020-06-25 | Discharge: 2020-06-25 | Disposition: A | Discharge status: Home / Self Care | Payer: Medicare PPO | Source: Ambulatory Visit | Attending: Cardiology | Admitting: Cardiology

## 2020-06-25 ENCOUNTER — Other Ambulatory Visit: Payer: Self-pay

## 2020-06-25 ENCOUNTER — Other Ambulatory Visit: Payer: Medicare PPO | Admitting: *Deleted

## 2020-06-25 DIAGNOSIS — I10 Essential (primary) hypertension: Secondary | ICD-10-CM | POA: Diagnosis not present

## 2020-06-25 DIAGNOSIS — I700 Atherosclerosis of aorta: Secondary | ICD-10-CM | POA: Insufficient documentation

## 2020-06-25 DIAGNOSIS — R911 Solitary pulmonary nodule: Secondary | ICD-10-CM | POA: Diagnosis not present

## 2020-06-25 DIAGNOSIS — K449 Diaphragmatic hernia without obstruction or gangrene: Secondary | ICD-10-CM | POA: Diagnosis not present

## 2020-06-25 DIAGNOSIS — I712 Thoracic aortic aneurysm, without rupture, unspecified: Secondary | ICD-10-CM

## 2020-06-25 DIAGNOSIS — J9811 Atelectasis: Secondary | ICD-10-CM | POA: Diagnosis not present

## 2020-06-25 DIAGNOSIS — K8020 Calculus of gallbladder without cholecystitis without obstruction: Secondary | ICD-10-CM | POA: Diagnosis not present

## 2020-06-25 LAB — BASIC METABOLIC PANEL
BUN/Creatinine Ratio: 26 — ABNORMAL HIGH (ref 10–24)
BUN: 33 mg/dL — ABNORMAL HIGH (ref 8–27)
CO2: 26 mmol/L (ref 20–29)
Calcium: 9.9 mg/dL (ref 8.6–10.2)
Chloride: 99 mmol/L (ref 96–106)
Creatinine, Ser: 1.29 mg/dL — ABNORMAL HIGH (ref 0.76–1.27)
GFR calc Af Amer: 65 mL/min/{1.73_m2} (ref >59)
GFR calc non Af Amer: 57 mL/min/{1.73_m2} — ABNORMAL LOW (ref >59)
Glucose: 103 mg/dL — ABNORMAL HIGH (ref 65–99)
Potassium: 4.9 mmol/L (ref 3.5–5.2)
Sodium: 138 mmol/L (ref 134–144)

## 2020-06-25 MED ORDER — IOHEXOL 350 MG/ML SOLN
75.0000 mL | Freq: Once | INTRAVENOUS | Status: AC | PRN
  Administered 2020-06-25: 75 mL via INTRAVENOUS

## 2020-06-25 NOTE — Telephone Encounter (Signed)
  Patient Consent for Virtual Visit         Blake Carter has provided verbal consent on 06/25/2020 for a virtual visit (video or telephone).   CONSENT FOR VIRTUAL VISIT FOR:  Blake Carter  By participating in this virtual visit I agree to the following:  I hereby voluntarily request, consent and authorize [facility] and its employed or contracted physicians, physician assistants, nurse practitioners or other licensed health care professionals (the Practitioner), to provide me with telemedicine health care services (the "Services") as deemed necessary by the treating Practitioner. I acknowledge and consent to receive the Services by the Practitioner via telemedicine. I understand that the telemedicine visit will involve communicating with the Practitioner through live audiovisual communication technology and the disclosure of certain medical information by electronic transmission. I acknowledge that I have been given the opportunity to request an in-person assessment or other available alternative prior to the telemedicine visit and am voluntarily participating in the telemedicine visit.  I understand that I have the right to withhold or withdraw my consent to the use of telemedicine in the course of my care at any time, without affecting my right to future care or treatment, and that the Practitioner or I may terminate the telemedicine visit at any time. I understand that I have the right to inspect all information obtained and/or recorded in the course of the telemedicine visit and may receive copies of available information for a reasonable fee.  I understand that some of the potential risks of receiving the Services via telemedicine include:  Marland Kitchen Delay or interruption in medical evaluation due to technological equipment failure or disruption; . Information transmitted may not be sufficient (e.g. poor resolution of images) to allow for appropriate medical decision making by the  Practitioner; and/or  . In rare instances, security protocols could fail, causing a breach of personal health information.  Furthermore, I acknowledge that it is my responsibility to provide information about my medical history, conditions and care that is complete and accurate to the best of my ability. I acknowledge that Practitioner's advice, recommendations, and/or decision may be based on factors not within their control, such as incomplete or inaccurate data provided by me or distortions of diagnostic images or specimens that may result from electronic transmissions. I understand that the practice of medicine is not an exact science and that Practitioner makes no warranties or guarantees regarding treatment outcomes. I acknowledge that a copy of this consent can be made available to me via my patient portal ([facility]), or I can request a printed copy by calling the office of [facility].    I understand that my insurance will be billed for this visit.   I have read or had this consent read to me. . I understand the contents of this consent, which adequately explains the benefits and risks of the Services being provided via telemedicine.  . I have been provided ample opportunity to ask questions regarding this consent and the Services and have had my questions answered to my satisfaction. . I give my informed consent for the services to be provided through the use of telemedicine in my medical care

## 2020-06-27 ENCOUNTER — Other Ambulatory Visit: Payer: Medicare PPO

## 2020-06-30 ENCOUNTER — Other Ambulatory Visit: Payer: Medicare PPO

## 2020-07-01 ENCOUNTER — Telehealth (INDEPENDENT_AMBULATORY_CARE_PROVIDER_SITE_OTHER): Payer: Medicare PPO | Admitting: Cardiology

## 2020-07-01 ENCOUNTER — Encounter: Payer: Self-pay | Admitting: Cardiology

## 2020-07-01 ENCOUNTER — Other Ambulatory Visit: Payer: Self-pay

## 2020-07-01 VITALS — BP 111/67 | HR 67 | Ht 71.0 in | Wt 256.0 lb

## 2020-07-01 DIAGNOSIS — I2583 Coronary atherosclerosis due to lipid rich plaque: Secondary | ICD-10-CM | POA: Diagnosis not present

## 2020-07-01 DIAGNOSIS — E7800 Pure hypercholesterolemia, unspecified: Secondary | ICD-10-CM | POA: Diagnosis not present

## 2020-07-01 DIAGNOSIS — I5032 Chronic diastolic (congestive) heart failure: Secondary | ICD-10-CM | POA: Diagnosis not present

## 2020-07-01 DIAGNOSIS — I2510 Atherosclerotic heart disease of native coronary artery without angina pectoris: Secondary | ICD-10-CM

## 2020-07-01 DIAGNOSIS — I480 Paroxysmal atrial fibrillation: Secondary | ICD-10-CM | POA: Diagnosis not present

## 2020-07-01 DIAGNOSIS — I495 Sick sinus syndrome: Secondary | ICD-10-CM

## 2020-07-01 DIAGNOSIS — I10 Essential (primary) hypertension: Secondary | ICD-10-CM

## 2020-07-01 DIAGNOSIS — I77819 Aortic ectasia, unspecified site: Secondary | ICD-10-CM | POA: Diagnosis not present

## 2020-07-01 DIAGNOSIS — I77810 Thoracic aortic ectasia: Secondary | ICD-10-CM

## 2020-07-01 NOTE — Addendum Note (Signed)
Addended by: Antonieta Iba on: 07/01/2020 10:12 AM   Modules accepted: Orders

## 2020-07-01 NOTE — H&P (View-Only) (Signed)
Virtual Visit via Video Note   This visit type was conducted due to national recommendations for restrictions regarding the COVID-19 Pandemic (e.g. social distancing) in an effort to limit this patient's exposure and mitigate transmission in our community.  Due to his co-morbid illnesses, this patient is at least at moderate risk for complications without adequate follow up.  This format is felt to be most appropriate for this patient at this time.  All issues noted in this document were discussed and addressed.  A limited physical exam was performed with this format.  Please refer to the patient's chart for his consent to telehealth for [facility].       Patient Location: Home Provider Location: Home Office    Cardiology Office Note    Date:  07/01/2020   ID:  Blake Carter, DOB 07/13/1950, MRN [id number]  PCP:  Hulan Fess, MD  Cardiologist:  Fransico Him, MD  Electrophysiologist:  Cristopher Peru, MD   Chief Complaint: f/u PAF, CAD< HTN, HLD  History of Present Illness:   Blake Carter is a 70 y.o. 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  Last cath was in 2018 showing patent stents, moderately elevated LVEDP. He underwent pacemaker implantation in 2020 for progressive bradycardia. Last echo 11/2019 EF 60-65%, grade 1 DD, mild AI (normal valve structure), moderate dilation of ascending aorta (85mm) with CTA showing 4.4cm.  Was seen in the office 01/30/20 with episodic palpitations associated with SOB and chest discomfort. He wore a monitor showing NSR/ST average HR 67bpm, nonsustained atrial tachycardia, intermittent V pacing, occsaional PVCs,  bigeminal PVCs, trigeminal PVCs and ventricular couplets and triplets, and wide complex tachycardia up to 5 beats. He was started on metoprolol. Nuclear stress test was normal. It does appear that on pacemaker interrogation on 01/26/20 the paitent had actually had an episode of atrial fibrillation (prior to event monitor) therefore he was started on anticoagulation. He had itching with Eliquis so was changed to Xarelto.  He is here today for followup and is haiving a lot of problems with DOE and chest tightness with exertion.  He used to walk 4 miles daily with no problems and now cannot go out and walk his dog a short distance or climbing a flight of stairs due to chest tightness and DOE which are very reminiscent of his prior angina.  He has no rest pain.  He occasionally has some diaphoresis but he is not sure if it is from the hormones he is on.  There is on radiation of the pain and no nausea.  The pain only lasts a few seconds and then resolves with rest.     Past Medical History:  Diagnosis Date  . Allergic rhinitis   . Aortic atherosclerosis ([place])   . Arthritis    "a little bit in some of my joints" (06/20/2014)  . Ascending aorta dilatation (HCC)    59mm by chest CTA 05/2020  . Chronic diastolic CHF (congestive heart failure) ([place])   . Coronary artery disease    a. 05/2014 Cath/PCI: LM nl, LAD 40p, 60-70d (FFR 0.80->3.0x24 Synergy DES), D2 40, LCX 90d (2.25x12 Synergy DES), RCA 20-57m, EF 60-65%. b. Cath 02/2017 without acute change, elev LVEDP suggestive of dCHF.  Marland Kitchen  Depression   . Dilated aortic root (HCC)    aortic root at 50mm by Chest CTA 11/2019  . ED (erectile dysfunction)   . Environmental allergies    dry cough  . GERD (gastroesophageal reflux disease)   . History of hiatal hernia   . Hydronephrosis of right kidney    a. s/p ureteral stenting in the past.  . Hyperlipidemia    under control  . Hypertension   . Hypothyroidism   . OCD (obsessive compulsive disorder)   .  Pacemaker   . PAF (paroxysmal atrial fibrillation) ([place])   . Panic disorder   . PAT (paroxysmal atrial tachycardia) ([place])   . PONV (postoperative nausea and vomiting)    after thyroid surgery no problems in 10 years   . Prostate cancer [facility])    prostate cancer- robotic prostatectomy - followed by radiation because of positive lymph node--and pt on lupron injections  . PVC's (premature ventricular contractions)   . Sinus bradycardia    a. chronic, asymptomatic.  24 hour Holter 10/2016 showed Sinus bradycardia, sinus arrhythmia and normal sinus rhythm with average heart rate 50bpm and heart rate ranged from 32 to 100bpm.  . SUI (stress urinary incontinence), male    S/P ROBOTIC PROSTATECTOMY AND RADIATION TX    Past Surgical History:  Procedure Laterality Date  . COLONOSCOPY  07/2013   Dr. Oletta Lamas  . CORONARY ANGIOPLASTY WITH STENT PLACEMENT  06/20/2014   "2"  . CYSTOSCOPY  11/30/2011   Procedure: CYSTOSCOPY;  Surgeon: Reece Packer, MD;  Location: [facility];  Service: Urology;  Laterality: N/A;  Inplantation of Artificial Sphincter and Cystoscopy  . CYSTOSCOPY WITH RETROGRADE PYELOGRAM, URETEROSCOPY AND STENT PLACEMENT Right 02/11/2014   Procedure: CYSTOSCOPY WITH RETROGRADE PYELOGRAM, URETEROSCOPY ,URETERAL BIOPSY AND STENT PLACEMENT;  Surgeon: Raynelle Bring, MD;  Location: [facility];  Service: Urology;  Laterality: Right;  . CYSTOSCOPY WITH RETROGRADE PYELOGRAM, URETEROSCOPY AND STENT PLACEMENT Right 04/15/2014   Procedure: CYSTOSCOPY WITH RETROGRADE PYELOGRAM, URETEROSCOPY, BIOPSY AND STENT PLACEMENT;  Surgeon: Raynelle Bring, MD;  Location: [facility];  Service: Urology;  Laterality: Right;  . LEFT HEART CATH AND CORONARY ANGIOGRAPHY N/A 03/29/2017   Procedure: LEFT HEART CATH AND CORONARY ANGIOGRAPHY;  Surgeon: Leonie Man, MD;  Location: [place] [facility];  Service: Cardiovascular;  Laterality: N/A;  . LEFT HEART CATHETERIZATION WITH CORONARY ANGIOGRAM N/A 06/20/2014   Procedure: LEFT  HEART CATHETERIZATION WITH CORONARY ANGIOGRAM;  Surgeon: Leonie Man, MD;  Location: [facility] CATH LAB;  Service: Cardiovascular;  Laterality: N/A;  . PACEMAKER IMPLANT N/A 05/21/2019   Procedure: PACEMAKER IMPLANT;  Surgeon: Evans Lance, MD;  Location: [place] [facility];  Service: Cardiovascular;  Laterality: N/A;  . PERCUTANEOUS CORONARY STENT INTERVENTION (PCI-S)  06/20/2014   Procedure: PERCUTANEOUS CORONARY STENT INTERVENTION (PCI-S);  Surgeon: Leonie Man, MD;  Location: [facility] CATH LAB;  Service: Cardiovascular;;  LAD and Circumflex  . REFRACTIVE SURGERY Bilateral 2004  . ROBOT ASSISTED LAPAROSCOPIC RADICAL PROSTATECTOMY  05/2010  . THYROIDECTOMY, PARTIAL  10/1974  . TONSILLECTOMY  1956   . TOTAL THYROIDECTOMY  10/2012   "nuked it"  . UPPER GI ENDOSCOPY     food impaction 2009 done by Dr Oletta Lamas  . URINARY SPHINCTER IMPLANT  11/30/2011   Procedure: ARTIFICIAL URINARY SPHINCTER;  Surgeon: Reece Packer, MD;  Location: [facility];  Service: Urology;  Laterality: N/A;    Current Medications: Current Meds  Medication Sig  . acetaminophen (TYLENOL) 500 MG tablet Take 1,000 mg by  mouth every 6 (six) hours as needed (pain).  . Cholecalciferol (VITAMIN D3) 2000 units TABS Take 6,000 Units by mouth 3 (three) times a week.   . diazepam (VALIUM) 5 MG tablet Take 1 tablet (5 mg total) by mouth at bedtime.  . fenofibrate (TRICOR) 145 MG tablet Take 1 tablet (145 mg total) by mouth daily.  . fexofenadine (ALLEGRA) 180 MG tablet Take 180 mg by mouth daily.  . fluvoxaMINE (LUVOX) 50 MG tablet Take 200 mg by mouth every morning.   . furosemide (LASIX) 20 MG tablet TAKE 1 TABLET BY MOUTH  DAILY AS NEEDED FOR FLUID  OR EDEMA  . latanoprost (XALATAN) 0.005 % ophthalmic solution Place 1 drop into both eyes at bedtime.   Marland Kitchen leuprolide, 6 Month, (ELIGARD) 45 MG injection Inject 45 mg into the skin every 6 (six) months.  . levothyroxine (SYNTHROID) 175 MCG tablet PT TAKES 1 TABLET BY MOUTH DAILY EXCEPT  SUNDAYS  . lisinopril (ZESTRIL) 10 MG tablet Take 1 tablet by mouth daily.  Marland Kitchen MYRBETRIQ 50 MG TB24 tablet Take 50 mg by mouth daily.  . nitroGLYCERIN (NITROSTAT) 0.4 MG SL tablet PLACE 1 TABLET (0.4 MG TOTAL) UNDER THE TONGUE EVERY 5 (FIVE) MINUTES AS NEEDED FOR CHEST PAIN.  Marland Kitchen pantoprazole (PROTONIX) 40 MG tablet Take 2 tablets (80 mg total) by mouth daily.  . rivaroxaban (XARELTO) 20 MG TABS tablet Take 1 tablet (20 mg total) by mouth daily with supper.  Marland Kitchen amLODipine (NORVASC) 10 MG tablet Take 1 tablet (10 mg total) by mouth every evening.  . metoprolol succinate (TOPROL XL) 25 MG 24 hr tablet Take 1 tablet (25 mg total) by mouth daily.        Allergies:   Tagamet [cimetidine], Atorvastatin, and Niacin and related   Social History   Socioeconomic History  . Marital status: Married    Spouse name: Not on file  . Number of children: Not on file  . Years of education: Not on file  . Highest education level: Not on file  Occupational History  . Not on file  Tobacco Use  . Smoking status: Never Smoker  . Smokeless tobacco: Never Used  Vaping Use  . Vaping Use: Never used  Substance and Sexual Activity  . Alcohol use: Yes    Alcohol/week: 0.0 standard drinks    Comment: 06/20/2014 "1-2 drinks a couple times/month"  . Drug use: No  . Sexual activity: Yes  Other Topics Concern  . Not on file  Social History Narrative  . Not on file   Social Determinants of Health   Financial Resource Strain: Not on file  Food Insecurity: Not on file  Transportation Needs: Not on file  Physical Activity: Not on file  Stress: Not on file  Social Connections: Not on file     Family History:  The patient's family history includes CAD in his brother; Cancer in his father; Heart disease in his father; Hyperlipidemia in his father.  ROS:   Please see the history of present illness. Denies any unusual bleeding on Eliquis. All other systems are reviewed and otherwise negative.     EKGs/Labs/Other Studies Reviewed:    Studies reviewed are outlined and summarized above. Reports included below if pertinent.  2D echo 713/21 IMPRESSIONS    1. Left ventricular ejection fraction, by estimation, is 60 to 65%. The  left ventricle has normal function. The left ventricle has no regional  wall motion abnormalities. Left ventricular diastolic parameters are  consistent with Grade I  diastolic  dysfunction (impaired relaxation).  2. Right ventricular systolic function is normal. The right ventricular  size is normal. There is normal pulmonary artery systolic pressure.  3. The mitral valve is normal in structure. No evidence of mitral valve  regurgitation. No evidence of mitral stenosis.  4. The aortic valve is normal in structure. Aortic valve regurgitation is  mild. No aortic stenosis is present.  5. Aortic dilatation noted. There is moderate dilatation of the ascending  aorta measuring 46 mm.  6. The inferior vena cava is normal in size with greater than 50%  respiratory variability, suggesting right atrial pressure of 3 mmHg.   Comparison(s): Prior ECHO 42 mm ascending aorta. Current 45 mm. Consider  CTA of aorta for comparison.     EKG:  EKG is not ordered today.  Recent Labs: 01/30/2020: TSH 6.200 03/28/2020: ALT 27; Hemoglobin 11.4; Platelets 264 06/25/2020: BUN 33; Creatinine, Ser 1.29; Potassium 4.9; Sodium 138  Recent Lipid Panel    Component Value Date/Time   CHOL 120 10/22/2019 0807   TRIG 169 (H) 10/22/2019 0807   HDL 45 10/22/2019 0807   CHOLHDL 2.7 10/22/2019 0807   CHOLHDL 3.8 03/29/2017 0554   VLDL 34 03/29/2017 0554   LDLCALC 47 10/22/2019 0807    PHYSICAL EXAM:    VS:  BP 111/67   Pulse 67   Ht 5\' 11"  (1.803 m)   Wt 256 lb (116.1 kg)   SpO2 96%   BMI 35.70 kg/m   BMI: Body mass index is 35.7 kg/m.  GEN: Well nourished, well developed in no acute distress HEENT: Normal NECK: No JVD; No carotid bruits LYMPHATICS: No  lymphadenopathy CARDIAC:RRR, no murmurs, rubs, gallops RESPIRATORY:  Clear to auscultation without rales, wheezing or rhonchi  ABDOMEN: Soft, non-tender, non-distended MUSCULOSKELETAL:  No edema; No deformity  SKIN: Warm and dry NEUROLOGIC:  Alert and oriented x 3 PSYCHIATRIC:  Normal affect    Wt Readings from Last 3 Encounters:  07/01/20 256 lb (116.1 kg)  05/13/20 273 lb 12.8 oz (124.2 kg)  03/28/20 267 lb 12.8 oz (121.5 kg)     ASSESSMENT & PLAN:    1. Paroxysmal atrial fibrillation  -he denies any palpitations and no recent afib -continue on apixaban 5mg  BID -denies any bleeding problems on the DOAC -Hbg 11.4 in Oct 2021  [place] with exertional angina -now having exertional chest tightness along with shooting pains that only lasts 10 seconds but very reminiscent of pain with prior stents -he has not really taken any NTG because it does not last long enough -he cannot go up stairs because he is very out of breath and stopped walking the dog any more (used to walk 4 miles last summer) due to [place].  -stress test recently was low risk.in July 2021 -I am concerned about his chest pain and DOE that are new and very limiting and I think he needs a right and left heart cath given his DOE as well and normal recent nuclear stress test with symptoms progressing.  -Shared Decision Making/Informed Consent The risks [stroke (1 in 1000), death (1 in 1000), kidney failure [usually temporary] (1 in 500), bleeding (1 in 200), allergic reaction [possibly serious] (1 in 200)], benefits (diagnostic support and management of coronary artery disease) and alternatives of a cardiac catheterization were discussed in detail with Blake Carter and he is willing to proceed.  Essential HTN  -BP well controlled on exam today -continue chlorthalidone 12.5mg  daily, Lisinopril 40mg  daily and Toprol 100mg  daily -SCR  stable at 1.29 -continue chlo Hyperlipidemia  -last LDL 47 in May 2021 -continue Crestor  40mg  daily -check FLP and ALT in May 2021  Dilated ascending aorta  -stable at 39mm in the ascending aorta and stable  Chronic diastolic CHF  - he feels at baseline -only has to use Lasix sporadically.   SSS/Bradycardia -s/p PPM followed by Dr. Lovena Le  COVID-19 Education: The signs and symptoms of COVID-19 were discussed with the patient and how to seek care for testing (follow up with PCP or arrange E-visit).  The importance of social distancing was discussed today.  Time:   Today, I have spent 25 minutes with the patient with telehealth technology discussing the above problems.    Signed, Fransico Him, MD  07/01/2020 9:14 AM    [facility]  Medication Adjustments/Labs and Tests Ordered: Current medicines are reviewed at length with the patient today.  Concerns regarding medicines are outlined above. Medication changes, Labs and Tests ordered today are summarized above and listed in the Patient Instructions accessible in Encounters.   Signed, Fransico Him, MD  07/01/2020 9:11 AM    [address], [place], [place]  [address] Phone: [phone]; Fax: [phone]

## 2020-07-01 NOTE — Patient Instructions (Addendum)
Medication Instructions:  Your physician recommends that you continue on your current medications as directed. Please refer to the Current Medication list given to you today.  *If you need a refill on your cardiac medications before your next appointment, please call your pharmacy*   Lab Work: CBC and BMET on Thursday 07/04/2019 Fasting lipids and ALT in May 2022  If you have labs (blood work) drawn today and your tests are completely normal, you will receive your results only by: Marland Kitchen MyChart Message (if you have MyChart) OR . A paper copy in the mail If you have any lab test that is abnormal or we need to change your treatment, we will call you to review the results.   Testing/Procedures: Your physician has requested that you have a cardiac catheterization. Cardiac catheterization is used to diagnose and/or treat various heart conditions. Doctors may recommend this procedure for a number of different reasons. The most common reason is to evaluate chest pain. Chest pain can be a symptom of coronary artery disease (CAD), and cardiac catheterization can show whether plaque is narrowing or blocking your heart's arteries. This procedure is also used to evaluate the valves, as well as measure the blood flow and oxygen levels in different parts of your heart. For further information please visit [email or web address]. Please follow instruction sheet, as given.   Follow-Up: At [facility], you and your health needs are our priority.  As part of our continuing mission to provide you with exceptional heart care, we have created designated Provider Care Teams.  These Care Teams include your primary Cardiologist (physician) and Advanced Practice Providers (APPs -  Physician Assistants and Nurse Practitioners) who all work together to provide you with the care you need, when you need it.  Your next appointment:   3 week(s)  The format for your next appointment:   In Person  Provider:   You may see  Fransico Him, MD or one of the following Advanced Practice Providers on your designated Care Team:    Melina Copa, PA-C  Ermalinda Barrios, PA-C   Other Instructions    Blake Carter OFFICE [place], [place] [address] Dept: [phone] Loc: [phone]  Blake Carter  07/01/2020  You are scheduled for a Cardiac Catheterization on Tuesday, February 8 with Dr. Daneen Schick.  1. Please arrive at the [facility] (Main Entrance A) at [facility]: [address] [place], [place] [address] at 5:30 AM (This time is two hours before your procedure to ensure your preparation). Free valet parking service is available.   Special note: Every effort is made to have your procedure done on time. Please understand that emergencies sometimes delay scheduled procedures.  2. Diet: Do not eat solid foods after midnight.  The patient may have clear liquids until 5am upon the day of the procedure.  3. Labs: You will need to have blood drawn on Thursday, February 3 at [facility] at [facility] [address]  Open: 7:30am - 5pm    Phone: [phone]. You do not need to be fasting.  4. Medication instructions in preparation for your procedure:   Contrast Allergy: No  Stop taking Eliquis (Apixiban) on Sunday, February 6.  Stop taking lisinopril (Zestril) and chorthalidone ([place]) on Monday February 7th.   On the morning of your procedure, take your Aspirin and any morning medicines NOT listed above.  You may use sips of  water.  5. Plan for one night stay--bring personal belongings. 6. Bring a current list of your medications and current insurance cards. 7. You MUST have a responsible person to drive you home. 8. Someone MUST be with you the first 24 hours after you arrive home or your discharge will be delayed. 9. Please wear clothes that are easy to get on and off  and wear slip-on shoes.   10. COVID-Screening: Due to recent COVID-19 restrictions implemented by our local and state authorities and in an effort to keep both patients and staff as safe as possible, our hospital system requires COVID-19 testing prior to certain scheduled hospital procedures.  Please go to [place]. [place], [place] [address] on Saturday 02/05 at 12:00  .  This is a drive up testing site.  You will not need to exit your vehicle.  You will not be billed at the time of testing but may receive a bill later depending on your insurance. You must agree to self-quarantine from the time of your testing until the procedure date on Tuesday 02/08.  This should included staying home with ONLY the people you live with.  Avoid take-out, grocery store shopping or leaving the house for any non-emergent reason.  Failure to have your COVID-19 test done on the date and time you have been scheduled will result in cancellation of your procedure.  Please call our office at [phone] if you have any questions.   Thank you for allowing [place] to care for you!   -- [facility]

## 2020-07-01 NOTE — Progress Notes (Signed)
Virtual Visit via Video Note   This visit type was conducted due to national recommendations for restrictions regarding the COVID-19 Pandemic (e.g. social distancing) in an effort to limit this patient's exposure and mitigate transmission in our community.  Due to his co-morbid illnesses, this patient is at least at moderate risk for complications without adequate follow up.  This format is felt to be most appropriate for this patient at this time.  All issues noted in this document were discussed and addressed.  A limited physical exam was performed with this format.  Please refer to the patient's chart for his consent to telehealth for [facility].       Patient Location: Home Provider Location: Home Office    Cardiology Office Note    Date:  07/01/2020   ID:  Blake Carter, DOB 07/13/1950, MRN [id number]  PCP:  Hulan Fess, MD  Cardiologist:  Fransico Him, MD  Electrophysiologist:  Cristopher Peru, MD   Chief Complaint: f/u PAF, CAD< HTN, HLD  History of Present Illness:   Blake Carter is a 70 y.o. 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  Last cath was in 2018 showing patent stents, moderately elevated LVEDP. He underwent pacemaker implantation in 2020 for progressive bradycardia. Last echo 11/2019 EF 60-65%, grade 1 DD, mild AI (normal valve structure), moderate dilation of ascending aorta (85mm) with CTA showing 4.4cm.  Was seen in the office 01/30/20 with episodic palpitations associated with SOB and chest discomfort. He wore a monitor showing NSR/ST average HR 67bpm, nonsustained atrial tachycardia, intermittent V pacing, occsaional PVCs,  bigeminal PVCs, trigeminal PVCs and ventricular couplets and triplets, and wide complex tachycardia up to 5 beats. He was started on metoprolol. Nuclear stress test was normal. It does appear that on pacemaker interrogation on 01/26/20 the paitent had actually had an episode of atrial fibrillation (prior to event monitor) therefore he was started on anticoagulation. He had itching with Eliquis so was changed to Xarelto.  He is here today for followup and is haiving a lot of problems with DOE and chest tightness with exertion.  He used to walk 4 miles daily with no problems and now cannot go out and walk his dog a short distance or climbing a flight of stairs due to chest tightness and DOE which are very reminiscent of his prior angina.  He has no rest pain.  He occasionally has some diaphoresis but he is not sure if it is from the hormones he is on.  There is on radiation of the pain and no nausea.  The pain only lasts a few seconds and then resolves with rest.     Past Medical History:  Diagnosis Date  . Allergic rhinitis   . Aortic atherosclerosis ([place])   . Arthritis    "a little bit in some of my joints" (06/20/2014)  . Ascending aorta dilatation (HCC)    59mm by chest CTA 05/2020  . Chronic diastolic CHF (congestive heart failure) ([place])   . Coronary artery disease    a. 05/2014 Cath/PCI: LM nl, LAD 40p, 60-70d (FFR 0.80->3.0x24 Synergy DES), D2 40, LCX 90d (2.25x12 Synergy DES), RCA 20-57m, EF 60-65%. b. Cath 02/2017 without acute change, elev LVEDP suggestive of dCHF.  Marland Kitchen  Depression   . Dilated aortic root (HCC)    aortic root at 10mm by Chest CTA 11/2019  . ED (erectile dysfunction)   . Environmental allergies    dry cough  . GERD (gastroesophageal reflux disease)   . History of hiatal hernia   . Hydronephrosis of right kidney    a. s/p ureteral stenting in the past.  . Hyperlipidemia    under control  . Hypertension   . Hypothyroidism   . OCD (obsessive compulsive disorder)   .  Pacemaker   . PAF (paroxysmal atrial fibrillation) ([place])   . Panic disorder   . PAT (paroxysmal atrial tachycardia) ([place])   . PONV (postoperative nausea and vomiting)    after thyroid surgery no problems in 10 years   . Prostate cancer [facility])    prostate cancer- robotic prostatectomy - followed by radiation because of positive lymph node--and pt on lupron injections  . PVC's (premature ventricular contractions)   . Sinus bradycardia    a. chronic, asymptomatic.  24 hour Holter 10/2016 showed Sinus bradycardia, sinus arrhythmia and normal sinus rhythm with average heart rate 50bpm and heart rate ranged from 32 to 100bpm.  . SUI (stress urinary incontinence), male    S/P ROBOTIC PROSTATECTOMY AND RADIATION TX    Past Surgical History:  Procedure Laterality Date  . COLONOSCOPY  07/2013   Dr. Oletta Lamas  . CORONARY ANGIOPLASTY WITH STENT PLACEMENT  06/20/2014   "2"  . CYSTOSCOPY  11/30/2011   Procedure: CYSTOSCOPY;  Surgeon: Reece Packer, MD;  Location: [facility];  Service: Urology;  Laterality: N/A;  Inplantation of Artificial Sphincter and Cystoscopy  . CYSTOSCOPY WITH RETROGRADE PYELOGRAM, URETEROSCOPY AND STENT PLACEMENT Right 02/11/2014   Procedure: CYSTOSCOPY WITH RETROGRADE PYELOGRAM, URETEROSCOPY ,URETERAL BIOPSY AND STENT PLACEMENT;  Surgeon: Raynelle Bring, MD;  Location: [facility];  Service: Urology;  Laterality: Right;  . CYSTOSCOPY WITH RETROGRADE PYELOGRAM, URETEROSCOPY AND STENT PLACEMENT Right 04/15/2014   Procedure: CYSTOSCOPY WITH RETROGRADE PYELOGRAM, URETEROSCOPY, BIOPSY AND STENT PLACEMENT;  Surgeon: Raynelle Bring, MD;  Location: [facility];  Service: Urology;  Laterality: Right;  . LEFT HEART CATH AND CORONARY ANGIOGRAPHY N/A 03/29/2017   Procedure: LEFT HEART CATH AND CORONARY ANGIOGRAPHY;  Surgeon: Leonie Man, MD;  Location: [place] [facility];  Service: Cardiovascular;  Laterality: N/A;  . LEFT HEART CATHETERIZATION WITH CORONARY ANGIOGRAM N/A 06/20/2014   Procedure: LEFT  HEART CATHETERIZATION WITH CORONARY ANGIOGRAM;  Surgeon: Leonie Man, MD;  Location: [facility] CATH LAB;  Service: Cardiovascular;  Laterality: N/A;  . PACEMAKER IMPLANT N/A 05/21/2019   Procedure: PACEMAKER IMPLANT;  Surgeon: Evans Lance, MD;  Location: [place] [facility];  Service: Cardiovascular;  Laterality: N/A;  . PERCUTANEOUS CORONARY STENT INTERVENTION (PCI-S)  06/20/2014   Procedure: PERCUTANEOUS CORONARY STENT INTERVENTION (PCI-S);  Surgeon: Leonie Man, MD;  Location: [facility] CATH LAB;  Service: Cardiovascular;;  LAD and Circumflex  . REFRACTIVE SURGERY Bilateral 2004  . ROBOT ASSISTED LAPAROSCOPIC RADICAL PROSTATECTOMY  05/2010  . THYROIDECTOMY, PARTIAL  10/1974  . TONSILLECTOMY  1956   . TOTAL THYROIDECTOMY  10/2012   "nuked it"  . UPPER GI ENDOSCOPY     food impaction 2009 done by Dr Oletta Lamas  . URINARY SPHINCTER IMPLANT  11/30/2011   Procedure: ARTIFICIAL URINARY SPHINCTER;  Surgeon: Reece Packer, MD;  Location: [facility];  Service: Urology;  Laterality: N/A;    Current Medications: Current Meds  Medication Sig  . acetaminophen (TYLENOL) 500 MG tablet Take 1,000 mg by  mouth every 6 (six) hours as needed (pain).  . Cholecalciferol (VITAMIN D3) 2000 units TABS Take 6,000 Units by mouth 3 (three) times a week.   . diazepam (VALIUM) 5 MG tablet Take 1 tablet (5 mg total) by mouth at bedtime.  . fenofibrate (TRICOR) 145 MG tablet Take 1 tablet (145 mg total) by mouth daily.  . fexofenadine (ALLEGRA) 180 MG tablet Take 180 mg by mouth daily.  . fluvoxaMINE (LUVOX) 50 MG tablet Take 200 mg by mouth every morning.   . furosemide (LASIX) 20 MG tablet TAKE 1 TABLET BY MOUTH  DAILY AS NEEDED FOR FLUID  OR EDEMA  . latanoprost (XALATAN) 0.005 % ophthalmic solution Place 1 drop into both eyes at bedtime.   Marland Kitchen leuprolide, 6 Month, (ELIGARD) 45 MG injection Inject 45 mg into the skin every 6 (six) months.  . levothyroxine (SYNTHROID) 175 MCG tablet PT TAKES 1 TABLET BY MOUTH DAILY EXCEPT  SUNDAYS  . lisinopril (ZESTRIL) 10 MG tablet Take 1 tablet by mouth daily.  Marland Kitchen MYRBETRIQ 50 MG TB24 tablet Take 50 mg by mouth daily.  . nitroGLYCERIN (NITROSTAT) 0.4 MG SL tablet PLACE 1 TABLET (0.4 MG TOTAL) UNDER THE TONGUE EVERY 5 (FIVE) MINUTES AS NEEDED FOR CHEST PAIN.  Marland Kitchen pantoprazole (PROTONIX) 40 MG tablet Take 2 tablets (80 mg total) by mouth daily.  . rivaroxaban (XARELTO) 20 MG TABS tablet Take 1 tablet (20 mg total) by mouth daily with supper.  Marland Kitchen amLODipine (NORVASC) 10 MG tablet Take 1 tablet (10 mg total) by mouth every evening.  . metoprolol succinate (TOPROL XL) 25 MG 24 hr tablet Take 1 tablet (25 mg total) by mouth daily.        Allergies:   Tagamet [cimetidine], Atorvastatin, and Niacin and related   Social History   Socioeconomic History  . Marital status: Married    Spouse name: Not on file  . Number of children: Not on file  . Years of education: Not on file  . Highest education level: Not on file  Occupational History  . Not on file  Tobacco Use  . Smoking status: Never Smoker  . Smokeless tobacco: Never Used  Vaping Use  . Vaping Use: Never used  Substance and Sexual Activity  . Alcohol use: Yes    Alcohol/week: 0.0 standard drinks    Comment: 06/20/2014 "1-2 drinks a couple times/month"  . Drug use: No  . Sexual activity: Yes  Other Topics Concern  . Not on file  Social History Narrative  . Not on file   Social Determinants of Health   Financial Resource Strain: Not on file  Food Insecurity: Not on file  Transportation Needs: Not on file  Physical Activity: Not on file  Stress: Not on file  Social Connections: Not on file     Family History:  The patient's family history includes CAD in his brother; Cancer in his father; Heart disease in his father; Hyperlipidemia in his father.  ROS:   Please see the history of present illness. Denies any unusual bleeding on Eliquis. All other systems are reviewed and otherwise negative.     EKGs/Labs/Other Studies Reviewed:    Studies reviewed are outlined and summarized above. Reports included below if pertinent.  2D echo 713/21 IMPRESSIONS    1. Left ventricular ejection fraction, by estimation, is 60 to 65%. The  left ventricle has normal function. The left ventricle has no regional  wall motion abnormalities. Left ventricular diastolic parameters are  consistent with Grade I  diastolic  dysfunction (impaired relaxation).  2. Right ventricular systolic function is normal. The right ventricular  size is normal. There is normal pulmonary artery systolic pressure.  3. The mitral valve is normal in structure. No evidence of mitral valve  regurgitation. No evidence of mitral stenosis.  4. The aortic valve is normal in structure. Aortic valve regurgitation is  mild. No aortic stenosis is present.  5. Aortic dilatation noted. There is moderate dilatation of the ascending  aorta measuring 46 mm.  6. The inferior vena cava is normal in size with greater than 50%  respiratory variability, suggesting right atrial pressure of 3 mmHg.   Comparison(s): Prior ECHO 42 mm ascending aorta. Current 45 mm. Consider  CTA of aorta for comparison.     EKG:  EKG is not ordered today.  Recent Labs: 01/30/2020: TSH 6.200 03/28/2020: ALT 27; Hemoglobin 11.4; Platelets 264 06/25/2020: BUN 33; Creatinine, Ser 1.29; Potassium 4.9; Sodium 138  Recent Lipid Panel    Component Value Date/Time   CHOL 120 10/22/2019 0807   TRIG 169 (H) 10/22/2019 0807   HDL 45 10/22/2019 0807   CHOLHDL 2.7 10/22/2019 0807   CHOLHDL 3.8 03/29/2017 0554   VLDL 34 03/29/2017 0554   LDLCALC 47 10/22/2019 0807    PHYSICAL EXAM:    VS:  BP 111/67   Pulse 67   Ht 5\' 11"  (1.803 m)   Wt 256 lb (116.1 kg)   SpO2 96%   BMI 35.70 kg/m   BMI: Body mass index is 35.7 kg/m.  GEN: Well nourished, well developed in no acute distress HEENT: Normal NECK: No JVD; No carotid bruits LYMPHATICS: No  lymphadenopathy CARDIAC:RRR, no murmurs, rubs, gallops RESPIRATORY:  Clear to auscultation without rales, wheezing or rhonchi  ABDOMEN: Soft, non-tender, non-distended MUSCULOSKELETAL:  No edema; No deformity  SKIN: Warm and dry NEUROLOGIC:  Alert and oriented x 3 PSYCHIATRIC:  Normal affect    Wt Readings from Last 3 Encounters:  07/01/20 256 lb (116.1 kg)  05/13/20 273 lb 12.8 oz (124.2 kg)  03/28/20 267 lb 12.8 oz (121.5 kg)     ASSESSMENT & PLAN:    1. Paroxysmal atrial fibrillation  -he denies any palpitations and no recent afib -continue on apixaban 5mg  BID -denies any bleeding problems on the DOAC -Hbg 11.4 in Oct 2021  [place] with exertional angina -now having exertional chest tightness along with shooting pains that only lasts 10 seconds but very reminiscent of pain with prior stents -he has not really taken any NTG because it does not last long enough -he cannot go up stairs because he is very out of breath and stopped walking the dog any more (used to walk 4 miles last summer) due to [place].  -stress test recently was low risk.in July 2021 -I am concerned about his chest pain and DOE that are new and very limiting and I think he needs a right and left heart cath given his DOE as well and normal recent nuclear stress test with symptoms progressing.  -Shared Decision Making/Informed Consent The risks [stroke (1 in 1000), death (1 in 1000), kidney failure [usually temporary] (1 in 500), bleeding (1 in 200), allergic reaction [possibly serious] (1 in 200)], benefits (diagnostic support and management of coronary artery disease) and alternatives of a cardiac catheterization were discussed in detail with Mr. Marciante and he is willing to proceed.  Essential HTN  -BP well controlled on exam today -continue chlorthalidone 12.5mg  daily, Lisinopril 40mg  daily and Toprol 100mg  daily -SCR  stable at 1.29 -continue chlo Hyperlipidemia  -last LDL 47 in May 2021 -continue Crestor  40mg  daily -check FLP and ALT in May 2021  Dilated ascending aorta  -stable at 39mm in the ascending aorta and stable  Chronic diastolic CHF  - he feels at baseline -only has to use Lasix sporadically.   SSS/Bradycardia -s/p PPM followed by Dr. Lovena Le  COVID-19 Education: The signs and symptoms of COVID-19 were discussed with the patient and how to seek care for testing (follow up with PCP or arrange E-visit).  The importance of social distancing was discussed today.  Time:   Today, I have spent 25 minutes with the patient with telehealth technology discussing the above problems.    Signed, Fransico Him, MD  07/01/2020 9:14 AM    [facility]  Medication Adjustments/Labs and Tests Ordered: Current medicines are reviewed at length with the patient today.  Concerns regarding medicines are outlined above. Medication changes, Labs and Tests ordered today are summarized above and listed in the Patient Instructions accessible in Encounters.   Signed, Fransico Him, MD  07/01/2020 9:11 AM    [address], [place], [place]  [address] Phone: [phone]; Fax: [phone]

## 2020-07-03 ENCOUNTER — Other Ambulatory Visit: Payer: Medicare PPO

## 2020-07-03 DIAGNOSIS — I2583 Coronary atherosclerosis due to lipid rich plaque: Secondary | ICD-10-CM | POA: Diagnosis not present

## 2020-07-03 DIAGNOSIS — I2510 Atherosclerotic heart disease of native coronary artery without angina pectoris: Secondary | ICD-10-CM | POA: Diagnosis not present

## 2020-07-03 DIAGNOSIS — I77819 Aortic ectasia, unspecified site: Secondary | ICD-10-CM | POA: Diagnosis not present

## 2020-07-03 DIAGNOSIS — I10 Essential (primary) hypertension: Secondary | ICD-10-CM | POA: Diagnosis not present

## 2020-07-03 DIAGNOSIS — I5032 Chronic diastolic (congestive) heart failure: Secondary | ICD-10-CM | POA: Diagnosis not present

## 2020-07-03 DIAGNOSIS — I495 Sick sinus syndrome: Secondary | ICD-10-CM | POA: Diagnosis not present

## 2020-07-03 DIAGNOSIS — E7800 Pure hypercholesterolemia, unspecified: Secondary | ICD-10-CM | POA: Diagnosis not present

## 2020-07-03 DIAGNOSIS — I480 Paroxysmal atrial fibrillation: Secondary | ICD-10-CM | POA: Diagnosis not present

## 2020-07-03 LAB — BASIC METABOLIC PANEL
BUN/Creatinine Ratio: 29 — ABNORMAL HIGH (ref 10–24)
BUN: 36 mg/dL — ABNORMAL HIGH (ref 8–27)
CO2: 27 mmol/L (ref 20–29)
Calcium: 10.1 mg/dL (ref 8.6–10.2)
Chloride: 102 mmol/L (ref 96–106)
Creatinine, Ser: 1.23 mg/dL (ref 0.76–1.27)
GFR calc Af Amer: 69 mL/min/{1.73_m2} (ref >59)
GFR calc non Af Amer: 60 mL/min/{1.73_m2} (ref >59)
Glucose: 101 mg/dL — ABNORMAL HIGH (ref 65–99)
Potassium: 5.1 mmol/L (ref 3.5–5.2)
Sodium: 140 mmol/L (ref 134–144)

## 2020-07-03 LAB — CBC
Hematocrit: 35.1 % — ABNORMAL LOW (ref 37.5–51.0)
Hemoglobin: 11.4 g/dL — ABNORMAL LOW (ref 13.0–17.7)
MCH: 28 pg (ref 26.6–33.0)
MCHC: 32.5 g/dL (ref 31.5–35.7)
MCV: 86 fL (ref 79–97)
Platelets: 280 10*3/uL (ref 150–450)
RBC: 4.07 x10E6/uL — ABNORMAL LOW (ref 4.14–5.80)
RDW: 14.5 % (ref 11.6–15.4)
WBC: 5.5 10*3/uL (ref 3.4–10.8)

## 2020-07-05 ENCOUNTER — Other Ambulatory Visit (HOSPITAL_COMMUNITY)
Admission: RE | Admit: 2020-07-05 | Discharge: 2020-07-05 | Disposition: A | Discharge status: Home / Self Care | Payer: Medicare PPO | Source: Ambulatory Visit | Attending: Interventional Cardiology | Admitting: Interventional Cardiology

## 2020-07-05 DIAGNOSIS — Z20822 Contact with and (suspected) exposure to covid-19: Secondary | ICD-10-CM | POA: Diagnosis not present

## 2020-07-05 DIAGNOSIS — Z01812 Encounter for preprocedural laboratory examination: Secondary | ICD-10-CM | POA: Diagnosis not present

## 2020-07-05 LAB — SARS CORONAVIRUS 2 (TAT 6-24 HRS): SARS Coronavirus 2: NEGATIVE

## 2020-07-07 ENCOUNTER — Telehealth: Payer: Self-pay | Admitting: *Deleted

## 2020-07-07 NOTE — Telephone Encounter (Signed)
Pt contacted pre-catheterization scheduled at [facility] for: Tuesday July 08, 2020 7:30 AM Verified arrival time and place: [facility]) at: 5:30 AM   No solid food after midnight prior to cath, clear liquids until 5 AM day of procedure.  Hold: Eliquis-none 07/06/20 until post procedure Chlorthalidone-AM of procedure Lasix-AM of procedure  Except hold medications AM meds can be  taken pre-cath with sips of water including: ASA 81 mg   Confirmed patient has responsible adult to drive home post procedure and be with patient first 24 hours after arriving home:  You are allowed ONE visitor in the waiting room during the time you are at the hospital for your procedure. Both you and your visitor must wear a mask once you enter the hospital.   [facility] to review procedure instructions with patient.

## 2020-07-07 NOTE — Telephone Encounter (Signed)
Voicemail message, no answer. 

## 2020-07-07 NOTE — Telephone Encounter (Signed)
No answer, voicemail message. 

## 2020-07-07 NOTE — H&P (Signed)
Prior LAD and circumflex stents.  Residual right coronary disease. Recent exertional dyspnea and chest tightness.

## 2020-07-08 ENCOUNTER — Encounter (HOSPITAL_COMMUNITY): Payer: Self-pay | Admitting: Interventional Cardiology

## 2020-07-08 ENCOUNTER — Ambulatory Visit (HOSPITAL_COMMUNITY)
Admission: RE | Admit: 2020-07-08 | Discharge: 2020-07-08 | Disposition: A | Discharge status: Home / Self Care | Payer: Medicare PPO | Attending: Interventional Cardiology | Admitting: Interventional Cardiology

## 2020-07-08 ENCOUNTER — Other Ambulatory Visit: Payer: Self-pay

## 2020-07-08 ENCOUNTER — Encounter (HOSPITAL_COMMUNITY)
Admission: RE | Disposition: A | Discharge status: Home / Self Care | Payer: Self-pay | Source: Home / Self Care | Attending: Interventional Cardiology

## 2020-07-08 DIAGNOSIS — I2510 Atherosclerotic heart disease of native coronary artery without angina pectoris: Secondary | ICD-10-CM | POA: Diagnosis not present

## 2020-07-08 DIAGNOSIS — Z955 Presence of coronary angioplasty implant and graft: Secondary | ICD-10-CM | POA: Insufficient documentation

## 2020-07-08 DIAGNOSIS — I495 Sick sinus syndrome: Secondary | ICD-10-CM | POA: Diagnosis not present

## 2020-07-08 DIAGNOSIS — I5032 Chronic diastolic (congestive) heart failure: Secondary | ICD-10-CM | POA: Diagnosis not present

## 2020-07-08 DIAGNOSIS — E785 Hyperlipidemia, unspecified: Secondary | ICD-10-CM | POA: Insufficient documentation

## 2020-07-08 DIAGNOSIS — I10 Essential (primary) hypertension: Secondary | ICD-10-CM | POA: Diagnosis present

## 2020-07-08 DIAGNOSIS — I209 Angina pectoris, unspecified: Secondary | ICD-10-CM

## 2020-07-08 DIAGNOSIS — Z7901 Long term (current) use of anticoagulants: Secondary | ICD-10-CM | POA: Insufficient documentation

## 2020-07-08 DIAGNOSIS — I110 Hypertensive heart disease with heart failure: Secondary | ICD-10-CM | POA: Diagnosis not present

## 2020-07-08 DIAGNOSIS — I480 Paroxysmal atrial fibrillation: Secondary | ICD-10-CM | POA: Insufficient documentation

## 2020-07-08 DIAGNOSIS — Z79899 Other long term (current) drug therapy: Secondary | ICD-10-CM | POA: Insufficient documentation

## 2020-07-08 DIAGNOSIS — I77810 Thoracic aortic ectasia: Secondary | ICD-10-CM | POA: Diagnosis present

## 2020-07-08 DIAGNOSIS — I25119 Atherosclerotic heart disease of native coronary artery with unspecified angina pectoris: Secondary | ICD-10-CM | POA: Insufficient documentation

## 2020-07-08 DIAGNOSIS — R0609 Other forms of dyspnea: Secondary | ICD-10-CM | POA: Diagnosis not present

## 2020-07-08 DIAGNOSIS — Z79890 Hormone replacement therapy (postmenopausal): Secondary | ICD-10-CM | POA: Insufficient documentation

## 2020-07-08 DIAGNOSIS — Z8249 Family history of ischemic heart disease and other diseases of the circulatory system: Secondary | ICD-10-CM

## 2020-07-08 HISTORY — PX: CORONARY PRESSURE/FFR STUDY: CATH118243

## 2020-07-08 HISTORY — PX: RIGHT/LEFT HEART CATH AND CORONARY ANGIOGRAPHY: CATH118266

## 2020-07-08 LAB — POCT I-STAT 7, (LYTES, BLD GAS, ICA,H+H)
Acid-Base Excess: 0 mmol/L (ref 0.0–2.0)
Bicarbonate: 25.8 mmol/L (ref 20.0–28.0)
Calcium, Ion: 1.24 mmol/L (ref 1.15–1.40)
HCT: 31 % — ABNORMAL LOW (ref 39.0–52.0)
Hemoglobin: 10.5 g/dL — ABNORMAL LOW (ref 13.0–17.0)
O2 Saturation: 99 %
Potassium: 4.1 mmol/L (ref 3.5–5.1)
Sodium: 136 mmol/L (ref 135–145)
TCO2: 27 mmol/L (ref 22–32)
pCO2 arterial: 46.8 mmHg (ref 32.0–48.0)
pH, Arterial: 7.349 — ABNORMAL LOW (ref 7.350–7.450)
pO2, Arterial: 150 mmHg — ABNORMAL HIGH (ref 83.0–108.0)

## 2020-07-08 LAB — POCT I-STAT EG7
Acid-Base Excess: 0 mmol/L (ref 0.0–2.0)
Acid-Base Excess: 2 mmol/L (ref 0.0–2.0)
Bicarbonate: 26.2 mmol/L (ref 20.0–28.0)
Bicarbonate: 27.8 mmol/L (ref 20.0–28.0)
Calcium, Ion: 1.24 mmol/L (ref 1.15–1.40)
Calcium, Ion: 1.24 mmol/L (ref 1.15–1.40)
HCT: 30 % — ABNORMAL LOW (ref 39.0–52.0)
HCT: 31 % — ABNORMAL LOW (ref 39.0–52.0)
Hemoglobin: 10.2 g/dL — ABNORMAL LOW (ref 13.0–17.0)
Hemoglobin: 10.5 g/dL — ABNORMAL LOW (ref 13.0–17.0)
O2 Saturation: 73 %
O2 Saturation: 77 %
Potassium: 4.1 mmol/L (ref 3.5–5.1)
Potassium: 4.1 mmol/L (ref 3.5–5.1)
Sodium: 136 mmol/L (ref 135–145)
Sodium: 139 mmol/L (ref 135–145)
TCO2: 28 mmol/L (ref 22–32)
TCO2: 29 mmol/L (ref 22–32)
pCO2, Ven: 47.6 mmHg (ref 44.0–60.0)
pCO2, Ven: 49 mmHg (ref 44.0–60.0)
pH, Ven: 7.348 (ref 7.250–7.430)
pH, Ven: 7.362 (ref 7.250–7.430)
pO2, Ven: 41 mmHg (ref 32.0–45.0)
pO2, Ven: 44 mmHg (ref 32.0–45.0)

## 2020-07-08 LAB — POCT ACTIVATED CLOTTING TIME
Activated Clotting Time: 208 seconds
Activated Clotting Time: 231 seconds
Activated Clotting Time: 249 seconds
Activated Clotting Time: 261 seconds

## 2020-07-08 SURGERY — RIGHT/LEFT HEART CATH AND CORONARY ANGIOGRAPHY
Anesthesia: LOCAL

## 2020-07-08 MED ORDER — SODIUM CHLORIDE 0.9 % IV SOLN: 250.0000 mL | INTRAVENOUS | Status: DC | PRN

## 2020-07-08 MED ORDER — HEPARIN (PORCINE) IN NACL 1000-0.9 UT/500ML-% IV SOLN
INTRAVENOUS | Status: DC | PRN
  Administered 2020-07-08: 500 mL

## 2020-07-08 MED ORDER — MIDAZOLAM HCL 2 MG/2ML IJ SOLN
INTRAMUSCULAR | Status: DC | PRN
  Administered 2020-07-08 (×3): 1 mg via INTRAVENOUS

## 2020-07-08 MED ORDER — HEPARIN SODIUM (PORCINE) 1000 UNIT/ML IJ SOLN
INTRAMUSCULAR | Status: DC | PRN
  Administered 2020-07-08 (×2): 3000 [IU] via INTRAVENOUS
  Administered 2020-07-08: 4000 [IU] via INTRAVENOUS
  Administered 2020-07-08: 2500 [IU] via INTRAVENOUS
  Administered 2020-07-08: 6000 [IU] via INTRAVENOUS

## 2020-07-08 MED ORDER — ADENOSINE (DIAGNOSTIC) 140MCG/KG/MIN
INTRAVENOUS | Status: DC | PRN
  Administered 2020-07-08: 140 ug/kg/min via INTRAVENOUS

## 2020-07-08 MED ORDER — SODIUM CHLORIDE 0.9% FLUSH: 3.0000 mL | INTRAVENOUS | Status: DC | PRN

## 2020-07-08 MED ORDER — LIDOCAINE HCL (PF) 1 % IJ SOLN
INTRAMUSCULAR | Status: DC | PRN
  Administered 2020-07-08 (×2): 2 mL
  Administered 2020-07-08: 1 mL

## 2020-07-08 MED ORDER — ADENOSINE 12 MG/4ML IV SOLN
INTRAVENOUS | Status: AC
  Filled 2020-07-08: qty 16

## 2020-07-08 MED ORDER — MIDAZOLAM HCL 2 MG/2ML IJ SOLN
INTRAMUSCULAR | Status: AC
  Filled 2020-07-08: qty 2

## 2020-07-08 MED ORDER — SODIUM CHLORIDE 0.9 % WEIGHT BASED INFUSION
3.0000 mL/kg/h | INTRAVENOUS | Status: AC
  Administered 2020-07-08: 3 mL/kg/h via INTRAVENOUS

## 2020-07-08 MED ORDER — VERAPAMIL HCL 2.5 MG/ML IV SOLN
INTRAVENOUS | Status: DC | PRN
  Administered 2020-07-08: 10 mL via INTRA_ARTERIAL

## 2020-07-08 MED ORDER — ACETAMINOPHEN 325 MG PO TABS: 650.0000 mg | ORAL_TABLET | ORAL | Status: DC | PRN

## 2020-07-08 MED ORDER — ASPIRIN 81 MG PO CHEW
81.0000 mg | CHEWABLE_TABLET | ORAL | Status: AC
  Administered 2020-07-08: 81 mg via ORAL
  Filled 2020-07-08: qty 1

## 2020-07-08 MED ORDER — OXYCODONE HCL 5 MG PO TABS: 5.0000 mg | ORAL_TABLET | ORAL | Status: DC | PRN

## 2020-07-08 MED ORDER — HEPARIN (PORCINE) IN NACL 1000-0.9 UT/500ML-% IV SOLN
INTRAVENOUS | Status: AC
  Filled 2020-07-08: qty 1000

## 2020-07-08 MED ORDER — VERAPAMIL HCL 2.5 MG/ML IV SOLN
INTRAVENOUS | Status: AC
  Filled 2020-07-08: qty 2

## 2020-07-08 MED ORDER — HEPARIN SODIUM (PORCINE) 1000 UNIT/ML IJ SOLN
INTRAMUSCULAR | Status: AC
  Filled 2020-07-08: qty 1

## 2020-07-08 MED ORDER — SODIUM CHLORIDE 0.9 % IV SOLN: INTRAVENOUS | Status: AC

## 2020-07-08 MED ORDER — IOHEXOL 350 MG/ML SOLN
INTRAVENOUS | Status: DC | PRN
  Administered 2020-07-08: 140 mL

## 2020-07-08 MED ORDER — LIDOCAINE HCL (PF) 1 % IJ SOLN
INTRAMUSCULAR | Status: AC
  Filled 2020-07-08: qty 30

## 2020-07-08 MED ORDER — HYDRALAZINE HCL 20 MG/ML IJ SOLN: 10.0000 mg | INTRAMUSCULAR | Status: DC | PRN

## 2020-07-08 MED ORDER — FENTANYL CITRATE (PF) 100 MCG/2ML IJ SOLN
INTRAMUSCULAR | Status: DC | PRN
  Administered 2020-07-08: 50 ug via INTRAVENOUS
  Administered 2020-07-08 (×2): 25 ug via INTRAVENOUS

## 2020-07-08 MED ORDER — ONDANSETRON HCL 4 MG/2ML IJ SOLN: 4.0000 mg | Freq: Four times a day (QID) | INTRAMUSCULAR | Status: DC | PRN

## 2020-07-08 MED ORDER — NITROGLYCERIN 1 MG/10 ML FOR IR/CATH LAB
INTRA_ARTERIAL | Status: AC
  Filled 2020-07-08: qty 10

## 2020-07-08 MED ORDER — SODIUM CHLORIDE 0.9% FLUSH: 3.0000 mL | Freq: Two times a day (BID) | INTRAVENOUS | Status: DC

## 2020-07-08 MED ORDER — FENTANYL CITRATE (PF) 100 MCG/2ML IJ SOLN
INTRAMUSCULAR | Status: AC
  Filled 2020-07-08: qty 2

## 2020-07-08 MED ORDER — LABETALOL HCL 5 MG/ML IV SOLN: 10.0000 mg | INTRAVENOUS | Status: DC | PRN

## 2020-07-08 MED ORDER — SODIUM CHLORIDE 0.9 % WEIGHT BASED INFUSION
1.0000 mL/kg/h | INTRAVENOUS | Status: DC
  Administered 2020-07-08: 500 mL via INTRAVENOUS

## 2020-07-08 MED ORDER — NITROGLYCERIN 1 MG/10 ML FOR IR/CATH LAB
INTRA_ARTERIAL | Status: DC | PRN
  Administered 2020-07-08: 200 ug via INTRACORONARY

## 2020-07-08 SURGICAL SUPPLY — 20 items
BAG SNAP BAND KOVER 36X36 (MISCELLANEOUS) ×2 IMPLANT
CATH 5FR JL3.5 JR4 ANG PIG MP (CATHETERS) ×2 IMPLANT
CATH BALLN WEDGE 5F 110CM (CATHETERS) ×2 IMPLANT
CATH VISTA GUIDE 6FR XBLAD3.5 (CATHETERS) ×2 IMPLANT
COVER DOME SNAP 22 D (MISCELLANEOUS) ×2 IMPLANT
DEVICE RAD COMP TR BAND LRG (VASCULAR PRODUCTS) ×2 IMPLANT
GLIDESHEATH SLEND A-KIT 6F 22G (SHEATH) ×2 IMPLANT
GUIDEWIRE .025 260CM (WIRE) ×2 IMPLANT
GUIDEWIRE INQWIRE 1.5J.035X260 (WIRE) ×1 IMPLANT
GUIDEWIRE PRESSURE X 175 (WIRE) ×2 IMPLANT
INQWIRE 1.5J .035X260CM (WIRE) ×2
KIT ESSENTIALS PG (KITS) ×2 IMPLANT
KIT HEART LEFT (KITS) ×2 IMPLANT
KIT MICROPUNCTURE NIT STIFF (SHEATH) ×2 IMPLANT
PACK CARDIAC CATHETERIZATION (CUSTOM PROCEDURE TRAY) ×2 IMPLANT
SHEATH GLIDE SLENDER 4/5FR (SHEATH) ×2 IMPLANT
SHEATH PROBE COVER 6X72 (BAG) ×2 IMPLANT
TRANSDUCER W/STOPCOCK (MISCELLANEOUS) ×2 IMPLANT
TUBING CIL FLEX 10 FLL-RA (TUBING) ×2 IMPLANT
WIRE MICROINTRODUCER 60CM (WIRE) ×4 IMPLANT

## 2020-07-08 NOTE — Progress Notes (Signed)
Attempted to remove air. Bleeding noted 6 cc added back to TRB

## 2020-07-08 NOTE — Interval H&P Note (Signed)
Cath Lab Visit (complete for each Cath Lab visit)  Clinical Evaluation Leading to the Procedure:   ACS: No.  Non-ACS:    Anginal Classification: CCS III  Anti-ischemic medical therapy: Maximal Therapy (2 or more classes of medications)  Non-Invasive Test Results: No non-invasive testing performed  Prior CABG: No previous CABG      History and Physical Interval Note:  07/08/2020 7:28 AM  Blake Carter  has presented today for surgery, with the diagnosis of chest pain.  The various methods of treatment have been discussed with the patient and family. After consideration of risks, benefits and other options for treatment, the patient has consented to  Procedure(s): RIGHT/LEFT HEART CATH AND CORONARY ANGIOGRAPHY (N/A) as a surgical intervention.  The patient's history has been reviewed, patient examined, no change in status, stable for surgery.  I have reviewed the patient's chart and labs.  Questions were answered to the patient's satisfaction.     Belva Crome III

## 2020-07-08 NOTE — CV Procedure (Signed)
   30% segmental narrowing proximal to the LAD stent.  Circumflex, left main, and LAD are widely patent.  Physiologic assessment: LAD FFR with adenosine 0.82; RFR 0.9; coronary flow reserve 3.6; IMR 12.0  Normal LV function, with normal filling pressure.  Normal pulmonary artery pressures.  RECOMMENDATIONS: No physiologically significant epicardial disease.  No evidence of microcirculatory dysfunction.  Exertional symptoms may be a combination of multiple factors including anemia, obesity, and deconditioning.  Continue risk factor modification.  Local therapy which includes beta-blocker, ACE inhibitor, diuretic, statin therapy, and as needed sublingual nitroglycerin.

## 2020-07-08 NOTE — Discharge Instructions (Signed)
Resume anticoagulation in a.m.    Radial Site Care  This sheet gives you information about how to care for yourself after your procedure. Your health care provider may also give you more specific instructions. If you have problems or questions, contact your health care provider. What can I expect after the procedure? After the procedure, it is common to have:  Bruising and tenderness at the catheter insertion area. Follow these instructions at home: Medicines  Take over-the-counter and prescription medicines only as told by your health care provider. Insertion site care  Follow instructions from your health care provider about how to take care of your insertion site. Make sure you: ? Wash your hands with soap and water before you change your bandage (dressing). If soap and water are not available, use hand sanitizer. ? Change your dressing as told by your health care provider. ? Leave stitches (sutures), skin glue, or adhesive strips in place. These skin closures may need to stay in place for 2 weeks or longer. If adhesive strip edges start to loosen and curl up, you may trim the loose edges. Do not remove adhesive strips completely unless your health care provider tells you to do that.  Check your insertion site every day for signs of infection. Check for: ? Redness, swelling, or pain. ? Fluid or blood. ? Pus or a bad smell. ? Warmth.  Do not take baths, swim, or use a hot tub until your health care provider approves.  You may shower 24-48 hours after the procedure, or as directed by your health care provider. ? Remove the dressing and gently wash the site with plain soap and water. ? Pat the area dry with a clean towel. ? Do not rub the site. That could cause bleeding.  Do not apply powder or lotion to the site. Activity  For 24 hours after the procedure, or as directed by your health care provider: ? Do not flex or bend the affected arm. ? Do not push or pull heavy  objects with the affected arm. ? Do not drive yourself home from the hospital or clinic. You may drive 24 hours after the procedure unless your health care provider tells you not to. ? Do not operate machinery or power tools.  Do not lift anything that is heavier than 10 lb (4.5 kg), or the limit that you are told, until your health care provider says that it is safe.  Ask your health care provider when it is okay to: ? Return to work or school. ? Resume usual physical activities or sports. ? Resume sexual activity.   General instructions  If the catheter site starts to bleed, raise your arm and put firm pressure on the site. If the bleeding does not stop, get help right away. This is a medical emergency.  If you went home on the same day as your procedure, a responsible adult should be with you for the first 24 hours after you arrive home.  Keep all follow-up visits as told by your health care provider. This is important. Contact a health care provider if:  You have a fever.  You have redness, swelling, or yellow drainage around your insertion site. Get help right away if:  You have unusual pain at the radial site.  The catheter insertion area swells very fast.  The insertion area is bleeding, and the bleeding does not stop when you hold steady pressure on the area.  Your arm or hand becomes pale, cool,  tingly, or numb. These symptoms may represent a serious problem that is an emergency. Do not wait to see if the symptoms will go away. Get medical help right away. Call your local emergency services (911 in the [place] Do not drive yourself to the hospital. Summary  After the procedure, it is common to have bruising and tenderness at the site.  Follow instructions from your health care provider about how to take care of your radial site wound. Check the wound every day for signs of infection.  Do not lift anything that is heavier than 10 lb (4.5 kg), or the limit that you are  told, until your health care provider says that it is safe. This information is not intended to replace advice given to you by your health care provider. Make sure you discuss any questions you have with your health care provider. Document Revised: 06/22/2017 Document Reviewed: 06/22/2017 Elsevier Patient Education  2021 [employer].

## 2020-07-08 NOTE — Telephone Encounter (Signed)
R/LHC done 07/08/20

## 2020-07-10 ENCOUNTER — Encounter: Payer: Self-pay | Admitting: Adult Health

## 2020-07-10 ENCOUNTER — Other Ambulatory Visit: Payer: Self-pay

## 2020-07-10 ENCOUNTER — Ambulatory Visit (INDEPENDENT_AMBULATORY_CARE_PROVIDER_SITE_OTHER): Payer: Medicare PPO | Admitting: Adult Health

## 2020-07-10 DIAGNOSIS — F429 Obsessive-compulsive disorder, unspecified: Secondary | ICD-10-CM

## 2020-07-10 DIAGNOSIS — F331 Major depressive disorder, recurrent, moderate: Secondary | ICD-10-CM | POA: Diagnosis not present

## 2020-07-10 DIAGNOSIS — G4700 Insomnia, unspecified: Secondary | ICD-10-CM | POA: Diagnosis not present

## 2020-07-10 DIAGNOSIS — F411 Generalized anxiety disorder: Secondary | ICD-10-CM | POA: Diagnosis not present

## 2020-07-10 MED ORDER — FLUVOXAMINE MALEATE 50 MG PO TABS
200.0000 mg | ORAL_TABLET | Freq: Every morning | ORAL | 3 refills | Status: DC
Start: 2020-07-10 — End: 2020-07-18

## 2020-07-10 MED ORDER — DIAZEPAM 5 MG PO TABS: 5.0000 mg | ORAL_TABLET | Freq: Every day | ORAL | 2 refills | Status: DC

## 2020-07-10 MED ORDER — BUPROPION HCL ER (XL) 150 MG PO TB24: 150.0000 mg | ORAL_TABLET | Freq: Every day | ORAL | 3 refills | Status: DC

## 2020-07-10 NOTE — Progress Notes (Signed)
Va [place]well [id number] [birth year] 70 y.o.  Subjective:   Patient ID:  Blake Carter is a 71 y.o. (DOB [birth year]) male.  Chief Complaint: No chief complaint on file.   HPI Blake Carter presents to the office today for follow-up of MDD, GAD, OCD.  Describes mood today as "ok". Pleasant. Mood symptoms - reports depression - "a little down". Plans to restart Wellbutrin XL 150mg  daily. Denies anxiety and irritability. Decreased worry and rumination. Stating "I need to get up and get going". Followed by Urology for prostate. Stable interest and motivation. Taking medications as prescribed.  Energy levels decreased. Active, does not have a regular exercise routine.  Enjoys some usual interests and activities. Married. Lives with wife of  52 years. Has 2 children - 1 son in [place] and 1 daughter in [place]. Spending time with family. Appetite adequate. Weight loss - 257 pounds. Sleeping difficulties getting to sleep - "can't stop mind from going". Averages 6 to 9 hours once getting to sleep. Napping during the day some days. Focus and concentration stable. Completing tasks. Managing aspects of household. Retired Forensic psychologist and professor.  Denies SI or HI.  Denies AH or VH.   PHQ2-9   Flowsheet Row CARDIAC REHAB PHASE II EXERCISE from 10/02/2014 in [facility] from 07/08/2014 in [facility]  PHQ-2 Total Score 0 0      Review of Systems:  Review of Systems  Musculoskeletal: Negative for gait problem.  Neurological: Negative for tremors.  Psychiatric/Behavioral:       Please refer to HPI    Medications: I have reviewed the patient's current medications.  Current Outpatient Medications  Medication Sig Dispense Refill  . buPROPion (WELLBUTRIN XL) 150 MG 24 hr tablet Take 1 tablet (150 mg total) by mouth daily. 90 tablet 3  . acetaminophen (TYLENOL) 500 MG tablet Take 1,000 mg by mouth  every 6 (six) hours as needed (pain).    Marland Kitchen ALPRAZolam (XANAX) 0.25 MG tablet Take 1 tablet (0.25 mg total) by mouth 2 (two) times daily as needed for anxiety. 60 tablet 0  . apixaban (ELIQUIS) 5 MG TABS tablet Take 1 tablet (5 mg total) by mouth 2 (two) times daily. 180 tablet 1  . Calcium-Phosphorus-Vitamin D (CITRACAL +D3 PO) Take 2 tablets by mouth daily.    . chlorthalidone (HYGROTON) 25 MG tablet Take 0.5 tablets (12.5 mg total) by mouth daily. 45 tablet 3  . diazepam (VALIUM) 5 MG tablet Take 1 tablet (5 mg total) by mouth at bedtime. 30 tablet 2  . fenofibrate (TRICOR) 145 MG tablet TAKE 1 TABLET BY MOUTH EVERY DAY (Patient taking differently: Take 145 mg by mouth daily.) 90 tablet 3  . fexofenadine (ALLEGRA) 180 MG tablet Take 180 mg by mouth daily.    . fluvoxaMINE (LUVOX) 50 MG tablet Take 4 tablets (200 mg total) by mouth every morning. 360 tablet 3  . furosemide (LASIX) 20 MG tablet TAKE 1 TABLET BY MOUTH  DAILY AS NEEDED FOR FLUID OR EDEMA (Patient taking differently: Take 20 mg by mouth daily as needed for edema. TAKE 1 TABLET BY MOUTH  DAILY AS NEEDED FOR FLUID OR EDEMA) 90 tablet 0  . latanoprost (XALATAN) 0.005 % ophthalmic solution Place 1 drop into both eyes at bedtime.     Marland Kitchen leuprolide, 6 Month, (ELIGARD) 45 MG injection Inject 45 mg into the skin every 6 (six) months.    . levothyroxine (SYNTHROID) 175 MCG tablet  Take 175 mcg by mouth See admin instructions. PT TAKES 1 TABLET BY MOUTH DAILY EXCEPT SUNDAYS    . lisinopril (ZESTRIL) 40 MG tablet Take 1 tablet (40 mg total) by mouth daily. 90 tablet 3  . metoprolol succinate (TOPROL-XL) 100 MG 24 hr tablet Take 1 tablet (100 mg total) by mouth daily. Take with or immediately following a meal. 90 tablet 3  . MYRBETRIQ 50 MG TB24 tablet Take 50 mg by mouth daily.    . nitroGLYCERIN (NITROSTAT) 0.4 MG SL tablet PLACE 1 TABLET (0.4 MG TOTAL) UNDER THE TONGUE EVERY 5 (FIVE) MINUTES AS NEEDED FOR CHEST PAIN. 25 tablet 4  . pantoprazole  (PROTONIX) 40 MG tablet Take 2 tablets (80 mg total) by mouth daily. 180 tablet 3  . rosuvastatin (CRESTOR) 40 MG tablet TAKE 1 TABLET BY MOUTH EVERY DAY (Patient taking differently: Take 40 mg by mouth daily.) 90 tablet 3   No current facility-administered medications for this visit.    Medication Side Effects: None  Allergies:  Allergies  Allergen Reactions  . Tagamet [Cimetidine] Other (See Comments)    Unknown - over 30 years ago (2021) - unsure what occurred it may have been hives or flushing  . Atorvastatin     Myalgias on 80mg  dose  . Niacin And Related Other (See Comments)    Flushing     Past Medical History:  Diagnosis Date  . Allergic rhinitis   . Aortic atherosclerosis ([place])   . Arthritis    "a little bit in some of my joints" (06/20/2014)  . Ascending aorta dilatation (HCC)    25mm by chest CTA 05/2020  . Chronic diastolic CHF (congestive heart failure) ([place])   . Coronary artery disease    a. 05/2014 Cath/PCI: LM nl, LAD 40p, 60-70d (FFR 0.80->3.0x24 Synergy DES), D2 40, LCX 90d (2.25x12 Synergy DES), RCA 20-6m, EF 60-65%. b. Cath 02/2017 without acute change, elev LVEDP suggestive of dCHF.  Marland Kitchen Depression   . Dilated aortic root (HCC)    aortic root at 45mm by Chest CTA 11/2019  . ED (erectile dysfunction)   . Environmental allergies    dry cough  . GERD (gastroesophageal reflux disease)   . History of hiatal hernia   . Hydronephrosis of right kidney    a. s/p ureteral stenting in the past.  . Hyperlipidemia    under control  . Hypertension   . Hypothyroidism   . OCD (obsessive compulsive disorder)   . Pacemaker   . PAF (paroxysmal atrial fibrillation) ([place])   . Panic disorder   . PAT (paroxysmal atrial tachycardia) ([place])   . PONV (postoperative nausea and vomiting)    after thyroid surgery no problems in 10 years   . Prostate cancer [facility])    prostate cancer- robotic prostatectomy - followed by radiation because of positive lymph node--and pt on lupron  injections  . PVC's (premature ventricular contractions)   . Sinus bradycardia    a. chronic, asymptomatic.  24 hour Holter 10/2016 showed Sinus bradycardia, sinus arrhythmia and normal sinus rhythm with average heart rate 50bpm and heart rate ranged from 32 to 100bpm.  . SUI (stress urinary incontinence), male    S/P ROBOTIC PROSTATECTOMY AND RADIATION TX    Family History  Problem Relation Age of Onset  . Cancer Father   . Hyperlipidemia Father   . Heart disease Father        Father had CABG/aorta repair by the time he was in his 72s  . CAD  Brother        Brother who is 9 years younger has had some sort of stenting    Social History   Socioeconomic History  . Marital status: Married    Spouse name: Not on file  . Number of children: Not on file  . Years of education: Not on file  . Highest education level: Not on file  Occupational History  . Not on file  Tobacco Use  . Smoking status: Never Smoker  . Smokeless tobacco: Never Used  Vaping Use  . Vaping Use: Never used  Substance and Sexual Activity  . Alcohol use: Yes    Alcohol/week: 0.0 standard drinks    Comment: 06/20/2014 "1-2 drinks a couple times/month"  . Drug use: No  . Sexual activity: Yes  Other Topics Concern  . Not on file  Social History Narrative  . Not on file   Social Determinants of Health   Financial Resource Strain: Not on file  Food Insecurity: Not on file  Transportation Needs: Not on file  Physical Activity: Not on file  Stress: Not on file  Social Connections: Not on file  Intimate Partner Violence: Not on file    Past Medical History, Surgical history, Social history, and Family history were reviewed and updated as appropriate.   Please see review of systems for further details on the patient's review from today.   Objective:   Physical Exam:  There were no vitals taken for this visit.  Physical Exam Constitutional:      General: He is not in acute distress. Musculoskeletal:         General: No deformity.  Neurological:     Mental Status: He is alert and oriented to person, place, and time.     Coordination: Coordination normal.  Psychiatric:        Attention and Perception: Attention and perception normal. He does not perceive auditory or visual hallucinations.        Mood and Affect: Mood normal. Mood is not anxious or depressed. Affect is not labile, blunt, angry or inappropriate.        Speech: Speech normal.        Behavior: Behavior normal.        Thought Content: Thought content normal. Thought content is not paranoid or delusional. Thought content does not include homicidal or suicidal ideation. Thought content does not include homicidal or suicidal plan.        Cognition and Memory: Cognition and memory normal.        Judgment: Judgment normal.     Comments: Insight intact     Lab Review:     Component Value Date/Time   NA 136 07/08/2020 0826   NA 140 07/03/2020 1146   K 4.1 07/08/2020 0826   CL 102 07/03/2020 1146   CO2 27 07/03/2020 1146   GLUCOSE 101 (H) 07/03/2020 1146   GLUCOSE 98 10/31/2018 1247   BUN 36 (H) 07/03/2020 1146   CREATININE 1.23 07/03/2020 1146   CREATININE 0.92 10/31/2018 1247   CREATININE 1.07 10/14/2015 0850   CALCIUM 10.1 07/03/2020 1146   PROT 7.0 03/28/2020 0908   ALBUMIN 4.7 03/28/2020 0908   AST 21 03/28/2020 0908   AST 19 10/31/2018 1247   ALT 27 03/28/2020 0908   ALT 25 10/31/2018 1247   ALKPHOS 71 03/28/2020 0908   BILITOT 0.2 03/28/2020 0908   BILITOT 0.3 10/31/2018 1247   GFRNONAA 60 07/03/2020 1146   GFRNONAA >60 10/31/2018 1247  GFRAA 69 07/03/2020 1146   GFRAA >60 10/31/2018 1247       Component Value Date/Time   WBC 5.5 07/03/2020 1146   WBC 6.1 12/26/2019 0931   RBC 4.07 (L) 07/03/2020 1146   RBC 4.11 (L) 12/26/2019 0931   HGB 10.5 (L) 07/08/2020 0826   HGB 11.4 (L) 07/03/2020 1146   HCT 31.0 (L) 07/08/2020 0826   HCT 35.1 (L) 07/03/2020 1146   PLT 280 07/03/2020 1146   MCV 86  07/03/2020 1146   MCH 28.0 07/03/2020 1146   MCH 29.1 10/31/2018 1247   MCHC 32.5 07/03/2020 1146   MCHC 33.3 12/26/2019 0931   RDW 14.5 07/03/2020 1146   LYMPHSABS 1.4 12/26/2019 0931   LYMPHSABS 1.7 05/07/2019 1319   MONOABS 0.6 12/26/2019 0931   EOSABS 0.3 12/26/2019 0931   EOSABS 0.2 05/07/2019 1319   BASOSABS 0.0 12/26/2019 0931   BASOSABS 0.0 05/07/2019 1319    No results found for: POCLITH, LITHIUM   No results found for: PHENYTOIN, PHENOBARB, VALPROATE, CBMZ   .res Assessment: Plan:    Plan:  PDMP reviewed  1. Luvox 200mg  daily 2. Valium 5mg  at hs - for sleepless nights 3. Xanax 0.5mg  daily 4. Restart Wellbutrin XL 150mg  every morning.   Consider therapy  RTC 6 months  Patient advised to contact office with any questions, adverse effects, or acute worsening in signs and symptoms.  Discussed potential benefits, risk, and side effects of benzodiazepines to include potential risk of tolerance and dependence, as well as possible drowsiness.  Advised patient not to drive if experiencing drowsiness and to take lowest possible effective dose to minimize risk of dependence and tolerance.   Diagnoses and all orders for this visit:  Insomnia, unspecified type -     diazepam (VALIUM) 5 MG tablet; Take 1 tablet (5 mg total) by mouth at bedtime.  Major depressive disorder, recurrent episode, moderate (HCC) -     buPROPion (WELLBUTRIN XL) 150 MG 24 hr tablet; Take 1 tablet (150 mg total) by mouth daily.  Obsessive-compulsive disorder, unspecified type -     fluvoxaMINE (LUVOX) 50 MG tablet; Take 4 tablets (200 mg total) by mouth every morning.  Generalized anxiety disorder -     fluvoxaMINE (LUVOX) 50 MG tablet; Take 4 tablets (200 mg total) by mouth every morning. -     buPROPion (WELLBUTRIN XL) 150 MG 24 hr tablet; Take 1 tablet (150 mg total) by mouth daily.     Please see After Visit Summary for patient specific instructions.  Future Appointments  Date Time  Provider [place]  07/22/2020 11:40 AM Sueanne Margarita, MD [facility]  07/31/2020  8:30 AM [facility] PHARMACIST [facility]  08/18/2020  7:05 AM [facility] DEVICE REMOTES [facility]  10/06/2020  7:45 AM [facility] LAB [facility]  11/17/2020  7:05 AM [facility] DEVICE REMOTES [facility]  02/16/2021  7:05 AM [facility] DEVICE REMOTES [facility]    No orders of the defined types were placed in this encounter.   -------------------------------

## 2020-07-17 ENCOUNTER — Telehealth: Payer: Self-pay | Admitting: Adult Health

## 2020-07-17 ENCOUNTER — Other Ambulatory Visit: Payer: Self-pay | Admitting: Adult Health

## 2020-07-17 DIAGNOSIS — F411 Generalized anxiety disorder: Secondary | ICD-10-CM

## 2020-07-17 MED ORDER — ALPRAZOLAM 0.25 MG PO TABS: 0.2500 mg | ORAL_TABLET | Freq: Two times a day (BID) | ORAL | 2 refills | Status: AC | PRN

## 2020-07-17 NOTE — Telephone Encounter (Signed)
Pt called to report the 2 Xanax .25 mg is working great. Send Rx to [facility]. Apt 8/10

## 2020-07-17 NOTE — Telephone Encounter (Signed)
Script sent  

## 2020-07-18 ENCOUNTER — Other Ambulatory Visit: Payer: Self-pay | Admitting: Adult Health

## 2020-07-18 ENCOUNTER — Telehealth: Payer: Self-pay | Admitting: Adult Health

## 2020-07-18 DIAGNOSIS — F411 Generalized anxiety disorder: Secondary | ICD-10-CM

## 2020-07-18 DIAGNOSIS — F429 Obsessive-compulsive disorder, unspecified: Secondary | ICD-10-CM

## 2020-07-18 MED ORDER — FLUVOXAMINE MALEATE 100 MG PO TABS: 100.0000 mg | ORAL_TABLET | Freq: Two times a day (BID) | ORAL | 3 refills | Status: DC

## 2020-07-18 NOTE — Telephone Encounter (Signed)
Which pharmancy?

## 2020-07-18 NOTE — Telephone Encounter (Signed)
[facility] [address]. Patient also said a prescription for his xanax was ready and he picked that up as well as the luvox and is good for a couple days.

## 2020-07-18 NOTE — Telephone Encounter (Signed)
Patient called in regards to his medication Luvox. He states that his insurance will only pay for 3 tablets per day. His is currently taking 200mg , and would like to know if you could rewrite a prescription for 2 100mg  per day instead of 4 50mg  tablets a day.

## 2020-07-21 DIAGNOSIS — I10 Essential (primary) hypertension: Secondary | ICD-10-CM | POA: Diagnosis not present

## 2020-07-21 DIAGNOSIS — E890 Postprocedural hypothyroidism: Secondary | ICD-10-CM | POA: Diagnosis not present

## 2020-07-21 DIAGNOSIS — R7309 Other abnormal glucose: Secondary | ICD-10-CM | POA: Diagnosis not present

## 2020-07-22 ENCOUNTER — Ambulatory Visit: Payer: Medicare PPO | Admitting: Cardiology

## 2020-07-22 ENCOUNTER — Other Ambulatory Visit: Payer: Self-pay

## 2020-07-22 ENCOUNTER — Encounter: Payer: Self-pay | Admitting: Cardiology

## 2020-07-22 VITALS — BP 100/64 | HR 64 | Ht 71.0 in | Wt 263.0 lb

## 2020-07-22 DIAGNOSIS — I2510 Atherosclerotic heart disease of native coronary artery without angina pectoris: Secondary | ICD-10-CM

## 2020-07-22 DIAGNOSIS — I5032 Chronic diastolic (congestive) heart failure: Secondary | ICD-10-CM

## 2020-07-22 DIAGNOSIS — I480 Paroxysmal atrial fibrillation: Secondary | ICD-10-CM | POA: Diagnosis not present

## 2020-07-22 DIAGNOSIS — R29898 Other symptoms and signs involving the musculoskeletal system: Secondary | ICD-10-CM

## 2020-07-22 DIAGNOSIS — I2583 Coronary atherosclerosis due to lipid rich plaque: Secondary | ICD-10-CM

## 2020-07-22 DIAGNOSIS — I700 Atherosclerosis of aorta: Secondary | ICD-10-CM

## 2020-07-22 DIAGNOSIS — I10 Essential (primary) hypertension: Secondary | ICD-10-CM

## 2020-07-22 DIAGNOSIS — E7800 Pure hypercholesterolemia, unspecified: Secondary | ICD-10-CM

## 2020-07-22 DIAGNOSIS — I495 Sick sinus syndrome: Secondary | ICD-10-CM

## 2020-07-22 MED ORDER — LISINOPRIL 20 MG PO TABS: 20.0000 mg | ORAL_TABLET | Freq: Every day | ORAL | 3 refills | Status: DC

## 2020-07-22 NOTE — Patient Instructions (Signed)
Medication Instructions:  Your physician has recommended you make the following change in your medication: 1: decreased Lisinopril 20 mg daily *If you need a refill on your cardiac medications before your next appointment, please call your pharmacy*   Lab Work: Fasting  Lipid panel and ALT 09/2020 If you have labs (blood work) drawn today and your tests are completely normal, you will receive your results only by: Marland Kitchen MyChart Message (if you have MyChart) OR . A paper copy in the mail If you have any lab test that is abnormal or we need to change your treatment, we will call you to review the results.   Testing/Procedures: Your physician has requested that you have a lower  extremity arterial duplex. This test is an ultrasound of the arteries in the legs.  It looks at arterial blood flow in the legs. Allow one hour for Lower  Arterial scans. There are no restrictions or special instructions   Follow-Up: At [facility], you and your health needs are our priority.  As part of our continuing mission to provide you with exceptional heart care, we have created designated Provider Care Teams.  These Care Teams include your primary Cardiologist (physician) and Advanced Practice Providers (APPs -  Physician Assistants and Nurse Practitioners) who all work together to provide you with the care you need, when you need it.  We recommend signing up for the patient portal called "MyChart".  Sign up information is provided on this After Visit Summary.  MyChart is used to connect with patients for Virtual Visits (Telemedicine).  Patients are able to view lab/test results, encounter notes, upcoming appointments, etc.  Non-urgent messages can be sent to your provider as well.   To learn more about what you can do with MyChart, go to [email or web address].    Your next appointment:   6 month(s)  The format for your next appointment:   In Person  Provider:   You may see Fransico Him, MD or one of the  following Advanced Practice Providers on your designated Care Team:    Melina Copa, PA-C  Ermalinda Barrios, PA-C    Other Instructions Check Blood Pressure daily for a week, and call us with the list of readings

## 2020-07-22 NOTE — Addendum Note (Signed)
Addended by: Edman Circle on: 07/22/2020 12:13 PM   Modules accepted: Orders

## 2020-07-22 NOTE — Progress Notes (Signed)
Cardiology Office Note    Date:  07/22/2020   ID:  Blake Carter, DOB [birth year], MRN [id number]  PCP:  Hulan Fess, MD  Cardiologist:  Fransico Him, MD  Electrophysiologist:  Cristopher Peru, MD   Chief Complaint: f/u PAF, CAD< HTN, HLD  History of Present Illness:   Blake Carter is a 70 y.o. 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  Last cath was in 2018 showing patent stents, moderately elevated LVEDP. He underwent pacemaker implantation in 2020 for progressive bradycardia. Last echo 11/2019 EF 60-65%, grade 1 DD, mild AI (normal valve structure), moderate dilation of ascending aorta (81mm) with CTA showing 4.4cm.  Was seen in the office 01/30/20 with episodic palpitations associated with SOB and chest discomfort. He wore a monitor showing NSR/ST average HR 67bpm, nonsustained atrial tachycardia, intermittent V pacing, occsaional PVCs, bigeminal PVCs, trigeminal PVCs and ventricular couplets and triplets, and wide complex tachycardia up to 5 beats. He was started on metoprolol. Nuclear stress test was normal. It does appear that on pacemaker interrogation on 01/26/20 the patient had actually had an episode of atrial fibrillation (prior to event monitor) therefore he was started on anticoagulation. He had itching with Eliquis so was changed to Xarelto.  When I saw him last he was having problems with exertional angina and was admitted to [facility] and ruled out for MI.  He underwent cath showing widely patent LCx and LAD stents, normal RCA and LM and diffuse nonobstructive disease of the PDA.  There was a 30% stenosis prior to the mid LAD stent and 25-30% in distal  LCx.  FFR was normal with no evidence of microvascular disease and LVEDP was normal. Medical management was recommended.    He is here today for followup and is doing well.  he denies any chest pain or pressure,  PND, orthopnea, LE edema, dizziness, palpitations or syncope.  He still complains of DOE but has joined a gym and tried to walk but if he walks over .5 miles his legs get very weak.  He says that he feels weak and his legs feel like "jello".  He says that his BP has been low and he stopped taking the chlorthalidone on Saturday and he has felt better and less weak. Prior to that he was having lightheadedness to the point he thought he may fall.  He is compliant with his meds and is tolerating meds with no SE.  TSH was normal 2.5 inDec 2021 but was 14 at the lab yesterday and he sees his Endocrinology next week.   Past Medical History:  Diagnosis Date  . Allergic rhinitis   . Aortic atherosclerosis ([place])   . Arthritis    "a little bit in some of my joints" (06/20/2014)  . Ascending aorta dilatation (HCC)    70mm by chest CTA 05/2020  . Chronic diastolic CHF (congestive heart failure) ([place])   . Coronary artery disease    a. 05/2014 Cath/PCI: LM nl, LAD 40p, 60-70d (FFR 0.80->3.0x24 Synergy DES), D2 40, LCX 90d (2.25x12 Synergy DES), RCA 20-2m, EF 60-65%. b. Cath 02/2017 without acute change, elev LVEDP suggestive of dCHF.  Marland Kitchen Depression   . Dilated aortic root (HCC)    aortic root at 68mm  by Chest CTA 11/2019  . ED (erectile dysfunction)   . Environmental allergies    dry cough  . GERD (gastroesophageal reflux disease)   . History of hiatal hernia   . Hydronephrosis of right kidney    a. s/p ureteral stenting in the past.  . Hyperlipidemia    under control  . Hypertension   . Hypothyroidism   . OCD (obsessive compulsive disorder)   . Pacemaker   . PAF (paroxysmal atrial fibrillation) ([place])   . Panic disorder   . PAT (paroxysmal atrial tachycardia) ([place])   . PONV (postoperative  nausea and vomiting)    after thyroid surgery no problems in 10 years   . Prostate cancer [facility])    prostate cancer- robotic prostatectomy - followed by radiation because of positive lymph node--and pt on lupron injections  . PVC's (premature ventricular contractions)   . Sinus bradycardia    a. chronic, asymptomatic.  24 hour Holter 10/2016 showed Sinus bradycardia, sinus arrhythmia and normal sinus rhythm with average heart rate 50bpm and heart rate ranged from 32 to 100bpm.  . SUI (stress urinary incontinence), male    S/P ROBOTIC PROSTATECTOMY AND RADIATION TX    Past Surgical History:  Procedure Laterality Date  . COLONOSCOPY  07/2013   Dr. Oletta Lamas  . CORONARY ANGIOPLASTY WITH STENT PLACEMENT  06/20/2014   "2"  . CYSTOSCOPY  11/30/2011   Procedure: CYSTOSCOPY;  Surgeon: Reece Packer, MD;  Location: [facility];  Service: Urology;  Laterality: N/A;  Inplantation of Artificial Sphincter and Cystoscopy  . CYSTOSCOPY WITH RETROGRADE PYELOGRAM, URETEROSCOPY AND STENT PLACEMENT Right 02/11/2014   Procedure: CYSTOSCOPY WITH RETROGRADE PYELOGRAM, URETEROSCOPY ,URETERAL BIOPSY AND STENT PLACEMENT;  Surgeon: Raynelle Bring, MD;  Location: [facility];  Service: Urology;  Laterality: Right;  . CYSTOSCOPY WITH RETROGRADE PYELOGRAM, URETEROSCOPY AND STENT PLACEMENT Right 04/15/2014   Procedure: CYSTOSCOPY WITH RETROGRADE PYELOGRAM, URETEROSCOPY, BIOPSY AND STENT PLACEMENT;  Surgeon: Raynelle Bring, MD;  Location: [facility];  Service: Urology;  Laterality: Right;  . INTRAVASCULAR PRESSURE WIRE/FFR STUDY N/A 07/08/2020   Procedure: INTRAVASCULAR PRESSURE WIRE/FFR STUDY;  Surgeon: Belva Crome, MD;  Location: [place] [facility];  Service: Cardiovascular;  Laterality: N/A;  . LEFT HEART CATH AND CORONARY ANGIOGRAPHY N/A 03/29/2017   Procedure: LEFT HEART CATH AND CORONARY ANGIOGRAPHY;  Surgeon: Leonie Man, MD;  Location: [place] [facility];  Service: Cardiovascular;  Laterality: N/A;  . LEFT HEART  CATHETERIZATION WITH CORONARY ANGIOGRAM N/A 06/20/2014   Procedure: LEFT HEART CATHETERIZATION WITH CORONARY ANGIOGRAM;  Surgeon: Leonie Man, MD;  Location: [facility] CATH LAB;  Service: Cardiovascular;  Laterality: N/A;  . PACEMAKER IMPLANT N/A 05/21/2019   Procedure: PACEMAKER IMPLANT;  Surgeon: Evans Lance, MD;  Location: [place] CV LAB;  Service: Cardiovascular;  Laterality: N/A;  . PERCUTANEOUS CORONARY STENT INTERVENTION (PCI-S)  06/20/2014   Procedure: PERCUTANEOUS CORONARY STENT INTERVENTION (PCI-S);  Surgeon: Leonie Man, MD;  Location: [facility] CATH LAB;  Service: Cardiovascular;;  LAD and Circumflex  . REFRACTIVE SURGERY Bilateral 2004  . RIGHT/LEFT HEART CATH AND CORONARY ANGIOGRAPHY N/A 07/08/2020   Procedure: RIGHT/LEFT HEART CATH AND CORONARY ANGIOGRAPHY;  Surgeon: Belva Crome, MD;  Location: [place] [facility];  Service: Cardiovascular;  Laterality: N/A;  . ROBOT ASSISTED LAPAROSCOPIC RADICAL PROSTATECTOMY  05/2010  . THYROIDECTOMY, PARTIAL  10/1974  . TONSILLECTOMY  1956   . TOTAL THYROIDECTOMY  10/2012   "nuked it"  . UPPER GI ENDOSCOPY     food impaction  2009 done by Dr Oletta Lamas  . URINARY SPHINCTER IMPLANT  11/30/2011   Procedure: ARTIFICIAL URINARY SPHINCTER;  Surgeon: Reece Packer, MD;  Location: [facility];  Service: Urology;  Laterality: N/A;    Current Medications: Current Meds  Medication Sig  . acetaminophen (TYLENOL) 500 MG tablet Take 1,000 mg by mouth every 6 (six) hours as needed (pain).  . Cholecalciferol (VITAMIN D3) 2000 units TABS Take 6,000 Units by mouth 3 (three) times a week.   . diazepam (VALIUM) 5 MG tablet Take 1 tablet (5 mg total) by mouth at bedtime.  . fenofibrate (TRICOR) 145 MG tablet Take 1 tablet (145 mg total) by mouth daily.  . fexofenadine (ALLEGRA) 180 MG tablet Take 180 mg by mouth daily.  . fluvoxaMINE (LUVOX) 50 MG tablet Take 200 mg by mouth every morning.   . furosemide (LASIX) 20 MG tablet TAKE 1 TABLET BY MOUTH  DAILY AS NEEDED  FOR FLUID  OR EDEMA  . latanoprost (XALATAN) 0.005 % ophthalmic solution Place 1 drop into both eyes at bedtime.   Marland Kitchen leuprolide, 6 Month, (ELIGARD) 45 MG injection Inject 45 mg into the skin every 6 (six) months.  . levothyroxine (SYNTHROID) 175 MCG tablet PT TAKES 1 TABLET BY MOUTH DAILY EXCEPT SUNDAYS  . lisinopril (ZESTRIL) 10 MG tablet Take 1 tablet by mouth daily.  Marland Kitchen MYRBETRIQ 50 MG TB24 tablet Take 50 mg by mouth daily.  . nitroGLYCERIN (NITROSTAT) 0.4 MG SL tablet PLACE 1 TABLET (0.4 MG TOTAL) UNDER THE TONGUE EVERY 5 (FIVE) MINUTES AS NEEDED FOR CHEST PAIN.  Marland Kitchen pantoprazole (PROTONIX) 40 MG tablet Take 2 tablets (80 mg total) by mouth daily.  . rivaroxaban (XARELTO) 20 MG TABS tablet Take 1 tablet (20 mg total) by mouth daily with supper.  Marland Kitchen amLODipine (NORVASC) 10 MG tablet Take 1 tablet (10 mg total) by mouth every evening.  . metoprolol succinate (TOPROL XL) 25 MG 24 hr tablet Take 1 tablet (25 mg total) by mouth daily.        Allergies:   Tagamet [cimetidine], Atorvastatin, and Niacin and related   Social History   Socioeconomic History  . Marital status: Married    Spouse name: Not on file  . Number of children: Not on file  . Years of education: Not on file  . Highest education level: Not on file  Occupational History  . Not on file  Tobacco Use  . Smoking status: Never Smoker  . Smokeless tobacco: Never Used  Vaping Use  . Vaping Use: Never used  Substance and Sexual Activity  . Alcohol use: Yes    Alcohol/week: 0.0 standard drinks    Comment: 06/20/2014 "1-2 drinks a couple times/month"  . Drug use: No  . Sexual activity: Yes  Other Topics Concern  . Not on file  Social History Narrative  . Not on file   Social Determinants of Health   Financial Resource Strain: Not on file  Food Insecurity: Not on file  Transportation Needs: Not on file  Physical Activity: Not on file  Stress: Not on file  Social Connections: Not on file     Family History:  The  patient's family history includes CAD in his brother; Cancer in his father; Heart disease in his father; Hyperlipidemia in his father.  ROS:   Please see the history of present illness. Denies any unusual bleeding on Eliquis. All other systems are reviewed and otherwise negative.    EKGs/Labs/Other Studies Reviewed:    Studies reviewed are  outlined and summarized above. Reports included below if pertinent.  Cardiac Cath 07/08/2020 Conclusion   Widely patent LAD and circumflex stents.  Widely patent left main  Widely patent circumflex.  The left anterior descending contains 30% stenosis proximal to the mid stent.  Hemodynamic assessment was performed with the Pressure Wire X demonstrating an RFR of 0.9 and an FFR of 0.82.  A full hemodynamic assessment to exclude microvascular dysfunction was performed.    Right coronary is widely patent with luminal irregularities.  There is diffuse disease in the PDA but without focal high-grade obstruction.  Left ventricular size is normal.  EF is 55%.  EDP is normal.  Right heart pressures are normal.  RECOMMENDATIONS:   No evidence of significant epicardial disease or microvascular dysfunction.  Continue aggressive risk factor modification.  Initiate an exercise program and weight loss.    2D echo 713/21 IMPRESSIONS    1. Left ventricular ejection fraction, by estimation, is 60 to 65%. The  left ventricle has normal function. The left ventricle has no regional  wall motion abnormalities. Left ventricular diastolic parameters are  consistent with Grade I diastolic  dysfunction (impaired relaxation).  2. Right ventricular systolic function is normal. The right ventricular  size is normal. There is normal pulmonary artery systolic pressure.  3. The mitral valve is normal in structure. No evidence of mitral valve  regurgitation. No evidence of mitral stenosis.  4. The aortic valve is normal in structure. Aortic valve  regurgitation is  mild. No aortic stenosis is present.  5. Aortic dilatation noted. There is moderate dilatation of the ascending  aorta measuring 46 mm.  6. The inferior vena cava is normal in size with greater than 50%  respiratory variability, suggesting right atrial pressure of 3 mmHg.   Comparison(s): Prior ECHO 42 mm ascending aorta. Current 45 mm. Consider  CTA of aorta for comparison.     EKG:  EKG is not ordered today.  Recent Labs: 01/30/2020: TSH 6.200 03/28/2020: ALT 27 07/03/2020: BUN 36; Creatinine, Ser 1.23; Platelets 280 07/08/2020: Hemoglobin 10.5; Potassium 4.1; Sodium 136  Recent Lipid Panel    Component Value Date/Time   CHOL 120 10/22/2019 0807   TRIG 169 (H) 10/22/2019 0807   HDL 45 10/22/2019 0807   CHOLHDL 2.7 10/22/2019 0807   CHOLHDL 3.8 03/29/2017 0554   VLDL 34 03/29/2017 0554   LDLCALC 47 10/22/2019 0807    PHYSICAL EXAM:    VS:  BP 100/64   Pulse 64   Ht 5\' 11"  (1.803 m)   Wt 263 lb (119.3 kg)   SpO2 95%   BMI 36.68 kg/m   BMI: Body mass index is 36.68 kg/m.  GEN: Well nourished, well developed in no acute distress HEENT: Normal NECK: No JVD; No carotid bruits LYMPHATICS: No lymphadenopathy CARDIAC:RRR, no murmurs, rubs, gallops RESPIRATORY:  Clear to auscultation without rales, wheezing or rhonchi  ABDOMEN: Soft, non-tender, non-distended MUSCULOSKELETAL:  No edema; No deformity  SKIN: Warm and dry NEUROLOGIC:  Alert and oriented x 3 PSYCHIATRIC:  Normal affect    Wt Readings from Last 3 Encounters:  07/22/20 263 lb (119.3 kg)  07/08/20 255 lb (115.7 kg)  07/01/20 256 lb (116.1 kg)     ASSESSMENT & PLAN:    Paroxysmal atrial fibrillation  -he is maintaining NSR and denies any anginal symptoms -continue on apixaban 5mg  BID and Toprol XL 100mg  daily -denies any bleeding problems on the DOAC -Hbg 13.5  And SCr 9 on 06/30/2020  ASCAD  -  he recently underwent cardiac cath for exertional angina which showed widely patent LAD and  LCX stents with 30% stenosis proximal to mid LAD stent and diffuse nonobstructive disease in the PDA with 25-30% distal LCx , no other CAD and no evidence of microvascular disease.  LVEDP was normal  Essential HTN  -BP adequately controlled on exam today but is soft and has been running low recently -continue Toprol 100mg  daily to suppress PAF -decrease Lisinopril to 20mg  daily due to soft BP -I have asked him to check his BP daily for a week and call with results -SCR stable at 1.29 -continue chlo Hyperlipidemia  -last LDL 47 in May 2021 -LDL was 47 in May 2021 -continue Crestor 40mg  daily and fenofibrate 145mg  daily -check FLP and ALT in May 2022  Aortic atherosclerosis/Dilated ascending aorta  -11mm by CTA 05/2020 -continue statin -BP adequately controlled  Chronic diastolic CHF  -he appears euvolemic on exam -LVEPD was normal on cath  -only has to use Lasix sporadically.   SSS/Bradycardia -s/p PPM followed by Dr. Lovena Le  Fatigue and leg weakness -his TSH is now elevated at 14 and I suspect this is causing a lot of his sx -he is seeing Endocrine next week and I encouraged to go ahead and call them today to let them know his sx -I will get LE arterial dopplers to rule out PVD   Signed, Fransico Him, MD  07/22/2020 11:51 AM    [place], [place], [place]  [address] Phone: [phone]; Fax: [phone]

## 2020-07-23 ENCOUNTER — Ambulatory Visit: Payer: Medicare PPO | Admitting: Primary Care

## 2020-07-23 ENCOUNTER — Encounter: Payer: Self-pay | Admitting: Primary Care

## 2020-07-23 DIAGNOSIS — E890 Postprocedural hypothyroidism: Secondary | ICD-10-CM | POA: Diagnosis not present

## 2020-07-23 DIAGNOSIS — R0600 Dyspnea, unspecified: Secondary | ICD-10-CM | POA: Diagnosis not present

## 2020-07-23 DIAGNOSIS — R0609 Other forms of dyspnea: Secondary | ICD-10-CM

## 2020-07-23 MED ORDER — ALBUTEROL SULFATE HFA 108 (90 BASE) MCG/ACT IN AERS: 1.0000 | INHALATION_SPRAY | Freq: Four times a day (QID) | RESPIRATORY_TRACT | 0 refills | Status: DC | PRN

## 2020-07-23 NOTE — Assessment & Plan Note (Signed)
-   Overall this symptoms are mild and occur only with moderate exertion such as climbing stairs or hills. His cardiac and pulmonary testing have been unrevealing. HRCT showed no evidence of ILD, scarring right lung base from previous infection. Eosinophils and IGE were slightly elevated; PFTs in 2018 were normal with some evidence of air trapping. Recommend trial Albuterol hfa 2 puffs every 6 hours as needed for shortness of breath/wheezing. Encourage patient work on weight loss and increase physical activity level. Return as needed if symptoms worsen.

## 2020-07-23 NOTE — Patient Instructions (Signed)
Pleasure meeting you today  High-resolution CAT scan showed no evidence of fibrotic interstitial lung disease, you had some scarring at right lung base consistent with prior infection or aspiration  Echocardiogram showed normal ejection fraction with grade 1 diastolic dysfunction (heart failure)  Labs showed slightly elevated allergy markers with eosinophils of 300 and IgE 42  Function testing in 2018 showed mild restriction/air trapping.  Overall they were normal.  No evidence of COPD or over asthma as you do not have significant bronchodilator response  Recommendations - Continue Allegra daily - Use Flonase nasal spray as needed for congestion - You can use albuterol rescue inhaler 2 puffs every 4-6 hours as needed for breakthrough shortness of breath or wheezing - Continue to work on weight loss efforts and increasing physical activity level  Follow-up - As needed with our office if symptoms worsen

## 2020-07-23 NOTE — Progress Notes (Signed)
@Patient  ID: Blake Carter, male    DOB: February 07, 1951, 70 y.o.   MRN: [id number]  Chief Complaint  Patient presents with  . Follow-up    Sob improved.     Referring provider: Hulan Fess, MD  HPI: 70 year old male, never smoked.  Past medical history significant for chronic diastolic heart failure, pretension, GERD, hypothyroidism/Graves' disease, prostate cancer.  Patient of Dr. Chase Caller, last seen by pulmonary nurse practitioner on 01/30/2020.    Pulmonary function testing in 2018 was normal, some evidence of air trapping. HRCT showed bandlike scarring in right lung base and lingula mild tubular bronchiectasis, findings most consistent with sequela of prior infection or aspiration.  No evidence of fibrotic interstitial lung disease.  Echocardiogram showed normal ejection fraction with grade 1 diastolic dysfunction. Eosinophil absolute 300, IgE 42.  07/23/2020- Interim hx  Patient presents today for 26-month follow-up.  He reports that he is doing well without significant shortness of breath symptoms. His only real complaint is that he will still get winded with stairs/hills. He does noticed possible wheezing with activity. He takes Human resources officer daily. His activity level is down somewhat. He has put on 10-20 lbs in the last 2 years. He just joined a gym last week and is working on weight loss and increasing physical activity. He had heart cath 2-3 weeks ago which did not show any significant blockages. He is on Eliquis for brief episode of afib last fall. He has not needed to take lasix recently. He will mainly [place] when he notices swelling in his hands. TSH remains elevated, he is compliant with taking Synthroid. CAT score 8.   Tests:   12/26/2019-D-dimer-0.43 12/26/2019-CBC with differential-eosinophils relative 4.3, eosinophils absolute 0.3 12/26/2019-IgE-42  12/27/2019-CT chest high-res-bland appearing bandlike scarring in the right lung base and lingula mild tubular bronchiectasis, findings  are most consistent with sequela of prior infection or aspiration, no evidence of fibrotic interstitial lung disease, unchanged enlargement of tubular ascending thoracic aorta measuring approximately 4.3 x 4.2 cm, coronary artery disease  12/11/2019-echocardiogram-LV ejection fraction 60 to 10%, grade 1 diastolic dysfunction, RV size normal, right ventricular systolic function is normal  Allergies  Allergen Reactions  . Tagamet [Cimetidine] Other (See Comments)    Unknown - over 30 years ago (2021) - unsure what occurred it may have been hives or flushing  . Atorvastatin     Myalgias on 80mg  dose  . Niacin And Related Other (See Comments)    Flushing     Immunization History  Administered Date(s) Administered  . Fluad Quad(high Dose 65+) 03/29/2020  . Influenza, High Dose Seasonal PF 03/13/2019  . Influenza,inj,Quad PF,6+ Mos 03/05/2013  . Influenza-Unspecified 02/28/2014  . PFIZER(Purple Top)SARS-COV-2 Vaccination 06/22/2019, 07/13/2019, 01/13/2020  . Tdap 02/29/2016    Past Medical History:  Diagnosis Date  . Allergic rhinitis   . Aortic atherosclerosis ([place])   . Arthritis    "a little bit in some of my joints" (06/20/2014)  . Ascending aorta dilatation (HCC)    49mm by chest CTA 05/2020  . Chronic diastolic CHF (congestive heart failure) ([place])   . Coronary artery disease    a. 05/2014 Cath/PCI: LM nl, LAD 40p, 60-70d (FFR 0.80->3.0x24 Synergy DES), D2 40, LCX 90d (2.25x12 Synergy DES), RCA 20-69m, EF 60-65%. b. Cath 02/2017 without acute change, elev LVEDP suggestive of dCHF.  Marland Kitchen Depression   . Dilated aortic root (HCC)    aortic root at 72mm by Chest CTA 11/2019  . ED (erectile dysfunction)   . Environmental allergies  dry cough  . GERD (gastroesophageal reflux disease)   . History of hiatal hernia   . Hydronephrosis of right kidney    a. s/p ureteral stenting in the past.  . Hyperlipidemia    under control  . Hypertension   . Hypothyroidism   . OCD (obsessive  compulsive disorder)   . Pacemaker   . PAF (paroxysmal atrial fibrillation) ([place])   . Panic disorder   . PAT (paroxysmal atrial tachycardia) ([place])   . PONV (postoperative nausea and vomiting)    after thyroid surgery no problems in 10 years   . Prostate cancer [facility])    prostate cancer- robotic prostatectomy - followed by radiation because of positive lymph node--and pt on lupron injections  . PVC's (premature ventricular contractions)   . Sinus bradycardia    a. chronic, asymptomatic.  24 hour Holter 10/2016 showed Sinus bradycardia, sinus arrhythmia and normal sinus rhythm with average heart rate 50bpm and heart rate ranged from 32 to 100bpm.  . SUI (stress urinary incontinence), male    S/P ROBOTIC PROSTATECTOMY AND RADIATION TX    Tobacco History: Social History   Tobacco Use  Smoking Status Never Smoker  Smokeless Tobacco Never Used   Counseling given: Not Answered   Outpatient Medications Prior to Visit  Medication Sig Dispense Refill  . acetaminophen (TYLENOL) 500 MG tablet Take 1,000 mg by mouth every 6 (six) hours as needed (pain).    Marland Kitchen ALPRAZolam (XANAX) 0.25 MG tablet Take 1 tablet (0.25 mg total) by mouth 2 (two) times daily as needed for anxiety. 60 tablet 2  . apixaban (ELIQUIS) 5 MG TABS tablet Take 1 tablet (5 mg total) by mouth 2 (two) times daily. 180 tablet 1  . buPROPion (WELLBUTRIN XL) 150 MG 24 hr tablet Take 1 tablet (150 mg total) by mouth daily. 90 tablet 3  . Calcium-Phosphorus-Vitamin D (CITRACAL +D3 PO) Take 2 tablets by mouth daily.    . diazepam (VALIUM) 5 MG tablet Take 1 tablet (5 mg total) by mouth at bedtime. 30 tablet 2  . fenofibrate (TRICOR) 145 MG tablet TAKE 1 TABLET BY MOUTH EVERY DAY (Patient taking differently: Take 145 mg by mouth daily.) 90 tablet 3  . fexofenadine (ALLEGRA) 180 MG tablet Take 180 mg by mouth daily.    . fluvoxaMINE (LUVOX) 100 MG tablet Take 1 tablet (100 mg total) by mouth 2 (two) times daily. 180 tablet 3  . furosemide  (LASIX) 20 MG tablet TAKE 1 TABLET BY MOUTH  DAILY AS NEEDED FOR FLUID OR EDEMA (Patient taking differently: Take 20 mg by mouth daily as needed for edema. TAKE 1 TABLET BY MOUTH  DAILY AS NEEDED FOR FLUID OR EDEMA) 90 tablet 0  . latanoprost (XALATAN) 0.005 % ophthalmic solution Place 1 drop into both eyes at bedtime.     Marland Kitchen leuprolide, 6 Month, (ELIGARD) 45 MG injection Inject 45 mg into the skin every 6 (six) months.    . levothyroxine (SYNTHROID) 175 MCG tablet Take 175 mcg by mouth See admin instructions. PT TAKES 1 TABLET BY MOUTH DAILY EXCEPT SUNDAYS    . lisinopril (ZESTRIL) 20 MG tablet Take 1 tablet (20 mg total) by mouth daily. 90 tablet 3  . metoprolol succinate (TOPROL-XL) 100 MG 24 hr tablet Take 1 tablet (100 mg total) by mouth daily. Take with or immediately following a meal. 90 tablet 3  . MYRBETRIQ 50 MG TB24 tablet Take 50 mg by mouth daily.    . nitroGLYCERIN (NITROSTAT) 0.4 MG SL  tablet PLACE 1 TABLET (0.4 MG TOTAL) UNDER THE TONGUE EVERY 5 (FIVE) MINUTES AS NEEDED FOR CHEST PAIN. 25 tablet 4  . pantoprazole (PROTONIX) 40 MG tablet Take 2 tablets (80 mg total) by mouth daily. 180 tablet 3  . rosuvastatin (CRESTOR) 40 MG tablet TAKE 1 TABLET BY MOUTH EVERY DAY (Patient taking differently: Take 40 mg by mouth daily.) 90 tablet 3  . chlorthalidone (HYGROTON) 25 MG tablet Take 0.5 tablets (12.5 mg total) by mouth daily. (Patient not taking: Reported on 07/22/2020) 45 tablet 3   No facility-administered medications prior to visit.   Review of Systems  Review of Systems  Constitutional: Negative.   HENT: Negative.   Respiratory: Negative for cough, chest tightness and shortness of breath.        DOE; rare wheeze on exertion  Cardiovascular: Negative.  Negative for chest pain and leg swelling.    Physical Exam  BP 112/72 (BP Location: Left Arm, Cuff Size: Large)   Pulse 69   Temp (!) 97.1 F (36.2 C)   Ht 5\' 11"  (1.803 m)   Wt 262 lb (118.8 kg)   SpO2 97%   BMI 36.54  kg/m  Physical Exam Constitutional:      General: He is not in acute distress.    Appearance: Normal appearance. He is obese. He is not ill-appearing.  HENT:     Head: Normocephalic and atraumatic.     Mouth/Throat:     Comments: Deferred d/t masking Cardiovascular:     Rate and Rhythm: Normal rate and regular rhythm.     Comments: No edema Pulmonary:     Effort: Pulmonary effort is normal.     Breath sounds: Normal breath sounds. No wheezing, rhonchi or rales.     Comments: CTA Musculoskeletal:        General: Normal range of motion.  Skin:    General: Skin is warm and dry.  Neurological:     General: No focal deficit present.     Mental Status: He is alert and oriented to person, place, and time. Mental status is at baseline.  Psychiatric:        Mood and Affect: Mood normal.        Behavior: Behavior normal.        Thought Content: Thought content normal.        Judgment: Judgment normal.      Lab Results:  CBC    Component Value Date/Time   WBC 5.5 07/03/2020 1146   WBC 6.1 12/26/2019 0931   RBC 4.07 (L) 07/03/2020 1146   RBC 4.11 (L) 12/26/2019 0931   HGB 10.5 (L) 07/08/2020 0826   HGB 11.4 (L) 07/03/2020 1146   HCT 31.0 (L) 07/08/2020 0826   HCT 35.1 (L) 07/03/2020 1146   PLT 280 07/03/2020 1146   MCV 86 07/03/2020 1146   MCH 28.0 07/03/2020 1146   MCH 29.1 10/31/2018 1247   MCHC 32.5 07/03/2020 1146   MCHC 33.3 12/26/2019 0931   RDW 14.5 07/03/2020 1146   LYMPHSABS 1.4 12/26/2019 0931   LYMPHSABS 1.7 05/07/2019 1319   MONOABS 0.6 12/26/2019 0931   EOSABS 0.3 12/26/2019 0931   EOSABS 0.2 05/07/2019 1319   BASOSABS 0.0 12/26/2019 0931   BASOSABS 0.0 05/07/2019 1319    BMET    Component Value Date/Time   NA 136 07/08/2020 0826   NA 140 07/03/2020 1146   K 4.1 07/08/2020 0826   CL 102 07/03/2020 1146   CO2 27 07/03/2020 1146  GLUCOSE 101 (H) 07/03/2020 1146   GLUCOSE 98 10/31/2018 1247   BUN 36 (H) 07/03/2020 1146   CREATININE 1.23  07/03/2020 1146   CREATININE 0.92 10/31/2018 1247   CREATININE 1.07 10/14/2015 0850   CALCIUM 10.1 07/03/2020 1146   GFRNONAA 60 07/03/2020 1146   GFRNONAA >60 10/31/2018 1247   GFRAA 69 07/03/2020 1146   GFRAA >60 10/31/2018 1247    BNP No results found for: BNP  ProBNP    Component Value Date/Time   PROBNP 64 12/13/2017 1139   PROBNP 99.9 10/05/2012 2150    Imaging: CARDIAC CATHETERIZATION  Result Date: 07/08/2020  Widely patent LAD and circumflex stents.  Widely patent left main  Widely patent circumflex.  The left anterior descending contains 30% stenosis proximal to the mid stent.  Hemodynamic assessment was performed with the Pressure Wire X demonstrating an RFR of 0.9 and an FFR of 0.82.  A full hemodynamic assessment to exclude microvascular dysfunction was performed.  GFR was 3.6 and INR was 12.0.  Right coronary is widely patent with luminal irregularities.  There is diffuse disease in the PDA but without focal high-grade obstruction.  Left ventricular size is normal.  EF is 55%.  EDP is normal.  Right heart pressures are normal. RECOMMENDATIONS:  No evidence of significant epicardial disease or microvascular dysfunction.  Continue aggressive risk factor modification.  Initiate an exercise program and weight loss.  Further management per treatment team.   CT ANGIO CHEST AORTA W/CM & OR WO/CM  Result Date: 06/25/2020 CLINICAL DATA:  Follow-up thoracic aortic aneurysm. EXAM: CT ANGIOGRAPHY CHEST WITH CONTRAST TECHNIQUE: Multidetector CT imaging of the chest was performed using the standard protocol during bolus administration of intravenous contrast. Multiplanar CT image reconstructions and MIPs were obtained to evaluate the vascular anatomy. CONTRAST:  26mL OMNIPAQUE IOHEXOL 350 MG/ML SOLN COMPARISON:  12/27/2019; 12/07/2018; 12/11/2013 FINDINGS: Vascular Findings: Stable mild fusiform aneurysmal dilatation of the ascending thoracic aorta with measurements as follows.  The thoracic aorta tapers to a normal caliber at the level of the aortic arch. The descending thoracic aorta is of normal caliber. No evidence of thoracic aortic dissection or periaortic stranding on this nongated examination. Conventional configuration of the aortic arch. The branch vessels of the aortic arch appear widely patent throughout their imaged courses. Normal heart size. Coronary artery calcifications. Left anterior chest wall pacemaker with lead tips terminating right atrium and ventricle. Although this examination was not tailored for the evaluation the pulmonary arteries, there are no discrete filling defects within the central pulmonary arterial tree to suggest central pulmonary embolism. Normal caliber of the main pulmonary artery. ------------------------------------------------------------- Thoracic aortic measurements: Sinotubular junction 38 mm as measured in greatest oblique short axis coronal dimension. Proximal ascending aorta 42 mm as measured in greatest oblique short axis axial dimension at the level of the main pulmonary artery (axial image 44, series 4) and approximately 43 mm in greatest oblique short axis coronal diameter (coronal image 64, series 7), grossly unchanged compared to the 11/2013 examination. Aortic arch aorta 32 mm as measured in greatest oblique short axis sagittal dimension. Proximal descending thoracic aorta 28 mm as measured in greatest oblique short axis axial dimension at the level of the main pulmonary artery. Distal descending thoracic aorta 26 mm as measured in greatest oblique short axis axial dimension at the level of the diaphragmatic hiatus. Review of the MIP images confirms the above findings. ------------------------------------------------------------- Non-Vascular Findings: Mediastinum/Lymph Nodes: Scattered mediastinal and right hilar lymph nodes appear morphologically similar to the  11/2018 examination with index precarinal lymph node measuring 1.2 cm in  greatest short axis diameter (image 37, series 4 and index right suprahilar lymph node measuring 1.3 cm (image 40, series 4), presumably reactive in etiology. No bulky left hilar adenopathy. No axillary lymphadenopathy. Lungs/Pleura: Punctate (sub 4 mm) nodules within the right upper (image 23, series 5) and lower (image 34, series 5) lobes a grossly unchanged compared to the 11/2019 examination though not seen on more remote examinations. Punctate granuloma seen within right lower lobe (image 49, series 5). No definitive new pulmonary nodules. Minimal subsegmental atelectasis within the right lower lobe and within the bilateral costophrenic angles. No discrete focal airspace opacities. No pleural effusion or pneumothorax. The central pulmonary airways mild nodular thickening involving the posterior aspect of the trachea (image 21, series 5, similar to the 11/2018 examination. Central pulmonary airways appear widely patent. Upper abdomen: Limited early arterial phase evaluation of the upper abdomen demonstrates diffuse decreased attenuation of the hepatic parenchyma compatible with hepatic steatosis. Layering radiopaque gallstones are seen within an otherwise normal-appearing gallbladder (image 103, series 4). Small hiatal hernia. Musculoskeletal: Mild symmetric bilateral gynecomastia. IMPRESSION: 1. Stable uncomplicated mild fusiform aneurysmal dilatation of the ascending thoracic aorta measuring 43 mm in diameter, grossly unchanged compared to the 11/2013 examination. 2. Coronary artery calcifications. Aortic Atherosclerosis (ICD10-I70.0). 3. Stable prominence of mediastinal and right hilar lymph nodes, similar to the 2020 examination and thus favored to be reactive in etiology. 4. Punctate (sub 4 mm) right upper and lower lobe pulmonary nodules are unchanged compared to the 11/2019 examination though not confidently seen on more remote examinations. No follow-up needed if patient is low-risk (and has no known or  suspected primary neoplasm). Non-contrast chest CT can be considered in 12 months if patient is high-risk. This recommendation follows the consensus statement: Guidelines for Management of Incidental Pulmonary Nodules Detected on CT Images: From the Fleischner Society 2017; Radiology 2017; 284:228-243. 5. Similar findings of cholelithiasis without evidence of cholecystitis and suspected hepatic steatosis. 6. Small hiatal hernia. Electronically Signed   By: Sandi Mariscal M.D.   On: 06/25/2020 13:55     Assessment & Plan:   Dyspnea on exertion - Overall this symptoms are mild and occur only with moderate exertion such as climbing stairs or hills. His cardiac and pulmonary testing have been unrevealing. HRCT showed no evidence of ILD, scarring right lung base from previous infection. Eosinophils and IGE were slightly elevated; PFTs in 2018 were normal with some evidence of air trapping. Recommend trial Albuterol hfa 2 puffs every 6 hours as needed for shortness of breath/wheezing. Encourage patient work on weight loss and increase physical activity level. Return as needed if symptoms worsen.   Hypothyroidism following radioiodine therapy - Managed by PCP, TSH remains elevated - Continue Synthroid 132mcg daily    Martyn Ehrich, NP 07/23/2020

## 2020-07-23 NOTE — Assessment & Plan Note (Signed)
-   Managed by PCP, TSH remains elevated - Continue Synthroid 134mcg daily

## 2020-07-28 DIAGNOSIS — E890 Postprocedural hypothyroidism: Secondary | ICD-10-CM | POA: Diagnosis not present

## 2020-07-28 DIAGNOSIS — E669 Obesity, unspecified: Secondary | ICD-10-CM | POA: Diagnosis not present

## 2020-07-28 DIAGNOSIS — R7309 Other abnormal glucose: Secondary | ICD-10-CM | POA: Diagnosis not present

## 2020-07-28 DIAGNOSIS — I10 Essential (primary) hypertension: Secondary | ICD-10-CM | POA: Diagnosis not present

## 2020-07-28 DIAGNOSIS — I2510 Atherosclerotic heart disease of native coronary artery without angina pectoris: Secondary | ICD-10-CM | POA: Diagnosis not present

## 2020-07-31 ENCOUNTER — Ambulatory Visit (INDEPENDENT_AMBULATORY_CARE_PROVIDER_SITE_OTHER): Payer: Medicare PPO | Admitting: Pharmacist

## 2020-07-31 ENCOUNTER — Other Ambulatory Visit: Payer: Self-pay

## 2020-07-31 VITALS — BP 144/104 | HR 64

## 2020-07-31 DIAGNOSIS — I10 Essential (primary) hypertension: Secondary | ICD-10-CM | POA: Diagnosis not present

## 2020-07-31 MED ORDER — LISINOPRIL 40 MG PO TABS: 40.0000 mg | ORAL_TABLET | Freq: Every day | ORAL | 3 refills | Status: DC

## 2020-07-31 NOTE — Progress Notes (Signed)
Patient ID: Blake Carter                 DOB: 05-28-1951                      MRN: [id number]     HPI: Blake Carter is a 70 y.o. male referred by Dr. Radford Pax to HTN clinic. PMH is significant for hx of CAD (DES to LCx 05/2014 with residual LAD disease with repeat cath 02/2017 with patent stents and otherwise mild nonobstructive disease), HTN, dyslipidemia, sinus bradycardia, dilated aortic root (68mm by echo 10/2018), chronic diastolic CHF, allergic rhinits, GERD, hypothyroidism, prostate cancer, hydronephrosis of right kidney (s/p ureteral stenting), depression, and insomnia. Lipid meds have previously been adjusted (atorvastatin changed to rosuvastatin, gemfibrozil changed to fenofibrate due to prior myalgias, did not want injectable therapy or fish oil due to potential correlation with prostate cancer). Pt was previously followed in PharmD clinic for BP management earlier this year. At that time, his BP was well controlled on amlodipine 10mg  daily and lisinopril 10mg  daily. Home BP cuff calibrated accurately.   Echo 7/21 showed LVEF 60-65%, chest CT showed slight enlargement of ascending aortic aneurysm. He wore a Zio patch that detected afib and has since been started on Eliquis. Previously took bupropion which increased his BP. He experienced itching on Eliquis and was subsequently changed to Xarelto. His itching persisted and his metoprolol was stopped and changed to diltiazem. Itching ended up being due to amlodipine and pt wished to change back to Eliquis. Beta blockers have worked better to control his tachycardia/palpitations than diltiazem did. Pacemaker set to keep HR 60-120. At my most recent visits with pt, chlorthalidone was started and carvedilol was changed back to Toprol per pt preference as he felt Toprol controlled his palpitations better. BP was controlled at last visit with me in January, however his SCr increased after addition of chlorthalidone. Dose was decreased to 12.5mg  daily  and lisinopril was increased to 40mg  daily for BP control with stable f/u BMET.  He saw Dr Radford Pax 2/1 for follow up and reported DOE and chest tightness with minimal exertion. Previously walked 4 miles daily with no issues. He underwent cardiac cath on 07/08/20 which showed widely patent LCx and LAD stents, normal RCA and LM and diffuse nonobstructive disease of the PDA. There was a 30% stenosis prior to the mid LAD stent and 225-30% in the distal LCx, EF 55%, medical management recommended. He saw Dr Radford Pax on 2/22 for follow up and reported having stopped his chlorthalidone over the weekend because of feeling weak and low BP. BP was 100/64 at this visit and his lisinopril was also decreased to 20mg  daily. Pt presents today for follow up.  Reports tolerating medications well. Home BP readings have increased since his lisinopril dose decreased to 20mg . Home readings - 134/85, 130/80, 137/82, 146/90. Saw his endocrinologist on 2/28 and BP was 112/62, HR 67. They also started pt on Wegovy, he's given 1 dose of 0.5mg  yesterday and has been tolerating well aside from a little nausea. BP previously very low on chlorthalidone down to 95/60 accompanied by dizziness. Hasn't needed any prn Lasix. Exercise tolerance a bit better, moving a lot more walking his dog and hasn't noticed chest pain.  Clinic reading - 134/94 in right arm, 144/104 in left arm. Home readings have been checked in his right arm only.   Current HTN medications:  Lisinopril 20mg  daily (morning) Metoprolol succinate 100mg  daily Furosemide 20mg  prn -  rarely uses  Previously tried:  Amlodipine 10mg  - itching Diltiazem - felt more symptomatic, didn't control tachycardia as well Carvedilol - didn't control palpitations as well as Toprol Chlorthalidone 25mg  - increase in SCr  BP goal: <130/51mmHg  Family History: father (cancer (died from small lung cancer per pt report), heart disease ("multiple bypasses", "aorta replaced"), HLD); brother  (CAD); "everyone on my dad's side had some kind of coronary disease / MI / heart failure"; grandmother (elevated TG)  Social History: never used tobacco, occasional alcohol use (1x or 2x per month)  Diet:  Breakfast: eggs with cheese and ham on an english muffin Lunch: salad or sandwich (sometimes skips lunch) Dinner: protein with vegetables with a starch  Beverages: drinks mostly water or propel (2x per day in 1 32oz water) Does not use added salt on foods (Uses Mrs Deliah Boston instead if needed) Only goes out to eat 1-2 times per week Has cut back on Propel  Exercise: walks 30 minutes to 1 hr 5-6 times per week  Wt Readings from Last 3 Encounters:  07/23/20 262 lb (118.8 kg)  07/22/20 263 lb (119.3 kg)  07/08/20 255 lb (115.7 kg)   BP Readings from Last 3 Encounters:  07/23/20 112/72  07/22/20 100/64  07/08/20 (!) 137/56   Pulse Readings from Last 3 Encounters:  07/23/20 69  07/22/20 64  07/08/20 62    Renal function: CrCl cannot be calculated (Patient's most recent lab result is older than the maximum 21 days allowed.).  Past Medical History:  Diagnosis Date   Allergic rhinitis    Aortic atherosclerosis ([place])    Arthritis    "a little bit in some of my joints" (06/20/2014)   Ascending aorta dilatation (HCC)    38mm by chest CTA 05/2020   Chronic diastolic CHF (congestive heart failure) (HCC)    Coronary artery disease    a. 05/2014 Cath/PCI: LM nl, LAD 40p, 60-70d (FFR 0.80->3.0x24 Synergy DES), D2 40, LCX 90d (2.25x12 Synergy DES), RCA 20-46m, EF 60-65%. b. Cath 02/2017 without acute change, elev LVEDP suggestive of dCHF.   Depression    Dilated aortic root (HCC)    aortic root at 67mm by Chest CTA 11/2019   ED (erectile dysfunction)    Environmental allergies    dry cough   GERD (gastroesophageal reflux disease)    History of hiatal hernia    Hydronephrosis of right kidney    a. s/p ureteral stenting in the past.   Hyperlipidemia    under control    Hypertension    Hypothyroidism    OCD (obsessive compulsive disorder)    Pacemaker    PAF (paroxysmal atrial fibrillation) (HCC)    Panic disorder    PAT (paroxysmal atrial tachycardia) (HCC)    PONV (postoperative nausea and vomiting)    after thyroid surgery no problems in 10 years    Prostate cancer ([place])    prostate cancer- robotic prostatectomy - followed by radiation because of positive lymph node--and pt on lupron injections   PVC's (premature ventricular contractions)    Sinus bradycardia    a. chronic, asymptomatic.  24 hour Holter 10/2016 showed Sinus bradycardia, sinus arrhythmia and normal sinus rhythm with average heart rate 50bpm and heart rate ranged from 32 to 100bpm.   SUI (stress urinary incontinence), male    S/P ROBOTIC PROSTATECTOMY AND RADIATION TX    Current Outpatient Medications on File Prior to Visit  Medication Sig Dispense Refill   acetaminophen (TYLENOL) 500 MG tablet Take 1,000  mg by mouth every 6 (six) hours as needed (pain).     albuterol (VENTOLIN HFA) 108 (90 Base) MCG/ACT inhaler Inhale 1-2 puffs into the lungs every 6 (six) hours as needed for wheezing or shortness of breath. 18 g 0   ALPRAZolam (XANAX) 0.25 MG tablet Take 1 tablet (0.25 mg total) by mouth 2 (two) times daily as needed for anxiety. 60 tablet 2   apixaban (ELIQUIS) 5 MG TABS tablet Take 1 tablet (5 mg total) by mouth 2 (two) times daily. 180 tablet 1   buPROPion (WELLBUTRIN XL) 150 MG 24 hr tablet Take 1 tablet (150 mg total) by mouth daily. 90 tablet 3   Calcium-Phosphorus-Vitamin D (CITRACAL +D3 PO) Take 2 tablets by mouth daily.     diazepam (VALIUM) 5 MG tablet Take 1 tablet (5 mg total) by mouth at bedtime. 30 tablet 2   fenofibrate (TRICOR) 145 MG tablet TAKE 1 TABLET BY MOUTH EVERY DAY (Patient taking differently: Take 145 mg by mouth daily.) 90 tablet 3   fexofenadine (ALLEGRA) 180 MG tablet Take 180 mg by mouth daily.     fluvoxaMINE (LUVOX) 100 MG tablet  Take 1 tablet (100 mg total) by mouth 2 (two) times daily. 180 tablet 3   furosemide (LASIX) 20 MG tablet TAKE 1 TABLET BY MOUTH  DAILY AS NEEDED FOR FLUID OR EDEMA (Patient taking differently: Take 20 mg by mouth daily as needed for edema. TAKE 1 TABLET BY MOUTH  DAILY AS NEEDED FOR FLUID OR EDEMA) 90 tablet 0   latanoprost (XALATAN) 0.005 % ophthalmic solution Place 1 drop into both eyes at bedtime.      leuprolide, 6 Month, (ELIGARD) 45 MG injection Inject 45 mg into the skin every 6 (six) months.     levothyroxine (SYNTHROID) 175 MCG tablet Take 175 mcg by mouth See admin instructions. PT TAKES 1 TABLET BY MOUTH DAILY EXCEPT SUNDAYS     lisinopril (ZESTRIL) 20 MG tablet Take 1 tablet (20 mg total) by mouth daily. 90 tablet 3   metoprolol succinate (TOPROL-XL) 100 MG 24 hr tablet Take 1 tablet (100 mg total) by mouth daily. Take with or immediately following a meal. 90 tablet 3   MYRBETRIQ 50 MG TB24 tablet Take 50 mg by mouth daily.     nitroGLYCERIN (NITROSTAT) 0.4 MG SL tablet PLACE 1 TABLET (0.4 MG TOTAL) UNDER THE TONGUE EVERY 5 (FIVE) MINUTES AS NEEDED FOR CHEST PAIN. 25 tablet 4   pantoprazole (PROTONIX) 40 MG tablet Take 2 tablets (80 mg total) by mouth daily. 180 tablet 3   rosuvastatin (CRESTOR) 40 MG tablet TAKE 1 TABLET BY MOUTH EVERY DAY (Patient taking differently: Take 40 mg by mouth daily.) 90 tablet 3   No current facility-administered medications on file prior to visit.    Allergies  Allergen Reactions   Tagamet [Cimetidine] Other (See Comments)    Unknown - over 30 years ago (2021) - unsure what occurred it may have been hives or flushing   Atorvastatin     Myalgias on 80mg  dose   Niacin And Related Other (See Comments)    Flushing      Assessment/Plan:  1. Hypertension - BP has increased above goal <130/61mmHg with chlorthalidone d/c and lisinopril dose decrease with both home and clinic BP readings. Will increase lisinopril back to 40mg  daily and  continue Toprol 100mg  daily for palpitations and Lasix 20mg  prn for swelling. Pt encouraged to check BP in both arms at home since the reading in his  right arm was about 25mmHg lower both systolic and diastolic today in clinic. Hopefully pt will start seeing weight loss with [facility] which will also help BP readings improve. F/u in 4 weeks for BP check and BMET.  Zakkary Thibault E. Zelig Gacek, PharmD, BCACP, [place] [address], [place], [place] [address] Phone: [phone]; Fax: [phone] 07/31/2020 7:16 AM

## 2020-07-31 NOTE — Patient Instructions (Addendum)
Increase your lisinopril back to 40mg  daily  Continue taking your other medications  Keep an eye on your blood pressure at home - your goal is < 130/33mmHg  Follow up in clinic in 1 month for a blood pressure check and lab work. Come fasting - we'll check your cholesterol and kidney function

## 2020-08-04 ENCOUNTER — Ambulatory Visit (HOSPITAL_COMMUNITY)
Admission: RE | Admit: 2020-08-04 | Discharge: 2020-08-04 | Disposition: A | Discharge status: Home / Self Care | Payer: Medicare PPO | Source: Ambulatory Visit | Attending: Cardiology | Admitting: Cardiology

## 2020-08-04 ENCOUNTER — Other Ambulatory Visit: Payer: Self-pay

## 2020-08-04 DIAGNOSIS — R29898 Other symptoms and signs involving the musculoskeletal system: Secondary | ICD-10-CM | POA: Insufficient documentation

## 2020-08-05 ENCOUNTER — Encounter: Payer: Self-pay | Admitting: Adult Health

## 2020-08-05 ENCOUNTER — Ambulatory Visit (INDEPENDENT_AMBULATORY_CARE_PROVIDER_SITE_OTHER): Payer: Medicare PPO | Admitting: Adult Health

## 2020-08-05 DIAGNOSIS — G4700 Insomnia, unspecified: Secondary | ICD-10-CM

## 2020-08-05 DIAGNOSIS — F331 Major depressive disorder, recurrent, moderate: Secondary | ICD-10-CM | POA: Diagnosis not present

## 2020-08-05 DIAGNOSIS — F429 Obsessive-compulsive disorder, unspecified: Secondary | ICD-10-CM | POA: Diagnosis not present

## 2020-08-05 DIAGNOSIS — F411 Generalized anxiety disorder: Secondary | ICD-10-CM

## 2020-08-05 MED ORDER — REXULTI 0.5 MG PO TABS: 0.5000 mg | ORAL_TABLET | Freq: Every day | ORAL | 0 refills | Status: DC

## 2020-08-05 NOTE — Progress Notes (Signed)
Samule Life [id number] 27-Jan-1951 70 y.o.  Subjective:   Patient ID:  Blake Carter is a 70 y.o. (DOB 07/10/1950) male.  Chief Complaint: No chief complaint on file.   HPI Blake Carter presents to the office today for follow-up of MDD, GAD, OCD.  Describes mood today as "ok". Pleasant. Increased tearfulness - "balling". Mood symptoms - reports depression - "worsening", anxiety, and irritability. Increased worry and rumination. Restarted Wellbutrin and does not feel like it has helped with mood. Decreased interest and motivation. Taking medications as prescribed. Energy levels lower. Active, does not have a regular exercise routine.  Enjoys some usual interests and activities. Married. Lives with wife of  76 years. Has 2 children - 1 son in [place] and 1 daughter in [place]. Spending time with family. Appetite adequate. Weight loss - 8 pounds - 250 pounds. Sleeping difficulties. Taking Ambien. Averages 6 to 8 hours. Napping during the day some days. Focus and concentration "can drift". Completing tasks. Managing aspects of household. Retired Forensic psychologist and professor.  Denies SI or HI.  Denies AH or VH.    PHQ2-9   Flowsheet Row CARDIAC REHAB PHASE II EXERCISE from 10/02/2014 in [facility] from 07/08/2014 in [facility]  PHQ-2 Total Score 0 0       Review of Systems:  Review of Systems  Musculoskeletal: Negative for gait problem.  Neurological: Negative for tremors.  Psychiatric/Behavioral:       Please refer to HPI    Medications: I have reviewed the patient's current medications.  Current Outpatient Medications  Medication Sig Dispense Refill   Brexpiprazole (REXULTI) 0.5 MG TABS Take 1 tablet (0.5 mg total) by mouth daily. 30 tablet 0   acetaminophen (TYLENOL) 500 MG tablet Take 1,000 mg by mouth every 6 (six) hours as needed (pain).     albuterol (VENTOLIN HFA) 108  (90 Base) MCG/ACT inhaler Inhale 1-2 puffs into the lungs every 6 (six) hours as needed for wheezing or shortness of breath. 18 g 0   ALPRAZolam (XANAX) 0.25 MG tablet Take 1 tablet (0.25 mg total) by mouth 2 (two) times daily as needed for anxiety. 60 tablet 2   apixaban (ELIQUIS) 5 MG TABS tablet Take 1 tablet (5 mg total) by mouth 2 (two) times daily. 180 tablet 1   buPROPion (WELLBUTRIN XL) 150 MG 24 hr tablet Take 1 tablet (150 mg total) by mouth daily. 90 tablet 3   Calcium-Phosphorus-Vitamin D (CITRACAL +D3 PO) Take 2 tablets by mouth daily.     diazepam (VALIUM) 5 MG tablet Take 1 tablet (5 mg total) by mouth at bedtime. 30 tablet 2   fenofibrate (TRICOR) 145 MG tablet TAKE 1 TABLET BY MOUTH EVERY DAY (Patient taking differently: Take 145 mg by mouth daily.) 90 tablet 3   fexofenadine (ALLEGRA) 180 MG tablet Take 180 mg by mouth daily.     fluvoxaMINE (LUVOX) 100 MG tablet Take 1 tablet (100 mg total) by mouth 2 (two) times daily. 180 tablet 3   furosemide (LASIX) 20 MG tablet TAKE 1 TABLET BY MOUTH  DAILY AS NEEDED FOR FLUID OR EDEMA (Patient taking differently: Take 20 mg by mouth daily as needed for edema. TAKE 1 TABLET BY MOUTH  DAILY AS NEEDED FOR FLUID OR EDEMA) 90 tablet 0   latanoprost (XALATAN) 0.005 % ophthalmic solution Place 1 drop into both eyes at bedtime.      leuprolide, 6 Month, (ELIGARD) 45 MG injection  Inject 45 mg into the skin every 6 (six) months.     levothyroxine (SYNTHROID) 175 MCG tablet Take 175 mcg by mouth See admin instructions. 158mcg on Saturdays and Sundays     levothyroxine (SYNTHROID) 200 MCG tablet Take 200 mcg by mouth. 251mcg Monday through Friday     lisinopril (ZESTRIL) 40 MG tablet Take 1 tablet (40 mg total) by mouth daily. 90 tablet 3   metoprolol succinate (TOPROL-XL) 100 MG 24 hr tablet Take 1 tablet (100 mg total) by mouth daily. Take with or immediately following a meal. 90 tablet 3   MYRBETRIQ 50 MG TB24 tablet Take 50 mg by  mouth daily.     nitroGLYCERIN (NITROSTAT) 0.4 MG SL tablet PLACE 1 TABLET (0.4 MG TOTAL) UNDER THE TONGUE EVERY 5 (FIVE) MINUTES AS NEEDED FOR CHEST PAIN. 25 tablet 4   pantoprazole (PROTONIX) 40 MG tablet Take 2 tablets (80 mg total) by mouth daily. 180 tablet 3   rosuvastatin (CRESTOR) 40 MG tablet TAKE 1 TABLET BY MOUTH EVERY DAY (Patient taking differently: Take 40 mg by mouth daily.) 90 tablet 3   Semaglutide-Weight Management (WEGOVY) 0.25 MG/0.5ML SOAJ Inject 0.5 mg into the skin once a week.     No current facility-administered medications for this visit.    Medication Side Effects: None  Allergies:  Allergies  Allergen Reactions   Tagamet [Cimetidine] Other (See Comments)    Unknown - over 30 years ago (2021) - unsure what occurred it may have been hives or flushing   Atorvastatin     Myalgias on 80mg  dose   Niacin And Related Other (See Comments)    Flushing     Past Medical History:  Diagnosis Date   Allergic rhinitis    Aortic atherosclerosis ([place])    Arthritis    "a little bit in some of my joints" (06/20/2014)   Ascending aorta dilatation (HCC)    25mm by chest CTA 05/2020   Chronic diastolic CHF (congestive heart failure) ([place])    Coronary artery disease    a. 05/2014 Cath/PCI: LM nl, LAD 40p, 60-70d (FFR 0.80->3.0x24 Synergy DES), D2 40, LCX 90d (2.25x12 Synergy DES), RCA 20-54m, EF 60-65%. b. Cath 02/2017 without acute change, elev LVEDP suggestive of dCHF.   Depression    Dilated aortic root (HCC)    aortic root at 64mm by Chest CTA 11/2019   ED (erectile dysfunction)    Environmental allergies    dry cough   GERD (gastroesophageal reflux disease)    History of hiatal hernia    Hydronephrosis of right kidney    a. s/p ureteral stenting in the past.   Hyperlipidemia    under control   Hypertension    Hypothyroidism    OCD (obsessive compulsive disorder)    Pacemaker    PAF (paroxysmal atrial fibrillation) (HCC)    Panic  disorder    PAT (paroxysmal atrial tachycardia) (HCC)    PONV (postoperative nausea and vomiting)    after thyroid surgery no problems in 10 years    Prostate cancer ([place])    prostate cancer- robotic prostatectomy - followed by radiation because of positive lymph node--and pt on lupron injections   PVC's (premature ventricular contractions)    Sinus bradycardia    a. chronic, asymptomatic.  24 hour Holter 10/2016 showed Sinus bradycardia, sinus arrhythmia and normal sinus rhythm with average heart rate 50bpm and heart rate ranged from 32 to 100bpm.   SUI (stress urinary incontinence), male    S/P ROBOTIC  PROSTATECTOMY AND RADIATION TX    Family History  Problem Relation Age of Onset   Cancer Father    Hyperlipidemia Father    Heart disease Father        Father had CABG/aorta repair by the time he was in his 90s   CAD Brother        Brother who is 74 years younger has had some sort of stenting    Social History   Socioeconomic History   Marital status: Married    Spouse name: Not on file   Number of children: Not on file   Years of education: Not on file   Highest education level: Not on file  Occupational History   Not on file  Tobacco Use   Smoking status: Never Smoker   Smokeless tobacco: Never Used  Vaping Use   Vaping Use: Never used  Substance and Sexual Activity   Alcohol use: Yes    Alcohol/week: 0.0 standard drinks    Comment: 06/20/2014 "1-2 drinks a couple times/month"   Drug use: No   Sexual activity: Yes  Other Topics Concern   Not on file  Social History Narrative   Not on file   Social Determinants of Health   Financial Resource Strain: Not on file  Food Insecurity: Not on file  Transportation Needs: Not on file  Physical Activity: Not on file  Stress: Not on file  Social Connections: Not on file  Intimate Partner Violence: Not on file    Past Medical History, Surgical history, Social history, and Family history were  reviewed and updated as appropriate.   Please see review of systems for further details on the patient's review from today.   Objective:   Physical Exam:  There were no vitals taken for this visit.  Physical Exam Constitutional:      General: He is not in acute distress. Musculoskeletal:        General: No deformity.  Neurological:     Mental Status: He is alert and oriented to person, place, and time.     Coordination: Coordination normal.  Psychiatric:        Attention and Perception: Attention and perception normal. He does not perceive auditory or visual hallucinations.        Mood and Affect: Mood normal. Mood is not anxious or depressed. Affect is not labile, blunt, angry or inappropriate.        Speech: Speech normal.        Behavior: Behavior normal.        Thought Content: Thought content normal. Thought content is not paranoid or delusional. Thought content does not include homicidal or suicidal ideation. Thought content does not include homicidal or suicidal plan.        Cognition and Memory: Cognition and memory normal.        Judgment: Judgment normal.     Comments: Insight intact     Lab Review:     Component Value Date/Time   NA 136 07/08/2020 0826   NA 140 07/03/2020 1146   K 4.1 07/08/2020 0826   CL 102 07/03/2020 1146   CO2 27 07/03/2020 1146   GLUCOSE 101 (H) 07/03/2020 1146   GLUCOSE 98 10/31/2018 1247   BUN 36 (H) 07/03/2020 1146   CREATININE 1.23 07/03/2020 1146   CREATININE 0.92 10/31/2018 1247   CREATININE 1.07 10/14/2015 0850   CALCIUM 10.1 07/03/2020 1146   PROT 7.0 03/28/2020 0908   ALBUMIN 4.7 03/28/2020 0908   AST 21  03/28/2020 0908   AST 19 10/31/2018 1247   ALT 27 03/28/2020 0908   ALT 25 10/31/2018 1247   ALKPHOS 71 03/28/2020 0908   BILITOT 0.2 03/28/2020 0908   BILITOT 0.3 10/31/2018 1247   GFRNONAA 60 07/03/2020 1146   GFRNONAA >60 10/31/2018 1247   GFRAA 69 07/03/2020 1146   GFRAA >60 10/31/2018 1247       Component Value  Date/Time   WBC 5.5 07/03/2020 1146   WBC 6.1 12/26/2019 0931   RBC 4.07 (L) 07/03/2020 1146   RBC 4.11 (L) 12/26/2019 0931   HGB 10.5 (L) 07/08/2020 0826   HGB 11.4 (L) 07/03/2020 1146   HCT 31.0 (L) 07/08/2020 0826   HCT 35.1 (L) 07/03/2020 1146   PLT 280 07/03/2020 1146   MCV 86 07/03/2020 1146   MCH 28.0 07/03/2020 1146   MCH 29.1 10/31/2018 1247   MCHC 32.5 07/03/2020 1146   MCHC 33.3 12/26/2019 0931   RDW 14.5 07/03/2020 1146   LYMPHSABS 1.4 12/26/2019 0931   LYMPHSABS 1.7 05/07/2019 1319   MONOABS 0.6 12/26/2019 0931   EOSABS 0.3 12/26/2019 0931   EOSABS 0.2 05/07/2019 1319   BASOSABS 0.0 12/26/2019 0931   BASOSABS 0.0 05/07/2019 1319    No results found for: POCLITH, LITHIUM   No results found for: PHENYTOIN, PHENOBARB, VALPROATE, CBMZ   .res Assessment: Plan:     Plan:  PDMP reviewed  1. Luvox 200mg  daily 2. Valium 5mg  at hs - for sleepless nights - not taking currently 3. Xanax 0.5mg  daily - not taking currently 4. D/C Wellbutrin XL 150mg  every morning.  Taking Ambien 5mg  daily - from previous prescription   Consider therapy  RTC 6 months  Patient advised to contact office with any questions, adverse effects, or acute worsening in signs and symptoms.  Discussed potential benefits, risk, and side effects of benzodiazepines to include potential risk of tolerance and dependence, as well as possible drowsiness.  Advised patient not to drive if experiencing drowsiness and to take lowest possible effective dose to minimize risk of dependence and tolerance.    Diagnoses and all orders for this visit:  Major depressive disorder, recurrent episode, moderate (HCC) -     Brexpiprazole (REXULTI) 0.5 MG TABS; Take 1 tablet (0.5 mg total) by mouth daily.  Generalized anxiety disorder  Insomnia, unspecified type  Obsessive-compulsive disorder, unspecified type     Please see After Visit Summary for patient specific instructions.  Future Appointments   Date Time Provider [place]  08/18/2020  7:05 AM [facility] DEVICE REMOTES [facility]  08/28/2020  8:30 AM [facility] PHARMACIST [facility]  08/28/2020  9:00 AM [facility] LAB [facility]  09/02/2020  9:00 AM Marcelene Weidemann, Berdie Ogren, NP [facility] None  11/17/2020  7:05 AM [facility] DEVICE REMOTES [facility]  01/07/2021  9:40 AM Damyiah Moxley, Berdie Ogren, NP [facility] None  02/16/2021  7:05 AM [facility] DEVICE REMOTES [facility]    No orders of the defined types were placed in this encounter.   -------------------------------

## 2020-08-18 ENCOUNTER — Ambulatory Visit (INDEPENDENT_AMBULATORY_CARE_PROVIDER_SITE_OTHER): Payer: Medicare PPO

## 2020-08-18 DIAGNOSIS — I442 Atrioventricular block, complete: Secondary | ICD-10-CM

## 2020-08-18 LAB — CUP PACEART REMOTE DEVICE CHECK
Date Time Interrogation Session: 20220321080529
Implantable Lead Implant Date: 20201221
Implantable Lead Implant Date: 20201221
Implantable Lead Location: 753859
Implantable Lead Location: 753860
Implantable Lead Model: 377169
Implantable Lead Model: 377171
Implantable Lead Serial Number: 7000011949
Implantable Lead Serial Number: 81083606
Implantable Pulse Generator Implant Date: 20201221
Pulse Gen Model: 407145
Pulse Gen Serial Number: 69703841

## 2020-08-25 NOTE — Progress Notes (Signed)
Remote pacemaker transmission.   

## 2020-08-28 ENCOUNTER — Other Ambulatory Visit: Payer: Medicare PPO | Admitting: *Deleted

## 2020-08-28 ENCOUNTER — Other Ambulatory Visit: Payer: Self-pay

## 2020-08-28 ENCOUNTER — Other Ambulatory Visit: Payer: Self-pay | Admitting: *Deleted

## 2020-08-28 ENCOUNTER — Ambulatory Visit (INDEPENDENT_AMBULATORY_CARE_PROVIDER_SITE_OTHER): Payer: Medicare PPO | Admitting: Pharmacist

## 2020-08-28 VITALS — BP 126/90 | HR 67

## 2020-08-28 DIAGNOSIS — I10 Essential (primary) hypertension: Secondary | ICD-10-CM

## 2020-08-28 DIAGNOSIS — E7800 Pure hypercholesterolemia, unspecified: Secondary | ICD-10-CM

## 2020-08-28 LAB — COMPREHENSIVE METABOLIC PANEL
ALT: 23 IU/L (ref 0–44)
AST: 22 IU/L (ref 0–40)
Albumin/Globulin Ratio: 2 (ref 1.2–2.2)
Albumin: 4.6 g/dL (ref 3.8–4.8)
Alkaline Phosphatase: 49 IU/L (ref 44–121)
BUN/Creatinine Ratio: 20 (ref 10–24)
BUN: 24 mg/dL (ref 8–27)
Bilirubin Total: 0.2 mg/dL (ref 0.0–1.2)
CO2: 19 mmol/L — ABNORMAL LOW (ref 20–29)
Calcium: 9.2 mg/dL (ref 8.6–10.2)
Chloride: 103 mmol/L (ref 96–106)
Creatinine, Ser: 1.21 mg/dL (ref 0.76–1.27)
Globulin, Total: 2.3 g/dL (ref 1.5–4.5)
Glucose: 91 mg/dL (ref 65–99)
Potassium: 4.5 mmol/L (ref 3.5–5.2)
Sodium: 143 mmol/L (ref 134–144)
Total Protein: 6.9 g/dL (ref 6.0–8.5)
eGFR: 65 mL/min/{1.73_m2} (ref >59)

## 2020-08-28 LAB — LIPID PANEL
Chol/HDL Ratio: 3 ratio (ref 0.0–5.0)
Cholesterol, Total: 116 mg/dL (ref 100–199)
HDL: 39 mg/dL — ABNORMAL LOW (ref >39)
LDL Chol Calc (NIH): 52 mg/dL (ref 0–99)
Triglycerides: 148 mg/dL (ref 0–149)
VLDL Cholesterol Cal: 25 mg/dL (ref 5–40)

## 2020-08-28 NOTE — Progress Notes (Addendum)
Patient ID: Blake Carter                 DOB: 1950/09/02                      MRN: [id number]     HPI: Blake Carter is a 70 y.o. male referred by Dr. Radford Pax to HTN clinic. PMH is significant for hx of CAD (DES to LCx 05/2014 with residual LAD disease with repeat cath 02/2017 with patent stents and otherwise mild nonobstructive disease), HTN, dyslipidemia, sinus bradycardia, dilated aortic root (76mm by echo 10/2018), chronic diastolic CHF, allergic rhinits, GERD, hypothyroidism, prostate cancer, hydronephrosis of right kidney (s/p ureteral stenting), depression, and insomnia. Lipid meds have previously been adjusted (atorvastatin changed to rosuvastatin, gemfibrozil changed to fenofibrate due to prior myalgias, did not want injectable therapy or fish oil due to potential correlation with prostate cancer). Pt was previously followed in PharmD clinic for BP management earlier this year. At that time, his BP was well controlled on amlodipine 10mg  daily and lisinopril 10mg  daily. Home BP cuff calibrated accurately.   Echo 7/21 showed LVEF 60-65%, chest CT showed slight enlargement of ascending aortic aneurysm. He wore a Zio patch that detected afib and has since been started on Eliquis. Previously took bupropion which increased his BP. He experienced itching on Eliquis and was subsequently changed to Xarelto. His itching persisted and his metoprolol was stopped and changed to diltiazem. Itching ended up being due to amlodipine and pt wished to change back to Eliquis. Beta blockers have worked better to control his tachycardia/palpitations than diltiazem did. Pacemaker set to keep HR 60-120. At my most recent visits with pt, chlorthalidone was started and carvedilol was changed back to Toprol per pt preference as he felt Toprol controlled his palpitations better. BP was controlled at last visit with me in January, however his SCr increased after addition of chlorthalidone. Dose was decreased to 12.5mg  daily  and lisinopril was increased to 40mg  daily for BP control with stable f/u BMET.  He saw Dr Radford Pax 2/1 for follow up and reported DOE and chest tightness with minimal exertion. Previously walked 4 miles daily with no issues. He underwent cardiac cath on 07/08/20 which showed widely patent LCx and LAD stents, normal RCA and LM and diffuse nonobstructive disease of the PDA. There was a 30% stenosis prior to the mid LAD stent and 225-30% in the distal LCx, EF 55%, medical management recommended. He saw Dr Radford Pax on 2/22 for follow up and reported having stopped his chlorthalidone over the weekend because of feeling weak and low BP. BP was 100/64 at this visit and his lisinopril was also decreased to 20mg  daily. He saw me for follow up on 07/31/20 and BP had increased back to 144/104, home readings also elevated. His lisinopril was increased back to 40mg .  Pt presents today in good spirits. Has not been checking his BP at home. Has felt lightheaded 2-3x since his last visit and thinks his BP was low at those times. Has seen a few other providers since his last visit with me and BP was all well controlled 115-120/75-80 at those visits. Has used prn Lasix once after eating pizza. Hasn't taken NTG in about 6 years. Stopped bupropion again, now on Rexulti. Hasn't had his Wegovy dose titrated - advised him to follow up with prescribing MD about this. Home weight improved to ~250 lbs.  BP checked today using automatic cuff - 126/90 R arm, recheck 138/87. Left  arm unable to auscultate manually, automatic cuff also errorred out 3x. Pt does not wish to retry chlorthalidone again due to prior hypotension. Would prefer to retry amlodipine if needed (he is not sure this was the cause of his itching).  Current HTN medications:  Lisinopril 40mg  daily (morning) Metoprolol succinate 100mg  daily Furosemide 20mg  prn - rarely uses  Previously tried:  Amlodipine 10mg  - itching Diltiazem - felt more symptomatic, didn't control  tachycardia as well Carvedilol - didn't control palpitations as well as Toprol Chlorthalidone 25mg  - increase in SCr  BP goal: <130/66mmHg  Family History: father (cancer (died from small lung cancer per pt report), heart disease ("multiple bypasses", "aorta replaced"), HLD); brother (CAD); "everyone on my dad's side had some kind of coronary disease / MI / heart failure"; grandmother (elevated TG)  Social History: never used tobacco, occasional alcohol use (1x or 2x per month)  Diet:  Breakfast: eggs with cheese and ham on an english muffin Lunch: salad or sandwich (sometimes skips lunch) Dinner: protein with vegetables with a starch  Beverages: drinks mostly water or propel (2x per day in 1 32oz water) Does not use added salt on foods (Uses Mrs Deliah Boston instead if needed) Only goes out to eat 1-2 times per week Has cut back on Propel  Exercise: walks 30 minutes to 1 hr 5-6 times per week  Wt Readings from Last 3 Encounters:  07/23/20 262 lb (118.8 kg)  07/22/20 263 lb (119.3 kg)  07/08/20 255 lb (115.7 kg)   BP Readings from Last 3 Encounters:  07/31/20 (!) 144/104  07/23/20 112/72  07/22/20 100/64   Pulse Readings from Last 3 Encounters:  07/31/20 64  07/23/20 69  07/22/20 64    Renal function: CrCl cannot be calculated (Patient's most recent lab result is older than the maximum 21 days allowed.).  Past Medical History:  Diagnosis Date  . Allergic rhinitis   . Aortic atherosclerosis ([place])   . Arthritis    "a little bit in some of my joints" (06/20/2014)  . Ascending aorta dilatation (HCC)    7mm by chest CTA 05/2020  . Chronic diastolic CHF (congestive heart failure) ([place])   . Coronary artery disease    a. 05/2014 Cath/PCI: LM nl, LAD 40p, 60-70d (FFR 0.80->3.0x24 Synergy DES), D2 40, LCX 90d (2.25x12 Synergy DES), RCA 20-86m, EF 60-65%. b. Cath 02/2017 without acute change, elev LVEDP suggestive of dCHF.  Marland Kitchen Depression   . Dilated aortic root (HCC)    aortic root at  71mm by Chest CTA 11/2019  . ED (erectile dysfunction)   . Environmental allergies    dry cough  . GERD (gastroesophageal reflux disease)   . History of hiatal hernia   . Hydronephrosis of right kidney    a. s/p ureteral stenting in the past.  . Hyperlipidemia    under control  . Hypertension   . Hypothyroidism   . OCD (obsessive compulsive disorder)   . Pacemaker   . PAF (paroxysmal atrial fibrillation) ([place])   . Panic disorder   . PAT (paroxysmal atrial tachycardia) ([place])   . PONV (postoperative nausea and vomiting)    after thyroid surgery no problems in 10 years   . Prostate cancer [facility])    prostate cancer- robotic prostatectomy - followed by radiation because of positive lymph node--and pt on lupron injections  . PVC's (premature ventricular contractions)   . Sinus bradycardia    a. chronic, asymptomatic.  24 hour Holter 10/2016 showed Sinus bradycardia, sinus arrhythmia  and normal sinus rhythm with average heart rate 50bpm and heart rate ranged from 32 to 100bpm.  . SUI (stress urinary incontinence), male    S/P ROBOTIC PROSTATECTOMY AND RADIATION TX    Current Outpatient Medications on File Prior to Visit  Medication Sig Dispense Refill  . acetaminophen (TYLENOL) 500 MG tablet Take 1,000 mg by mouth every 6 (six) hours as needed (pain).    Marland Kitchen albuterol (VENTOLIN HFA) 108 (90 Base) MCG/ACT inhaler Inhale 1-2 puffs into the lungs every 6 (six) hours as needed for wheezing or shortness of breath. 18 g 0  . ALPRAZolam (XANAX) 0.25 MG tablet Take 1 tablet (0.25 mg total) by mouth 2 (two) times daily as needed for anxiety. 60 tablet 2  . apixaban (ELIQUIS) 5 MG TABS tablet Take 1 tablet (5 mg total) by mouth 2 (two) times daily. 180 tablet 1  . Brexpiprazole (REXULTI) 0.5 MG TABS Take 1 tablet (0.5 mg total) by mouth daily. 30 tablet 0  . buPROPion (WELLBUTRIN XL) 150 MG 24 hr tablet Take 1 tablet (150 mg total) by mouth daily. 90 tablet 3  . Calcium-Phosphorus-Vitamin D (CITRACAL  +D3 PO) Take 2 tablets by mouth daily.    . diazepam (VALIUM) 5 MG tablet Take 1 tablet (5 mg total) by mouth at bedtime. 30 tablet 2  . fenofibrate (TRICOR) 145 MG tablet TAKE 1 TABLET BY MOUTH EVERY DAY (Patient taking differently: Take 145 mg by mouth daily.) 90 tablet 3  . fexofenadine (ALLEGRA) 180 MG tablet Take 180 mg by mouth daily.    . fluvoxaMINE (LUVOX) 100 MG tablet Take 1 tablet (100 mg total) by mouth 2 (two) times daily. 180 tablet 3  . furosemide (LASIX) 20 MG tablet TAKE 1 TABLET BY MOUTH  DAILY AS NEEDED FOR FLUID OR EDEMA (Patient taking differently: Take 20 mg by mouth daily as needed for edema. TAKE 1 TABLET BY MOUTH  DAILY AS NEEDED FOR FLUID OR EDEMA) 90 tablet 0  . latanoprost (XALATAN) 0.005 % ophthalmic solution Place 1 drop into both eyes at bedtime.     Marland Kitchen leuprolide, 6 Month, (ELIGARD) 45 MG injection Inject 45 mg into the skin every 6 (six) months.    . levothyroxine (SYNTHROID) 175 MCG tablet Take 175 mcg by mouth See admin instructions. 114mcg on Saturdays and Sundays    . levothyroxine (SYNTHROID) 200 MCG tablet Take 200 mcg by mouth. 218mcg Monday through Friday    . lisinopril (ZESTRIL) 40 MG tablet Take 1 tablet (40 mg total) by mouth daily. 90 tablet 3  . metoprolol succinate (TOPROL-XL) 100 MG 24 hr tablet Take 1 tablet (100 mg total) by mouth daily. Take with or immediately following a meal. 90 tablet 3  . MYRBETRIQ 50 MG TB24 tablet Take 50 mg by mouth daily.    . nitroGLYCERIN (NITROSTAT) 0.4 MG SL tablet PLACE 1 TABLET (0.4 MG TOTAL) UNDER THE TONGUE EVERY 5 (FIVE) MINUTES AS NEEDED FOR CHEST PAIN. 25 tablet 4  . pantoprazole (PROTONIX) 40 MG tablet Take 2 tablets (80 mg total) by mouth daily. 180 tablet 3  . rosuvastatin (CRESTOR) 40 MG tablet TAKE 1 TABLET BY MOUTH EVERY DAY (Patient taking differently: Take 40 mg by mouth daily.) 90 tablet 3  . Semaglutide-Weight Management (WEGOVY) 0.25 MG/0.5ML SOAJ Inject 0.5 mg into the skin once a week.     No  current facility-administered medications on file prior to visit.    Allergies  Allergen Reactions  . Tagamet [Cimetidine] Other (See  Comments)    Unknown - over 30 years ago (2021) - unsure what occurred it may have been hives or flushing  . Atorvastatin     Myalgias on 80mg  dose  . Niacin And Related Other (See Comments)    Flushing      Assessment/Plan:  1. Hypertension - BP has improved but remains above <130/42mmHg in clinic, however pt has not been checking BP at home and his BP at other MD visits recently have all been at goal. Will continue lisinopril 40mg  daily, Toprol 100mg  daily for palpitations and Lasix 20mg  prn for swelling. Pt encouraged to resume monitoring BP at home and will send me updated readings via MyChart in about a month. Advised pt he can try Voltaren gel on his ankle which would be preferable to oral NSAIDs. F/u in clinic as needed.  Blake Carter, PharmD, BCACP, [place] [address], [place], [place] [address] Phone: [phone]; Fax: [phone] 08/28/2020 7:16 AM

## 2020-08-28 NOTE — Patient Instructions (Addendum)
Your blood pressure goal is < 130/50mmHg  Continue taking your current medications  Keep an eye on your blood pressure and call clinic with any issues  Stay hydrated with water  Send me a MyChart message near the end of April with home blood pressure readings  You can try Voltaren gel on your ankle

## 2020-09-01 ENCOUNTER — Telehealth: Payer: Self-pay | Admitting: Pharmacist

## 2020-09-01 NOTE — Telephone Encounter (Signed)
Called pt to advise him of stable lab results. He already reviewed them on MyChart and was pleased with the results. No med changes needed, he plans to send follow up home BP readings in about a month for follow up.

## 2020-09-02 ENCOUNTER — Other Ambulatory Visit: Payer: Self-pay

## 2020-09-02 ENCOUNTER — Encounter: Payer: Self-pay | Admitting: Adult Health

## 2020-09-02 ENCOUNTER — Ambulatory Visit (INDEPENDENT_AMBULATORY_CARE_PROVIDER_SITE_OTHER): Payer: Medicare PPO | Admitting: Adult Health

## 2020-09-02 DIAGNOSIS — F411 Generalized anxiety disorder: Secondary | ICD-10-CM | POA: Diagnosis not present

## 2020-09-02 DIAGNOSIS — F331 Major depressive disorder, recurrent, moderate: Secondary | ICD-10-CM

## 2020-09-02 DIAGNOSIS — G4700 Insomnia, unspecified: Secondary | ICD-10-CM | POA: Diagnosis not present

## 2020-09-02 DIAGNOSIS — F429 Obsessive-compulsive disorder, unspecified: Secondary | ICD-10-CM | POA: Diagnosis not present

## 2020-09-02 MED ORDER — BREXPIPRAZOLE 1 MG PO TABS: 1.0000 mg | ORAL_TABLET | Freq: Every day | ORAL | 3 refills | Status: DC

## 2020-09-02 NOTE — Progress Notes (Signed)
Blake Carter [id number] 12/09/1950 70 y.o.  Subjective:   Patient ID:  Blake Carter is a 70 y.o. (DOB 03/21/1951) male.  Chief Complaint: No chief complaint on file.   HPI Blake Carter presents to the office today for follow-up of MDD, GAD, OCD, and insomnia.  Describes mood today as "ok". Pleasant. Denies tearfulness. Mood symptoms - reports decreased depression - "not really". Denies anxiety, and irritability. Decreased worry and rumination. Stopped Wellbutrin and started Rexulti. Feels like addition of Rexulti has made a "big difference". Improved interest and motivation. Taking medications as prescribed. Energy levels improved. Active, does not have a regular exercise routine. Walking dog more. Enjoys some usual interests and activities. Married. Lives with wife of  60 years. Has 2 children - 1 son in [place] and 1 daughter in [place]. Spending time with family. Appetite adequate. Weight loss - 8 pounds - 250 pounds. Sleep has improved. Averages 6 to 8 hours - no longer using Ambien. Napping during the day some days. Focus and concentration stable. Completing tasks. Managing aspects of household. Retired Forensic psychologist and professor.  Denies SI or HI.  Denies AH or VH.   PHQ2-9   Flowsheet Row CARDIAC REHAB PHASE II EXERCISE from 10/02/2014 in [place] from 07/08/2014 in [facility]  PHQ-2 Total Score 0 0       Review of Systems:  Review of Systems  Musculoskeletal: Negative for gait problem.  Neurological: Negative for tremors.  Psychiatric/Behavioral:       Please refer to HPI    Medications: I have reviewed the patient's current medications.  Current Outpatient Medications  Medication Sig Dispense Refill  . brexpiprazole (REXULTI) 1 MG TABS tablet Take 1 tablet (1 mg total) by mouth daily. 90 tablet 3  . acetaminophen (TYLENOL) 500 MG tablet Take 1,000 mg by mouth  every 6 (six) hours as needed (pain).    Marland Kitchen albuterol (VENTOLIN HFA) 108 (90 Base) MCG/ACT inhaler Inhale 1-2 puffs into the lungs every 6 (six) hours as needed for wheezing or shortness of breath. 18 g 0  . ALPRAZolam (XANAX) 0.25 MG tablet Take 1 tablet (0.25 mg total) by mouth 2 (two) times daily as needed for anxiety. 60 tablet 2  . apixaban (ELIQUIS) 5 MG TABS tablet Take 1 tablet (5 mg total) by mouth 2 (two) times daily. 180 tablet 1  . Calcium-Phosphorus-Vitamin D (CITRACAL +D3 PO) Take 2 tablets by mouth daily.    . diazepam (VALIUM) 5 MG tablet Take 1 tablet (5 mg total) by mouth at bedtime. (Patient taking differently: Take 5 mg by mouth at bedtime as needed.) 30 tablet 2  . fenofibrate (TRICOR) 145 MG tablet TAKE 1 TABLET BY MOUTH EVERY DAY (Patient taking differently: Take 145 mg by mouth daily.) 90 tablet 3  . ferrous sulfate 325 (65 FE) MG tablet Take 325 mg by mouth daily with breakfast.    . fexofenadine (ALLEGRA) 180 MG tablet Take 180 mg by mouth daily.    . fluvoxaMINE (LUVOX) 100 MG tablet Take 1 tablet (100 mg total) by mouth 2 (two) times daily. (Patient taking differently: Take 200 mg by mouth daily.) 180 tablet 3  . furosemide (LASIX) 20 MG tablet TAKE 1 TABLET BY MOUTH  DAILY AS NEEDED FOR FLUID OR EDEMA (Patient taking differently: Take 20 mg by mouth daily as needed for edema. TAKE 1 TABLET BY MOUTH  DAILY AS NEEDED FOR FLUID OR EDEMA) 90 tablet  0  . latanoprost (XALATAN) 0.005 % ophthalmic solution Place 1 drop into both eyes at bedtime.     Marland Kitchen leuprolide, 6 Month, (ELIGARD) 45 MG injection Inject 45 mg into the skin every 6 (six) months.    . levothyroxine (SYNTHROID) 175 MCG tablet Take 175 mcg by mouth See admin instructions. 181mcg on Saturdays and Sundays    . levothyroxine (SYNTHROID) 200 MCG tablet Take 200 mcg by mouth. 255mcg Monday through Friday    . lisinopril (ZESTRIL) 40 MG tablet Take 1 tablet (40 mg total) by mouth daily. 90 tablet 3  . metoprolol succinate  (TOPROL-XL) 100 MG 24 hr tablet Take 1 tablet (100 mg total) by mouth daily. Take with or immediately following a meal. 90 tablet 3  . MYRBETRIQ 50 MG TB24 tablet Take 50 mg by mouth daily.    . nitroGLYCERIN (NITROSTAT) 0.4 MG SL tablet PLACE 1 TABLET (0.4 MG TOTAL) UNDER THE TONGUE EVERY 5 (FIVE) MINUTES AS NEEDED FOR CHEST PAIN. 25 tablet 4  . pantoprazole (PROTONIX) 40 MG tablet Take 2 tablets (80 mg total) by mouth daily. 180 tablet 3  . rosuvastatin (CRESTOR) 40 MG tablet TAKE 1 TABLET BY MOUTH EVERY DAY (Patient taking differently: Take 40 mg by mouth daily.) 90 tablet 3  . Semaglutide-Weight Management (WEGOVY) 0.25 MG/0.5ML SOAJ Inject 0.5 mg into the skin once a week.     No current facility-administered medications for this visit.    Medication Side Effects: None  Allergies:  Allergies  Allergen Reactions  . Cimetidine Other (See Comments)    Unknown - over 30 years ago (2021) - unsure what occurred it may have been hives or flushing Other reaction(s): flushed  . Atorvastatin     Myalgias on 80mg  dose  . Niacin     Other reaction(s): intolerant  . Niacin And Related Other (See Comments)    Flushing     Past Medical History:  Diagnosis Date  . Allergic rhinitis   . Aortic atherosclerosis ([place])   . Arthritis    "a little bit in some of my joints" (06/20/2014)  . Ascending aorta dilatation (HCC)    77mm by chest CTA 05/2020  . Chronic diastolic CHF (congestive heart failure) ([place])   . Coronary artery disease    a. 05/2014 Cath/PCI: LM nl, LAD 40p, 60-70d (FFR 0.80->3.0x24 Synergy DES), D2 40, LCX 90d (2.25x12 Synergy DES), RCA 20-27m, EF 60-65%. b. Cath 02/2017 without acute change, elev LVEDP suggestive of dCHF.  Marland Kitchen Depression   . Dilated aortic root (HCC)    aortic root at 82mm by Chest CTA 11/2019  . ED (erectile dysfunction)   . Environmental allergies    dry cough  . GERD (gastroesophageal reflux disease)   . History of hiatal hernia   . Hydronephrosis of right  kidney    a. s/p ureteral stenting in the past.  . Hyperlipidemia    under control  . Hypertension   . Hypothyroidism   . OCD (obsessive compulsive disorder)   . Pacemaker   . PAF (paroxysmal atrial fibrillation) ([place])   . Panic disorder   . PAT (paroxysmal atrial tachycardia) ([place])   . PONV (postoperative nausea and vomiting)    after thyroid surgery no problems in 10 years   . Prostate cancer [facility])    prostate cancer- robotic prostatectomy - followed by radiation because of positive lymph node--and pt on lupron injections  . PVC's (premature ventricular contractions)   . Sinus bradycardia    a.  chronic, asymptomatic.  24 hour Holter 10/2016 showed Sinus bradycardia, sinus arrhythmia and normal sinus rhythm with average heart rate 50bpm and heart rate ranged from 32 to 100bpm.  . SUI (stress urinary incontinence), male    S/P ROBOTIC PROSTATECTOMY AND RADIATION TX    Family History  Problem Relation Age of Onset  . Cancer Father   . Hyperlipidemia Father   . Heart disease Father        Father had CABG/aorta repair by the time he was in his 54s  . CAD Brother        Brother who is 41 years younger has had some sort of stenting    Social History   Socioeconomic History  . Marital status: Married    Spouse name: Not on file  . Number of children: Not on file  . Years of education: Not on file  . Highest education level: Not on file  Occupational History  . Not on file  Tobacco Use  . Smoking status: Never Smoker  . Smokeless tobacco: Never Used  Vaping Use  . Vaping Use: Never used  Substance and Sexual Activity  . Alcohol use: Yes    Alcohol/week: 0.0 standard drinks    Comment: 06/20/2014 "1-2 drinks a couple times/month"  . Drug use: No  . Sexual activity: Yes  Other Topics Concern  . Not on file  Social History Narrative  . Not on file   Social Determinants of Health   Financial Resource Strain: Not on file  Food Insecurity: Not on file  Transportation  Needs: Not on file  Physical Activity: Not on file  Stress: Not on file  Social Connections: Not on file  Intimate Partner Violence: Not on file    Past Medical History, Surgical history, Social history, and Family history were reviewed and updated as appropriate.   Please see review of systems for further details on the patient's review from today.   Objective:   Physical Exam:  There were no vitals taken for this visit.  Physical Exam Constitutional:      General: He is not in acute distress. Musculoskeletal:        General: No deformity.  Neurological:     Mental Status: He is alert and oriented to person, place, and time.     Coordination: Coordination normal.  Psychiatric:        Attention and Perception: Attention and perception normal. He does not perceive auditory or visual hallucinations.        Mood and Affect: Mood normal. Mood is not anxious or depressed. Affect is not labile, blunt, angry or inappropriate.        Speech: Speech normal.        Behavior: Behavior normal.        Thought Content: Thought content normal. Thought content is not paranoid or delusional. Thought content does not include homicidal or suicidal ideation. Thought content does not include homicidal or suicidal plan.        Cognition and Memory: Cognition and memory normal.        Judgment: Judgment normal.     Comments: Insight intact     Lab Review:     Component Value Date/Time   NA 143 08/28/2020 0815   K 4.5 08/28/2020 0815   CL 103 08/28/2020 0815   CO2 19 (L) 08/28/2020 0815   GLUCOSE 91 08/28/2020 0815   GLUCOSE 98 10/31/2018 1247   BUN 24 08/28/2020 0815   CREATININE 1.21 08/28/2020 0815  CREATININE 0.92 10/31/2018 1247   CREATININE 1.07 10/14/2015 0850   CALCIUM 9.2 08/28/2020 0815   PROT 6.9 08/28/2020 0815   ALBUMIN 4.6 08/28/2020 0815   AST 22 08/28/2020 0815   AST 19 10/31/2018 1247   ALT 23 08/28/2020 0815   ALT 25 10/31/2018 1247   ALKPHOS 49 08/28/2020 0815    BILITOT 0.2 08/28/2020 0815   BILITOT 0.3 10/31/2018 1247   GFRNONAA 60 07/03/2020 1146   GFRNONAA >60 10/31/2018 1247   GFRAA 69 07/03/2020 1146   GFRAA >60 10/31/2018 1247       Component Value Date/Time   WBC 5.5 07/03/2020 1146   WBC 6.1 12/26/2019 0931   RBC 4.07 (L) 07/03/2020 1146   RBC 4.11 (L) 12/26/2019 0931   HGB 10.5 (L) 07/08/2020 0826   HGB 11.4 (L) 07/03/2020 1146   HCT 31.0 (L) 07/08/2020 0826   HCT 35.1 (L) 07/03/2020 1146   PLT 280 07/03/2020 1146   MCV 86 07/03/2020 1146   MCH 28.0 07/03/2020 1146   MCH 29.1 10/31/2018 1247   MCHC 32.5 07/03/2020 1146   MCHC 33.3 12/26/2019 0931   RDW 14.5 07/03/2020 1146   LYMPHSABS 1.4 12/26/2019 0931   LYMPHSABS 1.7 05/07/2019 1319   MONOABS 0.6 12/26/2019 0931   EOSABS 0.3 12/26/2019 0931   EOSABS 0.2 05/07/2019 1319   BASOSABS 0.0 12/26/2019 0931   BASOSABS 0.0 05/07/2019 1319    No results found for: POCLITH, LITHIUM   No results found for: PHENYTOIN, PHENOBARB, VALPROATE, CBMZ   .res Assessment: Plan:     Plan:  PDMP reviewed  1. Luvox 200mg  daily 2. Valium 5mg  at hs - for sleepless nights - not taking currently 3. Xanax 0.5mg  daily - not taking currently 4. D/C Wellbutrin XL 150mg  every morning. 5. Continue Rexulti 1mg  daily   Ambien 5mg  daily - no longer taking  Consider therapy  RTC 6 months  Patient advised to contact office with any questions, adverse effects, or acute worsening in signs and symptoms.  Discussed potential benefits, risk, and side effects of benzodiazepines to include potential risk of tolerance and dependence, as well as possible drowsiness.  Advised patient not to drive if experiencing drowsiness and to take lowest possible effective dose to minimize risk of dependence and tolerance.   Diagnoses and all orders for this visit:  Obsessive-compulsive disorder, unspecified type  Insomnia, unspecified type  Generalized anxiety disorder  Major depressive disorder,  recurrent episode, moderate (HCC) -     brexpiprazole (REXULTI) 1 MG TABS tablet; Take 1 tablet (1 mg total) by mouth daily.     Please see After Visit Summary for patient specific instructions.  Future Appointments  Date Time Provider [place]  11/17/2020  7:05 AM [facility] DEVICE REMOTES [facility]  01/07/2021  9:40 AM Jozy Mcphearson, Berdie Ogren, NP [facility] None  02/16/2021  7:05 AM [facility] DEVICE REMOTES [facility]  05/18/2021  7:05 AM [facility] DEVICE REMOTES [facility]  08/17/2021  7:05 AM [facility] DEVICE REMOTES [facility]  11/16/2021  7:05 AM [facility] DEVICE REMOTES [facility]    No orders of the defined types were placed in this encounter.   -------------------------------

## 2020-09-03 ENCOUNTER — Other Ambulatory Visit: Payer: Self-pay | Admitting: Urology

## 2020-09-03 ENCOUNTER — Other Ambulatory Visit (HOSPITAL_COMMUNITY): Payer: Self-pay | Admitting: Urology

## 2020-09-03 DIAGNOSIS — C61 Malignant neoplasm of prostate: Secondary | ICD-10-CM

## 2020-09-05 ENCOUNTER — Telehealth: Payer: Self-pay

## 2020-09-05 NOTE — Telephone Encounter (Signed)
Prior Approval received for REXULTI 1 MG with [employer]: ID# [id number], effective 05/31/2020-05/30/2021, PA# [id number]

## 2020-09-22 ENCOUNTER — Other Ambulatory Visit (HOSPITAL_COMMUNITY): Payer: Medicare PPO

## 2020-10-06 ENCOUNTER — Other Ambulatory Visit: Payer: Medicare PPO

## 2020-10-14 DIAGNOSIS — M76822 Posterior tibial tendinitis, left leg: Secondary | ICD-10-CM | POA: Diagnosis not present

## 2020-10-20 DIAGNOSIS — R0981 Nasal congestion: Secondary | ICD-10-CM | POA: Diagnosis not present

## 2020-10-20 DIAGNOSIS — K529 Noninfective gastroenteritis and colitis, unspecified: Secondary | ICD-10-CM | POA: Diagnosis not present

## 2020-10-20 DIAGNOSIS — R519 Headache, unspecified: Secondary | ICD-10-CM | POA: Diagnosis not present

## 2020-10-20 DIAGNOSIS — Z03818 Encounter for observation for suspected exposure to other biological agents ruled out: Secondary | ICD-10-CM | POA: Diagnosis not present

## 2020-10-20 DIAGNOSIS — R110 Nausea: Secondary | ICD-10-CM | POA: Diagnosis not present

## 2020-10-20 DIAGNOSIS — R197 Diarrhea, unspecified: Secondary | ICD-10-CM | POA: Diagnosis not present

## 2020-10-22 ENCOUNTER — Telehealth: Payer: Self-pay | Admitting: Pharmacist

## 2020-10-22 NOTE — Telephone Encounter (Signed)
Called pt to follow up with BP readings since his last visit about 2 months ago. Reports readings have been right around 130/82-84. Avoiding salt in his diet, hasn't been walking as much lately (about 2 hours total per week but would like to increase this). He was affected by the peanut butter salmonella recall and hasn't left his house in a week because of GI issues. He inquires about retrying low dose amlodipine. Will hold off for now since BP is right around his goal <130/63mmHg. He'll continue on lisinopril 40mg  daily and Toprol 100mg  daily and will work to increase his walking. Will call pt in another month to touch base with home BP readings. He was appreciative for the call.

## 2020-11-11 DIAGNOSIS — M8580 Other specified disorders of bone density and structure, unspecified site: Secondary | ICD-10-CM | POA: Diagnosis not present

## 2020-11-11 DIAGNOSIS — C61 Malignant neoplasm of prostate: Secondary | ICD-10-CM | POA: Diagnosis not present

## 2020-11-17 ENCOUNTER — Ambulatory Visit (INDEPENDENT_AMBULATORY_CARE_PROVIDER_SITE_OTHER): Payer: Medicare PPO

## 2020-11-17 DIAGNOSIS — I442 Atrioventricular block, complete: Secondary | ICD-10-CM

## 2020-11-17 LAB — CUP PACEART REMOTE DEVICE CHECK
Date Time Interrogation Session: 20220620092242
Implantable Lead Implant Date: 20201221
Implantable Lead Implant Date: 20201221
Implantable Lead Location: 753859
Implantable Lead Location: 753860
Implantable Lead Model: 377169
Implantable Lead Model: 377171
Implantable Lead Serial Number: 7000011949
Implantable Lead Serial Number: 81083606
Implantable Pulse Generator Implant Date: 20201221
Pulse Gen Model: 407145
Pulse Gen Serial Number: 69703841

## 2020-11-18 DIAGNOSIS — M25572 Pain in left ankle and joints of left foot: Secondary | ICD-10-CM | POA: Diagnosis not present

## 2020-11-18 DIAGNOSIS — M7662 Achilles tendinitis, left leg: Secondary | ICD-10-CM | POA: Diagnosis not present

## 2020-11-18 DIAGNOSIS — R7309 Other abnormal glucose: Secondary | ICD-10-CM | POA: Diagnosis not present

## 2020-11-18 DIAGNOSIS — I10 Essential (primary) hypertension: Secondary | ICD-10-CM | POA: Diagnosis not present

## 2020-11-18 DIAGNOSIS — E890 Postprocedural hypothyroidism: Secondary | ICD-10-CM | POA: Diagnosis not present

## 2020-11-19 ENCOUNTER — Other Ambulatory Visit (HOSPITAL_COMMUNITY): Payer: Self-pay | Admitting: Orthopaedic Surgery

## 2020-11-19 ENCOUNTER — Telehealth: Payer: Self-pay | Admitting: Pharmacist

## 2020-11-19 DIAGNOSIS — M25572 Pain in left ankle and joints of left foot: Secondary | ICD-10-CM

## 2020-11-19 NOTE — Telephone Encounter (Signed)
Called pt to follow up with home BP readings. Reports BP was 122/74 at the endocrinologist office yesterday, home readings 130/78-80. He's been walking more, reports tolerating medications well. He's aware to continue current meds including lisinopril 40mg  daily and Toprol 100mg  daily, and will call clinic with any BP-related issues.

## 2020-11-24 ENCOUNTER — Ambulatory Visit (HOSPITAL_COMMUNITY)
Admission: RE | Admit: 2020-11-24 | Discharge: 2020-11-24 | Disposition: A | Discharge status: Home / Self Care | Payer: Medicare PPO | Source: Ambulatory Visit | Attending: Orthopaedic Surgery | Admitting: Orthopaedic Surgery

## 2020-11-24 ENCOUNTER — Other Ambulatory Visit: Payer: Self-pay

## 2020-11-24 DIAGNOSIS — M65872 Other synovitis and tenosynovitis, left ankle and foot: Secondary | ICD-10-CM | POA: Diagnosis not present

## 2020-11-24 DIAGNOSIS — M76822 Posterior tibial tendinitis, left leg: Secondary | ICD-10-CM | POA: Diagnosis not present

## 2020-11-24 DIAGNOSIS — M25572 Pain in left ankle and joints of left foot: Secondary | ICD-10-CM | POA: Insufficient documentation

## 2020-11-24 DIAGNOSIS — R600 Localized edema: Secondary | ICD-10-CM | POA: Diagnosis not present

## 2020-11-24 NOTE — Progress Notes (Signed)
Informed of MRI for today.   Device system confirmed to be MRI conditional, with implant date > 6 weeks ago, and no evidence of abandoned or epicardial leads in review of most recent CXR Interrogation from today reviewed in-person by Device rep.   Device settings for MRI set to Auto-Detect for MRI without programming changes.    Shirley Friar, PA-C  11/24/2020 12:32 PM

## 2020-11-27 DIAGNOSIS — M79672 Pain in left foot: Secondary | ICD-10-CM | POA: Diagnosis not present

## 2020-12-05 NOTE — Progress Notes (Signed)
Remote pacemaker transmission.   

## 2020-12-07 DIAGNOSIS — Z20822 Contact with and (suspected) exposure to covid-19: Secondary | ICD-10-CM | POA: Diagnosis not present

## 2020-12-08 DIAGNOSIS — B349 Viral infection, unspecified: Secondary | ICD-10-CM | POA: Diagnosis not present

## 2020-12-08 DIAGNOSIS — U071 COVID-19: Secondary | ICD-10-CM | POA: Diagnosis not present

## 2020-12-08 DIAGNOSIS — Z20828 Contact with and (suspected) exposure to other viral communicable diseases: Secondary | ICD-10-CM | POA: Diagnosis not present

## 2020-12-15 ENCOUNTER — Telehealth: Payer: Self-pay

## 2020-12-15 MED ORDER — PANTOPRAZOLE SODIUM 40 MG PO TBEC: 80.0000 mg | DELAYED_RELEASE_TABLET | Freq: Every day | ORAL | 0 refills | Status: DC

## 2020-12-15 NOTE — Telephone Encounter (Signed)
Received voicemail from patient requesting a refill on Protonix. He states he would like to change pharmacies. He states he would like to use [facility] on [address].   Reviewed chart.   Rx(s) sent to pharmacy electronically.  Patient notified and voiced understanding.

## 2020-12-24 DIAGNOSIS — M76822 Posterior tibial tendinitis, left leg: Secondary | ICD-10-CM | POA: Diagnosis not present

## 2020-12-24 DIAGNOSIS — C61 Malignant neoplasm of prostate: Secondary | ICD-10-CM | POA: Diagnosis not present

## 2020-12-24 DIAGNOSIS — M2012 Hallux valgus (acquired), left foot: Secondary | ICD-10-CM | POA: Diagnosis not present

## 2021-01-02 ENCOUNTER — Other Ambulatory Visit: Payer: Self-pay

## 2021-01-02 MED ORDER — APIXABAN 5 MG PO TABS: 5.0000 mg | ORAL_TABLET | Freq: Two times a day (BID) | ORAL | 1 refills | Status: DC

## 2021-01-02 NOTE — Telephone Encounter (Signed)
Prescription refill request for Eliquis received. Indication: Afib Last office visit: 07/22/20 Radford Pax) Scr: 1.12 (08/28/20) Age: 70 Weight: 118.8kg  Appropriate dose and refill sent to requested pharmacy.

## 2021-01-06 ENCOUNTER — Other Ambulatory Visit: Payer: Self-pay

## 2021-01-06 MED ORDER — APIXABAN 5 MG PO TABS: 5.0000 mg | ORAL_TABLET | Freq: Two times a day (BID) | ORAL | 1 refills | Status: DC

## 2021-01-06 NOTE — Telephone Encounter (Signed)
Prescription refill request for Eliquis received. Indication: Afib Last office visit:07/22/20 ([place]) Scr: 1.23 (07/03/20) Age: 70 Weight: 118.8kg  Appropriate dose and refill sent to requested pharmacy.

## 2021-01-06 NOTE — Addendum Note (Signed)
Addended by: Leonidas Romberg on: 01/06/2021 01:58 PM   Modules accepted: Orders

## 2021-01-07 ENCOUNTER — Ambulatory Visit: Payer: Medicare PPO | Admitting: Adult Health

## 2021-01-14 DIAGNOSIS — M76822 Posterior tibial tendinitis, left leg: Secondary | ICD-10-CM | POA: Diagnosis not present

## 2021-01-26 DIAGNOSIS — S86112D Strain of other muscle(s) and tendon(s) of posterior muscle group at lower leg level, left leg, subsequent encounter: Secondary | ICD-10-CM | POA: Diagnosis not present

## 2021-01-28 DIAGNOSIS — S86112D Strain of other muscle(s) and tendon(s) of posterior muscle group at lower leg level, left leg, subsequent encounter: Secondary | ICD-10-CM | POA: Diagnosis not present

## 2021-02-04 DIAGNOSIS — S86112D Strain of other muscle(s) and tendon(s) of posterior muscle group at lower leg level, left leg, subsequent encounter: Secondary | ICD-10-CM | POA: Diagnosis not present

## 2021-02-05 DIAGNOSIS — S86112D Strain of other muscle(s) and tendon(s) of posterior muscle group at lower leg level, left leg, subsequent encounter: Secondary | ICD-10-CM | POA: Diagnosis not present

## 2021-02-06 ENCOUNTER — Other Ambulatory Visit: Payer: Self-pay

## 2021-02-06 ENCOUNTER — Encounter: Payer: Self-pay | Admitting: Cardiology

## 2021-02-06 ENCOUNTER — Ambulatory Visit: Payer: Medicare PPO | Admitting: Cardiology

## 2021-02-06 VITALS — BP 144/102 | HR 75 | Ht 71.0 in | Wt 256.2 lb

## 2021-02-06 DIAGNOSIS — I5032 Chronic diastolic (congestive) heart failure: Secondary | ICD-10-CM | POA: Diagnosis not present

## 2021-02-06 DIAGNOSIS — I480 Paroxysmal atrial fibrillation: Secondary | ICD-10-CM

## 2021-02-06 DIAGNOSIS — E7800 Pure hypercholesterolemia, unspecified: Secondary | ICD-10-CM

## 2021-02-06 DIAGNOSIS — I2583 Coronary atherosclerosis due to lipid rich plaque: Secondary | ICD-10-CM | POA: Diagnosis not present

## 2021-02-06 DIAGNOSIS — I2510 Atherosclerotic heart disease of native coronary artery without angina pectoris: Secondary | ICD-10-CM | POA: Diagnosis not present

## 2021-02-06 DIAGNOSIS — I495 Sick sinus syndrome: Secondary | ICD-10-CM

## 2021-02-06 DIAGNOSIS — I77819 Aortic ectasia, unspecified site: Secondary | ICD-10-CM | POA: Diagnosis not present

## 2021-02-06 DIAGNOSIS — I10 Essential (primary) hypertension: Secondary | ICD-10-CM

## 2021-02-06 MED ORDER — NITROGLYCERIN 0.4 MG SL SUBL: 0.4000 mg | SUBLINGUAL_TABLET | SUBLINGUAL | 4 refills | Status: DC | PRN

## 2021-02-06 MED ORDER — ROSUVASTATIN CALCIUM 40 MG PO TABS: 40.0000 mg | ORAL_TABLET | Freq: Every day | ORAL | 3 refills | Status: DC

## 2021-02-06 MED ORDER — APIXABAN 5 MG PO TABS: 5.0000 mg | ORAL_TABLET | Freq: Two times a day (BID) | ORAL | 3 refills | Status: DC

## 2021-02-06 MED ORDER — FENOFIBRATE 145 MG PO TABS: 145.0000 mg | ORAL_TABLET | Freq: Every day | ORAL | 3 refills | Status: DC

## 2021-02-06 MED ORDER — METOPROLOL SUCCINATE ER 100 MG PO TB24: 100.0000 mg | ORAL_TABLET | Freq: Every day | ORAL | 3 refills | Status: DC

## 2021-02-06 MED ORDER — LISINOPRIL 40 MG PO TABS: 40.0000 mg | ORAL_TABLET | Freq: Every day | ORAL | 3 refills | Status: DC

## 2021-02-06 MED ORDER — AMLODIPINE BESYLATE 5 MG PO TABS: 5.0000 mg | ORAL_TABLET | Freq: Every day | ORAL | 2 refills | Status: DC

## 2021-02-06 NOTE — Patient Instructions (Signed)
Medication Instructions:   START TAKING AMLODIPINE 5 MG BY MOUTH DAILY  *If you need a refill on your cardiac medications before your next appointment, please call your pharmacy*   Testing/Procedures:  MRA OF THE CHEST WITH/WITHOUT CONTRAST TO BE DONE IN JAN 2023 PER DR. Radford Pax   Follow-Up: At [facility], you and your health needs are our priority.  As part of our continuing mission to provide you with exceptional heart care, we have created designated Provider Care Teams.  These Care Teams include your primary Cardiologist (physician) and Advanced Practice Providers (APPs -  Physician Assistants and Nurse Practitioners) who all work together to provide you with the care you need, when you need it.  We recommend signing up for the patient portal called "MyChart".  Sign up information is provided on this After Visit Summary.  MyChart is used to connect with patients for Virtual Visits (Telemedicine).  Patients are able to view lab/test results, encounter notes, upcoming appointments, etc.  Non-urgent messages can be sent to your provider as well.   To learn more about what you can do with MyChart, go to [email or web address].    Your next appointment:   6 month(s)  The format for your next appointment:   In Person  Provider:   Fransico Him, MD   Other Instructions  PLEASE CHECK YOUR BP DAILY AND LOG THIS FOR THE NEXT WEEK--AFTER THAT PLEASE CALL OUR OFFICE OR MYCHART [place] TO REPORT TO DR. Murphys.

## 2021-02-06 NOTE — Progress Notes (Addendum)
Cardiology Office Note    Date:  02/06/2021   ID:  Byrant Chairs, DOB 06-19-1950, MRN [id number]  PCP:  [facility] [place]  Cardiologist:  Fransico Him, MD  Electrophysiologist:  Cristopher Peru, MD   Chief Complaint: f/u PAF, CAD< HTN, HLD  History of Present Illness:   Blake Carter is a 70 y.o. 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  Last cath was in 2018 showing patent stents, moderately elevated LVEDP. He underwent pacemaker implantation in 2020 for progressive bradycardia. Last echo 11/2019 EF 60-65%, grade 1 DD, mild AI (normal valve structure), moderate dilation of ascending aorta (69m) with CTA showing 4.4cm.  Was seen in the office 01/30/20 with episodic palpitations associated with SOB and chest discomfort. He wore a monitor showing NSR/ST average HR 67bpm, nonsustained atrial tachycardia, intermittent V pacing, occsaional PVCs, bigeminal PVCs, trigeminal PVCs and ventricular couplets and triplets, and wide complex tachycardia up to 5 beats. He was started on metoprolol. Nuclear stress test was normal. It does appear that on pacemaker interrogation on 01/26/20 the patient had actually had an episode of atrial fibrillation (prior to event monitor) therefore he was started on anticoagulation. He had itching with Eliquis so was changed to Xarelto.  He underwent cath 07/2020 showing widely patent LCx and LAD stents, normal RCA and LM and diffuse nonobstructive disease of the PDA.  There was a 30% stenosis prior to the mid LAD stent and 25-30% in distal LCx.  FFR was normal with no evidence of microvascular disease and LVEDP was normal.  Medical management was recommended.    He is here today for followup and is doing well.  He denies any anginal chest pain or pressure, SOB, DOE, PND, orthopnea, LE edema, dizziness, palpitations or syncope. Every evening he has a grabbing sensation around 10pm around his pacer site and only lasts a minute and then goes away and he thinks it is a pacer checking function.  He walk the dog daily  1.25 miles and does well except some fatigue with some hills.  He is compliant with his meds and is tolerating meds with no SE.     Past Medical History:  Diagnosis Date   Allergic rhinitis    Aortic atherosclerosis ([place]    Arthritis    "a little bit in some of my joints" (06/20/2014)   Ascending aorta dilatation (HCC)    482mby chest CTA 05/2020   Chronic diastolic CHF (congestive heart failure) (HCC)    Coronary artery disease    a. 05/2014 Cath/PCI: LM nl, LAD 40p, 60-70d (FFR 0.80->3.0x24 Synergy DES), D2 40, LCX 90d (2.25x12 Synergy DES), RCA 20-3083mF 60-65%. b. Cath 02/2017 without acute change, elev LVEDP suggestive of dCHF.   Depression    Dilated aortic root (HCC)    aortic root at 24m68m Chest CTA 11/2019   ED (erectile dysfunction)    Environmental allergies    dry cough   GERD (gastroesophageal reflux disease)    History of hiatal hernia    Hydronephrosis of right kidney    a. s/p ureteral stenting in the past.   Hyperlipidemia    under control   Hypertension    Hypothyroidism  OCD (obsessive compulsive disorder)    Pacemaker    PAF (paroxysmal atrial fibrillation) (HCC)    Panic disorder    PAT (paroxysmal atrial tachycardia) (HCC)    PONV (postoperative nausea and vomiting)    after thyroid surgery no problems in 10 years    Prostate cancer ([place])    prostate cancer- robotic prostatectomy - followed by radiation because of positive lymph node--and pt on lupron injections   PVC's (premature ventricular contractions)    Sinus bradycardia    a. chronic, asymptomatic.  24  hour Holter 10/2016 showed Sinus bradycardia, sinus arrhythmia and normal sinus rhythm with average heart rate 50bpm and heart rate ranged from 32 to 100bpm.   SUI (stress urinary incontinence), male    S/P ROBOTIC PROSTATECTOMY AND RADIATION TX    Past Surgical History:  Procedure Laterality Date   COLONOSCOPY  07/2013   Dr. Oletta Lamas   CORONARY ANGIOPLASTY WITH STENT PLACEMENT  06/20/2014   "2"   CYSTOSCOPY  11/30/2011   Procedure: CYSTOSCOPY;  Surgeon: Reece Packer, MD;  Location: [facility];  Service: Urology;  Laterality: N/A;  Inplantation of Artificial Sphincter and Cystoscopy   CYSTOSCOPY WITH RETROGRADE PYELOGRAM, URETEROSCOPY AND STENT PLACEMENT Right 02/11/2014   Procedure: CYSTOSCOPY WITH RETROGRADE PYELOGRAM, URETEROSCOPY ,URETERAL BIOPSY AND STENT PLACEMENT;  Surgeon: Raynelle Bring, MD;  Location: [facility];  Service: Urology;  Laterality: Right;   CYSTOSCOPY WITH RETROGRADE PYELOGRAM, URETEROSCOPY AND STENT PLACEMENT Right 04/15/2014   Procedure: CYSTOSCOPY WITH RETROGRADE PYELOGRAM, URETEROSCOPY, BIOPSY AND STENT PLACEMENT;  Surgeon: Raynelle Bring, MD;  Location: [facility];  Service: Urology;  Laterality: Right;   INTRAVASCULAR PRESSURE WIRE/FFR STUDY N/A 07/08/2020   Procedure: INTRAVASCULAR PRESSURE WIRE/FFR STUDY;  Surgeon: Belva Crome, MD;  Location: [place] [facility];  Service: Cardiovascular;  Laterality: N/A;   LEFT HEART CATH AND CORONARY ANGIOGRAPHY N/A 03/29/2017   Procedure: LEFT HEART CATH AND CORONARY ANGIOGRAPHY;  Surgeon: Leonie Man, MD;  Location: [place] [facility];  Service: Cardiovascular;  Laterality: N/A;   LEFT HEART CATHETERIZATION WITH CORONARY ANGIOGRAM N/A 06/20/2014   Procedure: LEFT HEART CATHETERIZATION WITH CORONARY ANGIOGRAM;  Surgeon: Leonie Man, MD;  Location: [facility];  Service: Cardiovascular;  Laterality: N/A;   PACEMAKER IMPLANT N/A 05/21/2019   Procedure: PACEMAKER IMPLANT;  Surgeon: Evans Lance, MD;  Location: [place] [facility];   Service: Cardiovascular;  Laterality: N/A;   PERCUTANEOUS CORONARY STENT INTERVENTION (PCI-S)  06/20/2014   Procedure: PERCUTANEOUS CORONARY STENT INTERVENTION (PCI-S);  Surgeon: Leonie Man, MD;  Location: [facility];  Service: Cardiovascular;;  LAD and Circumflex   REFRACTIVE SURGERY Bilateral 2004   RIGHT/LEFT HEART CATH AND CORONARY ANGIOGRAPHY N/A 07/08/2020   Procedure: RIGHT/LEFT HEART CATH AND CORONARY ANGIOGRAPHY;  Surgeon: Belva Crome, MD;  Location: [place] [facility];  Service: Cardiovascular;  Laterality: N/A;   ROBOT ASSISTED LAPAROSCOPIC RADICAL PROSTATECTOMY  05/2010   THYROIDECTOMY, PARTIAL  10/1974   TONSILLECTOMY  1956    TOTAL THYROIDECTOMY  10/2012   "nuked it"   UPPER GI ENDOSCOPY     food impaction 2009 done by Dr Oletta Lamas   URINARY SPHINCTER IMPLANT  11/30/2011   Procedure: ARTIFICIAL URINARY SPHINCTER;  Surgeon: Reece Packer, MD;  Location: [facility];  Service: Urology;  Laterality: N/A;    Current Medications: Current Meds  Medication Sig   acetaminophen (TYLENOL) 500 MG tablet Take 1,000 mg by mouth every 6 (six) hours as needed (pain).   Cholecalciferol (VITAMIN  D3) 2000 units TABS Take 6,000 Units by mouth 3 (three) times a week.    diazepam (VALIUM) 5 MG tablet Take 1 tablet (5 mg total) by mouth at bedtime.   fenofibrate (TRICOR) 145 MG tablet Take 1 tablet (145 mg total) by mouth daily.   fexofenadine (ALLEGRA) 180 MG tablet Take 180 mg by mouth daily.   fluvoxaMINE (LUVOX) 50 MG tablet Take 200 mg by mouth every morning.    furosemide (LASIX) 20 MG tablet TAKE 1 TABLET BY MOUTH  DAILY AS NEEDED FOR FLUID  OR EDEMA   latanoprost (XALATAN) 0.005 % ophthalmic solution Place 1 drop into both eyes at bedtime.    leuprolide, 6 Month, (ELIGARD) 45 MG injection Inject 45 mg into the skin every 6 (six) months.   levothyroxine (SYNTHROID) 175 MCG tablet PT TAKES 1 TABLET BY MOUTH DAILY EXCEPT SUNDAYS   lisinopril (ZESTRIL) 10 MG tablet Take 1 tablet by mouth  daily.   MYRBETRIQ 50 MG TB24 tablet Take 50 mg by mouth daily.   nitroGLYCERIN (NITROSTAT) 0.4 MG SL tablet PLACE 1 TABLET (0.4 MG TOTAL) UNDER THE TONGUE EVERY 5 (FIVE) MINUTES AS NEEDED FOR CHEST PAIN.   pantoprazole (PROTONIX) 40 MG tablet Take 2 tablets (80 mg total) by mouth daily.   rivaroxaban (XARELTO) 20 MG TABS tablet Take 1 tablet (20 mg total) by mouth daily with supper.   amLODipine (NORVASC) 10 MG tablet Take 1 tablet (10 mg total) by mouth every evening.   metoprolol succinate (TOPROL XL) 25 MG 24 hr tablet Take 1 tablet (25 mg total) by mouth daily.        Allergies:   Cimetidine, Atorvastatin, Niacin, and Niacin and related   Social History   Socioeconomic History   Marital status: Married    Spouse name: Not on file   Number of children: Not on file   Years of education: Not on file   Highest education level: Not on file  Occupational History   Not on file  Tobacco Use   Smoking status: Never   Smokeless tobacco: Never  Vaping Use   Vaping Use: Never used  Substance and Sexual Activity   Alcohol use: Yes    Alcohol/week: 0.0 standard drinks    Comment: 06/20/2014 "1-2 drinks a couple times/month"   Drug use: No   Sexual activity: Yes  Other Topics Concern   Not on file  Social History Narrative   Not on file   Social Determinants of Health   Financial Resource Strain: Not on file  Food Insecurity: Not on file  Transportation Needs: Not on file  Physical Activity: Not on file  Stress: Not on file  Social Connections: Not on file     Family History:  The patient's family history includes CAD in his brother; Cancer in his father; Heart disease in his father; Hyperlipidemia in his father.  ROS:   Please see the history of present illness. Denies any unusual bleeding on Eliquis. All other systems are reviewed and otherwise negative.    EKGs/Labs/Other Studies Reviewed:    Studies reviewed are outlined and summarized above. Reports included  below if pertinent.  Cardiac Cath 07/08/2020 Conclusion  Widely patent LAD and circumflex stents. Widely patent left main Widely patent circumflex. The left anterior descending contains 30% stenosis proximal to the mid stent.  Hemodynamic assessment was performed with the Pressure Wire X demonstrating an RFR of 0.9 and an FFR of 0.82.  A full hemodynamic assessment to exclude microvascular dysfunction  was performed.   Right coronary is widely patent with luminal irregularities.  There is diffuse disease in the PDA but without focal high-grade obstruction. Left ventricular size is normal.  EF is 55%.  EDP is normal. Right heart pressures are normal.   RECOMMENDATIONS:   No evidence of significant epicardial disease or microvascular dysfunction. Continue aggressive risk factor modification. Initiate an exercise program and weight loss.    2D echo 713/21 IMPRESSIONS     1. Left ventricular ejection fraction, by estimation, is 60 to 65%. The  left ventricle has normal function. The left ventricle has no regional  wall motion abnormalities. Left ventricular diastolic parameters are  consistent with Grade I diastolic  dysfunction (impaired relaxation).   2. Right ventricular systolic function is normal. The right ventricular  size is normal. There is normal pulmonary artery systolic pressure.   3. The mitral valve is normal in structure. No evidence of mitral valve  regurgitation. No evidence of mitral stenosis.   4. The aortic valve is normal in structure. Aortic valve regurgitation is  mild. No aortic stenosis is present.   5. Aortic dilatation noted. There is moderate dilatation of the ascending  aorta measuring 46 mm.   6. The inferior vena cava is normal in size with greater than 50%  respiratory variability, suggesting right atrial pressure of 3 mmHg.   Comparison(s): Prior ECHO 42 mm ascending aorta. Current 45 mm. Consider  CTA of aorta for comparison.     EKG:  EKG is  not ordered today.  Recent Labs: 07/03/2020: Platelets 280 07/08/2020: Hemoglobin 10.5 08/28/2020: ALT 23; BUN 24; Creatinine, Ser 1.21; Potassium 4.5; Sodium 143  Recent Lipid Panel    Component Value Date/Time   CHOL 116 08/28/2020 0000   TRIG 148 08/28/2020 0000   HDL 39 (L) 08/28/2020 0000   CHOLHDL 3.0 08/28/2020 0000   CHOLHDL 3.8 03/29/2017 0554   VLDL 34 03/29/2017 0554   LDLCALC 52 08/28/2020 0000    PHYSICAL EXAM:    VS:  BP (!) 144/102   Pulse 75   Ht '5\' 11"'$  (1.803 m)   Wt 256 lb 3.2 oz (116.2 kg)   SpO2 96%   BMI 35.73 kg/m   BMI: Body mass index is 35.73 kg/m.  GEN: Well nourished, well developed in no acute distress HEENT: Normal NECK: No JVD; No carotid bruits LYMPHATICS: No lymphadenopathy CARDIAC:RRR, no murmurs, rubs, gallops RESPIRATORY:  Clear to auscultation without rales, wheezing or rhonchi  ABDOMEN: Soft, non-tender, non-distended MUSCULOSKELETAL:  No edema; No deformity  SKIN: Warm and dry NEUROLOGIC:  Alert and oriented x 3 PSYCHIATRIC:  Normal affect    Wt Readings from Last 3 Encounters:  02/06/21 256 lb 3.2 oz (116.2 kg)  07/23/20 262 lb (118.8 kg)  07/22/20 263 lb (119.3 kg)     ASSESSMENT & PLAN:    Paroxysmal atrial fibrillation  -he continues to maintain NSR on exam with no palpitations -Continue prescription drug management with Apixaban '5mg'$  BID and Toprol XL '100mg'$  daily>refilled  -denies any bleeding problems on the DOAC -I have personally reviewed and interpreted outside labs performed by patient's PCP which showed SCr 1.24, K+ 4.5 and Hbg 11.1 in March 2022   ASCAD  -Cardiac cath for exertional angina 07/2020 showed widely patent LAD and LCX stents with 30% stenosis proximal to mid LAD stent and diffuse nonobstructive disease in the PDA with 25-30% distal LCx , no other CAD and no evidence of microvascular disease.  LVEDP was normal -he  has not had any further angina -continue BB and statin -no ASA due to DOAC  Essential HTN   -BP is elevated on exam today- -continue Toprol '100mg'$  daily to suppress PAF -Continue prescription drug management with Lisinopril '40mg'$  daily >refilled  -he had been on amlodipine but was stopped due to itching with no rash and is willing to retry it -start Amlodipine '5mg'$  daily -he will check his BP daily for a week and call with results -continue chlo Hyperlipidemia  -I have personally reviewed and interpreted outside labs performed by patient's PCP which showed LDL 52, HDL 39, ALT 23 in march 2022  -Continue prescription drug management with Crestor '40mg'$  daily and fenofibrate '145mg'$  daily>refilled   Aortic atherosclerosis/Dilated ascending aorta  -74m by CTA 05/2020 -continue statin -BP needs better control>>see above -chest MRI/MRA in Jan 2022 to followup on dilated aorta  Chronic diastolic CHF  -he aappears euvolemic on exam today -LVEPD was normal on cath  -only has to use Lasix sporadically.   SSS/Bradycardia -s/p PPM followed by Dr. TLovena Le   Signed, TFransico Him MD  02/06/2021 9:16 AM    [address]  [id number]: [phone] Fax: [phone]

## 2021-02-06 NOTE — Addendum Note (Signed)
Addended by: Nuala Alpha on: 02/06/2021 09:50 AM   Modules accepted: Orders

## 2021-02-06 NOTE — Addendum Note (Signed)
Addended by: Nuala Alpha on: 02/06/2021 09:54 AM   Modules accepted: Orders

## 2021-02-11 DIAGNOSIS — S86112D Strain of other muscle(s) and tendon(s) of posterior muscle group at lower leg level, left leg, subsequent encounter: Secondary | ICD-10-CM | POA: Diagnosis not present

## 2021-02-13 DIAGNOSIS — S86112D Strain of other muscle(s) and tendon(s) of posterior muscle group at lower leg level, left leg, subsequent encounter: Secondary | ICD-10-CM | POA: Diagnosis not present

## 2021-02-16 ENCOUNTER — Ambulatory Visit (INDEPENDENT_AMBULATORY_CARE_PROVIDER_SITE_OTHER): Payer: Medicare PPO

## 2021-02-16 DIAGNOSIS — I442 Atrioventricular block, complete: Secondary | ICD-10-CM | POA: Diagnosis not present

## 2021-02-17 DIAGNOSIS — S86112D Strain of other muscle(s) and tendon(s) of posterior muscle group at lower leg level, left leg, subsequent encounter: Secondary | ICD-10-CM | POA: Diagnosis not present

## 2021-02-18 LAB — CUP PACEART REMOTE DEVICE CHECK
Date Time Interrogation Session: 20220919111650
Implantable Lead Implant Date: 20201221
Implantable Lead Implant Date: 20201221
Implantable Lead Location: 753859
Implantable Lead Location: 753860
Implantable Lead Model: 377169
Implantable Lead Model: 377171
Implantable Lead Serial Number: 7000011949
Implantable Lead Serial Number: 81083606
Implantable Pulse Generator Implant Date: 20201221
Pulse Gen Model: 407145
Pulse Gen Serial Number: 69703841

## 2021-02-19 DIAGNOSIS — S86112D Strain of other muscle(s) and tendon(s) of posterior muscle group at lower leg level, left leg, subsequent encounter: Secondary | ICD-10-CM | POA: Diagnosis not present

## 2021-02-20 MED ORDER — AMLODIPINE BESYLATE 5 MG PO TABS: 7.5000 mg | ORAL_TABLET | Freq: Every day | ORAL | 3 refills | Status: DC

## 2021-02-23 NOTE — Progress Notes (Signed)
Remote pacemaker transmission.   

## 2021-02-24 DIAGNOSIS — S86112D Strain of other muscle(s) and tendon(s) of posterior muscle group at lower leg level, left leg, subsequent encounter: Secondary | ICD-10-CM | POA: Diagnosis not present

## 2021-02-27 DIAGNOSIS — S86112D Strain of other muscle(s) and tendon(s) of posterior muscle group at lower leg level, left leg, subsequent encounter: Secondary | ICD-10-CM | POA: Diagnosis not present

## 2021-03-02 DIAGNOSIS — S86112D Strain of other muscle(s) and tendon(s) of posterior muscle group at lower leg level, left leg, subsequent encounter: Secondary | ICD-10-CM | POA: Diagnosis not present

## 2021-03-03 MED ORDER — AMLODIPINE BESYLATE 10 MG PO TABS: 10.0000 mg | ORAL_TABLET | Freq: Every day | ORAL | 3 refills | Status: DC

## 2021-03-04 ENCOUNTER — Encounter: Payer: Self-pay | Admitting: Adult Health

## 2021-03-04 ENCOUNTER — Ambulatory Visit (INDEPENDENT_AMBULATORY_CARE_PROVIDER_SITE_OTHER): Payer: Medicare PPO | Admitting: Adult Health

## 2021-03-04 ENCOUNTER — Other Ambulatory Visit: Payer: Self-pay

## 2021-03-04 DIAGNOSIS — F331 Major depressive disorder, recurrent, moderate: Secondary | ICD-10-CM | POA: Diagnosis not present

## 2021-03-04 DIAGNOSIS — F429 Obsessive-compulsive disorder, unspecified: Secondary | ICD-10-CM

## 2021-03-04 DIAGNOSIS — F411 Generalized anxiety disorder: Secondary | ICD-10-CM

## 2021-03-04 MED ORDER — BREXPIPRAZOLE 1 MG PO TABS: 1.0000 mg | ORAL_TABLET | Freq: Every day | ORAL | 3 refills | Status: DC

## 2021-03-04 MED ORDER — FLUVOXAMINE MALEATE 100 MG PO TABS: 100.0000 mg | ORAL_TABLET | Freq: Two times a day (BID) | ORAL | 3 refills | Status: DC

## 2021-03-04 NOTE — Progress Notes (Signed)
Blake Carter [id number] [birth year] 70 y.o.  Subjective:   Patient ID:  Blake Carter is a 70 y.o. (DOB 12/23/1950) male.  Chief Complaint: No chief complaint on file.   HPI Blake Carter presents to the office today for follow-up of MDD, GAD and OCD.  Describes mood today as "ok". Pleasant. Denies tearfulness. Mood symptoms - denies depression, anxiety, and irritability. Decreased worry and rumination. Stating "I'm doing good". Improved interest and motivation. Taking medications as prescribed. Energy levels stable. Active, does not have a regular exercise routine. Walking dog. Enjoys some usual interests and activities. Married. Lives with wife of  52 years. Has 2 children - 1 son in [place] and 1 daughter in [place]. Spending time with family. Appetite adequate. Weight loss  - 250 pounds. Sleep has improved. Averages 6 to 8 hours. Napping during the day some days. Focus and concentration stable. Completing tasks. Managing aspects of household. Retired Forensic psychologist and professor.  Denies SI or HI.  Denies AH or VH.   PHQ2-9    Flowsheet Row CARDIAC REHAB PHASE II EXERCISE from 10/02/2014 in [place] from 07/08/2014 in [facility]  PHQ-2 Total Score 0 0        Review of Systems:  Review of Systems  Musculoskeletal:  Negative for gait problem.  Neurological:  Negative for tremors.  Psychiatric/Behavioral:         Please refer to HPI   Medications: I have reviewed the patient's current medications.  Current Outpatient Medications  Medication Sig Dispense Refill   acetaminophen (TYLENOL) 500 MG tablet Take 1,000 mg by mouth every 6 (six) hours as needed (pain).     albuterol (VENTOLIN HFA) 108 (90 Base) MCG/ACT inhaler Inhale 1-2 puffs into the lungs every 6 (six) hours as needed for wheezing or shortness of breath. 18 g 0   ALPRAZolam (XANAX) 0.25 MG tablet Take 1 tablet  (0.25 mg total) by mouth 2 (two) times daily as needed for anxiety. 60 tablet 2   amLODipine (NORVASC) 10 MG tablet Take 1 tablet (10 mg total) by mouth daily. 90 tablet 3   apixaban (ELIQUIS) 5 MG TABS tablet Take 1 tablet (5 mg total) by mouth 2 (two) times daily. 180 tablet 3   brexpiprazole (REXULTI) 1 MG TABS tablet Take 1 tablet (1 mg total) by mouth daily. 90 tablet 3   fenofibrate (TRICOR) 145 MG tablet Take 1 tablet (145 mg total) by mouth daily. 90 tablet 3   fexofenadine (ALLEGRA) 180 MG tablet Take 180 mg by mouth daily.     fluvoxaMINE (LUVOX) 100 MG tablet Take 1 tablet (100 mg total) by mouth 2 (two) times daily. 180 tablet 3   furosemide (LASIX) 20 MG tablet TAKE 1 TABLET BY MOUTH  DAILY AS NEEDED FOR FLUID OR EDEMA (Patient taking differently: Take 20 mg by mouth daily as needed for edema. TAKE 1 TABLET BY MOUTH  DAILY AS NEEDED FOR FLUID OR EDEMA) 90 tablet 0   latanoprost (XALATAN) 0.005 % ophthalmic solution Place 1 drop into both eyes at bedtime.      leuprolide, 6 Month, (ELIGARD) 45 MG injection Inject 45 mg into the skin every 6 (six) months.     levothyroxine (SYNTHROID) 175 MCG tablet Take 175 mcg by mouth See admin instructions. 128mcg on Saturdays and Sundays     levothyroxine (SYNTHROID) 200 MCG tablet Take 200 mcg by mouth. 253mcg Monday through Friday  lisinopril (ZESTRIL) 40 MG tablet Take 1 tablet (40 mg total) by mouth daily. 90 tablet 3   metoprolol succinate (TOPROL-XL) 100 MG 24 hr tablet Take 1 tablet (100 mg total) by mouth daily. Take with or immediately following a meal. 90 tablet 3   MYRBETRIQ 50 MG TB24 tablet Take 50 mg by mouth daily.     nitroGLYCERIN (NITROSTAT) 0.4 MG SL tablet Place 1 tablet (0.4 mg total) under the tongue every 5 (five) minutes as needed for chest pain. 25 tablet 4   pantoprazole (PROTONIX) 40 MG tablet Take 2 tablets (80 mg total) by mouth daily. 180 tablet 0   rosuvastatin (CRESTOR) 40 MG tablet Take 1 tablet (40 mg total) by  mouth daily. 90 tablet 3   No current facility-administered medications for this visit.    Medication Side Effects: None  Allergies:  Allergies  Allergen Reactions   Cimetidine Other (See Comments)    Unknown - over 30 years ago (2021) - unsure what occurred it may have been hives or flushing Other reaction(s): flushed   Atorvastatin     Myalgias on 80mg  dose   Niacin     Other reaction(s): intolerant   Niacin And Related Other (See Comments)    Flushing     Past Medical History:  Diagnosis Date   Allergic rhinitis    Aortic atherosclerosis ([place])    Arthritis    "a little bit in some of my joints" (06/20/2014)   Ascending aorta dilatation (HCC)    80mm by chest CTA 05/2020   Chronic diastolic CHF (congestive heart failure) ([place])    Coronary artery disease    a. 05/2014 Cath/PCI: LM nl, LAD 40p, 60-70d (FFR 0.80->3.0x24 Synergy DES), D2 40, LCX 90d (2.25x12 Synergy DES), RCA 20-4m, EF 60-65%. b. Cath 02/2017 without acute change, elev LVEDP suggestive of dCHF.   Depression    Dilated aortic root (HCC)    aortic root at 24mm by Chest CTA 11/2019   ED (erectile dysfunction)    Environmental allergies    dry cough   GERD (gastroesophageal reflux disease)    History of hiatal hernia    Hydronephrosis of right kidney    a. s/p ureteral stenting in the past.   Hyperlipidemia    under control   Hypertension    Hypothyroidism    OCD (obsessive compulsive disorder)    Pacemaker    PAF (paroxysmal atrial fibrillation) (HCC)    Panic disorder    PAT (paroxysmal atrial tachycardia) (HCC)    PONV (postoperative nausea and vomiting)    after thyroid surgery no problems in 10 years    Prostate cancer ([place])    prostate cancer- robotic prostatectomy - followed by radiation because of positive lymph node--and pt on lupron injections   PVC's (premature ventricular contractions)    Sinus bradycardia    a. chronic, asymptomatic.  24 hour Holter 10/2016 showed Sinus bradycardia, sinus  arrhythmia and normal sinus rhythm with average heart rate 50bpm and heart rate ranged from 32 to 100bpm.   SUI (stress urinary incontinence), male    S/P ROBOTIC PROSTATECTOMY AND RADIATION TX    Past Medical History, Surgical history, Social history, and Family history were reviewed and updated as appropriate.   Please see review of systems for further details on the patient's review from today.   Objective:   Physical Exam:  There were no vitals taken for this visit.  Physical Exam Constitutional:      General: He is not  in acute distress. Musculoskeletal:        General: No deformity.  Neurological:     Mental Status: He is alert and oriented to person, place, and time.     Coordination: Coordination normal.  Psychiatric:        Attention and Perception: Attention and perception normal. He does not perceive auditory or visual hallucinations.        Mood and Affect: Mood normal. Mood is not anxious or depressed. Affect is not labile, blunt, angry or inappropriate.        Speech: Speech normal.        Behavior: Behavior normal.        Thought Content: Thought content normal. Thought content is not paranoid or delusional. Thought content does not include homicidal or suicidal ideation. Thought content does not include homicidal or suicidal plan.        Cognition and Memory: Cognition and memory normal.        Judgment: Judgment normal.     Comments: Insight intact    Lab Review:     Component Value Date/Time   NA 143 08/28/2020 0815   K 4.5 08/28/2020 0815   CL 103 08/28/2020 0815   CO2 19 (L) 08/28/2020 0815   GLUCOSE 91 08/28/2020 0815   GLUCOSE 98 10/31/2018 1247   BUN 24 08/28/2020 0815   CREATININE 1.21 08/28/2020 0815   CREATININE 0.92 10/31/2018 1247   CREATININE 1.07 10/14/2015 0850   CALCIUM 9.2 08/28/2020 0815   PROT 6.9 08/28/2020 0815   ALBUMIN 4.6 08/28/2020 0815   AST 22 08/28/2020 0815   AST 19 10/31/2018 1247   ALT 23 08/28/2020 0815   ALT 25  10/31/2018 1247   ALKPHOS 49 08/28/2020 0815   BILITOT 0.2 08/28/2020 0815   BILITOT 0.3 10/31/2018 1247   GFRNONAA 60 07/03/2020 1146   GFRNONAA >60 10/31/2018 1247   GFRAA 69 07/03/2020 1146   GFRAA >60 10/31/2018 1247       Component Value Date/Time   WBC 5.5 07/03/2020 1146   WBC 6.1 12/26/2019 0931   RBC 4.07 (L) 07/03/2020 1146   RBC 4.11 (L) 12/26/2019 0931   HGB 10.5 (L) 07/08/2020 0826   HGB 11.4 (L) 07/03/2020 1146   HCT 31.0 (L) 07/08/2020 0826   HCT 35.1 (L) 07/03/2020 1146   PLT 280 07/03/2020 1146   MCV 86 07/03/2020 1146   MCH 28.0 07/03/2020 1146   MCH 29.1 10/31/2018 1247   MCHC 32.5 07/03/2020 1146   MCHC 33.3 12/26/2019 0931   RDW 14.5 07/03/2020 1146   LYMPHSABS 1.4 12/26/2019 0931   LYMPHSABS 1.7 05/07/2019 1319   MONOABS 0.6 12/26/2019 0931   EOSABS 0.3 12/26/2019 0931   EOSABS 0.2 05/07/2019 1319   BASOSABS 0.0 12/26/2019 0931   BASOSABS 0.0 05/07/2019 1319    No results found for: POCLITH, LITHIUM   No results found for: PHENYTOIN, PHENOBARB, VALPROATE, CBMZ   .res Assessment: Plan:    Plan:  PDMP reviewed  1. Luvox 200mg  daily 2. Continue Rexulti 1mg  daily  Consider therapy  RTC 6 months  Patient advised to contact office with any questions, adverse effects, or acute worsening in signs and symptoms.  Discussed potential benefits, risk, and side effects of benzodiazepines to include potential risk of tolerance and dependence, as well as possible drowsiness.  Advised patient not to drive if experiencing drowsiness and to take lowest possible effective dose to minimize risk of dependence and tolerance.  Diagnoses and all orders for  this visit:  Major depressive disorder, recurrent episode, moderate (HCC) -     brexpiprazole (REXULTI) 1 MG TABS tablet; Take 1 tablet (1 mg total) by mouth daily.  Obsessive-compulsive disorder, unspecified type -     fluvoxaMINE (LUVOX) 100 MG tablet; Take 1 tablet (100 mg total) by mouth 2 (two)  times daily.  Generalized anxiety disorder -     fluvoxaMINE (LUVOX) 100 MG tablet; Take 1 tablet (100 mg total) by mouth 2 (two) times daily.    Please see After Visit Summary for patient specific instructions.  Future Appointments  Date Time Provider [place]  05/18/2021  7:05 AM [facility] DEVICE REMOTES [facility]  06/08/2021  1:00 PM [facility] 1 [facility]  08/11/2021  9:00 AM Sueanne Margarita, MD [facility]  08/17/2021  7:05 AM [facility] DEVICE REMOTES [facility]  11/16/2021  7:05 AM [facility] DEVICE REMOTES [facility]    No orders of the defined types were placed in this encounter.   -------------------------------

## 2021-03-05 ENCOUNTER — Other Ambulatory Visit: Payer: Self-pay | Admitting: Internal Medicine

## 2021-03-06 DIAGNOSIS — S86112D Strain of other muscle(s) and tendon(s) of posterior muscle group at lower leg level, left leg, subsequent encounter: Secondary | ICD-10-CM | POA: Diagnosis not present

## 2021-03-10 DIAGNOSIS — S86112D Strain of other muscle(s) and tendon(s) of posterior muscle group at lower leg level, left leg, subsequent encounter: Secondary | ICD-10-CM | POA: Diagnosis not present

## 2021-03-12 DIAGNOSIS — S86112D Strain of other muscle(s) and tendon(s) of posterior muscle group at lower leg level, left leg, subsequent encounter: Secondary | ICD-10-CM | POA: Diagnosis not present

## 2021-03-13 ENCOUNTER — Other Ambulatory Visit (HOSPITAL_COMMUNITY): Payer: Medicare PPO

## 2021-03-17 DIAGNOSIS — S86112D Strain of other muscle(s) and tendon(s) of posterior muscle group at lower leg level, left leg, subsequent encounter: Secondary | ICD-10-CM | POA: Diagnosis not present

## 2021-03-19 DIAGNOSIS — S86112D Strain of other muscle(s) and tendon(s) of posterior muscle group at lower leg level, left leg, subsequent encounter: Secondary | ICD-10-CM | POA: Diagnosis not present

## 2021-03-25 DIAGNOSIS — C61 Malignant neoplasm of prostate: Secondary | ICD-10-CM | POA: Diagnosis not present

## 2021-04-01 DIAGNOSIS — C61 Malignant neoplasm of prostate: Secondary | ICD-10-CM | POA: Diagnosis not present

## 2021-05-13 DIAGNOSIS — E890 Postprocedural hypothyroidism: Secondary | ICD-10-CM | POA: Diagnosis not present

## 2021-05-13 DIAGNOSIS — R7309 Other abnormal glucose: Secondary | ICD-10-CM | POA: Diagnosis not present

## 2021-05-18 ENCOUNTER — Ambulatory Visit (INDEPENDENT_AMBULATORY_CARE_PROVIDER_SITE_OTHER): Payer: Medicare PPO

## 2021-05-18 DIAGNOSIS — I442 Atrioventricular block, complete: Secondary | ICD-10-CM | POA: Diagnosis not present

## 2021-05-19 LAB — CUP PACEART REMOTE DEVICE CHECK
Date Time Interrogation Session: 20221219150432
Implantable Lead Implant Date: 20201221
Implantable Lead Implant Date: 20201221
Implantable Lead Location: 753859
Implantable Lead Location: 753860
Implantable Lead Model: 377169
Implantable Lead Model: 377171
Implantable Lead Serial Number: 7000011949
Implantable Lead Serial Number: 81083606
Implantable Pulse Generator Implant Date: 20201221
Pulse Gen Model: 407145
Pulse Gen Serial Number: 69703841

## 2021-05-20 DIAGNOSIS — E890 Postprocedural hypothyroidism: Secondary | ICD-10-CM | POA: Diagnosis not present

## 2021-05-20 DIAGNOSIS — R7309 Other abnormal glucose: Secondary | ICD-10-CM | POA: Diagnosis not present

## 2021-05-20 DIAGNOSIS — I10 Essential (primary) hypertension: Secondary | ICD-10-CM | POA: Diagnosis not present

## 2021-05-21 DIAGNOSIS — H401131 Primary open-angle glaucoma, bilateral, mild stage: Secondary | ICD-10-CM | POA: Diagnosis not present

## 2021-05-21 DIAGNOSIS — H524 Presbyopia: Secondary | ICD-10-CM | POA: Diagnosis not present

## 2021-05-21 DIAGNOSIS — H5211 Myopia, right eye: Secondary | ICD-10-CM | POA: Diagnosis not present

## 2021-05-21 DIAGNOSIS — H401111 Primary open-angle glaucoma, right eye, mild stage: Secondary | ICD-10-CM | POA: Diagnosis not present

## 2021-05-21 DIAGNOSIS — H52223 Regular astigmatism, bilateral: Secondary | ICD-10-CM | POA: Diagnosis not present

## 2021-05-28 NOTE — Progress Notes (Signed)
Remote pacemaker transmission.   

## 2021-06-08 ENCOUNTER — Ambulatory Visit (HOSPITAL_COMMUNITY)
Admission: RE | Admit: 2021-06-08 | Discharge: 2021-06-08 | Disposition: A | Discharge status: Home / Self Care | Payer: Medicare PPO | Source: Ambulatory Visit | Attending: Cardiology | Admitting: Cardiology

## 2021-06-08 ENCOUNTER — Other Ambulatory Visit: Payer: Self-pay

## 2021-06-08 DIAGNOSIS — I2583 Coronary atherosclerosis due to lipid rich plaque: Secondary | ICD-10-CM | POA: Insufficient documentation

## 2021-06-08 DIAGNOSIS — I5032 Chronic diastolic (congestive) heart failure: Secondary | ICD-10-CM | POA: Diagnosis not present

## 2021-06-08 DIAGNOSIS — I495 Sick sinus syndrome: Secondary | ICD-10-CM | POA: Insufficient documentation

## 2021-06-08 DIAGNOSIS — I10 Essential (primary) hypertension: Secondary | ICD-10-CM | POA: Diagnosis not present

## 2021-06-08 DIAGNOSIS — E7800 Pure hypercholesterolemia, unspecified: Secondary | ICD-10-CM | POA: Diagnosis not present

## 2021-06-08 DIAGNOSIS — I480 Paroxysmal atrial fibrillation: Secondary | ICD-10-CM | POA: Insufficient documentation

## 2021-06-08 DIAGNOSIS — I2510 Atherosclerotic heart disease of native coronary artery without angina pectoris: Secondary | ICD-10-CM | POA: Insufficient documentation

## 2021-06-08 DIAGNOSIS — R918 Other nonspecific abnormal finding of lung field: Secondary | ICD-10-CM | POA: Diagnosis not present

## 2021-06-08 DIAGNOSIS — I77819 Aortic ectasia, unspecified site: Secondary | ICD-10-CM | POA: Diagnosis not present

## 2021-06-08 MED ORDER — GADOBUTROL 1 MMOL/ML IV SOLN
10.0000 mL | Freq: Once | INTRAVENOUS | Status: AC | PRN
  Administered 2021-06-08: 10 mL via INTRAVENOUS

## 2021-06-08 NOTE — Progress Notes (Signed)
Device system confirmed to be MRI conditional, with implant date > 6 weeks ago, and no evidence of abandoned or epicardial leads in review of most recent CXR Interrogation from today reviewed in-person by Device rep.   Device settings for MRI set to Auto-Detect for MRI with DOO 90.

## 2021-06-09 ENCOUNTER — Encounter: Payer: Self-pay | Admitting: Cardiology

## 2021-07-15 DIAGNOSIS — C61 Malignant neoplasm of prostate: Secondary | ICD-10-CM | POA: Diagnosis not present

## 2021-07-21 DIAGNOSIS — E890 Postprocedural hypothyroidism: Secondary | ICD-10-CM | POA: Diagnosis not present

## 2021-08-10 NOTE — Progress Notes (Unsigned)
Cardiology Office Note    Date:  08/10/2021   ID:  Blake Carter, DOB 1951/03/31, MRN [id number]  PCP:  [facility] @ [place]  Cardiologist:  Fransico Him, MD  Electrophysiologist:  Cristopher Peru, MD   Chief Complaint: f/u PAF, CAD, HTN, HLD  History of Present Illness:   Blake Carter is a 71 y.o. 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  Last cath was in 2018 showing patent stents, moderately elevated LVEDP. He underwent pacemaker implantation in 2020 for progressive bradycardia. Last echo 11/2019 EF 60-65%, grade 1 DD, mild AI (normal valve structure), moderate dilation of ascending aorta (53m) with CTA showing 4.4cm.  Was seen in the office 01/30/20 with episodic palpitations associated with SOB and chest discomfort. He wore a monitor showing NSR/ST average HR 67bpm, nonsustained atrial tachycardia, intermittent V pacing, occsaional PVCs, bigeminal PVCs, trigeminal PVCs and ventricular couplets and triplets, and wide complex tachycardia up to 5 beats. He was started on metoprolol. Nuclear stress test was normal. It does appear that on pacemaker interrogation on 01/26/20 the patient had actually had an episode of atrial fibrillation (prior to event monitor) therefore he was started on anticoagulation. He had itching with Eliquis so was changed to Xarelto.  He underwent cath 07/2020 showing widely patent LCx and LAD stents, normal RCA and LM and diffuse nonobstructive disease of the PDA.  There was a 30% stenosis prior to the mid LAD stent and 25-30% in distal LCx.  FFR was normal with no evidence of microvascular disease and LVEDP was normal.  Medical management was recommended.    He is here today for followup and is doing well.  He denies any chest pain or pressure, SOB, DOE, PND, orthopnea, LE edema, dizziness, palpitations or syncope. He is compliant with his meds and is tolerating meds with no SE.        Past Medical History:  Diagnosis Date   Allergic rhinitis    [place]ortic [place]    Arthritis    "a little bit in some of my joints" (06/20/2014)   Ascending aorta dilatation (HCC)    447mby chest CTA 05/2020 and 42 mm by chest MRI MRA 05/2021   Chronic diastolic CHF (congestive heart failure) ([place]   Coronary artery disease    a. 05/2014 Cath/PCI: LM nl, LAD 40p, 60-70d (FFR 0.80->3.0x24 Synergy DES), D2 40, LCX 90d (2.25x12 Synergy DES), RCA 20-3028mF 60-65%. b. Cath 02/2017 without acute change, elev LVEDP suggestive of dCHF.   Depression    Dilated aortic root (HCC)    aortic root at 71m39m Chest CTA 11/2019   ED (erectile dysfunction)    Environmental allergies    dry cough   GERD (gastroesophageal reflux disease)    History of hiatal hernia    Hydronephrosis of right kidney    a. s/p ureteral stenting in the past.   Hyperlipidemia    under control   Hypertension    Hypothyroidism    OCD (obsessive compulsive disorder)    Pacemaker    PAF (paroxysmal atrial fibrillation) (HCC)    Panic disorder    PAT (paroxysmal atrial tachycardia) (HCC)    PONV (postoperative nausea and vomiting)  after thyroid surgery no problems in 10 years    Prostate cancer [facility])    prostate cancer- robotic prostatectomy - followed by radiation because of positive lymph node--and pt on lupron injections   PVC's (premature ventricular contractions)    Sinus bradycardia    a. chronic, asymptomatic.  24 hour Holter 10/2016 showed Sinus bradycardia, sinus arrhythmia and normal sinus rhythm with average heart rate 50bpm and heart rate ranged from 32 to 100bpm.   SUI (stress urinary incontinence), male    S/P ROBOTIC  PROSTATECTOMY AND RADIATION TX    Past Surgical History:  Procedure Laterality Date   COLONOSCOPY  07/2013   Dr. Oletta Lamas   CORONARY ANGIOPLASTY WITH STENT PLACEMENT  06/20/2014   "2"   CYSTOSCOPY  11/30/2011   Procedure: CYSTOSCOPY;  Surgeon: Reece Packer, MD;  Location: [facility];  Service: Urology;  Laterality: N/A;  Inplantation of Artificial Sphincter and Cystoscopy   CYSTOSCOPY WITH RETROGRADE PYELOGRAM, URETEROSCOPY AND STENT PLACEMENT Right 02/11/2014   Procedure: CYSTOSCOPY WITH RETROGRADE PYELOGRAM, URETEROSCOPY ,URETERAL BIOPSY AND STENT PLACEMENT;  Surgeon: Raynelle Bring, MD;  Location: [facility];  Service: Urology;  Laterality: Right;   CYSTOSCOPY WITH RETROGRADE PYELOGRAM, URETEROSCOPY AND STENT PLACEMENT Right 04/15/2014   Procedure: CYSTOSCOPY WITH RETROGRADE PYELOGRAM, URETEROSCOPY, BIOPSY AND STENT PLACEMENT;  Surgeon: Raynelle Bring, MD;  Location: [facility];  Service: Urology;  Laterality: Right;   INTRAVASCULAR PRESSURE WIRE/FFR STUDY N/A 07/08/2020   Procedure: INTRAVASCULAR PRESSURE WIRE/FFR STUDY;  Surgeon: Belva Crome, MD;  Location: [place] CV LAB;  Service: Cardiovascular;  Laterality: N/A;   LEFT HEART CATH AND CORONARY ANGIOGRAPHY N/A 03/29/2017   Procedure: LEFT HEART CATH AND CORONARY ANGIOGRAPHY;  Surgeon: Leonie Man, MD;  Location: [place] CV LAB;  Service: Cardiovascular;  Laterality: N/A;   LEFT HEART CATHETERIZATION WITH CORONARY ANGIOGRAM N/A 06/20/2014   Procedure: LEFT HEART CATHETERIZATION WITH CORONARY ANGIOGRAM;  Surgeon: Leonie Man, MD;  Location: [facility] CATH LAB;  Service: Cardiovascular;  Laterality: N/A;   PACEMAKER IMPLANT N/A 05/21/2019   Procedure: PACEMAKER IMPLANT;  Surgeon: Evans Lance, MD;  Location: [place] CV LAB;  Service: Cardiovascular;  Laterality: N/A;   PERCUTANEOUS CORONARY STENT INTERVENTION (PCI-S)  06/20/2014   Procedure: PERCUTANEOUS CORONARY STENT INTERVENTION (PCI-S);  Surgeon: Leonie Man, MD;  Location: [facility]  CATH LAB;  Service: Cardiovascular;;  LAD and Circumflex   REFRACTIVE SURGERY Bilateral 2004   RIGHT/LEFT HEART CATH AND CORONARY ANGIOGRAPHY N/A 07/08/2020   Procedure: RIGHT/LEFT HEART CATH AND CORONARY ANGIOGRAPHY;  Surgeon: Belva Crome, MD;  Location: [place] CV LAB;  Service: Cardiovascular;  Laterality: N/A;   ROBOT ASSISTED LAPAROSCOPIC RADICAL PROSTATECTOMY  05/2010   THYROIDECTOMY, PARTIAL  10/1974   TONSILLECTOMY  1956    TOTAL THYROIDECTOMY  10/2012   "nuked it"   UPPER GI ENDOSCOPY     food impaction 2009 done by Dr Oletta Lamas   URINARY SPHINCTER IMPLANT  11/30/2011   Procedure: ARTIFICIAL URINARY SPHINCTER;  Surgeon: Reece Packer, MD;  Location: [facility];  Service: Urology;  Laterality: N/A;    Current Medications: Current Meds  Medication Sig   acetaminophen (TYLENOL) 500 MG tablet Take 1,000 mg by mouth every 6 (six) hours as needed (pain).   Cholecalciferol (VITAMIN D3) 2000 units TABS Take 6,000 Units by mouth 3 (three) times a week.    diazepam (VALIUM) 5 MG tablet Take 1 tablet (5 mg total) by mouth at bedtime.   fenofibrate (TRICOR) 145 MG tablet Take  1 tablet (145 mg total) by mouth daily.   fexofenadine (ALLEGRA) 180 MG tablet Take 180 mg by mouth daily.   fluvoxaMINE (LUVOX) 50 MG tablet Take 200 mg by mouth every morning.    furosemide (LASIX) 20 MG tablet TAKE 1 TABLET BY MOUTH  DAILY AS NEEDED FOR FLUID  OR EDEMA   latanoprost (XALATAN) 0.005 % ophthalmic solution Place 1 drop into both eyes at bedtime.    leuprolide, 6 Month, (ELIGARD) 45 MG injection Inject 45 mg into the skin every 6 (six) months.   levothyroxine (SYNTHROID) 175 MCG tablet PT TAKES 1 TABLET BY MOUTH DAILY EXCEPT SUNDAYS   lisinopril (ZESTRIL) 10 MG tablet Take 1 tablet by mouth daily.   MYRBETRIQ 50 MG TB24 tablet Take 50 mg by mouth daily.   nitroGLYCERIN (NITROSTAT) 0.4 MG SL tablet PLACE 1 TABLET (0.4 MG TOTAL) UNDER THE TONGUE EVERY 5 (FIVE) MINUTES AS NEEDED FOR CHEST PAIN.    pantoprazole (PROTONIX) 40 MG tablet Take 2 tablets (80 mg total) by mouth daily.   rivaroxaban (XARELTO) 20 MG TABS tablet Take 1 tablet (20 mg total) by mouth daily with supper.   amLODipine (NORVASC) 10 MG tablet Take 1 tablet (10 mg total) by mouth every evening.   metoprolol succinate (TOPROL XL) 25 MG 24 hr tablet Take 1 tablet (25 mg total) by mouth daily.        Allergies:   Cimetidine, Atorvastatin, Niacin, and Niacin and related   Social History   Socioeconomic History   Marital status: Married    Spouse name: Not on file   Number of children: Not on file   Years of education: Not on file   Highest education level: Not on file  Occupational History   Not on file  Tobacco Use   Smoking status: Never   Smokeless tobacco: Never  Vaping Use   Vaping Use: Never used  Substance and Sexual Activity   Alcohol use: Yes    Alcohol/week: 0.0 standard drinks    Comment: 06/20/2014 "1-2 drinks a couple times/month"   Drug use: No   Sexual activity: Yes  Other Topics Concern   Not on file  Social History Narrative   Not on file   Social Determinants of Health   Financial Resource Strain: Not on file  Food Insecurity: Not on file  Transportation Needs: Not on file  Physical Activity: Not on file  Stress: Not on file  Social Connections: Not on file     Family History:  The patient's family history includes CAD in his brother; Cancer in his father; Heart disease in his father; Hyperlipidemia in his father.  ROS:   Please see the history of present illness. Denies any unusual bleeding on Eliquis. All other systems are reviewed and otherwise negative.    EKGs/Labs/Other Studies Reviewed:    Studies reviewed are outlined and summarized above. Reports included below if pertinent.  Cardiac Cath 07/08/2020 Conclusion  Widely patent LAD and circumflex stents. Widely patent left main Widely patent circumflex. The left anterior descending contains 30% stenosis proximal  to the mid stent.  Hemodynamic assessment was performed with the Pressure Wire X demonstrating an RFR of 0.9 and an FFR of 0.82.  A full hemodynamic assessment to exclude microvascular dysfunction was performed.   Right coronary is widely patent with luminal irregularities.  There is diffuse disease in the PDA but without focal high-grade obstruction. Left ventricular size is normal.  EF is 55%.  EDP is normal. Right heart  pressures are normal.   RECOMMENDATIONS:   No evidence of significant epicardial disease or microvascular dysfunction. Continue aggressive risk factor modification. Initiate an exercise program and weight loss.    2D echo 713/21 IMPRESSIONS     1. Left ventricular ejection fraction, by estimation, is 60 to 65%. The  left ventricle has normal function. The left ventricle has no regional  wall motion abnormalities. Left ventricular diastolic parameters are  consistent with Grade I diastolic  dysfunction (impaired relaxation).   2. Right ventricular systolic function is normal. The right ventricular  size is normal. There is normal pulmonary artery systolic pressure.   3. The mitral valve is normal in structure. No evidence of mitral valve  regurgitation. No evidence of mitral stenosis.   4. The aortic valve is normal in structure. Aortic valve regurgitation is  mild. No aortic stenosis is present.   5. Aortic dilatation noted. There is moderate dilatation of the ascending  aorta measuring 46 mm.   6. The inferior vena cava is normal in size with greater than 50%  respiratory variability, suggesting right atrial pressure of 3 mmHg.   Comparison(s): Prior ECHO 42 mm ascending aorta. Current 45 mm. Consider  CTA of aorta for comparison.     EKG:  EKG is ordered today and demonstrates ***  Recent Labs: 08/28/2020: ALT 23; BUN 24; Creatinine, Ser 1.21; Potassium 4.5; Sodium 143  Recent Lipid Panel    Component Value Date/Time   CHOL 116 08/28/2020 0000   TRIG  148 08/28/2020 0000   HDL 39 (L) 08/28/2020 0000   CHOLHDL 3.0 08/28/2020 0000   CHOLHDL 3.8 03/29/2017 0554   VLDL 34 03/29/2017 0554   LDLCALC 52 08/28/2020 0000    PHYSICAL EXAM:    VS:  There were no vitals taken for this visit.  BMI: There is no height or weight on file to calculate BMI.  GEN: Well nourished, well developed in no acute distress HEENT: Normal NECK: No JVD; No carotid bruits LYMPHATICS: No lymphadenopathy CARDIAC:RRR, no murmurs, rubs, gallops RESPIRATORY:  Clear to auscultation without rales, wheezing or rhonchi  ABDOMEN: Soft, non-tender, non-distended MUSCULOSKELETAL:  No edema; No deformity  SKIN: Warm and dry NEUROLOGIC:  Alert and oriented x 3 PSYCHIATRIC:  Normal affect    Wt Readings from Last 3 Encounters:  02/06/21 256 lb 3.2 oz (116.2 kg)  07/23/20 262 lb (118.8 kg)  07/22/20 263 lb (119.3 kg)     ASSESSMENT & PLAN:    Paroxysmal atrial fibrillation  -he is maintaining NSR on exam today and denies any palpitations and no bleeding issues on DOAC -continue prescription drug management with Apixaban '5mg'$  BID and Toprol XL '100mg'$  daily with PRN refills -I have personally reviewed and interpreted outside labs performed by patient's PCP which showed ***   ASCAD  -Cardiac cath for exertional angina 07/2020 showed widely patent LAD and LCX stents with 30% stenosis proximal to mid LAD stent and diffuse nonobstructive disease in the PDA with 25-30% distal LCx , no other CAD and no evidence of microvascular disease.  LVEDP was normal -he denies any angina since I saw him last -continue prescription drug management with Toprol XL '100mg'$  daily, high dose statin -no ASA due to DOAC  Essential HTN  -BP is well controlled on exam today -continue prescription drug management with Toprol XL '100mg'$  daily, Lisinopril '40mg'$  daily and Amlodipine '5mg'$  daily with PRN refills -continue chlo Hyperlipidemia  -I have personally reviewed and interpreted outside labs  performed by patient's  PCP which showed *** -continue prescription drug management with Crestor '40mg'$  daily and fenofibrate '145mg'$  daily with PRN refills  Aortic atherosclerosis/Dilated ascending aorta  -4m by CTA 05/2020 -4107mby chest MRI/MRA 05/2021 -repeat MRI/MRA in 1 year -continue statin -BP is controlled  Chronic diastolic CHF  -he appears euvolemic on exam today -LVEPD was normal on cath  -only has to use Lasix sporadically.   SSS/Bradycardia -s/p PPM followed by Dr. TaLovena Le  Signed, TrFransico HimMD  08/10/2021 5:49 PM    [place]  [id number]: [phone]: [phone]

## 2021-08-11 ENCOUNTER — Encounter: Payer: Self-pay | Admitting: Cardiology

## 2021-08-11 ENCOUNTER — Other Ambulatory Visit: Payer: Self-pay

## 2021-08-11 ENCOUNTER — Ambulatory Visit: Payer: Medicare PPO | Admitting: Cardiology

## 2021-08-11 VITALS — BP 124/70 | HR 75 | Ht 71.0 in | Wt 258.2 lb

## 2021-08-11 DIAGNOSIS — I2583 Coronary atherosclerosis due to lipid rich plaque: Secondary | ICD-10-CM | POA: Diagnosis not present

## 2021-08-11 DIAGNOSIS — I77819 Aortic ectasia, unspecified site: Secondary | ICD-10-CM | POA: Diagnosis not present

## 2021-08-11 DIAGNOSIS — I10 Essential (primary) hypertension: Secondary | ICD-10-CM

## 2021-08-11 DIAGNOSIS — I700 Atherosclerosis of aorta: Secondary | ICD-10-CM

## 2021-08-11 DIAGNOSIS — I495 Sick sinus syndrome: Secondary | ICD-10-CM

## 2021-08-11 DIAGNOSIS — I480 Paroxysmal atrial fibrillation: Secondary | ICD-10-CM

## 2021-08-11 DIAGNOSIS — I5032 Chronic diastolic (congestive) heart failure: Secondary | ICD-10-CM | POA: Diagnosis not present

## 2021-08-11 DIAGNOSIS — I2510 Atherosclerotic heart disease of native coronary artery without angina pectoris: Secondary | ICD-10-CM | POA: Diagnosis not present

## 2021-08-11 DIAGNOSIS — E7800 Pure hypercholesterolemia, unspecified: Secondary | ICD-10-CM

## 2021-08-11 LAB — BASIC METABOLIC PANEL
BUN/Creatinine Ratio: 22 (ref 10–24)
BUN: 23 mg/dL (ref 8–27)
CO2: 22 mmol/L (ref 20–29)
Calcium: 9.6 mg/dL (ref 8.6–10.2)
Chloride: 107 mmol/L — ABNORMAL HIGH (ref 96–106)
Creatinine, Ser: 1.03 mg/dL (ref 0.76–1.27)
Glucose: 92 mg/dL (ref 70–99)
Potassium: 4.3 mmol/L (ref 3.5–5.2)
Sodium: 143 mmol/L (ref 134–144)
eGFR: 79 mL/min/{1.73_m2} (ref >59)

## 2021-08-11 LAB — CBC
Hematocrit: 34.5 % — ABNORMAL LOW (ref 37.5–51.0)
Hemoglobin: 11.5 g/dL — ABNORMAL LOW (ref 13.0–17.7)
MCH: 28.8 pg (ref 26.6–33.0)
MCHC: 33.3 g/dL (ref 31.5–35.7)
MCV: 86 fL (ref 79–97)
Platelets: 243 10*3/uL (ref 150–450)
RBC: 4 x10E6/uL — ABNORMAL LOW (ref 4.14–5.80)
RDW: 13.2 % (ref 11.6–15.4)
WBC: 4.4 10*3/uL (ref 3.4–10.8)

## 2021-08-11 NOTE — Addendum Note (Signed)
Addended by: Antonieta Iba on: 08/11/2021 09:31 AM ? ? Modules accepted: Orders ? ?

## 2021-08-11 NOTE — Patient Instructions (Signed)
Medication Instructions:  Your physician recommends that you continue on your current medications as directed. Please refer to the Current Medication list given to you today.  *If you need a refill on your cardiac medications before your next appointment, please call your pharmacy*  Lab Work: TODAY: BMET and CBC If you have labs (blood work) drawn today and your tests are completely normal, you will receive your results only by: . MyChart Message (if you have MyChart) OR . A paper copy in the mail If you have any lab test that is abnormal or we need to change your treatment, we will call you to review the results.   Follow-Up: At [facility], you and your health needs are our priority.  As part of our continuing mission to provide you with exceptional heart care, we have created designated Provider Care Teams.  These Care Teams include your primary Cardiologist (physician) and Advanced Practice Providers (APPs -  Physician Assistants and Nurse Practitioners) who all work together to provide you with the care you need, when you need it.  Your next appointment:   6 month(s)  The format for your next appointment:   In Person  Provider:   Traci Turner, MD  

## 2021-08-12 DIAGNOSIS — D649 Anemia, unspecified: Secondary | ICD-10-CM | POA: Diagnosis not present

## 2021-08-12 DIAGNOSIS — E559 Vitamin D deficiency, unspecified: Secondary | ICD-10-CM | POA: Diagnosis not present

## 2021-08-13 DIAGNOSIS — Z0000 Encounter for general adult medical examination without abnormal findings: Secondary | ICD-10-CM | POA: Diagnosis not present

## 2021-08-13 DIAGNOSIS — I10 Essential (primary) hypertension: Secondary | ICD-10-CM | POA: Diagnosis not present

## 2021-08-13 DIAGNOSIS — H5211 Myopia, right eye: Secondary | ICD-10-CM | POA: Diagnosis not present

## 2021-08-13 DIAGNOSIS — E7800 Pure hypercholesterolemia, unspecified: Secondary | ICD-10-CM | POA: Diagnosis not present

## 2021-08-13 DIAGNOSIS — Z6836 Body mass index (BMI) 36.0-36.9, adult: Secondary | ICD-10-CM | POA: Diagnosis not present

## 2021-08-13 DIAGNOSIS — H401131 Primary open-angle glaucoma, bilateral, mild stage: Secondary | ICD-10-CM | POA: Diagnosis not present

## 2021-08-13 DIAGNOSIS — E559 Vitamin D deficiency, unspecified: Secondary | ICD-10-CM | POA: Diagnosis not present

## 2021-08-13 DIAGNOSIS — T7840XS Allergy, unspecified, sequela: Secondary | ICD-10-CM | POA: Diagnosis not present

## 2021-08-13 DIAGNOSIS — D649 Anemia, unspecified: Secondary | ICD-10-CM | POA: Diagnosis not present

## 2021-08-13 DIAGNOSIS — E039 Hypothyroidism, unspecified: Secondary | ICD-10-CM | POA: Diagnosis not present

## 2021-08-13 DIAGNOSIS — R7303 Prediabetes: Secondary | ICD-10-CM | POA: Diagnosis not present

## 2021-08-13 DIAGNOSIS — E119 Type 2 diabetes mellitus without complications: Secondary | ICD-10-CM | POA: Diagnosis not present

## 2021-08-17 ENCOUNTER — Ambulatory Visit (INDEPENDENT_AMBULATORY_CARE_PROVIDER_SITE_OTHER): Payer: Medicare PPO

## 2021-08-17 DIAGNOSIS — I495 Sick sinus syndrome: Secondary | ICD-10-CM

## 2021-08-18 LAB — CUP PACEART REMOTE DEVICE CHECK
Date Time Interrogation Session: 20230320105846
Implantable Lead Implant Date: 20201221
Implantable Lead Implant Date: 20201221
Implantable Lead Location: 753859
Implantable Lead Location: 753860
Implantable Lead Model: 377169
Implantable Lead Model: 377171
Implantable Lead Serial Number: 7000011949
Implantable Lead Serial Number: 81083606
Implantable Pulse Generator Implant Date: 20201221
Pulse Gen Model: 407145
Pulse Gen Serial Number: 69703841

## 2021-09-01 NOTE — Progress Notes (Signed)
Remote pacemaker transmission.   

## 2021-09-02 ENCOUNTER — Encounter: Payer: Self-pay | Admitting: Adult Health

## 2021-09-02 ENCOUNTER — Ambulatory Visit: Payer: Medicare PPO | Admitting: Adult Health

## 2021-09-02 DIAGNOSIS — F331 Major depressive disorder, recurrent, moderate: Secondary | ICD-10-CM

## 2021-09-02 DIAGNOSIS — F411 Generalized anxiety disorder: Secondary | ICD-10-CM | POA: Diagnosis not present

## 2021-09-02 DIAGNOSIS — F429 Obsessive-compulsive disorder, unspecified: Secondary | ICD-10-CM

## 2021-09-02 NOTE — Progress Notes (Signed)
Quanah Majka ?[id number] ?[birth year] ?71 y.o. ? ?Subjective:  ? ?Patient ID:  Eligio Angert is a 71 y.o. (DOB 12/14/1950) male. ? ?Chief Complaint: No chief complaint on file. ? ? ?HPI ?Indalecio Malmstrom presents to the office today for follow-up of MDD, GAD and OCD. ? ?Describes mood today as "ok". Pleasant. Denies tearfulness. Mood symptoms - denies depression, anxiety, and irritability. Denies worry and rumination. Stating "I'm doing pretty good right now". Feels like medications continue to work well. Improved interest and motivation. Taking medications as prescribed. ?Energy levels stable. Active, walking 2 miles 3 to 4 times a week. ?Enjoys some usual interests and activities. Married. Lives with wife. Has 2 grown children. Spending time with family. ?Appetite adequate. Weight gain 4 to 5 pounds - 250 pounds. ?Sleeps well most nights. Averages 8 hours - taking a while to get to sleep. Napping during the day some days. ?Focus and concentration stable. Completing tasks. Managing aspects of household. Retired Forensic psychologist and professor.  ?Denies SI or HI.  ?Denies AH or VH. ? ? ? ? ?PHQ2-9   ? ?Flowsheet Row CARDIAC REHAB PHASE II EXERCISE from 10/02/2014 in [facility] from 07/08/2014 in [facility]  ?PHQ-2 Total Score 0 0  ? ?  ?  ? ?Review of Systems:  ?Review of Systems  ?Musculoskeletal:  Negative for gait problem.  ?Neurological:  Negative for tremors.  ?Psychiatric/Behavioral:    ?     Please refer to HPI  ? ?Medications: I have reviewed the patient's current medications. ? ?Current Outpatient Medications  ?Medication Sig Dispense Refill  ? acetaminophen (TYLENOL) 500 MG tablet Take 1,000 mg by mouth every 6 (six) hours as needed (pain).    ? albuterol (VENTOLIN HFA) 108 (90 Base) MCG/ACT inhaler Inhale 1-2 puffs into the lungs every 6 (six) hours as needed for wheezing or shortness of breath. (Patient not taking:  Reported on 08/11/2021) 18 g 0  ? ALPRAZolam (XANAX) 0.25 MG tablet Take 1 tablet (0.25 mg total) by mouth 2 (two) times daily as needed for anxiety. 60 tablet 2  ? amLODipine (NORVASC) 10 MG tablet Take 1 tablet (10 mg total) by mouth daily. 90 tablet 3  ? apixaban (ELIQUIS) 5 MG TABS tablet Take 1 tablet (5 mg total) by mouth 2 (two) times daily. 180 tablet 3  ? brexpiprazole (REXULTI) 1 MG TABS tablet Take 1 tablet (1 mg total) by mouth daily. 90 tablet 3  ? fenofibrate (TRICOR) 145 MG tablet Take 1 tablet (145 mg total) by mouth daily. 90 tablet 3  ? fexofenadine (ALLEGRA) 180 MG tablet Take 180 mg by mouth daily. (Patient not taking: Reported on 08/11/2021)    ? fluvoxaMINE (LUVOX) 100 MG tablet Take 1 tablet (100 mg total) by mouth 2 (two) times daily. 180 tablet 3  ? furosemide (LASIX) 20 MG tablet TAKE 1 TABLET BY MOUTH  DAILY AS NEEDED FOR FLUID OR EDEMA 90 tablet 0  ? latanoprost (XALATAN) 0.005 % ophthalmic solution Place 1 drop into both eyes at bedtime.     ? leuprolide, 6 Month, (ELIGARD) 45 MG injection Inject 45 mg into the skin every 6 (six) months.    ? levothyroxine (SYNTHROID) 175 MCG tablet Take 175 mcg by mouth See admin instructions. 143mg on Saturdays and Sundays    ? levothyroxine (SYNTHROID) 200 MCG tablet Take 200 mcg by mouth. 2061m Monday through Friday (Patient not taking: Reported on 08/11/2021)    ?  lisinopril (ZESTRIL) 40 MG tablet Take 1 tablet (40 mg total) by mouth daily. 90 tablet 3  ? metFORMIN (GLUCOPHAGE-XR) 500 MG 24 hr tablet Take 1 tablet by mouth at bedtime.    ? metoprolol succinate (TOPROL-XL) 100 MG 24 hr tablet Take 1 tablet (100 mg total) by mouth daily. Take with or immediately following a meal. 90 tablet 3  ? MYRBETRIQ 50 MG TB24 tablet Take 50 mg by mouth daily.    ? nitroGLYCERIN (NITROSTAT) 0.4 MG SL tablet Place 1 tablet (0.4 mg total) under the tongue every 5 (five) minutes as needed for chest pain. 25 tablet 4  ? pantoprazole (PROTONIX) 40 MG tablet TAKE 2  TABLETS BY MOUTH EVERY DAY 180 tablet 3  ? rosuvastatin (CRESTOR) 40 MG tablet Take 1 tablet (40 mg total) by mouth daily. 90 tablet 3  ? ?No current facility-administered medications for this visit.  ? ? ?Medication Side Effects: None ? ?Allergies:  ?Allergies  ?Allergen Reactions  ? Cimetidine Other (See Comments)  ?  Unknown - over 30 years ago (2021) - unsure what occurred it may have been hives or flushing ?Other reaction(s): flushed  ? Atorvastatin   ?  Myalgias on '80mg'$  dose  ? Niacin   ?  Other reaction(s): intolerant  ? Niacin And Related Other (See Comments)  ?  Flushing   ? ? ?Past Medical History:  ?Diagnosis Date  ? Allergic rhinitis   ? Aortic atherosclerosis ([place])   ? Arthritis   ? "a little bit in some of my joints" (06/20/2014)  ? Ascending aorta dilatation (HCC)   ? 57m by chest CTA 05/2020 and 42 mm by chest MRI MRA 05/2021  ? Chronic diastolic CHF (congestive heart failure) ([place]   ? Coronary artery disease   ? a. 05/2014 Cath/PCI: LM nl, LAD 40p, 60-70d (FFR 0.80->3.0x24 Synergy DES), D2 40, LCX 90d (2.25x12 Synergy DES), RCA 20-362mEF 60-65%. b. Cath 02/2017 without acute change, elev LVEDP suggestive of dCHF.  ? Depression   ? Dilated aortic root ([facility]  ? aortic root at 4431my Chest CTA 11/2019  ? ED (erectile dysfunction)   ? Environmental allergies   ? dry cough  ? GERD (gastroesophageal reflux disease)   ? History of hiatal hernia   ? Hydronephrosis of right kidney   ? a. s/p ureteral stenting in the past.  ? Hyperlipidemia   ? under control  ? Hypertension   ? Hypothyroidism   ? OCD (obsessive compulsive disorder)   ? Pacemaker   ? PAF (paroxysmal atrial fibrillation) ([facility] ? Panic disorder   ? PAT (paroxysmal atrial tachycardia) ([facility] ? PONV (postoperative nausea and vomiting)   ? after thyroid surgery no problems in 10 years   ? [place] ? prostate cancer- robotic prostatectomy - followed by radiation because of positive lymph node--and pt on lupron injections  ? PVC's  (premature ventricular contractions)   ? Sinus bradycardia   ? a. chronic, asymptomatic.  24 hour Holter 10/2016 showed Sinus bradycardia, sinus arrhythmia and normal sinus rhythm with average heart rate 50bpm and heart rate ranged from 32 to 100bpm.  ? SUI (stress urinary incontinence), male   ? S/P ROBOTIC PROSTATECTOMY AND RADIATION TX  ? ? ?Past Medical History, Surgical history, Social history, and Family history were reviewed and updated as appropriate.  ? ?Please see review of systems for further details on the patient's review from today.  ? ?Objective:  ? ?  Physical Exam:  ?There were no vitals taken for this visit. ? ?Physical Exam ?Constitutional:   ?   General: He is not in acute distress. ?Musculoskeletal:     ?   General: No deformity.  ?Neurological:  ?   Mental Status: He is alert and oriented to person, place, and time.  ?   Coordination: Coordination normal.  ?Psychiatric:     ?   Attention and Perception: Attention and perception normal. He does not perceive auditory or visual hallucinations.     ?   Mood and Affect: Mood normal. Mood is not anxious or depressed. Affect is not labile, blunt, angry or inappropriate.     ?   Speech: Speech normal.     ?   Behavior: Behavior normal.     ?   Thought Content: Thought content normal. Thought content is not paranoid or delusional. Thought content does not include homicidal or suicidal ideation. Thought content does not include homicidal or suicidal plan.     ?   Cognition and Memory: Cognition and memory normal.     ?   Judgment: Judgment normal.  ?   Comments: Insight intact  ? ? ?Lab Review:  ?   ?Component Value Date/Time  ? NA 143 08/11/2021 0954  ? K 4.3 08/11/2021 0954  ? CL 107 (H) 08/11/2021 0954  ? CO2 22 08/11/2021 0954  ? GLUCOSE 92 08/11/2021 0954  ? GLUCOSE 98 10/31/2018 1247  ? BUN 23 08/11/2021 0954  ? CREATININE 1.03 08/11/2021 0954  ? CREATININE 0.92 10/31/2018 1247  ? CREATININE 1.07 10/14/2015 0850  ? CALCIUM 9.6 08/11/2021 0954  ? PROT  6.9 08/28/2020 0815  ? ALBUMIN 4.6 08/28/2020 0815  ? AST 22 08/28/2020 0815  ? AST 19 10/31/2018 1247  ? ALT 23 08/28/2020 0815  ? ALT 25 10/31/2018 1247  ? ALKPHOS 49 08/28/2020 0815  ? BILITOT 0.2 03/31/202

## 2021-09-03 ENCOUNTER — Encounter: Payer: Self-pay | Admitting: Internal Medicine

## 2021-09-03 ENCOUNTER — Ambulatory Visit: Payer: Medicare PPO | Admitting: Internal Medicine

## 2021-09-03 DIAGNOSIS — I495 Sick sinus syndrome: Secondary | ICD-10-CM | POA: Diagnosis not present

## 2021-09-03 LAB — CUP PACEART INCLINIC DEVICE CHECK
Date Time Interrogation Session: 20230406110413
Implantable Lead Implant Date: 20201221
Implantable Lead Implant Date: 20201221
Implantable Lead Location: 753859
Implantable Lead Location: 753860
Implantable Lead Model: 377169
Implantable Lead Model: 377171
Implantable Lead Serial Number: 7000011949
Implantable Lead Serial Number: 81083606
Implantable Pulse Generator Implant Date: 20201221
Pulse Gen Model: 407145
Pulse Gen Serial Number: 69703841

## 2021-09-03 NOTE — Progress Notes (Signed)
? ? ? ? ?HPI ?[name] returns today for followup. He is a pleasant 71 yo man with a h/o sinus node dysfunction, s/p PPM insertion. He underwent PPM insertion one year ago. He notes that he occaisionally feels like his heart beats hard. No clear provoking factors. He denies chest pain or sob. No edema.  ?Allergies  ?Allergen Reactions  ? Cimetidine Other (See Comments)  ?  Unknown - over 30 years ago (2021) - unsure what occurred it may have been hives or flushing ?Other reaction(s): flushed  ? Atorvastatin   ?  Myalgias on '80mg'$  dose  ? Niacin   ?  Other reaction(s): intolerant  ? Niacin And Related Other (See Comments)  ?  Flushing   ? ? ? ?Current Outpatient Medications  ?Medication Sig Dispense Refill  ? acetaminophen (TYLENOL) 500 MG tablet Take 1,000 mg by mouth every 6 (six) hours as needed (pain).    ? albuterol (VENTOLIN HFA) 108 (90 Base) MCG/ACT inhaler Inhale 1-2 puffs into the lungs every 6 (six) hours as needed for wheezing or shortness of breath. 18 g 0  ? ALPRAZolam (XANAX) 0.25 MG tablet Take 1 tablet (0.25 mg total) by mouth 2 (two) times daily as needed for anxiety. 60 tablet 2  ? amLODipine (NORVASC) 10 MG tablet Take 1 tablet (10 mg total) by mouth daily. 90 tablet 3  ? apixaban (ELIQUIS) 5 MG TABS tablet Take 1 tablet (5 mg total) by mouth 2 (two) times daily. 180 tablet 3  ? brexpiprazole (REXULTI) 1 MG TABS tablet Take 1 tablet (1 mg total) by mouth daily. 90 tablet 3  ? fenofibrate (TRICOR) 145 MG tablet Take 1 tablet (145 mg total) by mouth daily. 90 tablet 3  ? fexofenadine (ALLEGRA) 180 MG tablet Take 180 mg by mouth daily.    ? fluvoxaMINE (LUVOX) 100 MG tablet Take 1 tablet (100 mg total) by mouth 2 (two) times daily. 180 tablet 3  ? furosemide (LASIX) 20 MG tablet TAKE 1 TABLET BY MOUTH  DAILY AS NEEDED FOR FLUID OR EDEMA 90 tablet 0  ? latanoprost (XALATAN) 0.005 % ophthalmic solution Place 1 drop into both eyes at bedtime.     ? leuprolide, 6 Month, (ELIGARD) 45 MG injection  Inject 45 mg into the skin every 6 (six) months.    ? levothyroxine (SYNTHROID) 175 MCG tablet Take 175 mcg by mouth See admin instructions. 145mg on Saturdays and Sundays    ? levothyroxine (SYNTHROID) 200 MCG tablet Take 200 mcg by mouth. 2080m Monday through Friday    ? lisinopril (ZESTRIL) 40 MG tablet Take 1 tablet (40 mg total) by mouth daily. 90 tablet 3  ? metFORMIN (GLUCOPHAGE-XR) 500 MG 24 hr tablet Take 1 tablet by mouth at bedtime.    ? metoprolol succinate (TOPROL-XL) 100 MG 24 hr tablet Take 1 tablet (100 mg total) by mouth daily. Take with or immediately following a meal. 90 tablet 3  ? MYRBETRIQ 50 MG TB24 tablet Take 50 mg by mouth daily.    ? nitroGLYCERIN (NITROSTAT) 0.4 MG SL tablet Place 1 tablet (0.4 mg total) under the tongue every 5 (five) minutes as needed for chest pain. 25 tablet 4  ? pantoprazole (PROTONIX) 40 MG tablet TAKE 2 TABLETS BY MOUTH EVERY DAY 180 tablet 3  ? rosuvastatin (CRESTOR) 40 MG tablet Take 1 tablet (40 mg total) by mouth daily. 90 tablet 3  ? ?No current facility-administered medications for this visit.  ? ? ? ?Past Medical History:  ?  Diagnosis Date  ? Allergic rhinitis   ? Aortic atherosclerosis ([place])   ? Arthritis   ? "a little bit in some of my joints" (06/20/2014)  ? Ascending aorta dilatation (HCC)   ? 23m by chest CTA 05/2020 and 42 mm by chest MRI MRA 05/2021  ? Chronic diastolic CHF (congestive heart failure) ([facility]   ? Coronary artery disease   ? a. 05/2014 Cath/PCI: LM nl, LAD 40p, 60-70d (FFR 0.80->3.0x24 Synergy DES), D2 40, LCX 90d (2.25x12 Synergy DES), RCA 20-349mEF 60-65%. b. Cath 02/2017 without acute change, elev LVEDP suggestive of dCHF.  ? Depression   ? Dilated aortic root ([facility]  ? aortic root at 4465my Chest CTA 11/2019  ? ED (erectile dysfunction)   ? Environmental allergies   ? dry cough  ? GERD (gastroesophageal reflux disease)   ? History of hiatal hernia   ? Hydronephrosis of right kidney   ? a. s/p ureteral stenting in the past.  ?  Hyperlipidemia   ? under control  ? Hypertension   ? Hypothyroidism   ? OCD (obsessive compulsive disorder)   ? Pacemaker   ? PAF (paroxysmal atrial fibrillation) ([facility] ? Panic disorder   ? PAT (paroxysmal atrial tachycardia) ([facility] ? PONV (postoperative nausea and vomiting)   ? after thyroid surgery no problems in 10 years   ? Prostate cancer ([facility] ? prostate cancer- robotic prostatectomy - followed by radiation because of positive lymph node--and pt on lupron injections  ? PVC's (premature ventricular contractions)   ? Sinus bradycardia   ? a. chronic, asymptomatic.  24 hour Holter 10/2016 showed Sinus bradycardia, sinus arrhythmia and normal sinus rhythm with average heart rate 50bpm and heart rate ranged from 32 to 100bpm.  ? SUI (stress urinary incontinence), male   ? S/P ROBOTIC PROSTATECTOMY AND RADIATION TX  ? ? ?ROS: ? ? All systems reviewed and negative except as noted in the HPI. ? ? ?Past Surgical History:  ?Procedure Laterality Date  ? COLONOSCOPY  07/2013  ? Dr. EdwOletta Lamas CORONARY ANGIOPLASTY WITH STENT PLACEMENT  06/20/2014  ? "2"  ? CYSTOSCOPY  11/30/2011  ? Procedure: CYSTOSCOPY;  Surgeon: ScoReece PackerD;  Location: [facility];  Service: Urology;  Laterality: N/A;  Inplantation of Artificial Sphincter and Cystoscopy  ? CYSTOSCOPY WITH RETROGRADE PYELOGRAM, URETEROSCOPY AND STENT PLACEMENT Right 02/11/2014  ? Procedure: CYSTOSCOPY WITH RETROGRADE PYELOGRAM, URETEROSCOPY ,URETERAL BIOPSY AND STENT PLACEMENT;  Surgeon: LesRaynelle BringD;  Location: [facility];  Service: Urology;  Laterality: Right;  ? CYSTOSCOPY WITH RETROGRADE PYELOGRAM, URETEROSCOPY AND STENT PLACEMENT Right 04/15/2014  ? Procedure: CYSTOSCOPY WITH RETROGRADE PYELOGRAM, URETEROSCOPY, BIOPSY AND STENT PLACEMENT;  Surgeon: LesRaynelle BringD;  Location: [facility];  Service: Urology;  Laterality: Right;  ? INTRAVASCULAR PRESSURE WIRE/FFR STUDY N/A 07/08/2020  ? Procedure: INTRAVASCULAR PRESSURE WIRE/FFR STUDY;  Surgeon: SmiBelva CromeD;   Location: [facility];  Service: Cardiovascular;  Laterality: N/A;  ? LEFT HEART CATH AND CORONARY ANGIOGRAPHY N/A 03/29/2017  ? Procedure: LEFT HEART CATH AND CORONARY ANGIOGRAPHY;  Surgeon: HarLeonie ManD;  Location: [facility];  Service: Cardiovascular;  Laterality: N/A;  ? LEFT HEART CATHETERIZATION WITH CORONARY ANGIOGRAM N/A 06/20/2014  ? Procedure: LEFT HEART CATHETERIZATION WITH CORONARY ANGIOGRAM;  Surgeon: DavLeonie ManD;  [facility];  Service: Cardiovascular;  Laterality: N/A;  ? PACEMAKER IMPLANT N/A 05/21/2019  ? Procedure: PACEMAKER IMPLANT;  Surgeon: TayLovena Le  Champ Mungo, MD;  Location: [facility];  Service: Cardiovascular;  Laterality: N/A;  ? PERCUTANEOUS CORONARY STENT INTERVENTION (PCI-S)  06/20/2014  ? Procedure: PERCUTANEOUS CORONARY STENT INTERVENTION (PCI-S);  Surgeon: Leonie Man, MD;  Location: [facility];  Service: Cardiovascular;;  LAD and Circumflex  ? REFRACTIVE SURGERY Bilateral 2004  ? RIGHT/LEFT HEART CATH AND CORONARY ANGIOGRAPHY N/A 07/08/2020  ? Procedure: RIGHT/LEFT HEART CATH AND CORONARY ANGIOGRAPHY;  Surgeon: Belva Crome, MD;  Location: [facility];  Service: Cardiovascular;  Laterality: N/A;  ? ROBOT ASSISTED LAPAROSCOPIC RADICAL PROSTATECTOMY  05/2010  ? THYROIDECTOMY, PARTIAL  10/1974  ? TONSILLECTOMY  1956   ? TOTAL THYROIDECTOMY  10/2012  ? "nuked it"  ? UPPER GI ENDOSCOPY    ? food impaction 2009 done by Dr Oletta Lamas  ? URINARY SPHINCTER IMPLANT  11/30/2011  ? Procedure: ARTIFICIAL URINARY SPHINCTER;  Surgeon: Reece Packer, MD;  Location: [facility];  Service: Urology;  Laterality: N/A;  ? ? ? ?Family History  ?Problem Relation Age of Onset  ? Cancer Father   ? Hyperlipidemia Father   ? Heart disease Father   ?     Father had CABG/aorta repair by the time he was in his 68s  ? CAD Brother   ?     Brother who is 5 years younger has had some sort of stenting  ? ? ? ?Social History  ? ?Socioeconomic History  ? Marital status:  Married  ?  Spouse name: Not on file  ? Number of children: Not on file  ? Years of education: Not on file  ? Highest education level: Not on file  ?Occupational History  ? Not on file  ?Tobacco Use  ? Smoking status

## 2021-09-03 NOTE — Patient Instructions (Signed)
Medication Instructions:  ?Your physician recommends that you continue on your current medications as directed. Please refer to the Current Medication list given to you today. ? ?Labwork: ?None ordered. ? ?Testing/Procedures: ?None ordered. ? ?Follow-Up: ?Your physician wants you to follow-up in: one year with Cristopher Peru, MD or one of the following Advanced Practice Providers on your designated Care Team:   ?Tommye Standard, PA-C ?Legrand Como "Jonni Sanger" [place], PA-C ?You will receive a reminder letter in the mail two months in advance. If you don't receive a letter, please call our office to schedule the follow-up appointment. ? ?Remote monitoring is used to monitor your Pacemaker from home. This monitoring reduces the number of office visits required to check your device to one time per year. It allows [place] to keep an eye on the functioning of your device to ensure it is working properly. You are scheduled for a device check from home on 11/16/2021. You may send your transmission at any time that day. If you have a wireless device, the transmission will be sent automatically. After your physician reviews your transmission, you will receive a postcard with your next transmission date. ? ?Any Other Special Instructions Will Be Listed Below (If Applicable). ? ?If you need a refill on your cardiac medications before your next appointment, please call your pharmacy.  ? ? ? ? ?

## 2021-09-23 DIAGNOSIS — C61 Malignant neoplasm of prostate: Secondary | ICD-10-CM | POA: Diagnosis not present

## 2021-09-25 DIAGNOSIS — I2510 Atherosclerotic heart disease of native coronary artery without angina pectoris: Secondary | ICD-10-CM | POA: Diagnosis not present

## 2021-09-25 DIAGNOSIS — E890 Postprocedural hypothyroidism: Secondary | ICD-10-CM | POA: Diagnosis not present

## 2021-09-25 DIAGNOSIS — I10 Essential (primary) hypertension: Secondary | ICD-10-CM | POA: Diagnosis not present

## 2021-09-25 DIAGNOSIS — R7309 Other abnormal glucose: Secondary | ICD-10-CM | POA: Diagnosis not present

## 2021-09-25 DIAGNOSIS — E669 Obesity, unspecified: Secondary | ICD-10-CM | POA: Diagnosis not present

## 2021-09-30 DIAGNOSIS — N3946 Mixed incontinence: Secondary | ICD-10-CM | POA: Diagnosis not present

## 2021-09-30 DIAGNOSIS — C61 Malignant neoplasm of prostate: Secondary | ICD-10-CM | POA: Diagnosis not present

## 2021-11-16 ENCOUNTER — Ambulatory Visit (INDEPENDENT_AMBULATORY_CARE_PROVIDER_SITE_OTHER): Payer: Medicare PPO

## 2021-11-16 DIAGNOSIS — I442 Atrioventricular block, complete: Secondary | ICD-10-CM | POA: Diagnosis not present

## 2021-11-19 LAB — CUP PACEART REMOTE DEVICE CHECK
Date Time Interrogation Session: 20230620094903
Implantable Lead Implant Date: 20201221
Implantable Lead Implant Date: 20201221
Implantable Lead Location: 753859
Implantable Lead Location: 753860
Implantable Lead Model: 377169
Implantable Lead Model: 377171
Implantable Lead Serial Number: 7000011949
Implantable Lead Serial Number: 81083606
Implantable Pulse Generator Implant Date: 20201221
Pulse Gen Model: 407145
Pulse Gen Serial Number: 69703841

## 2021-11-25 DIAGNOSIS — E890 Postprocedural hypothyroidism: Secondary | ICD-10-CM | POA: Diagnosis not present

## 2021-12-07 DIAGNOSIS — H401131 Primary open-angle glaucoma, bilateral, mild stage: Secondary | ICD-10-CM | POA: Diagnosis not present

## 2021-12-07 DIAGNOSIS — H4010X1 Unspecified open-angle glaucoma, mild stage: Secondary | ICD-10-CM | POA: Diagnosis not present

## 2021-12-07 NOTE — Progress Notes (Signed)
Remote pacemaker transmission.   

## 2022-01-05 DIAGNOSIS — C61 Malignant neoplasm of prostate: Secondary | ICD-10-CM | POA: Diagnosis not present

## 2022-01-20 DIAGNOSIS — E890 Postprocedural hypothyroidism: Secondary | ICD-10-CM | POA: Diagnosis not present

## 2022-02-03 ENCOUNTER — Telehealth: Payer: Self-pay | Admitting: Cardiology

## 2022-02-03 NOTE — Telephone Encounter (Signed)
I would like to check a fasting lipid and LFT. If he is agreeable to come in prior to appointment that is great. Otherwise he can fast on the morning of the appointment with me and we can complete them after I see him.

## 2022-02-03 NOTE — Telephone Encounter (Signed)
Patient wants to know if he needs to have lab work prior to his appt on 09/18 with Christen Bame, NP. He states that he has not had lab work in over a year. Please advise.

## 2022-02-04 ENCOUNTER — Other Ambulatory Visit: Payer: Self-pay | Admitting: *Deleted

## 2022-02-04 DIAGNOSIS — E785 Hyperlipidemia, unspecified: Secondary | ICD-10-CM

## 2022-02-04 DIAGNOSIS — I2510 Atherosclerotic heart disease of native coronary artery without angina pectoris: Secondary | ICD-10-CM

## 2022-02-04 NOTE — Telephone Encounter (Signed)
Sent pt mychart message if pt would like to come in before appt or after for fasting labs.

## 2022-02-04 NOTE — Progress Notes (Signed)
Cardiology Office Note:    Date:  02/15/2022   ID:  Blake Carter, DOB June 17, 1950, MRN [id number]  PCP:  [facility] [place] Providers Cardiologist:  Fransico Him, MD Electrophysiologist:  Cristopher Peru, MD     Referring MD: [facility]   Chief Complaint: follow-up CAD  History of Present Illness:    Blake Carter is a pleasant 71 y.o. 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  Seen 01/2020 with episodic palpitations associated with SOB and chest discomfort. Wore cardiac monitor which showed NSR/ST average HR 67 bpm, nonsustained atrial tachycardia, intermittent V pacing, occasional PVCs, bigeminal PVCs, trigeminal PVCs and ventricular couplets and triplets, and wide-complex tachycardia up to 5 beats.  He was started on metoprolol.  Nuclear stress test was normal.  Pacemaker interrogation 01/26/2020 revealed episode of atrial fibrillation therefore he was started on anticoagulation.  Had itching with Eliquis and was changed to Xarelto.  Last cardiac cath was 07/2020 showing widely patent LCx and LAD stents, normal RCA and LM and diffuse nonobstructive disease of the PDA.  There was a 30% stenosis prior to the mid LAD stent and 25 to 30% in distal LCx.  FFR was normal with no evidence of microvascular disease and LVEDP was normal.  Medical management recommended.  Last seen by Dr. Radford Pax on 08/11/21 at which time he was doing well and no changes were made to treatment. He saw Dr. Lovena Le for EP follow-up on 09/03/21, normal device function, recommendation to follow-up in one year.  Today, he is here for 6 month general cardiology follow-up. Recent lipid panel reveals cholesterol well-controlled. Reports occasional  twinges of chest pain that are not persistent, do not worsen with exertion. He denies dyspnea, orthopnea, PND, edema, presyncope, syncope. Has a newly developed tremor in left face and left hand that he thinks coincides with timing of starting metformin. Has an appointment later this week to have this checked out. Denies palpitations, reports there has been no evidence of a fib on remote device checks. No bleedings concerns.   Past Medical History:  Diagnosis Date   Allergic rhinitis    Aortic atherosclerosis ([place])    Arthritis    "a little bit in some of my joints" (06/20/2014)   Ascending aorta dilatation (HCC)    24m by chest CTA 05/2020 and 42 mm by chest MRI MRA 05/2021   Chronic diastolic CHF (congestive heart failure) ([place]    Coronary artery disease    a. 05/2014 Cath/PCI: LM nl, LAD 40p, 60-70d (FFR 0.80->3.0x24 Synergy DES), D2 40, LCX 90d (2.25x12 Synergy DES), RCA 20-337mEF 60-65%. b. Cath 02/2017 without acute change, elev LVEDP suggestive of dCHF.   Depression    Dilated aortic root (HCC)    aortic root at 4441my Chest CTA 11/2019   ED (erectile dysfunction)    Environmental allergies    dry cough   GERD (gastroesophageal reflux disease)    History of hiatal hernia    Hydronephrosis of right kidney    a. s/p ureteral stenting in the past.   Hyperlipidemia    under control   Hypertension    Hypothyroidism    OCD (obsessive compulsive disorder)    Pacemaker    PAF (paroxysmal atrial fibrillation) (HCC)    Panic  disorder    PAT (paroxysmal atrial tachycardia) (HCC)    PONV (postoperative nausea and vomiting)    after thyroid surgery no problems in 10 years    Prostate cancer ([place])    prostate cancer- robotic prostatectomy - followed by radiation because of positive lymph node--and pt on lupron injections   PVC's (premature ventricular contractions)    Sinus bradycardia    a. chronic, asymptomatic.  24 hour Holter 10/2016 showed Sinus bradycardia, sinus arrhythmia and  normal sinus rhythm with average heart rate 50bpm and heart rate ranged from 32 to 100bpm.   SUI (stress urinary incontinence), male    S/P ROBOTIC PROSTATECTOMY AND RADIATION TX    Past Surgical History:  Procedure Laterality Date   COLONOSCOPY  07/2013   Dr. Oletta Lamas   CORONARY ANGIOPLASTY WITH STENT PLACEMENT  06/20/2014   "2"   CYSTOSCOPY  11/30/2011   Procedure: CYSTOSCOPY;  Surgeon: Reece Packer, MD;  Location: [facility];  Service: Urology;  Laterality: N/A;  Inplantation of Artificial Sphincter and Cystoscopy   CYSTOSCOPY WITH RETROGRADE PYELOGRAM, URETEROSCOPY AND STENT PLACEMENT Right 02/11/2014   Procedure: CYSTOSCOPY WITH RETROGRADE PYELOGRAM, URETEROSCOPY ,URETERAL BIOPSY AND STENT PLACEMENT;  Surgeon: Raynelle Bring, MD;  Location: [facility];  Service: Urology;  Laterality: Right;   CYSTOSCOPY WITH RETROGRADE PYELOGRAM, URETEROSCOPY AND STENT PLACEMENT Right 04/15/2014   Procedure: CYSTOSCOPY WITH RETROGRADE PYELOGRAM, URETEROSCOPY, BIOPSY AND STENT PLACEMENT;  Surgeon: Raynelle Bring, MD;  Location: [facility];  Service: Urology;  Laterality: Right;   INTRAVASCULAR PRESSURE WIRE/FFR STUDY N/A 07/08/2020   Procedure: INTRAVASCULAR PRESSURE WIRE/FFR STUDY;  Surgeon: Belva Crome, MD;  Location: [place] [facility];  Service: Cardiovascular;  Laterality: N/A;   LEFT HEART CATH AND CORONARY ANGIOGRAPHY N/A 03/29/2017   Procedure: LEFT HEART CATH AND CORONARY ANGIOGRAPHY;  Surgeon: Leonie Man, MD;  Location: [place] [facility];  Service: Cardiovascular;  Laterality: N/A;   LEFT HEART CATHETERIZATION WITH CORONARY ANGIOGRAM N/A 06/20/2014   Procedure: LEFT HEART CATHETERIZATION WITH CORONARY ANGIOGRAM;  Surgeon: Leonie Man, MD;  Location[facility] CATH [facility];  Service: Cardiovascular;  Laterality: N/A;   PACEMAKER IMPLANT N/A 05/21/2019   Procedure: PACEMAKER IMPLANT;  Surgeon: Evans Lance, MD;  Location: [place] [facility];  Service: Cardiovascular;  Laterality: N/A;   PERCUTANEOUS  CORONARY STENT INTERVENTION (PCI-S)  06/20/2014   Procedure: PERCUTANEOUS CORONARY STENT INTERVENTION (PCI-S);  Surgeon: Leonie Man, MD;  Location: [facility] CATH LAB;  Service: Cardiovascular;;  LAD and Circumflex   REFRACTIVE SURGERY Bilateral 2004   RIGHT/LEFT HEART CATH AND CORONARY ANGIOGRAPHY N/A 07/08/2020   Procedure: RIGHT/LEFT HEART CATH AND CORONARY ANGIOGRAPHY;  Surgeon: Belva Crome, MD;  Location: [place] [facility];  Service: Cardiovascular;  Laterality: N/A;   ROBOT ASSISTED LAPAROSCOPIC RADICAL PROSTATECTOMY  05/2010   THYROIDECTOMY, PARTIAL  10/1974   TONSILLECTOMY  1956    TOTAL THYROIDECTOMY  10/2012   "nuked it"   UPPER GI ENDOSCOPY     food impaction 2009 done by Dr Oletta Lamas   URINARY SPHINCTER IMPLANT  11/30/2011   Procedure: ARTIFICIAL URINARY SPHINCTER;  Surgeon: Reece Packer, MD;  Location: [facility];  Service: Urology;  Laterality: N/A;    Current Medications: Current Meds  Medication Sig   acetaminophen (TYLENOL) 500 MG tablet Take 1,000 mg by mouth every 6 (six) hours as needed (pain).   ALPRAZolam (XANAX) 0.25 MG tablet Take 1 tablet (0.25 mg total) by mouth 2 (two) times daily as needed for anxiety.  brexpiprazole (REXULTI) 1 MG TABS tablet Take 1 tablet (1 mg total) by mouth daily.   fluvoxaMINE (LUVOX) 100 MG tablet Take 1 tablet (100 mg total) by mouth 2 (two) times daily.   latanoprost (XALATAN) 0.005 % ophthalmic solution Place 1 drop into both eyes at bedtime.    leuprolide, 6 Month, (ELIGARD) 45 MG injection Inject 45 mg into the skin every 6 (six) months.   levothyroxine (SYNTHROID) 137 MCG tablet Take 137 mcg by mouth every morning.   metFORMIN (GLUCOPHAGE-XR) 500 MG 24 hr tablet Take 1 tablet by mouth at bedtime.   MYRBETRIQ 50 MG TB24 tablet Take 50 mg by mouth daily.   nitroGLYCERIN (NITROSTAT) 0.4 MG SL tablet Place 1 tablet (0.4 mg total) under the tongue every 5 (five) minutes as needed for chest pain.   [DISCONTINUED] amLODipine (NORVASC) 10 MG  tablet Take 1 tablet (10 mg total) by mouth daily.   [DISCONTINUED] apixaban (ELIQUIS) 5 MG TABS tablet Take 1 tablet (5 mg total) by mouth 2 (two) times daily.   [DISCONTINUED] fenofibrate (TRICOR) 145 MG tablet Take 1 tablet (145 mg total) by mouth daily.   [DISCONTINUED] furosemide (LASIX) 20 MG tablet TAKE 1 TABLET BY MOUTH  DAILY AS NEEDED FOR FLUID OR EDEMA   [DISCONTINUED] lisinopril (ZESTRIL) 40 MG tablet TAKE 1 TABLET BY MOUTH EVERY DAY   [DISCONTINUED] metoprolol succinate (TOPROL-XL) 100 MG 24 hr tablet TAKE 1 TABLET BY MOUTH DAILY. TAKE WITH OR IMMEDIATELY FOLLOWING A MEAL.   [DISCONTINUED] pantoprazole (PROTONIX) 40 MG tablet TAKE 2 TABLETS BY MOUTH EVERY DAY   [DISCONTINUED] rosuvastatin (CRESTOR) 40 MG tablet Take 1 tablet (40 mg total) by mouth daily.     Allergies:   Cimetidine, Atorvastatin, and Niacin and related   Social History   Socioeconomic History   Marital status: Married    Spouse name: Not on file   Number of children: Not on file   Years of education: Not on file   Highest education level: Not on file  Occupational History   Not on file  Tobacco Use   Smoking status: Never   Smokeless tobacco: Never  Vaping Use   Vaping Use: Never used  Substance and Sexual Activity   Alcohol use: Yes    Alcohol/week: 0.0 standard drinks of alcohol    Comment: 06/20/2014 "1-2 drinks a couple times/month"   Drug use: No   Sexual activity: Yes  Other Topics Concern   Not on file  Social History Narrative   Not on file   Social Determinants of Health   Financial Resource Strain: Not on file  Food Insecurity: Not on file  Transportation Needs: Not on file  Physical Activity: Not on file  Stress: Not on file  Social Connections: Not on file     Family History: The patient's family history includes CAD in his brother; Cancer in his father; Heart disease in his father; Hyperlipidemia in his father.  ROS:   Please see the history of present illness.    +  tremor All other systems reviewed and are negative.  Labs/Other Studies Reviewed:    The following studies were reviewed today:  [facility] 07/08/20  Widely patent LAD and circumflex stents. Widely patent left main Widely patent circumflex. The left anterior descending contains 30% stenosis proximal to the mid stent.  Hemodynamic assessment was performed with the Pressure Wire X demonstrating an RFR of 0.9 and an FFR of 0.82.  A full hemodynamic assessment to exclude microvascular dysfunction was performed.  GFR was 3.6 and INR was 12.0. Right coronary is widely patent with luminal irregularities.  There is diffuse disease in the PDA but without focal high-grade obstruction. Left ventricular size is normal.  EF is 55%.  EDP is normal. Right heart pressures are normal.   RECOMMENDATIONS:   No evidence of significant epicardial disease or microvascular dysfunction. Continue aggressive risk factor modification. Initiate an exercise program and weight loss. Further management per treatment team.   Cardiac monitor 02/26/20  Normal sinus rhythm and sinus tachycardia. The average heart rate was 67bpm. and ranged from 60-123bpm. Nonsustained atrial tachycardia up to 16 beats in a row Intermittent Ventricular pacing Occsaional PVCs, bigeminal PVCs, trigeminal PVCs and ventricular couplets and triplets. Wide complex tachycardia up to 5 beats.  Echo 12/11/19   1. Left ventricular ejection fraction, by estimation, is 60 to 65%. The  left ventricle has normal function. The left ventricle has no regional  wall motion abnormalities. Left ventricular diastolic parameters are  consistent with Grade I diastolic  dysfunction (impaired relaxation).   2. Right ventricular systolic function is normal. The right ventricular  size is normal. There is normal pulmonary artery systolic pressure.   3. The mitral valve is normal in structure. No evidence of mitral valve  regurgitation. No evidence of mitral  stenosis.   4. The aortic valve is normal in structure. Aortic valve regurgitation is  mild. No aortic stenosis is present.   5. Aortic dilatation noted. There is moderate dilatation of the ascending  aorta measuring 46 mm.   6. The inferior vena cava is normal in size with greater than 50%  respiratory variability, suggesting right atrial pressure of 3 mmHg.   Comparison(s): Prior ECHO 42 mm ascending aorta. Current 45 mm. Consider  CTA of aorta for comparison.   Recent Labs: 08/11/2021: BUN 23; Creatinine, Ser 1.03; Hemoglobin 11.5; Platelets 243; Potassium 4.3; Sodium 143 02/09/2022: ALT 19  Recent Lipid Panel    Component Value Date/Time   CHOL 120 02/09/2022 1001   TRIG 146 02/09/2022 1001   HDL 39 (L) 02/09/2022 1001   CHOLHDL 3.1 02/09/2022 1001   CHOLHDL 3.8 03/29/2017 0554   VLDL 34 03/29/2017 0554   LDLCALC 56 02/09/2022 1001     Risk Assessment/Calculations:    CHA2DS2-VASc Score = 3  This indicates a 3.2% annual risk of stroke. The patient's score is based upon: CHF History: 0 HTN History: 1 Diabetes History: 1 Stroke History: 0 Vascular Disease History: 0 Age Score: 1 Gender Score: 0    Physical Exam:    VS:  BP 114/68   Pulse 69   Ht '5\' 11"'$  (1.803 m)   Wt 258 lb (117 kg)   SpO2 96%   BMI 35.98 kg/m     Wt Readings from Last 3 Encounters:  02/15/22 258 lb (117 kg)  09/03/21 251 lb (113.9 kg)  08/11/21 258 lb 3.2 oz (117.1 kg)     GEN: Well developed, obese male in no acute distress HEENT: Normal NECK: No JVD; No carotid bruits CARDIAC: RRR, no murmurs, rubs, gallops RESPIRATORY:  Clear to auscultation without rales, wheezing or rhonchi  ABDOMEN: Soft, non-tender, non-distended MUSCULOSKELETAL:  No edema; No deformity. 2+ pedal pulses, equal bilaterally SKIN: Warm and dry NEUROLOGIC:  Alert and oriented x 3 PSYCHIATRIC:  Normal affect   EKG:  EKG is not ordered today.    Diagnoses:    1. Aneurysm of ascending aorta without rupture  ([place])   2. Coronary artery disease involving  native coronary artery of native heart without angina pectoris   3. Hyperlipidemia LDL goal <70   4. PAF (paroxysmal atrial fibrillation) ([place])   5. Essential hypertension   6. Pacemaker   7. Chronic diastolic heart failure ([place])   8. Sinus node dysfunction (HCC)   9. Secondary hypercoagulable state ([place])    Assessment and Plan:     Ascending aortic dilatation: MRA chest 06/08/21 revealed unchanged size at 4.2 cm. Recommendation to repeat imaging in 1 year. No symptoms of chest or back pain. We will plan for MRA January 2024.   CAD without angina: PCI/DES to LAD and Cx 2016. Last cardiac cath 07/08/20 revealed patent stents, LAD with 30% stenosis proximal to mid stent. No evidence of microvascular dysfunction, normal LVEF, normal right heart pressures. Continue medical management.  Encouraged  PAF on chronic anticoagulation: He reports Dr. Lovena Le has seen no further evidence of A-fib on recent device checks.  He denies palpitations, dyspnea, chest discomfort.  No bleeding problems on Eliquis.  Current dose of Eliquis 5 mg twice daily is appropriate. Continue metoprolol, Eliquis.   Chronic HFpEF: LVEF 60-65%, G1DD on echo 11/2019.  Right heart pressures normal on cath 07/2020. He denies dyspnea, orthopnea, PND, edema. No clinical evidence of volume overload, although volume status is difficult to assess due to body habitus.  Continue Lasix, lisinopril, metoprolol.  Sinus node dysfunction s/p PPM Implant: Stable device function on most recent remote device check 11/19/21. Continue consistent follow-up.  Management per EP.   Hyperlipidemia LDL goal < 70: LDL 56 on 02/09/22.  Continue rosuvastatin.  Hypertension: BP is well-controlled. He does not monitor on a consistent basis. No medication changes today.      Disposition: 6 months with Dr. Lovena Le EP/9 months with Dr. Radford Pax   Medication Adjustments/Labs and Tests Ordered: Current medicines are  reviewed at length with the patient today.  Concerns regarding medicines are outlined above.  Orders Placed This Encounter  Procedures   MR ANGIO CHEST W WO CONTRAST   Meds ordered this encounter  Medications   amLODipine (NORVASC) 10 MG tablet    Sig: Take 1 tablet (10 mg total) by mouth daily.    Dispense:  90 tablet    Refill:  3   apixaban (ELIQUIS) 5 MG TABS tablet    Sig: Take 1 tablet (5 mg total) by mouth 2 (two) times daily.    Dispense:  180 tablet    Refill:  3    Stop xarelto   fenofibrate (TRICOR) 145 MG tablet    Sig: Take 1 tablet (145 mg total) by mouth daily.    Dispense:  90 tablet    Refill:  3   furosemide (LASIX) 20 MG tablet    Sig: TAKE 1 TABLET BY MOUTH  DAILY AS NEEDED FOR FLUID OR EDEMA    Dispense:  90 tablet    Refill:  3    Requesting 1 year supply   lisinopril (ZESTRIL) 40 MG tablet    Sig: Take 1 tablet (40 mg total) by mouth daily.    Dispense:  90 tablet    Refill:  3   metoprolol succinate (TOPROL-XL) 100 MG 24 hr tablet    Sig: Take 1 tablet (100 mg total) by mouth daily. TAKE WITH OR IMMEDIATELY FOLLOWING A MEAL.    Dispense:  90 tablet    Refill:  3   pantoprazole (PROTONIX) 40 MG tablet    Sig: Take 2 tablets (80 mg total) by mouth daily.  Dispense:  180 tablet    Refill:  3   rosuvastatin (CRESTOR) 40 MG tablet    Sig: Take 1 tablet (40 mg total) by mouth daily.    Dispense:  90 tablet    Refill:  3    Patient Instructions  Medication Instructions:   Your physician recommends that you continue on your current medications as directed. Please refer to the Current Medication list given to you today.   *If you need a refill on your cardiac medications before your next appointment, please call your pharmacy*   Lab Work:  None ordered.  If you have labs (blood work) drawn today and your tests are completely normal, you will receive your results only by: [place] (if you have MyChart) OR A paper copy in the mail If  you have any lab test that is abnormal or we need to change your treatment, we will call you to review the results.   Testing/Procedures:  MRA of chest in January.    Follow-Up: At [facility], you and your health needs are our priority.  As part of our continuing mission to provide you with exceptional heart care, we have created designated Provider Care Teams.  These Care Teams include your primary Cardiologist (physician) and Advanced Practice Providers (APPs -  Physician Assistants and Nurse Practitioners) who all work together to provide you with the care you need, when you need it.  We recommend signing up for the patient portal called "MyChart".  Sign up information is provided on this After Visit Summary.  MyChart is used to connect with patients for Virtual Visits (Telemedicine).  Patients are able to view lab/test results, encounter notes, upcoming appointments, etc.  Non-urgent messages can be sent to your provider as well.   To learn more about what you can do with MyChart, go to [email or web address].    Your next appointment:   9 month(s)  The format for your next appointment:   In Person  Provider:   Fransico Him, MD     Other Instructions  Your physician wants you to follow-up in: 9 months with Dr.Turner. You will receive a reminder letter in the mail two months in advance. If you don't receive a letter, please call our office to schedule the follow-up appointment.  Adopting a Healthy Lifestyle.   Weight: Know what a healthy weight is for you (roughly BMI <25) and aim to maintain this. You can calculate your body mass index on your smart phone  Diet: Aim for 7+ servings of fruits and vegetables daily Limit animal fats in diet for cholesterol and heart health - choose grass fed whenever available Avoid highly processed foods (fast food burgers, tacos, fried chicken, pizza, hot dogs, french fries)  Saturated fat comes in the form of butter, lard, coconut  oil, margarine, partially hydrogenated oils, and fat in meat. These increase your risk of cardiovascular disease.  Use healthy plant oils, such as olive, canola, soy, corn, sunflower and peanut.  Whole foods such as fruits, vegetables and whole grains have fiber  Men need > 38 grams of fiber per day Women need > 25 grams of fiber per day  Load up on vegetables and fruits - one-half of your plate: Aim for color and variety, and remember that potatoes dont count. Go for whole grains - one-quarter of your plate: Whole wheat, barley, wheat berries, quinoa, oats, brown rice, and foods made with them. If you want pasta, go with whole wheat pasta. Protein power -  one-quarter of your plate: Fish, chicken, beans, and nuts are all healthy, versatile protein sources. Limit red meat. You need carbohydrates for energy! The type of carbohydrate is more important than the amount. Choose carbohydrates such as vegetables, fruits, whole grains, beans, and nuts in the place of white rice, white pasta, potatoes (baked or fried), macaroni and cheese, cakes, cookies, and donuts.  If youre thirsty, drink water. Coffee and tea are good in moderation, but skip sugary drinks and limit milk and dairy products to one or two daily servings. Keep sugar intake at 6 teaspoons or 24 grams or LESS       Exercise: Aim for 150 min of moderate intensity exercise weekly for heart health, and weights twice weekly for bone health Stay active - any steps are better than no steps! Aim for 7-9 hours of sleep daily        Important Information About Sugar         Signed, Emmaline Life, NP  02/15/2022 11:52 AM    [place]

## 2022-02-07 ENCOUNTER — Other Ambulatory Visit: Payer: Self-pay | Admitting: Cardiology

## 2022-02-07 DIAGNOSIS — I77819 Aortic ectasia, unspecified site: Secondary | ICD-10-CM

## 2022-02-07 DIAGNOSIS — I495 Sick sinus syndrome: Secondary | ICD-10-CM

## 2022-02-07 DIAGNOSIS — I10 Essential (primary) hypertension: Secondary | ICD-10-CM

## 2022-02-07 DIAGNOSIS — E7800 Pure hypercholesterolemia, unspecified: Secondary | ICD-10-CM

## 2022-02-07 DIAGNOSIS — I2510 Atherosclerotic heart disease of native coronary artery without angina pectoris: Secondary | ICD-10-CM

## 2022-02-07 DIAGNOSIS — I480 Paroxysmal atrial fibrillation: Secondary | ICD-10-CM

## 2022-02-07 DIAGNOSIS — I5032 Chronic diastolic (congestive) heart failure: Secondary | ICD-10-CM

## 2022-02-09 ENCOUNTER — Ambulatory Visit: Payer: Medicare PPO | Attending: Cardiology

## 2022-02-09 DIAGNOSIS — I2583 Coronary atherosclerosis due to lipid rich plaque: Secondary | ICD-10-CM | POA: Diagnosis not present

## 2022-02-09 DIAGNOSIS — I2510 Atherosclerotic heart disease of native coronary artery without angina pectoris: Secondary | ICD-10-CM

## 2022-02-09 DIAGNOSIS — E785 Hyperlipidemia, unspecified: Secondary | ICD-10-CM | POA: Diagnosis not present

## 2022-02-10 LAB — HEPATIC FUNCTION PANEL
ALT: 19 IU/L (ref 0–44)
AST: 20 IU/L (ref 0–40)
Albumin: 4.5 g/dL (ref 3.9–4.9)
Alkaline Phosphatase: 55 IU/L (ref 44–121)
Bilirubin Total: 0.2 mg/dL (ref 0.0–1.2)
Bilirubin, Direct: <0.1 mg/dL (ref 0.00–0.40)
Total Protein: 6.8 g/dL (ref 6.0–8.5)

## 2022-02-10 LAB — LIPID PANEL
Chol/HDL Ratio: 3.1 ratio (ref 0.0–5.0)
Cholesterol, Total: 120 mg/dL (ref 100–199)
HDL: 39 mg/dL — ABNORMAL LOW (ref >39)
LDL Chol Calc (NIH): 56 mg/dL (ref 0–99)
Triglycerides: 146 mg/dL (ref 0–149)
VLDL Cholesterol Cal: 25 mg/dL (ref 5–40)

## 2022-02-10 NOTE — Progress Notes (Signed)
Pt has been made aware of normal result and verbalized understanding.  jw

## 2022-02-11 ENCOUNTER — Other Ambulatory Visit: Payer: Self-pay | Admitting: Cardiology

## 2022-02-11 DIAGNOSIS — I77810 Thoracic aortic ectasia: Secondary | ICD-10-CM

## 2022-02-11 DIAGNOSIS — I5032 Chronic diastolic (congestive) heart failure: Secondary | ICD-10-CM

## 2022-02-11 DIAGNOSIS — I10 Essential (primary) hypertension: Secondary | ICD-10-CM

## 2022-02-11 DIAGNOSIS — I77819 Aortic ectasia, unspecified site: Secondary | ICD-10-CM

## 2022-02-11 DIAGNOSIS — I495 Sick sinus syndrome: Secondary | ICD-10-CM

## 2022-02-11 DIAGNOSIS — I2510 Atherosclerotic heart disease of native coronary artery without angina pectoris: Secondary | ICD-10-CM

## 2022-02-11 DIAGNOSIS — E7800 Pure hypercholesterolemia, unspecified: Secondary | ICD-10-CM

## 2022-02-11 DIAGNOSIS — I480 Paroxysmal atrial fibrillation: Secondary | ICD-10-CM

## 2022-02-15 ENCOUNTER — Ambulatory Visit: Payer: Medicare PPO | Attending: Cardiology | Admitting: Nurse Practitioner

## 2022-02-15 ENCOUNTER — Ambulatory Visit (INDEPENDENT_AMBULATORY_CARE_PROVIDER_SITE_OTHER): Payer: Medicare PPO

## 2022-02-15 ENCOUNTER — Encounter: Payer: Self-pay | Admitting: Nurse Practitioner

## 2022-02-15 VITALS — BP 114/68 | HR 69 | Ht 71.0 in | Wt 258.0 lb

## 2022-02-15 DIAGNOSIS — I442 Atrioventricular block, complete: Secondary | ICD-10-CM

## 2022-02-15 DIAGNOSIS — I7121 Aneurysm of the ascending aorta, without rupture: Secondary | ICD-10-CM | POA: Diagnosis not present

## 2022-02-15 DIAGNOSIS — I480 Paroxysmal atrial fibrillation: Secondary | ICD-10-CM | POA: Diagnosis not present

## 2022-02-15 DIAGNOSIS — I495 Sick sinus syndrome: Secondary | ICD-10-CM

## 2022-02-15 DIAGNOSIS — I2510 Atherosclerotic heart disease of native coronary artery without angina pectoris: Secondary | ICD-10-CM

## 2022-02-15 DIAGNOSIS — E785 Hyperlipidemia, unspecified: Secondary | ICD-10-CM

## 2022-02-15 DIAGNOSIS — I5032 Chronic diastolic (congestive) heart failure: Secondary | ICD-10-CM | POA: Diagnosis not present

## 2022-02-15 DIAGNOSIS — Z950 Presence of cardiac pacemaker: Secondary | ICD-10-CM

## 2022-02-15 DIAGNOSIS — D6869 Other thrombophilia: Secondary | ICD-10-CM | POA: Diagnosis not present

## 2022-02-15 DIAGNOSIS — I10 Essential (primary) hypertension: Secondary | ICD-10-CM | POA: Diagnosis not present

## 2022-02-15 MED ORDER — APIXABAN 5 MG PO TABS: 5.0000 mg | ORAL_TABLET | Freq: Two times a day (BID) | ORAL | 3 refills | Status: DC

## 2022-02-15 MED ORDER — PANTOPRAZOLE SODIUM 40 MG PO TBEC: 80.0000 mg | DELAYED_RELEASE_TABLET | Freq: Every day | ORAL | 3 refills | Status: DC

## 2022-02-15 MED ORDER — FENOFIBRATE 145 MG PO TABS: 145.0000 mg | ORAL_TABLET | Freq: Every day | ORAL | 3 refills | Status: DC

## 2022-02-15 MED ORDER — ROSUVASTATIN CALCIUM 40 MG PO TABS: 40.0000 mg | ORAL_TABLET | Freq: Every day | ORAL | 3 refills | Status: DC

## 2022-02-15 MED ORDER — AMLODIPINE BESYLATE 10 MG PO TABS: 10.0000 mg | ORAL_TABLET | Freq: Every day | ORAL | 3 refills | Status: DC

## 2022-02-15 MED ORDER — FUROSEMIDE 20 MG PO TABS: ORAL_TABLET | ORAL | 3 refills | Status: DC

## 2022-02-15 MED ORDER — LISINOPRIL 40 MG PO TABS: 40.0000 mg | ORAL_TABLET | Freq: Every day | ORAL | 3 refills | Status: DC

## 2022-02-15 MED ORDER — METOPROLOL SUCCINATE ER 100 MG PO TB24: 100.0000 mg | ORAL_TABLET | Freq: Every day | ORAL | 3 refills | Status: DC

## 2022-02-15 NOTE — Patient Instructions (Signed)
Medication Instructions:   Your physician recommends that you continue on your current medications as directed. Please refer to the Current Medication list given to you today.   *If you need a refill on your cardiac medications before your next appointment, please call your pharmacy*   Lab Work:  None ordered.  If you have labs (blood work) drawn today and your tests are completely normal, you will receive your results only by: [facility] (if you have MyChart) OR A paper copy in the mail If you have any lab test that is abnormal or we need to change your treatment, we will call you to review the results.   Testing/Procedures:  MRA of chest in January.    Follow-Up: At [facility], you and your health needs are our priority.  As part of our continuing mission to provide you with exceptional heart care, we have created designated Provider Care Teams.  These Care Teams include your primary Cardiologist (physician) and Advanced Practice Providers (APPs -  Physician Assistants and Nurse Practitioners) who all work together to provide you with the care you need, when you need it.  We recommend signing up for the patient portal called "MyChart".  Sign up information is provided on this After Visit Summary.  MyChart is used to connect with patients for Virtual Visits (Telemedicine).  Patients are able to view lab/test results, encounter notes, upcoming appointments, etc.  Non-urgent messages can be sent to your provider as well.   To learn more about what you can do with MyChart, go to [email or web address].    Your next appointment:   9 month(s)  The format for your next appointment:   In Person  Provider:   Fransico Him, MD     Other Instructions  Your physician wants you to follow-up in: 9 months with Dr.Turner. You will receive a reminder letter in the mail two months in advance. If you don't receive a letter, please call our office to schedule the follow-up  appointment.  Adopting a Healthy Lifestyle.   Weight: Know what a healthy weight is for you (roughly BMI <25) and aim to maintain this. You can calculate your body mass index on your smart phone  Diet: Aim for 7+ servings of fruits and vegetables daily Limit animal fats in diet for cholesterol and heart health - choose grass fed whenever available Avoid highly processed foods (fast food burgers, tacos, fried chicken, pizza, hot dogs, french fries)  Saturated fat comes in the form of butter, lard, coconut oil, margarine, partially hydrogenated oils, and fat in meat. These increase your risk of cardiovascular disease.  Use healthy plant oils, such as olive, canola, soy, corn, sunflower and peanut.  Whole foods such as fruits, vegetables and whole grains have fiber  Men need > 38 grams of fiber per day Women need > 25 grams of fiber per day  Load up on vegetables and fruits - one-half of your plate: Aim for color and variety, and remember that potatoes dont count. Go for whole grains - one-quarter of your plate: Whole wheat, barley, wheat berries, quinoa, oats, brown rice, and foods made with them. If you want pasta, go with whole wheat pasta. Protein power - one-quarter of your plate: Fish, chicken, beans, and nuts are all healthy, versatile protein sources. Limit red meat. You need carbohydrates for energy! The type of carbohydrate is more important than the amount. Choose carbohydrates such as vegetables, fruits, whole grains, beans, and nuts in the place of white rice,  white pasta, potatoes (baked or fried), macaroni and cheese, cakes, cookies, and donuts.  If youre thirsty, drink water. Coffee and tea are good in moderation, but skip sugary drinks and limit milk and dairy products to one or two daily servings. Keep sugar intake at 6 teaspoons or 24 grams or LESS       Exercise: Aim for 150 min of moderate intensity exercise weekly for heart health, and weights twice weekly for bone  health Stay active - any steps are better than no steps! Aim for 7-9 hours of sleep daily        Important Information About Sugar

## 2022-02-16 LAB — CUP PACEART REMOTE DEVICE CHECK
Date Time Interrogation Session: 20230918134754
Implantable Lead Implant Date: 20201221
Implantable Lead Implant Date: 20201221
Implantable Lead Location: 753859
Implantable Lead Location: 753860
Implantable Lead Model: 377169
Implantable Lead Model: 377171
Implantable Lead Serial Number: 7000011949
Implantable Lead Serial Number: 81083606
Implantable Pulse Generator Implant Date: 20201221
Pulse Gen Model: 407145
Pulse Gen Serial Number: 69703841

## 2022-03-02 NOTE — Progress Notes (Signed)
Remote pacemaker transmission.   

## 2022-03-04 ENCOUNTER — Ambulatory Visit: Payer: Medicare PPO | Admitting: Adult Health

## 2022-03-04 ENCOUNTER — Encounter: Payer: Self-pay | Admitting: Adult Health

## 2022-03-04 DIAGNOSIS — G4700 Insomnia, unspecified: Secondary | ICD-10-CM | POA: Diagnosis not present

## 2022-03-04 DIAGNOSIS — F411 Generalized anxiety disorder: Secondary | ICD-10-CM

## 2022-03-04 DIAGNOSIS — F331 Major depressive disorder, recurrent, moderate: Secondary | ICD-10-CM | POA: Diagnosis not present

## 2022-03-04 DIAGNOSIS — F429 Obsessive-compulsive disorder, unspecified: Secondary | ICD-10-CM

## 2022-03-04 MED ORDER — BREXPIPRAZOLE 0.5 MG PO TABS: 0.5000 mg | ORAL_TABLET | Freq: Every day | ORAL | 2 refills | Status: DC

## 2022-03-04 NOTE — Progress Notes (Signed)
Blake Carter [id number] [birth year] 71 y.o.  Subjective:   Patient ID:  Blake Carter is a 71 y.o. (DOB July 08, 1950) male.  Chief Complaint: No chief complaint on file.   HPI Blake Carter presents to the office today for follow-up of MDD, GAD and OCD.  Describes mood today as "ok". Pleasant. Denies tearfulness. Mood symptoms - denies depression, anxiety, and irritability. Denies worry and rumination. Mood is consistent - "good". Stating "everything is good, with the exception of the shaking". Feels like medications continue to work well. Improved interest and motivation. Taking medications as prescribed. Energy levels stable. Active, walking some days. Enjoys some usual interests and activities. Married. Lives with wife. Has 2 grown children. Spending time with family. Appetite adequate. Weight stable - 250 pounds. Sleeps well most nights. Averages 8 to 10 hours. Reports some daytime napping. Focus and concentration good. Completing tasks. Managing aspects of household. Retired Forensic psychologist and professor.  Denies SI or HI.  Denies AH or VH.    PHQ2-9    Flowsheet Row CARDIAC REHAB PHASE II EXERCISE from 10/02/2014 in [place] from 07/08/2014 in [place]  PHQ-2 Total Score 0 0        Review of Systems:  Review of Systems  Musculoskeletal:  Negative for gait problem.  Neurological:  Negative for tremors.  Psychiatric/Behavioral:         Please refer to HPI    Medications: I have reviewed the patient's current medications.  Current Outpatient Medications  Medication Sig Dispense Refill   acetaminophen (TYLENOL) 500 MG tablet Take 1,000 mg by mouth every 6 (six) hours as needed (pain).     ALPRAZolam (XANAX) 0.25 MG tablet Take 1 tablet (0.25 mg total) by mouth 2 (two) times daily as needed for anxiety. 60 tablet 2   amLODipine (NORVASC) 10 MG tablet Take 1 tablet (10 mg  total) by mouth daily. 90 tablet 3   apixaban (ELIQUIS) 5 MG TABS tablet Take 1 tablet (5 mg total) by mouth 2 (two) times daily. 180 tablet 3   brexpiprazole (REXULTI) 1 MG TABS tablet Take 1 tablet (1 mg total) by mouth daily. 90 tablet 3   fenofibrate (TRICOR) 145 MG tablet Take 1 tablet (145 mg total) by mouth daily. 90 tablet 3   fluvoxaMINE (LUVOX) 100 MG tablet Take 1 tablet (100 mg total) by mouth 2 (two) times daily. 180 tablet 3   furosemide (LASIX) 20 MG tablet TAKE 1 TABLET BY MOUTH  DAILY AS NEEDED FOR FLUID OR EDEMA 90 tablet 3   latanoprost (XALATAN) 0.005 % ophthalmic solution Place 1 drop into both eyes at bedtime.      leuprolide, 6 Month, (ELIGARD) 45 MG injection Inject 45 mg into the skin every 6 (six) months.     levothyroxine (SYNTHROID) 137 MCG tablet Take 137 mcg by mouth every morning.     lisinopril (ZESTRIL) 40 MG tablet Take 1 tablet (40 mg total) by mouth daily. 90 tablet 3   metFORMIN (GLUCOPHAGE-XR) 500 MG 24 hr tablet Take 1 tablet by mouth at bedtime.     metoprolol succinate (TOPROL-XL) 100 MG 24 hr tablet Take 1 tablet (100 mg total) by mouth daily. TAKE WITH OR IMMEDIATELY FOLLOWING A MEAL. 90 tablet 3   MYRBETRIQ 50 MG TB24 tablet Take 50 mg by mouth daily.     nitroGLYCERIN (NITROSTAT) 0.4 MG SL tablet Place 1 tablet (0.4 mg total) under the tongue every  5 (five) minutes as needed for chest pain. 25 tablet 4   pantoprazole (PROTONIX) 40 MG tablet Take 2 tablets (80 mg total) by mouth daily. 180 tablet 3   rosuvastatin (CRESTOR) 40 MG tablet Take 1 tablet (40 mg total) by mouth daily. 90 tablet 3   No current facility-administered medications for this visit.    Medication Side Effects: None  Allergies:  Allergies  Allergen Reactions   Cimetidine Other (See Comments)    Unknown - over 30 years ago (2021) - unsure what occurred it may have been hives or flushing Other reaction(s): flushed   Atorvastatin     Myalgias on '80mg'$  dose   Niacin And Related  Other (See Comments)    Flushing     Past Medical History:  Diagnosis Date   Allergic rhinitis    Aortic atherosclerosis ([place])    Arthritis    "a little bit in some of my joints" (06/20/2014)   Ascending aorta dilatation (HCC)    61m by chest CTA 05/2020 and 42 mm by chest MRI MRA 05/2021   Chronic diastolic CHF (congestive heart failure) (HCC)    Coronary artery disease    a. 05/2014 Cath/PCI: LM nl, LAD 40p, 60-70d (FFR 0.80->3.0x24 Synergy DES), D2 40, LCX 90d (2.25x12 Synergy DES), RCA 20-37mEF 60-65%. b. Cath 02/2017 without acute change, elev LVEDP suggestive of dCHF.   Depression    Dilated aortic root (HCC)    aortic root at 4485my Chest CTA 11/2019   ED (erectile dysfunction)    Environmental allergies    dry cough   GERD (gastroesophageal reflux disease)    History of hiatal hernia    Hydronephrosis of right kidney    a. s/p ureteral stenting in the past.   Hyperlipidemia    under control   Hypertension    Hypothyroidism    OCD (obsessive compulsive disorder)    Pacemaker    PAF (paroxysmal atrial fibrillation) (HCC)    Panic disorder    PAT (paroxysmal atrial tachycardia) (HCC)    PONV (postoperative nausea and vomiting)    after thyroid surgery no problems in 10 years    Prostate cancer ([facility]  prostate cancer- robotic prostatectomy - followed by radiation because of positive lymph node--and pt on lupron injections   PVC's (premature ventricular contractions)    Sinus bradycardia    a. chronic, asymptomatic.  24 hour Holter 10/2016 showed Sinus bradycardia, sinus arrhythmia and normal sinus rhythm with average heart rate 50bpm and heart rate ranged from 32 to 100bpm.   SUI (stress urinary incontinence), male    S/P ROBOTIC PROSTATECTOMY AND RADIATION TX    Past Medical History, Surgical history, Social history, and Family history were reviewed and updated as appropriate.   Please see review of systems for further details on the patient's review from today.    Objective:   Physical Exam:  There were no vitals taken for this visit.  Physical Exam Constitutional:      General: He is not in acute distress. Musculoskeletal:        General: No deformity.  Neurological:     Mental Status: He is alert and oriented to person, place, and time.     Coordination: Coordination normal.  Psychiatric:        Attention and Perception: Attention and perception normal. He does not perceive auditory or visual hallucinations.        Mood and Affect: Mood normal. Mood is not anxious or depressed.  Affect is not labile, blunt, angry or inappropriate.        Speech: Speech normal.        Behavior: Behavior normal.        Thought Content: Thought content normal. Thought content is not paranoid or delusional. Thought content does not include homicidal or suicidal ideation. Thought content does not include homicidal or suicidal plan.        Cognition and Memory: Cognition and memory normal.        Judgment: Judgment normal.     Comments: Insight intact     Lab Review:     Component Value Date/Time   NA 143 08/11/2021 0954   K 4.3 08/11/2021 0954   CL 107 (H) 08/11/2021 0954   CO2 22 08/11/2021 0954   GLUCOSE 92 08/11/2021 0954   GLUCOSE 98 10/31/2018 1247   BUN 23 08/11/2021 0954   CREATININE 1.03 08/11/2021 0954   CREATININE 0.92 10/31/2018 1247   CREATININE 1.07 10/14/2015 0850   CALCIUM 9.6 08/11/2021 0954   PROT 6.8 02/09/2022 1001   ALBUMIN 4.5 02/09/2022 1001   AST 20 02/09/2022 1001   AST 19 10/31/2018 1247   ALT 19 02/09/2022 1001   ALT 25 10/31/2018 1247   ALKPHOS 55 02/09/2022 1001   BILITOT 0.2 02/09/2022 1001   BILITOT 0.3 10/31/2018 1247   GFRNONAA 60 07/03/2020 1146   GFRNONAA >60 10/31/2018 1247   GFRAA 69 07/03/2020 1146   GFRAA >60 10/31/2018 1247       Component Value Date/Time   WBC 4.4 08/11/2021 0954   WBC 6.1 12/26/2019 0931   RBC 4.00 (L) 08/11/2021 0954   RBC 4.11 (L) 12/26/2019 0931   HGB 11.5 (L) 08/11/2021  0954   HCT 34.5 (L) 08/11/2021 0954   PLT 243 08/11/2021 0954   MCV 86 08/11/2021 0954   MCH 28.8 08/11/2021 0954   MCH 29.1 10/31/2018 1247   MCHC 33.3 08/11/2021 0954   MCHC 33.3 12/26/2019 0931   RDW 13.2 08/11/2021 0954   LYMPHSABS 1.4 12/26/2019 0931   LYMPHSABS 1.7 05/07/2019 1319   MONOABS 0.6 12/26/2019 0931   EOSABS 0.3 12/26/2019 0931   EOSABS 0.2 05/07/2019 1319   BASOSABS 0.0 12/26/2019 0931   BASOSABS 0.0 05/07/2019 1319    No results found for: "POCLITH", "LITHIUM"   No results found for: "PHENYTOIN", "PHENOBARB", "VALPROATE", "CBMZ"   .res Assessment: Plan:    Plan:  PDMP reviewed  1. Luvox '200mg'$  daily 2. Decrease Rexulti '1mg'$  to 0.'5mg'$  daily - reports teeth chattering, hands shaking, hand clenching at night.  Consider Ingrezza - discussed - patient will review.  RTC  4 weeks  Patient advised to contact office with any questions, adverse effects, or acute worsening in signs and symptoms.  Discussed potential benefits, risk, and side effects of benzodiazepines to include potential risk of tolerance and dependence, as well as possible drowsiness. Advised patient not to drive if experiencing drowsiness and to take lowest possible effective dose to minimize risk of dependence and tolerance.   Diagnoses and all orders for this visit:  Major depressive disorder, recurrent episode, moderate ([place])     Please see After Visit Summary for patient specific instructions.  Future Appointments  Date Time Provider [place]  05/17/2022  7:05 AM [facility]  08/16/2022  7:10 AM [facility]ICE [facility]OTES [facility]  11/15/2022  7:05 AM [facility]OTES [facility]  02/14/2023  7:05 AM [facility]ICE [facility]OTES [facility]  05/16/2023  7:05 AM [facility]OT[facility]  08/15/2023  7:05 AM [facility]ICE [facility]ES [facility]   11/14/2023  7:05 AM [facility]ICE [facility]ES [facility]    No orders of the defined types were placed in this encounter.   -------------------------------

## 2022-03-12 DIAGNOSIS — U071 COVID-19: Secondary | ICD-10-CM | POA: Diagnosis not present

## 2022-03-29 DIAGNOSIS — R7309 Other abnormal glucose: Secondary | ICD-10-CM | POA: Diagnosis not present

## 2022-03-29 DIAGNOSIS — E669 Obesity, unspecified: Secondary | ICD-10-CM | POA: Diagnosis not present

## 2022-03-29 DIAGNOSIS — E890 Postprocedural hypothyroidism: Secondary | ICD-10-CM | POA: Diagnosis not present

## 2022-03-29 DIAGNOSIS — I2510 Atherosclerotic heart disease of native coronary artery without angina pectoris: Secondary | ICD-10-CM | POA: Diagnosis not present

## 2022-03-29 DIAGNOSIS — I10 Essential (primary) hypertension: Secondary | ICD-10-CM | POA: Diagnosis not present

## 2022-03-30 ENCOUNTER — Other Ambulatory Visit: Payer: Self-pay | Admitting: Adult Health

## 2022-03-30 DIAGNOSIS — F411 Generalized anxiety disorder: Secondary | ICD-10-CM

## 2022-03-30 DIAGNOSIS — F429 Obsessive-compulsive disorder, unspecified: Secondary | ICD-10-CM

## 2022-03-30 NOTE — Telephone Encounter (Signed)
Has appt with [facility] 11/2

## 2022-03-31 DIAGNOSIS — C61 Malignant neoplasm of prostate: Secondary | ICD-10-CM | POA: Diagnosis not present

## 2022-04-01 ENCOUNTER — Encounter: Payer: Self-pay | Admitting: Adult Health

## 2022-04-01 ENCOUNTER — Ambulatory Visit (INDEPENDENT_AMBULATORY_CARE_PROVIDER_SITE_OTHER): Payer: Medicare PPO | Admitting: Adult Health

## 2022-04-01 DIAGNOSIS — F331 Major depressive disorder, recurrent, moderate: Secondary | ICD-10-CM

## 2022-04-01 DIAGNOSIS — F429 Obsessive-compulsive disorder, unspecified: Secondary | ICD-10-CM | POA: Diagnosis not present

## 2022-04-01 DIAGNOSIS — F411 Generalized anxiety disorder: Secondary | ICD-10-CM | POA: Diagnosis not present

## 2022-04-01 NOTE — Progress Notes (Signed)
Blake Carter [id number] May 20, 1951 71 y.o.  Subjective:   Patient ID:  Blake Carter is a 71 y.o. (DOB June 03, 1950) male.  Chief Complaint: No chief complaint on file.   HPI Blake Carter presents to the office today for follow-up of MDD, GAD and OCD.  Describes mood today as "ok". Pleasant. Denies tearfulness. Mood symptoms - denies depression, anxiety, and irritability. Denies worry and rumination. Mood is consistent. Stating "I'm doing good". Continues to report teeth chattering, lip movements and hands shaking with the lower dose of Rexulti - willing to discontinue. Stable interest and motivation. Taking medications as prescribed. Energy levels stable. Active, walking some days. Enjoys some usual interests and activities. Married. Lives with wife. Has 2 grown children. Spending time with family. Appetite adequate. Weight stable - 250 pounds. Sleeps well most nights. Averages 8 to 10 hours. Reports some daytime napping. Focus and concentration good. Completing tasks. Managing aspects of household. Retired Forensic psychologist and professor.  Denies SI or HI.  Denies AH or VH.    PHQ2-9    Flowsheet Row CARDIAC REHAB PHASE II EXERCISE from 10/02/2014 in [place] from 07/08/2014 in [place]  PHQ-2 Total Score 0 0        Review of Systems:  Review of Systems  Medications: I have reviewed the patient's current medications.  Current Outpatient Medications  Medication Sig Dispense Refill   acetaminophen (TYLENOL) 500 MG tablet Take 1,000 mg by mouth every 6 (six) hours as needed (pain).     ALPRAZolam (XANAX) 0.25 MG tablet Take 1 tablet (0.25 mg total) by mouth 2 (two) times daily as needed for anxiety. 60 tablet 2   amLODipine (NORVASC) 10 MG tablet Take 1 tablet (10 mg total) by mouth daily. 90 tablet 3   apixaban (ELIQUIS) 5 MG TABS tablet Take 1 tablet (5 mg total) by mouth 2 (two)  times daily. 180 tablet 3   Brexpiprazole (REXULTI) 0.5 MG TABS Take 1 tablet (0.5 mg total) by mouth daily. 30 tablet 2   fenofibrate (TRICOR) 145 MG tablet Take 1 tablet (145 mg total) by mouth daily. 90 tablet 3   fluvoxaMINE (LUVOX) 100 MG tablet Take 1 tablet (100 mg total) by mouth 2 (two) times daily. 180 tablet 3   furosemide (LASIX) 20 MG tablet TAKE 1 TABLET BY MOUTH  DAILY AS NEEDED FOR FLUID OR EDEMA 90 tablet 3   latanoprost (XALATAN) 0.005 % ophthalmic solution Place 1 drop into both eyes at bedtime.      leuprolide, 6 Month, (ELIGARD) 45 MG injection Inject 45 mg into the skin every 6 (six) months.     levothyroxine (SYNTHROID) 137 MCG tablet Take 137 mcg by mouth every morning.     lisinopril (ZESTRIL) 40 MG tablet Take 1 tablet (40 mg total) by mouth daily. 90 tablet 3   metFORMIN (GLUCOPHAGE-XR) 500 MG 24 hr tablet Take 1 tablet by mouth at bedtime.     metoprolol succinate (TOPROL-XL) 100 MG 24 hr tablet Take 1 tablet (100 mg total) by mouth daily. TAKE WITH OR IMMEDIATELY FOLLOWING A MEAL. 90 tablet 3   MYRBETRIQ 50 MG TB24 tablet Take 50 mg by mouth daily.     nitroGLYCERIN (NITROSTAT) 0.4 MG SL tablet Place 1 tablet (0.4 mg total) under the tongue every 5 (five) minutes as needed for chest pain. 25 tablet 4   pantoprazole (PROTONIX) 40 MG tablet Take 2 tablets (80 mg total) by  mouth daily. 180 tablet 3   rosuvastatin (CRESTOR) 40 MG tablet Take 1 tablet (40 mg total) by mouth daily. 90 tablet 3   No current facility-administered medications for this visit.    Medication Side Effects: None  Allergies:  Allergies  Allergen Reactions   Cimetidine Other (See Comments)    Unknown - over 30 years ago (2021) - unsure what occurred it may have been hives or flushing Other reaction(s): flushed   Atorvastatin     Myalgias on '80mg'$  dose   Niacin And Related Other (See Comments)    Flushing     Past Medical History:  Diagnosis Date   Allergic rhinitis    Aortic  atherosclerosis ([place])    Arthritis    "a little bit in some of my joints" (06/20/2014)   Ascending aorta dilatation (HCC)    2m by chest CTA 05/2020 and 42 mm by chest MRI MRA 05/2021   Chronic diastolic CHF (congestive heart failure) (HCC)    Coronary artery disease    a. 05/2014 Cath/PCI: LM nl, LAD 40p, 60-70d (FFR 0.80->3.0x24 Synergy DES), D2 40, LCX 90d (2.25x12 Synergy DES), RCA 20-371mEF 60-65%. b. Cath 02/2017 without acute change, elev LVEDP suggestive of dCHF.   Depression    Dilated aortic root (HCC)    aortic root at 4427my Chest CTA 11/2019   ED (erectile dysfunction)    Environmental allergies    dry cough   GERD (gastroesophageal reflux disease)    History of hiatal hernia    Hydronephrosis of right kidney    a. s/p ureteral stenting in the past.   Hyperlipidemia    under control   Hypertension    Hypothyroidism    OCD (obsessive compulsive disorder)    Pacemaker    PAF (paroxysmal atrial fibrillation) (HCC)    Panic disorder    PAT (paroxysmal atrial tachycardia)    PONV (postoperative nausea and vomiting)    after thyroid surgery no problems in 10 years    [facility]state [facility] ([facility]  prostate cancer- robotic prostatectomy - followed by radiation because of positive lymph node--and pt on lupron injections   PVC's (premature ventricular contractions)    Sinus bradycardia    a. chronic, asymptomatic.  24 hour Holter 10/2016 showed Sinus bradycardia, sinus arrhythmia and normal sinus rhythm with average heart rate 50bpm and heart rate ranged from 32 to 100bpm.   SUI (stress urinary incontinence), male    S/P ROBOTIC PROSTATECTOMY AND RADIATION [place]    Past Medical History, Surgical history, Social history, and Family history were reviewed and updated as appropriate.   Please see review of systems for further details on the patient's review from today.   Objective:   Physical Exam:  There were no vitals taken for this visit.  Physical Exam Constitutional:       General: He is not in acute distress. Musculoskeletal:        General: No deformity.  Neurological:     Mental Status: He is alert and oriented to person, place, and time.     Coordination: Coordination normal.  Psychiatric:        Attention and Perception: Attention and perception normal. He does not perceive auditory or visual hallucinations.        Mood and Affect: Mood normal. Mood is not anxious or depressed. Affect is not labile, blunt, angry or inappropriate.        Speech: Speech normal.        Behavior:  Behavior normal.        Thought Content: Thought content normal. Thought content is not paranoid or delusional. Thought content does not include homicidal or suicidal ideation. Thought content does not include homicidal or suicidal plan.        Cognition and Memory: Cognition and memory normal.        Judgment: Judgment normal.     Comments: Insight intact     Lab Review:     Component Value Date/Time   NA 143 08/11/2021 0954   K 4.3 08/11/2021 0954   CL 107 (H) 08/11/2021 0954   CO2 22 08/11/2021 0954   GLUCOSE 92 08/11/2021 0954   GLUCOSE 98 10/31/2018 1247   BUN 23 08/11/2021 0954   CREATININE 1.03 08/11/2021 0954   CREATININE 0.92 10/31/2018 1247   CREATININE 1.07 10/14/2015 0850   CALCIUM 9.6 08/11/2021 0954   PROT 6.8 02/09/2022 1001   ALBUMIN 4.5 02/09/2022 1001   AST 20 02/09/2022 1001   AST 19 10/31/2018 1247   ALT 19 02/09/2022 1001   ALT 25 10/31/2018 1247   ALKPHOS 55 02/09/2022 1001   BILITOT 0.2 02/09/2022 1001   BILITOT 0.3 10/31/2018 1247   GFRNONAA 60 07/03/2020 1146   GFRNONAA >60 10/31/2018 1247   GFRAA 69 07/03/2020 1146   GFRAA >60 10/31/2018 1247       Component Value Date/Time   WBC 4.4 08/11/2021 0954   WBC 6.1 12/26/2019 0931   RBC 4.00 (L) 08/11/2021 0954   RBC 4.11 (L) 12/26/2019 0931   HGB 11.5 (L) 08/11/2021 0954   HCT 34.5 (L) 08/11/2021 0954   PLT 243 08/11/2021 0954   MCV 86 08/11/2021 0954   MCH 28.8 08/11/2021 0954    MCH 29.1 10/31/2018 1247   MCHC 33.3 08/11/2021 0954   MCHC 33.3 12/26/2019 0931   RDW 13.2 08/11/2021 0954   LYMPHSABS 1.4 12/26/2019 0931   LYMPHSABS 1.7 05/07/2019 1319   MONOABS 0.6 12/26/2019 0931   EOSABS 0.3 12/26/2019 0931   EOSABS 0.2 05/07/2019 1319   BASOSABS 0.0 12/26/2019 0931   BASOSABS 0.0 05/07/2019 1319    No results found for: "POCLITH", "LITHIUM"   No results found for: "PHENYTOIN", "PHENOBARB", "VALPROATE", "CBMZ"   .res Assessment: Plan:     Plan:  PDMP reviewed  1. Luvox '200mg'$  daily 2. Discontinue Rexulti 0.'5mg'$  daily - reports teeth chattering, hands shaking, hand clenching at night.  RTC  4 weeks  Patient advised to contact office with any questions, adverse effects, or acute worsening in signs and symptoms.  Discussed potential benefits, risk, and side effects of benzodiazepines to include potential risk of tolerance and dependence, as well as possible drowsiness. Advised patient not to drive if experiencing drowsiness and to take lowest possible effective dose to minimize risk of dependence and tolerance.  Diagnoses and all orders for this visit:  Major depressive disorder, recurrent episode, moderate (HCC)  Obsessive-compulsive disorder, unspecified type  Generalized anxiety disorder     Please see After Visit Summary for patient specific instructions.  Future Appointments  Date Time Provider [place]  05/17/2022  7:05 AM [facility]  07/02/2022  9:40 AM Jordan Pardini, Berdie Ogren, NP [facility] None  08/16/2022  7:10 AM [facility] DEVICE REMOTES [facility]  11/15/2022  7:05 AM [facility] REMOTES [facility]  02/14/2023  7:05 AM [facility] DEVICE REMOTES [facility]  05/16/2023  7:05 AM [facility] DEVICE REMOTES [facility]  08/15/2023  7:05 AM [facility]ICE REMOTES [facility]  11/14/2023  7:05 AM [facility] DEVICE  REMOTES [facility]    No orders of the defined types were placed in this encounter.   -------------------------------

## 2022-04-07 DIAGNOSIS — C7951 Secondary malignant neoplasm of bone: Secondary | ICD-10-CM | POA: Diagnosis not present

## 2022-04-07 DIAGNOSIS — C61 Malignant neoplasm of prostate: Secondary | ICD-10-CM | POA: Diagnosis not present

## 2022-05-17 ENCOUNTER — Ambulatory Visit (INDEPENDENT_AMBULATORY_CARE_PROVIDER_SITE_OTHER): Payer: Medicare PPO

## 2022-05-17 DIAGNOSIS — I442 Atrioventricular block, complete: Secondary | ICD-10-CM

## 2022-05-18 LAB — CUP PACEART REMOTE DEVICE CHECK
Date Time Interrogation Session: 20231219072034
Implantable Lead Connection Status: 753985
Implantable Lead Connection Status: 753985
Implantable Lead Implant Date: 20201221
Implantable Lead Implant Date: 20201221
Implantable Lead Location: 753859
Implantable Lead Location: 753860
Implantable Lead Model: 377169
Implantable Lead Model: 377171
Implantable Lead Serial Number: 7000011949
Implantable Lead Serial Number: 81083606
Implantable Pulse Generator Implant Date: 20201221
Pulse Gen Model: 407145
Pulse Gen Serial Number: 69703841

## 2022-05-26 IMAGING — CT CT ANGIO CHEST
2 of 8 series · 16 of 46 positions shown · IV contrast (OMNIPAQUE 350)
Comparison: 12/27/2019; 12/07/2018; 12/11/2013

CLINICAL DATA: Follow-up thoracic aortic aneurysm.

EXAM:
CT ANGIOGRAPHY CHEST WITH CONTRAST
TECHNIQUE: Multidetector CT imaging of the chest was performed using the
standard protocol during bolus administration of intravenous
contrast. Multiplanar CT image reconstructions and MIPs were
obtained to evaluate the vascular anatomy.
CONTRAST:  75mL OMNIPAQUE IOHEXOL 350 MG/ML SOLN

[Series 4: aorta 3.0 bf37 2 · axial · 0.83mm/px · z∈[-382,-92]mm · 13 of 115 slices shown]
[im 9/115  lung]
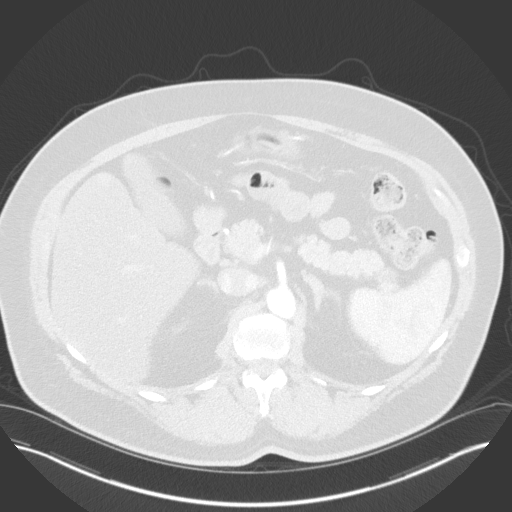
[im 17/115  soft-tissue]
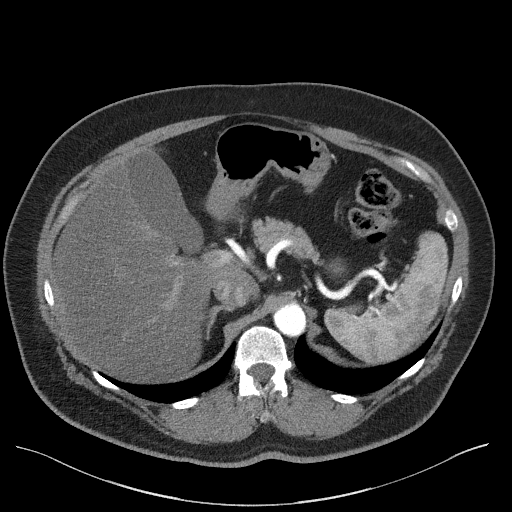
[im 25/115  lung]
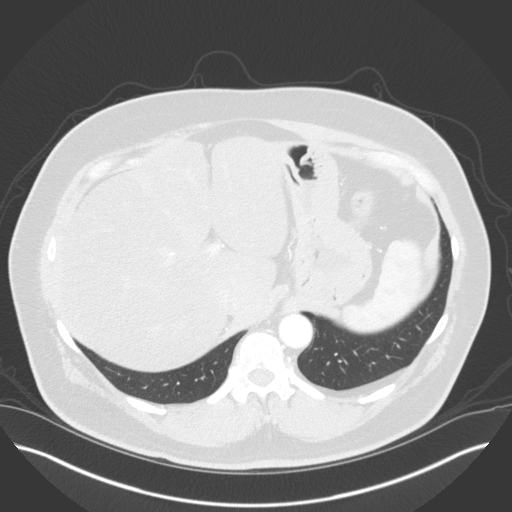
[im 33/115  soft-tissue]
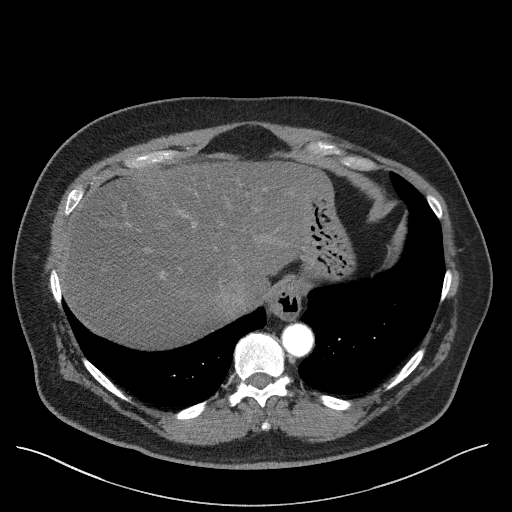
[im 41/115  lung]
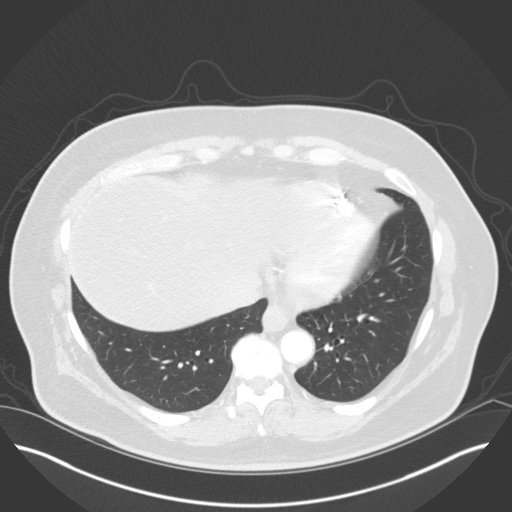
[im 49/115  soft-tissue]
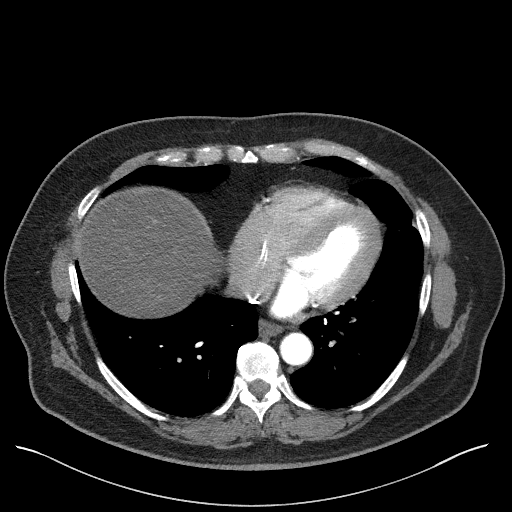
[im 58/115  lung]
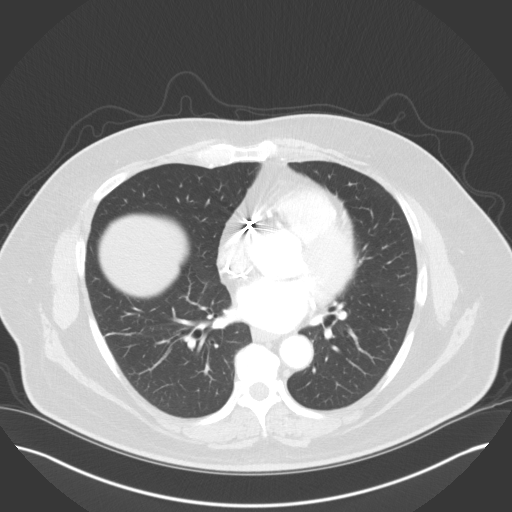
[im 66/115  soft-tissue]
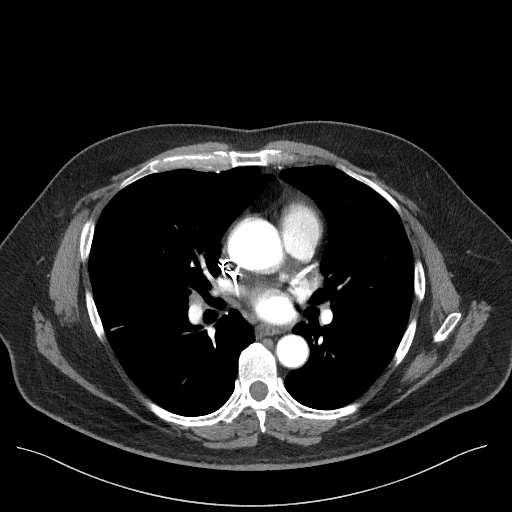
[im 74/115  lung]
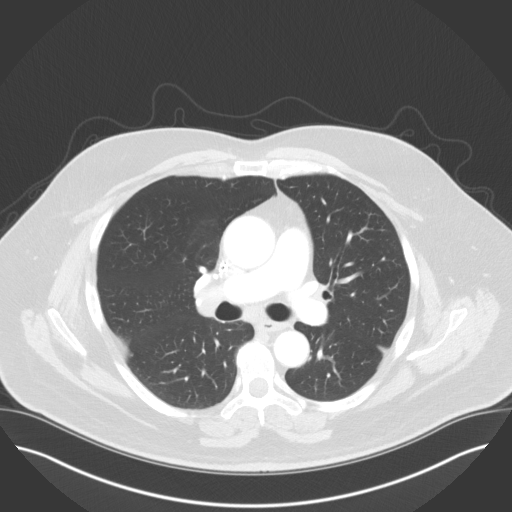
[im 82/115  soft-tissue]
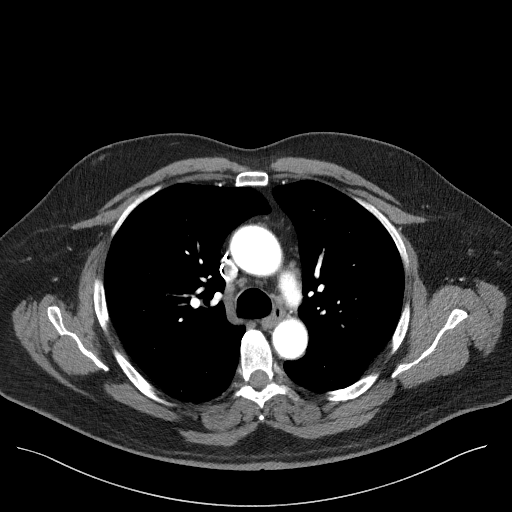
[im 90/115  lung]
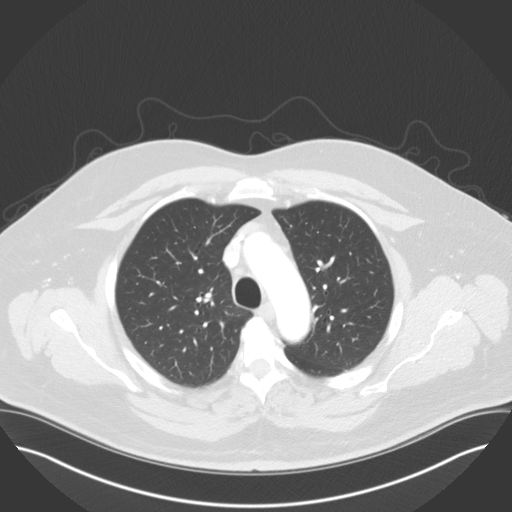
[im 98/115  soft-tissue]
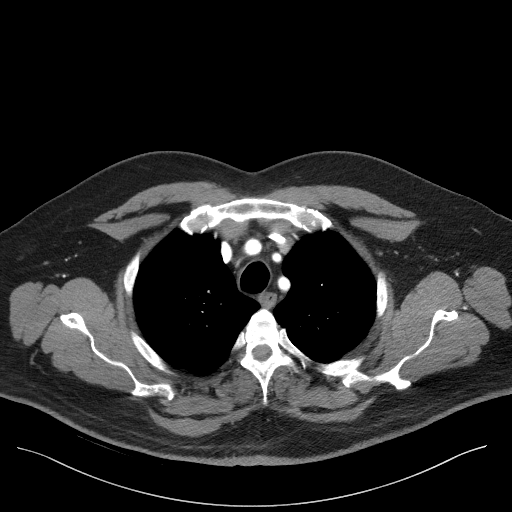
[im 106/115  lung]
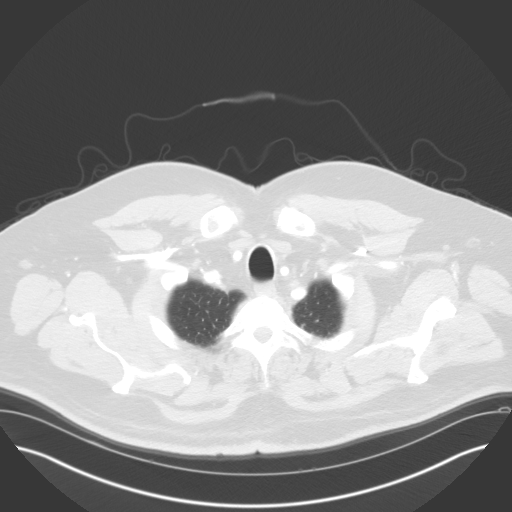

[Series 7: coronals · coronal · 0.70mm/px · 3 of 151 slices shown]
[im 38/151  soft-tissue]
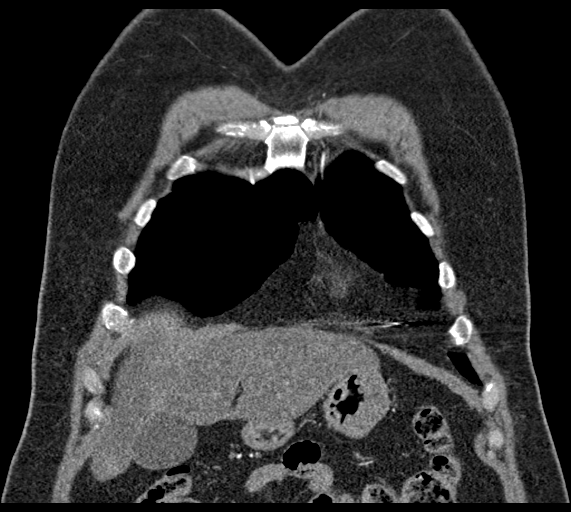
[im 76/151  soft-tissue]
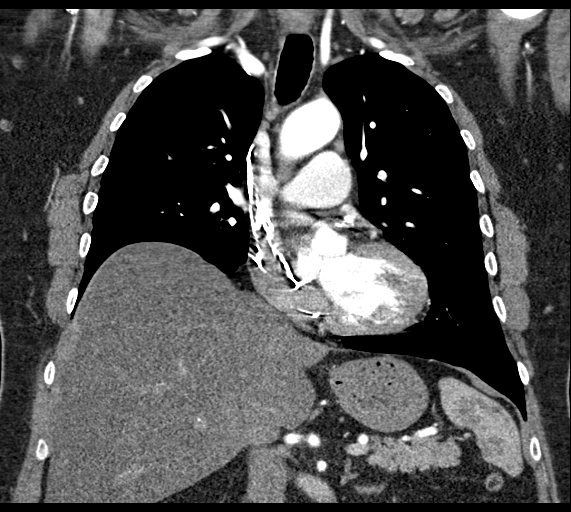
[im 113/151  soft-tissue]
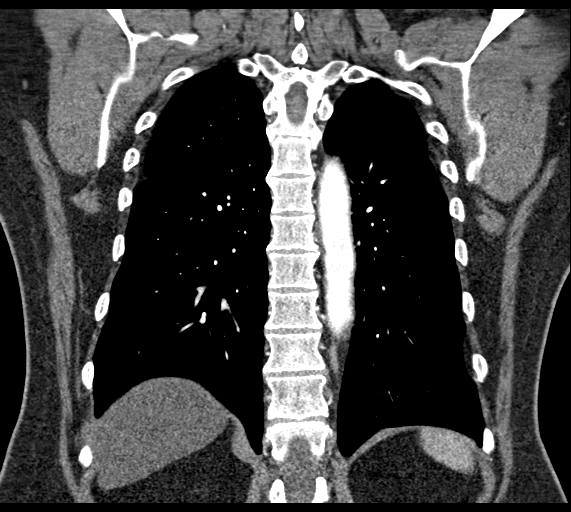

[16 of 46 positions shown; findings below may reference images not displayed]

FINDINGS: Vascular Findings:

Stable mild fusiform aneurysmal dilatation of the ascending thoracic
aorta with measurements as follows. The thoracic aorta tapers to a
normal caliber at the level of the aortic arch. The descending
thoracic aorta is of normal caliber. No evidence of thoracic aortic
dissection or periaortic stranding on this nongated examination.

Conventional configuration of the aortic arch. The branch vessels of
the aortic arch appear widely patent throughout their imaged
courses.

Normal heart size. Coronary artery calcifications. Left anterior
chest wall pacemaker with lead tips terminating right atrium and
ventricle.

Although this examination was not tailored for the evaluation the
pulmonary arteries, there are no discrete filling defects within the
central pulmonary arterial tree to suggest central pulmonary
embolism. Normal caliber of the main pulmonary artery.

-------------------------------------------------------------

Thoracic aortic measurements:

Sinotubular junction

38 mm as measured in greatest oblique short axis coronal dimension.

Proximal ascending aorta

42 mm as measured in greatest oblique short axis axial dimension at
the level of the main pulmonary artery (axial image 44, series 4)
and approximately 43 mm in greatest oblique short axis coronal
diameter (coronal image 64, series 7), grossly unchanged compared to
the [DATE] examination.

Aortic arch aorta

32 mm as measured in greatest oblique short axis sagittal dimension.

Proximal descending thoracic aorta

28 mm as measured in greatest oblique short axis axial dimension at
the level of the main pulmonary artery.

Distal descending thoracic aorta

26 mm as measured in greatest oblique short axis axial dimension at
the level of the diaphragmatic hiatus.

Review of the MIP images confirms the above findings.

-------------------------------------------------------------

Non-Vascular Findings:

Mediastinum/Lymph Nodes: Scattered mediastinal and right hilar lymph
nodes appear morphologically similar to the [DATE] examination with
index precarinal lymph node measuring 1.2 cm in greatest short axis
diameter (image 37, series 4 and index right suprahilar lymph node
measuring 1.3 cm (image 40, series 4), presumably reactive in
etiology. No bulky left hilar adenopathy. No axillary
lymphadenopathy.

Lungs/Pleura: Punctate (sub 4 mm) nodules within the right upper
(image 23, series 5) and lower (image 34, series 5) lobes a grossly
unchanged compared to the [DATE] examination though not seen on
more remote examinations. Punctate granuloma seen within right lower
lobe (image 49, series 5). No definitive new pulmonary nodules.

Minimal subsegmental atelectasis within the right lower lobe and
within the bilateral costophrenic angles. No discrete focal airspace
opacities. No pleural effusion or pneumothorax. The central
pulmonary airways mild nodular thickening involving the posterior
aspect of the trachea (image 21, series 5, similar to the [DATE]
examination. Central pulmonary airways appear widely patent.

Upper abdomen: Limited early arterial phase evaluation of the upper
abdomen demonstrates diffuse decreased attenuation of the hepatic
parenchyma compatible with hepatic steatosis. Layering radiopaque
gallstones are seen within an otherwise normal-appearing gallbladder
(image 103, series 4). Small hiatal hernia.

Musculoskeletal: Mild symmetric bilateral gynecomastia.
IMPRESSION: 1. Stable uncomplicated mild fusiform aneurysmal dilatation of the
ascending thoracic aorta measuring 43 mm in diameter, grossly
unchanged compared to the [DATE] examination.
2. Coronary artery calcifications. Aortic Atherosclerosis
(TBDEB-DDP.P).
3. Stable prominence of mediastinal and right hilar lymph nodes,
similar to the 7777 examination and thus favored to be reactive in
etiology.
4. Punctate (sub 4 mm) right upper and lower lobe pulmonary nodules
are unchanged compared to the [DATE] examination though not
confidently seen on more remote examinations. No follow-up needed if
patient is low-risk (and has no known or suspected primary
neoplasm). Non-contrast chest CT can be considered in 12 months if
patient is high-risk. This recommendation follows the consensus
statement: Guidelines for Management of Incidental Pulmonary Nodules
Detected on CT Images: From the [HOSPITAL] 9379; Radiology
9379; [DATE].
5. Similar findings of cholelithiasis without evidence of
cholecystitis and suspected hepatic steatosis.
6. Small hiatal hernia.

## 2022-06-25 NOTE — Progress Notes (Signed)
Remote pacemaker transmission.   

## 2022-07-02 ENCOUNTER — Encounter: Payer: Self-pay | Admitting: Adult Health

## 2022-07-02 ENCOUNTER — Ambulatory Visit: Payer: Medicare PPO | Admitting: Adult Health

## 2022-07-02 DIAGNOSIS — F411 Generalized anxiety disorder: Secondary | ICD-10-CM

## 2022-07-02 DIAGNOSIS — F331 Major depressive disorder, recurrent, moderate: Secondary | ICD-10-CM | POA: Diagnosis not present

## 2022-07-02 DIAGNOSIS — F429 Obsessive-compulsive disorder, unspecified: Secondary | ICD-10-CM

## 2022-07-02 NOTE — Progress Notes (Signed)
Vimal Derego [id number] [birth year] 72 y.o.  Subjective:   Patient ID:  Blake Carter is a 72 y.o. (DOB 01/07/1951) male.  Chief Complaint: No chief complaint on file.   HPI Blake Carter presents to the office today for follow-up of MDD, GAD and OCD.  Describes mood today as "ok". Pleasant. Denies tearfulness. Mood symptoms - denies depression, anxiety, and irritability. Denies worry and rumination. Mood is consistent. Stating "I'm doing good". Feels like Luvox continues to work well for him. Stable interest and motivation. Taking medications as prescribed. Energy levels stable. Active, walking some days. Enjoys some usual interests and activities. Married. Lives with wife. Has 2 grown children. Spending time with family. Appetite adequate. Weight stable - 250 pounds. Sleeps well most nights. Averages 8 to 10 hours. Denies daytime napping. Focus and concentration good. Completing tasks. Managing aspects of household. Retired Forensic psychologist and professor.  Denies SI or HI.  Denies AH or VH.   PHQ2-9    Flowsheet Row CARDIAC REHAB PHASE II EXERCISE from 10/02/2014 in [facility] [place] PHASE II EXERCISE from 07/08/2014 in [facility]  PHQ-2 Total Score 0 0        Review of Systems:  Review of Systems  Musculoskeletal:  Negative for gait problem.  Neurological:  Negative for tremors.  Psychiatric/Behavioral:         Please refer to HPI    Medications: I have reviewed the patient's current medications.  Current Outpatient Medications  Medication Sig Dispense Refill   acetaminophen (TYLENOL) 500 MG tablet Take 1,000 mg by mouth every 6 (six) hours as needed (pain).     ALPRAZolam (XANAX) 0.25 MG tablet Take 1 tablet (0.25 mg total) by mouth 2 (two) times daily as needed for anxiety. 60 tablet 2   amLODipine (NORVASC) 10 MG tablet Take 1 tablet (10 mg  total) by mouth daily. 90 tablet 3   apixaban (ELIQUIS) 5 MG TABS tablet Take 1 tablet (5 mg total) by mouth 2 (two) times daily. 180 tablet 3   Brexpiprazole (REXULTI) 0.5 MG TABS Take 1 tablet (0.5 mg total) by mouth daily. 30 tablet 2   fenofibrate (TRICOR) 145 MG tablet Take 1 tablet (145 mg total) by mouth daily. 90 tablet 3   fluvoxaMINE (LUVOX) 100 MG tablet TAKE 1 TABLET BY MOUTH TWICE A DAY 180 tablet 3   furosemide (LASIX) 20 MG tablet TAKE 1 TABLET BY MOUTH  DAILY AS NEEDED FOR FLUID OR EDEMA 90 tablet 3   latanoprost (XALATAN) 0.005 % ophthalmic solution Place 1 drop into both eyes at bedtime.      leuprolide, 6 Month, (ELIGARD) 45 MG injection Inject 45 mg into the skin every 6 (six) months.     levothyroxine (SYNTHROID) 137 MCG tablet Take 137 mcg by mouth every morning.     lisinopril (ZESTRIL) 40 MG tablet Take 1 tablet (40 mg total) by mouth daily. 90 tablet 3   metFORMIN (GLUCOPHAGE-XR) 500 MG 24 hr tablet Take 1 tablet by mouth at bedtime.     metoprolol succinate (TOPROL-XL) 100 MG 24 hr tablet Take 1 tablet (100 mg total) by mouth daily. TAKE WITH OR IMMEDIATELY FOLLOWING A MEAL. 90 tablet 3   MYRBETRIQ 50 MG TB24 tablet Take 50 mg by mouth daily.     nitroGLYCERIN (NITROSTAT) 0.4 MG SL tablet Place 1 tablet (0.4 mg total) under the tongue every 5 (five) minutes  as needed for chest pain. 25 tablet 4   pantoprazole (PROTONIX) 40 MG tablet Take 2 tablets (80 mg total) by mouth daily. 180 tablet 3   rosuvastatin (CRESTOR) 40 MG tablet Take 1 tablet (40 mg total) by mouth daily. 90 tablet 3   No current facility-administered medications for this visit.    Medication Side Effects: None  Allergies:  Allergies  Allergen Reactions   Cimetidine Other (See Comments)    Unknown - over 30 years ago (2021) - unsure what occurred it may have been hives or flushing Other reaction(s): flushed   Atorvastatin     Myalgias on '80mg'$  dose   Niacin And Related Other (See Comments)     Flushing     Past Medical History:  Diagnosis Date   Allergic rhinitis    Aortic atherosclerosis ([place])    Arthritis    "a little bit in some of my joints" (06/20/2014)   Ascending aorta dilatation (HCC)    40m by chest CTA 05/2020 and 42 mm by chest MRI MRA 05/2021   Chronic diastolic CHF (congestive heart failure) (HCC)    Coronary artery disease    a. 05/2014 Cath/PCI: LM nl, LAD 40p, 60-70d (FFR 0.80->3.0x24 Synergy DES), D2 40, LCX 90d (2.25x12 Synergy DES), RCA 20-346mEF 60-65%. b. Cath 02/2017 without acute change, elev LVEDP suggestive of dCHF.   Depression    Dilated aortic root (HCC)    aortic root at 4461my Chest CTA 11/2019   ED (erectile dysfunction)    Environmental allergies    dry cough   GERD (gastroesophageal reflux disease)    History of hiatal hernia    Hydronephrosis of right kidney    a. s/p ureteral stenting in the past.   Hyperlipidemia    under control   Hypertension    Hypothyroidism    OCD (obsessive compulsive disorder)    Pacemaker    PAF (paroxysmal atrial fibrillation) (HCC)    Panic disorder    PAT (paroxysmal atrial tachycardia)    PONV (postoperative nausea and vomiting)    after thyroid surgery no problems in 10 years    [place]state cancer- robotic prostatectomy - followed by radiation because of positive lymph node--and pt on lupron injections   PVC's (premature ventricular contractions)    Sinus bradycardia    a. chronic, asymptomatic.  24 hour Holter 10/2016 showed Sinus bradycardia, sinus arrhythmia and normal sinus rhythm with average heart rate 50bpm and heart rate ranged from 32 to 100bpm.   SUI (stress urinary incontinence), male    S/P ROBOTIC PROSTATECTOMY AND RADIATION TX    Past Medical History, Surgical history, Social history, and Family history were reviewed and updated as appropriate.   Please see review of systems for further details on the patient's review from today.   Objective:   Physical Exam:   There were no vitals taken for this visit.  Physical Exam Constitutional:      General: He is not in acute distress. Musculoskeletal:        General: No deformity.  Neurological:     Mental Status: He is alert and oriented to person, place, and time.     Coordination: Coordination normal.  Psychiatric:        Attention and Perception: Attention and perception normal. He does not perceive auditory or visual hallucinations.        Mood and Affect: Mood normal. Mood is not anxious or depressed. Affect is not labile,  blunt, angry or inappropriate.        Speech: Speech normal.        Behavior: Behavior normal.        Thought Content: Thought content normal. Thought content is not paranoid or delusional. Thought content does not include homicidal or suicidal ideation. Thought content does not include homicidal or suicidal plan.        Cognition and Memory: Cognition and memory normal.        Judgment: Judgment normal.     Comments: Insight intact     Lab Review:     Component Value Date/Time   NA 143 08/11/2021 0954   K 4.3 08/11/2021 0954   CL 107 (H) 08/11/2021 0954   CO2 22 08/11/2021 0954   GLUCOSE 92 08/11/2021 0954   GLUCOSE 98 10/31/2018 1247   BUN 23 08/11/2021 0954   CREATININE 1.03 08/11/2021 0954   CREATININE 0.92 10/31/2018 1247   CREATININE 1.07 10/14/2015 0850   CALCIUM 9.6 08/11/2021 0954   PROT 6.8 02/09/2022 1001   ALBUMIN 4.5 02/09/2022 1001   AST 20 02/09/2022 1001   AST 19 10/31/2018 1247   ALT 19 02/09/2022 1001   ALT 25 10/31/2018 1247   ALKPHOS 55 02/09/2022 1001   BILITOT 0.2 02/09/2022 1001   BILITOT 0.3 10/31/2018 1247   GFRNONAA 60 07/03/2020 1146   GFRNONAA >60 10/31/2018 1247   GFRAA 69 07/03/2020 1146   GFRAA >60 10/31/2018 1247       Component Value Date/Time   WBC 4.4 08/11/2021 0954   WBC 6.1 12/26/2019 0931   RBC 4.00 (L) 08/11/2021 0954   RBC 4.11 (L) 12/26/2019 0931   HGB 11.5 (L) 08/11/2021 0954   HCT 34.5 (L) 08/11/2021 0954    PLT 243 08/11/2021 0954   MCV 86 08/11/2021 0954   MCH 28.8 08/11/2021 0954   MCH 29.1 10/31/2018 1247   MCHC 33.3 08/11/2021 0954   MCHC 33.3 12/26/2019 0931   RDW 13.2 08/11/2021 0954   LYMPHSABS 1.4 12/26/2019 0931   LYMPHSABS 1.7 05/07/2019 1319   MONOABS 0.6 12/26/2019 0931   EOSABS 0.3 12/26/2019 0931   EOSABS 0.2 05/07/2019 1319   BASOSABS 0.0 12/26/2019 0931   BASOSABS 0.0 05/07/2019 1319    No results found for: "POCLITH", "LITHIUM"   No results found for: "PHENYTOIN", "PHENOBARB", "VALPROATE", "CBMZ"   .res Assessment: Plan:    Plan:  PDMP reviewed  1. Luvox '200mg'$  daily  RTC 6 months  Patient advised to contact office with any questions, adverse effects, or acute worsening in signs and symptoms.  Discussed potential benefits, risk, and side effects of benzodiazepines to include potential risk of tolerance and dependence, as well as possible drowsiness. Advised patient not to drive if experiencing drowsiness and to take lowest possible effective dose to minimize risk of dependence and tolerance.  There are no diagnoses linked to this encounter.   Please see After Visit Summary for patient specific instructions.  Future Appointments  Date Time Provider [place]  07/19/2022  1:00 PM [facility] 1 [facility]  08/16/2022  7:10 AM [facility] DEVICE REMOTES [facility]  11/15/2022  7:05 AM [facility] DEVICE REMOTES [facility]  02/14/2023  7:05 AM [facility] DEVICE REMOTES [facility]  05/16/2023  7:05 AM [facility] DEVICE REMOTES [facility]  08/15/2023  7:05 AM [facility] DEVICE REMOTES [facility]  11/14/2023  7:05 AM [facility] DEVICE REMOTES [facility]    No orders of the defined types were placed in this encounter.   -------------------------------

## 2022-07-05 DIAGNOSIS — C61 Malignant neoplasm of prostate: Secondary | ICD-10-CM | POA: Diagnosis not present

## 2022-07-19 ENCOUNTER — Ambulatory Visit (HOSPITAL_COMMUNITY)
Admission: RE | Admit: 2022-07-19 | Discharge: 2022-07-19 | Disposition: A | Discharge status: Home / Self Care | Payer: Medicare PPO | Source: Ambulatory Visit | Attending: Nurse Practitioner | Admitting: Nurse Practitioner

## 2022-07-19 DIAGNOSIS — I517 Cardiomegaly: Secondary | ICD-10-CM | POA: Diagnosis not present

## 2022-07-19 DIAGNOSIS — I7121 Aneurysm of the ascending aorta, without rupture: Secondary | ICD-10-CM | POA: Insufficient documentation

## 2022-07-19 DIAGNOSIS — I7789 Other specified disorders of arteries and arterioles: Secondary | ICD-10-CM | POA: Diagnosis not present

## 2022-07-19 DIAGNOSIS — I2510 Atherosclerotic heart disease of native coronary artery without angina pectoris: Secondary | ICD-10-CM | POA: Insufficient documentation

## 2022-07-19 DIAGNOSIS — I712 Thoracic aortic aneurysm, without rupture, unspecified: Secondary | ICD-10-CM | POA: Diagnosis not present

## 2022-07-19 DIAGNOSIS — E785 Hyperlipidemia, unspecified: Secondary | ICD-10-CM | POA: Insufficient documentation

## 2022-07-19 DIAGNOSIS — J929 Pleural plaque without asbestos: Secondary | ICD-10-CM | POA: Diagnosis not present

## 2022-07-19 MED ORDER — GADOBUTROL 1 MMOL/ML IV SOLN
10.0000 mL | Freq: Once | INTRAVENOUS | Status: AC | PRN
  Administered 2022-07-19: 10 mL via INTRAVENOUS

## 2022-08-16 ENCOUNTER — Ambulatory Visit (INDEPENDENT_AMBULATORY_CARE_PROVIDER_SITE_OTHER): Payer: Medicare PPO

## 2022-08-16 DIAGNOSIS — I442 Atrioventricular block, complete: Secondary | ICD-10-CM

## 2022-08-16 DIAGNOSIS — H401131 Primary open-angle glaucoma, bilateral, mild stage: Secondary | ICD-10-CM | POA: Diagnosis not present

## 2022-08-16 DIAGNOSIS — Z961 Presence of intraocular lens: Secondary | ICD-10-CM | POA: Diagnosis not present

## 2022-08-16 DIAGNOSIS — H52223 Regular astigmatism, bilateral: Secondary | ICD-10-CM | POA: Diagnosis not present

## 2022-08-16 DIAGNOSIS — H53143 Visual discomfort, bilateral: Secondary | ICD-10-CM | POA: Diagnosis not present

## 2022-08-16 DIAGNOSIS — H524 Presbyopia: Secondary | ICD-10-CM | POA: Diagnosis not present

## 2022-08-16 DIAGNOSIS — E559 Vitamin D deficiency, unspecified: Secondary | ICD-10-CM | POA: Diagnosis not present

## 2022-08-16 DIAGNOSIS — H5211 Myopia, right eye: Secondary | ICD-10-CM | POA: Diagnosis not present

## 2022-08-16 DIAGNOSIS — D649 Anemia, unspecified: Secondary | ICD-10-CM | POA: Diagnosis not present

## 2022-08-16 DIAGNOSIS — Z9849 Cataract extraction status, unspecified eye: Secondary | ICD-10-CM | POA: Diagnosis not present

## 2022-08-18 LAB — CUP PACEART REMOTE DEVICE CHECK
Battery Voltage: 75
Date Time Interrogation Session: 20240318081627
Implantable Lead Connection Status: 753985
Implantable Lead Connection Status: 753985
Implantable Lead Implant Date: 20201221
Implantable Lead Implant Date: 20201221
Implantable Lead Location: 753859
Implantable Lead Location: 753860
Implantable Lead Model: 377169
Implantable Lead Model: 377171
Implantable Lead Serial Number: 7000011949
Implantable Lead Serial Number: 81083606
Implantable Pulse Generator Implant Date: 20201221
Pulse Gen Model: 407145
Pulse Gen Serial Number: 69703841

## 2022-08-20 DIAGNOSIS — D649 Anemia, unspecified: Secondary | ICD-10-CM | POA: Diagnosis not present

## 2022-08-20 DIAGNOSIS — D6869 Other thrombophilia: Secondary | ICD-10-CM | POA: Diagnosis not present

## 2022-08-20 DIAGNOSIS — I712 Thoracic aortic aneurysm, without rupture, unspecified: Secondary | ICD-10-CM | POA: Diagnosis not present

## 2022-08-20 DIAGNOSIS — Z6835 Body mass index (BMI) 35.0-35.9, adult: Secondary | ICD-10-CM | POA: Diagnosis not present

## 2022-08-20 DIAGNOSIS — I480 Paroxysmal atrial fibrillation: Secondary | ICD-10-CM | POA: Diagnosis not present

## 2022-08-20 DIAGNOSIS — I5032 Chronic diastolic (congestive) heart failure: Secondary | ICD-10-CM | POA: Diagnosis not present

## 2022-08-20 DIAGNOSIS — I77810 Thoracic aortic ectasia: Secondary | ICD-10-CM | POA: Diagnosis not present

## 2022-08-20 DIAGNOSIS — Z0000 Encounter for general adult medical examination without abnormal findings: Secondary | ICD-10-CM | POA: Diagnosis not present

## 2022-09-27 DIAGNOSIS — E890 Postprocedural hypothyroidism: Secondary | ICD-10-CM | POA: Diagnosis not present

## 2022-09-27 DIAGNOSIS — R7309 Other abnormal glucose: Secondary | ICD-10-CM | POA: Diagnosis not present

## 2022-09-29 NOTE — Progress Notes (Signed)
Remote pacemaker transmission.   

## 2022-10-04 DIAGNOSIS — I10 Essential (primary) hypertension: Secondary | ICD-10-CM | POA: Diagnosis not present

## 2022-10-04 DIAGNOSIS — E7800 Pure hypercholesterolemia, unspecified: Secondary | ICD-10-CM | POA: Diagnosis not present

## 2022-10-04 DIAGNOSIS — E890 Postprocedural hypothyroidism: Secondary | ICD-10-CM | POA: Diagnosis not present

## 2022-10-04 DIAGNOSIS — R7309 Other abnormal glucose: Secondary | ICD-10-CM | POA: Diagnosis not present

## 2022-10-04 DIAGNOSIS — I2510 Atherosclerotic heart disease of native coronary artery without angina pectoris: Secondary | ICD-10-CM | POA: Diagnosis not present

## 2022-10-04 DIAGNOSIS — E669 Obesity, unspecified: Secondary | ICD-10-CM | POA: Diagnosis not present

## 2022-10-06 DIAGNOSIS — C61 Malignant neoplasm of prostate: Secondary | ICD-10-CM | POA: Diagnosis not present

## 2022-10-13 DIAGNOSIS — N3946 Mixed incontinence: Secondary | ICD-10-CM | POA: Diagnosis not present

## 2022-10-13 DIAGNOSIS — C61 Malignant neoplasm of prostate: Secondary | ICD-10-CM | POA: Diagnosis not present

## 2022-10-13 DIAGNOSIS — C7951 Secondary malignant neoplasm of bone: Secondary | ICD-10-CM | POA: Diagnosis not present

## 2022-10-20 ENCOUNTER — Other Ambulatory Visit (HOSPITAL_COMMUNITY): Payer: Self-pay | Admitting: Urology

## 2022-10-20 DIAGNOSIS — C61 Malignant neoplasm of prostate: Secondary | ICD-10-CM

## 2022-10-20 DIAGNOSIS — C7951 Secondary malignant neoplasm of bone: Secondary | ICD-10-CM

## 2022-11-08 ENCOUNTER — Encounter (HOSPITAL_COMMUNITY)
Admission: RE | Admit: 2022-11-08 | Discharge: 2022-11-08 | Disposition: A | Discharge status: Home / Self Care | Payer: Medicare PPO | Source: Ambulatory Visit | Attending: Urology | Admitting: Urology

## 2022-11-08 DIAGNOSIS — C61 Malignant neoplasm of prostate: Secondary | ICD-10-CM | POA: Diagnosis not present

## 2022-11-08 DIAGNOSIS — C7952 Secondary malignant neoplasm of bone marrow: Secondary | ICD-10-CM | POA: Diagnosis not present

## 2022-11-08 DIAGNOSIS — C7951 Secondary malignant neoplasm of bone: Secondary | ICD-10-CM | POA: Diagnosis not present

## 2022-11-08 MED ORDER — PIFLIFOLASTAT F 18 (PYLARIFY) INJECTION
9.0000 | Freq: Once | INTRAVENOUS | Status: AC
  Administered 2022-11-08: 9.67 via INTRAVENOUS

## 2022-11-15 ENCOUNTER — Ambulatory Visit (INDEPENDENT_AMBULATORY_CARE_PROVIDER_SITE_OTHER): Payer: Medicare PPO

## 2022-11-15 DIAGNOSIS — I442 Atrioventricular block, complete: Secondary | ICD-10-CM

## 2022-11-16 ENCOUNTER — Ambulatory Visit: Payer: Medicare PPO | Attending: Internal Medicine | Admitting: Internal Medicine

## 2022-11-16 ENCOUNTER — Encounter: Payer: Self-pay | Admitting: Internal Medicine

## 2022-11-16 VITALS — BP 102/66 | HR 77 | Ht 71.0 in | Wt 252.2 lb

## 2022-11-16 DIAGNOSIS — R002 Palpitations: Secondary | ICD-10-CM | POA: Diagnosis not present

## 2022-11-16 MED ORDER — AMLODIPINE BESYLATE 5 MG PO TABS: 5.0000 mg | ORAL_TABLET | Freq: Every day | ORAL | 3 refills | Status: DC

## 2022-11-16 NOTE — Progress Notes (Signed)
HPI  [name] returns today for followup. He is a pleasant 72 yo man with a h/o sinus node dysfunction, s/p PPM insertion. He underwent PPM insertion one year ago. He notes that he occaisionally feels like his heart beats hard. No clear provoking factors. He denies chest pain or sob. No edema.  He has developed metastatic prostate CA.     Current Outpatient Medications  Medication Sig Dispense Refill   acetaminophen (TYLENOL) 500 MG tablet Take 1,000 mg by mouth every 6 (six) hours as needed (pain).     ALPRAZolam (XANAX) 0.25 MG tablet Take 1 tablet (0.25 mg total) by mouth 2 (two) times daily as needed for anxiety. 60 tablet 2   apixaban (ELIQUIS) 5 MG TABS tablet Take 1 tablet (5 mg total) by mouth 2 (two) times daily. 180 tablet 3   fenofibrate (TRICOR) 145 MG tablet Take 1 tablet (145 mg total) by mouth daily. 90 tablet 3   fluvoxaMINE (LUVOX) 100 MG tablet TAKE 1 TABLET BY MOUTH TWICE A DAY 180 tablet 3   furosemide (LASIX) 20 MG tablet TAKE 1 TABLET BY MOUTH  DAILY AS NEEDED FOR FLUID OR EDEMA 90 tablet 3   latanoprost (XALATAN) 0.005 % ophthalmic solution Place 1 drop into both eyes at bedtime.      leuprolide (LUPRON DEPOT, 35-MONTH,) 30 MG injection Inject 30 mg into the muscle every 6 (six) months.     levothyroxine (SYNTHROID) 137 MCG tablet Take 137 mcg by mouth every morning.     lisinopril (ZESTRIL) 40 MG tablet Take 1 tablet (40 mg total) by mouth daily. 90 tablet 3   metFORMIN (GLUCOPHAGE-XR) 500 MG 24 hr tablet Take 1 tablet by mouth at bedtime.     metoprolol succinate (TOPROL-XL) 100 MG 24 hr tablet Take 1 tablet (100 mg total) by mouth daily. TAKE WITH OR IMMEDIATELY FOLLOWING A MEAL. 90 tablet 3   MYRBETRIQ 50 MG TB24 tablet Take 50 mg by mouth daily.     nitroGLYCERIN (NITROSTAT) 0.4 MG SL tablet Place 1 tablet (0.4 mg total) under the tongue every 5 (five) minutes as needed for chest pain. 25 tablet 4   pantoprazole (PROTONIX) 40 MG tablet Take 2 tablets  (80 mg total) by mouth daily. 180 tablet 3   rosuvastatin (CRESTOR) 40 MG tablet Take 1 tablet (40 mg total) by mouth daily. 90 tablet 3   amLODipine (NORVASC) 5 MG tablet Take 1 tablet (5 mg total) by mouth daily. 90 tablet 3   No current facility-administered medications for this visit.     Past Medical History:  Diagnosis Date   Allergic rhinitis    Aortic atherosclerosis (HCC)    Arthritis    "a little bit in some of my joints" (06/20/2014)   Ascending aorta dilatation (HCC)    43mm by chest CTA 05/2020 and 42 mm by chest MRI MRA 05/2021   Chronic diastolic CHF (congestive heart failure) (HCC)    Coronary artery disease    a. 05/2014 Cath/PCI: LM nl, LAD 40p, 60-70d (FFR 0.80->3.0x24 Synergy DES), D2 40, LCX 90d (2.25x12 Synergy DES), RCA 20-73m, EF 60-65%. b. Cath 02/2017 without acute change, elev LVEDP suggestive of dCHF.   Depression    Dilated aortic root (HCC)    aortic root at 44mm by Chest CTA 11/2019   ED (erectile dysfunction)    Environmental allergies    dry cough   GERD (gastroesophageal reflux disease)    History of hiatal hernia  Hydronephrosis of right kidney    a. s/p ureteral stenting in the past.   Hyperlipidemia    under control   Hypertension    Hypothyroidism    OCD (obsessive compulsive disorder)    Pacemaker    PAF (paroxysmal atrial fibrillation) (HCC)    Panic disorder    PAT (paroxysmal atrial tachycardia)    PONV (postoperative nausea and vomiting)    after thyroid surgery no problems in 10 years    Prostate cancer (HCC)    prostate cancer- robotic prostatectomy - followed by radiation because of positive lymph node--and pt on lupron injections   PVC's (premature ventricular contractions)    Sinus bradycardia    a. chronic, asymptomatic.  24 hour Holter 10/2016 showed Sinus bradycardia, sinus arrhythmia and normal sinus rhythm with average heart rate 50bpm and heart rate ranged from 32 to 100bpm.   SUI (stress urinary incontinence), male     S/P ROBOTIC PROSTATECTOMY AND RADIATION TX    ROS:   All systems reviewed and negative except as noted in the HPI.   Past Surgical History:  Procedure Laterality Date   COLONOSCOPY  07/2013   Dr. Randa Evens   CORONARY ANGIOPLASTY WITH STENT PLACEMENT  06/20/2014   "2"   CORONARY PRESSURE/FFR STUDY N/A 07/08/2020   Procedure: INTRAVASCULAR PRESSURE WIRE/FFR STUDY;  Surgeon: Lyn Records, MD;  Location: [facility] INVASIVE CV LAB;  Service: Cardiovascular;  Laterality: N/A;   CYSTOSCOPY  11/30/2011   Procedure: CYSTOSCOPY;  Surgeon: Martina Sinner, MD;  Location: [facility] ORS;  Service: Urology;  Laterality: N/A;  Inplantation of Artificial Sphincter and Cystoscopy   CYSTOSCOPY WITH RETROGRADE PYELOGRAM, URETEROSCOPY AND STENT PLACEMENT Right 02/11/2014   Procedure: CYSTOSCOPY WITH RETROGRADE PYELOGRAM, URETEROSCOPY ,URETERAL BIOPSY AND STENT PLACEMENT;  Surgeon: Heloise Purpura, MD;  Location: [facility];  Service: Urology;  Laterality: Right;   CYSTOSCOPY WITH RETROGRADE PYELOGRAM, URETEROSCOPY AND STENT PLACEMENT Right 04/15/2014   Procedure: CYSTOSCOPY WITH RETROGRADE PYELOGRAM, URETEROSCOPY, BIOPSY AND STENT PLACEMENT;  Surgeon: Heloise Purpura, MD;  Location: [facility];  Service: Urology;  Laterality: Right;   LEFT HEART CATH AND CORONARY ANGIOGRAPHY N/A 03/29/2017   Procedure: LEFT HEART CATH AND CORONARY ANGIOGRAPHY;  Surgeon: Marykay Lex, MD;  Location: [facility] INVASIVE CV LAB;  Service: Cardiovascular;  Laterality: N/A;   LEFT HEART CATHETERIZATION WITH CORONARY ANGIOGRAM N/A 06/20/2014   Procedure: LEFT HEART CATHETERIZATION WITH CORONARY ANGIOGRAM;  Surgeon: Marykay Lex, MD;  Location: [facility] CATH LAB;  Service: Cardiovascular;  Laterality: N/A;   PACEMAKER IMPLANT N/A 05/21/2019   Procedure: PACEMAKER IMPLANT;  Surgeon: Marinus Maw, MD;  Location: [facility] INVASIVE CV LAB;  Service: Cardiovascular;  Laterality: N/A;   PERCUTANEOUS CORONARY STENT INTERVENTION (PCI-S)  06/20/2014   Procedure: PERCUTANEOUS  CORONARY STENT INTERVENTION (PCI-S);  Surgeon: Marykay Lex, MD;  Location: [facility] CATH LAB;  Service: Cardiovascular;;  LAD and Circumflex   REFRACTIVE SURGERY Bilateral 2004   RIGHT/LEFT HEART CATH AND CORONARY ANGIOGRAPHY N/A 07/08/2020   Procedure: RIGHT/LEFT HEART CATH AND CORONARY ANGIOGRAPHY;  Surgeon: Lyn Records, MD;  Location: [facility] INVASIVE CV LAB;  Service: Cardiovascular;  Laterality: N/A;   ROBOT ASSISTED LAPAROSCOPIC RADICAL PROSTATECTOMY  05/2010   THYROIDECTOMY, PARTIAL  10/1974   TONSILLECTOMY  1956    TOTAL THYROIDECTOMY  10/2012   "nuked it"   UPPER GI ENDOSCOPY     food impaction 2009 done by Dr Randa Evens   URINARY SPHINCTER IMPLANT  11/30/2011   Procedure: ARTIFICIAL URINARY SPHINCTER;  Surgeon: Lorin Picket  A MacDiarmid, MD;  Location: [facility];  Service: Urology;  Laterality: N/A;     Family History  Problem Relation Age of Onset   Cancer Father    Hyperlipidemia Father    Heart disease Father        Father had CABG/aorta repair by the time he was in his 18s   CAD Brother        Brother who is 10 years younger has had some sort of stenting     Social History   Socioeconomic History   Marital status: Married    Spouse name: Not on file   Number of children: Not on file   Years of education: Not on file   Highest education level: Not on file  Occupational History   Not on file  Tobacco Use   Smoking status: Never   Smokeless tobacco: Never  Vaping Use   Vaping Use: Never used  Substance and Sexual Activity   Alcohol use: Yes    Alcohol/week: 0.0 standard drinks of alcohol    Comment: 06/20/2014 "1-2 drinks a couple times/month"   Drug use: No   Sexual activity: Yes  Other Topics Concern   Not on file  Social History Narrative   Not on file   Social Determinants of Health   Financial Resource Strain: Not on file  Food Insecurity: Not on file  Transportation Needs: Not on file  Physical Activity: Not on file  Stress: Not on file  Social Connections: Not on  file  Intimate Partner Violence: Not on file     BP 102/66   Pulse 77   Ht 5\' 11"  (1.803 m)   Wt 252 lb 3.2 oz (114.4 kg)   SpO2 97%   BMI 35.17 kg/m   Physical Exam:  Well appearing NAD HEENT: Unremarkable Neck:  No JVD, no thyromegally Lymphatics:  No adenopathy Back:  No CVA tenderness Lungs:  Clear with no wheezes HEART:  Regular rate rhythm, no murmurs, no rubs, no clicks Abd:  soft, positive bowel sounds, no organomegally, no rebound, no guarding Ext:  2 plus pulses, no edema, no cyanosis, no clubbing Skin:  No rashes no nodules Neuro:  CN II through XII intact, motor grossly intact  EKG - nsr with atrial pacing  DEVICE  Normal device function.  See PaceArt for details.   Assess/Plan: 1. Sinus node dysfunction - he is asymptomatic, s/p PPM insertion. 2. PPM - His biotronik DDD PM is working normally. We will followup in several months. 3. Palpitations - unclear if this is related to pacing or PVC's.  4. Obesity - he has not change in the last year. He is encouraged to lose weight.   Blake Gowda Sybilla Malhotra,MD

## 2022-11-16 NOTE — Patient Instructions (Signed)
Medication Instructions:  Your physician has recommended you make the following change in your medication:  1-DECREASE Amlodipine 5 mg by mouth daily.  *If you need a refill on your cardiac medications before your next appointment, please call your pharmacy*   Lab Work: If you have labs (blood work) drawn today and your tests are completely normal, you will receive your results only by: MyChart Message (if you have MyChart) OR A paper copy in the mail If you have any lab test that is abnormal or we need to change your treatment, we will call you to review the results.  Follow-Up: At [facility], you and your health needs are our priority.  As part of our continuing mission to provide you with exceptional heart care, we have created designated Provider Care Teams.  These Care Teams include your primary Cardiologist (physician) and Advanced Practice Providers (APPs -  Physician Assistants and Nurse Practitioners) who all work together to provide you with the care you need, when you need it.  We recommend signing up for the patient portal called "MyChart".  Sign up information is provided on this After Visit Summary.  MyChart is used to connect with patients for Virtual Visits (Telemedicine).  Patients are able to view lab/test results, encounter notes, upcoming appointments, etc.  Non-urgent messages can be sent to your provider as well.   To learn more about what you can do with MyChart, go to [email or web address].    Your next appointment:   1 year(s)  Provider:   You may see Lewayne Bunting, MD or one of the following Advanced Practice Providers on your designated Care Team:   Francis Dowse, [place] "Mardelle Matte" [place], [place] Sherie Don, NP

## 2022-11-18 DIAGNOSIS — M79652 Pain in left thigh: Secondary | ICD-10-CM | POA: Diagnosis not present

## 2022-11-19 ENCOUNTER — Telehealth: Payer: Self-pay | Admitting: Radiation Oncology

## 2022-11-19 NOTE — Telephone Encounter (Signed)
Called patient to schedule a consultation w. Dr. Kathrynn Running. No answer, LVM for a return a call.

## 2022-11-25 DIAGNOSIS — S86112D Strain of other muscle(s) and tendon(s) of posterior muscle group at lower leg level, left leg, subsequent encounter: Secondary | ICD-10-CM | POA: Diagnosis not present

## 2022-11-25 NOTE — Progress Notes (Addendum)
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 Heloise Purpura NM PET (PSMA) Skull to Mid Thigh CLINICAL DATA:  Prostate cancer post prostatectomy and lymph node dissection. Radiation therapy. Elevated PSA. PSA equal 2.97.  FINDINGS: NECK No radiotracer activity in neck lymph nodes.   Incidental CT finding: None.   CHEST Small nodule in the superior segment of the RIGHT lower lobe measures 7 mm on image 95. Second RIGHT upper lobe nodule on measures 8 mm image 84. No radiotracer activity associated with these pulmonary nodules. The RIGHT lower lobe nodule was present on CT 12/27/2019 measuring 3 mm. upper lobe nodule not seen on CT from 2021.   There is fairly intense radiotracer activity associated with a subcarinal lymph node measuring 18 mm short axis with SUV max equal 6.0 on image 100.   Enlarged RIGHT lower paratracheal lymph node measuring 20 mm also has significant radiotracer activity with SUV max equal 4.5.   No supraclavicular radiotracer avid lymph nodes.   Incidental CT finding: None.   ABDOMEN/PELVIS Prostate: Radiotracer activity in the prostatectomy bed.   Lymph nodes: No abnormal radiotracer accumulation within pelvic or abdominal nodes.   Liver: No evidence of liver metastasis.   Incidental CT finding: Gallstones noted. Low-density liver suggests hepatic steatosis.   SKELETON Sclerotic lesion in the L4 vertebral body measuring 2.7 cm is present on CT scan 2021. Radiotracer activity.  However there is focal radiotracer activity in the posterior neuro  large of the C7 vertebral body with SUV max equal 5.2 on image 66.   IMPRESSION: 1. Radiotracer avid enlarged subcarinal and RIGHT lower paratracheal lymph nodes are concerning for prostate cancer nodal metastasis. 2. Radiotracer avid in the C7 vertebral body is concerning for prostate cancer skeletal metastasis. 3. No evidence of local recurrence in the prostatectomy bed. 4. Two small pulmonary nodules in the RIGHT lung are not radiotracer avid but concerning as are enlarged from comparison CT 2021. 5. No radiotracer activity associated with large sclerotic lesion in the L4 vertebral body.    09/04/2019 Dr. Heloise Purpura CT Abdomen and Pelvis with Contrast Clinical Data:  History of prostate cancer status post prostatectomy in 2012.  History of radiation and hormone therapy.  Increasing PSA.    Past/Anticipated chemotherapy by medical oncology, if any: NA  Pain on a scale of 0-10 is: 3/10 left knee    If Spine Met(s), symptoms, if any, include: Bowel/Bladder retention or incontinence (please describe): Bladder leakage and denies bowel issues. Numbness or weakness in extremities (please describe): No Current Decadron regimen, if applicable: No   Ambulatory status? Walker? Wheelchair?: Independent   SAFETY ISSUES: Prior radiation? Yes, prostate (2012 and 2021) Pacemaker/ICD? Yes Possible current pregnancy? Male Is the patient on methotrexate? No  Current Complaints / other details:  is taking Methylprednisolone will finish up tomorrow 11/30/2022.  (6 days)

## 2022-11-26 LAB — CUP PACEART REMOTE DEVICE CHECK
Battery Voltage: 70
Date Time Interrogation Session: 20240628122355
Implantable Lead Connection Status: 753985
Implantable Lead Connection Status: 753985
Implantable Lead Implant Date: 20201221
Implantable Lead Implant Date: 20201221
Implantable Lead Location: 753859
Implantable Lead Location: 753860
Implantable Lead Model: 377169
Implantable Lead Model: 377171
Implantable Lead Serial Number: 7000011949
Implantable Lead Serial Number: 81083606
Implantable Pulse Generator Implant Date: 20201221
Pulse Gen Model: 407145
Pulse Gen Serial Number: 69703841

## 2022-11-29 ENCOUNTER — Ambulatory Visit
Admission: RE | Admit: 2022-11-29 | Discharge: 2022-11-29 | Disposition: A | Discharge status: Home / Self Care | Payer: Medicare PPO | Source: Ambulatory Visit | Attending: Radiation Oncology | Admitting: Radiation Oncology

## 2022-11-29 ENCOUNTER — Encounter: Payer: Self-pay | Admitting: Radiation Oncology

## 2022-11-29 ENCOUNTER — Other Ambulatory Visit: Payer: Self-pay

## 2022-11-29 VITALS — BP 132/86 | HR 69 | Temp 97.1°F | Resp 18 | Ht 71.0 in | Wt 250.5 lb

## 2022-11-29 DIAGNOSIS — C61 Malignant neoplasm of prostate: Secondary | ICD-10-CM | POA: Diagnosis not present

## 2022-11-29 DIAGNOSIS — Z192 Hormone resistant malignancy status: Secondary | ICD-10-CM | POA: Diagnosis not present

## 2022-11-29 DIAGNOSIS — C7951 Secondary malignant neoplasm of bone: Secondary | ICD-10-CM | POA: Diagnosis not present

## 2022-11-29 NOTE — Progress Notes (Signed)
Radiation Oncology         [phone] ________________________________  Outpatient Consultation  Name: Blake Carter MRN: [id number]  Date of Service: 11/29/2022 DOB: 10-30-1950  WU:[id number], [facility], Konrad Dolores, MD   REFERRING PHYSICIAN: Heloise Purpura, MD  DIAGNOSIS: 72 y.o. man with rising PSA and new PSMA-avid disease to subcarinal and right paratracheal lymph nodes and C7    ICD-10-CM   1. Secondary malignant neoplasm of bone and bone marrow (HCC)  C79.51    C79.52     2. Prostate cancer (HCC)  C61     3. Malignant neoplasm metastatic to bone [facility])  C79.51       HISTORY OF PRESENT ILLNESS: Blake Carter is a 72 y.o. male seen at the request of Dr. Laverle Patter. We last saw the patient in 09/2019. In summary, he was diagnosed with Gleason 3+4 prostate cancer in 2011. He proceeded with RALP with BPLND on 06/08/10 under Dr, Laverle Patter, with final pathology showing stage [id number] Gleason 4+3 prostate cancer with involvement of multiple margins, an apical margin mass, and one of 6 lymph nodes with extracapsular extension. Given the high risk features and his detectable postoperative PSA, he was treated with adjuvant radiation therapy under Dr. Dayton Scrape from May to June 2012, concurrent with 2 years of ADT. His PSA became undetectable on ADT and remained so until 07/2015. It continued to rise and then jumped to 7.96 by 03/2017. He was restarted on ADT, and his restaging studies in 03/2017 remained negative for metastatic disease. Despite ADT, his PSA continued to rise to 3.06 by 07/2019, and restaging CT and bone scans on 09/04/19 revealed bony metastatic disease to L4. He was referred to me on 09/18/19 and was subsequently treated with palliative SBRT to the disease at L4.  Since that time, his PSA initially responded but has again started to rise, most recently reaching 2.97. He underwent PSMA PET scan on 11/08/22 showing: radiotracer-avid metastases within enlarging  subcarinal and right lower paratracheal lymph nodes, as well as C7 vertebral body; no evidence of local recurrence in prostatectomy bed; enlarging but not radio-tracer avid small pulmonary nodules in right lung.  PREVIOUS RADIATION THERAPY: Yes  10/02/19 - 10/15/19: L4 lesion / 50 Gy in 5 fractions  09/2010 - 10/2010: Prostate Fossa and pelvic nodes (Dr. Dayton Scrape)  PAST MEDICAL HISTORY:  Past Medical History:  Diagnosis Date   Allergic rhinitis    Aortic atherosclerosis (HCC)    Arthritis    "a little bit in some of my joints" (06/20/2014)   Ascending aorta dilatation (HCC)    43mm by chest CTA 05/2020 and 42 mm by chest MRI MRA 05/2021   Chronic diastolic CHF (congestive heart failure) (HCC)    Coronary artery disease    a. 05/2014 Cath/PCI: LM nl, LAD 40p, 60-70d (FFR 0.80->3.0x24 Synergy DES), D2 40, LCX 90d (2.25x12 Synergy DES), RCA 20-49m, EF 60-65%. b. Cath 02/2017 without acute change, elev LVEDP suggestive of dCHF.   Depression    Dilated aortic root (HCC)    aortic root at 44mm by Chest CTA 11/2019   ED (erectile dysfunction)    Environmental allergies    dry cough   GERD (gastroesophageal reflux disease)    History of hiatal hernia    Hydronephrosis of right kidney    a. s/p ureteral stenting in the past.   Hyperlipidemia    under control   Hypertension    Hypothyroidism    OCD (obsessive compulsive disorder)  Pacemaker    PAF (paroxysmal atrial fibrillation) (HCC)    Panic disorder    PAT (paroxysmal atrial tachycardia)    PONV (postoperative nausea and vomiting)    after thyroid surgery no problems in 10 years    Prostate cancer (HCC)    prostate cancer- robotic prostatectomy - followed by radiation because of positive lymph node--and pt on lupron injections   PVC's (premature ventricular contractions)    Sinus bradycardia    a. chronic, asymptomatic.  24 hour Holter 10/2016 showed Sinus bradycardia, sinus arrhythmia and normal sinus rhythm with average heart rate 50bpm  and heart rate ranged from 32 to 100bpm.   SUI (stress urinary incontinence), male    S/P ROBOTIC PROSTATECTOMY AND RADIATION TX      PAST SURGICAL HISTORY: Past Surgical History:  Procedure Laterality Date   COLONOSCOPY  07/2013   Dr. Randa Evens   CORONARY ANGIOPLASTY WITH STENT PLACEMENT  06/20/2014   "2"   CORONARY PRESSURE/FFR STUDY N/A 07/08/2020   Procedure: INTRAVASCULAR PRESSURE WIRE/FFR STUDY;  Surgeon: Lyn Records, MD;  Location: [facility] INVASIVE CV LAB;  Service: Cardiovascular;  Laterality: N/A;   CYSTOSCOPY  11/30/2011   Procedure: CYSTOSCOPY;  Surgeon: Martina Sinner, MD;  Location: [facility] ORS;  Service: Urology;  Laterality: N/A;  Inplantation of Artificial Sphincter and Cystoscopy   CYSTOSCOPY WITH RETROGRADE PYELOGRAM, URETEROSCOPY AND STENT PLACEMENT Right 02/11/2014   Procedure: CYSTOSCOPY WITH RETROGRADE PYELOGRAM, URETEROSCOPY ,URETERAL BIOPSY AND STENT PLACEMENT;  Surgeon: Heloise Purpura, MD;  Location: [facility] ORS;  Service: Urology;  Laterality: Right;   CYSTOSCOPY WITH RETROGRADE PYELOGRAM, URETEROSCOPY AND STENT PLACEMENT Right 04/15/2014   Procedure: CYSTOSCOPY WITH RETROGRADE PYELOGRAM, URETEROSCOPY, BIOPSY AND STENT PLACEMENT;  Surgeon: Heloise Purpura, MD;  Location: [facility];  Service: Urology;  Laterality: Right;   LEFT HEART CATH AND CORONARY ANGIOGRAPHY N/A 03/29/2017   Procedure: LEFT HEART CATH AND CORONARY ANGIOGRAPHY;  Surgeon: Marykay Lex, MD;  Location: [facility] INVASIVE CV LAB;  Service: Cardiovascular;  Laterality: N/A;   LEFT HEART CATHETERIZATION WITH CORONARY ANGIOGRAM N/A 06/20/2014   Procedure: LEFT HEART CATHETERIZATION WITH CORONARY ANGIOGRAM;  Surgeon: Marykay Lex, MD;  Location: [facility] CATH LAB;  Service: Cardiovascular;  Laterality: N/A;   PACEMAKER IMPLANT N/A 05/21/2019   Procedure: PACEMAKER IMPLANT;  Surgeon: Marinus Maw, MD;  Location: [facility] INVASIVE CV LAB;  Service: Cardiovascular;  Laterality: N/A;   PERCUTANEOUS CORONARY STENT INTERVENTION (PCI-S)   06/20/2014   Procedure: PERCUTANEOUS CORONARY STENT INTERVENTION (PCI-S);  Surgeon: Marykay Lex, MD;  Location: [facility] CATH LAB;  Service: Cardiovascular;;  LAD and Circumflex   REFRACTIVE SURGERY Bilateral 2004   RIGHT/LEFT HEART CATH AND CORONARY ANGIOGRAPHY N/A 07/08/2020   Procedure: RIGHT/LEFT HEART CATH AND CORONARY ANGIOGRAPHY;  Surgeon: Lyn Records, MD;  Location: [facility] INVASIVE CV LAB;  Service: Cardiovascular;  Laterality: N/A;   ROBOT ASSISTED LAPAROSCOPIC RADICAL PROSTATECTOMY  05/2010   THYROIDECTOMY, PARTIAL  10/1974   TONSILLECTOMY  1956    TOTAL THYROIDECTOMY  10/2012   "nuked it"   UPPER GI ENDOSCOPY     food impaction 2009 done by Dr Randa Evens   URINARY SPHINCTER IMPLANT  11/30/2011   Procedure: ARTIFICIAL URINARY SPHINCTER;  Surgeon: Martina Sinner, MD;  Location: [facility];  Service: Urology;  Laterality: N/A;    FAMILY HISTORY:  Family History  Problem Relation Age of Onset   Cancer Father    Hyperlipidemia Father    Heart disease Father        Father  had CABG/aorta repair by the time he was in his 13s   CAD Brother        Brother who is 10 years younger has had some sort of stenting    SOCIAL HISTORY:  Social History   Socioeconomic History   Marital status: Married    Spouse name: Not on file   Number of children: Not on file   Years of education: Not on file   Highest education level: Not on file  Occupational History   Not on file  Tobacco Use   Smoking status: Never   Smokeless tobacco: Never  Vaping Use   Vaping Use: Never used  Substance and Sexual Activity   Alcohol use: Yes    Alcohol/week: 0.0 standard drinks of alcohol    Comment: 06/20/2014 "1-2 drinks a couple times/month"   Drug use: No   Sexual activity: Yes  Other Topics Concern   Not on file  Social History Narrative   Not on file   Social Determinants of Health   Financial Resource Strain: Not on file  Food Insecurity: No Food Insecurity (11/29/2022)   Hunger Vital Sign    Worried  About Running Out of Food in the Last Year: Never true    Ran Out of Food in the Last Year: Never true  Transportation Needs: No Transportation Needs (11/29/2022)   PRAPARE - Administrator, Civil Service (Medical): No    Lack of Transportation (Non-Medical): No  Physical Activity: Not on file  Stress: Not on file  Social Connections: Not on file  Intimate Partner Violence: Not At Risk (11/29/2022)   Humiliation, Afraid, Rape, and Kick questionnaire    Fear of Current or Ex-Partner: No    Emotionally Abused: No    Physically Abused: No    Sexually Abused: No    ALLERGIES: Cimetidine, Atorvastatin, Hydrocodone bit-homatrop mbr, and Niacin and related  MEDICATIONS:  Current Outpatient Medications  Medication Sig Dispense Refill   methylPREDNISolone (MEDROL DOSEPAK) 4 MG TBPK tablet TAKE 6 TABLETS ON DAY 1 AS DIRECTED ON PACKAGE AND DECREASE BY 1 TAB EACH DAY FOR A TOTAL OF 6 DAYS     acetaminophen (TYLENOL) 500 MG tablet Take 1,000 mg by mouth every 6 (six) hours as needed (pain).     ALPRAZolam (XANAX) 0.25 MG tablet Take 1 tablet (0.25 mg total) by mouth 2 (two) times daily as needed for anxiety. 60 tablet 2   amLODipine (NORVASC) 5 MG tablet Take 1 tablet (5 mg total) by mouth daily. 90 tablet 3   apixaban (ELIQUIS) 5 MG TABS tablet Take 1 tablet (5 mg total) by mouth 2 (two) times daily. 180 tablet 3   fenofibrate (TRICOR) 145 MG tablet Take 1 tablet (145 mg total) by mouth daily. 90 tablet 3   fluvoxaMINE (LUVOX) 100 MG tablet TAKE 1 TABLET BY MOUTH TWICE A DAY 180 tablet 3   furosemide (LASIX) 20 MG tablet TAKE 1 TABLET BY MOUTH  DAILY AS NEEDED FOR FLUID OR EDEMA 90 tablet 3   latanoprost (XALATAN) 0.005 % ophthalmic solution Place 1 drop into both eyes at bedtime.      leuprolide (LUPRON DEPOT, 57-MONTH,) 30 MG injection Inject 30 mg into the muscle every 6 (six) months.     levothyroxine (SYNTHROID) 137 MCG tablet Take 137 mcg by mouth every morning.     lisinopril  (ZESTRIL) 40 MG tablet Take 1 tablet (40 mg total) by mouth daily. 90 tablet 3   metFORMIN (GLUCOPHAGE-XR)  500 MG 24 hr tablet Take 1 tablet by mouth at bedtime.     metoprolol succinate (TOPROL-XL) 100 MG 24 hr tablet Take 1 tablet (100 mg total) by mouth daily. TAKE WITH OR IMMEDIATELY FOLLOWING A MEAL. 90 tablet 3   MYRBETRIQ 50 MG TB24 tablet Take 50 mg by mouth daily.     nitroGLYCERIN (NITROSTAT) 0.4 MG SL tablet Place 1 tablet (0.4 mg total) under the tongue every 5 (five) minutes as needed for chest pain. 25 tablet 4   pantoprazole (PROTONIX) 40 MG tablet Take 2 tablets (80 mg total) by mouth daily. 180 tablet 3   rosuvastatin (CRESTOR) 40 MG tablet Take 1 tablet (40 mg total) by mouth daily. 90 tablet 3   No current facility-administered medications for this encounter.    REVIEW OF SYSTEMS:  On review of systems, the patient reports that he is doing well overall. He denies any chest pain, shortness of breath, cough, fevers, chills, night sweats, unintended weight changes. He denies any bowel or bladder disturbances, and denies abdominal pain, nausea or vomiting. He reports his only urinary complaint is some bladder leakage. He denies any new musculoskeletal or joint aches or pains. A complete review of systems is obtained and is otherwise negative.    PHYSICAL EXAM:  Wt Readings from Last 3 Encounters:  11/29/22 250 lb 8 oz (113.6 kg)  11/16/22 252 lb 3.2 oz (114.4 kg)  02/15/22 258 lb (117 kg)   Temp Readings from Last 3 Encounters:  11/29/22 (!) 97.1 F (36.2 C) (Temporal)  07/23/20 (!) 97.1 F (36.2 C)  07/08/20 98.9 F (37.2 C) (Oral)   BP Readings from Last 3 Encounters:  11/29/22 132/86  11/16/22 102/66  02/15/22 114/68   Pulse Readings from Last 3 Encounters:  11/29/22 69  11/16/22 77  02/15/22 69   Pain Assessment Pain Score: 3  Pain Loc: Knee (left)/10  In general this is a well appearing man in no acute distress. He's alert and oriented x4 and appropriate  throughout the examination. Cardiopulmonary assessment is negative for acute distress and he exhibits normal effort.     KPS = 100  100 - Normal; no complaints; no evidence of disease. 90   - Able to carry on normal activity; minor signs or symptoms of disease. 80   - Normal activity with effort; some signs or symptoms of disease. 5   - Cares for self; unable to carry on normal activity or to do active work. 60   - Requires occasional assistance, but is able to care for most of his personal needs. 50   - Requires considerable assistance and frequent medical care. 40   - Disabled; requires special care and assistance. 30   - Severely disabled; hospital admission is indicated although death not imminent. 20   - Very sick; hospital admission necessary; active supportive treatment necessary. 10   - Moribund; fatal processes progressing rapidly. 0     - Dead  Karnofsky DA, Abelmann WH, Craver LS and Burchenal [facility] [phone]) The use of the nitrogen mustards in the palliative treatment of carcinoma: with particular reference to bronchogenic carcinoma Cancer 1 634-56  LABORATORY DATA:  Lab Results  Component Value Date   WBC 4.4 08/11/2021   HGB 11.5 (L) 08/11/2021   HCT 34.5 (L) 08/11/2021   MCV 86 08/11/2021   PLT 243 08/11/2021   Lab Results  Component Value Date   NA 143 08/11/2021   K 4.3 08/11/2021   CL 107 (H)  08/11/2021   CO2 22 08/11/2021   Lab Results  Component Value Date   ALT 19 02/09/2022   AST 20 02/09/2022   ALKPHOS 55 02/09/2022   BILITOT 0.2 02/09/2022     RADIOGRAPHY: CUP PACEART REMOTE DEVICE CHECK  Result Date: 11/26/2022 Scheduled remote reviewed. Normal device function.  1 AF episodes, 56sec in duration, burden 0%, Eliquis per EPIC Next remote 91 days. LA, CVRS  NM PET (PSMA) SKULL TO MID THIGH  Result Date: 11/11/2022 CLINICAL DATA:  Prostate cancer post prostatectomy and lymph node dissection. Radiation therapy. Elevated PSA. PSA equal 2.97. EXAM:  NUCLEAR MEDICINE PET SKULL BASE TO THIGH TECHNIQUE: 9.67 mCi F18 Piflufolastat (Pylarify) was injected intravenously. Full-ring PET imaging was performed from the skull base to thigh after the radiotracer. CT data was obtained and used for attenuation correction and anatomic localization. COMPARISON:  CT chest 12/27/2019. FINDINGS: NECK No radiotracer activity in neck lymph nodes. Incidental CT finding: None. CHEST Small nodule in the superior segment of the RIGHT lower lobe measures 7 mm on image 95. Second RIGHT upper lobe nodule on measures 8 mm image 84. No radiotracer activity associated with these pulmonary nodules. The RIGHT lower lobe nodule was present on CT 12/27/2019 measuring 3 mm. upper lobe nodule not seen on CT from 2021. There is fairly intense radiotracer activity associated with a subcarinal lymph node measuring 18 mm short axis with SUV max equal 6.0 on image 100. Enlarged RIGHT lower paratracheal lymph node measuring 20 mm also has significant radiotracer activity with SUV max equal 4.5. No supraclavicular radiotracer avid lymph nodes. Incidental CT finding: None. ABDOMEN/PELVIS Prostate: Radiotracer activity in the prostatectomy bed. Lymph nodes: No abnormal radiotracer accumulation within pelvic or abdominal nodes. Liver: No evidence of liver metastasis. Incidental CT finding: Gallstones noted. Low-density liver suggests hepatic steatosis. SKELETON Sclerotic lesion in the L4 vertebral body measuring 2.7 cm is present on CT scan 2021. Radiotracer activity. However there is focal radiotracer activity in the posterior neuro large of the C7 vertebral body with SUV max equal 5.2 on image 66. IMPRESSION: 1. Radiotracer avid enlarged subcarinal and RIGHT lower paratracheal lymph nodes are concerning for prostate cancer nodal metastasis. 2. Radiotracer avid in the C7 vertebral body is concerning for prostate cancer skeletal metastasis. 3. No evidence of local recurrence in the prostatectomy bed. 4. Two  small pulmonary nodules in the RIGHT lung are not radiotracer avid but concerning as are enlarged from comparison CT 2021. 5. No radiotracer activity associated with large sclerotic lesion in the L4 vertebral body. Electronically Signed   By: Genevive Bi M.D.   On: 11/11/2022 16:45      IMPRESSION/PLAN: 1. 72 y.o. man with rising PSA and new PSMA-avid disease to subcarinal and right paratracheal lymph nodes and C7  It was a pleasure seeing this patient again today. Most recent PSMA PET showed hypermetabolic subcarinal and right paratracheal lymph nodes along with a C7 lesion. Patient is fortunately asymptomatic at this time. Radiation is recommended to decrease the risk of disease progression.   Notably, the PET also showed enlarging pulmonary nodules from previous imaging. These nodules remain relatively small, but we recommend 6 month follow up with imaging.   Today, we talked to the patient and family about the findings and workup thus far. We discussed the natural history of metastatic prostatic carcinoma and general treatment, highlighting the role of radiotherapy in the management. We discussed the available radiation techniques, and focused on the details and logistics of delivery.  We reviewed the anticipated acute and late sequelae associated with radiation in this setting. The patient was encouraged to ask questions that were answered to his satisfaction. He expressed understanding of the treatment which is of curative intent.   At the end of our conversation, the patient is interested in proceeding with radiation treatment. He is scheduled for CT simulation on 12/01/22. We will plan to deliver radiation to the C7 lesion along with the subcarinal node, right paratracheal lymph node, and their surrounding lymph nodes. We again look forward to participating in this patient's care.     I personally spent 60 minutes in this encounter including chart review, reviewing radiological studies,  meeting face-to-face with the patient, entering orders and completing documentation.     Joyice Faster, PA-C    Margaretmary Dys, MD  [facility]  Radiation Oncology Direct Dial: [phone]  Fax: [phone] [email or web address]  Skype  [email or web address]   This document serves as a record of services personally performed by Margaretmary Dys, MD and Joyice Faster, PA-C. It was created on their behalf by Mickie Bail, a trained medical scribe. The creation of this record is based on the scribe's personal observations and the provider's statements to them. This document has been checked and approved by the attending provider.

## 2022-11-30 NOTE — Addendum Note (Signed)
Encounter addended by: Roel Cluck, RN on: 11/30/2022 9:12 AM  Actions taken: Clinical Note Signed

## 2022-12-01 ENCOUNTER — Ambulatory Visit
Admission: RE | Admit: 2022-12-01 | Discharge: 2022-12-01 | Disposition: A | Discharge status: Home / Self Care | Payer: Medicare PPO | Source: Ambulatory Visit | Attending: Radiation Oncology | Admitting: Radiation Oncology

## 2022-12-01 ENCOUNTER — Other Ambulatory Visit: Payer: Self-pay

## 2022-12-01 ENCOUNTER — Encounter: Payer: Self-pay | Admitting: Internal Medicine

## 2022-12-01 DIAGNOSIS — C61 Malignant neoplasm of prostate: Secondary | ICD-10-CM | POA: Diagnosis not present

## 2022-12-01 DIAGNOSIS — Z191 Hormone sensitive malignancy status: Secondary | ICD-10-CM | POA: Diagnosis not present

## 2022-12-01 DIAGNOSIS — Z510 Encounter for antineoplastic radiation therapy: Secondary | ICD-10-CM | POA: Diagnosis not present

## 2022-12-01 DIAGNOSIS — C7951 Secondary malignant neoplasm of bone: Secondary | ICD-10-CM | POA: Diagnosis not present

## 2022-12-01 DIAGNOSIS — C778 Secondary and unspecified malignant neoplasm of lymph nodes of multiple regions: Secondary | ICD-10-CM | POA: Diagnosis not present

## 2022-12-01 NOTE — Progress Notes (Unsigned)
TO BE COMPLETED BY RADIATION ONCOLOGIST OFFICE:   Patient Name: Blake Carter   Date of Birth: 1951-02-18   Radiation Oncologist: Dr. Margaretmary Dys   Site to be Treated: C spine & chest   Will x-rays >10 MV be used? No   Will the radiation be >10 cm from the device? Yes   Planned Treatment Start Date: 1 week  TO BE COMPLETED BY CARDIOLOGIST OFFICE:   Device Information:  Pacemaker [x]      ICD []    Brand: Biotronik: [id number] Model #: Edora 8 DR-T   Serial Number: [id number]     Date of Placement: 05/21/2019  Site of Placement: L. Chest   Remote Device Check--Frequency: 91 days    Last Check: 11/26/22  Is the Patient Pacer Dependent?:  Yes [x]   No []   Does cardiologist request Radiation Oncology to schedule device testing by vendor for the following:  Prior to the Initiation of Treatments?  Yes []  No [x]  During Treatments?  Yes []  No [x]  Post Radiation Treatments?  Yes []  No [x]   Is device monitoring necessary by vendor/cardiologist team during treatments?  Yes []   No [x]   Is cardiac monitoring by Radiation Oncology nursing necessary during treatments? Yes [x]   No []   Do you recommend device be relocated prior to Radiation Treatment? Yes []   No [x]   **PLEASE LIST ANY NOTES OR SPECIAL REQUESTS:       CARDIOLOGIST SIGNATURE:  Dr. Lewayne Bunting Per Device Clinic Standing Orders, Dorathy Daft  12/01/2022 9:42 AM  **Please route completed form back to Radiation Oncology Nursing and "P [facility] RAD ONC ADMIN", OR send an update if there will be a delay in having form completed by expected start date.  **Call [phone] if you have any questions or do not get an in-basket response from a Radiation Oncology staff member

## 2022-12-02 NOTE — Progress Notes (Signed)
  Radiation Oncology         [phone] ________________________________  Name: Blake Carter MRN: [id number]  Date: 12/01/2022  DOB: 02-Dec-1950  ULTRAHYPOFRACTIONATED RADIOTHERAPY  SIMULATION AND TREATMENT PLANNING NOTE    ICD-10-CM   1. Prostate cancer (HCC)  C61     2. Malignant neoplasm metastatic to bone [facility])  C79.51       DIAGNOSIS:  72 y.o. man with rising PSA and new PSMA-avid disease to subcarinal and right paratracheal lymph nodes and C7  NARRATIVE:  The patient was brought to the CT Simulation planning suite.  Identity was confirmed.  All relevant records and images related to the planned course of therapy were reviewed.  The patient freely provided informed written consent to proceed with treatment after reviewing the details related to the planned course of therapy. The consent form was witnessed and verified by the simulation staff.  Then, the patient was set-up in a stable reproducible  supine position for radiation therapy.  A BodyFix immobilization pillow was fabricated for reproducible positioning.  Surface markings were placed.  The CT images were loaded into the planning software.  The gross target volumes (GTV) and planning target volumes (PTV) were delinieated, and avoidance structures were contoured.  Treatment planning then occurred.  The radiation prescription was entered and confirmed.  A total of two complex treatment devices were fabricated in the form of the BodyFix immobilization pillow and a neck accuform cushion.  I have requested : 3D Simulation  I have requested a DVH of the following structures: targets and all normal structures near the target including spinal cord, lungs, heart, skin and others as noted on the radiation plan to maintain doses in adherence with established limits  SPECIAL TREATMENT PROCEDURE:  The planned course of therapy using radiation constitutes a special treatment procedure. Special care is required in the management of this patient  for the following reasons. High dose per fraction requiring special monitoring for increased toxicities of treatment including daily imaging..  The special nature of the planned course of radiotherapy will require increased physician supervision and oversight to ensure patient's safety with optimal treatment outcomes.    This requires extended time and effort.    PLAN:  The patient will receive 40 Gy in 5 fractions to the C7 met and 50 Gy in 10 fractions to the PET avid mediastinal nodes and 30 Gy to the mediastinum.  ________________________________  Artist Pais Kathrynn Running, M.D.

## 2022-12-03 DIAGNOSIS — S86112D Strain of other muscle(s) and tendon(s) of posterior muscle group at lower leg level, left leg, subsequent encounter: Secondary | ICD-10-CM | POA: Diagnosis not present

## 2022-12-04 ENCOUNTER — Other Ambulatory Visit: Payer: Self-pay | Admitting: Nurse Practitioner

## 2022-12-07 NOTE — Progress Notes (Signed)
Remote pacemaker transmission.   

## 2022-12-08 DIAGNOSIS — M25562 Pain in left knee: Secondary | ICD-10-CM | POA: Diagnosis not present

## 2022-12-08 DIAGNOSIS — C61 Malignant neoplasm of prostate: Secondary | ICD-10-CM | POA: Diagnosis not present

## 2022-12-08 DIAGNOSIS — C7951 Secondary malignant neoplasm of bone: Secondary | ICD-10-CM | POA: Diagnosis not present

## 2022-12-10 DIAGNOSIS — Z510 Encounter for antineoplastic radiation therapy: Secondary | ICD-10-CM | POA: Diagnosis not present

## 2022-12-10 DIAGNOSIS — Z191 Hormone sensitive malignancy status: Secondary | ICD-10-CM | POA: Diagnosis not present

## 2022-12-10 DIAGNOSIS — C778 Secondary and unspecified malignant neoplasm of lymph nodes of multiple regions: Secondary | ICD-10-CM | POA: Diagnosis not present

## 2022-12-10 DIAGNOSIS — C7951 Secondary malignant neoplasm of bone: Secondary | ICD-10-CM | POA: Diagnosis not present

## 2022-12-10 DIAGNOSIS — C61 Malignant neoplasm of prostate: Secondary | ICD-10-CM | POA: Diagnosis not present

## 2022-12-11 DIAGNOSIS — Z191 Hormone sensitive malignancy status: Secondary | ICD-10-CM | POA: Diagnosis not present

## 2022-12-11 DIAGNOSIS — C7951 Secondary malignant neoplasm of bone: Secondary | ICD-10-CM | POA: Diagnosis not present

## 2022-12-11 DIAGNOSIS — C61 Malignant neoplasm of prostate: Secondary | ICD-10-CM | POA: Diagnosis not present

## 2022-12-13 ENCOUNTER — Encounter: Payer: Self-pay | Admitting: Internal Medicine

## 2022-12-13 NOTE — Progress Notes (Signed)
TO BE COMPLETED BY RADIATION ONCOLOGIST OFFICE:   Patient Name: Blake Carter   Date of Birth: April 04, 1951   Radiation Oncologist: Dr. Margaretmary Dys   Site to be Treated: Chest  Will x-rays >10 MV be used? No  Will the radiation be >10 cm from the device? No, 7.6cm from the pacemaker  Planned Treatment Start Date: Now  TO BE COMPLETED BY CARDIOLOGIST OFFICE:   Device Information:  Pacemaker [x]      ICD []    Brand: Biotronik: [id number] Model #: Biotronik [id number] Edora 8 DR-T  Serial Number: [id number] Date of Placement: 05/21/2019  Site of Placement: Chest  Remote Device Check--Frequency: Every 91 days   Last Check: 11/26/22  Is the Patient Pacer Dependent?:  Yes [x]   No []   Does cardiologist request Radiation Oncology to schedule device testing by vendor for the following:  Prior to the Initiation of Treatments?  Yes []  No [x]  During Treatments?  Yes []  No [x]  Post Radiation Treatments?  Yes [x]  No []  (1 Month after completion per Dr. Ladona Ridgel. Please call the device clinic when completed to arrange. Device Clinic number [phone].)  Is device monitoring necessary by vendor/cardiologist team during treatments?  Yes [x]   No []   Is cardiac monitoring by Radiation Oncology nursing necessary during treatments? Yes [x]   No []   Do you recommend device be relocated prior to Radiation Treatment? Yes []   No [x]   **PLEASE LIST ANY NOTES OR SPECIAL REQUESTS:       CARDIOLOGIST SIGNATURE:  Dr. Lewayne Bunting Per Device Clinic Standing Orders, [name]  12/13/2022 7:36 AM  **Please route completed form back to Radiation Oncology Nursing and "P [facility] RAD ONC ADMIN", OR send an update if there will be a delay in having form completed by expected start date.  **Call [phone] if you have any questions or do not get an in-basket response from a Radiation Oncology staff member

## 2022-12-15 ENCOUNTER — Telehealth: Payer: Self-pay

## 2022-12-15 NOTE — Telephone Encounter (Signed)
Rn received call from pacemaker vendor rep to confirm vendor monitoring during pt treatments. [employer] rep name is Vivien Rota and his number is [phone] if we need to reach him for any questions or concerns.

## 2022-12-16 ENCOUNTER — Other Ambulatory Visit: Payer: Self-pay | Admitting: Adult Health

## 2022-12-16 DIAGNOSIS — M25562 Pain in left knee: Secondary | ICD-10-CM

## 2022-12-20 ENCOUNTER — Ambulatory Visit
Admission: RE | Admit: 2022-12-20 | Discharge: 2022-12-20 | Disposition: A | Discharge status: Home / Self Care | Payer: Medicare PPO | Source: Ambulatory Visit | Attending: Radiation Oncology | Admitting: Radiation Oncology

## 2022-12-20 ENCOUNTER — Other Ambulatory Visit: Payer: Self-pay

## 2022-12-20 ENCOUNTER — Ambulatory Visit: Admission: RE | Admit: 2022-12-20 | Payer: Medicare PPO | Source: Ambulatory Visit | Admitting: Radiation Oncology

## 2022-12-20 DIAGNOSIS — C61 Malignant neoplasm of prostate: Secondary | ICD-10-CM

## 2022-12-20 DIAGNOSIS — C778 Secondary and unspecified malignant neoplasm of lymph nodes of multiple regions: Secondary | ICD-10-CM | POA: Diagnosis not present

## 2022-12-20 DIAGNOSIS — C7951 Secondary malignant neoplasm of bone: Secondary | ICD-10-CM

## 2022-12-20 DIAGNOSIS — Z510 Encounter for antineoplastic radiation therapy: Secondary | ICD-10-CM | POA: Diagnosis not present

## 2022-12-20 DIAGNOSIS — Z191 Hormone sensitive malignancy status: Secondary | ICD-10-CM | POA: Diagnosis not present

## 2022-12-20 LAB — RAD ONC ARIA SESSION SUMMARY
Course Elapsed Days: 0
Plan Fractions Treated to Date: 1
Plan Fractions Treated to Date: 1
Plan Prescribed Dose Per Fraction: 5 Gy
Plan Prescribed Dose Per Fraction: 8 Gy
Plan Total Fractions Prescribed: 10
Plan Total Fractions Prescribed: 5
Plan Total Prescribed Dose: 40 Gy
Plan Total Prescribed Dose: 50 Gy
Reference Point Dosage Given to Date: 5 Gy
Reference Point Dosage Given to Date: 8 Gy
Reference Point Session Dosage Given: 5 Gy
Reference Point Session Dosage Given: 8 Gy
Session Number: 1

## 2022-12-21 ENCOUNTER — Ambulatory Visit
Admission: RE | Admit: 2022-12-21 | Discharge: 2022-12-21 | Disposition: A | Discharge status: Home / Self Care | Payer: Medicare PPO | Source: Ambulatory Visit | Attending: Radiation Oncology | Admitting: Radiation Oncology

## 2022-12-21 ENCOUNTER — Ambulatory Visit: Payer: Medicare PPO

## 2022-12-21 ENCOUNTER — Other Ambulatory Visit: Payer: Self-pay

## 2022-12-21 DIAGNOSIS — Z510 Encounter for antineoplastic radiation therapy: Secondary | ICD-10-CM | POA: Diagnosis not present

## 2022-12-21 DIAGNOSIS — Z191 Hormone sensitive malignancy status: Secondary | ICD-10-CM | POA: Diagnosis not present

## 2022-12-21 DIAGNOSIS — C778 Secondary and unspecified malignant neoplasm of lymph nodes of multiple regions: Secondary | ICD-10-CM | POA: Diagnosis not present

## 2022-12-21 DIAGNOSIS — C61 Malignant neoplasm of prostate: Secondary | ICD-10-CM | POA: Diagnosis not present

## 2022-12-21 DIAGNOSIS — C7951 Secondary malignant neoplasm of bone: Secondary | ICD-10-CM | POA: Diagnosis not present

## 2022-12-21 LAB — RAD ONC ARIA SESSION SUMMARY
Course Elapsed Days: 1
Plan Fractions Treated to Date: 2
Plan Prescribed Dose Per Fraction: 5 Gy
Plan Total Fractions Prescribed: 10
Plan Total Prescribed Dose: 50 Gy
Reference Point Dosage Given to Date: 10 Gy
Reference Point Session Dosage Given: 5 Gy
Session Number: 2

## 2022-12-22 ENCOUNTER — Other Ambulatory Visit: Payer: Self-pay

## 2022-12-22 ENCOUNTER — Ambulatory Visit
Admission: RE | Admit: 2022-12-22 | Discharge: 2022-12-22 | Disposition: A | Discharge status: Home / Self Care | Payer: Medicare PPO | Source: Ambulatory Visit | Attending: Radiation Oncology | Admitting: Radiation Oncology

## 2022-12-22 ENCOUNTER — Ambulatory Visit: Payer: Medicare PPO

## 2022-12-22 DIAGNOSIS — C61 Malignant neoplasm of prostate: Secondary | ICD-10-CM | POA: Diagnosis not present

## 2022-12-22 DIAGNOSIS — C778 Secondary and unspecified malignant neoplasm of lymph nodes of multiple regions: Secondary | ICD-10-CM | POA: Diagnosis not present

## 2022-12-22 DIAGNOSIS — C7951 Secondary malignant neoplasm of bone: Secondary | ICD-10-CM

## 2022-12-22 DIAGNOSIS — Z191 Hormone sensitive malignancy status: Secondary | ICD-10-CM | POA: Diagnosis not present

## 2022-12-22 DIAGNOSIS — Z510 Encounter for antineoplastic radiation therapy: Secondary | ICD-10-CM | POA: Diagnosis not present

## 2022-12-22 LAB — RAD ONC ARIA SESSION SUMMARY
Course Elapsed Days: 2
Plan Fractions Treated to Date: 2
Plan Fractions Treated to Date: 3
Plan Prescribed Dose Per Fraction: 5 Gy
Plan Prescribed Dose Per Fraction: 8 Gy
Plan Total Fractions Prescribed: 10
Plan Total Fractions Prescribed: 5
Plan Total Prescribed Dose: 40 Gy
Plan Total Prescribed Dose: 50 Gy
Reference Point Dosage Given to Date: 15 Gy
Reference Point Dosage Given to Date: 16 Gy
Reference Point Session Dosage Given: 5 Gy
Reference Point Session Dosage Given: 8 Gy
Session Number: 3

## 2022-12-23 ENCOUNTER — Other Ambulatory Visit (HOSPITAL_COMMUNITY): Payer: Self-pay | Admitting: Adult Health

## 2022-12-23 ENCOUNTER — Ambulatory Visit
Admission: RE | Admit: 2022-12-23 | Discharge: 2022-12-23 | Disposition: A | Discharge status: Home / Self Care | Payer: Medicare PPO | Source: Ambulatory Visit | Attending: Radiation Oncology | Admitting: Radiation Oncology

## 2022-12-23 ENCOUNTER — Ambulatory Visit: Payer: Medicare PPO

## 2022-12-23 ENCOUNTER — Other Ambulatory Visit: Payer: Self-pay

## 2022-12-23 ENCOUNTER — Other Ambulatory Visit (HOSPITAL_COMMUNITY): Payer: Self-pay | Admitting: Family Medicine

## 2022-12-23 DIAGNOSIS — C7951 Secondary malignant neoplasm of bone: Secondary | ICD-10-CM | POA: Diagnosis not present

## 2022-12-23 DIAGNOSIS — C61 Malignant neoplasm of prostate: Secondary | ICD-10-CM | POA: Diagnosis not present

## 2022-12-23 DIAGNOSIS — Z191 Hormone sensitive malignancy status: Secondary | ICD-10-CM | POA: Diagnosis not present

## 2022-12-23 DIAGNOSIS — M79652 Pain in left thigh: Secondary | ICD-10-CM

## 2022-12-23 DIAGNOSIS — C778 Secondary and unspecified malignant neoplasm of lymph nodes of multiple regions: Secondary | ICD-10-CM | POA: Diagnosis not present

## 2022-12-23 DIAGNOSIS — M25562 Pain in left knee: Secondary | ICD-10-CM

## 2022-12-23 DIAGNOSIS — Z510 Encounter for antineoplastic radiation therapy: Secondary | ICD-10-CM | POA: Diagnosis not present

## 2022-12-23 LAB — RAD ONC ARIA SESSION SUMMARY
Course Elapsed Days: 3
Plan Fractions Treated to Date: 4
Plan Prescribed Dose Per Fraction: 5 Gy
Plan Total Fractions Prescribed: 10
Plan Total Prescribed Dose: 50 Gy
Reference Point Dosage Given to Date: 20 Gy
Reference Point Session Dosage Given: 5 Gy
Session Number: 4

## 2022-12-24 ENCOUNTER — Ambulatory Visit
Admission: RE | Admit: 2022-12-24 | Discharge: 2022-12-24 | Disposition: A | Discharge status: Home / Self Care | Payer: Medicare PPO | Source: Ambulatory Visit | Attending: Radiation Oncology | Admitting: Radiation Oncology

## 2022-12-24 ENCOUNTER — Other Ambulatory Visit: Payer: Self-pay

## 2022-12-24 ENCOUNTER — Ambulatory Visit: Payer: Medicare PPO

## 2022-12-24 DIAGNOSIS — C7951 Secondary malignant neoplasm of bone: Secondary | ICD-10-CM

## 2022-12-24 DIAGNOSIS — C61 Malignant neoplasm of prostate: Secondary | ICD-10-CM | POA: Diagnosis not present

## 2022-12-24 DIAGNOSIS — Z510 Encounter for antineoplastic radiation therapy: Secondary | ICD-10-CM | POA: Diagnosis not present

## 2022-12-24 DIAGNOSIS — Z191 Hormone sensitive malignancy status: Secondary | ICD-10-CM | POA: Diagnosis not present

## 2022-12-24 DIAGNOSIS — C778 Secondary and unspecified malignant neoplasm of lymph nodes of multiple regions: Secondary | ICD-10-CM | POA: Diagnosis not present

## 2022-12-24 LAB — RAD ONC ARIA SESSION SUMMARY
Course Elapsed Days: 4
Plan Fractions Treated to Date: 3
Plan Fractions Treated to Date: 5
Plan Prescribed Dose Per Fraction: 5 Gy
Plan Prescribed Dose Per Fraction: 8 Gy
Plan Total Fractions Prescribed: 10
Plan Total Fractions Prescribed: 5
Plan Total Prescribed Dose: 40 Gy
Plan Total Prescribed Dose: 50 Gy
Reference Point Dosage Given to Date: 24 Gy
Reference Point Dosage Given to Date: 25 Gy
Reference Point Session Dosage Given: 5 Gy
Reference Point Session Dosage Given: 8 Gy
Session Number: 5

## 2022-12-27 ENCOUNTER — Ambulatory Visit
Admission: RE | Admit: 2022-12-27 | Discharge: 2022-12-27 | Disposition: A | Discharge status: Home / Self Care | Payer: Medicare PPO | Source: Ambulatory Visit | Attending: Radiation Oncology | Admitting: Radiation Oncology

## 2022-12-27 ENCOUNTER — Other Ambulatory Visit: Payer: Self-pay

## 2022-12-27 DIAGNOSIS — C61 Malignant neoplasm of prostate: Secondary | ICD-10-CM | POA: Diagnosis not present

## 2022-12-27 DIAGNOSIS — C7951 Secondary malignant neoplasm of bone: Secondary | ICD-10-CM | POA: Diagnosis not present

## 2022-12-27 DIAGNOSIS — Z191 Hormone sensitive malignancy status: Secondary | ICD-10-CM | POA: Diagnosis not present

## 2022-12-27 DIAGNOSIS — Z510 Encounter for antineoplastic radiation therapy: Secondary | ICD-10-CM | POA: Diagnosis not present

## 2022-12-27 DIAGNOSIS — C778 Secondary and unspecified malignant neoplasm of lymph nodes of multiple regions: Secondary | ICD-10-CM | POA: Diagnosis not present

## 2022-12-27 LAB — RAD ONC ARIA SESSION SUMMARY
Course Elapsed Days: 7
Plan Fractions Treated to Date: 6
Plan Prescribed Dose Per Fraction: 5 Gy
Plan Total Fractions Prescribed: 10
Plan Total Prescribed Dose: 50 Gy
Reference Point Dosage Given to Date: 30 Gy
Reference Point Session Dosage Given: 5 Gy
Session Number: 6

## 2022-12-28 ENCOUNTER — Other Ambulatory Visit: Payer: Self-pay

## 2022-12-28 ENCOUNTER — Ambulatory Visit
Admission: RE | Admit: 2022-12-28 | Discharge: 2022-12-28 | Disposition: A | Discharge status: Home / Self Care | Payer: Medicare PPO | Source: Ambulatory Visit | Attending: Radiation Oncology | Admitting: Radiation Oncology

## 2022-12-28 ENCOUNTER — Ambulatory Visit: Payer: Medicare PPO

## 2022-12-28 DIAGNOSIS — Z510 Encounter for antineoplastic radiation therapy: Secondary | ICD-10-CM | POA: Diagnosis not present

## 2022-12-28 DIAGNOSIS — C7951 Secondary malignant neoplasm of bone: Secondary | ICD-10-CM

## 2022-12-28 DIAGNOSIS — Z191 Hormone sensitive malignancy status: Secondary | ICD-10-CM | POA: Diagnosis not present

## 2022-12-28 DIAGNOSIS — C778 Secondary and unspecified malignant neoplasm of lymph nodes of multiple regions: Secondary | ICD-10-CM | POA: Diagnosis not present

## 2022-12-28 DIAGNOSIS — C61 Malignant neoplasm of prostate: Secondary | ICD-10-CM | POA: Diagnosis not present

## 2022-12-28 LAB — RAD ONC ARIA SESSION SUMMARY
Course Elapsed Days: 8
Plan Fractions Treated to Date: 4
Plan Fractions Treated to Date: 7
Plan Prescribed Dose Per Fraction: 5 Gy
Plan Prescribed Dose Per Fraction: 8 Gy
Plan Total Fractions Prescribed: 10
Plan Total Fractions Prescribed: 5
Plan Total Prescribed Dose: 40 Gy
Plan Total Prescribed Dose: 50 Gy
Reference Point Dosage Given to Date: 32 Gy
Reference Point Dosage Given to Date: 35 Gy
Reference Point Session Dosage Given: 5 Gy
Reference Point Session Dosage Given: 8 Gy
Session Number: 7

## 2022-12-29 ENCOUNTER — Other Ambulatory Visit: Payer: Self-pay

## 2022-12-29 ENCOUNTER — Ambulatory Visit
Admission: RE | Admit: 2022-12-29 | Discharge: 2022-12-29 | Disposition: A | Discharge status: Home / Self Care | Payer: Medicare PPO | Source: Ambulatory Visit | Attending: Radiation Oncology

## 2022-12-29 DIAGNOSIS — C61 Malignant neoplasm of prostate: Secondary | ICD-10-CM | POA: Diagnosis not present

## 2022-12-29 DIAGNOSIS — Z191 Hormone sensitive malignancy status: Secondary | ICD-10-CM | POA: Diagnosis not present

## 2022-12-29 DIAGNOSIS — C7951 Secondary malignant neoplasm of bone: Secondary | ICD-10-CM | POA: Diagnosis not present

## 2022-12-29 DIAGNOSIS — Z510 Encounter for antineoplastic radiation therapy: Secondary | ICD-10-CM | POA: Diagnosis not present

## 2022-12-29 DIAGNOSIS — C778 Secondary and unspecified malignant neoplasm of lymph nodes of multiple regions: Secondary | ICD-10-CM | POA: Diagnosis not present

## 2022-12-29 LAB — RAD ONC ARIA SESSION SUMMARY
Course Elapsed Days: 9
Plan Fractions Treated to Date: 8
Plan Prescribed Dose Per Fraction: 5 Gy
Plan Total Fractions Prescribed: 10
Plan Total Prescribed Dose: 50 Gy
Reference Point Dosage Given to Date: 40 Gy
Reference Point Session Dosage Given: 5 Gy
Session Number: 8

## 2022-12-30 ENCOUNTER — Ambulatory Visit: Payer: Medicare PPO

## 2022-12-30 ENCOUNTER — Ambulatory Visit
Admission: RE | Admit: 2022-12-30 | Discharge: 2022-12-30 | Disposition: A | Discharge status: Home / Self Care | Payer: Medicare PPO | Source: Ambulatory Visit | Attending: Radiation Oncology | Admitting: Radiation Oncology

## 2022-12-30 ENCOUNTER — Other Ambulatory Visit: Payer: Self-pay

## 2022-12-30 DIAGNOSIS — Z510 Encounter for antineoplastic radiation therapy: Secondary | ICD-10-CM | POA: Diagnosis not present

## 2022-12-30 DIAGNOSIS — Z191 Hormone sensitive malignancy status: Secondary | ICD-10-CM | POA: Diagnosis not present

## 2022-12-30 DIAGNOSIS — C778 Secondary and unspecified malignant neoplasm of lymph nodes of multiple regions: Secondary | ICD-10-CM | POA: Diagnosis not present

## 2022-12-30 DIAGNOSIS — C61 Malignant neoplasm of prostate: Secondary | ICD-10-CM | POA: Insufficient documentation

## 2022-12-30 DIAGNOSIS — C7951 Secondary malignant neoplasm of bone: Secondary | ICD-10-CM | POA: Insufficient documentation

## 2022-12-30 LAB — RAD ONC ARIA SESSION SUMMARY
Course Elapsed Days: 10
Plan Fractions Treated to Date: 5
Plan Fractions Treated to Date: 9
Plan Prescribed Dose Per Fraction: 5 Gy
Plan Prescribed Dose Per Fraction: 8 Gy
Plan Total Fractions Prescribed: 10
Plan Total Fractions Prescribed: 5
Plan Total Prescribed Dose: 40 Gy
Plan Total Prescribed Dose: 50 Gy
Reference Point Dosage Given to Date: 40 Gy
Reference Point Dosage Given to Date: 45 Gy
Reference Point Session Dosage Given: 5 Gy
Reference Point Session Dosage Given: 8 Gy
Session Number: 9

## 2022-12-31 ENCOUNTER — Ambulatory Visit
Admission: RE | Admit: 2022-12-31 | Discharge: 2022-12-31 | Disposition: A | Discharge status: Home / Self Care | Payer: Medicare PPO | Source: Ambulatory Visit | Attending: Radiation Oncology | Admitting: Radiation Oncology

## 2022-12-31 ENCOUNTER — Encounter: Payer: Self-pay | Admitting: Adult Health

## 2022-12-31 ENCOUNTER — Ambulatory Visit
Admission: RE | Admit: 2022-12-31 | Discharge: 2022-12-31 | Disposition: A | Discharge status: Home / Self Care | Payer: Medicare PPO | Source: Ambulatory Visit | Attending: Radiation Oncology

## 2022-12-31 ENCOUNTER — Other Ambulatory Visit: Payer: Self-pay

## 2022-12-31 ENCOUNTER — Ambulatory Visit: Payer: Medicare PPO | Admitting: Adult Health

## 2022-12-31 DIAGNOSIS — F331 Major depressive disorder, recurrent, moderate: Secondary | ICD-10-CM

## 2022-12-31 DIAGNOSIS — Z510 Encounter for antineoplastic radiation therapy: Secondary | ICD-10-CM | POA: Diagnosis not present

## 2022-12-31 DIAGNOSIS — F411 Generalized anxiety disorder: Secondary | ICD-10-CM

## 2022-12-31 DIAGNOSIS — Z191 Hormone sensitive malignancy status: Secondary | ICD-10-CM | POA: Diagnosis not present

## 2022-12-31 DIAGNOSIS — C7951 Secondary malignant neoplasm of bone: Secondary | ICD-10-CM | POA: Diagnosis not present

## 2022-12-31 DIAGNOSIS — F429 Obsessive-compulsive disorder, unspecified: Secondary | ICD-10-CM | POA: Diagnosis not present

## 2022-12-31 DIAGNOSIS — C778 Secondary and unspecified malignant neoplasm of lymph nodes of multiple regions: Secondary | ICD-10-CM | POA: Diagnosis not present

## 2022-12-31 DIAGNOSIS — C61 Malignant neoplasm of prostate: Secondary | ICD-10-CM | POA: Diagnosis not present

## 2022-12-31 LAB — RAD ONC ARIA SESSION SUMMARY
Course Elapsed Days: 11
Plan Fractions Treated to Date: 10
Plan Prescribed Dose Per Fraction: 5 Gy
Plan Total Fractions Prescribed: 10
Plan Total Prescribed Dose: 50 Gy
Reference Point Dosage Given to Date: 50 Gy
Reference Point Session Dosage Given: 5 Gy
Session Number: 10

## 2022-12-31 MED ORDER — FLUVOXAMINE MALEATE 100 MG PO TABS
100.0000 mg | ORAL_TABLET | Freq: Two times a day (BID) | ORAL | 3 refills | Status: DC
Start: 2022-12-31 — End: 2023-07-04

## 2022-12-31 MED ORDER — DIAZEPAM 5 MG PO TABS
5.0000 mg | ORAL_TABLET | Freq: Every day | ORAL | 2 refills | Status: DC | PRN
Start: 2022-12-31 — End: 2023-07-04

## 2022-12-31 NOTE — Progress Notes (Signed)
Blake Carter [id number] [birth year] 72 y.o.  Subjective:   Patient ID:  Blake Carter is a 72 y.o. (DOB [birth year]) male.  Chief Complaint: No chief complaint on file.   HPI [name] presents to the office today for follow-up of MDD, GAD and OCD.  Describes mood today as "ok". Pleasant. Denies tearfulness. Mood symptoms - reports some mild depression with cancer recurrence. Denies anxiety. Reports some irritability. Denies worry, over thinking and rumination. Mood is consistent. Stating "I think I'm doing ok". Feels like Luvox continues to work well for him. Taking Diazepam as needed. Stable interest and motivation. Taking medications as prescribed. Energy levels stable. Active, walking some days. Enjoys some usual interests and activities. Married. Lives with wife. Has 2 grown children. Spending time with family. Appetite adequate. Weight loss - 245 from 250 pounds. Sleeps well most nights. Averages 8 to 10 hours. Denies daytime napping. Focus and concentration good. Completing tasks. Managing aspects of household. Retired Pensions consultant and professor.  Denies SI or HI.  Denies AH or VH. Denies self harm. Denies substance use.   PHQ2-9    Flowsheet Row CONSULT from 11/29/2022 in [facility] Radiation Oncology CARDIAC REHAB PHASE II EXERCISE from 10/02/2014 in [facility] CARDIAC REHAB PHASE II EXERCISE from 07/08/2014 in [facility]  PHQ-2 Total Score 0 0 0        Review of Systems:  Review of Systems  Musculoskeletal:  Negative for gait problem.  Neurological:  Negative for tremors.  Psychiatric/Behavioral:         Please refer to HPI    Medications: I have reviewed the patient's current medications.  Current Outpatient Medications  Medication Sig Dispense Refill   acetaminophen (TYLENOL) 500 MG tablet Take 1,000 mg by mouth every 6 (six)  hours as needed (pain).     ALPRAZolam (XANAX) 0.25 MG tablet Take 1 tablet (0.25 mg total) by mouth 2 (two) times daily as needed for anxiety. 60 tablet 2   amLODipine (NORVASC) 5 MG tablet Take 1 tablet (5 mg total) by mouth daily. 90 tablet 3   apixaban (ELIQUIS) 5 MG TABS tablet Take 1 tablet (5 mg total) by mouth 2 (two) times daily. 180 tablet 3   fenofibrate (TRICOR) 145 MG tablet Take 1 tablet (145 mg total) by mouth daily. 90 tablet 3   fluvoxaMINE (LUVOX) 100 MG tablet TAKE 1 TABLET BY MOUTH TWICE A DAY 180 tablet 3   furosemide (LASIX) 20 MG tablet TAKE 1 TABLET BY MOUTH  DAILY AS NEEDED FOR FLUID OR EDEMA 90 tablet 3   latanoprost (XALATAN) 0.005 % ophthalmic solution Place 1 drop into both eyes at bedtime.      leuprolide (LUPRON DEPOT, 78-MONTH,) 30 MG injection Inject 30 mg into the muscle every 6 (six) months.     levothyroxine (SYNTHROID) 137 MCG tablet Take 137 mcg by mouth every morning.     lisinopril (ZESTRIL) 40 MG tablet Take 1 tablet (40 mg total) by mouth daily. 90 tablet 3   metFORMIN (GLUCOPHAGE-XR) 500 MG 24 hr tablet Take 1 tablet by mouth at bedtime.     methylPREDNISolone (MEDROL DOSEPAK) 4 MG TBPK tablet TAKE 6 TABLETS ON DAY 1 AS DIRECTED ON PACKAGE AND DECREASE BY 1 TAB EACH DAY FOR A TOTAL OF 6 DAYS     metoprolol succinate (TOPROL-XL) 100 MG 24 hr tablet Take 1 tablet (100 mg total)  by mouth daily. TAKE WITH OR IMMEDIATELY FOLLOWING A MEAL. 90 tablet 3   MYRBETRIQ 50 MG TB24 tablet Take 50 mg by mouth daily.     nitroGLYCERIN (NITROSTAT) 0.4 MG SL tablet Place 1 tablet (0.4 mg total) under the tongue every 5 (five) minutes as needed for chest pain. 25 tablet 4   pantoprazole (PROTONIX) 40 MG tablet TAKE 2 TABLETS BY MOUTH EVERY DAY 180 tablet 0   rosuvastatin (CRESTOR) 40 MG tablet Take 1 tablet (40 mg total) by mouth daily. 90 tablet 3   No current facility-administered medications for this visit.    Medication Side Effects: None  Allergies:  Allergies   Allergen Reactions   Cimetidine Other (See Comments)    Unknown - over 30 years ago (2021) - unsure what occurred it may have been hives or flushing Other reaction(s): flushed   Atorvastatin     Myalgias on 80mg  dose   Hydrocodone Bit-Homatrop Mbr Other (See Comments)    Passed out   Niacin And Related Other (See Comments)    Flushing     Past Medical History:  Diagnosis Date   Allergic rhinitis    Aortic atherosclerosis (HCC)    Arthritis    "a little bit in some of my joints" (06/20/2014)   Ascending aorta dilatation (HCC)    43mm by chest CTA 05/2020 and 42 mm by chest MRI MRA 05/2021   Chronic diastolic CHF (congestive heart failure) (HCC)    Coronary artery disease    a. 05/2014 Cath/PCI: LM nl, LAD 40p, 60-70d (FFR 0.80->3.0x24 Synergy DES), D2 40, LCX 90d (2.25x12 Synergy DES), RCA 20-44m, EF 60-65%. b. Cath 02/2017 without acute change, elev LVEDP suggestive of dCHF.   Depression    Dilated aortic root (HCC)    aortic root at 44mm by Chest CTA 11/2019   ED (erectile dysfunction)    Environmental allergies    dry cough   GERD (gastroesophageal reflux disease)    History of hiatal hernia    Hydronephrosis of right kidney    a. s/p ureteral stenting in the past.   Hyperlipidemia    under control   Hypertension    Hypothyroidism    OCD (obsessive compulsive disorder)    Pacemaker    PAF (paroxysmal atrial fibrillation) (HCC)    Panic disorder    PAT (paroxysmal atrial tachycardia)    PONV (postoperative nausea and vomiting)    after thyroid surgery no problems in 10 years    Prostate cancer (HCC)    prostate cancer- robotic prostatectomy - followed by radiation because of positive lymph node--and pt on lupron injections   PVC's (premature ventricular contractions)    Sinus bradycardia    a. chronic, asymptomatic.  24 hour Holter 10/2016 showed Sinus bradycardia, sinus arrhythmia and normal sinus rhythm with average heart rate 50bpm and heart rate ranged from 32 to  100bpm.   SUI (stress urinary incontinence), male    S/P ROBOTIC PROSTATECTOMY AND RADIATION TX    Past Medical History, Surgical history, Social history, and Family history were reviewed and updated as appropriate.   Please see review of systems for further details on the patient's review from today.   Objective:   Physical Exam:  There were no vitals taken for this visit.  Physical Exam Constitutional:      General: He is not in acute distress. Musculoskeletal:        General: No deformity.  Neurological:     Mental Status: He is alert  and oriented to person, place, and time.     Coordination: Coordination normal.  Psychiatric:        Attention and Perception: Attention and perception normal. He does not perceive auditory or visual hallucinations.        Mood and Affect: Affect is not labile, blunt, angry or inappropriate.        Speech: Speech normal.        Behavior: Behavior normal.        Thought Content: Thought content normal. Thought content is not paranoid or delusional. Thought content does not include homicidal or suicidal ideation. Thought content does not include homicidal or suicidal plan.        Cognition and Memory: Cognition and memory normal.        Judgment: Judgment normal.     Comments: Insight intact     Lab Review:     Component Value Date/Time   NA 143 08/11/2021 0954   K 4.3 08/11/2021 0954   CL 107 (H) 08/11/2021 0954   CO2 22 08/11/2021 0954   GLUCOSE 92 08/11/2021 0954   GLUCOSE 98 10/31/2018 1247   BUN 23 08/11/2021 0954   CREATININE 1.03 08/11/2021 0954   CREATININE 0.92 10/31/2018 1247   CREATININE 1.07 10/14/2015 0850   CALCIUM 9.6 08/11/2021 0954   PROT 6.8 02/09/2022 1001   ALBUMIN 4.5 02/09/2022 1001   AST 20 02/09/2022 1001   AST 19 10/31/2018 1247   ALT 19 02/09/2022 1001   ALT 25 10/31/2018 1247   ALKPHOS 55 02/09/2022 1001   BILITOT 0.2 02/09/2022 1001   BILITOT 0.3 10/31/2018 1247   GFRNONAA 60 07/03/2020 1146    GFRNONAA >60 10/31/2018 1247   GFRAA 69 07/03/2020 1146   GFRAA >60 10/31/2018 1247       Component Value Date/Time   WBC 4.4 08/11/2021 0954   WBC 6.1 12/26/2019 0931   RBC 4.00 (L) 08/11/2021 0954   RBC 4.11 (L) 12/26/2019 0931   HGB 11.5 (L) 08/11/2021 0954   HCT 34.5 (L) 08/11/2021 0954   PLT 243 08/11/2021 0954   MCV 86 08/11/2021 0954   MCH 28.8 08/11/2021 0954   MCH 29.1 10/31/2018 1247   MCHC 33.3 08/11/2021 0954   MCHC 33.3 12/26/2019 0931   RDW 13.2 08/11/2021 0954   LYMPHSABS 1.4 12/26/2019 0931   LYMPHSABS 1.7 05/07/2019 1319   MONOABS 0.6 12/26/2019 0931   EOSABS 0.3 12/26/2019 0931   EOSABS 0.2 05/07/2019 1319   BASOSABS 0.0 12/26/2019 0931   BASOSABS 0.0 05/07/2019 1319    No results found for: "POCLITH", "LITHIUM"   No results found for: "PHENYTOIN", "PHENOBARB", "VALPROATE", "CBMZ"   .res Assessment: Plan:    Plan:  PDMP reviewed  Luvox 200mg  daily Valium 5mg  daily  RTC 6 months  Patient advised to contact office with any questions, adverse effects, or acute worsening in signs and symptoms.  Discussed potential benefits, risk, and side effects of benzodiazepines to include potential risk of tolerance and dependence, as well as possible drowsiness. Advised patient not to drive if experiencing drowsiness and to take lowest possible effective dose to minimize risk of dependence and tolerance.  There are no diagnoses linked to this encounter.   Please see After Visit Summary for patient specific instructions.  Future Appointments  Date Time Provider Department Center  12/31/2022  9:40 AM , Thereasa Solo, NP [facility]-CP None  12/31/2022 11:10 AM [facility] 1 [facility] None  12/31/2022 11:25 AM [facility] None  01/07/2023  8:00 AM Quintella Reichert, MD [facility]  02/08/2023  3:30 PM [facility]POST TREATMENT [facility] None  02/14/2023  7:05 AM [facility] DEVICE REMOTES [facility]  03/07/2023  1:00 PM  [facility]MR 1 [facility]  03/07/2023  2:00 PM MC-MR 1 [facility]MRI [facility]  05/16/2023  7:05 AM [facility] DEVICE REMOTES [facility]  08/15/2023  7:05 AM [facility] DEVICE REMOTES [facility]  11/14/2023  7:05 AM [facility] DEVICE REMOTES [facility]    No orders of the defined types were placed in this encounter.   -------------------------------

## 2023-01-03 NOTE — Radiation Completion Notes (Signed)
Patient Name: Blake Carter, Blake Carter MRN: [id number] Date of Birth: September 21, 1950 Referring Physician: Crecencio Mc, M.D. Date of Service: 2023-01-03 Radiation Oncologist: Margaretmary Bayley, M.D. [facility]                             RADIATION ONCOLOGY END OF TREATMENT NOTE     Diagnosis: C79.51 Secondary malignant neoplasm of bone Staging on 2010-06-08: Prostate cancer (HCC) T=pT3a, N=pN1, M=cM0 Intent: Palliative     ==========DELIVERED PLANS==========  First Treatment Date: 2022-12-20 - Last Treatment Date: 2022-12-31   Plan Name: Chest_UHRT Site: Mediastinum Technique: IMRT Mode: Photon Dose Per Fraction: 5 Gy Prescribed Dose (Delivered / Prescribed): 50 Gy / 50 Gy Prescribed Fxs (Delivered / Prescribed): 10 / 10   Plan Name: Spine_C_SBRT Site: Cervical Spine Technique: SBRT/SRT-IMRT Mode: Photon Dose Per Fraction: 8 Gy Prescribed Dose (Delivered / Prescribed): 40 Gy / 40 Gy Prescribed Fxs (Delivered / Prescribed): 5 / 5     ==========ON TREATMENT VISIT DATES========== 2022-12-20, 2022-12-22, 2022-12-24, 2022-12-24, 2022-12-28, 2022-12-30, 2022-12-31     ==========UPCOMING VISITS==========       ==========APPENDIX - ON TREATMENT VISIT NOTES==========   See weekly On Treatment Notes in Epic for details.

## 2023-01-06 ENCOUNTER — Encounter: Payer: Self-pay | Admitting: Radiation Oncology

## 2023-01-06 NOTE — Progress Notes (Signed)
Cardiology Office Note    Date:  01/07/2023   ID:  Blake Carter, DOB 07-21-1950, MRN [id number]  PCP:  [facility] [place]  Cardiologist:  Armanda Magic, MD  Electrophysiologist:  Lewayne Bunting, MD   Chief Complaint: f/u PAF, CAD, HTN, HLD  History of Present Illness:   Blake Carter is a 72 y.o. 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  Last cath was in 2018 showing patent stents, moderately elevated LVEDP. He underwent pacemaker implantation in 2020 for progressive bradycardia. Last echo 11/2019 EF 60-65%, grade 1 DD, mild AI (normal valve structure), moderate dilation of ascending aorta (46mm) with CTA showing 4.4cm.  Was seen in the office 01/30/20 with episodic palpitations associated with SOB and chest discomfort. He wore a monitor showing NSR/ST average HR 67bpm, nonsustained atrial tachycardia, intermittent V pacing, occsaional PVCs, bigeminal PVCs, trigeminal PVCs and ventricular couplets and triplets, and wide complex tachycardia up to 5 beats. He was started on metoprolol. Nuclear stress test was normal. It does appear that on pacemaker interrogation on 01/26/20 the patient had actually had an episode of atrial fibrillation (prior to event monitor) therefore he was started on anticoagulation. He had itching with Eliquis so was changed to Xarelto.  He underwent cath 07/2020 showing widely patent LCx and LAD stents, normal RCA and LM and diffuse nonobstructive disease of the PDA.  There was a 30% stenosis prior to the mid LAD stent and 25-30% in distal LCx.  FFR was normal with no evidence of microvascular disease and LVEDP was normal.  Medical management was recommended.    He is here today for followup and is feeling poorly today after getting another round of XRT for metastatic prostate CA.  He denies any chest pain or pressure, SOB, DOE, PND, orthopnea, LE edema, dizziness, palpitations or syncope. He is compliant with his meds and is tolerating meds with no SE.    Past Medical History:  Diagnosis Date   Allergic rhinitis    Aortic atherosclerosis (HCC)    Arthritis    "a little bit in some of my joints" (06/20/2014)   Ascending aorta dilatation (HCC)    43mm by chest CTA 05/2020 and 42 mm by chest MRI MRA 05/2021   Chronic diastolic CHF (congestive heart failure) (HCC)    Coronary artery disease    a. 05/2014 Cath/PCI: LM nl, LAD 40p, 60-70d (FFR 0.80->3.0x24 Synergy DES), D2 40, LCX 90d (2.25x12 Synergy DES), RCA 20-53m, EF 60-65%. b. Cath 02/2017 without acute change, elev LVEDP suggestive of dCHF.   Depression    Dilated aortic root (HCC)    aortic root at 44mm by Chest CTA 11/2019   ED (erectile dysfunction)    Environmental allergies    dry cough   GERD (gastroesophageal reflux disease)    History of hiatal hernia    Hydronephrosis of right kidney    a. s/p ureteral stenting in the past.   Hyperlipidemia    under control   Hypertension    Hypothyroidism    OCD (obsessive compulsive disorder)    Pacemaker    PAF (paroxysmal atrial fibrillation) (HCC)    Panic disorder    PAT (paroxysmal atrial tachycardia)    PONV (postoperative  nausea and vomiting)    after thyroid surgery no problems in 10 years    Prostate cancer [facility])    prostate cancer- robotic prostatectomy - followed by radiation because of positive lymph node--and pt on lupron injections   PVC's (premature ventricular contractions)    Sinus bradycardia    a. chronic, asymptomatic.  24 hour Holter 10/2016 showed Sinus bradycardia, sinus arrhythmia and normal sinus rhythm with average heart rate 50bpm and heart rate ranged from 32 to 100bpm.   SUI  (stress urinary incontinence), male    S/P ROBOTIC PROSTATECTOMY AND RADIATION TX    Past Surgical History:  Procedure Laterality Date   COLONOSCOPY  07/2013   Dr. Randa Evens   CORONARY ANGIOPLASTY WITH STENT PLACEMENT  06/20/2014   "2"   CORONARY PRESSURE/FFR STUDY N/A 07/08/2020   Procedure: INTRAVASCULAR PRESSURE WIRE/FFR STUDY;  Surgeon: Lyn Records, MD;  Location: [facility] IN[facility]IVE CV [facility];  Service: Cardiovascular;  Laterality: N/A;   CYSTOSCOPY  11/30/2011   Procedure: CYSTOSCOPY;  Surgeon: Martina Sinner, MD;  Location: [facility] ORS;  Service: Urology;  Laterality: N/A;  Inplantation of Artificial Sphincter and Cystoscopy   CYSTOSCOPY WITH RETROGRADE PYELOGRAM, URETEROSCOPY AND STENT PLACEMENT Right 02/11/2014   Procedure: CYSTOSCOPY WITH RETROGRADE PYELOGRAM, URETEROSCOPY ,URETERAL BIOPSY AND STENT PLACEMENT;  Surgeon: Heloise Purpura, MD;  Location: [facility];  Service: Urology;  Laterality: Right;   CYSTOSCOPY WITH RETROGRADE PYELOGRAM, URETEROSCOPY AND STENT PLACEMENT Right 04/15/2014   Procedure: CYSTOSCOPY WITH RETROGRADE PYELOGRAM, URETEROSCOPY, BIOPSY AND STENT PLACEMENT;  Surgeon: Heloise Purpura, MD;  Location: [facility];  Service: Urology;  Laterality: Right;   LEFT HEART CATH AND CORONARY ANGIOGRAPHY N/A 03/29/2017   Procedure: LEFT HEART CATH AND CORONARY ANGIOGRAPHY;  Surgeon: Marykay Lex, MD;  Location: [facility] INVASIVE CV LAB;  Service: Cardiovascular;  Laterality: N/A;   LEFT HEART CATHETERIZATION WITH CORONARY ANGIOGRAM N/A 06/20/2014   Procedure: LEFT HEART CATHETERIZATION WITH CORONARY ANGIOGRAM;  Surgeon: Marykay Lex, MD;  Location: [facility] CATH LAB;  Service: Cardiovascular;  Laterality: N/A;   PACEMAKER IMPLANT N/A 05/21/2019   Procedure: PACEMAKER IMPLANT;  Surgeon: Marinus Maw, MD;  Location: [facility] INV[facility] CV [facility];  Service: Cardiovascular;  Laterality: N/A;   PERCUTANEOUS CORONARY STENT INTERVENTION (PCI-S)  06/20/2014   Procedure: PERCUTANEOUS CORONARY STENT INTERVENTION (PCI-S);   Surgeon: Marykay Lex, MD;  Location: [facility] CATH LAB;  Service: Cardiovascular;;  LAD and Circumflex   REFRACTIVE SURGERY Bilateral 2004   RIGHT/LEFT HEART CATH AND CORONARY ANGIOGRAPHY N/A 07/08/2020   Procedure: RIGHT/LEFT HEART CATH AND CORONARY ANGIOGRAPHY;  Surgeon: Lyn Records, MD;  Location: [facility] INVASIVE CV LAB;  Service: Cardiovascular;  Laterality: N/A;   ROBOT ASSISTED LAPAROSCOPIC RADICAL PROSTATECTOMY  05/2010   THYROIDECTOMY, PARTIAL  10/1974   TONSILLECTOMY  1956    TOTAL THYROIDECTOMY  10/2012   "nuked it"   UPPER GI ENDOSCOPY     food impaction 2009 done by Dr Randa Evens   URINARY SPHINCTER IMPLANT  11/30/2011   Procedure: ARTIFICIAL URINARY SPHINCTER;  Surgeon: Martina Sinner, MD;  Location: [facility];  Service: Urology;  Laterality: N/A;    Current Medications: Current Meds  Medication Sig   acetaminophen (TYLENOL) 500 MG tablet Take 1,000 mg by mouth every 6 (six) hours as needed (pain).   Cholecalciferol (VITAMIN D3) 2000 units TABS Take 6,000 Units by mouth 3 (three) times a week.    diazepam (VALIUM) 5 MG tablet Take 1 tablet (5 mg total) by mouth at bedtime.   fenofibrate (  TRICOR) 145 MG tablet Take 1 tablet (145 mg total) by mouth daily.   fexofenadine (ALLEGRA) 180 MG tablet Take 180 mg by mouth daily.   fluvoxaMINE (LUVOX) 50 MG tablet Take 200 mg by mouth every morning.    furosemide (LASIX) 20 MG tablet TAKE 1 TABLET BY MOUTH  DAILY AS NEEDED FOR FLUID  OR EDEMA   latanoprost (XALATAN) 0.005 % ophthalmic solution Place 1 drop into both eyes at bedtime.    leuprolide, 6 Month, (ELIGARD) 45 MG injection Inject 45 mg into the skin every 6 (six) months.   levothyroxine (SYNTHROID) 175 MCG tablet PT TAKES 1 TABLET BY MOUTH DAILY EXCEPT SUNDAYS   lisinopril (ZESTRIL) 10 MG tablet Take 1 tablet by mouth daily.   MYRBETRIQ 50 MG TB24 tablet Take 50 mg by mouth daily.   nitroGLYCERIN (NITROSTAT) 0.4 MG SL tablet PLACE 1 TABLET (0.4 MG TOTAL) UNDER THE TONGUE EVERY 5 (FIVE)  MINUTES AS NEEDED FOR CHEST PAIN.   pantoprazole (PROTONIX) 40 MG tablet Take 2 tablets (80 mg total) by mouth daily.   rivaroxaban (XARELTO) 20 MG TABS tablet Take 1 tablet (20 mg total) by mouth daily with supper.   amLODipine (NORVASC) 10 MG tablet Take 1 tablet (10 mg total) by mouth every evening.   metoprolol succinate (TOPROL XL) 25 MG 24 hr tablet Take 1 tablet (25 mg total) by mouth daily.        Allergies:   Cimetidine, Atorvastatin, Hydrocodone bit-homatrop mbr, and Niacin and related   Social History   Socioeconomic History   Marital status: Married    Spouse name: Not on file   Number of children: Not on file   Years of education: Not on file   Highest education level: Not on file  Occupational History   Not on file  Tobacco Use   Smoking status: Never   Smokeless tobacco: Never  Vaping Use   Vaping status: Never Used  Substance and Sexual Activity   Alcohol use: Yes    Alcohol/week: 0.0 standard drinks of alcohol    Comment: 06/20/2014 "1-2 drinks a couple times/month"   Drug use: No   Sexual activity: Yes  Other Topics Concern   Not on file  Social History Narrative   Not on file   Social Determinants of Health   Financial Resource Strain: Not on file  Food Insecurity: No Food Insecurity (11/29/2022)   Hunger Vital Sign    Worried About Running Out of Food in the Last Year: Never true    Ran Out of Food in the Last Year: Never true  Transportation Needs: No Transportation Needs (11/29/2022)   PRAPARE - Administrator, Civil Service (Medical): No    Lack of Transportation (Non-Medical): No  Physical Activity: Not on file  Stress: Not on file  Social Connections: Unknown (10/13/2021)   Received from [facility]   Social Network    Social Network: Not on file     Family History:  The patient's family history includes CAD in his brother; Cancer in his father; Heart disease in his father; Hyperlipidemia in his father.  ROS:    Please see the history of present illness. Denies any unusual bleeding on Eliquis. All other systems are reviewed and otherwise negative.    EKGs/Labs/Other Studies Reviewed:    Studies reviewed are outlined and summarized above. Reports included below if pertinent.  Cardiac Cath 07/08/2020 Conclusion  Widely patent LAD and circumflex stents. Widely patent left main  Widely patent circumflex. The left anterior descending contains 30% stenosis proximal to the mid stent.  Hemodynamic assessment was performed with the Pressure Wire X demonstrating an RFR of 0.9 and an FFR of 0.82.  A full hemodynamic assessment to exclude microvascular dysfunction was performed.   Right coronary is widely patent with luminal irregularities.  There is diffuse disease in the PDA but without focal high-grade obstruction. Left ventricular size is normal.  EF is 55%.  EDP is normal. Right heart pressures are normal.   RECOMMENDATIONS:   No evidence of significant epicardial disease or microvascular dysfunction. Continue aggressive risk factor modification. Initiate an exercise program and weight loss.    2D echo 713/21 IMPRESSIONS     1. Left ventricular ejection fraction, by estimation, is 60 to 65%. The  left ventricle has normal function. The left ventricle has no regional  wall motion abnormalities. Left ventricular diastolic parameters are  consistent with Grade I diastolic  dysfunction (impaired relaxation).   2. Right ventricular systolic function is normal. The right ventricular  size is normal. There is normal pulmonary artery systolic pressure.   3. The mitral valve is normal in structure. No evidence of mitral valve  regurgitation. No evidence of mitral stenosis.   4. The aortic valve is normal in structure. Aortic valve regurgitation is  mild. No aortic stenosis is present.   5. Aortic dilatation noted. There is moderate dilatation of the ascending  aorta measuring 46 mm.   6. The  inferior vena cava is normal in size with greater than 50%  respiratory variability, suggesting right atrial pressure of 3 mmHg.   Comparison(s): Prior ECHO 42 mm ascending aorta. Current 45 mm. Consider  CTA of aorta for comparison.     EKG:  EKG is ordered today and demonstrates NSR with nonspecific T wave abnormality  Recent Labs: 02/09/2022: ALT 19  Recent Lipid Panel    Component Value Date/Time   CHOL 120 02/09/2022 1001   TRIG 146 02/09/2022 1001   HDL 39 (L) 02/09/2022 1001   CHOLHDL 3.1 02/09/2022 1001   CHOLHDL 3.8 03/29/2017 0554   VLDL 34 03/29/2017 0554   LDLCALC 56 02/09/2022 1001    PHYSICAL EXAM:    VS:  BP 124/88   Pulse 72   Ht 5\' 11"  (1.803 m)   Wt 249 lb 3.2 oz (113 kg)   SpO2 93%   BMI 34.76 kg/m   BMI: Body mass index is 34.76 kg/m.  GEN: Well nourished, well developed in no acute distress HEENT: Normal NECK: No JVD; No carotid bruits LYMPHATICS: No lymphadenopathy CARDIAC:RRR, no murmurs, rubs, gallops RESPIRATORY:  Clear to auscultation without rales, wheezing or rhonchi  ABDOMEN: Soft, non-tender, non-distended MUSCULOSKELETAL:  No edema; No deformity  SKIN: Warm and dry NEUROLOGIC:  Alert and oriented x 3 PSYCHIATRIC:  Normal affect  Wt Readings from Last 3 Encounters:  01/07/23 249 lb 3.2 oz (113 kg)  11/29/22 250 lb 8 oz (113.6 kg)  11/16/22 252 lb 3.2 oz (114.4 kg)     ASSESSMENT & PLAN:    Paroxysmal atrial fibrillation  -He remains in normal sinus rhythm and denies any palpitations -Denies bleeding issues on DOAC -Continue prescription drug management with apixaban 5 mg twice daily and Toprol XL 100 mg daily with as needed refills -check CBC and BMET today  ASCAD  -Cardiac cath for exertional angina 07/2020 showed widely patent LAD and LCX stents with 30% stenosis proximal to mid LAD stent and diffuse nonobstructive disease in  the PDA with 25-30% distal LCx , no other CAD and no evidence of microvascular disease.  LVEDP was  normal -He has not had anginal symptoms since I saw him last -Continue prescription drug management with Toprol-XL 100 mg daily and Crestor 40 mg daily.  Refills -no ASA due to DOAC  Essential HTN  -BP is adequately controlled on exam today -Continue prescription drug management with amlodipine 5 mg daily, lisinopril 40 mg daily, Toprol-XL 100 mg daily.  Refills -continue chlo Hyperlipidemia  -LDL goal < 70 -Continue prescription drug management with Crestor 40 mg daily and fenofibrate 125 mg daily with as needed refills -check FLP and ALT  Aortic atherosclerosis/Dilated ascending aorta  -41mm by CTA 05/2020 -42mm by chest MRI/MRA 05/2021 -repeat MRI/MRA 07/2022 measuring 42 to 44 mm -repeat MRI/MRA 07/2023 -continue statin -BP is controlled  Chronic diastolic CHF  -He does not appear volume overloaded on exam today -LVEPD was normal on cath  -only has to use Lasix sporadically.   SSS/Bradycardia -s/p PPM followed by Dr. Ladona Ridgel  Followup with me in 1 year   Signed, Armanda Magic, MD  01/07/2023 8:14 AM    [facility] [address] [place], [place], [place]  [address] Phone: [phone]; Fax: [phone]

## 2023-01-07 ENCOUNTER — Ambulatory Visit: Payer: Medicare PPO | Attending: Cardiology | Admitting: Cardiology

## 2023-01-07 ENCOUNTER — Encounter: Payer: Self-pay | Admitting: Cardiology

## 2023-01-07 ENCOUNTER — Other Ambulatory Visit: Payer: Self-pay | Admitting: Radiation Oncology

## 2023-01-07 ENCOUNTER — Telehealth: Payer: Self-pay

## 2023-01-07 VITALS — BP 124/88 | HR 72 | Ht 71.0 in | Wt 249.2 lb

## 2023-01-07 DIAGNOSIS — I2583 Coronary atherosclerosis due to lipid rich plaque: Secondary | ICD-10-CM

## 2023-01-07 DIAGNOSIS — E785 Hyperlipidemia, unspecified: Secondary | ICD-10-CM | POA: Diagnosis not present

## 2023-01-07 DIAGNOSIS — Z79899 Other long term (current) drug therapy: Secondary | ICD-10-CM | POA: Diagnosis not present

## 2023-01-07 DIAGNOSIS — E7800 Pure hypercholesterolemia, unspecified: Secondary | ICD-10-CM

## 2023-01-07 DIAGNOSIS — I7121 Aneurysm of the ascending aorta, without rupture: Secondary | ICD-10-CM

## 2023-01-07 DIAGNOSIS — I480 Paroxysmal atrial fibrillation: Secondary | ICD-10-CM

## 2023-01-07 DIAGNOSIS — I495 Sick sinus syndrome: Secondary | ICD-10-CM | POA: Diagnosis not present

## 2023-01-07 DIAGNOSIS — I5032 Chronic diastolic (congestive) heart failure: Secondary | ICD-10-CM | POA: Diagnosis not present

## 2023-01-07 DIAGNOSIS — I77810 Thoracic aortic ectasia: Secondary | ICD-10-CM

## 2023-01-07 DIAGNOSIS — I10 Essential (primary) hypertension: Secondary | ICD-10-CM | POA: Diagnosis not present

## 2023-01-07 DIAGNOSIS — I2510 Atherosclerotic heart disease of native coronary artery without angina pectoris: Secondary | ICD-10-CM | POA: Diagnosis not present

## 2023-01-07 DIAGNOSIS — I77819 Aortic ectasia, unspecified site: Secondary | ICD-10-CM

## 2023-01-07 LAB — CBC WITH DIFFERENTIAL/PLATELET

## 2023-01-07 LAB — COMPREHENSIVE METABOLIC PANEL
ALT: 19 IU/L (ref 0–44)
AST: 20 IU/L (ref 0–40)
Albumin: 4.5 g/dL (ref 3.9–4.9)
Alkaline Phosphatase: 49 IU/L (ref 44–121)
BUN/Creatinine Ratio: 22 (ref 10–24)
BUN: 27 mg/dL (ref 8–27)
Bilirubin Total: 0.3 mg/dL (ref 0.0–1.2)
CO2: 23 mmol/L (ref 20–29)
Calcium: 9.7 mg/dL (ref 8.6–10.2)
Chloride: 102 mmol/L (ref 96–106)
Creatinine, Ser: 1.25 mg/dL (ref 0.76–1.27)
Globulin, Total: 2.5 g/dL (ref 1.5–4.5)
Glucose: 110 mg/dL — ABNORMAL HIGH (ref 70–99)
Potassium: 4.4 mmol/L (ref 3.5–5.2)
Sodium: 139 mmol/L (ref 134–144)
Total Protein: 7 g/dL (ref 6.0–8.5)
eGFR: 62 mL/min/{1.73_m2} (ref >59)

## 2023-01-07 LAB — LIPID PANEL
Chol/HDL Ratio: 3.5 ratio (ref 0.0–5.0)
Cholesterol, Total: 147 mg/dL (ref 100–199)
HDL: 42 mg/dL (ref >39)
LDL Chol Calc (NIH): 77 mg/dL (ref 0–99)
Triglycerides: 166 mg/dL — ABNORMAL HIGH (ref 0–149)
VLDL Cholesterol Cal: 28 mg/dL (ref 5–40)

## 2023-01-07 MED ORDER — SUCRALFATE 1 G PO TABS: 1.0000 g | ORAL_TABLET | Freq: Three times a day (TID) | ORAL | 2 refills | Status: DC

## 2023-01-07 MED ORDER — NITROGLYCERIN 0.4 MG SL SUBL
0.4000 mg | SUBLINGUAL_TABLET | SUBLINGUAL | 4 refills | Status: DC | PRN
Start: 2023-01-07 — End: 2024-02-03

## 2023-01-07 NOTE — Telephone Encounter (Signed)
RN called patient back to inform him that prescription for Carafate was sent into his pharmacy for esophagitis.  He was told to take as directed.  Appreciative of the call and thanks all that was involved in his care.

## 2023-01-07 NOTE — Patient Instructions (Signed)
Medication Instructions:  Your physician recommends that you continue on your current medications as directed. Please refer to the Current Medication list given to you today.  *If you need a refill on your cardiac medications before your next appointment, please call your pharmacy*   Lab Work: Please complete a CMET, CBC, and a FASTING lipid panel in our lab before you leave today.   If you have labs (blood work) drawn today and your tests are completely normal, you will receive your results only by: MyChart Message (if you have MyChart) OR A paper copy in the mail If you have any lab test that is abnormal or we need to change your treatment, we will call you to review the results.   Testing/Procedures: Dr. Mayford Knife has ordered a chest MRI/MRA for you to be completed in February 2025. Someone should be calling you to schedule this.    Follow-Up: At [facility], you and your health needs are our priority.  As part of our continuing mission to provide you with exceptional heart care, we have created designated Provider Care Teams.  These Care Teams include your primary Cardiologist (physician) and Advanced Practice Providers (APPs -  Physician Assistants and Nurse Practitioners) who all work together to provide you with the care you need, when you need it.  We recommend signing up for the patient portal called "MyChart".  Sign up information is provided on this After Visit Summary.  MyChart is used to connect with patients for Virtual Visits (Telemedicine).  Patients are able to view lab/test results, encounter notes, upcoming appointments, etc.  Non-urgent messages can be sent to your provider as well.   To learn more about what you can do with MyChart, go to [email or web address].    Your next appointment:   1 year(s)  Provider:   Armanda Magic, MD

## 2023-01-09 ENCOUNTER — Other Ambulatory Visit: Payer: Self-pay | Admitting: Radiation Oncology

## 2023-01-09 ENCOUNTER — Emergency Department (HOSPITAL_COMMUNITY): Payer: Medicare PPO

## 2023-01-09 ENCOUNTER — Other Ambulatory Visit: Payer: Self-pay

## 2023-01-09 ENCOUNTER — Encounter (HOSPITAL_COMMUNITY): Payer: Self-pay

## 2023-01-09 ENCOUNTER — Emergency Department (HOSPITAL_COMMUNITY)
Admission: EM | Admit: 2023-01-09 | Discharge: 2023-01-09 | Disposition: A | Discharge status: Home / Self Care | Payer: Medicare PPO | Attending: Emergency Medicine | Admitting: Emergency Medicine

## 2023-01-09 DIAGNOSIS — R591 Generalized enlarged lymph nodes: Secondary | ICD-10-CM | POA: Diagnosis not present

## 2023-01-09 DIAGNOSIS — Z923 Personal history of irradiation: Secondary | ICD-10-CM | POA: Insufficient documentation

## 2023-01-09 DIAGNOSIS — K208 Other esophagitis without bleeding: Secondary | ICD-10-CM | POA: Diagnosis not present

## 2023-01-09 DIAGNOSIS — K5730 Diverticulosis of large intestine without perforation or abscess without bleeding: Secondary | ICD-10-CM | POA: Diagnosis not present

## 2023-01-09 DIAGNOSIS — K439 Ventral hernia without obstruction or gangrene: Secondary | ICD-10-CM | POA: Insufficient documentation

## 2023-01-09 DIAGNOSIS — I700 Atherosclerosis of aorta: Secondary | ICD-10-CM | POA: Insufficient documentation

## 2023-01-09 DIAGNOSIS — K760 Fatty (change of) liver, not elsewhere classified: Secondary | ICD-10-CM | POA: Insufficient documentation

## 2023-01-09 DIAGNOSIS — Z8546 Personal history of malignant neoplasm of prostate: Secondary | ICD-10-CM | POA: Diagnosis not present

## 2023-01-09 DIAGNOSIS — Z7901 Long term (current) use of anticoagulants: Secondary | ICD-10-CM | POA: Insufficient documentation

## 2023-01-09 DIAGNOSIS — K8020 Calculus of gallbladder without cholecystitis without obstruction: Secondary | ICD-10-CM | POA: Insufficient documentation

## 2023-01-09 DIAGNOSIS — R001 Bradycardia, unspecified: Secondary | ICD-10-CM | POA: Diagnosis not present

## 2023-01-09 DIAGNOSIS — Z950 Presence of cardiac pacemaker: Secondary | ICD-10-CM | POA: Diagnosis not present

## 2023-01-09 DIAGNOSIS — R079 Chest pain, unspecified: Secondary | ICD-10-CM | POA: Diagnosis not present

## 2023-01-09 DIAGNOSIS — K449 Diaphragmatic hernia without obstruction or gangrene: Secondary | ICD-10-CM | POA: Diagnosis not present

## 2023-01-09 DIAGNOSIS — K209 Esophagitis, unspecified without bleeding: Secondary | ICD-10-CM | POA: Diagnosis not present

## 2023-01-09 DIAGNOSIS — C61 Malignant neoplasm of prostate: Secondary | ICD-10-CM | POA: Diagnosis not present

## 2023-01-09 DIAGNOSIS — R1010 Upper abdominal pain, unspecified: Secondary | ICD-10-CM | POA: Diagnosis present

## 2023-01-09 LAB — COMPREHENSIVE METABOLIC PANEL
ALT: 20 U/L (ref 0–44)
AST: 19 U/L (ref 15–41)
Albumin: 4.1 g/dL (ref 3.5–5.0)
Alkaline Phosphatase: 42 U/L (ref 38–126)
Anion gap: 11 (ref 5–15)
BUN: 27 mg/dL — ABNORMAL HIGH (ref 8–23)
CO2: 22 mmol/L (ref 22–32)
Calcium: 9.4 mg/dL (ref 8.9–10.3)
Chloride: 103 mmol/L (ref 98–111)
Creatinine, Ser: 1.02 mg/dL (ref 0.61–1.24)
GFR, Estimated: >60 mL/min (ref >60)
Glucose, Bld: 130 mg/dL — ABNORMAL HIGH (ref 70–99)
Potassium: 3.9 mmol/L (ref 3.5–5.1)
Sodium: 136 mmol/L (ref 135–145)
Total Bilirubin: 0.5 mg/dL (ref 0.3–1.2)
Total Protein: 7.7 g/dL (ref 6.5–8.1)

## 2023-01-09 LAB — CBC
HCT: 36.6 % — ABNORMAL LOW (ref 39.0–52.0)
Hemoglobin: 11.7 g/dL — ABNORMAL LOW (ref 13.0–17.0)
MCH: 28.5 pg (ref 26.0–34.0)
MCHC: 32 g/dL (ref 30.0–36.0)
MCV: 89.3 fL (ref 80.0–100.0)
Platelets: 198 10*3/uL (ref 150–400)
RBC: 4.1 MIL/uL — ABNORMAL LOW (ref 4.22–5.81)
RDW: 14.8 % (ref 11.5–15.5)
WBC: 4.7 10*3/uL (ref 4.0–10.5)
nRBC: 0 % (ref 0.0–0.2)

## 2023-01-09 LAB — URINALYSIS, ROUTINE W REFLEX MICROSCOPIC
Bacteria, UA: NONE SEEN
Bilirubin Urine: NEGATIVE
Glucose, UA: NEGATIVE mg/dL
Ketones, ur: NEGATIVE mg/dL
Leukocytes,Ua: NEGATIVE
Nitrite: NEGATIVE
Protein, ur: NEGATIVE mg/dL
Specific Gravity, Urine: 1.027 (ref 1.005–1.030)
pH: 5 (ref 5.0–8.0)

## 2023-01-09 LAB — LIPASE, BLOOD: Lipase: 44 U/L (ref 11–51)

## 2023-01-09 LAB — TROPONIN I (HIGH SENSITIVITY): Troponin I (High Sensitivity): 4 ng/L (ref <18)

## 2023-01-09 MED ORDER — LACTATED RINGERS IV BOLUS
500.0000 mL | Freq: Once | INTRAVENOUS | Status: AC
  Administered 2023-01-09: 500 mL via INTRAVENOUS

## 2023-01-09 MED ORDER — PANTOPRAZOLE SODIUM 40 MG IV SOLR
40.0000 mg | Freq: Once | INTRAVENOUS | Status: AC
  Administered 2023-01-09: 40 mg via INTRAVENOUS
  Filled 2023-01-09: qty 10

## 2023-01-09 MED ORDER — ALUM & MAG HYDROXIDE-SIMETH 200-200-20 MG/5ML PO SUSP
30.0000 mL | Freq: Once | ORAL | Status: AC
  Administered 2023-01-09: 30 mL via ORAL
  Filled 2023-01-09: qty 30

## 2023-01-09 MED ORDER — IOHEXOL 300 MG/ML  SOLN
100.0000 mL | Freq: Once | INTRAMUSCULAR | Status: AC | PRN
  Administered 2023-01-09: 100 mL via INTRAVENOUS

## 2023-01-09 MED ORDER — LIDOCAINE VISCOUS HCL 2 % MT SOLN: 10.0000 mL | OROMUCOSAL | 1 refills | Status: DC | PRN

## 2023-01-09 NOTE — ED Provider Notes (Signed)
[facility]ERGENC[facility] DEPARTMENT AT [facility] Provider Note   CSN: [id number] Arrival date & time: 01/09/23  4010     History  Chief Complaint  Patient presents with   Abdominal Pain    Blake Carter is a 72 y.o. male.  HPI 72 year old male with known metastatic prostate cancer, status post recent radiation to chest presents today complaining of upper abdominal burning.  He states that he began having some pain and difficulty swallowing initially after the episode.  His oncologist gave him Carafate on Friday.  He felt that it was worse since that time.  He has been taking the Maalox and takes Protonix regularly.  He had worsening of the upper abdominal burning sensation today.  He states it is 10 out of 10.  He has not had associated nausea vomiting, diarrhea, fever, chills, or shortness of breath.  He has a known history of coronary artery disease.  He does not drink alcohol on a regular basis and has not  recently imbibed alcohol.  Denies any hemoptysis or hematemesis.  He has had normal bowel movements although somewhat decreased recently.  He has a history of gallstones seen on previous imaging that were asymptomatic  Home Medications Prior to Admission medications   Medication Sig Start Date End Date Taking? Authorizing Provider  acetaminophen (TYLENOL) 500 MG tablet Take 1,000 mg by mouth every 6 (six) hours as needed (pain).    [provider]  ALPRAZolam Prudy Feeler) 0.25 MG tablet Take 1 tablet (0.25 mg total) by mouth 2 (two) times daily as needed for anxiety. 07/17/20   Mozingo, Thereasa Solo, NP  amLODipine (NORVASC) 5 MG tablet Take 1 tablet (5 mg total) by mouth daily. 11/16/22   Marinus Maw, MD  apixaban (ELIQUIS) 5 MG TABS tablet Take 1 tablet (5 mg total) by mouth 2 (two) times daily. 02/15/22   Swinyer, Zachary George, NP  diazepam (VALIUM) 5 MG tablet Take 1 tablet (5 mg total) by mouth daily as needed for anxiety. 12/31/22   Mozingo, Thereasa Solo, NP   fenofibrate (TRICOR) 145 MG tablet Take 1 tablet (145 mg total) by mouth daily. 02/15/22   Swinyer, Zachary George, NP  fluvoxaMINE (LUVOX) 100 MG tablet Take 1 tablet (100 mg total) by mouth 2 (two) times daily. 12/31/22   Mozingo, Thereasa Solo, NP  furosemide (LASIX) 20 MG tablet TAKE 1 TABLET BY MOUTH  DAILY AS NEEDED FOR FLUID OR EDEMA 02/15/22   Swinyer, Zachary George, NP  latanoprost (XALATAN) 0.005 % ophthalmic solution Place 1 drop into both eyes at bedtime.  03/18/15   [provider]  leuprolide (LUPRON DEPOT, 74-MONTH,) 30 MG injection Inject 30 mg into the muscle every 6 (six) months. 01/04/18   [provider]  levothyroxine (SYNTHROID) 137 MCG tablet Take 137 mcg by mouth every morning. 01/17/22   [provider]  lisinopril (ZESTRIL) 40 MG tablet Take 1 tablet (40 mg total) by mouth daily. 02/15/22 02/16/23  Swinyer, Zachary George, NP  metFORMIN (GLUCOPHAGE-XR) 500 MG 24 hr tablet Take 1 tablet by mouth at bedtime. 07/17/21   [provider]  metoprolol succinate (TOPROL-XL) 100 MG 24 hr tablet Take 1 tablet (100 mg total) by mouth daily. TAKE WITH OR IMMEDIATELY FOLLOWING A MEAL. 02/15/22 02/16/23  Swinyer, Zachary George, NP  MYRBETRIQ 50 MG TB24 tablet Take 50 mg by mouth daily. 02/10/19   [provider]  nitroGLYCERIN (NITROSTAT) 0.4 MG SL tablet Place 1 tablet (0.4 mg total) under the tongue  every 5 (five) minutes as needed for chest pain. 01/07/23   Quintella Reichert, MD  oxyCODONE-acetaminophen (PERCOCET/ROXICET) 5-325 MG tablet Take 1 tablet by mouth every 4 (four) hours as needed. 12/08/22   [provider]  pantoprazole (PROTONIX) 40 MG tablet TAKE 2 TABLETS BY MOUTH EVERY DAY 12/06/22   Quintella Reichert, MD  rosuvastatin (CRESTOR) 40 MG tablet Take 1 tablet (40 mg total) by mouth daily. 02/15/22   Swinyer, Zachary George, NP  sucralfate (CARAFATE) 1 g tablet Take 1 tablet (1 g total) by mouth 4 (four) times daily -  with meals and at bedtime. 5 min before  meals for radiation induced esophagitis 01/07/23   Margaretmary Dys, MD  traMADol (ULTRAM) 50 MG tablet Take 100 mg by mouth 3 (three) times daily as needed. 12/03/22   [provider]      Allergies    Cimetidine, Atorvastatin, Hydrocodone bit-homatrop mbr, and Niacin and related    Review of Systems   Review of Systems  Physical Exam Updated Vital Signs BP 112/79 (BP Location: Right Arm)   Pulse 66   Temp 98.9 F (37.2 C) (Oral)   Resp 19   Ht 1.803 m (5\' 11" )   Wt 113.4 kg   SpO2 100%   BMI 34.87 kg/m  Physical Exam Vitals and nursing note reviewed.  Constitutional:      Appearance: He is obese.  HENT:     Head: Normocephalic.  Eyes:     Extraocular Movements: Extraocular movements intact.     Pupils: Pupils are equal, round, and reactive to light.  Cardiovascular:     Rate and Rhythm: Normal rate and regular rhythm.     Heart sounds: Normal heart sounds.  Pulmonary:     Effort: Pulmonary effort is normal.     Breath sounds: Normal breath sounds.  Abdominal:     General: Abdomen is protuberant. Bowel sounds are normal.     Palpations: Abdomen is soft.     Tenderness: There is no abdominal tenderness.     Hernia: No hernia is present.  Skin:    General: Skin is warm and dry.     Capillary Refill: Capillary refill takes less than 2 seconds.  Neurological:     General: No focal deficit present.     Mental Status: He is alert.  Psychiatric:        Mood and Affect: Mood normal.     ED Results / Procedures / Treatments   Labs (all labs ordered are listed, but only abnormal results are displayed) Labs Reviewed  COMPREHENSIVE METABOLIC PANEL - Abnormal; Notable for the following components:      Result Value   Glucose, Bld 130 (*)    BUN 27 (*)    All other components within normal limits  CBC - Abnormal; Notable for the following components:   RBC 4.10 (*)    Hemoglobin 11.7 (*)    HCT 36.6 (*)    All other components within normal limits   URINALYSIS, ROUTINE W REFLEX MICROSCOPIC - Abnormal; Notable for the following components:   Hgb urine dipstick SMALL (*)    All other components within normal limits  LIPASE, BLOOD  TROPONIN I (HIGH SENSITIVITY)  TROPONIN I (HIGH SENSITIVITY)    EKG None  Radiology CT CHEST ABDOMEN PELVIS W CONTRAST  Result Date: 01/09/2023 CLINICAL DATA:  Metastatic prostate cancer. Recent chest radiation therapy. Lower chest and upper abdominal pain. * Tracking Code: BO * EXAM: CT CHEST, ABDOMEN,  AND PELVIS WITH CONTRAST TECHNIQUE: Multidetector CT imaging of the chest, abdomen and pelvis was performed following the standard protocol during bolus administration of intravenous contrast. RADIATION DOSE REDUCTION: This exam was performed according to the departmental dose-optimization program which includes automated exposure control, adjustment of the mA and/or kV according to patient size and/or use of iterative reconstruction technique. CONTRAST:  OMNIPAQUE IOHEXOL 300 MG/ML  SOLN COMPARISON:  11/08/2022 Pylarify PET-CT. 06/25/2020 chest CT angiogram. 09/04/2019 CT abdomen/pelvis. FINDINGS: CT CHEST FINDINGS Cardiovascular: Normal heart size. No significant pericardial effusion/thickening. Left anterior descending and left circumflex coronary atherosclerosis. Stable configuration of 2 lead left subclavian pacemaker with lead tips in the right atrium and right ventricular apex. Dilated 4.4 cm ascending thoracic aorta. Normal caliber pulmonary arteries. No central pulmonary emboli. Mediastinum/Nodes: No significant thyroid nodules. Mild circumferential wall thickening throughout the midthoracic esophagus (series 2/image 22), new. No axillary adenopathy. Previously enlarged 2.0 cm right paratracheal node from 11/08/2022 PET-CT is decreased to 0.9 cm. Previously enlarged 1.3 cm right subcarinal node is decreased to 0.9 cm. Mildly enlarged 1.3 cm right hilar node (series 2/image 27), grossly stable although  poorly delineated on the prior noncontrast CT images. No pathologically enlarged mediastinal or left hilar nodes. Lungs/Pleura: No pneumothorax. No pleural effusion. Solid 0.6 cm posterior right upper lobe nodule (series 6/image 38) and solid 0.6 cm superior segment right lower lobe nodule (series 6/image 53), both slightly decreased from 0.8 cm on 11/08/2022 PET-CT. No no acute consolidative airspace disease or new significant pulmonary nodules. Musculoskeletal: No aggressive appearing focal osseous lesions. Moderate thoracic spondylosis. CT ABDOMEN PELVIS FINDINGS Hepatobiliary: Diffuse hepatic steatosis. No liver masses. No definite liver surface irregularity. Cholelithiasis. No gallbladder wall thickening. No biliary ductal dilatation. Pancreas: Normal, with no mass or duct dilation. Spleen: Normal size. No mass. Adrenals/Urinary Tract: Normal adrenals. No hydronephrosis. Subcentimeter hypodense upper renal cortical lesions bilaterally that are too small to characterize, for which no follow-up imaging is recommended. Normal nondistended bladder. Stomach/Bowel: Small hiatal hernia. Otherwise normal nondistended stomach. Normal caliber small bowel with no small bowel wall thickening. Normal appendix. Mild sigmoid diverticulosis with no large bowel wall thickening or significant pericolonic fat stranding. Vascular/Lymphatic: Atherosclerotic nonaneurysmal abdominal aorta. Patent portal, splenic, hepatic and renal veins. No pathologically enlarged lymph nodes in the abdomen or pelvis. Reproductive: Prostatectomy with no evidence of prostatectomy bed recurrent mass. Partially visualized penile prosthesis in place with reservoir in the ventral lateral upper right pelvis. Other: No pneumoperitoneum, ascites or focal fluid collection. Chronic small to moderate fat containing left superior periumbilical ventral abdominal hernia. Musculoskeletal: Large sclerotic right L4 vertebral lesion, unchanged. No new focal osseous  lesions. IMPRESSION: 1. Mild circumferential wall thickening throughout the mid thoracic esophagus, new, nonspecific, probably treatment related esophagitis given the history of interval chest radiation. Small hiatal hernia. 2. Interval positive response to therapy. Mediastinal adenopathy is decreased from 11/08/2022 PET-CT. Mild right hilar adenopathy is stable. Right upper and lower lobe pulmonary nodules are slightly decreased. Stable large sclerotic right L4 vertebral lesion. No new or progressive metastatic disease. 3. Chronic findings include: Diffuse hepatic steatosis. Cholelithiasis. Mild sigmoid diverticulosis. Chronic small to moderate fat containing left superior periumbilical ventral abdominal hernia. Aortic Atherosclerosis (ICD10-I70.0). Electronically Signed   By: Delbert Phenix M.D.   On: 01/09/2023 10:41   DG Chest Port 1 View  Result Date: 01/09/2023 CLINICAL DATA:  Chest pain EXAM: PORTABLE CHEST 1 VIEW COMPARISON:  05/21/2019 chest radiograph. FINDINGS: Stable configuration of 2 lead left subclavian pacemaker. Stable cardiomediastinal silhouette  with normal heart size. No pneumothorax. No pleural effusion. Lungs appear clear, with no acute consolidative airspace disease and no pulmonary edema. IMPRESSION: No active disease. Electronically Signed   By: Delbert Phenix M.D.   On: 01/09/2023 09:27    Procedures Procedures    Medications Ordered in ED Medications  alum & mag hydroxide-simeth (MAALOX/MYLANTA) 200-200-20 MG/5ML suspension 30 mL (30 mLs Oral Given 01/09/23 1027)  lactated ringers bolus 500 mL (0 mLs Intravenous Stopped 01/09/23 1100)  pantoprazole (PROTONIX) injection 40 mg (40 mg Intravenous Given 01/09/23 1029)  iohexol (OMNIPAQUE) 300 MG/ML solution 100 mL (100 mLs Intravenous Contrast Given 01/09/23 2536)    ED Course/ Medical Decision Making/ A&P                                 Medical Decision Making Amount and/or Complexity of Data Reviewed Labs:  ordered. Radiology: ordered.  Risk OTC drugs. Prescription drug management.   72 year old male with known metastatic prostate cancer with recent radiation therapy to chest presents today complaining of difficulty swallowing with pain and burning in chest.  He has been seen by his oncologist and had been started on Carafate and is on Protonix. Differential diagnosis includes but is not limited to multiple etiologies of chest pain and epigastric burning which include esophagitis, acute coronary syndrome including MI, lung etiologies including infection and PE, upper abdominal etiologies including gastritis, cystitis, pancreatitis Based on physical exam, history, and laboratory findings, I doubt acute cholecystitis, acute MI or coronary syndrome, CT was obtained and shows no evidence of PE, no evidence of pericardial effusion, dilated 4.4 ascending thoracic aorta, no central pulmonary emboli Patient has mild circumferential wall thickening throughout the mid thoracic esophagus and a previously enlarged 2 cm right paratracheal node is decreased Lungs without pneumothorax or effusion No aggressive appearing osseous lesions Abdomen reveals gallstones with no gallbladder wall thickening or ductal dilatation there is no tenderness on exam in the right upper quadrant, this makes acute cholecystitis and gallbladder etiologies unlikely Based on above lab evaluations and imaging, patient most consistent with esophagitis  Discussed with Dr. Mitzi Hansen, on-call for Dr. Kathrynn Running he advises he will call in viscous lidocaine       Final Clinical Impression(s) / ED Diagnoses Final diagnoses:  Radiation-induced esophagitis    Rx / DC Orders ED Discharge Orders     None         Margarita Grizzle, MD 01/09/23 1222

## 2023-01-09 NOTE — Discharge Instructions (Signed)
Please continue Protonix, Carafate, and pick up viscous lidocaine Return to the emergency department if you are having any new or worsening symptoms

## 2023-01-09 NOTE — ED Triage Notes (Signed)
Pt arrived via POV. Pt c/o stomach burning after beginning Carafate after rad treatment for 2x days ago.  AOx4

## 2023-01-10 ENCOUNTER — Telehealth: Payer: Self-pay

## 2023-01-10 NOTE — Telephone Encounter (Signed)
RN spoke with [name] who called over the weekend with complaints of abdominal pain, gassy, and reflux.  [name] had recently finished radiation treatment 12/31/2022 for prostate cancer with mets to bone (C7) and chest called 01/06/2023 for complaints of esophagitis and Dr. Kathrynn Running sent order in for Carafate which he started 01/07/2023.  RN explained to [name] that side effects are temporary and that the Carafate is to be taken before meals to prevent stomach discomfort.  [name] explained that the weekend nurse advised him to go to emergency room which he did.  After getting fluids and other tests they gave him Lidocaine solution and a cocktail of Blake Carter/Blake Carter which [name] reports it has helped.  He will continue to take Carafate one hour before meals and also take the Lidocaine/Blake Carter/Blake Carter cocktail as needed.  He also agrees that he will try to increase water intake.  [name] was very appreciative of the call and thanks everyone for the care provided.

## 2023-01-13 MED ORDER — SUCRALFATE 1 G PO TABS: 1.0000 g | ORAL_TABLET | Freq: Three times a day (TID) | ORAL | 2 refills | Status: DC

## 2023-01-13 NOTE — Addendum Note (Signed)
Addended by: Margaretmary Dys on: 01/13/2023 09:59 AM   Modules accepted: Orders

## 2023-01-14 ENCOUNTER — Telehealth: Payer: Self-pay

## 2023-01-14 DIAGNOSIS — E785 Hyperlipidemia, unspecified: Secondary | ICD-10-CM

## 2023-01-14 NOTE — Telephone Encounter (Signed)
Reviewed elevated lipids on labs, offered referral to lipid clinic. Patient verbalizes understanding and agrees to plan.

## 2023-01-14 NOTE — Telephone Encounter (Signed)
-----   Message from Armanda Magic sent at 01/08/2023  3:35 PM EDT ----- Lipids still not at goal.  Please forward to lipid clinic for further recommendations.

## 2023-01-17 ENCOUNTER — Other Ambulatory Visit: Payer: Self-pay | Admitting: Nurse Practitioner

## 2023-01-17 DIAGNOSIS — I10 Essential (primary) hypertension: Secondary | ICD-10-CM

## 2023-01-17 DIAGNOSIS — I2510 Atherosclerotic heart disease of native coronary artery without angina pectoris: Secondary | ICD-10-CM

## 2023-01-17 DIAGNOSIS — I495 Sick sinus syndrome: Secondary | ICD-10-CM

## 2023-01-17 DIAGNOSIS — Z950 Presence of cardiac pacemaker: Secondary | ICD-10-CM

## 2023-01-17 DIAGNOSIS — I480 Paroxysmal atrial fibrillation: Secondary | ICD-10-CM

## 2023-01-17 DIAGNOSIS — I5032 Chronic diastolic (congestive) heart failure: Secondary | ICD-10-CM

## 2023-01-17 NOTE — Telephone Encounter (Signed)
Prescription refill request for Eliquis received. Indication:afib Last office visit:8/24 Scr:1.02  8/24 Age: 72 Weight:113.4  kg  Prescription refilled

## 2023-01-18 ENCOUNTER — Encounter: Payer: Self-pay | Admitting: Cardiology

## 2023-01-18 DIAGNOSIS — C61 Malignant neoplasm of prostate: Secondary | ICD-10-CM | POA: Diagnosis not present

## 2023-02-01 ENCOUNTER — Other Ambulatory Visit: Payer: Self-pay | Admitting: Nurse Practitioner

## 2023-02-01 DIAGNOSIS — I10 Essential (primary) hypertension: Secondary | ICD-10-CM

## 2023-02-01 DIAGNOSIS — I480 Paroxysmal atrial fibrillation: Secondary | ICD-10-CM

## 2023-02-01 DIAGNOSIS — I2510 Atherosclerotic heart disease of native coronary artery without angina pectoris: Secondary | ICD-10-CM

## 2023-02-01 DIAGNOSIS — I495 Sick sinus syndrome: Secondary | ICD-10-CM

## 2023-02-01 DIAGNOSIS — Z950 Presence of cardiac pacemaker: Secondary | ICD-10-CM

## 2023-02-01 DIAGNOSIS — I5032 Chronic diastolic (congestive) heart failure: Secondary | ICD-10-CM

## 2023-02-02 ENCOUNTER — Ambulatory Visit: Payer: Medicare PPO | Attending: Internal Medicine | Admitting: Pharmacist

## 2023-02-02 DIAGNOSIS — E7800 Pure hypercholesterolemia, unspecified: Secondary | ICD-10-CM

## 2023-02-02 DIAGNOSIS — I2510 Atherosclerotic heart disease of native coronary artery without angina pectoris: Secondary | ICD-10-CM

## 2023-02-02 DIAGNOSIS — I480 Paroxysmal atrial fibrillation: Secondary | ICD-10-CM

## 2023-02-02 MED ORDER — LISINOPRIL 40 MG PO TABS: 40.0000 mg | ORAL_TABLET | Freq: Every day | ORAL | 3 refills | Status: DC

## 2023-02-02 MED ORDER — ROSUVASTATIN CALCIUM 40 MG PO TABS
40.0000 mg | ORAL_TABLET | Freq: Every day | ORAL | 3 refills | Status: DC
Start: 2023-02-02 — End: 2023-11-29

## 2023-02-02 MED ORDER — METOPROLOL SUCCINATE ER 100 MG PO TB24
100.0000 mg | ORAL_TABLET | Freq: Every day | ORAL | 3 refills | Status: DC
Start: 2023-02-02 — End: 2024-02-21

## 2023-02-02 NOTE — Patient Instructions (Signed)
Continue taking your current medications.

## 2023-02-02 NOTE — Progress Notes (Unsigned)
Patient ID: Blake Carter                 DOB: January 10, 1951                    MRN: [id number]     HPI: Blake Carter is a 72 y.o. male patient referred to lipid clinic by Dr Mayford Knife. PMH is significant for CAD s/p DES to LCx 05/2014 with residual LAD disease, bradycardia s/p Biotronik dual chamber pacemaker, diastolic CHF, dilated aortic root, PAF, PVCs, HTN, HLD, prostate cancer, GERD, hypothyroidism s/p RAI, and OCD. He underwent cath 07/2020 showing widely patent LCx and LAD stents, normal RCA and LM and diffuse nonobstructive disease of the PDA. There was a 30% stenosis prior to the mid LAD stent and 25-30% in distal LCx. FFR was normal with no evidence of microvascular disease and LVEDP was normal. Medical management was recommended. I have previously followed pt for his BP.  Pt presents today for follow up. He was fasting when his labs were checked recently. He was undergoing radiation for a few weeks leading up to the lab draw. He felt poorly during this time and was missing his cholesterol meds every other day or so. Was eating more sweets. Was also dealing with knee pain so his exercise was limited, has since resumed walking 1 mile.  Lipids previously well controlled on current regimen. Tolerating meds well. Recent A1c checked on 09/28/22 was 5.9%, takes metformin 500mg  daily.  Current Medications: rosuvastatin 40mg  daily, fenofibrate 145mg  daily Intolerances: atorvastatin 80mg  daily - myalgias; niacin - flushing Risk Factors: CAD s/p stenting LDL goal: 70mg /dL   Family History: Father with CABG in his 61s, brother with stenting  Social History: No tobacco or drug use, rare alcohol use.  Labs: 01/07/23: TC 147, TG 166, HDL 42, LDL 77 - rosuvastatin 40mg  daily, fenofibrate 145mg  daily  Past Medical History:  Diagnosis Date   Allergic rhinitis    Aortic atherosclerosis (HCC)    Arthritis    "a little bit in some of my joints" (06/20/2014)   Ascending aorta dilatation (HCC)    43mm by  chest CTA 05/2020 and 42 mm by chest MRI MRA 05/2021   Chronic diastolic CHF (congestive heart failure) (HCC)    Coronary artery disease    a. 05/2014 Cath/PCI: LM nl, LAD 40p, 60-70d (FFR 0.80->3.0x24 Synergy DES), D2 40, LCX 90d (2.25x12 Synergy DES), RCA 20-20m, EF 60-65%. b. Cath 02/2017 without acute change, elev LVEDP suggestive of dCHF.   Depression    Dilated aortic root (HCC)    aortic root at 44mm by Chest CTA 11/2019   ED (erectile dysfunction)    Environmental allergies    dry cough   GERD (gastroesophageal reflux disease)    History of hiatal hernia    Hydronephrosis of right kidney    a. s/p ureteral stenting in the past.   Hyperlipidemia    under control   Hypertension    Hypothyroidism    OCD (obsessive compulsive disorder)    Pacemaker    PAF (paroxysmal atrial fibrillation) (HCC)    Panic disorder    PAT (paroxysmal atrial tachycardia)    PONV (postoperative nausea and vomiting)    after thyroid surgery no problems in 10 years    Prostate cancer (HCC)    prostate cancer- robotic prostatectomy - followed by radiation because of positive lymph node--and pt on lupron injections   PVC's (premature ventricular contractions)    Sinus bradycardia  a. chronic, asymptomatic.  24 hour Holter 10/2016 showed Sinus bradycardia, sinus arrhythmia and normal sinus rhythm with average heart rate 50bpm and heart rate ranged from 32 to 100bpm.   SUI (stress urinary incontinence), male    S/P ROBOTIC PROSTATECTOMY AND RADIATION TX    Current Outpatient Medications on File Prior to Visit  Medication Sig Dispense Refill   acetaminophen (TYLENOL) 500 MG tablet Take 1,000 mg by mouth every 6 (six) hours as needed (pain).     ALPRAZolam (XANAX) 0.25 MG tablet Take 1 tablet (0.25 mg total) by mouth 2 (two) times daily as needed for anxiety. 60 tablet 2   amLODipine (NORVASC) 5 MG tablet Take 1 tablet (5 mg total) by mouth daily. 90 tablet 3   diazepam (VALIUM) 5 MG tablet Take 1 tablet (5  mg total) by mouth daily as needed for anxiety. 30 tablet 2   ELIQUIS 5 MG TABS tablet TAKE 1 TABLET BY MOUTH TWICE A DAY 180 tablet 3   fenofibrate (TRICOR) 145 MG tablet TAKE 1 TABLET BY MOUTH EVERY DAY 90 tablet 3   fluvoxaMINE (LUVOX) 100 MG tablet Take 1 tablet (100 mg total) by mouth 2 (two) times daily. 180 tablet 3   furosemide (LASIX) 20 MG tablet TAKE 1 TABLET BY MOUTH  DAILY AS NEEDED FOR FLUID OR EDEMA 90 tablet 3   latanoprost (XALATAN) 0.005 % ophthalmic solution Place 1 drop into both eyes at bedtime.      leuprolide (LUPRON DEPOT, 73-MONTH,) 30 MG injection Inject 30 mg into the muscle every 6 (six) months.     levothyroxine (SYNTHROID) 137 MCG tablet Take 137 mcg by mouth every morning.     lidocaine (XYLOCAINE) 2 % solution Use as directed 10 mLs in the mouth or throat every 4 (four) hours as needed for mouth pain. 450 mL 1   lisinopril (ZESTRIL) 40 MG tablet Take 1 tablet (40 mg total) by mouth daily. 90 tablet 3   metFORMIN (GLUCOPHAGE-XR) 500 MG 24 hr tablet Take 1 tablet by mouth at bedtime.     metoprolol succinate (TOPROL-XL) 100 MG 24 hr tablet Take 1 tablet (100 mg total) by mouth daily. TAKE WITH OR IMMEDIATELY FOLLOWING A MEAL. 90 tablet 3   MYRBETRIQ 50 MG TB24 tablet Take 50 mg by mouth daily.     nitroGLYCERIN (NITROSTAT) 0.4 MG SL tablet Place 1 tablet (0.4 mg total) under the tongue every 5 (five) minutes as needed for chest pain. 25 tablet 4   oxyCODONE-acetaminophen (PERCOCET/ROXICET) 5-325 MG tablet Take 1 tablet by mouth every 4 (four) hours as needed.     pantoprazole (PROTONIX) 40 MG tablet TAKE 2 TABLETS BY MOUTH EVERY DAY 180 tablet 0   rosuvastatin (CRESTOR) 40 MG tablet Take 1 tablet (40 mg total) by mouth daily. 90 tablet 3   sucralfate (CARAFATE) 1 g tablet Take 1 tablet (1 g total) by mouth 4 (four) times daily -  with meals and at bedtime. 5 min before meals for radiation induced esophagitis 120 tablet 2   sucralfate (CARAFATE) 1 g tablet Take 1 tablet  (1 g total) by mouth 4 (four) times daily -  with meals and at bedtime. 5 min before meals for radiation induced esophagitis 120 tablet 2   traMADol (ULTRAM) 50 MG tablet Take 100 mg by mouth 3 (three) times daily as needed.     No current facility-administered medications on file prior to visit.    Allergies  Allergen Reactions   Cimetidine Other (  See Comments)    Unknown - over 30 years ago (2021) - unsure what occurred it may have been hives or flushing Other reaction(s): flushed   Atorvastatin     Myalgias on 80mg  dose   Hydrocodone Bit-Homatrop Mbr Other (See Comments)    Passed out   Niacin And Related Other (See Comments)    Flushing     Assessment/Plan:  1. Hyperlipidemia - LDL 77 close to goal < 70, TG 166 close to goal < 160 on rosuvastatin 40mg  daily and fenofibrate 145mg  daily. Med compliance wasn't ideal in the few weeks leading up to lab draw secondary to feeling very poorly from radiation. Was eating more sweets during that time and also less physically active secondary to knee pain. Med compliance has since improved and he is walking again. LDL historically in the 40s-50s and TG in the 140s on current regimen, will continue current therapy.  Saunders [place] E. Ashiyah Pavlak, PharmD, BCACP, CPP [facility] [address] [place], [place] [address] Phone: [phone]; Fax: [phone] 02/02/2023 1:47 PM

## 2023-02-08 ENCOUNTER — Ambulatory Visit
Admission: RE | Admit: 2023-02-08 | Discharge: 2023-02-08 | Disposition: A | Discharge status: Home / Self Care | Payer: Medicare PPO | Source: Ambulatory Visit | Attending: Radiation Oncology | Admitting: Radiation Oncology

## 2023-02-08 NOTE — Progress Notes (Signed)
  Radiation Oncology         [phone]) [phone] ________________________________  Name: Blake Carter MRN: [id number]  Date of Service: 02/08/2023  DOB: 03/09/1951  Post Treatment Telephone Note  Diagnosis:  C79.51 Secondary malignant neoplasm of bone (as documented in provider EOT note)   The patient was available for call today.  The patient did  note fatigue during radiation but has improved. The patient did not note skin changes in the field of radiation during therapy. The patient has noticed improvement in pain in the area(s) treated with radiation. The patient is not taking dexamethasone. The patient does not have symptoms of  weakness or loss of control of the extremities. The patient does not have symptoms of headache. The patient does not have symptoms of seizure or uncontrolled movement. The patient does not have symptoms of changes in vision. The patient does not have changes in speech. The patient does not have confusion.   The patient is scheduled for ongoing care on 04/2023 with Dr. Laverle Patter in urology. The patient was encouraged to call if he develops concerns or questions regarding radiation.   This concludes the interaction.  Ruel Favors, LPN

## 2023-02-14 ENCOUNTER — Ambulatory Visit (INDEPENDENT_AMBULATORY_CARE_PROVIDER_SITE_OTHER): Payer: Medicare PPO

## 2023-02-14 DIAGNOSIS — I480 Paroxysmal atrial fibrillation: Secondary | ICD-10-CM

## 2023-02-14 DIAGNOSIS — I495 Sick sinus syndrome: Secondary | ICD-10-CM

## 2023-02-15 LAB — CUP PACEART REMOTE DEVICE CHECK
Date Time Interrogation Session: 20240916100440
Implantable Lead Connection Status: 753985
Implantable Lead Connection Status: 753985
Implantable Lead Implant Date: 20201221
Implantable Lead Implant Date: 20201221
Implantable Lead Location: 753859
Implantable Lead Location: 753860
Implantable Lead Model: 377169
Implantable Lead Model: 377171
Implantable Lead Serial Number: 7000011949
Implantable Lead Serial Number: 81083606
Implantable Pulse Generator Implant Date: 20201221
Pulse Gen Model: 407145
Pulse Gen Serial Number: 69703841

## 2023-03-02 NOTE — Progress Notes (Signed)
Remote pacemaker transmission.   

## 2023-03-03 ENCOUNTER — Other Ambulatory Visit: Payer: Self-pay | Admitting: Cardiology

## 2023-03-07 ENCOUNTER — Telehealth: Payer: Self-pay

## 2023-03-07 ENCOUNTER — Encounter (HOSPITAL_COMMUNITY): Payer: Self-pay

## 2023-03-07 ENCOUNTER — Ambulatory Visit (HOSPITAL_COMMUNITY): Payer: Medicare PPO

## 2023-03-07 ENCOUNTER — Telehealth: Payer: Self-pay | Admitting: Cardiology

## 2023-03-07 DIAGNOSIS — I5032 Chronic diastolic (congestive) heart failure: Secondary | ICD-10-CM

## 2023-03-07 DIAGNOSIS — M1712 Unilateral primary osteoarthritis, left knee: Secondary | ICD-10-CM | POA: Diagnosis not present

## 2023-03-07 NOTE — Telephone Encounter (Signed)
RN spoke with patient who had question about length of time side effects such as fatigue and weakness last after radiation treatment has been completed.  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  Completed treatment on 12/31/2022 with post-treatment call 02/08/2023 with minimal to no side effects (esophagitis) received medication which he states helped no further issues there.  Reports just fatigue and some weakness.  RN advised that radiation side effects could last from a couple months up to year.  He reports he did reach out to his cardiologist in case it's related to his heart is waiting to hear back from that office.  Blake Carter was appreciative of the call and was thankful to all staff that was involved in his care.

## 2023-03-07 NOTE — Telephone Encounter (Signed)
  Pt c/o Shortness Of Breath: STAT if SOB developed within the last 24 hours or pt is noticeably SOB on the phone  1. Are you currently SOB (can you hear that pt is SOB on the phone)? No   2. How long have you been experiencing SOB? Few days ago   3. Are you SOB when sitting or when up moving around? Moving around  4. Are you currently experiencing any other symptoms? Pt said, he believes his CHF is acting up again, he feels weak all the time. During his out of town few days ago, his daughter said he sounded like he has asthma, he's been having difficulty breathing in exertion

## 2023-03-07 NOTE — Telephone Encounter (Signed)
Spoke with patient and he states he has been experiencing SOB with exertion,  fatigue and swelling in his feet. States his SOB has been getting worse. Denies any chest pain.   Advised to monitor salt intake being that he states he has increased his salt intake. Try elevating legs and feet as much as possible. He can also try compression stockings as well. He is not sure if he has gained weight. He will monitor his weight daily and give [place] a call if he gains 3lbs in 24 hours or 5 lbs in a week. He will also monitor BP and HR. Scheduled with APP. Will forward to provider,

## 2023-03-08 ENCOUNTER — Ambulatory Visit: Payer: Medicare PPO | Attending: Cardiology

## 2023-03-08 ENCOUNTER — Other Ambulatory Visit: Payer: Self-pay

## 2023-03-08 ENCOUNTER — Other Ambulatory Visit: Payer: Self-pay | Admitting: *Deleted

## 2023-03-08 DIAGNOSIS — I480 Paroxysmal atrial fibrillation: Secondary | ICD-10-CM

## 2023-03-08 DIAGNOSIS — Z79899 Other long term (current) drug therapy: Secondary | ICD-10-CM | POA: Diagnosis not present

## 2023-03-08 DIAGNOSIS — I5032 Chronic diastolic (congestive) heart failure: Secondary | ICD-10-CM

## 2023-03-08 DIAGNOSIS — I2510 Atherosclerotic heart disease of native coronary artery without angina pectoris: Secondary | ICD-10-CM

## 2023-03-08 DIAGNOSIS — I2583 Coronary atherosclerosis due to lipid rich plaque: Secondary | ICD-10-CM | POA: Diagnosis not present

## 2023-03-08 NOTE — Telephone Encounter (Signed)
Spoke with patient and he will come on for labs today.

## 2023-03-09 ENCOUNTER — Telehealth: Payer: Self-pay

## 2023-03-09 DIAGNOSIS — Z79899 Other long term (current) drug therapy: Secondary | ICD-10-CM

## 2023-03-09 LAB — BASIC METABOLIC PANEL
BUN/Creatinine Ratio: 27 — ABNORMAL HIGH (ref 10–24)
BUN: 31 mg/dL — ABNORMAL HIGH (ref 8–27)
CO2: 19 mmol/L — ABNORMAL LOW (ref 20–29)
Calcium: 9.6 mg/dL (ref 8.6–10.2)
Chloride: 101 mmol/L (ref 96–106)
Creatinine, Ser: 1.15 mg/dL (ref 0.76–1.27)
Glucose: 124 mg/dL — ABNORMAL HIGH (ref 70–99)
Potassium: 4.6 mmol/L (ref 3.5–5.2)
Sodium: 139 mmol/L (ref 134–144)
eGFR: 68 mL/min/{1.73_m2} (ref >59)

## 2023-03-09 LAB — PRO B NATRIURETIC PEPTIDE: NT-Pro BNP: 111 pg/mL (ref 0–376)

## 2023-03-09 NOTE — Telephone Encounter (Signed)
Called patient to discuss BMET and BNP results. Patient states he has been taking his lasix more since 03/03/23 due to going on a cruise recently. Patient was on cruise from 02/14/23-03/04/23, he reports he ate salt, alcohol, prepared food. He noticed around 03/03/23 that his legs were swollen and he was feeling fatigued. He usually uses his 20 mg lasix PRN, but has taken a dose every day since 03/03/23. He states he is feeling much better. His legs are less swollen, he feels better, and he is able to walk his dog 1 mi without SOB. He states he usually walks his dog 2 mi, so he will continue taking the daily lasix until he feels he has totally improved. Patient responses fowarded to Dr. Mayford Knife.

## 2023-03-09 NOTE — Telephone Encounter (Signed)
-----   Message from Nurse Corky Crafts sent at 03/09/2023  9:56 AM EDT -----  ----- Message ----- From: Quintella Reichert, MD Sent: 03/09/2023   9:26 AM EDT To: [facility] Triage  BNp is normal but he is overweigth so it could be falsely low.  Please find out if he has been taking Lasix 20 mg daily or just as needed

## 2023-03-14 NOTE — Addendum Note (Signed)
Addended by: Luellen Pucker on: 03/14/2023 03:23 PM   Modules accepted: Orders

## 2023-03-14 NOTE — Telephone Encounter (Signed)
Call to patient to advise that Dr. Caryl Never like him to continue lasix daily and come in for BMET within a week. Scheduled BMET for 03/18/23.

## 2023-03-18 ENCOUNTER — Ambulatory Visit: Payer: Medicare PPO | Attending: Cardiology

## 2023-03-18 DIAGNOSIS — Z79899 Other long term (current) drug therapy: Secondary | ICD-10-CM

## 2023-03-19 LAB — BASIC METABOLIC PANEL
BUN/Creatinine Ratio: 30 — ABNORMAL HIGH (ref 10–24)
BUN: 31 mg/dL — ABNORMAL HIGH (ref 8–27)
CO2: 21 mmol/L (ref 20–29)
Calcium: 9.5 mg/dL (ref 8.6–10.2)
Chloride: 105 mmol/L (ref 96–106)
Creatinine, Ser: 1.02 mg/dL (ref 0.76–1.27)
Glucose: 116 mg/dL — ABNORMAL HIGH (ref 70–99)
Potassium: 4.1 mmol/L (ref 3.5–5.2)
Sodium: 140 mmol/L (ref 134–144)
eGFR: 79 mL/min/{1.73_m2} (ref >59)

## 2023-03-21 ENCOUNTER — Telehealth: Payer: Self-pay

## 2023-03-21 NOTE — Telephone Encounter (Signed)
-----   Message from Armanda Magic sent at 03/21/2023  8:56 AM EDT ----- Stable labs - continue current meds and forward to PCP

## 2023-03-21 NOTE — Telephone Encounter (Signed)
Call to patient to advise labs are stable. Patient verbalizes understanding to continue current meds and ahs f/u appt w/ T. Asa Lente tomorrow. Labs forwarded to PCP.

## 2023-03-22 ENCOUNTER — Ambulatory Visit: Payer: Medicare PPO | Attending: Physician Assistant | Admitting: Physician Assistant

## 2023-03-22 ENCOUNTER — Encounter: Payer: Self-pay | Admitting: Physician Assistant

## 2023-03-22 VITALS — BP 110/72 | HR 63 | Resp 16 | Ht 71.0 in | Wt 259.6 lb

## 2023-03-22 DIAGNOSIS — I480 Paroxysmal atrial fibrillation: Secondary | ICD-10-CM | POA: Diagnosis not present

## 2023-03-22 DIAGNOSIS — I77810 Thoracic aortic ectasia: Secondary | ICD-10-CM | POA: Diagnosis not present

## 2023-03-22 DIAGNOSIS — E785 Hyperlipidemia, unspecified: Secondary | ICD-10-CM | POA: Diagnosis not present

## 2023-03-22 DIAGNOSIS — I5032 Chronic diastolic (congestive) heart failure: Secondary | ICD-10-CM

## 2023-03-22 DIAGNOSIS — I10 Essential (primary) hypertension: Secondary | ICD-10-CM

## 2023-03-22 DIAGNOSIS — I2510 Atherosclerotic heart disease of native coronary artery without angina pectoris: Secondary | ICD-10-CM | POA: Diagnosis not present

## 2023-03-22 NOTE — Patient Instructions (Signed)
Medication Instructions:  Take your lasix as needed. *If you need a refill on your cardiac medications before your next appointment, please call your pharmacy*  Lab Work: None ordered If you have labs (blood work) drawn today and your tests are completely normal, you will receive your results only by: MyChart Message (if you have MyChart) OR A paper copy in the mail If you have any lab test that is abnormal or we need to change your treatment, we will call you to review the results.  Follow-Up: At [facility], you and your health needs are our priority.  As part of our continuing mission to provide you with exceptional heart care, we have created designated Provider Care Teams.  These Care Teams include your primary Cardiologist (physician) and Advanced Practice Providers (APPs -  Physician Assistants and Nurse Practitioners) who all work together to provide you with the care you need, when you need it.  Your next appointment:   6 month(s)  Provider:   Armanda Magic, MD     Low-Sodium Eating Plan Salt (sodium) helps you keep a healthy balance of fluids in your body. Too much sodium can raise your blood pressure. It can also cause fluid and waste to be held in your body. Your health care provider or dietitian may recommend a low-sodium eating plan if you have high blood pressure (hypertension), kidney disease, liver disease, or heart failure. Eating less sodium can help lower your blood pressure and reduce swelling. It can also protect your heart, liver, and kidneys. What are tips for following this plan? Reading food labels  Check food labels for the amount of sodium per serving. If you eat more than one serving, you must multiply the listed amount by the number of servings. Choose foods with less than 140 milligrams (mg) of sodium per serving. Avoid foods with 300 mg of sodium or more per serving. Always check how much sodium is in a product, even if the label says "unsalted"  or "no salt added." Shopping  Buy products labeled as "low-sodium" or "no salt added." Buy fresh foods. Avoid canned foods and pre-made or frozen meals. Avoid canned, cured, or processed meats. Buy breads that have less than 80 mg of sodium per slice. Cooking  Eat more home-cooked food. Try to eat less restaurant, buffet, and fast food. Try not to add salt when you cook. Use salt-free seasonings or herbs instead of table salt or sea salt. Check with your provider or pharmacist before using salt substitutes. Cook with plant-based oils, such as canola, sunflower, or olive oil. Meal planning When eating at a restaurant, ask if your food can be made with less salt or no salt. Avoid dishes labeled as brined, pickled, cured, or smoked. Avoid dishes made with soy sauce, miso, or teriyaki sauce. Avoid foods that have monosodium glutamate (MSG) in them. MSG may be added to some restaurant food, sauces, soups, bouillon, and canned foods. Make meals that can be grilled, baked, poached, roasted, or steamed. These are often made with less sodium. General information Try to limit your sodium intake to 1,500-2,300 mg each day, or the amount told by your provider. What foods should I eat? Fruits Fresh, frozen, or canned fruit. Fruit juice. Vegetables Fresh or frozen vegetables. "No salt added" canned vegetables. "No salt added" tomato sauce and paste. Low-sodium or reduced-sodium tomato and vegetable juice. Grains Low-sodium cereals, such as oats, puffed wheat and rice, and shredded wheat. Low-sodium crackers. Unsalted rice. Unsalted pasta. Low-sodium bread. Whole  grain breads and whole grain pasta. Meats and other proteins Fresh or frozen meat, poultry, seafood, and fish. These should have no added salt. Low-sodium canned tuna and salmon. Unsalted nuts. Dried peas, beans, and lentils without added salt. Unsalted canned beans. Eggs. Unsalted nut butters. Dairy Milk. Soy milk. Cheese that is naturally  low in sodium, such as ricotta cheese, fresh mozzarella, or Swiss cheese. Low-sodium or reduced-sodium cheese. Cream cheese. Yogurt. Seasonings and condiments Fresh and dried herbs and spices. Salt-free seasonings. Low-sodium mustard and ketchup. Sodium-free salad dressing. Sodium-free light mayonnaise. Fresh or refrigerated horseradish. Lemon juice. Vinegar. Other foods Homemade, reduced-sodium, or low-sodium soups. Unsalted popcorn and pretzels. Low-salt or salt-free chips. The items listed above may not be all the foods and drinks you can have. Talk to a dietitian to learn more. What foods should I avoid? Vegetables Sauerkraut, pickled vegetables, and relishes. Olives. [place] fries. Onion rings. Regular canned vegetables, except low-sodium or reduced-sodium items. Regular canned tomato sauce and paste. Regular tomato and vegetable juice. Frozen vegetables in sauces. Grains Instant hot cereals. Bread stuffing, pancake, and biscuit mixes. Croutons. Seasoned rice or pasta mixes. Noodle soup cups. Boxed or frozen macaroni and cheese. Regular salted crackers. Self-rising flour. Meats and other proteins Meat or fish that is salted, canned, smoked, spiced, or pickled. Precooked or cured meat, such as sausages or meat loaves. Tomasa Blase. Ham. Pepperoni. Hot dogs. Corned beef. Chipped beef. Salt pork. Jerky. Pickled herring, anchovies, and sardines. Regular canned tuna. Salted nuts. Dairy Processed cheese and cheese spreads. Hard cheeses. Cheese curds. Blue cheese. Feta cheese. String cheese. Regular cottage cheese. Buttermilk. Canned milk. Fats and oils Salted butter. Regular margarine. Ghee. Bacon fat. Seasonings and condiments Onion salt, garlic salt, seasoned salt, table salt, and sea salt. Canned and packaged gravies. Worcestershire sauce. Tartar sauce. Barbecue sauce. Teriyaki sauce. Soy sauce, including reduced-sodium soy sauce. Steak sauce. Fish sauce. Oyster sauce. Cocktail sauce. Horseradish that  you find on the shelf. Regular ketchup and mustard. Meat flavorings and tenderizers. Bouillon cubes. Hot sauce. Pre-made or packaged marinades. Pre-made or packaged taco seasonings. Relishes. Regular salad dressings. Salsa. Other foods Salted popcorn and pretzels. Corn chips and puffs. Potato and tortilla chips. Canned or dried soups. Pizza. Frozen entrees and pot pies. The items listed above may not be all the foods and drinks you should avoid. Talk to a dietitian to learn more. This information is not intended to replace advice given to you by your health care provider. Make sure you discuss any questions you have with your health care provider. Document Revised: 06/03/2022 Document Reviewed: 06/03/2022 Elsevier Patient Education  2024 [employer] Heart-Healthy Eating Plan Many factors influence your heart health, including eating and exercise habits. Heart health is also called coronary health. Coronary risk increases with abnormal blood fat (lipid) levels. A heart-healthy eating plan includes limiting unhealthy fats, increasing healthy fats, limiting salt (sodium) intake, and making other diet and lifestyle changes. What is my plan? Your health care provider may recommend that: You limit your fat intake to _________% or less of your total calories each day. You limit your saturated fat intake to _________% or less of your total calories each day. You limit the amount of cholesterol in your diet to less than _________ mg per day. You limit the amount of sodium in your diet to less than _________ mg per day. What are tips for following this plan? Cooking Cook foods using methods other than frying. Baking, boiling, grilling, and broiling are all good options. Other  ways to reduce fat include: Removing the skin from poultry. Removing all visible fats from meats. Steaming vegetables in water or broth. Meal planning  At meals, imagine dividing your plate into fourths: Fill one-half of your  plate with vegetables and green salads. Fill one-fourth of your plate with whole grains. Fill one-fourth of your plate with lean protein foods. Eat 2-4 cups of vegetables per day. One cup of vegetables equals 1 cup (91 g) broccoli or cauliflower florets, 2 medium carrots, 1 large bell pepper, 1 large sweet potato, 1 large tomato, 1 medium white potato, 2 cups (150 g) raw leafy greens. Eat 1-2 cups of fruit per day. One cup of fruit equals 1 small apple, 1 large banana, 1 cup (237 g) mixed fruit, 1 large orange,  cup (82 g) dried fruit, 1 cup (240 mL) 100% fruit juice. Eat more foods that contain soluble fiber. Examples include apples, broccoli, carrots, beans, peas, and barley. Aim to get 25-30 g of fiber per day. Increase your consumption of legumes, nuts, and seeds to 4-5 servings per week. One serving of dried beans or legumes equals  cup (90 g) cooked, 1 serving of nuts is  oz (12 almonds, 24 pistachios, or 7 walnut halves), and 1 serving of seeds equals  oz (8 g). Fats Choose healthy fats more often. Choose monounsaturated and polyunsaturated fats, such as olive and canola oils, avocado oil, flaxseeds, walnuts, almonds, and seeds. Eat more omega-3 fats. Choose salmon, mackerel, sardines, tuna, flaxseed oil, and ground flaxseeds. Aim to eat fish at least 2 times each week. Check food labels carefully to identify foods with trans fats or high amounts of saturated fat. Limit saturated fats. These are found in animal products, such as meats, butter, and cream. Plant sources of saturated fats include palm oil, palm kernel oil, and coconut oil. Avoid foods with partially hydrogenated oils in them. These contain trans fats. Examples are stick margarine, some tub margarines, cookies, crackers, and other baked goods. Avoid fried foods. General information Eat more home-cooked food and less restaurant, buffet, and fast food. Limit or avoid alcohol. Limit foods that are high in added sugar and  simple starches such as foods made using white refined flour (white breads, pastries, sweets). Lose weight if you are overweight. Losing just 5-10% of your body weight can help your overall health and prevent diseases such as diabetes and heart disease. Monitor your sodium intake, especially if you have high blood pressure. Talk with your health care provider about your sodium intake. Try to incorporate more vegetarian meals weekly. What foods should I eat? Fruits All fresh, canned (in natural juice), or frozen fruits. Vegetables Fresh or frozen vegetables (raw, steamed, roasted, or grilled). Green salads. Grains Most grains. Choose whole wheat and whole grains most of the time. Rice and pasta, including brown rice and pastas made with whole wheat. Meats and other proteins Lean, well-trimmed beef, veal, pork, and lamb. Chicken and [place] without skin. All fish and shellfish. Wild duck, rabbit, pheasant, and venison. Egg whites or low-cholesterol egg substitutes. Dried beans, peas, lentils, and tofu. Seeds and most nuts. Dairy Low-fat or nonfat cheeses, including ricotta and mozzarella. Skim or 1% milk (liquid, powdered, or evaporated). Buttermilk made with low-fat milk. Nonfat or low-fat yogurt. Fats and oils Non-hydrogenated (trans-free) margarines. Vegetable oils, including soybean, sesame, sunflower, olive, avocado, peanut, safflower, corn, canola, and cottonseed. Salad dressings or mayonnaise made with a vegetable oil. Beverages Water (mineral or sparkling). Coffee and tea. Unsweetened ice tea. Diet  beverages. Sweets and desserts Sherbet, gelatin, and fruit ice. Small amounts of dark chocolate. Limit all sweets and desserts. Seasonings and condiments All seasonings and condiments. The items listed above may not be a complete list of foods and beverages you can eat. Contact a dietitian for more options. What foods should I avoid? Fruits Canned fruit in heavy syrup. Fruit in cream or  butter sauce. Fried fruit. Limit coconut. Vegetables Vegetables cooked in cheese, cream, or butter sauce. Fried vegetables. Grains Breads made with saturated or trans fats, oils, or whole milk. Croissants. Sweet rolls. Donuts. High-fat crackers, such as cheese crackers and chips. Meats and other proteins Fatty meats, such as hot dogs, ribs, sausage, bacon, rib-eye roast or steak. High-fat deli meats, such as salami and bologna. Caviar. Domestic duck and goose. Organ meats, such as liver. Dairy Cream, sour cream, cream cheese, and creamed cottage cheese. Whole-milk cheeses. Whole or 2% milk (liquid, evaporated, or condensed). Whole buttermilk. Cream sauce or high-fat cheese sauce. Whole-milk yogurt. Fats and oils Meat fat, or shortening. Cocoa butter, hydrogenated oils, palm oil, coconut oil, palm kernel oil. Solid fats and shortenings, including bacon fat, salt pork, lard, and butter. Nondairy cream substitutes. Salad dressings with cheese or sour cream. Beverages Regular sodas and any drinks with added sugar. Sweets and desserts Frosting. Pudding. Cookies. Cakes. Pies. Milk chocolate or white chocolate. Buttered syrups. Full-fat ice cream or ice cream drinks. The items listed above may not be a complete list of foods and beverages to avoid. Contact a dietitian for more information. Summary Heart-healthy meal planning includes limiting unhealthy fats, increasing healthy fats, limiting salt (sodium) intake and making other diet and lifestyle changes. Lose weight if you are overweight. Losing just 5-10% of your body weight can help your overall health and prevent diseases such as diabetes and heart disease. Focus on eating a balance of foods, including fruits and vegetables, low-fat or nonfat dairy, lean protein, nuts and legumes, whole grains, and heart-healthy oils and fats. This information is not intended to replace advice given to you by your health care provider. Make sure you discuss any  questions you have with your health care provider. Document Revised: 06/22/2021 Document Reviewed: 06/22/2021 Elsevier Patient Education  2024 [employer].

## 2023-03-22 NOTE — Progress Notes (Signed)
Cardiology Office Note:  .   Date:  03/22/2023  ID:  Blake Carter, DOB [birth year], MRN [id number] PCP: Daisy Floro, MD  [facility] Providers Cardiologist:  Armanda Magic, MD Electrophysiologist:  Lewayne Bunting, MD {  History of Present Illness: .   Blake Carter is a 72 y.o. male who has a past medical history significant for CAD status post DES to LCx 05/2014 with residual LAD disease, bradycardia status post dual-chamber pacemaker, diastolic CHF, dilated aortic root, PAF, PVCs, HTN, HLD, prostate cancer, GERD, hypothyroidism status post RAI and OCD here for follow-up appointment.  Underwent cardiac cath 2/22 showing widely patent LCx and LAD stents, normal RCA and LM and diffuse nonobstructive disease of the PDA.  30% stenosis prior to the mid LAD stent and 25 to 30% in distal LCx.  FFR was normal with no evidence of microvascular disease and LVEDP was normal.  Medical management was recommended.  He was previously followed for his blood pressure.  He was last seen 02/02/2023 and at that time he underwent recent labs to check his cholesterol where he was fasted.  Underwent radiation for a few weeks leading up to the lab draw.  Felt poorly during this time and was missing his cholesterol meds every other day or so.  Was eating more sweets.  Was also dealing with knee pain so his exercise limited since he resumed walking 1 mile.  Lipids previously well-controlled on current regimen.  Tolerating meds well.  Recent A1c/30/24 was 5.9% and he takes metformin 500 mg daily.  Today, he tells me he feels a lot better than he did when he called to make this appointment.  He just got back from a cruise and enjoyed some salty foods as well as some alcoholic drinks.  Did not take his Lasix for about 10 days and then started taking it while on the boat.  Unfortunately, became very fluid overloaded and having a lot of shortness of breath at that time.  Ankles were very swollen.  He then  continued his Lasix for about a week post cruise and noticed a lot of positive changes.  Shortness of breath got better and swelling improved.  He is unable to do a lot of activity because he has some knee pain but he had a cortisone shot a few weeks ago.  He walked 1.6 miles with the dog yesterday without any issue.  No shortness of breath.  Drinking at least 40 to 60 ounces a day of water including some diet soda.  No coffee.  Tea sometimes when he goes out to eat.  He weighed 259 today but typically weighs less at home.  Discussed daily weights in the morning before breakfast after he uses the bathroom and if he gains 2 pounds overnight or 5 pounds in a week please give [place] phone call.  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  Looked at his recent lab work with B BMP and BNP which looked okay.  History of prostate cancer with radiation and unfortunately has some incontinence from that due to nerve damage  Reports no shortness of breath nor dyspnea on exertion. Reports no chest pain, pressure, or tightness. No edema, orthopnea, PND. Reports no palpitations.    ROS: Pertinent ROS in HPI  Studies Reviewed: .       Cardiac catheterization 07/08/2020 Left Main  Vessel is large. Vessel is angiographically normal.    Left Anterior Descending  Vessel is  normal in caliber. The vessel exhibits minimal luminal irregularities.  Prox LAD to Mid LAD lesion is 30% stenosed.  Mid LAD lesion is 25% stenosed. The lesion is smooth. The lesion was previously treated using a drug eluting stent over 2 years ago. Synergy DES 3.0 mm x 24 mm - 3.5 mm    First Diagonal Branch  Vessel is small in size. Vessel is angiographically normal.    First Septal Branch  Vessel is small in size. Vessel is angiographically normal.    Second Diagonal Branch  Vessel is small in size. Vessel is angiographically normal.    Second Septal Branch  Vessel is small in size. Vessel is angiographically  normal.    Third Diagonal Branch  Vessel is small in size. Vessel is angiographically normal.    Third Septal Branch  Vessel is small in size. Vessel is angiographically normal.    Ramus Intermedius  Vessel is small. Vessel is angiographically normal.    Left Circumflex  Vessel is normal in caliber and small. The vessel exhibits minimal luminal irregularities.  Mid Cx lesion is 35% stenosed.  Mid Cx to Dist Cx lesion is 25% stenosed. The lesion was previously treated.    First Obtuse Marginal Branch  Vessel is small in size. Vessel is angiographically normal.    Lateral Second Obtuse Marginal Branch  Vessel is moderate in size. Vessel is angiographically normal.    Third Obtuse Marginal Branch  Vessel is large in size. Vessel is angiographically normal. Courses as Left Posterolateral  Non-stenotic Ost 3rd Mrg lesion was previously treated. The lesion is smooth. Synergy DES 2.25 mm x 12 mm - 2.5 mm    Lateral Third Obtuse Marginal Branch  Vessel is small in size. Vessel is angiographically normal.    Left Posterior Atrioventricular Artery    Right Coronary Artery  Vessel is large. The vessel exhibits minimal luminal irregularities.  Prox RCA lesion is 10% stenosed. The lesion is tubular and smooth.  Mid RCA lesion is 20% stenosed. The lesion is tubular, concentric and smooth.    Acute Marginal Branch  Vessel is small in size. Vessel is angiographically normal.    Right Posterior Descending Artery  Vessel is moderate in size. There is moderate disease in the vessel.    Inferior Septal  Vessel is small in size. Vessel is angiographically normal.    Right Posterior Atrioventricular Artery  Vessel is small in size. Vessel is angiographically normal.    Intervention   No interventions have been documented.   Right Heart  Right Heart Pressures Hemodynamic findings consistent with pulmonary hypertension. LV EDP is normal.   Wall Motion  Resting                 Left Heart  Left Ventricle The left ventricular size is normal. The left ventricular systolic function is normal. LV end diastolic pressure is normal. The left ventricular ejection fraction is 50-55% by visual estimate. No regional wall motion abnormalities.   Coronary Diagrams  Diagnostic Dominance: Right  Intervention      Physical Exam:   VS:  BP 110/72 (BP Location: Right Arm, Patient Position: Sitting, Cuff Size: Large)   Pulse 63   Resp 16   Ht 5\' 11"  (1.803 m)   Wt 259 lb 9.6 oz (117.8 kg)   SpO2 94%   BMI 36.21 kg/m    Wt Readings from Last 3 Encounters:  03/22/23 259 lb 9.6 oz (117.8 kg)  01/09/23 250 lb (113.4 kg)  01/07/23 249 lb 3.2 oz (113 kg)    GEN: Well nourished, well developed in no acute distress NECK: No JVD; No carotid bruits CARDIAC: RRR, no murmurs, rubs, gallops RESPIRATORY:  Clear to auscultation without rales, wheezing or rhonchi  ABDOMEN: Soft, non-tender, non-distended EXTREMITIES:  No edema; No deformity   ASSESSMENT AND PLAN: .   1.  CAD -No chest pain and shortness of breath improved with diuretics -Continue current medication regimen including amlodipine 5 mg daily, Eliquis 5 mg twice a day, Tricor 145 mg daily, Lasix 20 mg as needed, lisinopril 40 mg daily, metoprolol 100 mg daily, nitro as needed, Crestor 40 mg daily  2.  PAF s/p PPM -no further issues with the Afib -hot and sweaty when he walks the dog but this is likely due to exercise intolerance and not a rhythm issue -Continue current medication regimen  3. Hyperlipidemia -Most recent LDL was 77 (August 2024) he, HDL 42, triglycerides 166 -Continue current medication regimen -Continue low-sodium, heart of the diet -Eventual LDL goal will be less than 70  4.  Hypertension -Blood pressure well-controlled today -Continue current medication regimen  5.  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  Chronic diastolic CHF  -Slight volume overload today but much improved since he has been on Lasix -We have changed him back to as needed and asked him to take daily weights in the morning    Dispo: He can follow-up with Dr. Mayford Knife in 6 months  Signed, Sharlene Dory, PA-C

## 2023-04-05 ENCOUNTER — Ambulatory Visit (HOSPITAL_COMMUNITY): Payer: Medicare PPO

## 2023-04-07 ENCOUNTER — Other Ambulatory Visit: Payer: Self-pay | Admitting: Nurse Practitioner

## 2023-04-08 DIAGNOSIS — C61 Malignant neoplasm of prostate: Secondary | ICD-10-CM | POA: Diagnosis not present

## 2023-04-15 DIAGNOSIS — C7951 Secondary malignant neoplasm of bone: Secondary | ICD-10-CM | POA: Diagnosis not present

## 2023-04-15 DIAGNOSIS — C61 Malignant neoplasm of prostate: Secondary | ICD-10-CM | POA: Diagnosis not present

## 2023-04-15 DIAGNOSIS — N3946 Mixed incontinence: Secondary | ICD-10-CM | POA: Diagnosis not present

## 2023-05-02 DIAGNOSIS — C7951 Secondary malignant neoplasm of bone: Secondary | ICD-10-CM | POA: Diagnosis not present

## 2023-05-02 DIAGNOSIS — C61 Malignant neoplasm of prostate: Secondary | ICD-10-CM | POA: Diagnosis not present

## 2023-05-03 DIAGNOSIS — I10 Essential (primary) hypertension: Secondary | ICD-10-CM | POA: Diagnosis not present

## 2023-05-03 DIAGNOSIS — Z923 Personal history of irradiation: Secondary | ICD-10-CM | POA: Diagnosis not present

## 2023-05-03 DIAGNOSIS — Z6837 Body mass index (BMI) 37.0-37.9, adult: Secondary | ICD-10-CM | POA: Diagnosis not present

## 2023-05-03 DIAGNOSIS — R062 Wheezing: Secondary | ICD-10-CM | POA: Diagnosis not present

## 2023-05-03 DIAGNOSIS — R5381 Other malaise: Secondary | ICD-10-CM | POA: Diagnosis not present

## 2023-05-03 DIAGNOSIS — R635 Abnormal weight gain: Secondary | ICD-10-CM | POA: Diagnosis not present

## 2023-05-05 DIAGNOSIS — R350 Frequency of micturition: Secondary | ICD-10-CM | POA: Diagnosis not present

## 2023-05-05 DIAGNOSIS — N3946 Mixed incontinence: Secondary | ICD-10-CM | POA: Diagnosis not present

## 2023-05-15 ENCOUNTER — Other Ambulatory Visit: Payer: Self-pay | Admitting: Cardiology

## 2023-05-16 ENCOUNTER — Ambulatory Visit: Payer: Medicare PPO

## 2023-05-16 DIAGNOSIS — N3946 Mixed incontinence: Secondary | ICD-10-CM | POA: Diagnosis not present

## 2023-05-16 DIAGNOSIS — I495 Sick sinus syndrome: Secondary | ICD-10-CM | POA: Diagnosis not present

## 2023-05-18 LAB — CUP PACEART REMOTE DEVICE CHECK
Battery Voltage: 70
Date Time Interrogation Session: 20241216075538
Implantable Lead Connection Status: 753985
Implantable Lead Connection Status: 753985
Implantable Lead Implant Date: 20201221
Implantable Lead Implant Date: 20201221
Implantable Lead Location: 753859
Implantable Lead Location: 753860
Implantable Lead Model: 377169
Implantable Lead Model: 377171
Implantable Lead Serial Number: 7000011949
Implantable Lead Serial Number: 81083606
Implantable Pulse Generator Implant Date: 20201221
Pulse Gen Model: 407145
Pulse Gen Serial Number: 69703841

## 2023-06-02 DIAGNOSIS — M1711 Unilateral primary osteoarthritis, right knee: Secondary | ICD-10-CM | POA: Diagnosis not present

## 2023-06-03 DIAGNOSIS — N3946 Mixed incontinence: Secondary | ICD-10-CM | POA: Diagnosis not present

## 2023-06-03 DIAGNOSIS — R350 Frequency of micturition: Secondary | ICD-10-CM | POA: Diagnosis not present

## 2023-06-21 DIAGNOSIS — M5441 Lumbago with sciatica, right side: Secondary | ICD-10-CM | POA: Diagnosis not present

## 2023-06-21 DIAGNOSIS — M545 Low back pain, unspecified: Secondary | ICD-10-CM | POA: Diagnosis not present

## 2023-06-22 ENCOUNTER — Other Ambulatory Visit (HOSPITAL_COMMUNITY): Payer: Self-pay | Admitting: Orthopedic Surgery

## 2023-06-22 DIAGNOSIS — M545 Low back pain, unspecified: Secondary | ICD-10-CM

## 2023-06-22 NOTE — Progress Notes (Signed)
Remote pacemaker transmission.   

## 2023-06-22 NOTE — Addendum Note (Signed)
Addended by: Geralyn Flash D on: 06/22/2023 05:13 PM   Modules accepted: Orders

## 2023-07-04 ENCOUNTER — Encounter: Payer: Self-pay | Admitting: Adult Health

## 2023-07-04 ENCOUNTER — Ambulatory Visit (INDEPENDENT_AMBULATORY_CARE_PROVIDER_SITE_OTHER): Payer: Medicare PPO | Admitting: Adult Health

## 2023-07-04 DIAGNOSIS — F411 Generalized anxiety disorder: Secondary | ICD-10-CM | POA: Diagnosis not present

## 2023-07-04 DIAGNOSIS — F429 Obsessive-compulsive disorder, unspecified: Secondary | ICD-10-CM

## 2023-07-04 DIAGNOSIS — F331 Major depressive disorder, recurrent, moderate: Secondary | ICD-10-CM | POA: Diagnosis not present

## 2023-07-04 MED ORDER — DIAZEPAM 5 MG PO TABS: 5.0000 mg | ORAL_TABLET | Freq: Every day | ORAL | 2 refills | Status: DC | PRN

## 2023-07-04 MED ORDER — FLUVOXAMINE MALEATE 100 MG PO TABS
100.0000 mg | ORAL_TABLET | Freq: Two times a day (BID) | ORAL | 3 refills | Status: DC
Start: 2023-07-04 — End: 2023-11-18

## 2023-07-04 NOTE — Progress Notes (Signed)
Blake Carter [id number] Oct 01, 1950 73 y.o.  Virtual Visit via Telephone Note  I connected with pt on 07/04/23 at 10:00 AM EST by telephone and verified that I am speaking with the correct person using two identifiers.   I discussed the limitations, risks, security and privacy concerns of performing an evaluation and management service by telephone and the availability of in person appointments. I also discussed with the patient that there may be a patient responsible charge related to this service. The patient expressed understanding and agreed to proceed.   I discussed the assessment and treatment plan with the patient. The patient was provided an opportunity to ask questions and all were answered. The patient agreed with the plan and demonstrated an understanding of the instructions.   The patient was advised to call back or seek an in-person evaluation if the symptoms worsen or if the condition fails to improve as anticipated.  I provided 15 minutes of non-face-to-face time during this encounter.  The patient was located at home.  The provider was located at [facility].   Blake Gibbs, NP   Subjective:   Patient ID:  Blake Carter is a 73 y.o. (DOB 1950-12-14) male.  Chief Complaint: No chief complaint on file.   HPI Blake Carter presents for follow-up of MDD, GAD and OCD.  Describes mood today as "ok". Pleasant. Denies tearfulness. Mood symptoms - denies depression, anxiety and irritability. Denies panic attacks. Denies worry, over thinking and rumination. Mood is consistent. Stating "I think I'm doing alright". Feels like Luvox continues to work well for him. Stable interest and motivation. Taking medications as prescribed. Energy levels stable. Active, unable to exercise with current physical disabilities. Enjoys some usual interests and activities. Married. Lives with wife. Has 2 grown children. Spending time with family. Appetite adequate. Weight  stable - 250 pounds. Sleeps well most nights. Averages 8 to 10 hours.  Focus and concentration good. Completing tasks. Managing aspects of household. Retired Pensions consultant and professor.  Denies SI or HI.  Denies AH or VH. Denies self harm. Denies substance use.  Review of Systems:  Review of Systems  Musculoskeletal:  Negative for gait problem.  Neurological:  Negative for tremors.  Psychiatric/Behavioral:         Please refer to HPI    Medications: I have reviewed the patient's current medications.  Current Outpatient Medications  Medication Sig Dispense Refill   acetaminophen (TYLENOL) 500 MG tablet Take 1,000 mg by mouth every 6 (six) hours as needed (pain).     ALPRAZolam (XANAX) 0.25 MG tablet Take 1 tablet (0.25 mg total) by mouth 2 (two) times daily as needed for anxiety. 60 tablet 2   amLODipine (NORVASC) 5 MG tablet Take 1 tablet (5 mg total) by mouth daily. 90 tablet 3   diazepam (VALIUM) 5 MG tablet Take 1 tablet (5 mg total) by mouth daily as needed for anxiety. 30 tablet 2   ELIQUIS 5 MG TABS tablet TAKE 1 TABLET BY MOUTH TWICE A DAY 180 tablet 3   fenofibrate (TRICOR) 145 MG tablet TAKE 1 TABLET BY MOUTH EVERY DAY 90 tablet 3   fluvoxaMINE (LUVOX) 100 MG tablet Take 1 tablet (100 mg total) by mouth 2 (two) times daily. 180 tablet 3   furosemide (LASIX) 20 MG tablet TAKE 1 TABLET BY MOUTH DAILY AS NEEDED FOR FLUID OR EDEMA 90 tablet 3   latanoprost (XALATAN) 0.005 % ophthalmic solution Place 1 drop into both eyes at bedtime.      leuprolide (  LUPRON DEPOT, 55-MONTH,) 30 MG injection Inject 30 mg into the muscle every 6 (six) months.     levothyroxine (SYNTHROID) 137 MCG tablet Take 137 mcg by mouth every morning.     lisinopril (ZESTRIL) 40 MG tablet Take 1 tablet (40 mg total) by mouth daily. 90 tablet 3   metFORMIN (GLUCOPHAGE-XR) 500 MG 24 hr tablet Take 1 tablet by mouth at bedtime.     metoprolol succinate (TOPROL-XL) 100 MG 24 hr tablet Take 1 tablet (100 mg total) by  mouth daily. TAKE WITH OR IMMEDIATELY FOLLOWING A MEAL. 90 tablet 3   MYRBETRIQ 50 MG TB24 tablet Take 50 mg by mouth daily.     nitroGLYCERIN (NITROSTAT) 0.4 MG SL tablet Place 1 tablet (0.4 mg total) under the tongue every 5 (five) minutes as needed for chest pain. 25 tablet 4   oxyCODONE-acetaminophen (PERCOCET/ROXICET) 5-325 MG tablet Take 1 tablet by mouth every 4 (four) hours as needed.     pantoprazole (PROTONIX) 40 MG tablet TAKE 2 TABLETS BY MOUTH EVERY DAY 180 tablet 2   rosuvastatin (CRESTOR) 40 MG tablet Take 1 tablet (40 mg total) by mouth daily. 90 tablet 3   sucralfate (CARAFATE) 1 g tablet Take 1 tablet (1 g total) by mouth 4 (four) times daily -  with meals and at bedtime. 5 min before meals for radiation induced esophagitis 120 tablet 2   sucralfate (CARAFATE) 1 g tablet Take 1 tablet (1 g total) by mouth 4 (four) times daily -  with meals and at bedtime. 5 min before meals for radiation induced esophagitis 120 tablet 2   traMADol (ULTRAM) 50 MG tablet Take 100 mg by mouth 3 (three) times daily as needed.     No current facility-administered medications for this visit.    Medication Side Effects: None  Allergies:  Allergies  Allergen Reactions   Cimetidine Other (See Comments)    Unknown - over 30 years ago (2021) - unsure what occurred it may have been hives or flushing Other reaction(s): flushed   Atorvastatin     Myalgias on 80mg  dose   Hydrocodone Bit-Homatrop Mbr Other (See Comments)    Passed out   Niacin And Related Other (See Comments)    Flushing     Past Medical History:  Diagnosis Date   Allergic rhinitis    Aortic atherosclerosis (HCC)    Arthritis    "a little bit in some of my joints" (06/20/2014)   Ascending aorta dilatation (HCC)    43mm by chest CTA 05/2020 and 42 mm by chest MRI MRA 05/2021   Chronic diastolic CHF (congestive heart failure) (HCC)    Coronary artery disease    a. 05/2014 Cath/PCI: LM nl, LAD 40p, 60-70d (FFR 0.80->3.0x24 Synergy  DES), D2 40, LCX 90d (2.25x12 Synergy DES), RCA 20-23m, EF 60-65%. b. Cath 02/2017 without acute change, elev LVEDP suggestive of dCHF.   Depression    Dilated aortic root (HCC)    aortic root at 44mm by Chest CTA 11/2019   ED (erectile dysfunction)    Environmental allergies    dry cough   GERD (gastroesophageal reflux disease)    History of hiatal hernia    Hydronephrosis of right kidney    a. s/p ureteral stenting in the past.   Hyperlipidemia    under control   Hypertension    Hypothyroidism    OCD (obsessive compulsive disorder)    Pacemaker    PAF (paroxysmal atrial fibrillation) (HCC)    Panic disorder  PAT (paroxysmal atrial tachycardia) (HCC)    PONV (postoperative nausea and vomiting)    after thyroid surgery no problems in 10 years    Prostate cancer (HCC)    prostate cancer- robotic prostatectomy - followed by radiation because of positive lymph node--and pt on lupron injections   PVC's (premature ventricular contractions)    Sinus bradycardia    a. chronic, asymptomatic.  24 hour Holter 10/2016 showed Sinus bradycardia, sinus arrhythmia and normal sinus rhythm with average heart rate 50bpm and heart rate ranged from 32 to 100bpm.   SUI (stress urinary incontinence), male    S/P ROBOTIC PROSTATECTOMY AND RADIATION TX    Family History  Problem Relation Age of Onset   Cancer Father    Hyperlipidemia Father    Heart disease Father        Father had CABG/aorta repair by the time he was in his 32s   CAD Brother        Brother who is 10 years younger has had some sort of stenting    Social History   Socioeconomic History   Marital status: Married    Spouse name: Not on file   Number of children: Not on file   Years of education: Not on file   Highest education level: Not on file  Occupational History   Not on file  Tobacco Use   Smoking status: Never   Smokeless tobacco: Never  Vaping Use   Vaping status: Never Used  Substance and Sexual Activity    Alcohol use: Yes    Alcohol/week: 0.0 standard drinks of alcohol    Comment: 06/20/2014 "1-2 drinks a couple times/month"   Drug use: No   Sexual activity: Yes  Other Topics Concern   Not on file  Social History Narrative   Not on file   Social Drivers of Health   Financial Resource Strain: Not on file  Food Insecurity: No Food Insecurity (11/29/2022)   Hunger Vital Sign    Worried About Running Out of Food in the Last Year: Never true    Ran Out of Food in the Last Year: Never true  Transportation Needs: No Transportation Needs (11/29/2022)   PRAPARE - Administrator, Civil Service (Medical): No    Lack of Transportation (Non-Medical): No  Physical Activity: Not on file  Stress: Not on file  Social Connections: Unknown (10/13/2021)   Received from [facility], [facility]   Social Network    Social Network: Not on file  Intimate Partner Violence: Not At Risk (11/29/2022)   Humiliation, Afraid, Rape, and Kick questionnaire    Fear of Current or Ex-Partner: No    Emotionally Abused: No    Physically Abused: No    Sexually Abused: No    Past Medical History, Surgical history, Social history, and Family history were reviewed and updated as appropriate.   Please see review of systems for further details on the patient's review from today.   Objective:   Physical Exam:  There were no vitals taken for this visit.  Physical Exam Constitutional:      General: He is not in acute distress. Musculoskeletal:        General: No deformity.  Neurological:     Mental Status: He is alert and oriented to person, place, and time.     Coordination: Coordination normal.  Psychiatric:        Attention and Perception: Attention and perception normal. He does not perceive auditory or visual hallucinations.  Mood and Affect: Affect is not labile, blunt, angry or inappropriate.        Speech: Speech normal.        Behavior: Behavior normal.        Thought Content: Thought  content normal. Thought content is not paranoid or delusional. Thought content does not include homicidal or suicidal ideation. Thought content does not include homicidal or suicidal plan.        Cognition and Memory: Cognition and memory normal.        Judgment: Judgment normal.     Comments: Insight intact     Lab Review:     Component Value Date/Time   NA 140 03/18/2023 1151   K 4.1 03/18/2023 1151   CL 105 03/18/2023 1151   CO2 21 03/18/2023 1151   GLUCOSE 116 (H) 03/18/2023 1151   GLUCOSE 130 (H) 01/09/2023 0746   BUN 31 (H) 03/18/2023 1151   CREATININE 1.02 03/18/2023 1151   CREATININE 0.92 10/31/2018 1247   CREATININE 1.07 10/14/2015 0850   CALCIUM 9.5 03/18/2023 1151   PROT 7.7 01/09/2023 0746   PROT 7.0 01/07/2023 0841   ALBUMIN 4.1 01/09/2023 0746   ALBUMIN 4.5 01/07/2023 0841   AST 19 01/09/2023 0746   AST 19 10/31/2018 1247   ALT 20 01/09/2023 0746   ALT 25 10/31/2018 1247   ALKPHOS 42 01/09/2023 0746   BILITOT 0.5 01/09/2023 0746   BILITOT 0.3 01/07/2023 0841   BILITOT 0.3 10/31/2018 1247   GFRNONAA >60 01/09/2023 0746   GFRNONAA >60 10/31/2018 1247   GFRAA 69 07/03/2020 1146   GFRAA >60 10/31/2018 1247       Component Value Date/Time   WBC 4.7 01/09/2023 0746   RBC 4.10 (L) 01/09/2023 0746   HGB 11.7 (L) 01/09/2023 0746   HGB 11.7 (L) 01/07/2023 0841   HCT 36.6 (L) 01/09/2023 0746   HCT 36.2 (L) 01/07/2023 0841   PLT 198 01/09/2023 0746   PLT 205 01/07/2023 0841   MCV 89.3 01/09/2023 0746   MCV 88 01/07/2023 0841   MCH 28.5 01/09/2023 0746   MCHC 32.0 01/09/2023 0746   RDW 14.8 01/09/2023 0746   RDW 13.8 01/07/2023 0841   LYMPHSABS 0.5 (L) 01/07/2023 0841   MONOABS 0.6 12/26/2019 0931   EOSABS 0.1 01/07/2023 0841   BASOSABS 0.0 01/07/2023 0841    No results found for: "POCLITH", "LITHIUM"   No results found for: "PHENYTOIN", "PHENOBARB", "VALPROATE", "CBMZ"   .res Assessment: Plan:    Plan:  PDMP reviewed  Luvox 200mg  daily Valium  5mg  daily  RTC 6 months  15 minutes spent dedicated to the care of this patient on the date of this encounter to include pre-visit review of records, ordering of medication, post visit documentation, and face-to-face time with the patient discussing depression, anxiety and OCD. Discussed continuing current medication regimen.  Patient advised to contact office with any questions, adverse effects, or acute worsening in signs and symptoms.  Discussed potential benefits, risk, and side effects of benzodiazepines to include potential risk of tolerance and dependence, as well as possible drowsiness. Advised patient not to drive if experiencing drowsiness and to take lowest possible effective dose to minimize risk of dependence and tolerance.  There are no diagnoses linked to this encounter.  Please see After Visit Summary for patient specific instructions.  Future Appointments  Date Time Provider Department Center  07/04/2023 10:00 AM Jerilee Space, Thereasa Solo, NP CP-[facility] None  07/08/2023  8:00 AM MC-MR 1 [facility]  07/08/2023  9:00 AM [facility]-MR 1 [facility]  07/08/2023 10:00 AM MC-MR 1 [facility]  08/15/2023  7:05 AM [facility] DEVICE REMOTES [facility]  09/21/2023  9:40 AM Quintella Reichert, MD [facility]  11/14/2023  7:05 AM [facility] DEVICE REMOTES [facility]  02/13/2024  7:15 AM [facility] DEVICE REMOTES [facility]  05/14/2024  7:05 AM [facility] DEVICE REMOTES [facility]  08/13/2024  7:05 AM [facility] DEVICE REMOTES [facility]  11/12/2024  7:05 AM [facility] DEVICE REMOTES [facility]  02/11/2025  7:05 AM [facility] DEVICE REMOTES [facility]  05/13/2025  7:05 AM [facility] DEVICE REMOTES [facility]    No orders of the defined types were placed in this encounter.     -------------------------------

## 2023-07-08 ENCOUNTER — Ambulatory Visit (HOSPITAL_COMMUNITY)
Admission: RE | Admit: 2023-07-08 | Discharge: 2023-07-08 | Disposition: A | Discharge status: Home / Self Care | Payer: Medicare PPO | Source: Ambulatory Visit | Attending: Cardiology | Admitting: Cardiology

## 2023-07-08 ENCOUNTER — Encounter (HOSPITAL_COMMUNITY): Payer: Self-pay

## 2023-07-08 ENCOUNTER — Ambulatory Visit (HOSPITAL_COMMUNITY)
Admission: RE | Admit: 2023-07-08 | Discharge: 2023-07-08 | Disposition: A | Discharge status: Home / Self Care | Payer: Medicare PPO | Source: Ambulatory Visit | Attending: Orthopedic Surgery | Admitting: Orthopedic Surgery

## 2023-07-08 ENCOUNTER — Ambulatory Visit (HOSPITAL_COMMUNITY): Admission: RE | Admit: 2023-07-08 | Payer: Medicare PPO | Source: Ambulatory Visit

## 2023-07-08 DIAGNOSIS — M545 Low back pain, unspecified: Secondary | ICD-10-CM | POA: Diagnosis not present

## 2023-07-08 DIAGNOSIS — Z8546 Personal history of malignant neoplasm of prostate: Secondary | ICD-10-CM | POA: Diagnosis not present

## 2023-07-08 DIAGNOSIS — N3946 Mixed incontinence: Secondary | ICD-10-CM | POA: Diagnosis not present

## 2023-07-08 DIAGNOSIS — I77810 Thoracic aortic ectasia: Secondary | ICD-10-CM | POA: Insufficient documentation

## 2023-07-08 DIAGNOSIS — R350 Frequency of micturition: Secondary | ICD-10-CM | POA: Diagnosis not present

## 2023-07-08 DIAGNOSIS — M549 Dorsalgia, unspecified: Secondary | ICD-10-CM | POA: Diagnosis not present

## 2023-07-08 DIAGNOSIS — M48061 Spinal stenosis, lumbar region without neurogenic claudication: Secondary | ICD-10-CM | POA: Diagnosis not present

## 2023-07-08 DIAGNOSIS — I7121 Aneurysm of the ascending aorta, without rupture: Secondary | ICD-10-CM | POA: Diagnosis not present

## 2023-07-08 DIAGNOSIS — M51369 Other intervertebral disc degeneration, lumbar region without mention of lumbar back pain or lower extremity pain: Secondary | ICD-10-CM | POA: Diagnosis not present

## 2023-07-08 MED ORDER — GADOBUTROL 1 MMOL/ML IV SOLN
10.0000 mL | Freq: Once | INTRAVENOUS | Status: AC | PRN
  Administered 2023-07-08: 10 mL via INTRAVENOUS

## 2023-07-10 ENCOUNTER — Encounter: Payer: Self-pay | Admitting: Cardiology

## 2023-07-11 ENCOUNTER — Telehealth: Payer: Self-pay

## 2023-07-11 DIAGNOSIS — I77810 Thoracic aortic ectasia: Secondary | ICD-10-CM

## 2023-07-11 NOTE — Telephone Encounter (Signed)
 MC message to patient to explain Dr. Micael Adas feels MR chest shows stable aortic enlargement. Chest CTA ordered for 1 year per Dr. Micael Adas.

## 2023-07-11 NOTE — Telephone Encounter (Signed)
-----   Message from Gaylyn Keas sent at 07/10/2023  8:23 PM EST ----- Stable enlargement of the ascending aorta at 4.3cm.   Repeat Chest CTA in 1 year

## 2023-07-15 DIAGNOSIS — C61 Malignant neoplasm of prostate: Secondary | ICD-10-CM | POA: Diagnosis not present

## 2023-07-19 DIAGNOSIS — M1712 Unilateral primary osteoarthritis, left knee: Secondary | ICD-10-CM | POA: Diagnosis not present

## 2023-07-19 DIAGNOSIS — M5441 Lumbago with sciatica, right side: Secondary | ICD-10-CM | POA: Diagnosis not present

## 2023-07-21 ENCOUNTER — Other Ambulatory Visit: Payer: Self-pay

## 2023-07-21 ENCOUNTER — Telehealth: Payer: Self-pay | Admitting: Adult Health

## 2023-07-21 MED ORDER — FLUVOXAMINE MALEATE 100 MG PO TABS: ORAL_TABLET | ORAL | 0 refills | Status: DC

## 2023-07-21 NOTE — Telephone Encounter (Signed)
Sent rx Luvox 100mg  2.5 tablets  (250 mg total) daily for 7 days, then take 3 tablets (300 mg total ) daily.

## 2023-07-21 NOTE — Telephone Encounter (Signed)
 Yes

## 2023-07-21 NOTE — Telephone Encounter (Signed)
PT lvm that he would like an increase on his luvox 200 mg to 300 mg. He is still feeling depressed. His pharmacy is [facility] on [address]. Please contact pt at [phone]

## 2023-07-25 DIAGNOSIS — M48062 Spinal stenosis, lumbar region with neurogenic claudication: Secondary | ICD-10-CM | POA: Diagnosis not present

## 2023-07-28 ENCOUNTER — Encounter: Payer: Self-pay | Admitting: Internal Medicine

## 2023-07-29 ENCOUNTER — Telehealth: Payer: Self-pay | Admitting: *Deleted

## 2023-07-29 NOTE — Telephone Encounter (Signed)
   Pre-operative Risk Assessment    Patient Name: Blake Carter  DOB: [birth year] MRN: [id number]   Date of last office visit: 03/22/23 Jari Favre, [facility] Date of next office visit: 09/21/23 DR. TURNER   Request for Surgical Clearance    Procedure:   L4-5, L5-S1 TFESI  Date of Surgery:  Clearance TBD  (MARCH-APRIL 2025)                              Surgeon:  DR. Claria Dice Surgeon's Group or Practice Name:  [facility] Phone number:  [phone] [facility] Fax number:  [phone]   Type of Clearance Requested:   - Medical  - Pharmacy:  Hold Apixaban (Eliquis)     Type of Anesthesia:  General    Additional requests/questions:    Elpidio Anis   07/29/2023, 3:38 PM

## 2023-08-03 ENCOUNTER — Telehealth: Payer: Self-pay

## 2023-08-03 NOTE — Telephone Encounter (Signed)
 Patient has been scheduled for telephone appointment

## 2023-08-03 NOTE — Telephone Encounter (Signed)
 Patient has been scheduled med rec and consent done     Patient Consent for Virtual Visit         Blake Carter has provided verbal consent on 08/03/2023 for a virtual visit (video or telephone).   CONSENT FOR VIRTUAL VISIT FOR:  Blake Carter  By participating in this virtual visit I agree to the following:  I hereby voluntarily request, consent and authorize [facility] and its employed or contracted physicians, physician assistants, nurse practitioners or other licensed health care professionals (the Practitioner), to provide me with telemedicine health care services (the "Services") as deemed necessary by the treating Practitioner. I acknowledge and consent to receive the Services by the Practitioner via telemedicine. I understand that the telemedicine visit will involve communicating with the Practitioner through live audiovisual communication technology and the disclosure of certain medical information by electronic transmission. I acknowledge that I have been given the opportunity to request an in-person assessment or other available alternative prior to the telemedicine visit and am voluntarily participating in the telemedicine visit.  I understand that I have the right to withhold or withdraw my consent to the use of telemedicine in the course of my care at any time, without affecting my right to future care or treatment, and that the Practitioner or I may terminate the telemedicine visit at any time. I understand that I have the right to inspect all information obtained and/or recorded in the course of the telemedicine visit and may receive copies of available information for a reasonable fee.  I understand that some of the potential risks of receiving the Services via telemedicine include:  Delay or interruption in medical evaluation due to technological equipment failure or disruption; Information transmitted may not be sufficient (e.g. poor resolution of images) to allow  for appropriate medical decision making by the Practitioner; and/or  In rare instances, security protocols could fail, causing a breach of personal health information.  Furthermore, I acknowledge that it is my responsibility to provide information about my medical history, conditions and care that is complete and accurate to the best of my ability. I acknowledge that Practitioner's advice, recommendations, and/or decision may be based on factors not within their control, such as incomplete or inaccurate data provided by me or distortions of diagnostic images or specimens that may result from electronic transmissions. I understand that the practice of medicine is not an exact science and that Practitioner makes no warranties or guarantees regarding treatment outcomes. I acknowledge that a copy of this consent can be made available to me via my patient portal [facility] MyChart), or I can request a printed copy by calling the office of [facility].    I understand that my insurance will be billed for this visit.   I have read or had this consent read to me. I understand the contents of this consent, which adequately explains the benefits and risks of the Services being provided via telemedicine.  I have been provided ample opportunity to ask questions regarding this consent and the Services and have had my questions answered to my satisfaction. I give my informed consent for the services to be provided through the use of telemedicine in my medical care

## 2023-08-03 NOTE — Telephone Encounter (Signed)
 Patient with diagnosis of PAF on Eliquis for anticoagulation.    Procedure: L4-5, L5-S1 TFESI  Date of procedure: TBD   CHA2DS2-VASc Score = 4   This indicates a 4.8% annual risk of stroke. The patient's score is based upon: CHF History: 1 HTN History: 1 Diabetes History: 0 Stroke History: 0 Vascular Disease History: 1 Age Score: 1 Gender Score: 0     CrCl 110 mL/min Platelet count 198 K   Per office protocol, patient can hold Eliquis for 3 days prior to procedure.     **This guidance is not considered finalized until pre-operative APP has relayed final recommendations.**

## 2023-08-03 NOTE — Telephone Encounter (Signed)
   Name: Blake Carter  DOB: [birth year]  MRN: [id number]  Primary Cardiologist: Armanda Magic, MD   Preoperative team, please contact this patient and set up a phone call appointment for further preoperative risk assessment. Please obtain consent and complete medication review. Thank you for your help.  I confirm that guidance regarding antiplatelet and oral anticoagulation therapy has been completed and, if necessary, noted below.  Per office protocol, patient can hold Eliquis for 3 days prior to procedure   I also confirmed the patient resides in the state of [place]. As per [employer] telemedicine laws, the patient must reside in the state in which the provider is licensed.   Denyce Robert, NP 08/03/2023, 2:13 PM [facility]

## 2023-08-04 ENCOUNTER — Ambulatory Visit: Attending: Nurse Practitioner

## 2023-08-04 DIAGNOSIS — Z01810 Encounter for preprocedural cardiovascular examination: Secondary | ICD-10-CM | POA: Diagnosis not present

## 2023-08-04 NOTE — Progress Notes (Signed)
 Virtual Visit via Telephone Note   Because of Blake Carter co-morbid illnesses, he is at least at moderate risk for complications without adequate follow up.  This format is felt to be most appropriate for this patient at this time.  Due to technical limitations with video connection (technology), today's appointment will be conducted as an audio only telehealth visit, and Blake Carter verbally agreed to proceed in this manner.   All issues noted in this document were discussed and addressed.  No physical exam could be performed with this format.  Evaluation Performed:  Preoperative cardiovascular risk assessment _____________   Date:  08/04/2023   Patient ID:  Blake Carter, DOB April 30, 1951, MRN [id number] Patient Location:  Home Provider location:   Office  Primary Care Provider:  Daisy Floro, MD Primary Cardiologist:  Armanda Magic, MD  Chief Complaint / Patient Profile   73 y.o. y/o male with a h/o CAD status post DES to LCx 05/2014 with residual LAD disease, bradycardia status post dual-chamber pacemaker, diastolic CHF, dilated aortic root, PAF, PVCs, HTN, HLD, prostate cancer, GERD, hypothyroidism status post RAI and OCD  who is pending L4-L5 transforaminal ESI and presents today for telephonic preoperative cardiovascular risk assessment.  History of Present Illness    Blake Carter is a 73 y.o. male who presents via audio/video conferencing for a telehealth visit today.  Pt was last seen in cardiology clinic on 03/22/2023 by Blake Favre, PA.  At that time Blake Carter was doing well with improvement to shortness of breath and lower extremity swelling after using Lasix.  Blood pressures were well-controlled and volume status was improved.  He was changed back to as needed Lasix instead of daily.  The patient is now pending procedure as outlined above. Since his last visit, he has been doing well with no new cardiac complaints.  He denies chest pain, shortness of  breath, lower extremity edema, fatigue, palpitations, melena, hematuria, hemoptysis, diaphoresis, weakness, presyncope, syncope, orthopnea, and PND.     Past Medical History    Past Medical History:  Diagnosis Date   Allergic rhinitis    Aortic atherosclerosis (HCC)    Arthritis    "a little bit in some of my joints" (06/20/2014)   Ascending aorta dilatation (HCC)    4.3cm by chest CTA 07/2023   Chronic diastolic CHF (congestive heart failure) (HCC)    Coronary artery disease    a. 05/2014 Cath/PCI: LM nl, LAD 40p, 60-70d (FFR 0.80->3.0x24 Synergy DES), D2 40, LCX 90d (2.25x12 Synergy DES), RCA 20-54m, EF 60-65%. b. Cath 02/2017 without acute change, elev LVEDP suggestive of dCHF.   Depression    Dilated aortic root (HCC)    aortic root at 44mm by Chest CTA 11/2019   ED (erectile dysfunction)    Environmental allergies    dry cough   GERD (gastroesophageal reflux disease)    History of hiatal hernia    Hydronephrosis of right kidney    a. s/p ureteral stenting in the past.   Hyperlipidemia    under control   Hypertension    Hypothyroidism    OCD (obsessive compulsive disorder)    Pacemaker    PAF (paroxysmal atrial fibrillation) (HCC)    Panic disorder    PAT (paroxysmal atrial tachycardia) (HCC)    PONV (postoperative nausea and vomiting)    after thyroid surgery no problems in 10 years    Prostate cancer (HCC)    prostate cancer- robotic prostatectomy - followed by radiation because of positive lymph  node--and pt on lupron injections   PVC's (premature ventricular contractions)    Sinus bradycardia    a. chronic, asymptomatic.  24 hour Holter 10/2016 showed Sinus bradycardia, sinus arrhythmia and normal sinus rhythm with average heart rate 50bpm and heart rate ranged from 32 to 100bpm.   SUI (stress urinary incontinence), male    S/P ROBOTIC PROSTATECTOMY AND RADIATION TX   Past Surgical History:  Procedure Laterality Date   COLONOSCOPY  07/2013   Dr. Randa Evens   CORONARY  ANGIOPLASTY WITH STENT PLACEMENT  06/20/2014   "2"   CORONARY PRESSURE/FFR STUDY N/A 07/08/2020   Procedure: INTRAVASCULAR PRESSURE WIRE/FFR STUDY;  Surgeon: Lyn Records, MD;  Location: [facility] INVASIVE CV LAB;  Service: Cardiovascular;  Laterality: N/A;   CYSTOSCOPY  11/30/2011   Procedure: CYSTOSCOPY;  Surgeon: Martina Sinner, MD;  Location: [facility] OR[facility];  Service: Urology;  Laterality: N/A;  Inplantation of Artificial Sphincter and Cystoscopy   CYSTOSCOPY WITH RETROGRADE PYELOGRAM, URETEROSCOPY AND STENT PLACEMENT Right 02/11/2014   Procedure: CYSTOSCOPY WITH RETROGRADE PYELOGRAM, URETEROSCOPY ,URETERAL BIOPSY AND STENT PLACEMENT;  Surgeon: Heloise Purpura, MD;  Location: [facility];  Service: Urology;  Laterality: Right;   CYSTOSCOPY WITH RETROGRADE PYELOGRAM, URETEROSCOPY AND STENT PLACEMENT Right 04/15/2014   Procedure: CYSTOSCOPY WITH RETROGRADE PYELOGRAM, URETEROSCOPY, BIOPSY AND STENT PLACEMENT;  Surgeon: Heloise Purpura, MD;  Location: [facility];  Service: Urology;  Laterality: Right;   LEFT HEART CATH AND CORONARY ANGIOGRAPHY N/A 03/29/2017   Procedure: LEFT HEART CATH AND CORONARY ANGIOGRAPHY;  Surgeon: Marykay Lex, MD;  Location: [facility] INVASIVE CV LAB;  Service: Cardiovascular;  Laterality: N/A;   LEFT HEART CATHETERIZATION WITH CORONARY ANGIOGRAM N/A 06/20/2014   Procedure: LEFT HEART CATHETERIZATION WITH CORONARY ANGIOGRAM;  Surgeon: Marykay Lex, MD;  Location: [facility] CATH LAB;  Service: Cardiovascular;  Laterality: N/A;   PACEMAKER IMPLANT N/A 05/21/2019   Procedure: PACEMAKER IMPLANT;  Surgeon: Marinus Maw, MD;  Location: [facility] INV[facility] CV [facility];  Service: Cardiovascular;  Laterality: N/A;   PERCUTANEOUS CORONARY STENT INTERVENTION (PCI-S)  06/20/2014   Procedure: PERCUTANEOUS CORONARY STENT INTERVENTION (PCI-S);  Surgeon: Marykay Lex, MD;  Location: [facility] CATH LAB;  Service: Cardiovascular;;  LAD and Circumflex   REFRACTIVE SURGERY Bilateral 2004   RIGHT/LEFT HEART CATH AND CORONARY ANGIOGRAPHY N/A  07/08/2020   Procedure: RIGHT/LEFT HEART CATH AND CORONARY ANGIOGRAPHY;  Surgeon: Lyn Records, MD;  Location: [facility] INVASIVE CV LAB;  Service: Cardiovascular;  Laterality: N/A;   ROBOT ASSISTED LAPAROSCOPIC RADICAL PROSTATECTOMY  05/2010   THYROIDECTOMY, PARTIAL  10/1974   TONSILLECTOMY  1956    TOTAL THYROIDECTOMY  10/2012   "nuked it"   UPPER GI ENDOSCOPY     food impaction 2009 done by Dr Randa Evens   URINARY SPHINCTER IMPLANT  11/30/2011   Procedure: ARTIFICIAL URINARY SPHINCTER;  Surgeon: Martina Sinner, MD;  Location: [facility];  Service: Urology;  Laterality: N/A;    Allergies  Allergies  Allergen Reactions   Cimetidine Other (See Comments)    Unknown - over 30 years ago (2021) - unsure what occurred it may have been hives or flushing Other reaction(s): flushed   Atorvastatin     Myalgias on 80mg  dose   Hydrocodone Bit-Homatrop Mbr Other (See Comments)    Passed out   Niacin And Related Other (See Comments)    Flushing     Home Medications    Prior to Admission medications   Medication Sig Start Date End Date Taking? Authorizing Provider  acetaminophen (TYLENOL) 500 MG  tablet Take 1,000 mg by mouth every 6 (six) hours as needed (pain).    [provider]  ALPRAZolam Prudy Feeler) 0.25 MG tablet Take 1 tablet (0.25 mg total) by mouth 2 (two) times daily as needed for anxiety. 07/17/20   Mozingo, Thereasa Solo, NP  amLODipine (NORVASC) 5 MG tablet Take 1 tablet (5 mg total) by mouth daily. 11/16/22   Marinus Maw, MD  diazepam (VALIUM) 5 MG tablet Take 1 tablet (5 mg total) by mouth daily as needed for anxiety. 07/04/23   Mozingo, Thereasa Solo, NP  ELIQUIS 5 MG TABS tablet TAKE 1 TABLET BY MOUTH TWICE A DAY 01/17/23   Swinyer, Zachary George, NP  fenofibrate (TRICOR) 145 MG tablet TAKE 1 TABLET BY MOUTH EVERY DAY 02/01/23   Quintella Reichert, MD  fluvoxaMINE (LUVOX) 100 MG tablet Take 1 tablet (100 mg total) by mouth 2 (two) times daily. 07/04/23   Mozingo, Thereasa Solo, NP   fluvoxaMINE (LUVOX) 100 MG tablet TAKE 2 1/2 (250 MG TOTAL) TABLETS DAILY FOR 7 DAYS, THEN TAKE 3 TABLETS (300 MG TOTAL) DAILY. 07/21/23   Mozingo, Thereasa Solo, NP  furosemide (LASIX) 20 MG tablet TAKE 1 TABLET BY MOUTH DAILY AS NEEDED FOR FLUID OR EDEMA 04/07/23   Swinyer, Zachary George, NP  latanoprost (XALATAN) 0.005 % ophthalmic solution Place 1 drop into both eyes at bedtime.  03/18/15   [provider]  leuprolide (LUPRON DEPOT, 110-MONTH,) 30 MG injection Inject 30 mg into the muscle every 6 (six) months. 01/04/18   [provider]  levothyroxine (SYNTHROID) 137 MCG tablet Take 137 mcg by mouth every morning. 01/17/22   [provider]  lisinopril (ZESTRIL) 40 MG tablet Take 1 tablet (40 mg total) by mouth daily. 02/02/23 02/03/24  Quintella Reichert, MD  metFORMIN (GLUCOPHAGE-XR) 500 MG 24 hr tablet Take 1 tablet by mouth at bedtime. 07/17/21   [provider]  metoprolol succinate (TOPROL-XL) 100 MG 24 hr tablet Take 1 tablet (100 mg total) by mouth daily. TAKE WITH OR IMMEDIATELY FOLLOWING A MEAL. 02/02/23 02/03/24  Quintella Reichert, MD  MYRBETRIQ 50 MG TB24 tablet Take 50 mg by mouth daily. 02/10/19   [provider]  nitroGLYCERIN (NITROSTAT) 0.4 MG SL tablet Place 1 tablet (0.4 mg total) under the tongue every 5 (five) minutes as needed for chest pain. 01/07/23   Quintella Reichert, MD  oxyCODONE-acetaminophen (PERCOCET/ROXICET) 5-325 MG tablet Take 1 tablet by mouth every 4 (four) hours as needed. 12/08/22   [provider]  pantoprazole (PROTONIX) 40 MG tablet TAKE 2 TABLETS BY MOUTH EVERY DAY 05/17/23   Quintella Reichert, MD  rosuvastatin (CRESTOR) 40 MG tablet Take 1 tablet (40 mg total) by mouth daily. 02/02/23   Quintella Reichert, MD  sucralfate (CARAFATE) 1 g tablet Take 1 tablet (1 g total) by mouth 4 (four) times daily -  with meals and at bedtime. 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 5 min before meals for radiation induced esophagitis 01/07/23   Margaretmary Dys, MD  traMADol (ULTRAM) 50 MG tablet Take 100 mg by mouth 3 (three) times daily as needed. 12/03/22   [provider]    Physical Exam    Vital Signs:  Blake Carter does not have vital signs available for review today.  Given telephonic nature of communication, physical exam is limited. AAOx3. NAD. Normal affect.  Speech and respirations are unlabored.  Accessory Clinical Findings    None  Assessment & Plan    1.  Preoperative Cardiovascular Risk Assessment: Patient's RCRI score is 6.6%  The patient affirms he has been doing well without any new cardiac symptoms. They are able to achieve 6 METS without cardiac limitations. Therefore, based on ACC/AHA guidelines, the patient would be at acceptable risk for the planned procedure without further cardiovascular testing. The patient was advised that if he develops new symptoms prior to surgery to contact our office to arrange for a follow-up visit, and he verbalized understanding.   The patient was advised that if he develops new symptoms prior to surgery to contact our office to arrange for a follow-up visit, and he verbalized understanding.  Per office protocol, patient can hold Eliquis for 3 days prior to procedure   A copy of this note will be routed to requesting surgeon.  Time:   Today, I have spent 9 minutes with the patient with telehealth technology discussing medical history, symptoms, and management plan.     Napoleon Form, Leodis Rains, NP  08/04/2023, 6:54 AM

## 2023-08-13 ENCOUNTER — Other Ambulatory Visit: Payer: Self-pay | Admitting: Adult Health

## 2023-08-23 DIAGNOSIS — J069 Acute upper respiratory infection, unspecified: Secondary | ICD-10-CM | POA: Diagnosis not present

## 2023-08-26 DIAGNOSIS — E890 Postprocedural hypothyroidism: Secondary | ICD-10-CM | POA: Diagnosis not present

## 2023-08-26 DIAGNOSIS — D6869 Other thrombophilia: Secondary | ICD-10-CM | POA: Diagnosis not present

## 2023-08-26 DIAGNOSIS — R7301 Impaired fasting glucose: Secondary | ICD-10-CM | POA: Diagnosis not present

## 2023-08-26 DIAGNOSIS — I10 Essential (primary) hypertension: Secondary | ICD-10-CM | POA: Diagnosis not present

## 2023-08-26 DIAGNOSIS — E559 Vitamin D deficiency, unspecified: Secondary | ICD-10-CM | POA: Diagnosis not present

## 2023-08-26 DIAGNOSIS — E782 Mixed hyperlipidemia: Secondary | ICD-10-CM | POA: Diagnosis not present

## 2023-08-27 ENCOUNTER — Other Ambulatory Visit: Payer: Self-pay | Admitting: Internal Medicine

## 2023-09-01 DIAGNOSIS — Z0000 Encounter for general adult medical examination without abnormal findings: Secondary | ICD-10-CM | POA: Diagnosis not present

## 2023-09-01 DIAGNOSIS — D229 Melanocytic nevi, unspecified: Secondary | ICD-10-CM | POA: Diagnosis not present

## 2023-09-01 DIAGNOSIS — J45909 Unspecified asthma, uncomplicated: Secondary | ICD-10-CM | POA: Diagnosis not present

## 2023-09-01 DIAGNOSIS — C61 Malignant neoplasm of prostate: Secondary | ICD-10-CM | POA: Diagnosis not present

## 2023-09-06 DIAGNOSIS — M5416 Radiculopathy, lumbar region: Secondary | ICD-10-CM | POA: Diagnosis not present

## 2023-09-14 DIAGNOSIS — E119 Type 2 diabetes mellitus without complications: Secondary | ICD-10-CM | POA: Diagnosis not present

## 2023-09-14 DIAGNOSIS — Z9849 Cataract extraction status, unspecified eye: Secondary | ICD-10-CM | POA: Diagnosis not present

## 2023-09-14 DIAGNOSIS — H401131 Primary open-angle glaucoma, bilateral, mild stage: Secondary | ICD-10-CM | POA: Diagnosis not present

## 2023-09-14 DIAGNOSIS — H53143 Visual discomfort, bilateral: Secondary | ICD-10-CM | POA: Diagnosis not present

## 2023-09-15 ENCOUNTER — Telehealth: Payer: Self-pay

## 2023-09-15 DIAGNOSIS — Z1211 Encounter for screening for malignant neoplasm of colon: Secondary | ICD-10-CM | POA: Diagnosis not present

## 2023-09-15 DIAGNOSIS — Z950 Presence of cardiac pacemaker: Secondary | ICD-10-CM | POA: Diagnosis not present

## 2023-09-15 DIAGNOSIS — Z7901 Long term (current) use of anticoagulants: Secondary | ICD-10-CM | POA: Diagnosis not present

## 2023-09-15 DIAGNOSIS — I4891 Unspecified atrial fibrillation: Secondary | ICD-10-CM | POA: Diagnosis not present

## 2023-09-15 NOTE — Telephone Encounter (Signed)
   Name: Blake Carter  DOB: 10/01/1950  MRN: [id number]  Primary Cardiologist: Gaylyn Keas, MD  Chart reviewed as part of pre-operative protocol coverage. The patient has an upcoming visit scheduled with Dr. Micael Adas on 09/21/2023 at which time clearance can be addressed in case there are any issues that would impact surgical recommendations.  I added preop FYI to appointment note so that provider is aware to address at time of outpatient visit.  Per office protocol the cardiology provider should forward their finalized clearance decision and recommendations regarding antiplatelet therapy to the requesting party below.    I will route this message as FYI to requesting party and remove this message from the preop box as separate preop APP input not needed at this time.   Please call with any questions.  Francene Ing, Retha Cast, NP  09/15/2023, 4:59 PM

## 2023-09-15 NOTE — Telephone Encounter (Signed)
   Pre-operative Risk Assessment    Patient Name: Blake Carter  DOB: 11-06-1950 MRN: [id number]   Date of last office visit: 03/22/23 Lovette Rud, PA-C Date of next office visit: 09/21/23 Gaylyn Keas, MD   Request for Surgical Clearance    Procedure:   COLONOSCOPY  Date of Surgery:  Clearance 10/26/23                                Surgeon:  DR Felecia Hopper Surgeon's Group or Practice Name:  [facility] Phone number:  [phone] Fax number:  [phone]   Type of Clearance Requested:   - Medical  - Pharmacy:  Hold Apixaban (Eliquis)     Type of Anesthesia:   PROPOFOL.   Additional requests/questions:    Signed, Collin Deal   09/15/2023, 4:37 PM

## 2023-09-21 ENCOUNTER — Ambulatory Visit: Payer: Medicare PPO | Attending: Cardiology | Admitting: Cardiology

## 2023-09-21 ENCOUNTER — Encounter: Payer: Self-pay | Admitting: Cardiology

## 2023-09-21 VITALS — BP 126/80 | HR 69 | Ht 71.0 in | Wt 258.0 lb

## 2023-09-21 DIAGNOSIS — I480 Paroxysmal atrial fibrillation: Secondary | ICD-10-CM

## 2023-09-21 DIAGNOSIS — I2510 Atherosclerotic heart disease of native coronary artery without angina pectoris: Secondary | ICD-10-CM

## 2023-09-21 DIAGNOSIS — E785 Hyperlipidemia, unspecified: Secondary | ICD-10-CM | POA: Diagnosis not present

## 2023-09-21 DIAGNOSIS — I5032 Chronic diastolic (congestive) heart failure: Secondary | ICD-10-CM | POA: Diagnosis not present

## 2023-09-21 DIAGNOSIS — I10 Essential (primary) hypertension: Secondary | ICD-10-CM

## 2023-09-21 DIAGNOSIS — I77810 Thoracic aortic ectasia: Secondary | ICD-10-CM

## 2023-09-21 LAB — LIPID PANEL
Chol/HDL Ratio: 3.6 ratio (ref 0.0–5.0)
Cholesterol, Total: 128 mg/dL (ref 100–199)
HDL: 36 mg/dL — ABNORMAL LOW (ref >39)
LDL Chol Calc (NIH): 60 mg/dL (ref 0–99)
Triglycerides: 195 mg/dL — ABNORMAL HIGH (ref 0–149)
VLDL Cholesterol Cal: 32 mg/dL (ref 5–40)

## 2023-09-21 LAB — ALT: ALT: 32 IU/L (ref 0–44)

## 2023-09-21 NOTE — Addendum Note (Signed)
 Addended by: Kaisa Wofford N on: 09/21/2023 10:23 AM   Modules accepted: Orders

## 2023-09-21 NOTE — Patient Instructions (Signed)
 Medication Instructions:  Your physician recommends that you continue on your current medications as directed. Please refer to the Current Medication list given to you today.  *If you need a refill on your cardiac medications before your next appointment, please call your pharmacy*  Lab Work: FLP ALT today If you have labs (blood work) drawn today and your tests are completely normal, you will receive your results only by: MyChart Message (if you have MyChart) OR A paper copy in the mail If you have any lab test that is abnormal or we need to change your treatment, we will call you to review the results.  Testing/Procedures: PET/CT Stress Test  Echo Your physician has requested that you have an echocardiogram. Echocardiography is a painless test that uses sound waves to create images of your heart. It provides your doctor with information about the size and shape of your heart and how well your heart's chambers and valves are working. This procedure takes approximately one hour. There are no restrictions for this procedure. Please do NOT wear cologne, perfume, aftershave, or lotions (deodorant is allowed). Please arrive 15 minutes prior to your appointment time.  Please note: We ask at that you not bring children with you during ultrasound (echo/ vascular) testing. Due to room size and safety concerns, children are not allowed in the ultrasound rooms during exams. Our front office staff cannot provide observation of children in our lobby area while testing is being conducted. An adult accompanying a patient to their appointment will only be allowed in the ultrasound room at the discretion of the ultrasound technician under special circumstances. We apologize for any inconvenience.   Follow-Up: At [facility], you and your health needs are our priority.  As part of our continuing mission to provide you with exceptional heart care, our providers are all part of one team.  This team  includes your primary Cardiologist (physician) and Advanced Practice Providers or APPs (Physician Assistants and Nurse Practitioners) who all work together to provide you with the care you need, when you need it.  Your next appointment:   1 year  Provider:   Gaylyn Keas, MD     We recommend signing up for the patient portal called "MyChart".  Sign up information is provided on this After Visit Summary.  MyChart is used to connect with patients for Virtual Visits (Telemedicine).  Patients are able to view lab/test results, encounter notes, upcoming appointments, etc.  Non-urgent messages can be sent to your provider as well.   To learn more about what you can do with MyChart, go to [email or web address].   Other Instructions    Please report to Radiology at the [facility] Main Entrance 30 minutes early for your test.  [address] [place], [place] [address]   How to Prepare for Your Cardiac PET/CT Stress Test:  Nothing to eat or drink, except water , 3 hours prior to arrival time.  NO caffeine/decaffeinated products, or chocolate 12 hours prior to arrival. (Please note decaffeinated beverages (teas/coffees) still contain caffeine).  If you have caffeine within 12 hours prior, the test will need to be rescheduled.  Medication instructions: Do not take erectile dysfunction medications for 72 hours prior to test (sildenafil, tadalafil) Do not take nitrates (isosorbide mononitrate, Ranexa) the day before or day of test Do not take tamsulosin the day before or morning of test Hold theophylline containing medications for 12 hours. Hold Dipyridamole 48 hours prior to the test.  Diabetic Preparation: If able  to eat breakfast prior to 3 hour fasting, you may take all medications, including your insulin. Do not worry if you miss your breakfast dose of insulin - start at your next meal. If you do not eat prior to 3 hour fast-Hold all diabetes (oral and insulin)  medications. Patients who wear a continuous glucose monitor MUST remove the device prior to scanning.  You may take your remaining medications with water .  NO perfume, cologne or lotion on chest or abdomen area. FEMALES - Please avoid wearing dresses to this appointment.  Total time is 1 to 2 hours; you may want to bring reading material for the waiting time.  IF YOU THINK YOU MAY BE PREGNANT, OR ARE NURSING PLEASE INFORM THE TECHNOLOGIST.  In preparation for your appointment, medication and supplies will be purchased.  Appointment availability is limited, so if you need to cancel or reschedule, please call the Radiology Department Scheduler at [phone] 24 hours in advance to avoid a cancellation fee of $100.00  What to Expect When you Arrive:  Once you arrive and check in for your appointment, you will be taken to a preparation room within the Radiology Department.  A technologist or Nurse will obtain your medical history, verify that you are correctly prepped for the exam, and explain the procedure.  Afterwards, an IV will be started in your arm and electrodes will be placed on your skin for EKG monitoring during the stress portion of the exam. Then you will be escorted to the PET/CT scanner.  There, staff will get you positioned on the scanner and obtain a blood pressure and EKG.  During the exam, you will continue to be connected to the EKG and blood pressure machines.  A small, safe amount of a radioactive tracer will be injected in your IV to obtain a series of pictures of your heart along with an injection of a stress agent.    After your Exam:  It is recommended that you eat a meal and drink a caffeinated beverage to counter act any effects of the stress agent.  Drink plenty of fluids for the remainder of the day and urinate frequently for the first couple of hours after the exam.  Your doctor will inform you of your test results within 7-10 business days.  For more information and  frequently asked questions, please visit our website: [email or web address]  For questions about your test or how to prepare for your test, please call: Cardiac Imaging Nurse Navigators Office: [phone]       1st Floor: - Lobby - Registration  - Pharmacy  - Lab - Cafe  2nd Floor: - PV Lab - Diagnostic Testing (echo, CT, nuclear med)  3rd Floor: - Vacant  4th Floor: - TCTS (cardiothoracic surgery) - AFib Clinic - Structural Heart Clinic - Vascular Surgery  - Vascular Ultrasound  5th Floor: - HeartCare Cardiology (general and EP) - Clinical Pharmacy for coumadin, hypertension, lipid, weight-loss medications, and med management appointments    Valet parking services will be available as well.

## 2023-09-21 NOTE — Progress Notes (Addendum)
 Cardiology Office Note    Date:  09/21/2023   ID:  Blake Carter, DOB 1951-05-08, MRN [id number]  PCP:  Jimmey Mould, MD  Cardiologist:  Gaylyn Keas, MD  Electrophysiologist:  Manya Sells, MD   Chief Complaint: f/u PAF, CAD, HTN, HLD  History of Present Illness:   Blake Carter is a 73 y.o. 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  Last cath was in 2018 showing patent stents, moderately elevated LVEDP. He underwent pacemaker implantation in 2020 for progressive bradycardia. Last echo 11/2019 EF 60-65%, grade 1 DD, mild AI (normal valve structure), moderate dilation of ascending aorta (46mm) with CTA showing 4.4cm.  Was seen in the office 01/30/20 with episodic palpitations associated with SOB and chest discomfort. He wore a monitor showing NSR/ST average HR 67bpm, nonsustained atrial tachycardia, intermittent V pacing, occsaional PVCs, bigeminal PVCs, trigeminal PVCs and ventricular couplets and triplets, and wide complex tachycardia up to 5 beats. He was started on metoprolol . Nuclear stress test was normal. It does appear that on pacemaker interrogation on 01/26/20 the patient had actually had an episode of atrial fibrillation (prior to event monitor) therefore he was started on anticoagulation. He had itching with Eliquis  so was changed to Xarelto .  He underwent cath 07/2020 showing widely patent LCx and LAD stents, normal RCA and LM and diffuse nonobstructive disease of the PDA.  There was a 30% stenosis prior to the mid LAD stent and 25-30% in distal LCx.  FFR was normal with no evidence of microvascular disease and LVEDP was normal. Medical management  was recommended.    He is here today for followup and is doing well.  He denies any chest pain or pressure, PND, orthopnea, LE edema, dizziness, palpitations or syncope.   He has been having some SOB which he has had in the past.  Mainly it is when he walks up the stairs and when he gets to the top he feels like he has run a marathon. He has been very inactive due to chronic back pain from spinal stenosis.  He is compliant with his meds and is tolerating meds with no SE.    Past Medical History:  Diagnosis Date   Allergic rhinitis    Aortic atherosclerosis (HCC)    Arthritis    "a little bit in some of my joints" (06/20/2014)   Ascending aorta dilatation (HCC)    4.3cm by chest CTA 07/2023   Chronic diastolic CHF (congestive heart failure) (HCC)    Coronary artery disease    a. 05/2014 Cath/PCI: LM nl, LAD 40p, 60-70d (FFR 0.80->3.0x24 Synergy DES), D2 40, LCX 90d (2.25x12 Synergy DES), RCA 20-64m, EF 60-65%. b. Cath 02/2017 without acute change, elev LVEDP suggestive of dCHF.   Depression    Dilated aortic root (HCC)    aortic root at 44mm by Chest CTA 11/2019   ED (erectile dysfunction)    Environmental allergies    dry cough   GERD (gastroesophageal reflux disease)    History of hiatal hernia    Hydronephrosis of right kidney    a. s/p ureteral stenting in the past.   Hyperlipidemia    under control   Hypertension    Hypothyroidism    OCD (obsessive  compulsive disorder)    Pacemaker    PAF (paroxysmal atrial fibrillation) (HCC)    Panic disorder    PAT (paroxysmal atrial tachycardia) (HCC)    PONV (postoperative nausea and vomiting)    after thyroid  surgery no problems in 10 years    Prostate cancer (HCC)    prostate cancer- robotic prostatectomy - followed by radiation because of positive lymph node--and pt on lupron  injections   PVC's (premature ventricular contractions)    Sinus bradycardia    a. chronic, asymptomatic.  24 hour Holter 10/2016 showed Sinus bradycardia, sinus  arrhythmia and normal sinus rhythm with average heart rate 50bpm and heart rate ranged from 32 to 100bpm.   SUI (stress urinary incontinence), male    S/P ROBOTIC PROSTATECTOMY AND RADIATION TX    Past Surgical History:  Procedure Laterality Date   COLONOSCOPY  07/2013   Dr. Denece Finger   CORONARY ANGIOPLASTY WITH STENT PLACEMENT  06/20/2014   "2"   CORONARY PRESSURE/FFR STUDY N/A 07/08/2020   Procedure: INTRAVASCULAR PRESSURE WIRE/FFR STUDY;  Surgeon: Arty Binning, MD;  Location: [facility] INVASIVE CV LAB;  Service: Cardiovascular;  Laterality: N/A;   CYSTOSCOPY  11/30/2011   Procedure: CYSTOSCOPY;  Surgeon: Devorah Fonder, MD;  Location: [facility] ORS;  Service: Urology;  Laterality: N/A;  Inplantation of Artificial Sphincter and Cystoscopy   CYSTOSCOPY WITH RETROGRADE PYELOGRAM, URETEROSCOPY AND STENT PLACEMENT Right 02/11/2014   Procedure: CYSTOSCOPY WITH RETROGRADE PYELOGRAM, URETEROSCOPY ,URETERAL BIOPSY AND STENT PLACEMENT;  Surgeon: Florencio Hunting, MD;  Location: [facility] ORS;  Service: Urology;  Laterality: Right;   CYSTOSCOPY WITH RETROGRADE PYELOGRAM, URETEROSCOPY AND STENT PLACEMENT Right 04/15/2014   Procedure: CYSTOSCOPY WITH RETROGRADE PYELOGRAM, URETEROSCOPY, BIOPSY AND STENT PLACEMENT;  Surgeon: Florencio Hunting, MD;  Location: [facility];  Service: Urology;  Laterality: Right;   LEFT HEART CATH AND CORONARY ANGIOGRAPHY N/A 03/29/2017   Procedure: LEFT HEART CATH AND CORONARY ANGIOGRAPHY;  Surgeon: Arleen Lacer, MD;  Location: [facility] INVASIVE CV LAB;  Service: Cardiovascular;  Laterality: N/A;   LEFT HEART CATHETERIZATION WITH CORONARY ANGIOGRAM N/A 06/20/2014   Procedure: LEFT HEART CATHETERIZATION WITH CORONARY ANGIOGRAM;  Surgeon: Arleen Lacer, MD;  Location: [facility] CATH LAB;  Service: Cardiovascular;  Laterality: N/A;   PACEMAKER IMPLANT N/A 05/21/2019   Procedure: PACEMAKER IMPLANT;  Surgeon: Tammie Fall, MD;  Location: [facility] INVASIVE CV LAB;  Service: Cardiovascular;  Laterality: N/A;   PERCUTANEOUS  CORONARY STENT INTERVENTION (PCI-S)  06/20/2014   Procedure: PERCUTANEOUS CORONARY STENT INTERVENTION (PCI-S);  Surgeon: Arleen Lacer, MD;  Location: [facility] CATH LAB;  Service: Cardiovascular;;  LAD and Circumflex   REFRACTIVE SURGERY Bilateral 2004   RIGHT/LEFT HEART CATH AND CORONARY ANGIOGRAPHY N/A 07/08/2020   Procedure: RIGHT/LEFT HEART CATH AND CORONARY ANGIOGRAPHY;  Surgeon: Arty Binning, MD;  Location: [facility] INVASIVE CV LAB;  Service: Cardiovascular;  Laterality: N/A;   ROBOT ASSISTED LAPAROSCOPIC RADICAL PROSTATECTOMY  05/2010   THYROIDECTOMY, PARTIAL  10/1974   TONSILLECTOMY  1956    TOTAL THYROIDECTOMY  10/2012   "nuked it"   UPPER GI ENDOSCOPY     food impaction 2009 done by Dr Denece Finger   URINARY SPHINCTER IMPLANT  11/30/2011   Procedure: ARTIFICIAL URINARY SPHINCTER;  Surgeon: Devorah Fonder, MD;  Location: [facility] OR[facility];  Service: Urology;  Laterality: N/A;    Current Medications: Current Meds  Medication Sig   acetaminophen  (TYLENOL ) 500 MG tablet Take 1,000 mg by mouth every 6 (six) hours as needed (pain).   Cholecalciferol (VITAMIN D3) 2000 units  TABS Take 6,000 Units by mouth 3 (three) times a week.    diazepam  (VALIUM ) 5 MG tablet Take 1 tablet (5 mg total) by mouth at bedtime.   fenofibrate  (TRICOR ) 145 MG tablet Take 1 tablet (145 mg total) by mouth daily.   fexofenadine (ALLEGRA) 180 MG tablet Take 180 mg by mouth daily.   fluvoxaMINE  (LUVOX ) 50 MG tablet Take 200 mg by mouth every morning.    furosemide  (LASIX ) 20 MG tablet TAKE 1 TABLET BY MOUTH  DAILY AS NEEDED FOR FLUID  OR EDEMA   latanoprost (XALATAN) 0.005 % ophthalmic solution Place 1 drop into both eyes at bedtime.    leuprolide , 6 Month, (ELIGARD ) 45 MG injection Inject 45 mg into the skin every 6 (six) months.   levothyroxine  (SYNTHROID ) 175 MCG tablet PT TAKES 1 TABLET BY MOUTH DAILY EXCEPT SUNDAYS   lisinopril  (ZESTRIL ) 10 MG tablet Take 1 tablet by mouth daily.   MYRBETRIQ 50 MG TB24 tablet Take 50 mg by mouth  daily.   nitroGLYCERIN  (NITROSTAT ) 0.4 MG SL tablet PLACE 1 TABLET (0.4 MG TOTAL) UNDER THE TONGUE EVERY 5 (FIVE) MINUTES AS NEEDED FOR CHEST PAIN.   pantoprazole  (PROTONIX ) 40 MG tablet Take 2 tablets (80 mg total) by mouth daily.   rivaroxaban  (XARELTO ) 20 MG TABS tablet Take 1 tablet (20 mg total) by mouth daily with supper.   amLODipine  (NORVASC ) 10 MG tablet Take 1 tablet (10 mg total) by mouth every evening.   metoprolol  succinate (TOPROL  XL) 25 MG 24 hr tablet Take 1 tablet (25 mg total) by mouth daily.        Allergies:   Cimetidine, Atorvastatin , Hydrocodone  bit-homatrop mbr, and Niacin and related   Social History   Socioeconomic History   Marital status: Married    Spouse name: Not on file   Number of children: Not on file   Years of education: Not on file   Highest education level: Not on file  Occupational History   Not on file  Tobacco Use   Smoking status: Never   Smokeless tobacco: Never  Vaping Use   Vaping status: Never Used  Substance and Sexual Activity   Alcohol use: Yes    Alcohol/week: 0.0 standard drinks of alcohol    Comment: 06/20/2014 "1-2 drinks a couple times/month"   Drug use: No   Sexual activity: Yes  Other Topics Concern   Not on file  Social History Narrative   Not on file   Social Drivers of Health   Financial Resource Strain: Not on file  Food Insecurity: No Food Insecurity (11/29/2022)   Hunger Vital Sign    Worried About Running Out of Food in the Last Year: Never true    Ran Out of Food in the Last Year: Never true  Transportation Needs: No Transportation Needs (11/29/2022)   PRAPARE - Administrator, Civil Service (Medical): No    Lack of Transportation (Non-Medical): No  Physical Activity: Not on file  Stress: Not on file  Social Connections: Unknown (10/13/2021)   Received [facility]   Social Network    Social Network: Not on file     Family History:  The patient's family history includes  CAD in his brother; Cancer in his father; Heart disease in his father; Hyperlipidemia in his father.  ROS:   Please see the history of present illness. Denies any unusual bleeding on Eliquis . All other systems are reviewed and otherwise negative.    EKGs/Labs/Other Studies  Reviewed:    Studies reviewed are outlined and summarized above. Reports included below if pertinent.  EKG Interpretation Date/Time:  Wednesday September 21 2023 09:51:05 EDT Ventricular Rate:  70 PR Interval:  220 QRS Duration:  76 QT Interval:  398 QTC Calculation: 429 R Axis:   -23  Text Interpretation: Atrial-paced rhythm with prolonged AV conduction Nonspecific ST and T wave abnormality When compared with ECG of 09-Jan-2023 09:37, PREVIOUS ECG IS PRESENT Confirmed by Gaylyn Keas ([id number]) on 09/21/2023 9:53:54 AM    Cardiac Cath 07/08/2020 Conclusion  Widely patent LAD and circumflex stents. Widely patent left main Widely patent circumflex. The left anterior descending contains 30% stenosis proximal to the mid stent.  Hemodynamic assessment was performed with the Pressure Wire X demonstrating an RFR of 0.9 and an FFR of 0.82.  A full hemodynamic assessment to exclude microvascular dysfunction was performed.   Right coronary is widely patent with luminal irregularities.  There is diffuse disease in the PDA but without focal high-grade obstruction. Left ventricular size is normal.  EF is 55%.  EDP is normal. Right heart pressures are normal.   RECOMMENDATIONS:   No evidence of significant epicardial disease or microvascular dysfunction. Continue aggressive risk factor modification. Initiate an exercise program and weight loss.    2D echo 713/21 IMPRESSIONS     1. Left ventricular ejection fraction, by estimation, is 60 to 65%. The  left ventricle has normal function. The left ventricle has no regional  wall motion abnormalities. Left ventricular diastolic parameters are  consistent with Grade I diastolic   dysfunction (impaired relaxation).   2. Right ventricular systolic function is normal. The right ventricular  size is normal. There is normal pulmonary artery systolic pressure.   3. The mitral valve is normal in structure. No evidence of mitral valve  regurgitation. No evidence of mitral stenosis.   4. The aortic valve is normal in structure. Aortic valve regurgitation is  mild. No aortic stenosis is present.   5. Aortic dilatation noted. There is moderate dilatation of the ascending  aorta measuring 46 mm.   6. The inferior vena cava is normal in size with greater than 50%  respiratory variability, suggesting right atrial pressure of 3 mmHg.   Comparison(s): Prior ECHO 42 mm ascending aorta. Current 45 mm. Consider  CTA of aorta for comparison.     Recent Labs: 01/09/2023: ALT 20; Hemoglobin 11.7; Platelets 198 03/08/2023: NT-Pro BNP 111 03/18/2023: BUN 31; Creatinine, Ser 1.02; Potassium 4.1; Sodium 140  Recent Lipid Panel    Component Value Date/Time   CHOL 147 01/07/2023 0841   TRIG 166 (H) 01/07/2023 0841   HDL 42 01/07/2023 0841   CHOLHDL 3.5 01/07/2023 0841   CHOLHDL 3.8 03/29/2017 0554   VLDL 34 03/29/2017 0554   LDLCALC 77 01/07/2023 0841    PHYSICAL EXAM:    VS:  BP 126/80   Pulse 69   Ht 5\' 11"  (1.803 m)   Wt 258 lb (117 kg)   SpO2 96%   BMI 35.98 kg/m   BMI: Body mass index is 35.98 kg/m.  GEN: Well nourished, well developed in no acute distress HEENT: Normal NECK: No JVD; No carotid bruits LYMPHATICS: No lymphadenopathy CARDIAC:RRR, no murmurs, rubs, gallops RESPIRATORY:  Clear to auscultation without rales, wheezing or rhonchi  ABDOMEN: Soft, non-tender, non-distended MUSCULOSKELETAL:  No edema; No deformity  SKIN: Warm and dry NEUROLOGIC:  Alert and oriented x 3 PSYCHIATRIC:  Normal affect  Wt Readings from Last 3 Encounters:  09/21/23  258 lb (117 kg)  03/22/23 259 lb 9.6 oz (117.8 kg)  01/09/23 250 lb (113.4 kg)     ASSESSMENT & PLAN:     Paroxysmal atrial fibrillation  - He is maintaining sinus rhythm on exam today denies any palpitations - No bleeding problems on DOAC  - Continue prescription drug management with Eliquis  5 mg twice daily and Toprol  XL 100 mg daily with as needed refills - I have personally reviewed and interpreted outside labs performed by patient's PCP which showed SCr 1.02 on 03/2023 and Hbg 11.8 in 07/2023 - he is ok to hold Eliquis  for his colonoscopy 48 hours prior  ASCAD  - Cardiac cath for exertional angina 07/2020 showed widely patent LAD and LCX stents with 30% stenosis proximal to mid LAD stent and diffuse nonobstructive disease in the PDA with 25-30% distal LCx , no other CAD and no evidence of microvascular disease.  LVEDP was normal - He has not had any chest pain since I saw him last but has been having DOE when going up stairs >>cannot discern whether this is due obesity and deconditioning from back pain and spinal stenosis or if he has progression of CAD - Continue prescription drug management with Crestor  40 mg daily, Toprol  XL 100 mg - No ASA due to DOAC - due to SOB I will get a stress PET CT -Informed Consent   Shared Decision Making/Informed Consent The risks [chest pain, shortness of breath, cardiac arrhythmias, dizziness, blood pressure fluctuations, myocardial infarction, stroke/transient ischemic attack, nausea, vomiting, allergic reaction, radiation exposure, metallic taste sensation and life-threatening complications (estimated to be 1 in 10,000)], benefits (risk stratification, diagnosing coronary artery disease, treatment guidance) and alternatives of a cardiac PET stress test were discussed in detail with Mr. Chestnut and he agrees to proceed. -check 2D echo to assess LVF    Essential HTN  -Bp controlled on exam today -continue prescription drug management with Amlodipine  5mg  daily, Lisinopril  40mg  daily, Toprol  XL 100mg  daily with PRN refills -I have personally reviewed and  interpreted outside labs performed by patient's PCP which showed SCr 1.02 on 03/18/2023 and K+ 4.3 on 08/26/2023 -continue chlo Hyperlipidemia  -LDL goal < 70 -continue prescription drug management with Crestor  40mg  daily and Fenofibrate  145mg  daily with PRN refills -I have personally reviewed and interpreted outside labs performed by patient's PCP which showed LDL 63, HDL 36 and TAG 221 on 08/26/2023 and ALT 28>>he had just been on a cruise and was not eating well -will repeat FLP and ALT today  Aortic atherosclerosis/Dilated ascending aorta  -41mm by CTA 05/2020 -42mm by chest MRI/MRA 05/2021 -repeat MRI/MRA 07/2022 measuring 42 to 44 mm -repeat MRI/MRA 07/2023 measuring 43mm -repeat chest MRI 07/2024 -continue statin -BP is controlled  Chronic diastolic CHF  -appears euvolemic on exam today -LVEPD was normal on cath  -only has to use Lasix  sporadically.   SSS/Bradycardia -s/p PPM followed by Dr. Carolynne Citron  Followup with me in 1 year   Signed, Gaylyn Keas, MD  09/21/2023 9:54 AM    [facility] [address] [place], [place], [place]  [address] Phone: [phone]; Fax: [phone]

## 2023-09-22 NOTE — Telephone Encounter (Signed)
-----   Message from Gaylyn Keas sent at 09/22/2023  9:28 AM EDT ----- Lipids still not at goal.  Please forward to lipid clinic for further recommendations.

## 2023-09-22 NOTE — Telephone Encounter (Signed)
 This encounter was created in error - please disregard.

## 2023-09-27 DIAGNOSIS — M48062 Spinal stenosis, lumbar region with neurogenic claudication: Secondary | ICD-10-CM | POA: Diagnosis not present

## 2023-09-28 DIAGNOSIS — L814 Other melanin hyperpigmentation: Secondary | ICD-10-CM | POA: Diagnosis not present

## 2023-09-28 DIAGNOSIS — L720 Epidermal cyst: Secondary | ICD-10-CM | POA: Diagnosis not present

## 2023-09-28 DIAGNOSIS — D1722 Benign lipomatous neoplasm of skin and subcutaneous tissue of left arm: Secondary | ICD-10-CM | POA: Diagnosis not present

## 2023-09-28 DIAGNOSIS — L918 Other hypertrophic disorders of the skin: Secondary | ICD-10-CM | POA: Diagnosis not present

## 2023-09-28 DIAGNOSIS — D1801 Hemangioma of skin and subcutaneous tissue: Secondary | ICD-10-CM | POA: Diagnosis not present

## 2023-09-28 DIAGNOSIS — L821 Other seborrheic keratosis: Secondary | ICD-10-CM | POA: Diagnosis not present

## 2023-09-28 NOTE — Addendum Note (Signed)
 Addended by: Jacqueline Matsu on: 09/28/2023 09:29 AM   Modules accepted: Orders

## 2023-10-03 NOTE — Telephone Encounter (Signed)
 Dr. Micael Adas, can you please clarify whether PET and echo are needed prior to colonoscopy which is scheduled for 10/26/23.  Please send your response to p cv div preop.  Thank you, Moira Andrews

## 2023-10-03 NOTE — Telephone Encounter (Signed)
 Requesting office sent duplicate inquiring if pt has been cleared as well for notes to reflect when to stop Eliquis  prior and when to start post op. I will forward to preop APP for review. Pt recently seen by Dr. Micael Adas.

## 2023-10-03 NOTE — Telephone Encounter (Signed)
   Primary Cardiologist: Gaylyn Keas, MD  Chart reviewed as part of pre-operative protocol coverage. Given past medical history and time since last visit, based on ACC/AHA guidelines, Alyn Loken would be at acceptable risk for the planned procedure without further cardiovascular testing.   Patient was advised that if he develops new symptoms prior to surgery to contact our office to arrange a follow-up appointment.  He verbalized understanding.  Per office protocol, he may hold Eliquis  for 48 hours prior to colonoscopy and should resume as soon as hemodynamically stable post procedure.  I will route this recommendation to the requesting party via Epic fax function and remove from pre-op pool.  Please call with questions.  Gerldine Koch, NP-C 10/03/2023, 11:29 AM [address] [place], [place] [address] Office [phone] Fax [phone]

## 2023-10-04 DIAGNOSIS — R7309 Other abnormal glucose: Secondary | ICD-10-CM | POA: Diagnosis not present

## 2023-10-04 DIAGNOSIS — E890 Postprocedural hypothyroidism: Secondary | ICD-10-CM | POA: Diagnosis not present

## 2023-10-05 ENCOUNTER — Encounter: Payer: Self-pay | Admitting: Internal Medicine

## 2023-10-11 DIAGNOSIS — E669 Obesity, unspecified: Secondary | ICD-10-CM | POA: Diagnosis not present

## 2023-10-11 DIAGNOSIS — R7309 Other abnormal glucose: Secondary | ICD-10-CM | POA: Diagnosis not present

## 2023-10-11 DIAGNOSIS — E7800 Pure hypercholesterolemia, unspecified: Secondary | ICD-10-CM | POA: Diagnosis not present

## 2023-10-11 DIAGNOSIS — E890 Postprocedural hypothyroidism: Secondary | ICD-10-CM | POA: Diagnosis not present

## 2023-10-11 DIAGNOSIS — C61 Malignant neoplasm of prostate: Secondary | ICD-10-CM | POA: Diagnosis not present

## 2023-10-11 DIAGNOSIS — I2510 Atherosclerotic heart disease of native coronary artery without angina pectoris: Secondary | ICD-10-CM | POA: Diagnosis not present

## 2023-10-11 DIAGNOSIS — I10 Essential (primary) hypertension: Secondary | ICD-10-CM | POA: Diagnosis not present

## 2023-10-19 DIAGNOSIS — N3946 Mixed incontinence: Secondary | ICD-10-CM | POA: Diagnosis not present

## 2023-10-19 DIAGNOSIS — C61 Malignant neoplasm of prostate: Secondary | ICD-10-CM | POA: Diagnosis not present

## 2023-10-19 DIAGNOSIS — C7951 Secondary malignant neoplasm of bone: Secondary | ICD-10-CM | POA: Diagnosis not present

## 2023-10-20 DIAGNOSIS — J45909 Unspecified asthma, uncomplicated: Secondary | ICD-10-CM | POA: Diagnosis not present

## 2023-10-20 DIAGNOSIS — Z6838 Body mass index (BMI) 38.0-38.9, adult: Secondary | ICD-10-CM | POA: Diagnosis not present

## 2023-10-20 DIAGNOSIS — R059 Cough, unspecified: Secondary | ICD-10-CM | POA: Diagnosis not present

## 2023-10-26 DIAGNOSIS — K5730 Diverticulosis of large intestine without perforation or abscess without bleeding: Secondary | ICD-10-CM | POA: Diagnosis not present

## 2023-10-26 DIAGNOSIS — D175 Benign lipomatous neoplasm of intra-abdominal organs: Secondary | ICD-10-CM | POA: Diagnosis not present

## 2023-10-26 DIAGNOSIS — Z1211 Encounter for screening for malignant neoplasm of colon: Secondary | ICD-10-CM | POA: Diagnosis not present

## 2023-10-31 DIAGNOSIS — H26491 Other secondary cataract, right eye: Secondary | ICD-10-CM | POA: Diagnosis not present

## 2023-10-31 DIAGNOSIS — H401131 Primary open-angle glaucoma, bilateral, mild stage: Secondary | ICD-10-CM | POA: Diagnosis not present

## 2023-10-31 DIAGNOSIS — I10 Essential (primary) hypertension: Secondary | ICD-10-CM | POA: Diagnosis not present

## 2023-10-31 DIAGNOSIS — Z961 Presence of intraocular lens: Secondary | ICD-10-CM | POA: Diagnosis not present

## 2023-10-31 DIAGNOSIS — H26493 Other secondary cataract, bilateral: Secondary | ICD-10-CM | POA: Diagnosis not present

## 2023-11-01 ENCOUNTER — Ambulatory Visit: Payer: Self-pay | Admitting: Cardiology

## 2023-11-01 ENCOUNTER — Encounter: Payer: Self-pay | Admitting: Cardiology

## 2023-11-01 ENCOUNTER — Ambulatory Visit (HOSPITAL_COMMUNITY)
Admission: RE | Admit: 2023-11-01 | Discharge: 2023-11-01 | Disposition: A | Discharge status: Home / Self Care | Source: Ambulatory Visit | Attending: Cardiology | Admitting: Cardiology

## 2023-11-01 DIAGNOSIS — I77810 Thoracic aortic ectasia: Secondary | ICD-10-CM | POA: Diagnosis not present

## 2023-11-01 DIAGNOSIS — E785 Hyperlipidemia, unspecified: Secondary | ICD-10-CM | POA: Insufficient documentation

## 2023-11-01 DIAGNOSIS — I5032 Chronic diastolic (congestive) heart failure: Secondary | ICD-10-CM | POA: Diagnosis not present

## 2023-11-01 DIAGNOSIS — I10 Essential (primary) hypertension: Secondary | ICD-10-CM | POA: Diagnosis not present

## 2023-11-01 DIAGNOSIS — R0602 Shortness of breath: Secondary | ICD-10-CM | POA: Diagnosis not present

## 2023-11-01 DIAGNOSIS — I480 Paroxysmal atrial fibrillation: Secondary | ICD-10-CM | POA: Insufficient documentation

## 2023-11-01 DIAGNOSIS — I2510 Atherosclerotic heart disease of native coronary artery without angina pectoris: Secondary | ICD-10-CM | POA: Insufficient documentation

## 2023-11-01 LAB — ECHOCARDIOGRAM COMPLETE
Area-P 1/2: 2.5 cm2
Est EF: 55
P 1/2 time: 711 ms
S' Lateral: 3 cm

## 2023-11-07 DIAGNOSIS — H2511 Age-related nuclear cataract, right eye: Secondary | ICD-10-CM | POA: Diagnosis not present

## 2023-11-07 DIAGNOSIS — Z961 Presence of intraocular lens: Secondary | ICD-10-CM | POA: Diagnosis not present

## 2023-11-07 DIAGNOSIS — M5416 Radiculopathy, lumbar region: Secondary | ICD-10-CM | POA: Diagnosis not present

## 2023-11-07 DIAGNOSIS — Z9841 Cataract extraction status, right eye: Secondary | ICD-10-CM | POA: Diagnosis not present

## 2023-11-09 DIAGNOSIS — N3946 Mixed incontinence: Secondary | ICD-10-CM | POA: Diagnosis not present

## 2023-11-09 DIAGNOSIS — R350 Frequency of micturition: Secondary | ICD-10-CM | POA: Diagnosis not present

## 2023-11-14 ENCOUNTER — Ambulatory Visit: Payer: Medicare PPO

## 2023-11-14 DIAGNOSIS — I480 Paroxysmal atrial fibrillation: Secondary | ICD-10-CM

## 2023-11-14 DIAGNOSIS — I495 Sick sinus syndrome: Secondary | ICD-10-CM

## 2023-11-14 LAB — CUP PACEART REMOTE DEVICE CHECK
Battery Voltage: 65
Date Time Interrogation Session: 20250616113643
Implantable Lead Connection Status: 753985
Implantable Lead Connection Status: 753985
Implantable Lead Implant Date: 20201221
Implantable Lead Implant Date: 20201221
Implantable Lead Location: 753859
Implantable Lead Location: 753860
Implantable Lead Model: 377169
Implantable Lead Model: 377171
Implantable Lead Serial Number: 7000011949
Implantable Lead Serial Number: 81083606
Implantable Pulse Generator Implant Date: 20201221
Pulse Gen Model: 407145
Pulse Gen Serial Number: 69703841

## 2023-11-16 ENCOUNTER — Ambulatory Visit: Payer: Self-pay | Admitting: Internal Medicine

## 2023-11-18 ENCOUNTER — Ambulatory Visit: Attending: Cardiology | Admitting: Pharmacist

## 2023-11-18 ENCOUNTER — Encounter: Payer: Self-pay | Admitting: Pharmacist

## 2023-11-18 DIAGNOSIS — J45909 Unspecified asthma, uncomplicated: Secondary | ICD-10-CM | POA: Insufficient documentation

## 2023-11-18 DIAGNOSIS — E785 Hyperlipidemia, unspecified: Secondary | ICD-10-CM | POA: Diagnosis not present

## 2023-11-18 DIAGNOSIS — D6859 Other primary thrombophilia: Secondary | ICD-10-CM | POA: Insufficient documentation

## 2023-11-18 DIAGNOSIS — I712 Thoracic aortic aneurysm, without rupture, unspecified: Secondary | ICD-10-CM | POA: Insufficient documentation

## 2023-11-18 DIAGNOSIS — H401130 Primary open-angle glaucoma, bilateral, stage unspecified: Secondary | ICD-10-CM | POA: Insufficient documentation

## 2023-11-18 DIAGNOSIS — I480 Paroxysmal atrial fibrillation: Secondary | ICD-10-CM | POA: Insufficient documentation

## 2023-11-18 DIAGNOSIS — D649 Anemia, unspecified: Secondary | ICD-10-CM | POA: Insufficient documentation

## 2023-11-18 DIAGNOSIS — E7800 Pure hypercholesterolemia, unspecified: Secondary | ICD-10-CM

## 2023-11-18 DIAGNOSIS — H35039 Hypertensive retinopathy, unspecified eye: Secondary | ICD-10-CM | POA: Insufficient documentation

## 2023-11-18 DIAGNOSIS — F429 Obsessive-compulsive disorder, unspecified: Secondary | ICD-10-CM | POA: Insufficient documentation

## 2023-11-18 DIAGNOSIS — E559 Vitamin D deficiency, unspecified: Secondary | ICD-10-CM | POA: Insufficient documentation

## 2023-11-18 DIAGNOSIS — E782 Mixed hyperlipidemia: Secondary | ICD-10-CM | POA: Insufficient documentation

## 2023-11-18 DIAGNOSIS — I77819 Aortic ectasia, unspecified site: Secondary | ICD-10-CM | POA: Insufficient documentation

## 2023-11-18 NOTE — Patient Instructions (Signed)
 Blake Carter

## 2023-11-18 NOTE — Progress Notes (Signed)
 Patient ID: Blake Carter                 DOB: 12/10/1950                    MRN: [id number]     HPI: Blake Carter is a 73 y.o. male patient referred to lipid clinic by Dr Micael Adas. PMH is significant for HTN, angina, CAD, CHF, and a history of prostate cancer.  Patient presents today to discuss cholesterol and triglyceride management. LDL well controlled on rosuvastatin  40mg .    Currently on fenofibrate  145mg  once daily for triglyceride management. Trigs have typically been above goal. Currently 195. Has cut out sugars and only drinks alcohol on vacations which occur about twice a year. Ranged from 124-195 over last four years.  Managed by urology for prostate cancer. Currently on testosterone  suppressive therapy.  Has bilateral knee pain managed at ortho which has limited his ability to exercise. Was previously exercising at cardiac rehab.  Current Medications:  Rosuvastatin  40mg  daily Fenofibrate  145mg  daily  Risk Factors:  CAD CHF HTN  LDL goal: <70  Labs: TC 128, Trigs 195, HDL 36, LDL 60 (09/21/23)  Past Medical History:  Diagnosis Date   Allergic rhinitis    Aortic atherosclerosis (HCC)    Arthritis    a little bit in some of my joints (06/20/2014)   Ascending aorta dilatation (HCC)    4.3cm by chest CTA 07/2023 and 44mm by echo 10/2023 and CT 12/2022   Chronic diastolic CHF (congestive heart failure) (HCC)    Coronary artery disease    a. 05/2014 Cath/PCI: LM nl, LAD 40p, 60-70d (FFR 0.80->3.0x24 Synergy DES), D2 40, LCX 90d (2.25x12 Synergy DES), RCA 20-59m, EF 60-65%. b. Cath 02/2017 without acute change, elev LVEDP suggestive of dCHF.   Depression    Dilated aortic root (HCC)    aortic root at 44mm by Chest CTA 11/2019   ED (erectile dysfunction)    Environmental allergies    dry cough   GERD (gastroesophageal reflux disease)    History of hiatal hernia    Hydronephrosis of right kidney    a. s/p ureteral stenting in the past.   Hyperlipidemia    under  control   Hypertension    Hypothyroidism    OCD (obsessive compulsive disorder)    Pacemaker    PAF (paroxysmal atrial fibrillation) (HCC)    Panic disorder    PAT (paroxysmal atrial tachycardia) (HCC)    PONV (postoperative nausea and vomiting)    after thyroid  surgery no problems in 10 years    Prostate cancer (HCC)    prostate cancer- robotic prostatectomy - followed by radiation because of positive lymph node--and pt on lupron  injections   PVC's (premature ventricular contractions)    Sinus bradycardia    a. chronic, asymptomatic.  24 hour Holter 10/2016 showed Sinus bradycardia, sinus arrhythmia and normal sinus rhythm with average heart rate 50bpm and heart rate ranged from 32 to 100bpm.   SUI (stress urinary incontinence), male    S/P ROBOTIC PROSTATECTOMY AND RADIATION TX    Current Outpatient Medications on File Prior to Visit  Medication Sig Dispense Refill   acetaminophen  (TYLENOL ) 500 MG tablet Take 1,000 mg by mouth every 6 (six) hours as needed (pain).     ALPRAZolam  (XANAX ) 0.25 MG tablet Take 1 tablet (0.25 mg total) by mouth 2 (two) times daily as needed for anxiety. 60 tablet 2   amLODipine  (NORVASC ) 5 MG tablet TAKE 1 TABLET (  5 MG TOTAL) BY MOUTH DAILY. 90 tablet 0   diazepam  (VALIUM ) 5 MG tablet Take 1 tablet (5 mg total) by mouth daily as needed for anxiety. 30 tablet 2   ELIQUIS  5 MG TABS tablet TAKE 1 TABLET BY MOUTH TWICE A DAY 180 tablet 3   fenofibrate  (TRICOR ) 145 MG tablet TAKE 1 TABLET BY MOUTH EVERY DAY 90 tablet 3   fluvoxaMINE  (LUVOX ) 100 MG tablet Take 1 tablet (100 mg total) by mouth 2 (two) times daily. 180 tablet 3   fluvoxaMINE  (LUVOX ) 100 MG tablet TAKE 3 TABLETS (300 MG TOTAL) DAILY. 270 tablet 1   furosemide  (LASIX ) 20 MG tablet TAKE 1 TABLET BY MOUTH DAILY AS NEEDED FOR FLUID OR EDEMA 90 tablet 3   latanoprost (XALATAN) 0.005 % ophthalmic solution Place 1 drop into both eyes at bedtime.      leuprolide  (LUPRON  DEPOT, 1-MONTH,) 30 MG injection  Inject 30 mg into the muscle every 6 (six) months.     levothyroxine  (SYNTHROID ) 137 MCG tablet Take 137 mcg by mouth every morning.     lisinopril  (ZESTRIL ) 40 MG tablet Take 1 tablet (40 mg total) by mouth daily. 90 tablet 3   metFORMIN (GLUCOPHAGE-XR) 500 MG 24 hr tablet Take 1 tablet by mouth at bedtime.     metoprolol  succinate (TOPROL -XL) 100 MG 24 hr tablet Take 1 tablet (100 mg total) by mouth daily. TAKE WITH OR IMMEDIATELY FOLLOWING A MEAL. 90 tablet 3   MYRBETRIQ 50 MG TB24 tablet Take 50 mg by mouth daily.     nitroGLYCERIN  (NITROSTAT ) 0.4 MG SL tablet Place 1 tablet (0.4 mg total) under the tongue every 5 (five) minutes as needed for chest pain. 25 tablet 4   oxyCODONE -acetaminophen  (PERCOCET/ROXICET) 5-325 MG tablet Take 1 tablet by mouth every 4 (four) hours as needed.     pantoprazole  (PROTONIX ) 40 MG tablet TAKE 2 TABLETS BY MOUTH EVERY DAY 180 tablet 2   rosuvastatin  (CRESTOR ) 40 MG tablet Take 1 tablet (40 mg total) by mouth daily. 90 tablet 3   sucralfate  (CARAFATE ) 1 g tablet Take 1 tablet (1 g total) by mouth 4 (four) times daily -  with meals and at bedtime. 5 min before meals for radiation induced esophagitis 120 tablet 2   sucralfate  (CARAFATE ) 1 g tablet Take 1 tablet (1 g total) by mouth 4 (four) times daily -  with meals and at bedtime. 5 min before meals for radiation induced esophagitis 120 tablet 2   traMADol (ULTRAM) 50 MG tablet Take 100 mg by mouth 3 (three) times daily as needed.     No current facility-administered medications on file prior to visit.    Allergies  Allergen Reactions   Cimetidine Other (See Comments)    Unknown - over 30 years ago (2021) - unsure what occurred it may have been hives or flushing Other reaction(s): flushed   Atorvastatin      Myalgias on 80mg  dose   Hydrocodone  Bit-Homatrop Mbr Other (See Comments)    Passed out   Niacin And Related Other (See Comments)    Flushing     Assessment/Plan:  1. Hyperlipidemia - Patient  trigyclerides currently at 195 which is above goal of <150. Currently on fenofibrate  145mg  with no reported adverse effects. Discussed next treatment option such as Vascepa. However he discusses his urologist has in the past recommended he avoid fish oils.  Low testosterone  can cause elevated triglycerides and patient is understandably not willing to interfere with his testosterone  suppression. No  medication changes at this time. Continue healthy eating and exercise as tolerated.  Continue rosuvastatin  40mg  daily Continue fenofibrate  145mg  daily  Blake Carter, PharmD, BCACP, CDCES, CPP [facility] [address], [place], [place] [address] Phone: [phone]; Fax: [phone] 11/18/2023 4:29 PM

## 2023-11-21 DIAGNOSIS — H26492 Other secondary cataract, left eye: Secondary | ICD-10-CM | POA: Diagnosis not present

## 2023-11-23 ENCOUNTER — Other Ambulatory Visit: Payer: Self-pay | Admitting: Internal Medicine

## 2023-11-23 DIAGNOSIS — R7309 Other abnormal glucose: Secondary | ICD-10-CM | POA: Diagnosis not present

## 2023-11-23 DIAGNOSIS — E7800 Pure hypercholesterolemia, unspecified: Secondary | ICD-10-CM | POA: Diagnosis not present

## 2023-11-23 DIAGNOSIS — I10 Essential (primary) hypertension: Secondary | ICD-10-CM | POA: Diagnosis not present

## 2023-11-23 DIAGNOSIS — M2011 Hallux valgus (acquired), right foot: Secondary | ICD-10-CM | POA: Diagnosis not present

## 2023-11-23 DIAGNOSIS — I2510 Atherosclerotic heart disease of native coronary artery without angina pectoris: Secondary | ICD-10-CM | POA: Diagnosis not present

## 2023-11-23 DIAGNOSIS — M2012 Hallux valgus (acquired), left foot: Secondary | ICD-10-CM | POA: Diagnosis not present

## 2023-11-28 ENCOUNTER — Other Ambulatory Visit: Payer: Self-pay | Admitting: Cardiology

## 2023-11-28 DIAGNOSIS — I2510 Atherosclerotic heart disease of native coronary artery without angina pectoris: Secondary | ICD-10-CM

## 2023-11-28 DIAGNOSIS — I480 Paroxysmal atrial fibrillation: Secondary | ICD-10-CM

## 2023-11-28 DIAGNOSIS — Z9842 Cataract extraction status, left eye: Secondary | ICD-10-CM | POA: Diagnosis not present

## 2023-11-28 DIAGNOSIS — Z961 Presence of intraocular lens: Secondary | ICD-10-CM | POA: Diagnosis not present

## 2023-11-28 DIAGNOSIS — H2512 Age-related nuclear cataract, left eye: Secondary | ICD-10-CM | POA: Diagnosis not present

## 2023-12-16 DIAGNOSIS — H53143 Visual discomfort, bilateral: Secondary | ICD-10-CM | POA: Diagnosis not present

## 2023-12-16 DIAGNOSIS — H04123 Dry eye syndrome of bilateral lacrimal glands: Secondary | ICD-10-CM | POA: Diagnosis not present

## 2023-12-16 DIAGNOSIS — Z9849 Cataract extraction status, unspecified eye: Secondary | ICD-10-CM | POA: Diagnosis not present

## 2023-12-16 DIAGNOSIS — Z961 Presence of intraocular lens: Secondary | ICD-10-CM | POA: Diagnosis not present

## 2023-12-19 ENCOUNTER — Encounter (HOSPITAL_COMMUNITY): Payer: Self-pay

## 2023-12-19 DIAGNOSIS — M48062 Spinal stenosis, lumbar region with neurogenic claudication: Secondary | ICD-10-CM | POA: Diagnosis not present

## 2023-12-21 ENCOUNTER — Encounter: Payer: Self-pay | Admitting: Cardiology

## 2023-12-21 ENCOUNTER — Ambulatory Visit (HOSPITAL_COMMUNITY)
Admission: RE | Admit: 2023-12-21 | Discharge: 2023-12-21 | Disposition: A | Discharge status: Home / Self Care | Source: Ambulatory Visit | Attending: Cardiology | Admitting: Cardiology

## 2023-12-21 ENCOUNTER — Telehealth: Payer: Self-pay | Admitting: Cardiology

## 2023-12-21 DIAGNOSIS — I480 Paroxysmal atrial fibrillation: Secondary | ICD-10-CM | POA: Insufficient documentation

## 2023-12-21 DIAGNOSIS — I10 Essential (primary) hypertension: Secondary | ICD-10-CM | POA: Diagnosis not present

## 2023-12-21 DIAGNOSIS — I77810 Thoracic aortic ectasia: Secondary | ICD-10-CM | POA: Insufficient documentation

## 2023-12-21 DIAGNOSIS — I2510 Atherosclerotic heart disease of native coronary artery without angina pectoris: Secondary | ICD-10-CM | POA: Insufficient documentation

## 2023-12-21 DIAGNOSIS — I5032 Chronic diastolic (congestive) heart failure: Secondary | ICD-10-CM | POA: Insufficient documentation

## 2023-12-21 DIAGNOSIS — E785 Hyperlipidemia, unspecified: Secondary | ICD-10-CM | POA: Diagnosis not present

## 2023-12-21 LAB — NM PET CT CARDIAC PERFUSION MULTI W/ABSOLUTE BLOODFLOW
LV dias vol: 146 mL (ref 62–150)
LV sys vol: 63 mL (ref 4.2–5.8)
MBFR: 2.36
Nuc Rest EF: 57 %
Nuc Stress EF: 63 %
Rest MBF: 0.96 ml/g/min
Rest Nuclear Isotope Dose: 30.3 mCi
ST Depression (mm): 0 mm
Stress MBF: 2.27 ml/g/min
Stress Nuclear Isotope Dose: 30.2 mCi

## 2023-12-21 MED ORDER — REGADENOSON 0.4 MG/5ML IV SOLN
INTRAVENOUS | Status: AC
  Filled 2023-12-21: qty 5

## 2023-12-21 MED ORDER — RUBIDIUM RB82 GENERATOR (RUBYFILL)
30.0000 | PACK | Freq: Once | INTRAVENOUS | Status: AC
  Administered 2023-12-21: 30.3 via INTRAVENOUS

## 2023-12-21 MED ORDER — REGADENOSON 0.4 MG/5ML IV SOLN
0.4000 mg | Freq: Once | INTRAVENOUS | Status: AC
  Administered 2023-12-21: 0.4 mg via INTRAVENOUS

## 2023-12-21 NOTE — Telephone Encounter (Signed)
 Caller Elnita Maxwell) is reporting out of range results.

## 2023-12-21 NOTE — Telephone Encounter (Signed)
 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 Report is available in epic. Will forward to dr turner.

## 2023-12-21 NOTE — Progress Notes (Signed)
 Pt. Tolerated lexi scan well.

## 2023-12-22 NOTE — Telephone Encounter (Signed)
 Call to patient to discuss stress test results. No answer, left message asking patient to call our office. Also sent MC.

## 2023-12-22 NOTE — Telephone Encounter (Signed)
-----   Message from Wilbert Bihari sent at 12/21/2023  7:47 PM EDT ----- Please let patient know that stress test was fine ----- Message ----- From: Interface, Rad Results In Sent: 12/21/2023   8:35 AM EDT To: Wilbert JONELLE Bihari, MD

## 2023-12-23 NOTE — Progress Notes (Signed)
 Remote pacemaker transmission.

## 2024-01-02 ENCOUNTER — Ambulatory Visit: Payer: Medicare PPO | Admitting: Adult Health

## 2024-01-02 ENCOUNTER — Encounter: Payer: Self-pay | Admitting: Adult Health

## 2024-01-02 DIAGNOSIS — Z6838 Body mass index (BMI) 38.0-38.9, adult: Secondary | ICD-10-CM | POA: Diagnosis not present

## 2024-01-02 DIAGNOSIS — F429 Obsessive-compulsive disorder, unspecified: Secondary | ICD-10-CM | POA: Diagnosis not present

## 2024-01-02 DIAGNOSIS — R059 Cough, unspecified: Secondary | ICD-10-CM | POA: Diagnosis not present

## 2024-01-02 DIAGNOSIS — F411 Generalized anxiety disorder: Secondary | ICD-10-CM | POA: Diagnosis not present

## 2024-01-02 DIAGNOSIS — J3489 Other specified disorders of nose and nasal sinuses: Secondary | ICD-10-CM | POA: Diagnosis not present

## 2024-01-02 DIAGNOSIS — F331 Major depressive disorder, recurrent, moderate: Secondary | ICD-10-CM

## 2024-01-02 MED ORDER — FLUVOXAMINE MALEATE 100 MG PO TABS: ORAL_TABLET | ORAL | 1 refills | Status: DC

## 2024-01-02 NOTE — Progress Notes (Signed)
 Blake Carter [id number] 1950-09-01 73 y.o.  Virtual Visit via Telephone Note  I connected with pt on 01/02/24 at 10:00 AM EDT by telephone and verified that I am speaking with the correct person using two identifiers.   I discussed the limitations, risks, security and privacy concerns of performing an evaluation and management service by telephone and the availability of in person appointments. I also discussed with the patient that there may be a patient responsible charge related to this service. The patient expressed understanding and agreed to proceed.   I discussed the assessment and treatment plan with the patient. The patient was provided an opportunity to ask questions and all were answered. The patient agreed with the plan and demonstrated an understanding of the instructions.   The patient was advised to call back or seek an in-person evaluation if the symptoms worsen or if the condition fails to improve as anticipated.  I provided 15 minutes of non-face-to-face time during this encounter.  The patient was located at home.  The provider was located at [facility].   Angeline LOISE Sayers, NP   Subjective:   Patient ID:  Blake Carter is a 73 y.o. (DOB 05-14-1951) male.  Chief Complaint: No chief complaint on file.   HPI Ulysse Siemen presents for follow-up of MDD, GAD and OCD.  Describes mood today as ok. Pleasant. Denies tearfulness. Mood symptoms - denies depression, anxiety and irritability. Reports stable interest and motivation. Denies panic attacks. Denies worry, over thinking and rumination. Reports mood is stable. Stating I think I'm where I need to be. Feels like the Luvox  continues to work well for him. Taking medications as prescribed. Energy levels stable. Active, has joined [employer] - water  walking - track. Enjoys some usual interests and activities. Married. Lives with wife. Has 2 grown children. Spending time with family. Appetite adequate.  Weight stable - 250 to 266 pounds - working with endocrinology. Sleeps well most nights. Averages 8 to 10 hours.  Focus and concentration good. Completing tasks. Managing aspects of household. Retired Pensions consultant and professor.  Denies SI or HI.  Denies AH or VH. Denies self harm. Denies substance use.   Review of Systems:  Review of Systems  Musculoskeletal:  Negative for gait problem.  Neurological:  Negative for tremors.  Psychiatric/Behavioral:         Please refer to HPI    Medications: I have reviewed the patient's current medications.  Current Outpatient Medications  Medication Sig Dispense Refill   acetaminophen  (TYLENOL ) 500 MG tablet Take 1,000 mg by mouth every 6 (six) hours as needed (pain).     ALPRAZolam  (XANAX ) 0.25 MG tablet Take 1 tablet (0.25 mg total) by mouth 2 (two) times daily as needed for anxiety. 60 tablet 2   amLODipine  (NORVASC ) 5 MG tablet TAKE 1 TABLET (5 MG TOTAL) BY MOUTH DAILY. 90 tablet 3   diazepam  (VALIUM ) 5 MG tablet Take 1 tablet (5 mg total) by mouth daily as needed for anxiety. 30 tablet 2   ELIQUIS  5 MG TABS tablet TAKE 1 TABLET BY MOUTH TWICE A DAY 180 tablet 3   fenofibrate  (TRICOR ) 145 MG tablet TAKE 1 TABLET BY MOUTH EVERY DAY 90 tablet 3   fluvoxaMINE  (LUVOX ) 100 MG tablet TAKE 3 TABLETS (300 MG TOTAL) DAILY. 270 tablet 1   furosemide  (LASIX ) 20 MG tablet TAKE 1 TABLET BY MOUTH DAILY AS NEEDED FOR FLUID OR EDEMA 90 tablet 3   latanoprost (XALATAN) 0.005 % ophthalmic solution Place 1 drop into both  eyes at bedtime.      leuprolide  (LUPRON  DEPOT, 37-MONTH,) 30 MG injection Inject 30 mg into the muscle every 6 (six) months.     levothyroxine  (SYNTHROID ) 137 MCG tablet Take 137 mcg by mouth every morning.     lisinopril  (ZESTRIL ) 40 MG tablet TAKE 1 TABLET BY MOUTH EVERY DAY 90 tablet 3   metFORMIN (GLUCOPHAGE-XR) 500 MG 24 hr tablet Take 1 tablet by mouth 2 (two) times daily with a meal.     metoprolol  succinate (TOPROL -XL) 100 MG 24 hr tablet  Take 1 tablet (100 mg total) by mouth daily. TAKE WITH OR IMMEDIATELY FOLLOWING A MEAL. 90 tablet 3   nitroGLYCERIN  (NITROSTAT ) 0.4 MG SL tablet Place 1 tablet (0.4 mg total) under the tongue every 5 (five) minutes as needed for chest pain. 25 tablet 4   pantoprazole  (PROTONIX ) 40 MG tablet TAKE 2 TABLETS BY MOUTH EVERY DAY 180 tablet 2   pregabalin (LYRICA) 75 MG capsule Take 75 mg by mouth 2 (two) times daily.     rosuvastatin  (CRESTOR ) 40 MG tablet TAKE 1 TABLET BY MOUTH EVERY DAY 90 tablet 3   No current facility-administered medications for this visit.    Medication Side Effects: None  Allergies:  Allergies  Allergen Reactions   Cimetidine Other (See Comments)    Unknown - over 30 years ago (2021) - unsure what occurred it may have been hives or flushing Other reaction(s): flushed   Atorvastatin      Myalgias on 80mg  dose   Hydrocodone  Bit-Homatrop Mbr Other (See Comments)    Passed out   Niacin And Related Other (See Comments)    Flushing     Past Medical History:  Diagnosis Date   Allergic rhinitis    Aortic atherosclerosis (HCC)    Arthritis    a little bit in some of my joints (06/20/2014)   Ascending aorta dilatation (HCC)    4.1cm ascending aorta by CT 11/2023   Chronic diastolic CHF (congestive heart failure) (HCC)    Coronary artery disease    a. 05/2014 Cath/PCI: LM nl, LAD 40p, 60-70d (FFR 0.80->3.0x24 Synergy DES), D2 40, LCX 90d (2.25x12 Synergy DES), RCA 20-77m, EF 60-65%. b. Cath 02/2017 without acute change, elev LVEDP suggestive of dCHF.   Depression    Dilated aortic root (HCC)    aortic root at 44mm by Chest CTA 11/2019   ED (erectile dysfunction)    Environmental allergies    dry cough   GERD (gastroesophageal reflux disease)    History of hiatal hernia    Hydronephrosis of right kidney    a. s/p ureteral stenting in the past.   Hyperlipidemia    under control   Hypertension    Hypothyroidism    OCD (obsessive compulsive disorder)    Pacemaker     PAF (paroxysmal atrial fibrillation) (HCC)    Panic disorder    PAT (paroxysmal atrial tachycardia) (HCC)    PONV (postoperative nausea and vomiting)    after thyroid  surgery no problems in 10 years    Prostate cancer (HCC)    prostate cancer- robotic prostatectomy - followed by radiation because of positive lymph node--and pt on lupron  injections   PVC's (premature ventricular contractions)    Sinus bradycardia    a. chronic, asymptomatic.  24 hour Holter 10/2016 showed Sinus bradycardia, sinus arrhythmia and normal sinus rhythm with average heart rate 50bpm and heart rate ranged from 32 to 100bpm.   SUI (stress urinary incontinence), male    S/P  ROBOTIC PROSTATECTOMY AND RADIATION TX    Family History  Problem Relation Age of Onset   Cancer Father    Hyperlipidemia Father    Heart disease Father        Father had CABG/aorta repair by the time he was in his 48s   CAD Brother        Brother who is 10 years younger has had some sort of stenting    Social History   Socioeconomic History   Marital status: Married    Spouse name: Not on file   Number of children: Not on file   Years of education: Not on file   Highest education level: Not on file  Occupational History   Not on file  Tobacco Use   Smoking status: Never   Smokeless tobacco: Never  Vaping Use   Vaping status: Never Used  Substance and Sexual Activity   Alcohol use: Yes    Alcohol/week: 0.0 standard drinks of alcohol    Comment: 06/20/2014 1-2 drinks a couple times/month   Drug use: No   Sexual activity: Yes  Other Topics Concern   Not on file  Social History Narrative   Not on file   Social Drivers of Health   Financial Resource Strain: Not on file  Food Insecurity: No Food Insecurity (11/29/2022)   Hunger Vital Sign    Worried About Running Out of Food in the Last Year: Never true    Ran Out of Food in the Last Year: Never true  Transportation Needs: No Transportation Needs (11/29/2022)   PRAPARE -  Administrator, Civil Service (Medical): No    Lack of Transportation (Non-Medical): No  Physical Activity: Not on file  Stress: Not on file  Social Connections: Unknown (10/13/2021)   [facility]   Social Network    Social Network: Not on file  Intimate Partner Violence: Not At Risk (11/29/2022)   Humiliation, Afraid, Rape, and Kick questionnaire    Fear of Current or Ex-Partner: No    Emotionally Abused: No    Physically Abused: No    Sexually Abused: No    Past Medical History, Surgical history, Social history, and Family history were reviewed and updated as appropriate.   Please see review of systems for further details on the patient's review from today.   Objective:   Physical Exam:  There were no vitals taken for this visit.  Physical Exam Constitutional:      General: He is not in acute distress. Musculoskeletal:        General: No deformity.  Neurological:     Mental Status: He is alert and oriented to person, place, and time.     Coordination: Coordination normal.  Psychiatric:        Attention and Perception: Attention and perception normal. He does not perceive auditory or visual hallucinations.        Mood and Affect: Mood normal. Mood is not anxious or depressed. Affect is not labile, blunt, angry or inappropriate.        Speech: Speech normal.        Behavior: Behavior normal.        Thought Content: Thought content normal. Thought content is not paranoid or delusional. Thought content does not include homicidal or suicidal ideation. Thought content does not include homicidal or suicidal plan.        Cognition and Memory: Cognition and memory normal.        Judgment: Judgment normal.  Comments: Insight intact     Lab Review:     Component Value Date/Time   NA 140 03/18/2023 1151   K 4.1 03/18/2023 1151   CL 105 03/18/2023 1151   CO2 21 03/18/2023 1151   GLUCOSE 116 (H) 03/18/2023 1151   GLUCOSE 130 (H) 01/09/2023 0746    BUN 31 (H) 03/18/2023 1151   CREATININE 1.02 03/18/2023 1151   CREATININE 0.92 10/31/2018 1247   CREATININE 1.07 10/14/2015 0850   CALCIUM  9.5 03/18/2023 1151   PROT 7.7 01/09/2023 0746   PROT 7.0 01/07/2023 0841   ALBUMIN 4.1 01/09/2023 0746   ALBUMIN 4.5 01/07/2023 0841   AST 19 01/09/2023 0746   AST 19 10/31/2018 1247   ALT 32 09/21/2023 1057   ALT 25 10/31/2018 1247   ALKPHOS 42 01/09/2023 0746   BILITOT 0.5 01/09/2023 0746   BILITOT 0.3 01/07/2023 0841   BILITOT 0.3 10/31/2018 1247   GFRNONAA >60 01/09/2023 0746   GFRNONAA >60 10/31/2018 1247   GFRAA 69 07/03/2020 1146   GFRAA >60 10/31/2018 1247       Component Value Date/Time   WBC 4.7 01/09/2023 0746   RBC 4.10 (L) 01/09/2023 0746   HGB 11.7 (L) 01/09/2023 0746   HGB 11.7 (L) 01/07/2023 0841   HCT 36.6 (L) 01/09/2023 0746   HCT 36.2 (L) 01/07/2023 0841   PLT 198 01/09/2023 0746   PLT 205 01/07/2023 0841   MCV 89.3 01/09/2023 0746   MCV 88 01/07/2023 0841   MCH 28.5 01/09/2023 0746   MCHC 32.0 01/09/2023 0746   RDW 14.8 01/09/2023 0746   RDW 13.8 01/07/2023 0841   LYMPHSABS 0.5 (L) 01/07/2023 0841   MONOABS 0.6 12/26/2019 0931   EOSABS 0.1 01/07/2023 0841   BASOSABS 0.0 01/07/2023 0841    No results found for: POCLITH, LITHIUM   No results found for: PHENYTOIN, PHENOBARB, VALPROATE, CBMZ   .res Assessment: Plan:    Plan:  PDMP reviewed  Luvox  300mg  daily Valium  5mg  daily - none needed today  RTC 6 months  15 minutes spent dedicated to the care of this patient on the date of this encounter to include pre-visit review of records, ordering of medication, post visit documentation, and face-to-face time with the patient discussing depression, anxiety and OCD. Discussed continuing current medication regimen.  Patient advised to contact office with any questions, adverse effects, or acute worsening in signs and symptoms.  Discussed potential benefits, risk, and side effects of  benzodiazepines to include potential risk of tolerance and dependence, as well as possible drowsiness. Advised patient not to drive if experiencing drowsiness and to take lowest possible effective dose to minimize risk of dependence and tolerance.  There are no diagnoses linked to this encounter.  Please see After Visit Summary for patient specific instructions.  Future Appointments  Date Time Provider Department Center  01/02/2024 10:00 AM Helyne Genther, Angeline Mattocks, NP [facility] None  02/13/2024  7:15 AM [facility] DEVICE REMOTES [facility]  05/14/2024  7:05 AM [facility] DEVICE REMOTES [facility]  08/13/2024  7:05 AM [facility] DEVICE REMOTES [facility]  11/12/2024  7:05 AM [facility] DEVICE REMOTES [facility]  02/11/2025  7:05 AM [facility] DEVICE REMOTES [facility]  05/13/2025  7:05 AM [facility] DEVICE REMOTES [facility]    No orders of the defined types were placed in this encounter.     -------------------------------

## 2024-01-03 DIAGNOSIS — M1712 Unilateral primary osteoarthritis, left knee: Secondary | ICD-10-CM | POA: Diagnosis not present

## 2024-01-04 ENCOUNTER — Other Ambulatory Visit: Payer: Self-pay

## 2024-01-04 DIAGNOSIS — K760 Fatty (change of) liver, not elsewhere classified: Secondary | ICD-10-CM

## 2024-01-11 DIAGNOSIS — E890 Postprocedural hypothyroidism: Secondary | ICD-10-CM | POA: Diagnosis not present

## 2024-01-11 DIAGNOSIS — R7309 Other abnormal glucose: Secondary | ICD-10-CM | POA: Diagnosis not present

## 2024-01-18 DIAGNOSIS — R7309 Other abnormal glucose: Secondary | ICD-10-CM | POA: Diagnosis not present

## 2024-01-18 DIAGNOSIS — E7800 Pure hypercholesterolemia, unspecified: Secondary | ICD-10-CM | POA: Diagnosis not present

## 2024-01-18 DIAGNOSIS — I2510 Atherosclerotic heart disease of native coronary artery without angina pectoris: Secondary | ICD-10-CM | POA: Diagnosis not present

## 2024-01-18 DIAGNOSIS — E890 Postprocedural hypothyroidism: Secondary | ICD-10-CM | POA: Diagnosis not present

## 2024-01-18 DIAGNOSIS — E669 Obesity, unspecified: Secondary | ICD-10-CM | POA: Diagnosis not present

## 2024-01-18 DIAGNOSIS — I10 Essential (primary) hypertension: Secondary | ICD-10-CM | POA: Diagnosis not present

## 2024-01-19 DIAGNOSIS — C61 Malignant neoplasm of prostate: Secondary | ICD-10-CM | POA: Diagnosis not present

## 2024-01-26 ENCOUNTER — Telehealth: Payer: Self-pay

## 2024-01-26 DIAGNOSIS — R1011 Right upper quadrant pain: Secondary | ICD-10-CM | POA: Diagnosis not present

## 2024-01-26 DIAGNOSIS — Z8679 Personal history of other diseases of the circulatory system: Secondary | ICD-10-CM | POA: Diagnosis not present

## 2024-01-26 DIAGNOSIS — Z87898 Personal history of other specified conditions: Secondary | ICD-10-CM | POA: Diagnosis not present

## 2024-01-26 DIAGNOSIS — K219 Gastro-esophageal reflux disease without esophagitis: Secondary | ICD-10-CM | POA: Diagnosis not present

## 2024-01-26 DIAGNOSIS — D649 Anemia, unspecified: Secondary | ICD-10-CM | POA: Diagnosis not present

## 2024-01-26 DIAGNOSIS — K209 Esophagitis, unspecified without bleeding: Secondary | ICD-10-CM | POA: Diagnosis not present

## 2024-01-26 DIAGNOSIS — K760 Fatty (change of) liver, not elsewhere classified: Secondary | ICD-10-CM | POA: Diagnosis not present

## 2024-01-26 NOTE — Telephone Encounter (Signed)
   Pre-operative Risk Assessment    Patient Name: Blake Carter  DOB: [birth year] MRN: [id number]   Date of last office visit: 09/21/23 WILBERT BIHARI, MD Date of next office visit: NONE   Request for Surgical Clearance    Procedure:  ENDOSCOPY  Date of Surgery:  Clearance 02/23/24                                Surgeon:  DR ELICIA Surgeon's Group or Practice Name:  [facility] Phone number:  [phone] Fax number:  [phone]   Type of Clearance Requested:   - Medical  - Pharmacy:  Hold Apixaban  (Eliquis )     Type of Anesthesia:  PROPOFOL    Additional requests/questions:    SignedLucie DELENA Ku   01/26/2024, 4:20 PM

## 2024-01-26 NOTE — Telephone Encounter (Signed)
 Pharmacy please advise on holding Eliquis  prior to endoscopy scheduled for 02/23/2024. Thank you.

## 2024-01-31 ENCOUNTER — Telehealth: Payer: Self-pay

## 2024-01-31 NOTE — Telephone Encounter (Signed)
Patient has been scheduled for televisit.

## 2024-01-31 NOTE — Telephone Encounter (Signed)
   Name: Draylon Mercadel  DOB: 25-Apr-1951  MRN: [id number]  Primary Cardiologist: Wilbert Bihari, MD   Preoperative team, please contact this patient and set up a phone call appointment for further preoperative risk assessment. Please obtain consent and complete medication review. Thank you for your help.  I confirm that guidance regarding antiplatelet and oral anticoagulation therapy has been completed and, if necessary, noted below.  Patient has not had an Afib/aflutter ablation within the last 3 months or DCCV within the last 30 days   Per office protocol, patient can hold Eliquis  for 2 days prior to procedure.   Patient will not need bridging with Lovenox (enoxaparin) around procedure.  I also confirmed the patient resides in the state of [place] . As per [facility] Medical Board telemedicine laws, the patient must reside in the state in which the provider is licensed.   Josefa CHRISTELLA Beauvais, NP 01/31/2024, 11:12 AM [facility]

## 2024-01-31 NOTE — Telephone Encounter (Signed)
 Patient with diagnosis of atrial fibrillation on Eliquis  for anticoagulation.    Procedure:  ENDOSCOPY   Date of Surgery:  Clearance 02/23/24                               CHA2DS2-VASc Score = 4   This indicates a 4.8% annual risk of stroke. The patient's score is based upon: CHF History: 1 HTN History: 1 Diabetes History: 0 Stroke History: 0 Vascular Disease History: 1 Age Score: 1 Gender Score: 0   CrCl 110 Platelet count 198  Patient has not had an Afib/aflutter ablation within the last 3 months or DCCV within the last 30 days  Per office protocol, patient can hold Eliquis  for 2 days prior to procedure.   Patient will not need bridging with Lovenox (enoxaparin) around procedure.  **This guidance is not considered finalized until pre-operative APP has relayed final recommendations.**

## 2024-01-31 NOTE — Telephone Encounter (Signed)
 Patient has been scheduled for televisit med rec and consent done     Patient Consent for Virtual Visit         Blake Carter has provided verbal consent on 01/31/2024 for a virtual visit (video or telephone).   CONSENT FOR VIRTUAL VISIT FOR:  Blake Carter  By participating in this virtual visit I agree to the following:  I hereby voluntarily request, consent and authorize [facility] and its employed or contracted physicians, physician assistants, nurse practitioners or other licensed health care professionals (the Practitioner), to provide me with telemedicine health care services (the "Services) as deemed necessary by the treating Practitioner. I acknowledge and consent to receive the Services by the Practitioner via telemedicine. I understand that the telemedicine visit will involve communicating with the Practitioner through live audiovisual communication technology and the disclosure of certain medical information by electronic transmission. I acknowledge that I have been given the opportunity to request an in-person assessment or other available alternative prior to the telemedicine visit and am voluntarily participating in the telemedicine visit.  I understand that I have the right to withhold or withdraw my consent to the use of telemedicine in the course of my care at any time, without affecting my right to future care or treatment, and that the Practitioner or I may terminate the telemedicine visit at any time. I understand that I have the right to inspect all information obtained and/or recorded in the course of the telemedicine visit and may receive copies of available information for a reasonable fee.  I understand that some of the potential risks of receiving the Services via telemedicine include:  Delay or interruption in medical evaluation due to technological equipment failure or disruption; Information transmitted may not be sufficient (e.g. poor resolution of  images) to allow for appropriate medical decision making by the Practitioner; and/or  In rare instances, security protocols could fail, causing a breach of personal health information.  Furthermore, I acknowledge that it is my responsibility to provide information about my medical history, conditions and care that is complete and accurate to the best of my ability. I acknowledge that Practitioner's advice, recommendations, and/or decision may be based on factors not within their control, such as incomplete or inaccurate data provided by me or distortions of diagnostic images or specimens that may result from electronic transmissions. I understand that the practice of medicine is not an exact science and that Practitioner makes no warranties or guarantees regarding treatment outcomes. I acknowledge that a copy of this consent can be made available to me via my patient portal [facility] MyChart), or I can request a printed copy by calling the office of [facility].    I understand that my insurance will be billed for this visit.   I have read or had this consent read to me. I understand the contents of this consent, which adequately explains the benefits and risks of the Services being provided via telemedicine.  I have been provided ample opportunity to ask questions regarding this consent and the Services and have had my questions answered to my satisfaction. I give my informed consent for the services to be provided through the use of telemedicine in my medical care

## 2024-02-03 ENCOUNTER — Other Ambulatory Visit: Payer: Self-pay | Admitting: Adult Health

## 2024-02-03 ENCOUNTER — Other Ambulatory Visit: Payer: Self-pay | Admitting: Cardiology

## 2024-02-03 DIAGNOSIS — I5032 Chronic diastolic (congestive) heart failure: Secondary | ICD-10-CM

## 2024-02-03 DIAGNOSIS — I10 Essential (primary) hypertension: Secondary | ICD-10-CM

## 2024-02-03 DIAGNOSIS — F411 Generalized anxiety disorder: Secondary | ICD-10-CM

## 2024-02-03 DIAGNOSIS — E7800 Pure hypercholesterolemia, unspecified: Secondary | ICD-10-CM

## 2024-02-03 DIAGNOSIS — I2510 Atherosclerotic heart disease of native coronary artery without angina pectoris: Secondary | ICD-10-CM

## 2024-02-03 DIAGNOSIS — I480 Paroxysmal atrial fibrillation: Secondary | ICD-10-CM

## 2024-02-03 DIAGNOSIS — I77810 Thoracic aortic ectasia: Secondary | ICD-10-CM

## 2024-02-03 DIAGNOSIS — I495 Sick sinus syndrome: Secondary | ICD-10-CM

## 2024-02-05 ENCOUNTER — Other Ambulatory Visit: Payer: Self-pay | Admitting: Cardiology

## 2024-02-07 ENCOUNTER — Ambulatory Visit: Payer: Self-pay | Admitting: General Surgery

## 2024-02-07 ENCOUNTER — Telehealth: Payer: Self-pay

## 2024-02-07 DIAGNOSIS — K8050 Calculus of bile duct without cholangitis or cholecystitis without obstruction: Secondary | ICD-10-CM | POA: Diagnosis not present

## 2024-02-07 NOTE — Telephone Encounter (Signed)
   Pre-operative Risk Assessment    Patient Name: Deleon Passe  DOB: 08-Dec-1950 MRN: [id number]   Date of last office visit: 09/21/23 Date of next office visit: N/A   Request for Surgical Clearance    Procedure:  Laparoscopic Cholecystectomy   Date of Surgery:  Clearance TBD                                 Surgeon:  Dr. Richerd Silversmith  Surgeon's Group or Practice Name:  [facility]  Phone number:  [phone] Fax number:  [phone]   Type of Clearance Requested:   - Medical  - Pharmacy:  Hold Apixaban  (Eliquis ) Not indicated    Type of Anesthesia:  General    Additional requests/questions:    Bonney Rebeca Blight   02/07/2024, 10:03 AM

## 2024-02-08 ENCOUNTER — Ambulatory Visit: Attending: Cardiovascular Disease

## 2024-02-08 DIAGNOSIS — Z01810 Encounter for preprocedural cardiovascular examination: Secondary | ICD-10-CM | POA: Diagnosis not present

## 2024-02-08 NOTE — Progress Notes (Signed)
 Virtual Visit via Telephone Note   Because of Blake Carter co-morbid illnesses, he is at least at moderate risk for complications without adequate follow up.  This format is felt to be most appropriate for this patient at this time.  Due to technical limitations with video connection (technology), today's appointment will be conducted as an audio only telehealth visit, and Blake Carter verbally agreed to proceed in this manner.   All issues noted in this document were discussed and addressed.  No physical exam could be performed with this format.  Evaluation Performed:  Preoperative cardiovascular risk assessment _____________   Date:  02/08/2024   Patient ID:  Blake Carter, DOB 26-Aug-1950, MRN [id number] Patient Location:  Home Provider location:   Office  Primary Care Provider:  Okey Carlin Redbird, Carter Primary Cardiologist:  Blake Bihari, Carter  Chief Complaint / Patient Profile   73 y.o. 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  History of Present Illness    Blake Carter is a 73 y.o. male who presents via audio/video conferencing for a telehealth visit today.  Pt was last seen in cardiology clinic on 09/21/23 by Dr. Bihari.  At that time Blake Carter was doing well other than some shortness of breath.  A stress PET/CT was ordered.  The patient is now pending procedure as outlined above. Since his last visit, he states there are no heart issues. He wants to loose about 50 lbs. Waiting on insurance for Zepbound. He has started water  walking, knees are both messed up so this is the best way for him to get his cardio. No chest pain, no SOB.  Twinges in his chest but nothing new. No nitroglycerin  needed.   Per office protocol, patient can hold Eliquis  for 2 days prior to procedure. He can resume when medically safe to do so.  Patient will not need bridging with Lovenox (enoxaparin) around procedure.   Past Medical History    Past Medical History:  Diagnosis Date   Allergic rhinitis    Aortic atherosclerosis (HCC)    Arthritis    a little bit in some of my joints (06/20/2014)   Ascending aorta dilatation (HCC)    4.1cm ascending aorta by CT 11/2023   Chronic diastolic CHF (congestive heart failure) (HCC)    Coronary artery disease    a. 05/2014 Cath/PCI: LM nl, LAD 40p, 60-70d (FFR 0.80->3.0x24 Synergy DES), D2 40, LCX 90d (2.25x12 Synergy DES), RCA 20-57m, EF 60-65%. b. Cath 02/2017 without acute change, elev LVEDP suggestive of dCHF.   Depression    Dilated aortic root (HCC)    aortic root at 44mm by Chest CTA 11/2019   ED (erectile dysfunction)    Environmental allergies    dry cough   GERD (gastroesophageal reflux disease)    History of hiatal hernia    Hydronephrosis of right kidney    a. s/p ureteral stenting in the past.   Hyperlipidemia    under control   Hypertension    Hypothyroidism    OCD (obsessive compulsive disorder)    Pacemaker    PAF (paroxysmal atrial fibrillation) (HCC)    Panic disorder    PAT (paroxysmal atrial tachycardia) (HCC)    PONV (postoperative nausea and vomiting)  after thyroid  surgery no problems in 10 years    Prostate cancer [facility])    prostate cancer- robotic prostatectomy - followed by radiation because of positive lymph node--and pt on lupron  injections   PVC's (premature ventricular contractions)    Sinus bradycardia    a. chronic, asymptomatic.  24 hour Holter 10/2016 showed Sinus bradycardia, sinus arrhythmia and normal sinus rhythm with average heart rate 50bpm and heart rate ranged from 32 to 100bpm.   SUI (stress urinary incontinence), male    S/P ROBOTIC PROSTATECTOMY  AND RADIATION TX   Past Surgical History:  Procedure Laterality Date   COLONOSCOPY  07/2013   Dr. Celestia   CORONARY ANGIOPLASTY WITH STENT PLACEMENT  06/20/2014   2   CORONARY PRESSURE/FFR STUDY N/A 07/08/2020   Procedure: INTRAVASCULAR PRESSURE WIRE/FFR STUDY;  Surgeon: Blake Victory ORN, Carter;  Location: [facility] INVASIVE CV LAB;  Service: Cardiovascular;  Laterality: N/A;   CYSTOSCOPY  11/30/2011   Procedure: CYSTOSCOPY;  Surgeon: Blake DELENA Elizabeth, Carter;  Location: [facility] ORS;  Service: Urology;  Laterality: N/A;  Inplantation of Artificial Sphincter and Cystoscopy   CYSTOSCOPY WITH RETROGRADE PYELOGRAM, URETEROSCOPY AND STENT PLACEMENT Right 02/11/2014   Procedure: CYSTOSCOPY WITH RETROGRADE PYELOGRAM, URETEROSCOPY ,URETERAL BIOPSY AND STENT PLACEMENT;  Surgeon: Blake Ferrara, Carter;  Location: [facility];  Service: Urology;  Laterality: Right;   CYSTOSCOPY WITH RETROGRADE PYELOGRAM, URETEROSCOPY AND STENT PLACEMENT Right 04/15/2014   Procedure: CYSTOSCOPY WITH RETROGRADE PYELOGRAM, URETEROSCOPY, BIOPSY AND STENT PLACEMENT;  Surgeon: Blake Ferrara, Carter;  Location: [facility];  Service: Urology;  Laterality: Right;   LEFT HEART CATH AND CORONARY ANGIOGRAPHY N/A 03/29/2017   Procedure: LEFT HEART CATH AND CORONARY ANGIOGRAPHY;  Surgeon: Blake Carter;  Location: [facility] INVASIVE CV LAB;  Service: Cardiovascular;  Laterality: N/A;   LEFT HEART CATHETERIZATION WITH CORONARY ANGIOGRAM N/A 06/20/2014   Procedure: LEFT HEART CATHETERIZATION WITH CORONARY ANGIOGRAM;  Surgeon: Blake Carter;  Location: [facility] CATH LAB;  Service: Cardiovascular;  Laterality: N/A;   PACEMAKER IMPLANT N/A 05/21/2019   Procedure: PACEMAKER IMPLANT;  Surgeon: Blake Carter;  Location: [facility] INVASIVE CV LAB;  Service: Cardiovascular;  Laterality: N/A;   PERCUTANEOUS CORONARY STENT INTERVENTION (PCI-S)  06/20/2014   Procedure: PERCUTANEOUS CORONARY STENT INTERVENTION (PCI-S);  Surgeon: Blake Carter;  Location: [facility] CATH LAB;  Service:  Cardiovascular;;  LAD and Circumflex   REFRACTIVE SURGERY Bilateral 2004   RIGHT/LEFT HEART CATH AND CORONARY ANGIOGRAPHY N/A 07/08/2020   Procedure: RIGHT/LEFT HEART CATH AND CORONARY ANGIOGRAPHY;  Surgeon: Blake Victory ORN, Carter;  Location: [facility] INVASIVE CV LAB;  Service: Cardiovascular;  Laterality: N/A;   ROBOT ASSISTED LAPAROSCOPIC RADICAL PROSTATECTOMY  05/2010   THYROIDECTOMY, PARTIAL  10/1974   TONSILLECTOMY  1956    TOTAL THYROIDECTOMY  10/2012   nuked it   UPPER GI ENDOSCOPY     food impaction 2009 done by Dr Blake Carter   URINARY SPHINCTER IMPLANT  11/30/2011   Procedure: ARTIFICIAL URINARY SPHINCTER;  Surgeon: Blake DELENA Elizabeth, Carter;  Location: [facility];  Service: Urology;  Laterality: N/A;    Allergies  Allergies  Allergen Reactions   Cimetidine Other (See Comments)    Unknown - over 30 years ago (2021) - unsure what occurred it may have been hives or flushing Other reaction(s): flushed   Atorvastatin      Myalgias on 80mg  dose   Hydrocodone  Bit-Homatrop Mbr Other (See Comments)    Passed out   Niacin And Related Other (See Comments)    Flushing  Home Medications    Prior to Admission medications   Medication Sig Start Date End Date Taking? Authorizing Provider  acetaminophen  (TYLENOL ) 500 MG tablet Take 1,000 mg by mouth every 6 (six) hours as needed (pain).    Provider, Historical, Carter  ALPRAZolam  (XANAX ) 0.25 MG tablet Take 1 tablet (0.25 mg total) by mouth 2 (two) times daily as needed for anxiety. 07/17/20   Mozingo, Regina Nattalie, NP  amLODipine  (NORVASC ) 5 MG tablet TAKE 1 TABLET (5 MG TOTAL) BY MOUTH DAILY. 11/23/23   Blake Carter  diazepam  (VALIUM ) 5 MG tablet TAKE 1 TABLET BY MOUTH DAILY AS NEEDED FOR ANXIETY. 02/06/24   Mozingo, Regina Nattalie, NP  ELIQUIS  5 MG TABS tablet TAKE 1 TABLET BY MOUTH TWICE A DAY 01/17/23   Swinyer, Rosaline HERO, NP  fenofibrate  (TRICOR ) 145 MG tablet TAKE 1 TABLET BY MOUTH EVERY DAY 02/01/23   Shlomo Blake SAUNDERS, Carter  fluvoxaMINE  (LUVOX ) 100  MG tablet TAKE 3 TABLETS (300 MG TOTAL) DAILY. 01/02/24   Mozingo, Regina Nattalie, NP  furosemide  (LASIX ) 20 MG tablet TAKE 1 TABLET BY MOUTH DAILY AS NEEDED FOR FLUID OR EDEMA 04/07/23   Swinyer, Rosaline HERO, NP  latanoprost (XALATAN) 0.005 % ophthalmic solution Place 1 drop into both eyes at bedtime.  03/18/15   Provider, Historical, Carter  leuprolide  (LUPRON  DEPOT, 41-MONTH,) 30 MG injection Inject 30 mg into the muscle every 6 (six) months. 01/04/18   Provider, Historical, Carter  levothyroxine  (SYNTHROID ) 150 MCG tablet Take 150 mcg by mouth daily before breakfast.    Provider, Historical, Carter  lisinopril  (ZESTRIL ) 40 MG tablet TAKE 1 TABLET BY MOUTH EVERY DAY 11/29/23   Shlomo Blake SAUNDERS, Carter  metFORMIN (GLUCOPHAGE-XR) 500 MG 24 hr tablet Take 1 tablet by mouth 2 (two) times daily with a meal. 07/17/21   Provider, Historical, Carter  metoprolol  succinate (TOPROL -XL) 100 MG 24 hr tablet Take 1 tablet (100 mg total) by mouth daily. TAKE WITH OR IMMEDIATELY FOLLOWING A MEAL. 02/02/23 02/03/24  Shlomo Blake SAUNDERS, Carter  nitroGLYCERIN  (NITROSTAT ) 0.4 MG SL tablet PLACE 1 TABLET UNDER THE TONGUE EVERY 5 MINUTES AS NEEDED FOR CHEST PAIN. 02/03/24   Shlomo Blake SAUNDERS, Carter  pantoprazole  (PROTONIX ) 40 MG tablet TAKE 2 TABLETS BY MOUTH EVERY DAY 02/07/24   Shlomo Blake SAUNDERS, Carter  pregabalin (LYRICA) 100 MG capsule Take 100 mg by mouth 2 (two) times daily.    Provider, Historical, Carter  rosuvastatin  (CRESTOR ) 40 MG tablet TAKE 1 TABLET BY MOUTH EVERY DAY 11/29/23   Shlomo Blake SAUNDERS, Carter    Physical Exam    Vital Signs:  Blake Carter does not have vital signs available for review today.  Given telephonic nature of communication, physical exam is limited. AAOx3. NAD. Normal affect.  Speech and respirations are unlabored.  Accessory Clinical Findings    None  Assessment & Plan    1.  Preoperative Cardiovascular Risk Assessment:  Blake Carter perioperative risk of a major cardiac event is 6.6% according to the Revised Cardiac Risk Index  (RCRI).  Therefore, he is at high risk for perioperative complications.   His functional capacity is good at 6.55 METs according to the Duke Activity Status Index (DASI). Recommendations: According to ACC/AHA guidelines, no further cardiovascular testing needed.  The patient may proceed to surgery at acceptable risk.   Antiplatelet and/or Anticoagulation Recommendations:  Eliquis  (Apixaban ) can be held for 2 days prior to surgery.  Please resume post op when felt to be safe.  The patient was advised that if he develops new symptoms prior to surgery to contact our office to arrange for a follow-up visit, and he verbalized understanding.   A copy of this note will be routed to requesting surgeon.  Time:   Today, I have spent 12 minutes with the patient with telehealth technology discussing medical history, symptoms, and management plan.     Blake LOISE Fabry, PA-C  02/08/2024, 8:20 AM

## 2024-02-08 NOTE — Addendum Note (Signed)
 Addended by: LUCIEN ORREN SAILOR on: 02/08/2024 11:19 AM   Modules accepted: Level of Service

## 2024-02-09 DIAGNOSIS — Z23 Encounter for immunization: Secondary | ICD-10-CM | POA: Diagnosis not present

## 2024-02-09 NOTE — Telephone Encounter (Signed)
 Patient with diagnosis of afib on Eliquis  for anticoagulation.    Procedure:  Laparoscopic Cholecystectomy  Date of procedure: TBD   CHA2DS2-VASc Score = 4   This indicates a 4.8% annual risk of stroke. The patient's score is based upon: CHF History: 1 HTN History: 1 Diabetes History: 0 Stroke History: 0 Vascular Disease History: 1 Age Score: 1 Gender Score: 0      CrCl 110 ml/min Platelet count 198  Patient has not had an Afib/aflutter ablation or Watchman within the last 3 months or DCCV within the last 30 days   Per office protocol, patient can hold Eliquis  for 2 days prior to procedure.    **This guidance is not considered finalized until pre-operative APP has relayed final recommendations.**

## 2024-02-13 ENCOUNTER — Ambulatory Visit (INDEPENDENT_AMBULATORY_CARE_PROVIDER_SITE_OTHER): Payer: Medicare PPO

## 2024-02-13 DIAGNOSIS — I480 Paroxysmal atrial fibrillation: Secondary | ICD-10-CM | POA: Diagnosis not present

## 2024-02-15 LAB — CUP PACEART REMOTE DEVICE CHECK
Date Time Interrogation Session: 20250915152053
Implantable Lead Connection Status: 753985
Implantable Lead Connection Status: 753985
Implantable Lead Implant Date: 20201221
Implantable Lead Implant Date: 20201221
Implantable Lead Location: 753859
Implantable Lead Location: 753860
Implantable Lead Model: 377169
Implantable Lead Model: 377171
Implantable Lead Serial Number: 7000011949
Implantable Lead Serial Number: 81083606
Implantable Pulse Generator Implant Date: 20201221
Pulse Gen Model: 407145
Pulse Gen Serial Number: 69703841

## 2024-02-17 ENCOUNTER — Ambulatory Visit: Payer: Self-pay | Admitting: Internal Medicine

## 2024-02-19 ENCOUNTER — Other Ambulatory Visit: Payer: Self-pay | Admitting: Cardiology

## 2024-02-19 DIAGNOSIS — I10 Essential (primary) hypertension: Secondary | ICD-10-CM

## 2024-02-19 DIAGNOSIS — I5032 Chronic diastolic (congestive) heart failure: Secondary | ICD-10-CM

## 2024-02-19 DIAGNOSIS — I2510 Atherosclerotic heart disease of native coronary artery without angina pectoris: Secondary | ICD-10-CM

## 2024-02-19 DIAGNOSIS — I495 Sick sinus syndrome: Secondary | ICD-10-CM

## 2024-02-19 DIAGNOSIS — I480 Paroxysmal atrial fibrillation: Secondary | ICD-10-CM

## 2024-02-19 DIAGNOSIS — Z950 Presence of cardiac pacemaker: Secondary | ICD-10-CM

## 2024-02-20 ENCOUNTER — Other Ambulatory Visit: Payer: Self-pay

## 2024-02-20 DIAGNOSIS — I480 Paroxysmal atrial fibrillation: Secondary | ICD-10-CM

## 2024-02-20 DIAGNOSIS — I495 Sick sinus syndrome: Secondary | ICD-10-CM

## 2024-02-20 DIAGNOSIS — Z950 Presence of cardiac pacemaker: Secondary | ICD-10-CM

## 2024-02-20 DIAGNOSIS — I10 Essential (primary) hypertension: Secondary | ICD-10-CM

## 2024-02-20 DIAGNOSIS — I5032 Chronic diastolic (congestive) heart failure: Secondary | ICD-10-CM

## 2024-02-20 DIAGNOSIS — I2510 Atherosclerotic heart disease of native coronary artery without angina pectoris: Secondary | ICD-10-CM

## 2024-02-20 MED ORDER — APIXABAN 5 MG PO TABS: 5.0000 mg | ORAL_TABLET | Freq: Two times a day (BID) | ORAL | 3 refills | Status: AC

## 2024-02-20 NOTE — Progress Notes (Signed)
 Remote PPM Transmission

## 2024-02-20 NOTE — Telephone Encounter (Signed)
 Prescription refill request for Eliquis  received. Indication:afib Last office visit:6/25 Scr:1.02  10/24 Age: 73 Weight:117  kg  Prescription refilled

## 2024-02-23 DIAGNOSIS — K317 Polyp of stomach and duodenum: Secondary | ICD-10-CM | POA: Diagnosis not present

## 2024-02-23 DIAGNOSIS — K222 Esophageal obstruction: Secondary | ICD-10-CM | POA: Diagnosis not present

## 2024-02-23 DIAGNOSIS — R1310 Dysphagia, unspecified: Secondary | ICD-10-CM | POA: Diagnosis not present

## 2024-02-23 DIAGNOSIS — K449 Diaphragmatic hernia without obstruction or gangrene: Secondary | ICD-10-CM | POA: Diagnosis not present

## 2024-02-23 DIAGNOSIS — R933 Abnormal findings on diagnostic imaging of other parts of digestive tract: Secondary | ICD-10-CM | POA: Diagnosis not present

## 2024-02-23 DIAGNOSIS — K2930 Chronic superficial gastritis without bleeding: Secondary | ICD-10-CM | POA: Diagnosis not present

## 2024-02-23 DIAGNOSIS — K2950 Unspecified chronic gastritis without bleeding: Secondary | ICD-10-CM | POA: Diagnosis not present

## 2024-03-02 DIAGNOSIS — K317 Polyp of stomach and duodenum: Secondary | ICD-10-CM | POA: Diagnosis not present

## 2024-03-02 DIAGNOSIS — K2930 Chronic superficial gastritis without bleeding: Secondary | ICD-10-CM | POA: Diagnosis not present

## 2024-03-07 DIAGNOSIS — R0981 Nasal congestion: Secondary | ICD-10-CM | POA: Diagnosis not present

## 2024-03-07 DIAGNOSIS — Z6839 Body mass index (BMI) 39.0-39.9, adult: Secondary | ICD-10-CM | POA: Diagnosis not present

## 2024-03-07 DIAGNOSIS — M4800 Spinal stenosis, site unspecified: Secondary | ICD-10-CM | POA: Diagnosis not present

## 2024-03-14 ENCOUNTER — Encounter (INDEPENDENT_AMBULATORY_CARE_PROVIDER_SITE_OTHER): Payer: Self-pay

## 2024-03-14 ENCOUNTER — Ambulatory Visit (INDEPENDENT_AMBULATORY_CARE_PROVIDER_SITE_OTHER)

## 2024-03-14 VITALS — BP 118/79 | HR 66 | Ht 71.0 in | Wt 260.0 lb

## 2024-03-14 DIAGNOSIS — R053 Chronic cough: Secondary | ICD-10-CM | POA: Diagnosis not present

## 2024-03-14 DIAGNOSIS — G629 Polyneuropathy, unspecified: Secondary | ICD-10-CM | POA: Diagnosis not present

## 2024-03-14 DIAGNOSIS — R058 Other specified cough: Secondary | ICD-10-CM

## 2024-03-14 DIAGNOSIS — R09A2 Foreign body sensation, throat: Secondary | ICD-10-CM | POA: Diagnosis not present

## 2024-03-14 DIAGNOSIS — R0981 Nasal congestion: Secondary | ICD-10-CM | POA: Diagnosis not present

## 2024-03-14 NOTE — Progress Notes (Signed)
 Dear Dr. Okey, Here is my assessment for our mutual patient, Blake Carter. Thank you for allowing me the opportunity to care for your patient. Please do not hesitate to contact me should you have any other questions. Sincerely, Dr. Hadassah Parody  Otolaryngology Clinic Note Referring provider: Dr. Okey HPI:   Initial HPI (03/14/2024) Discussed the use of AI scribe software for clinical note transcription with the patient, who gave verbal consent to proceed.  Hacking cough ever since march  Feels like something in his throat Lot of sinus drainage  No major allergies  Flonase and mucinex help it Not a lot of throat clearing but feels like he has to cough something up  Radiation to neck  History of Present Illness Blake Carter is a 73 year old male with advanced stage prostate cancer who presents with chronic sinus congestion and a persistent cough.  Chronic sinonasal congestion and postnasal drainage - Persistent since March - Mucus primarily clear or white, occasionally tinged with yellow - Congestion centered in the throat area - No significant changes with seasonal variation - No frequent throat clearing, but need to expectorate mucus - No current allergies; previous allergy evaluations negative  Globus sensation  - Persistent 'hacking cough' accompanying sinonasal symptoms - Feels like something caught in his throat   Response to prior treatments and medication side effects - Flonase and Mucinex have provided symptom relief - Flonase discontinued due to eye dryness and blurry vision  Relevant surgical history - Tonsillectomy and adenoidectomy in 1954 or 1955 for recurrent infections - History of ear myringotomy procedures for childhood ear infections  Hx of radiation to neck for prostate ca    Independent Review of Additional Tests or Records:  Referral note Dale Okey, MD 03/08/24: ENT referral ongoing drainage not responding to antibiotics   BMP 03/18/23:  BUN 31, Cr 1.02 PMH/Meds/All/SocHx/FamHx/ROS:   Past Medical History:  Diagnosis Date   Allergic rhinitis    Aortic atherosclerosis    Arthritis    a little bit in some of my joints (06/20/2014)   Ascending aorta dilatation    4.1cm ascending aorta by CT 11/2023   Chronic diastolic CHF (congestive heart failure) (HCC)    Coronary artery disease    a. 05/2014 Cath/PCI: LM nl, LAD 40p, 60-70d (FFR 0.80->3.0x24 Synergy DES), D2 40, LCX 90d (2.25x12 Synergy DES), RCA 20-57m, EF 60-65%. b. Cath 02/2017 without acute change, elev LVEDP suggestive of dCHF.   Depression    Dilated aortic root    aortic root at 44mm by Chest CTA 11/2019   ED (erectile dysfunction)    Environmental allergies    dry cough   GERD (gastroesophageal reflux disease)    History of hiatal hernia    Hydronephrosis of right kidney    a. s/p ureteral stenting in the past.   Hyperlipidemia    under control   Hypertension    Hypothyroidism    OCD (obsessive compulsive disorder)    Pacemaker    PAF (paroxysmal atrial fibrillation) (HCC)    Panic disorder    PAT (paroxysmal atrial tachycardia)    PONV (postoperative nausea and vomiting)    after thyroid  surgery no problems in 10 years    Prostate cancer (HCC)    prostate cancer- robotic prostatectomy - followed by radiation because of positive lymph node--and pt on lupron  injections   PVC's (premature ventricular contractions)    Sinus bradycardia    a. chronic, asymptomatic.  24 hour Holter 10/2016 showed Sinus bradycardia, sinus  arrhythmia and normal sinus rhythm with average heart rate 50bpm and heart rate ranged from 32 to 100bpm.   SUI (stress urinary incontinence), male    S/P ROBOTIC PROSTATECTOMY AND RADIATION TX     Past Surgical History:  Procedure Laterality Date   COLONOSCOPY  07/2013   Dr. Celestia   CORONARY ANGIOPLASTY WITH STENT PLACEMENT  06/20/2014   2   CORONARY PRESSURE/FFR STUDY N/A 07/08/2020   Procedure: INTRAVASCULAR PRESSURE WIRE/FFR  STUDY;  Surgeon: Claudene Victory ORN, MD;  Location: [facility] INVASIVE CV LAB;  Service: Cardiovascular;  Laterality: N/A;   CYSTOSCOPY  11/30/2011   Procedure: CYSTOSCOPY;  Surgeon: Glendia DELENA Elizabeth, MD;  Location: [facility];  Service: Urology;  Laterality: N/A;  Inplantation of Artificial Sphincter and Cystoscopy   CYSTOSCOPY WITH RETROGRADE PYELOGRAM, URETEROSCOPY AND STENT PLACEMENT Right 02/11/2014   Procedure: CYSTOSCOPY WITH RETROGRADE PYELOGRAM, URETEROSCOPY ,URETERAL BIOPSY AND STENT PLACEMENT;  Surgeon: Gretel Ferrara, MD;  Location: [facility];  Service: Urology;  Laterality: Right;   CYSTOSCOPY WITH RETROGRADE PYELOGRAM, URETEROSCOPY AND STENT PLACEMENT Right 04/15/2014   Procedure: CYSTOSCOPY WITH RETROGRADE PYELOGRAM, URETEROSCOPY, BIOPSY AND STENT PLACEMENT;  Surgeon: Gretel Ferrara, MD;  Location: [facility];  Service: Urology;  Laterality: Right;   LEFT HEART CATH AND CORONARY ANGIOGRAPHY N/A 03/29/2017   Procedure: LEFT HEART CATH AND CORONARY ANGIOGRAPHY;  Surgeon: Anner Alm ORN, MD;  Location: [facility] INVASIVE CV LAB;  Service: Cardiovascular;  Laterality: N/A;   LEFT HEART CATHETERIZATION WITH CORONARY ANGIOGRAM N/A 06/20/2014   Procedure: LEFT HEART CATHETERIZATION WITH CORONARY ANGIOGRAM;  Surgeon: Alm ORN Anner, MD;  Location: [facility] CATH LAB;  Service: Cardiovascular;  Laterality: N/A;   PACEMAKER IMPLANT N/A 05/21/2019   Procedure: PACEMAKER IMPLANT;  Surgeon: Waddell Danelle ORN, MD;  Location: [facility] INVAS[facility] CV [facility];  Service: Cardiovascular;  Laterality: N/A;   PERCUTANEOUS CORONARY STENT INTERVENTION (PCI-S)  06/20/2014   Procedure: PERCUTANEOUS CORONARY STENT INTERVENTION (PCI-S);  Surgeon: Alm ORN Anner, MD;  Location: [facility] CATH LAB;  Service: Cardiovascular;;  LAD and Circumflex   REFRACTIVE SURGERY Bilateral 2004   RIGHT/LEFT HEART CATH AND CORONARY ANGIOGRAPHY N/A 07/08/2020   Procedure: RIGHT/LEFT HEART CATH AND CORONARY ANGIOGRAPHY;  Surgeon: Claudene Victory ORN, MD;  Location: [facility] INVASIVE CV LAB;  Service:  Cardiovascular;  Laterality: N/A;   ROBOT ASSISTED LAPAROSCOPIC RADICAL PROSTATECTOMY  05/2010   THYROIDECTOMY, PARTIAL  10/1974   TONSILLECTOMY  1956    TOTAL THYROIDECTOMY  10/2012   nuked it   UPPER GI ENDOSCOPY     food impaction 2009 done by Dr Celestia   URINARY SPHINCTER IMPLANT  11/30/2011   Procedure: ARTIFICIAL URINARY SPHINCTER;  Surgeon: Glendia DELENA Elizabeth, MD;  Location: [facility];  Service: Urology;  Laterality: N/A;    Family History  Problem Relation Age of Onset   Cancer Father    Hyperlipidemia Father    Heart disease Father        Father had CABG/aorta repair by the time he was in his 55s   CAD Brother        Brother who is 10 years younger has had some sort of stenting     Social Connections: Unknown (10/13/2021)   Received from [facility]   Social Network    Social Network: Not on file     Current Outpatient Medications  Medication Instructions   acetaminophen  (TYLENOL ) 1,000 mg, Every 6 hours PRN   ALPRAZolam  (XANAX ) 0.25 mg, Oral, 2 times daily PRN   amLODipine  (NORVASC ) 5 mg, Oral, Daily  apixaban  (ELIQUIS ) 5 mg, Oral, 2 times daily   diazepam  (VALIUM ) 5 mg, Oral, Daily PRN, for anxiety   fenofibrate  (TRICOR ) 145 mg, Oral, Daily   fluvoxaMINE  (LUVOX ) 100 MG tablet TAKE 3 TABLETS (300 MG TOTAL) DAILY.   furosemide  (LASIX ) 20 MG tablet TAKE 1 TABLET BY MOUTH DAILY AS NEEDED FOR FLUID OR EDEMA   latanoprost (XALATAN) 0.005 % ophthalmic solution 1 drop, Daily at bedtime   levothyroxine  (SYNTHROID ) 150 mcg, Daily before breakfast   lisinopril  (ZESTRIL ) 40 mg, Oral, Daily   Lupron  Depot (11-Month) 30 mg, Every 6 months   metFORMIN (GLUCOPHAGE-XR) 500 MG 24 hr tablet 1 tablet, 2 times daily with meals   metoprolol  succinate (TOPROL -XL) 100 mg, Oral, Daily, TAKE WITH OR IMMEDIATELY FOLLOWING A MEAL.   nitroGLYCERIN  (NITROSTAT ) 0.4 MG SL tablet PLACE 1 TABLET UNDER THE TONGUE EVERY 5 MINUTES AS NEEDED FOR CHEST PAIN.   pantoprazole  (PROTONIX ) 80 mg, Oral, Daily    pregabalin (LYRICA) 100 mg, 2 times daily   rosuvastatin  (CRESTOR ) 40 mg, Oral, Daily     Physical Exam:   BP 118/79 (BP Location: Left Arm, Patient Position: Sitting)   Pulse 66   Ht 5' 11 (1.803 m)   Wt 260 lb (117.9 kg)   SpO2 (!) 89%   BMI 36.26 kg/m   Salient findings:  CN II-XII intact Bilateral EAC clear and TM intact with well pneumatized middle ear spaces Anterior rhinoscopy: Septum midline TFL was indicated to better evaluate the proximal airway, given the patient's history and exam findings, and is detailed below. No lesions of oral cavity/oropharynx No obviously palpable neck masses/lymphadenopathy/thyromegaly No respiratory distress or stridor  Seprately Identifiable Procedures:  Prior to initiating any procedures, risks/benefits/alternatives were explained to the patient and verbal consent obtained.  Procedure Note (03/14/2024) Pre-procedure diagnosis:  Globus sensation, chronic cough Post-procedure diagnosis: Same Procedure: Transnasal Fiberoptic Laryngoscopy, CPT 31575 - Mod 25 Indication: globus sensation, chronic cough  Complications: None apparent EBL: 0 mL  The procedure was undertaken to further evaluate the patient's complaint of globus sensation and chronic cough, with mirror exam inadequate for appropriate examination due to gag reflex and poor patient tolerance  Procedure:  Patient was identified as correct patient. Verbal consent was obtained. The nose was sprayed with oxymetazoline and 4% lidocaine . The The flexible laryngoscope was passed through the nose to view the nasal cavity, pharynx (oropharynx, hypopharynx) and larynx.  The larynx was examined at rest and during multiple phonatory tasks. Documentation was obtained and reviewed with patient. The scope was removed. The patient tolerated the procedure well.  Findings: The nasal cavity and nasopharynx did not reveal any masses or lesions, mucosa appeared to be without obvious lesions. The  tongue base, pharyngeal walls, piriform sinuses, vallecula, epiglottis and postcricoid region are normal in appearance. The visualized portion of the subglottis and proximal trachea is widely patent. The vocal folds are mobile bilaterally. There are no lesions on the free edge of the vocal folds nor elsewhere in the larynx worrisome for malignancy.    Electronically signed by: Hadassah JAYSON Parody, MD 03/14/2024 9:33 AM   Impression & Plans:  Cheryl Stabenow is a 73 y.o. male with   1. Upper airway cough syndrome   2. Chronic cough   3. Globus sensation   4. 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 No malignancy or  structural abnormalities. - Reassured no evidence of malignancy or structural abnormalities. - Advised this is not dangerous and does not require treatment unless bothersome. - Discussed cough suppression therapy if symptoms become intolerable. - He does not want to pursue any additional treatments for this and was appreciative of reassurance  - No follow-up needed unless symptoms worsen or new concerns arise.  Nasal congestion - This is less bothersome to him compared to globus sensation and coughing. He is comfortable continuing to manage with conservative treatments    See below regarding exact medications prescribed this encounter including dosages and route: No orders of the defined types were placed in this encounter.     Thank you for allowing me the opportunity to care for your patient. Please do not hesitate to contact me should you have any other questions.  Sincerely, Hadassah Parody, MD Otolaryngologist (ENT), [facility] Phone: [phone] Fax: [phone]

## 2024-04-03 NOTE — Patient Instructions (Signed)
 SURGICAL WAITING ROOM VISITATION  Patients having surgery or a procedure may have no more than 2 support people in the waiting area - these visitors may rotate.    Children under the age of 12 must have an adult with them who is not the patient.  Visitors with respiratory illnesses are discouraged from visiting and should remain at home.  If the patient needs to stay at the hospital during part of their recovery, the visitor guidelines for inpatient rooms apply. Pre-op nurse will coordinate an appropriate time for 1 support person to accompany patient in pre-op.  This support person may not rotate.    Please refer to the [facility] website for the visitor guidelines for Inpatients (after your surgery is over and you are in a regular room).    Your procedure is scheduled on: 04/11/24   Report to [facility] Main Entrance    Report to admitting at 6:15 AM   Call this number if you have problems the morning of surgery [phone]   Do not eat food or drink liquids :After Midnight          If you have questions, please contact your surgeon's office.   FOLLOW BOWEL PREP AND ANY ADDITIONAL PRE OP INSTRUCTIONS YOU RECEIVED FROM YOUR SURGEON'S OFFICE!!!     Oral Hygiene is also important to reduce your risk of infection.                                    Remember - BRUSH YOUR TEETH THE MORNING OF SURGERY WITH YOUR REGULAR TOOTHPASTE  DENTURES WILL BE REMOVED PRIOR TO SURGERY PLEASE DO NOT APPLY Poly grip OR ADHESIVES!!!   Stop all vitamins and herbal supplements 7 days before surgery.   Take these medicines the morning of surgery with A SIP OF WATER : Tylenol , Amlodipine , Fenofibrate , Luvox , Levothyroxine , Metoprolol , Pantoprazole , Lyrica, Rosuvastatin                                You may not have any metal on your body including jewelry, and body piercing             Do not wear lotions, powders, cologne, or deodorant              Men may shave face and neck.   Do  not bring valuables to the hospital. [facility] IS NOT             RESPONSIBLE   FOR VALUABLES.   Contacts, glasses, dentures or bridgework may not be worn into surgery.  DO NOT BRING YOUR HOME MEDICATIONS TO THE HOSPITAL. PHARMACY WILL DISPENSE MEDICATIONS LISTED ON YOUR MEDICATION LIST TO YOU DURING YOUR ADMISSION IN THE HOSPITAL!    Patients discharged on the day of surgery will not be allowed to drive home.  Someone NEEDS to stay with you for the first 24 hours after anesthesia.              Please read over the following fact sheets you were given: IF YOU HAVE QUESTIONS ABOUT YOUR PRE-OP INSTRUCTIONS PLEASE CALL [phone].   If you received a COVID test during your pre-op visit  it is requested that you wear a mask when out in public, stay away from anyone that may not be feeling well and notify your surgeon if you develop symptoms. If you  test positive for Covid or have been in contact with anyone that has tested positive in the last 10 days please notify you surgeon.    [facility] - Preparing for Surgery Before surgery, you can play an important role.  Because skin is not sterile, your skin needs to be as free of germs as possible.  You can reduce the number of germs on your skin by washing with CHG (chlorahexidine gluconate) soap before surgery.  CHG is an antiseptic cleaner which kills germs and bonds with the skin to continue killing germs even after washing. Please DO NOT use if you have an allergy to CHG or antibacterial soaps.  If your skin becomes reddened/irritated stop using the CHG and inform your nurse when you arrive at Short Stay. Do not shave (including legs and underarms) for at least 48 hours prior to the first CHG shower.  You may shave your face/neck.  Please follow these instructions carefully:  1.  Shower with CHG Soap the night before surgery ONLY (DO NOT USE THE SOAP THE MORNING OF SURGERY).  2.  If you choose to wash your hair, wash your hair first as  usual with your normal  shampoo.  3.  After you shampoo, rinse your hair and body thoroughly to remove the shampoo.                             4.  Use CHG as you would any other liquid soap.  You can apply chg directly to the skin and wash.  Gently with a scrungie or clean washcloth.  5.  Apply the CHG Soap to your body ONLY FROM THE NECK DOWN.   Do   not use on face/ open                           Wound or open sores. Avoid contact with eyes, ears mouth and   genitals (private parts).                       Wash face,  Genitals (private parts) with your normal soap.             6.  Wash thoroughly, paying special attention to the area where your    surgery  will be performed.  7.  Thoroughly rinse your body with warm water  from the neck down.  8.  DO NOT shower/wash with your normal soap after using and rinsing off the CHG Soap.                9.  Pat yourself dry with a clean towel.            10.  Wear clean pajamas.            11.  Place clean sheets on your bed the night of your first shower and do not  sleep with pets. Day of Surgery : Do not apply any CHG, lotions/deodorants the morning of surgery.  Please wear clean clothes to the hospital/surgery center.  FAILURE TO FOLLOW THESE INSTRUCTIONS MAY RESULT IN THE CANCELLATION OF YOUR SURGERY  PATIENT SIGNATURE_________________________________  NURSE SIGNATURE__________________________________  ________________________________________________________________________

## 2024-04-03 NOTE — Progress Notes (Signed)
 Patient states if he needs a cath he will need Dr. Renda or MacDiramid to perform due to sphincter    COVID Vaccine Completed: yes  Date of COVID positive in last 90 days:  PCP - Carlin Gull, MD Cardiologist - Rojelio Bihari, MD Electrophysiologist- Danelle Birmingham, MD Urologist- Dr, Renda Endocrinologist- Dr Tommas   Cardiac clearance 02/08/24 in Epic   Chest x-ray - CT- 07/08/23 Epic EKG - 09/21/23 Epic Stress Test - 12/21/23 Epic ECHO - 11/01/23 Epic Cardiac Cath - 07/08/20 Epic  Pacemaker/ICD device last checked: 02/13/24 Epic order requested via Epic Spinal Cord Stimulator:N/A  Bowel Prep - N/A  Sleep Study - N/A CPAP -   Fasting Blood Sugar - preDM, no checks at home Checks Blood Sugar _____ times a day  Last dose of GLP1 agonist-  N/A GLP1 instructions:  Do not take after     Last dose of SGLT-2 inhibitors-  N/A SGLT-2 instructions:  Do not take after     Blood Thinner Instructions: Eliquis - hold 2 days Aspirin  Instructions:N/A Last Dose: 04/08/24 0900  Activity level: Can go up a flight of stairs and perform activities of daily living without stopping and without symptoms of chest pain or shortness of breath. Not a lot of activity due to arthritis in knees  Anesthesia review: HTN, CAD, CHF, a fib, SOB, anemia, stents x2  Patient denies shortness of breath, fever, cough and chest pain at PAT appointment  Patient verbalized understanding of instructions that were given to them at the PAT appointment. Patient was also instructed that they will need to review over the PAT instructions again at home before surgery.

## 2024-04-04 ENCOUNTER — Other Ambulatory Visit: Payer: Self-pay

## 2024-04-04 ENCOUNTER — Encounter (HOSPITAL_COMMUNITY): Payer: Self-pay

## 2024-04-04 ENCOUNTER — Encounter (HOSPITAL_COMMUNITY)
Admission: RE | Admit: 2024-04-04 | Discharge: 2024-04-04 | Disposition: A | Discharge status: Home / Self Care | Source: Ambulatory Visit | Attending: General Surgery | Admitting: General Surgery

## 2024-04-04 DIAGNOSIS — I5032 Chronic diastolic (congestive) heart failure: Secondary | ICD-10-CM | POA: Insufficient documentation

## 2024-04-04 DIAGNOSIS — E039 Hypothyroidism, unspecified: Secondary | ICD-10-CM | POA: Diagnosis not present

## 2024-04-04 DIAGNOSIS — Z8546 Personal history of malignant neoplasm of prostate: Secondary | ICD-10-CM | POA: Insufficient documentation

## 2024-04-04 DIAGNOSIS — R053 Chronic cough: Secondary | ICD-10-CM | POA: Diagnosis not present

## 2024-04-04 DIAGNOSIS — I480 Paroxysmal atrial fibrillation: Secondary | ICD-10-CM | POA: Insufficient documentation

## 2024-04-04 DIAGNOSIS — I2510 Atherosclerotic heart disease of native coronary artery without angina pectoris: Secondary | ICD-10-CM | POA: Insufficient documentation

## 2024-04-04 DIAGNOSIS — Z955 Presence of coronary angioplasty implant and graft: Secondary | ICD-10-CM | POA: Diagnosis not present

## 2024-04-04 DIAGNOSIS — Z950 Presence of cardiac pacemaker: Secondary | ICD-10-CM | POA: Insufficient documentation

## 2024-04-04 DIAGNOSIS — I351 Nonrheumatic aortic (valve) insufficiency: Secondary | ICD-10-CM | POA: Diagnosis not present

## 2024-04-04 DIAGNOSIS — Z01812 Encounter for preprocedural laboratory examination: Secondary | ICD-10-CM | POA: Diagnosis not present

## 2024-04-04 DIAGNOSIS — I110 Hypertensive heart disease with heart failure: Secondary | ICD-10-CM | POA: Diagnosis not present

## 2024-04-04 DIAGNOSIS — K8050 Calculus of bile duct without cholangitis or cholecystitis without obstruction: Secondary | ICD-10-CM | POA: Insufficient documentation

## 2024-04-04 HISTORY — DX: Malignant neoplasm of cervix uteri, unspecified: C53.9

## 2024-04-04 HISTORY — DX: Fatty (change of) liver, not elsewhere classified: K76.0

## 2024-04-04 LAB — CBC WITH DIFFERENTIAL/PLATELET
Abs Immature Granulocytes: 0.02 K/uL (ref 0.00–0.07)
Basophils Absolute: 0 K/uL (ref 0.0–0.1)
Basophils Relative: 0 %
Eosinophils Absolute: 0.1 K/uL (ref 0.0–0.5)
Eosinophils Relative: 3 %
HCT: 37.7 % — ABNORMAL LOW (ref 39.0–52.0)
Hemoglobin: 11.3 g/dL — ABNORMAL LOW (ref 13.0–17.0)
Immature Granulocytes: 0 %
Lymphocytes Relative: 22 %
Lymphs Abs: 1 K/uL (ref 0.7–4.0)
MCH: 26.8 pg (ref 26.0–34.0)
MCHC: 30 g/dL (ref 30.0–36.0)
MCV: 89.3 fL (ref 80.0–100.0)
Monocytes Absolute: 0.4 K/uL (ref 0.1–1.0)
Monocytes Relative: 8 %
Neutro Abs: 3 K/uL (ref 1.7–7.7)
Neutrophils Relative %: 67 %
Platelets: 287 K/uL (ref 150–400)
RBC: 4.22 MIL/uL (ref 4.22–5.81)
RDW: 14.5 % (ref 11.5–15.5)
WBC: 4.5 K/uL (ref 4.0–10.5)
nRBC: 0 % (ref 0.0–0.2)

## 2024-04-04 LAB — PROTIME-INR
INR: 1.1 (ref 0.8–1.2)
Prothrombin Time: 15.3 s — ABNORMAL HIGH (ref 11.4–15.2)

## 2024-04-04 LAB — COMPREHENSIVE METABOLIC PANEL WITH GFR
ALT: 35 U/L (ref 0–44)
AST: 37 U/L (ref 15–41)
Albumin: 4.4 g/dL (ref 3.5–5.0)
Alkaline Phosphatase: 54 U/L (ref 38–126)
Anion gap: 11 (ref 5–15)
BUN: 22 mg/dL (ref 8–23)
CO2: 23 mmol/L (ref 22–32)
Calcium: 10 mg/dL (ref 8.9–10.3)
Chloride: 104 mmol/L (ref 98–111)
Creatinine, Ser: 0.97 mg/dL (ref 0.61–1.24)
GFR, Estimated: >60 mL/min (ref >60)
Glucose, Bld: 104 mg/dL — ABNORMAL HIGH (ref 70–99)
Potassium: 4.4 mmol/L (ref 3.5–5.1)
Sodium: 138 mmol/L (ref 135–145)
Total Bilirubin: 0.4 mg/dL (ref 0.0–1.2)
Total Protein: 7.6 g/dL (ref 6.5–8.1)

## 2024-04-05 NOTE — Progress Notes (Addendum)
 Anesthesia Chart Review   Case: [id number] Date/Time: 04/11/24 0815   Procedure: LAPAROSCOPIC CHOLECYSTECTOMY   Anesthesia type: General   Diagnosis: Biliary colic [K80.50]   Pre-op diagnosis: BILIARY COLIC   [facility]: [facility] / [facility]   Surgeons: Ann Fine, MD       DISCUSSION:73 y.o. never smoker with h/o PONV, HTN, hypothyroidism, PAF, CAD, CHF, symptomatic bradycardia status post dual-chamber pacemaker 05/2019, prostate cancer, biliary colic scheduled for above procedure 04/11/24 with Dr. Fine Ann.   Pt with chronic cough since March, seen by ENT 03/14/2024. 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 No follow up with ENT recommended unless sx change.   Per cardiology preoperative evaluation 02/08/2024, Mr. Swider perioperative risk of a major cardiac event is 6.6% according to the Revised Cardiac Risk Index (RCRI).  Therefore, he is at high risk for perioperative complications.   His functional capacity is good at 6.55 METs according to the Duke Activity Status Index (DASI). Recommendations: According to ACC/AHA guidelines, no further cardiovascular testing needed.  The patient may proceed to surgery at acceptable risk.   Antiplatelet and/or Anticoagulation Recommendations:   Eliquis  (Apixaban ) can be held for 2 days prior to surgery.  Please resume post op when felt to be safe.  VS: BP (!) 125/90   Pulse 66   Temp 36.7 C (Oral)   Resp 16   Ht 5' 11 (1.803 m)   Wt 121.6 kg   SpO2 95%   BMI 37.38 kg/m   PROVIDERS: Okey Carlin Redbird, MD is PCP   Cardiologist - Rojelio Bihari, MD  LABS: Labs reviewed: Acceptable for surgery. (all labs ordered are listed, but only abnormal results are displayed)  Labs Reviewed  COMPREHENSIVE METABOLIC PANEL WITH GFR - Abnormal; Notable for the following components:      Result Value   Glucose, Bld 104 (*)    All other components within normal limits  PROTIME-INR - Abnormal; Notable for the  following components:   Prothrombin Time 15.3 (*)    All other components within normal limits  CBC WITH DIFFERENTIAL/PLATELET - Abnormal; Notable for the following components:   Hemoglobin 11.3 (*)    HCT 37.7 (*)    All other components within normal limits     IMAGES:   EKG:   CV: Echo 11/01/2023 1. Left ventricular ejection fraction, by estimation, is 55%. The left  ventricle has normal function. The left ventricle has no regional wall  motion abnormalities. Left ventricular diastolic parameters are consistent  with Grade I diastolic dysfunction  (impaired relaxation).   2. Peak RV-RA gradient 21 mmHg. Right ventricular systolic function is  normal. The right ventricular size is normal.   3. The mitral valve is normal in structure. Trivial mitral valve  regurgitation. No evidence of mitral stenosis.   4. The aortic valve is tricuspid. Aortic valve regurgitation is mild. No  aortic stenosis is present.   5. Aortic dilatation noted. There is mild dilatation of the aortic root,  measuring 43 mm. There is severe dilatation of the ascending aorta,  measuring 44 mm.   6. The IVC was not visualized.   Cardiac Cath 07/08/2020 Widely patent LAD and circumflex stents. Widely patent left main Widely patent circumflex. The left anterior descending contains 30% stenosis proximal to the mid stent.  Hemodynamic assessment was performed with the Pressure Wire X demonstrating an RFR of 0.9 and an FFR of 0.82.  A full hemodynamic assessment to exclude microvascular dysfunction was performed.  GFR  was 3.6 and INR was 12.0. Right coronary is widely patent with luminal irregularities.  There is diffuse disease in the PDA but without focal high-grade obstruction. Left ventricular size is normal.  EF is 55%.  EDP is normal. Right heart pressures are normal.   RECOMMENDATIONS:   No evidence of significant epicardial disease or microvascular dysfunction. Continue aggressive risk factor  modification. Initiate an exercise program and weight loss. Further management per treatment team. Past Medical History:  Diagnosis Date   Allergic rhinitis    Anemia associated with cervical cancer treated with erythropoietin (HCC)    Aortic atherosclerosis    Arthritis    a little bit in some of my joints (06/20/2014)   Ascending aorta dilatation    4.1cm ascending aorta by CT 11/2023   Chronic diastolic CHF (congestive heart failure) (HCC)    Coronary artery disease    a. 05/2014 Cath/PCI: LM nl, LAD 40p, 60-70d (FFR 0.80->3.0x24 Synergy DES), D2 40, LCX 90d (2.25x12 Synergy DES), RCA 20-20m, EF 60-65%. b. Cath 02/2017 without acute change, elev LVEDP suggestive of dCHF.   Depression    Dilated aortic root    aortic root at 44mm by Chest CTA 11/2019   ED (erectile dysfunction)    Environmental allergies    dry cough   Fatty liver    GERD (gastroesophageal reflux disease)    History of hiatal hernia    Hydronephrosis of right kidney    a. s/p ureteral stenting in the past.   Hyperlipidemia    under control   Hypertension    Hypothyroidism    OCD (obsessive compulsive disorder)    Pacemaker    PAF (paroxysmal atrial fibrillation) (HCC)    Panic disorder    PAT (paroxysmal atrial tachycardia)    Pneumonia    PONV (postoperative nausea and vomiting)    after thyroid  surgery no problems in 10 years    Prostate cancer (HCC)    prostate cancer- robotic prostatectomy - followed by radiation because of positive lymph node--and pt on lupron  injections   PVC's (premature ventricular contractions)    Sinus bradycardia    a. chronic, asymptomatic.  24 hour Holter 10/2016 showed Sinus bradycardia, sinus arrhythmia and normal sinus rhythm with average heart rate 50bpm and heart rate ranged from 32 to 100bpm.   SUI (stress urinary incontinence), male    S/P ROBOTIC PROSTATECTOMY AND RADIATION TX    Past Surgical History:  Procedure Laterality Date   COLONOSCOPY  07/29/2013   Dr.  Celestia   CORONARY ANGIOPLASTY WITH STENT PLACEMENT  06/20/2014   2   CORONARY PRESSURE/FFR STUDY N/A 07/08/2020   Procedure: INTRAVASCULAR PRESSURE WIRE/FFR STUDY;  Surgeon: Claudene Victory ORN, MD;  [facility];  Service: Cardiovascular;  Laterality: N/A;   CYSTOSCOPY  11/30/2011   Procedure: CYSTOSCOPY;  Surgeon: Glendia DELENA Elizabeth, MD;  Location: [facility];  Service: Urology;  Laterality: N/A;  Inplantation of Artificial Sphincter and Cystoscopy   CYSTOSCOPY WITH RETROGRADE PYELOGRAM, URETEROSCOPY AND STENT PLACEMENT Right 02/11/2014   Procedure: CYSTOSCOPY WITH RETROGRADE PYELOGRAM, URETEROSCOPY ,URETERAL BIOPSY AND STENT PLACEMENT;  Surgeon: Gretel Ferrara, MD;  Location[facility];  Service: Urology;  Laterality: Right;   CYSTOSCOPY WITH RETROGRADE PYELOGRAM, URETEROSCOPY AND STENT PLACEMENT Right 04/15/2014   Procedure: CYSTOSCOPY WITH RETROGRADE PYELOGRAM, URETEROSCOPY, BIOPSY AND STENT PLACEMENT;  Surgeon: Gretel Ferrara, MD;  [facility];  Service: Urology;  Laterality: Right;   LEFT HEART CATH AND CORONARY ANGIOGRAPHY N/A 03/29/2017   Procedure: LEFT  HEART CATH AND CORONARY ANGIOGRAPHY;  Surgeon: Anner Alm ORN, MD;  Location: [facility] INVASIVE CV LAB;  Service: Cardiovascular;  Laterality: N/A;   LEFT HEART CATHETERIZATION WITH CORONARY ANGIOGRAM N/A 06/20/2014   Procedure: LEFT HEART CATHETERIZATION WITH CORONARY ANGIOGRAM;  Surgeon: Alm ORN Anner, MD;  Location: [facility] CATH LAB;  Service: Cardiovascular;  Laterality: N/A;   PACEMAKER IMPLANT N/A 05/21/2019   Procedure: PACEMAKER IMPLANT;  Surgeon: Waddell Danelle ORN, MD;  [facility];  Service: Cardiovascular;  Laterality: N/A;   PERCUTANEOUS CORONARY STENT INTERVENTION (PCI-S)  06/20/2014   Procedure: PERCUTANEOUS CORONARY STENT INTERVENTION (PCI-S);  Surgeon: Alm ORN Anner, MD;  Location: [facility] CATH LAB;  Service: Cardiovascular;;  LAD and Circumflex   REFRACTIVE SURGERY Bilateral 05/31/2002   RIGHT/LEFT HEART  CATH AND CORONARY ANGIOGRAPHY N/A 07/08/2020   Procedure: RIGHT/LEFT HEART CATH AND CORONARY ANGIOGRAPHY;  Surgeon: Claudene Victory ORN, MD;  [facility];  Service: Cardiovascular;  Laterality: N/A;   ROBOT ASSISTED LAPAROSCOPIC RADICAL PROSTATECTOMY  05/31/2010   THYROIDECTOMY, PARTIAL  10/30/1974   TONSILLECTOMY  05/31/1954   TOTAL THYROIDECTOMY  10/29/2012   nuked it   UPPER GI ENDOSCOPY     food impaction 2009 done by Dr Celestia   UPPER GI ENDOSCOPY     URINARY SPHINCTER IMPLANT  11/30/2011   Procedure: ARTIFICIAL URINARY SPHINCTER;  Surgeon: Glendia DELENA Elizabeth, MD;  [facility];  Service: Urology;  Laterality: N/A;    MEDICATIONS:  acetaminophen  (TYLENOL ) 500 MG tablet   ALPRAZolam  (XANAX ) 0.25 MG tablet   amLODipine  (NORVASC ) 5 MG tablet   apixaban  (ELIQUIS ) 5 MG TABS tablet   diazepam  (VALIUM ) 5 MG tablet   fenofibrate  (TRICOR ) 145 MG tablet   fluvoxaMINE  (LUVOX ) 100 MG tablet   furosemide  (LASIX ) 20 MG tablet   GEMTESA 75 MG TABS   latanoprost (XALATAN) 0.005 % ophthalmic solution   leuprolide  (LUPRON  DEPOT, 52-MONTH,) 30 MG injection   levothyroxine  (SYNTHROID ) 150 MCG tablet   lisinopril  (ZESTRIL ) 40 MG tablet   metFORMIN (GLUCOPHAGE-XR) 500 MG 24 hr tablet   metoprolol  succinate (TOPROL -XL) 100 MG 24 hr tablet   nitroGLYCERIN  (NITROSTAT ) 0.4 MG SL tablet   pantoprazole  (PROTONIX ) 40 MG tablet   pregabalin (LYRICA) 100 MG capsule   rosuvastatin  (CRESTOR ) 40 MG tablet   No current facility-administered medications for this encounter.    Harlene Hoots Ward, PA-C [facility] [phone]

## 2024-04-10 NOTE — Progress Notes (Addendum)
 Called device rep Fredna Balloon) about patient surgery. HE recommendations use of magnet during surgery.

## 2024-04-10 NOTE — Anesthesia Preprocedure Evaluation (Addendum)
 Anesthesia Evaluation  Patient identified by MRN, date of birth, ID band Patient awake    Reviewed: Allergy & Precautions, NPO status , Patient's Chart, lab work & pertinent test results  History of Anesthesia Complications (+) PONV and history of anesthetic complications  Airway Mallampati: II  TM Distance: >3 FB Neck ROM: Full    Dental no notable dental hx.    Pulmonary    Pulmonary exam normal breath sounds clear to auscultation       Cardiovascular METS: 5 - 7 Mets hypertension, (-) angina + CAD, + Cardiac Stents and +CHF  Normal cardiovascular exam+ dysrhythmias Atrial Fibrillation + pacemaker  Rhythm:Regular Rate:Normal  Mildly dilated TAA  TTE: IMPRESSIONS     1. Left ventricular ejection fraction, by estimation, is 55%. The left  ventricle has normal function. The left ventricle has no regional wall  motion abnormalities. Left ventricular diastolic parameters are consistent  with Grade I diastolic dysfunction  (impaired relaxation).   2. Peak RV-RA gradient 21 mmHg. Right ventricular systolic function is  normal. The right ventricular size is normal.   3. The mitral valve is normal in structure. Trivial mitral valve  regurgitation. No evidence of mitral stenosis.   4. The aortic valve is tricuspid. Aortic valve regurgitation is mild. No  aortic stenosis is present.   5. Aortic dilatation noted. There is mild dilatation of the aortic root,  measuring 43 mm. There is severe dilatation of the ascending aorta,  measuring 44 mm.   6. The IVC was not visualized.     Neuro/Psych neg Seizures PSYCHIATRIC DISORDERS Anxiety Depression       GI/Hepatic hiatal hernia,GERD  ,,  Endo/Other  Hypothyroidism    Renal/GU CRFRenal disease     Musculoskeletal  (+) Arthritis ,    Abdominal   Peds  Hematology   Anesthesia Other Findings   Reproductive/Obstetrics                               Anesthesia Physical Anesthesia Plan  ASA: 3  Anesthesia Plan: General   Post-op Pain Management:    Induction: Intravenous  PONV Risk Score and Plan: 3 and Ondansetron , Dexamethasone  and Treatment may vary due to age or medical condition  Airway Management Planned: Oral ETT  Additional Equipment:   Intra-op Plan:   Post-operative Plan: Extubation in OR  Informed Consent: I have reviewed the patients History and Physical, chart, labs and discussed the procedure including the risks, benefits and alternatives for the proposed anesthesia with the patient or authorized representative who has indicated his/her understanding and acceptance.     Dental advisory given  Plan Discussed with: CRNA  Anesthesia Plan Comments: (See PAT note 04/04/2024  73 y.o. 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 Richerd Silversmith.    Pt with chronic cough since March, seen by ENT 03/14/2024. 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 No follow up with ENT recommended unless sx change.    Per cardiology preoperative evaluation 02/08/2024, Mr. Warwick perioperative risk of a major cardiac event is 6.6% according to the Revised Cardiac Risk Index (RCRI).  Therefore, he is at high risk for perioperative complications.   His functional capacity is good at 6.55 METs according to the Duke Activity Status Index (DASI). )  Anesthesia Quick Evaluation

## 2024-04-11 ENCOUNTER — Ambulatory Visit (HOSPITAL_COMMUNITY): Payer: Self-pay | Admitting: Physician Assistant

## 2024-04-11 ENCOUNTER — Other Ambulatory Visit: Payer: Self-pay

## 2024-04-11 ENCOUNTER — Encounter (HOSPITAL_COMMUNITY)
Admission: RE | Disposition: A | Discharge status: Home / Self Care | Payer: Self-pay | Source: Home / Self Care | Attending: General Surgery

## 2024-04-11 ENCOUNTER — Encounter (HOSPITAL_COMMUNITY): Payer: Self-pay | Admitting: General Surgery

## 2024-04-11 ENCOUNTER — Ambulatory Visit (HOSPITAL_COMMUNITY): Payer: Self-pay

## 2024-04-11 ENCOUNTER — Telehealth: Payer: Self-pay | Admitting: Internal Medicine

## 2024-04-11 ENCOUNTER — Ambulatory Visit (HOSPITAL_COMMUNITY)
Admission: RE | Admit: 2024-04-11 | Discharge: 2024-04-11 | Disposition: A | Discharge status: Home / Self Care | Attending: General Surgery | Admitting: General Surgery

## 2024-04-11 DIAGNOSIS — Z955 Presence of coronary angioplasty implant and graft: Secondary | ICD-10-CM | POA: Insufficient documentation

## 2024-04-11 DIAGNOSIS — C61 Malignant neoplasm of prostate: Secondary | ICD-10-CM | POA: Diagnosis not present

## 2024-04-11 DIAGNOSIS — I2510 Atherosclerotic heart disease of native coronary artery without angina pectoris: Secondary | ICD-10-CM

## 2024-04-11 DIAGNOSIS — I5032 Chronic diastolic (congestive) heart failure: Secondary | ICD-10-CM

## 2024-04-11 DIAGNOSIS — I110 Hypertensive heart disease with heart failure: Secondary | ICD-10-CM | POA: Diagnosis not present

## 2024-04-11 DIAGNOSIS — K8050 Calculus of bile duct without cholangitis or cholecystitis without obstruction: Secondary | ICD-10-CM | POA: Diagnosis not present

## 2024-04-11 DIAGNOSIS — K8060 Calculus of gallbladder and bile duct with cholecystitis, unspecified, without obstruction: Secondary | ICD-10-CM | POA: Diagnosis not present

## 2024-04-11 DIAGNOSIS — C7989 Secondary malignant neoplasm of other specified sites: Secondary | ICD-10-CM | POA: Diagnosis not present

## 2024-04-11 DIAGNOSIS — K811 Chronic cholecystitis: Secondary | ICD-10-CM | POA: Diagnosis not present

## 2024-04-11 DIAGNOSIS — K219 Gastro-esophageal reflux disease without esophagitis: Secondary | ICD-10-CM | POA: Insufficient documentation

## 2024-04-11 DIAGNOSIS — E039 Hypothyroidism, unspecified: Secondary | ICD-10-CM | POA: Diagnosis not present

## 2024-04-11 HISTORY — PX: CHOLECYSTECTOMY: SHX55

## 2024-04-11 LAB — PSA: Prostatic Specific Antigen: 5.6 ng/mL — ABNORMAL HIGH (ref 0.00–4.00)

## 2024-04-11 SURGERY — LAPAROSCOPIC CHOLECYSTECTOMY
Anesthesia: General | Site: Abdomen

## 2024-04-11 MED ORDER — BUPIVACAINE-EPINEPHRINE (PF) 0.25% -1:200000 IJ SOLN
INTRAMUSCULAR | Status: AC
  Filled 2024-04-11: qty 30

## 2024-04-11 MED ORDER — CEFAZOLIN SODIUM-DEXTROSE 3-4 GM/150ML-% IV SOLN
3.0000 g | INTRAVENOUS | Status: AC
  Administered 2024-04-11: 3 g via INTRAVENOUS
  Filled 2024-04-11: qty 150

## 2024-04-11 MED ORDER — PHENYLEPHRINE 80 MCG/ML (10ML) SYRINGE FOR IV PUSH (FOR BLOOD PRESSURE SUPPORT)
PREFILLED_SYRINGE | INTRAVENOUS | Status: AC
  Filled 2024-04-11: qty 10

## 2024-04-11 MED ORDER — OXYCODONE HCL 5 MG PO TABS: 5.0000 mg | ORAL_TABLET | Freq: Once | ORAL | Status: DC | PRN

## 2024-04-11 MED ORDER — CHLORHEXIDINE GLUCONATE CLOTH 2 % EX PADS: 6.0000 | MEDICATED_PAD | Freq: Once | CUTANEOUS | Status: DC

## 2024-04-11 MED ORDER — ACETAMINOPHEN 10 MG/ML IV SOLN
1000.0000 mg | Freq: Once | INTRAVENOUS | Status: DC | PRN
Start: 2024-04-11 — End: 2024-04-11

## 2024-04-11 MED ORDER — DEXAMETHASONE SOD PHOSPHATE PF 10 MG/ML IJ SOLN
INTRAMUSCULAR | Status: DC | PRN
  Administered 2024-04-11: 10 mg via INTRAVENOUS

## 2024-04-11 MED ORDER — LACTATED RINGERS IV SOLN: INTRAVENOUS | Status: DC

## 2024-04-11 MED ORDER — MIDAZOLAM HCL 5 MG/5ML IJ SOLN
INTRAMUSCULAR | Status: DC | PRN
  Administered 2024-04-11: 2 mg via INTRAVENOUS

## 2024-04-11 MED ORDER — SUGAMMADEX SODIUM 200 MG/2ML IV SOLN
INTRAVENOUS | Status: AC
Start: 2024-04-11 — End: 2024-04-11
  Filled 2024-04-11: qty 2

## 2024-04-11 MED ORDER — SUGAMMADEX SODIUM 200 MG/2ML IV SOLN
INTRAVENOUS | Status: DC | PRN
  Administered 2024-04-11: 200 mg via INTRAVENOUS

## 2024-04-11 MED ORDER — ROCURONIUM BROMIDE 10 MG/ML (PF) SYRINGE
PREFILLED_SYRINGE | INTRAVENOUS | Status: DC | PRN
  Administered 2024-04-11: 10 mg via INTRAVENOUS
  Administered 2024-04-11: 70 mg via INTRAVENOUS

## 2024-04-11 MED ORDER — MIDAZOLAM HCL 2 MG/2ML IJ SOLN
INTRAMUSCULAR | Status: AC
  Filled 2024-04-11: qty 2

## 2024-04-11 MED ORDER — PROPOFOL 10 MG/ML IV BOLUS
INTRAVENOUS | Status: AC
  Filled 2024-04-11: qty 20

## 2024-04-11 MED ORDER — CHLORHEXIDINE GLUCONATE 0.12 % MT SOLN
15.0000 mL | Freq: Once | OROMUCOSAL | Status: AC
  Administered 2024-04-11: 15 mL via OROMUCOSAL

## 2024-04-11 MED ORDER — DROPERIDOL 2.5 MG/ML IJ SOLN: 0.6250 mg | Freq: Once | INTRAMUSCULAR | Status: DC | PRN

## 2024-04-11 MED ORDER — OXYCODONE HCL 5 MG PO TABS: 5.0000 mg | ORAL_TABLET | ORAL | 0 refills | Status: AC | PRN

## 2024-04-11 MED ORDER — PROPOFOL 10 MG/ML IV BOLUS
INTRAVENOUS | Status: DC | PRN
Start: 2024-04-11 — End: 2024-04-11
  Administered 2024-04-11: 200 mg via INTRAVENOUS

## 2024-04-11 MED ORDER — 0.9 % SODIUM CHLORIDE (POUR BTL) OPTIME
TOPICAL | Status: DC | PRN
Start: 2024-04-11 — End: 2024-04-11
  Administered 2024-04-11: 1000 mL

## 2024-04-11 MED ORDER — ONDANSETRON HCL 4 MG/2ML IJ SOLN
INTRAMUSCULAR | Status: DC | PRN
Start: 2024-04-11 — End: 2024-04-11
  Administered 2024-04-11: 4 mg via INTRAVENOUS

## 2024-04-11 MED ORDER — ONDANSETRON HCL 4 MG/2ML IJ SOLN
INTRAMUSCULAR | Status: AC
Start: 2024-04-11 — End: 2024-04-11
  Filled 2024-04-11: qty 2

## 2024-04-11 MED ORDER — FENTANYL CITRATE (PF) 100 MCG/2ML IJ SOLN
INTRAMUSCULAR | Status: AC
  Filled 2024-04-11: qty 2

## 2024-04-11 MED ORDER — FENTANYL CITRATE (PF) 50 MCG/ML IJ SOSY
PREFILLED_SYRINGE | INTRAMUSCULAR | Status: AC
  Filled 2024-04-11: qty 1

## 2024-04-11 MED ORDER — PHENYLEPHRINE 80 MCG/ML (10ML) SYRINGE FOR IV PUSH (FOR BLOOD PRESSURE SUPPORT)
PREFILLED_SYRINGE | INTRAVENOUS | Status: DC | PRN
Start: 2024-04-11 — End: 2024-04-11
  Administered 2024-04-11 (×2): 160 ug via INTRAVENOUS
  Administered 2024-04-11: 80 ug via INTRAVENOUS
  Administered 2024-04-11 (×2): 160 ug via INTRAVENOUS

## 2024-04-11 MED ORDER — ENOXAPARIN SODIUM 40 MG/0.4ML IJ SOSY
40.0000 mg | PREFILLED_SYRINGE | Freq: Once | INTRAMUSCULAR | Status: AC
  Administered 2024-04-11: 40 mg via SUBCUTANEOUS
  Filled 2024-04-11: qty 0.4

## 2024-04-11 MED ORDER — OXYCODONE HCL 5 MG/5ML PO SOLN: 5.0000 mg | Freq: Once | ORAL | Status: DC | PRN

## 2024-04-11 MED ORDER — FENTANYL CITRATE (PF) 50 MCG/ML IJ SOSY
25.0000 ug | PREFILLED_SYRINGE | INTRAMUSCULAR | Status: DC | PRN
  Administered 2024-04-11 (×2): 50 ug via INTRAVENOUS

## 2024-04-11 MED ORDER — ROCURONIUM BROMIDE 10 MG/ML (PF) SYRINGE
PREFILLED_SYRINGE | INTRAVENOUS | Status: AC
  Filled 2024-04-11: qty 10

## 2024-04-11 MED ORDER — LIDOCAINE HCL (PF) 2 % IJ SOLN
INTRAMUSCULAR | Status: DC | PRN
Start: 2024-04-11 — End: 2024-04-11
  Administered 2024-04-11: 100 mg via INTRADERMAL

## 2024-04-11 MED ORDER — ORAL CARE MOUTH RINSE: 15.0000 mL | Freq: Once | OROMUCOSAL | Status: AC

## 2024-04-11 MED ORDER — INDOCYANINE GREEN 25 MG IV SOLR
1.2500 mg | Freq: Once | INTRAVENOUS | Status: AC
  Administered 2024-04-11: 1.25 mg via INTRAVENOUS
  Filled 2024-04-11: qty 10

## 2024-04-11 MED ORDER — GABAPENTIN 300 MG PO CAPS
300.0000 mg | ORAL_CAPSULE | ORAL | Status: AC
  Administered 2024-04-11: 300 mg via ORAL
  Filled 2024-04-11: qty 1

## 2024-04-11 MED ORDER — BUPIVACAINE-EPINEPHRINE 0.25% -1:200000 IJ SOLN
INTRAMUSCULAR | Status: DC | PRN
Start: 2024-04-11 — End: 2024-04-11
  Administered 2024-04-11: 30 mL

## 2024-04-11 MED ORDER — FENTANYL CITRATE (PF) 100 MCG/2ML IJ SOLN
INTRAMUSCULAR | Status: DC | PRN
  Administered 2024-04-11: 100 ug via INTRAVENOUS

## 2024-04-11 MED ORDER — ACETAMINOPHEN 500 MG PO TABS: 1000.0000 mg | ORAL_TABLET | ORAL | Status: DC

## 2024-04-11 MED ORDER — LACTATED RINGERS IR SOLN
Status: DC | PRN
  Administered 2024-04-11: 1

## 2024-04-11 MED ORDER — LIDOCAINE HCL (PF) 2 % IJ SOLN
INTRAMUSCULAR | Status: AC
Start: 2024-04-11 — End: 2024-04-11
  Filled 2024-04-11: qty 5

## 2024-04-11 MED ORDER — ACETAMINOPHEN 500 MG PO TABS
1000.0000 mg | ORAL_TABLET | Freq: Once | ORAL | Status: DC
  Filled 2024-04-11: qty 2

## 2024-04-11 SURGICAL SUPPLY — 36 items
APPLICATOR ARISTA FLEXITIP XL (MISCELLANEOUS) IMPLANT
BAG COUNTER SPONGE SURGICOUNT (BAG) ×1 IMPLANT
CHLORAPREP W/TINT 26 (MISCELLANEOUS) ×1 IMPLANT
CLIP LIGATING HEMO O LOK GREEN (MISCELLANEOUS) ×1 IMPLANT
COVER MAYO STAND XLG (MISCELLANEOUS) IMPLANT
COVER SURGICAL LIGHT HANDLE (MISCELLANEOUS) ×1 IMPLANT
DERMABOND ADVANCED .7 DNX12 (GAUZE/BANDAGES/DRESSINGS) ×1 IMPLANT
DRAPE C-ARM 42X120 X-RAY (DRAPES) IMPLANT
ELECT REM PT RETURN 15FT ADLT (MISCELLANEOUS) ×1 IMPLANT
GLOVE INDICATOR 6.5 STRL GRN (GLOVE) ×1 IMPLANT
GLOVE SURG MICRO LTX SZ6 (GLOVE) ×1 IMPLANT
GOWN STRL REUS W/ TWL LRG LVL3 (GOWN DISPOSABLE) ×1 IMPLANT
GRASPER SUT TROCAR 14GX15 (MISCELLANEOUS) ×1 IMPLANT
HEMOSTAT ARISTA ABSORB 3G PWDR (HEMOSTASIS) IMPLANT
IRRIGATION SUCT STRKRFLW 2 WTP (MISCELLANEOUS) ×1 IMPLANT
KIT BASIN OR (CUSTOM PROCEDURE TRAY) ×1 IMPLANT
KIT IMAGING PINPOINTPAQ (MISCELLANEOUS) IMPLANT
KIT TURNOVER KIT A (KITS) ×1 IMPLANT
LHOOK LAP DISP 36CM (ELECTROSURGICAL) ×1 IMPLANT
NDL INSUFFLATION 14GA 120MM (NEEDLE) ×1 IMPLANT
NEEDLE INSUFFLATION 14GA 120MM (NEEDLE) ×1 IMPLANT
NS IRRIG 1000ML POUR BTL (IV SOLUTION) ×1 IMPLANT
PENCIL BUTTON HOLSTER BLD 10FT (ELECTRODE) ×1 IMPLANT
POUCH LAPAROSCOPIC INSTRUMENT (MISCELLANEOUS) ×1 IMPLANT
SCISSORS LAP 5X35 DISP (ENDOMECHANICALS) ×1 IMPLANT
SET CHOLANGIOGRAPH MIX (MISCELLANEOUS) IMPLANT
SET TUBE SMOKE EVAC HIGH FLOW (TUBING) ×1 IMPLANT
SLEEVE Z-THREAD 5X100MM (TROCAR) ×2 IMPLANT
SUT MNCRL AB 4-0 PS2 18 (SUTURE) ×1 IMPLANT
SUT VICRYL 0 UR6 27IN ABS (SUTURE) ×1 IMPLANT
SYSTEM BAG RETRIEVAL 10MM (BASKET) ×1 IMPLANT
TOWEL GREEN STERILE FF (TOWEL DISPOSABLE) ×1 IMPLANT
TRAY LAPAROSCOPIC (CUSTOM PROCEDURE TRAY) ×1 IMPLANT
TROCAR 11X100 Z THREAD (TROCAR) IMPLANT
TROCAR Z THREAD OPTICAL 12X100 (TROCAR) ×1 IMPLANT
TROCAR Z-THREAD OPTICAL 5X100M (TROCAR) ×1 IMPLANT

## 2024-04-11 NOTE — Anesthesia Postprocedure Evaluation (Signed)
 Anesthesia Post Note  Patient: Blake Carter  Procedure(s) Performed: LAPAROSCOPIC CHOLECYSTECTOMY (Abdomen)     Patient location during evaluation: PACU Anesthesia Type: General Level of consciousness: awake and alert Pain management: pain level controlled Vital Signs Assessment: post-procedure vital signs reviewed and stable Respiratory status: spontaneous breathing, nonlabored ventilation, respiratory function stable and patient connected to nasal cannula oxygen Cardiovascular status: blood pressure returned to baseline and stable Postop Assessment: no apparent nausea or vomiting Anesthetic complications: no   No notable events documented.  Last Vitals:  Vitals:   04/11/24 0650 04/11/24 1030  BP: (!) 143/85 134/77  Pulse: 66 64  Resp: 18 (!) 28  Temp: 36.8 C 36.4 C  SpO2: 93% 97%    Last Pain:  Vitals:   04/11/24 1030  TempSrc:   PainSc: 0-No pain                 Thom JONELLE Peoples

## 2024-04-11 NOTE — Anesthesia Procedure Notes (Signed)
 Procedure Name: Intubation Date/Time: 04/11/2024 8:38 AM  Performed by: Yanni Ruberg D, CRNAPre-anesthesia Checklist: Patient identified, Emergency Drugs available, Suction available and Patient being monitored Patient Re-evaluated:Patient Re-evaluated prior to induction Oxygen Delivery Method: Circle system utilized Preoxygenation: Pre-oxygenation with 100% oxygen Induction Type: IV induction Ventilation: Mask ventilation without difficulty Laryngoscope Size: Mac and 4 Grade View: Grade I Tube type: Oral Tube size: 7.5 mm Number of attempts: 1 Airway Equipment and Method: Stylet and Oral airway Placement Confirmation: ETT inserted through vocal cords under direct vision, positive ETCO2 and breath sounds checked- equal and bilateral Secured at: 23 cm Tube secured with: Tape Dental Injury: Teeth and Oropharynx as per pre-operative assessment

## 2024-04-11 NOTE — Telephone Encounter (Signed)
 Patient overdue for follow, LOV 10/2022. Message received 11/4 to get patient scheduled for follow up/device clearance d/t 11/12 laparoscopic cholecystectomy. Was unable to reach patient after multiple attempts - 11/5, 11/7 and 11/10.

## 2024-04-11 NOTE — Transfer of Care (Signed)
 Immediate Anesthesia Transfer of Care Note  Patient: Blake Carter  Procedure(s) Performed: LAPAROSCOPIC CHOLECYSTECTOMY (Abdomen)  Patient Location: PACU  Anesthesia Type:General  Level of Consciousness: awake, alert , and oriented  Airway & Oxygen Therapy: Patient Spontanous Breathing and Patient connected to face mask oxygen  Post-op Assessment: Report given to RN and Post -op Vital signs reviewed and stable  Post vital signs: Reviewed and stable  Last Vitals:  Vitals Value Taken Time  BP    Temp    Pulse 63 04/11/24 10:28  Resp 21 04/11/24 10:28  SpO2 96 % 04/11/24 10:28  Vitals shown include unfiled device data.  Last Pain:  Vitals:   04/11/24 0704  TempSrc:   PainSc: 1          Complications: No notable events documented.

## 2024-04-11 NOTE — H&P (Signed)
 HPI  Blake Carter is an 73 y.o. male who was seen in clinic on 02/07/24 for symptomatic cholelithiasis  Patient has had pain for 2+ years intermittently in RUQ. He states it is triggered by PO intake especially fatty or greasy foods. No changes in color of urine or stools. He denies fevers/chills, nausea/emesis. Pain typically lasts for 2-3 hours before resolving. He has episodes 3-4 times per week. Several weeks ago he had to take a leftover narcotic due to the severity of the pain, but that is the only episode he has had that has been that severe.   10 point review of systems is negative except as listed above in HPI.  Objective  Past Medical History: Past Medical History:  Diagnosis Date   Allergic rhinitis    Anemia associated with cervical cancer treated with erythropoietin (HCC)    Aortic atherosclerosis    Arthritis    a little bit in some of my joints (06/20/2014)   Ascending aorta dilatation    4.1cm ascending aorta by CT 11/2023   Chronic diastolic CHF (congestive heart failure) (HCC)    Coronary artery disease    a. 05/2014 Cath/PCI: LM nl, LAD 40p, 60-70d (FFR 0.80->3.0x24 Synergy DES), D2 40, LCX 90d (2.25x12 Synergy DES), RCA 20-58m, EF 60-65%. b. Cath 02/2017 without acute change, elev LVEDP suggestive of dCHF.   Depression    Dilated aortic root    aortic root at 44mm by Chest CTA 11/2019   ED (erectile dysfunction)    Environmental allergies    dry cough   Fatty liver    GERD (gastroesophageal reflux disease)    History of hiatal hernia    Hydronephrosis of right kidney    a. s/p ureteral stenting in the past.   Hyperlipidemia    under control   Hypertension    Hypothyroidism    OCD (obsessive compulsive disorder)    Pacemaker    PAF (paroxysmal atrial fibrillation) (HCC)    Panic disorder    PAT (paroxysmal atrial tachycardia)    Pneumonia    PONV (postoperative nausea and vomiting)    after thyroid  surgery no problems in 10 years    Prostate cancer  (HCC)    prostate cancer- robotic prostatectomy - followed by radiation because of positive lymph node--and pt on lupron  injections   PVC's (premature ventricular contractions)    Sinus bradycardia    a. chronic, asymptomatic.  24 hour Holter 10/2016 showed Sinus bradycardia, sinus arrhythmia and normal sinus rhythm with average heart rate 50bpm and heart rate ranged from 32 to 100bpm.   SUI (stress urinary incontinence), male    S/P ROBOTIC PROSTATECTOMY AND RADIATION TX    Past Surgical History: Past Surgical History:  Procedure Laterality Date   COLONOSCOPY  07/29/2013   Dr. Celestia   CORONARY ANGIOPLASTY WITH STENT PLACEMENT  06/20/2014   2   CORONARY PRESSURE/FFR STUDY N/A 07/08/2020   Procedure: INTRAVASCULAR PRESSURE WIRE/FFR STUDY;  Surgeon: Claudene Victory ORN, MD;  [facility];  Service: Cardiovascular;  Laterality: N/A;   CYSTOSCOPY  11/30/2011   Procedure: CYSTOSCOPY;  Surgeon: Glendia DELENA Elizabeth, MD;  Location: [facility];  Service: Urology;  Laterality: N/A;  Inplantation of Artificial Sphincter and Cystoscopy   CYSTOSCOPY WITH RETROGRADE PYELOGRAM, URETEROSCOPY AND STENT PLACEMENT Right 02/11/2014   Procedure: CYSTOSCOPY WITH RETROGRADE PYELOGRAM, URETEROSCOPY ,URETERAL BIOPSY AND STENT PLACEMENT;  Surgeon: Gretel Ferrara, MD;  Location: [facility];  Service: Urology;  Laterality: Right;   CYSTOSCOPY  WITH RETROGRADE PYELOGRAM, URETEROSCOPY AND STENT PLACEMENT Right 04/15/2014   Procedure: CYSTOSCOPY WITH RETROGRADE PYELOGRAM, URETEROSCOPY, BIOPSY AND STENT PLACEMENT;  Surgeon: Gretel Ferrara, MD;  Location: [facility];  Service: Urology;  Laterality: Right;   LEFT HEART CATH AND CORONARY ANGIOGRAPHY N/A 03/29/2017   Procedure: LEFT HEART CATH AND CORONARY ANGIOGRAPHY;  Surgeon: Anner Alm ORN, MD;  Location: [facility] INVASIVE CV LAB;  Service: Cardiovascular;  Laterality: N/A;   LEFT HEART CATHETERIZATION WITH CORONARY ANGIOGRAM N/A 06/20/2014   Procedure: LEFT HEART  CATHETERIZATION WITH CORONARY ANGIOGRAM;  Surgeon: Alm ORN Anner, MD;  Location: [facility] CATH LAB;  Service: Cardiovascular;  Laterality: N/A;   PACEMAKER IMPLANT N/A 05/21/2019   Procedure: PACEMAKER IMPLANT;  Surgeon: Waddell Danelle ORN, MD;  [facility];  Service: Cardiovascular;  Laterality: N/A;   PERCUTANEOUS CORONARY STENT INTERVENTION (PCI-S)  06/20/2014   Procedure: PERCUTANEOUS CORONARY STENT INTERVENTION (PCI-S);  Surgeon: Alm ORN Anner, MD;  Location: [facility] CATH LAB;  Service: Cardiovascular;;  LAD and Circumflex   REFRACTIVE SURGERY Bilateral 05/31/2002   RIGHT/LEFT HEART CATH AND CORONARY ANGIOGRAPHY N/A 07/08/2020   Procedure: RIGHT/LEFT HEART CATH AND CORONARY ANGIOGRAPHY;  Surgeon: Claudene Victory ORN, MD;  [facility];  Service: Cardiovascular;  Laterality: N/A;   ROBOT ASSISTED LAPAROSCOPIC RADICAL PROSTATECTOMY  05/31/2010   THYROIDECTOMY, PARTIAL  10/30/1974   TONSILLECTOMY  05/31/1954   TOTAL THYROIDECTOMY  10/29/2012   nuked it   UPPER GI ENDOSCOPY     food impaction 2009 done by Dr Celestia   UPPER GI ENDOSCOPY     URINARY SPHINCTER IMPLANT  11/30/2011   Procedure: ARTIFICIAL URINARY SPHINCTER;  Surgeon: Glendia DELENA Elizabeth, MD;  Location: [facility];  Service: Urology;  Laterality: N/A;    Family History:  Family History  Problem Relation Age of Onset   Cancer Father    Hyperlipidemia Father    Heart disease Father        Father had CABG/aorta repair by the time he was in his 55s   CAD Brother        Brother who is 10 years younger has had some sort of stenting    Social History:  reports that he has never smoked. He has never used smokeless tobacco. He reports current alcohol use. He reports that he does not use drugs.  Allergies:  Allergies  Allergen Reactions   Cimetidine Other (See Comments)    Unknown - over 30 years ago (2021) - unsure what occurred it may have been hives or flushing Other reaction(s): flushed   Atorvastatin       Myalgias on 80mg  dose   Hydrocodone  Bit-Homatrop Mbr Other (See Comments)    Passed out - pt states he doesn't have an issue with this medication   Niacin And Related Other (See Comments)    Flushing     Medications: I have reviewed the patient's current medications.  Labs: Pertinent lab work personally reviewed.  Imaging: Pertinent imaging personally reviewed  Physical Exam Blood pressure (!) 143/85, pulse 66, temperature 98.2 F (36.8 C), temperature source Oral, resp. rate 18, height 5' 11 (1.803 m), weight 121.6 kg, SpO2 93%. General: No acute distress, well appearing HEENT: PERRL, hearing grossly normal, mucous membranes moist CV: Irregularly irregular Pulm: Normal work of breathing on room air Abd: Soft, minimal tenderness to deep palpation, nondistended, well healed remote laparoscopic surgical scars Extremities: Warm and well perfused Neuro: A&O x4, no focal neurologic deficits Psych: Appropriate mood and effect  Assessment   Blake Carter is an 73 y.o. male with biliary colic  Plan  - Plan to proceed with elective laparoscopic cholecystectomy with ICG - We discussed the etiology of patient's pain, we discussed treatment options and recommended surgery. We discussed details of surgery including general anesthesia, laparoscopic approach, identification of cystic duct and common bile duct. Ligation of cystic duct and cystic artery. Possible need for intraoperative cholangiogram, open procedure, and subtotal cholecystectomy. Possible risks of common bile duct injury, injury to surrounding structures, bile leak, bleeding, infection, diarrhea, retained stone and hernia. The patient showed good understanding and all questions were answered.    Orie Silversmith, MD General Surgery, Surgical Critical Care and Trauma

## 2024-04-11 NOTE — Op Note (Signed)
 04/11/2024 11:07 AM  PATIENT: Blake Carter  73 y.o. 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, diaphragmatic and liver implants  PROCEDURE: laparoscopic cholecystectomy with indocyanine green, peritoneal and diaphragmatic biopsies  SURGEON: Orie Silversmith, MD  ASSISTANT: Cathlyn Idler, MD  ANESTHESIA: General endotracheal  EBL: 5cc  DRAINS: None  SPECIMEN: Gallbladder, diaphragmatic implant, peritoneal implant  COUNTS: Sponge, needle and instrument counts were reported correct x2 at the conclusion of the operation  DISPOSITION: PACU in satisfactory condition  COMPLICATIONS: Concern for progressive metastatic disease  FINDINGS: Extensive RUQ peritoneal, diaphragmatic, falciform, and liver implants as well as mid anterior abdominal peritoneal implants. Biopsies sent. Critical view of safety achieved prior to clipping cystic duct, cystic artery and posterior cystic artery.  DESCRIPTION:  Preoperative indocyanine green was administered in preoperative holding. The patient was identified & brought into the operating room. He was then positioned supine on the OR table. SCDs were in place and active during the entire case. He then underwent general endotracheal anesthesia. Pressure points were padded. Hair on the abdomen was clipped by the OR team. The abdomen was prepped and draped in the standard sterile fashion. Antibiotics were administered. A surgical timeout was performed and confirmed our  plan.  A small incision was made in the LUQ at Palmer's point and a veress needle was inserted. Air was aspirated and subsequent positive drop test. Abdomen then insufflated to . A periumbilical incision was then made and a 5mm trocar optiview using a 30 degree scope was inserted and the abdomen was entered under direct visualization. Inspection confirmed no evidence of trocar or veress site complications. The veress was then removed.   The patient was then positioned in reverse Trendelenburg with slight left side down. A 12 mm supxiphoid trocar was placed under direct visualization and  two additional 5mm trocars were placed along the right subcostal line - one 5mm port in mid subcostal region, another 5mm port in the right flank near the anterior axillary line.  The liver and gallbladder were inspected, gallbladder slightly distended but no obvious inflammation.  We did not implants extensively on the right diaphragm and peritoneal lining in the right upper quadrant, as well as falciform and a few small implants on the liver. We turned to inspect the rest of the abdomen after seeing this and saw a few small implants in peritoneum of mid anterior abdominal wall. At this point we decided to take a biopsy of one of the implants and send for frozen. Given his symptoms were still consistent with biliary disease decision made to proceed with cholecystectomy. The gallbladder fundus was grasped and elevated cephalad. An additional grasper was then placed on the infundibulum of the gallbladder and the infundibulum was retracted laterally. Staying high on the gallbladder, the peritoneum on both sides of the gallbladder was opened with hook cautery. Gentle blunt dissection was then employed with a [place]  dissector working down into [employer]. The cystic duct was identified and carefully circumferentially dissected. The cystic artery was also identified and carefully circumferentially dissected. The space  between the cystic artery and hepatocystic plate was developed such  that a good view of the liver could be seen through a window medial to the cystic artery. The triangle of Calot had been cleared of all fibrofatty tissue. At this point, a critical view of safety was achieved and the only structures visualized was the skeletonized cystic duct laterally, the skeletonized cystic artery and the liver through the window medial to the artery. A posterior cystic artery was noted.  Attention turned to infrared fluorescent cholangiography with indocyanine green but given some minimal spillage of  bile when inadvertently opening peritoneum of medial gallbladder, was not especially effective in delineating anatomy.   The cystic duct and artery were clipped with 2 hemolock clips on the patient side and 1 clip on the specimen side. The posterior artery was clipped with two clips on patient side. The cystic duct and artery were then divided. The posterior artery was divided with electrocautery above the clips. The gallbladder was then freed from its remaining attachments to the liver using electrocautery and placed into an endocatch bag. The RUQ was gently irrigated with sterile saline. There was some minimal oozing after electrocautery in gallbladder fossa and given patient on Eliquis  and will need to restart in coming days, decision made to use arista, this was placed in gallbladder fossa. Hemostasis was then verified. The clips were in good position; the gallbladder fossa was dry. The rest of the abdomen was inspected no injury nor bleeding elsewhere was identified.  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 We then turned our attention to removing an additional plaque in the RUQ peritoneal lining. This was sent for permanent pathology. Hemostasis confirmed at biopsy sites.  The endocatch bag containing the gallbladder was then removed from the subxiphoid port  site and passed off as specimen. The subxiphoid port fascia was then closed in a figured of eight fashion with 0 vicryl using a suture passer. The RUQ ports were removed under direct visualization and noted to be hemostatic.SABRA The fascia was palpated and noted to be completely closed. The abdomen was then desufflated and the periumbilical trocar removed. The skin of all incision sites was approximated with 4-0 monocryl subcuticular suture and dermabond applied. The was then awakened from anesthesia, extubated, and transferred to a stretcher for transport to PACU in satisfactory condition.  Instrument, sponge, and needle counts were correct at closure and at the conclusion of the case.   Orie Silversmith, MD [facility] Surgery

## 2024-04-12 ENCOUNTER — Encounter (HOSPITAL_COMMUNITY): Payer: Self-pay | Admitting: General Surgery

## 2024-04-12 LAB — CEA: CEA: 1.8 ng/mL (ref 0.0–4.7)

## 2024-04-12 LAB — AFP TUMOR MARKER: AFP, Serum, Tumor Marker: 2.6 ng/mL (ref 0.0–8.4)

## 2024-04-12 LAB — CANCER ANTIGEN 19-9: CA 19-9: <2 U/mL (ref 0–35)

## 2024-04-16 LAB — SURGICAL PATHOLOGY

## 2024-04-17 DIAGNOSIS — C61 Malignant neoplasm of prostate: Secondary | ICD-10-CM | POA: Diagnosis not present

## 2024-04-18 ENCOUNTER — Other Ambulatory Visit (HOSPITAL_COMMUNITY): Payer: Self-pay | Admitting: Urology

## 2024-04-18 DIAGNOSIS — C61 Malignant neoplasm of prostate: Secondary | ICD-10-CM

## 2024-05-01 ENCOUNTER — Encounter (HOSPITAL_COMMUNITY)
Admission: RE | Admit: 2024-05-01 | Discharge: 2024-05-01 | Disposition: A | Source: Ambulatory Visit | Attending: Urology | Admitting: Urology

## 2024-05-01 DIAGNOSIS — C61 Malignant neoplasm of prostate: Secondary | ICD-10-CM | POA: Insufficient documentation

## 2024-05-01 DIAGNOSIS — R918 Other nonspecific abnormal finding of lung field: Secondary | ICD-10-CM | POA: Diagnosis not present

## 2024-05-01 MED ORDER — FLOTUFOLASTAT F 18 GALLIUM 296-5846 MBQ/ML IV SOLN
8.1600 | Freq: Once | INTRAVENOUS | Status: AC
  Administered 2024-05-01: 8.16 via INTRAVENOUS

## 2024-05-09 ENCOUNTER — Encounter: Payer: Self-pay | Admitting: Cardiology

## 2024-05-14 ENCOUNTER — Ambulatory Visit: Payer: Medicare PPO

## 2024-05-14 DIAGNOSIS — I480 Paroxysmal atrial fibrillation: Secondary | ICD-10-CM | POA: Diagnosis not present

## 2024-05-15 LAB — CUP PACEART REMOTE DEVICE CHECK
Date Time Interrogation Session: 20251215121123
Implantable Lead Connection Status: 753985
Implantable Lead Connection Status: 753985
Implantable Lead Implant Date: 20201221
Implantable Lead Implant Date: 20201221
Implantable Lead Location: 753859
Implantable Lead Location: 753860
Implantable Lead Model: 377169
Implantable Lead Model: 377171
Implantable Lead Serial Number: 7000011949
Implantable Lead Serial Number: 81083606
Implantable Pulse Generator Implant Date: 20201221
Pulse Gen Model: 407145
Pulse Gen Serial Number: 69703841

## 2024-05-17 ENCOUNTER — Encounter: Payer: Self-pay | Admitting: Cardiology

## 2024-05-18 NOTE — Progress Notes (Signed)
 Remote PPM Transmission

## 2024-05-21 ENCOUNTER — Other Ambulatory Visit: Payer: Self-pay

## 2024-05-21 MED ORDER — AMLODIPINE BESYLATE 10 MG PO TABS: 10.0000 mg | ORAL_TABLET | Freq: Every day | ORAL | 3 refills | Status: DC

## 2024-05-21 NOTE — Telephone Encounter (Signed)
 Okay to go back to lisinopril  instead of amlodipine  to allay patient's concerns, but it is very possible that cough could return.  Thanks MJP

## 2024-05-22 ENCOUNTER — Encounter (HOSPITAL_BASED_OUTPATIENT_CLINIC_OR_DEPARTMENT_OTHER): Payer: Self-pay | Admitting: Family

## 2024-05-22 ENCOUNTER — Ambulatory Visit (HOSPITAL_BASED_OUTPATIENT_CLINIC_OR_DEPARTMENT_OTHER): Admitting: Family

## 2024-05-22 ENCOUNTER — Other Ambulatory Visit (HOSPITAL_BASED_OUTPATIENT_CLINIC_OR_DEPARTMENT_OTHER): Payer: Self-pay

## 2024-05-22 VITALS — BP 114/70 | HR 68 | Ht 71.0 in | Wt 271.4 lb

## 2024-05-22 DIAGNOSIS — I10 Essential (primary) hypertension: Secondary | ICD-10-CM

## 2024-05-22 DIAGNOSIS — E785 Hyperlipidemia, unspecified: Secondary | ICD-10-CM | POA: Diagnosis not present

## 2024-05-22 DIAGNOSIS — I77810 Thoracic aortic ectasia: Secondary | ICD-10-CM | POA: Diagnosis not present

## 2024-05-22 DIAGNOSIS — I25118 Atherosclerotic heart disease of native coronary artery with other forms of angina pectoris: Secondary | ICD-10-CM | POA: Diagnosis not present

## 2024-05-22 MED ORDER — AMLODIPINE-OLMESARTAN 10-20 MG PO TABS
1.0000 | ORAL_TABLET | Freq: Every day | ORAL | 0 refills | Status: DC
  Filled 2024-05-22: qty 30, 30d supply, fill #0

## 2024-05-22 MED ORDER — AMLODIPINE-OLMESARTAN 10-20 MG PO TABS: 1.0000 | ORAL_TABLET | Freq: Every day | ORAL | 1 refills | Status: DC

## 2024-05-22 MED ORDER — FUROSEMIDE 20 MG PO TABS
20.0000 mg | ORAL_TABLET | Freq: Every day | ORAL | 2 refills | Status: DC | PRN
  Filled 2024-05-22: qty 30, 30d supply, fill #0

## 2024-05-22 NOTE — Patient Instructions (Signed)
 Medication Instructions:  STOP Amlodipine   STOP Lisinopril   START Amlodipine -Olmesartan  10-20mg  daily  *If you need a refill on your cardiac medications before your next appointment, please call your pharmacy*  Lab Work: Your physician recommends that you return for lab work on January 9th after you see Jodie Passey, PA at the [employer] office  If you have labs (blood work) drawn today and your tests are completely normal, you will receive your results only by: MyChart Message (if you have MyChart) OR A paper copy in the mail If you have any lab test that is abnormal or we need to change your treatment, we will call you to review the results.  Follow-Up: At [facility], you and your health needs are our priority.  As part of our continuing mission to provide you with exceptional heart care, our providers are all part of one team.  This team includes your primary Cardiologist (physician) and Advanced Practice Providers or APPs (Physician Assistants and Nurse Practitioners) who all work together to provide you with the care you need, when you need it.  Your next appointment:   As scheduled with Jodie Passey, PA in January AND In 2-3 months with Dr. Shlomo or Reche GORMAN Finder, NP   We recommend signing up for the patient portal called MyChart.  Sign up information is provided on this After Visit Summary.  MyChart is used to connect with patients for Virtual Visits (Telemedicine).  Patients are able to view lab/test results, encounter notes, upcoming appointments, etc.  Non-urgent messages can be sent to your provider as well.   To learn more about what you can do with MyChart, go to [email or web address].   Other Instructions  Tips to Measure your Blood Pressure Correctly  Here's what you can do to ensure a correct reading:  Don't drink a caffeinated beverage or smoke during the 30 minutes before the test.  Sit quietly for five minutes before the test begins.   During the measurement, sit in a chair with your feet on the floor and your arm supported so your elbow is at about heart level.  The inflatable part of the cuff should completely cover at least 80% of your upper arm, and the cuff should be placed on bare skin, not over a shirt.  Don't talk during the measurement.  Have your blood pressure measured twice, with a brief break in between. If the readings are different by 5 points or more, have it done a third time.   Blood pressure categories  Blood pressure category SYSTOLIC (upper number)  DIASTOLIC (lower number)  Normal Less than 120 mm Hg and Less than 80 mm Hg  Elevated 120-129 mm Hg and Less than 80 mm Hg  High blood pressure: Stage 1 hypertension 130-139 mm Hg or 80-89 mm Hg  High blood pressure: Stage 2 hypertension 140 mm Hg or higher or 90 mm Hg or higher  Hypertensive crisis (consult your doctor immediately) Higher than 180 mm Hg and/or Higher than 120 mm Hg  Source: American Heart Association and American Stroke Association. For more on getting your blood pressure under control, buy Controlling Your Blood Pressure, a Special Health Report from [facility].   Blood Pressure Log   Date   Time  Blood Pressure  Position  Example: Nov 1 9 AM 124/78 sitting

## 2024-05-22 NOTE — Progress Notes (Signed)
 " Cardiology Office Note   Date:  05/22/2024  ID:  Blake Carter, DOB 15-Apr-1951, MRN [id number] PCP: Okey Carlin Redbird, MD  [facility] Providers Cardiologist:  Wilbert Bihari, MD Electrophysiologist:  Danelle Birmingham, MD     History of Present Illness Blake Carter is a 73 y.o. 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 10/2023 echo aortic root 43mm, ascending aorta 44mm ? 11/2023 PET ascending aorta 41mm), PAF, atrial tachycardia, PVC, hypertension, hyperlipidemia, prostate cancer s/p robotic prostatectomy, GERD, hiatal hernia, hypothyroidism s/p RAI, OCD, HFpEF.   By pacemaker interrogation 12/2019 atrial fibrillation noted. OAC initiated.   LHC 2022 with widely patent LCx and LAD stent, normal RCA and LM with diffuse nonobstructive disease of PDA. FFR unremarkable.   Evaluated by Dr. Bihari 09/21/2023 and recommended for cardiac PET as well as echocardiogram. Echocardiogram 11/01/2023 normal LVEF 55%, no RWMA, gr1dd, RV normal size and function, trivial MR, mild AI, mild dilation aortic root 43 mm, dilation ascending aorta 44 mm.  Cardiac PET 11/2023 normal LVEF 57%, low risk study with no ischemia nor infarction.  Seen by Pharm.D. team 11/18/2023 for hyperlipidemia.  Triglycerides were 195 despite fenofibrate  145 mg daily.  He reported he has been told to avoid fish oils by urology.  No medication changes were made at that time and rosuvastatin  40 mg daily, fenofibrate  145 mg daily were continued.  He had laparoscopic cholecystectomy 04/11/24. Unfortunately metastatic prostate cancer noted on diaphragm and peritonium. Now starting Nubeqa.  Presents today for cough. Sent MyChart message noting onset nonproductive cough 07/2023 with unremarkable workup with ENT. Dr. Bihari advised stopping Lisinopril  and increasing Amlodipine  to 10mg  daily. However his BP became  elevated.  Home BP cuff is an upper arm cuff which has not been checked in a number of years. BP at home earlier today 140/85 though well controlled in clinic.   Exercising at [employer] at pool. But had to be out of the pool after November surgery, interruption with the holidays. Hopeful to resume soon. Also plans for upcoming PT for numbness in his right calf which has been told is neuropathy. Has been seen by orthopedics (Dr. Yvone) and neuropathy. No pain consistent with claudication.   ROS: Please see the history of present illness.    All other systems reviewed and are negative.   Studies Reviewed      Cardiac Studies & Procedures   ______________________________________________________________________________________________ CARDIAC CATHETERIZATION  CARDIAC CATHETERIZATION 03/29/2017  Conclusion Images from the original result were not included.   Mid LAD Synergy DES & dCx-LPL(OM2) Synergy DES from 07/2014, 0 %stenosed.  LV end diastolic pressure is moderately elevated.  Widely patent stents, with otherwise mild disease.  No culprit lesion to explain exertional chest pain. He just had an echocardiogram done with normal LV function.  LVEDP is mild to moderately elevated.  Plan: Defer to Dr. Bihari for ongoing management.  Consider treating diastolic heart failure. Continue aggressive Risk factor medication.  He should be able to discharge home today after bedrest, from a heart cath standpoint   Alm Clay, MD Alm Clay, M.D., M.S. Interventional Cardiologist  Pager # [phone] Phone # [phone] [address] [place], [place] [address]  Findings Coronary Findings Diagnostic  Dominance: Co-dominant  Left Main Vessel was injected. Vessel is large. Vessel is angiographically normal.  Left Anterior Descending Vessel was injected. Vessel is normal in caliber. Vessel is  angiographically normal. Previously placed Mid LAD drug eluting  stent is widely patent. The lesion is smooth.  First Diagonal Branch Vessel was injected. Vessel is small in size. Vessel is angiographically normal.  First Septal Branch Vessel was injected. Vessel is small in size. Vessel is angiographically normal.  Second Diagonal Branch Vessel was injected. Vessel is small in size. Vessel is angiographically normal.  Second Septal Branch Vessel was injected. Vessel is small in size. Vessel is angiographically normal.  Third Diagonal Branch Vessel was injected. Vessel is small in size. Vessel is angiographically normal.  Third Septal Branch Vessel was injected. Vessel is small in size. Vessel is angiographically normal.  Ramus Intermedius Vessel was injected. Vessel is small. Vessel is angiographically normal.  Left Circumflex Vessel was injected. Vessel is normal in caliber. Vessel is angiographically normal.  First Obtuse Marginal Branch Vessel was injected. Vessel is small in size. Vessel is angiographically normal.  Lateral Second Obtuse Marginal Branch Vessel was injected. Vessel is moderate in size. Vessel is angiographically normal.  Third Obtuse Marginal Branch Vessel was injected. Vessel is moderate in size. Vessel is angiographically normal. Courses as Left Posterolateral Previously placed Ost 3rd Mrg drug eluting stent is widely patent. The lesion is smooth.  Lateral Third Obtuse Marginal Branch Vessel was injected. Vessel is small in size. Vessel is angiographically normal.  Right Coronary Artery Vessel was injected. Vessel is large. Vessel is angiographically normal. The lesion is tubular and smooth. The lesion is tubular, concentric and smooth.  Acute Marginal Branch Vessel was injected. Vessel is small in size. Vessel is angiographically normal.  Right Posterior Descending Artery Vessel is moderate in size.  Inferior Septal Vessel was injected. Vessel is small in size. Vessel is angiographically normal.  Right  Posterior Atrioventricular Artery Vessel was injected. Vessel is small in size. Vessel is angiographically normal.  Intervention  No interventions have been documented.   CARDIAC CATHETERIZATION  CARDIAC CATHETERIZATION 07/08/2020  Conclusion  Widely patent LAD and circumflex stents.  Widely patent left main  Widely patent circumflex.  The left anterior descending contains 30% stenosis proximal to the mid stent.  Hemodynamic assessment was performed with the Pressure Wire X demonstrating an RFR of 0.9 and an FFR of 0.82.  A full hemodynamic assessment to exclude microvascular dysfunction was performed.  GFR was 3.6 and INR was 12.0.  Right coronary is widely patent with luminal irregularities.  There is diffuse disease in the PDA but without focal high-grade obstruction.  Left ventricular size is normal.  EF is 55%.  EDP is normal.  Right heart pressures are normal.  RECOMMENDATIONS:   No evidence of significant epicardial disease or microvascular dysfunction.  Continue aggressive risk factor modification.  Initiate an exercise program and weight loss.  Further management per treatment team.  Findings Coronary Findings Diagnostic  Dominance: Right  Left Main Vessel is large. Vessel is angiographically normal.  Left Anterior Descending Vessel is normal in caliber. The vessel exhibits minimal luminal irregularities. Prox LAD to Mid LAD lesion is 30% stenosed. Mid LAD lesion is 25% stenosed. The lesion is smooth. The lesion was previously treated using a drug eluting stent over 2 years ago. Synergy DES 3.0 mm x 24 mm - 3.5 mm  First Diagonal Branch Vessel is small in size. Vessel is angiographically normal.  First Septal Branch Vessel is small in size. Vessel is angiographically normal.  Second Diagonal Branch Vessel is small in size. Vessel is angiographically normal.  Second Septal Branch Vessel is small in  size. Vessel is angiographically normal.  Third  Diagonal Branch Vessel is small in size. Vessel is angiographically normal.  Third Septal Branch Vessel is small in size. Vessel is angiographically normal.  Ramus Intermedius Vessel is small. Vessel is angiographically normal.  Left Circumflex Vessel is normal in caliber and small. The vessel exhibits minimal luminal irregularities. Mid Cx lesion is 35% stenosed. Mid Cx to Dist Cx lesion is 25% stenosed. The lesion was previously treated.  First Obtuse Marginal Branch Vessel is small in size. Vessel is angiographically normal.  Lateral Second Obtuse Marginal Branch Vessel is moderate in size. Vessel is angiographically normal.  Third Obtuse Marginal Branch Vessel is large in size. Vessel is angiographically normal. Courses as Left Posterolateral Non-stenotic Ost 3rd Mrg lesion was previously treated. The lesion is smooth. Synergy DES 2.25 mm x 12 mm - 2.5 mm  Lateral Third Obtuse Marginal Branch Vessel is small in size. Vessel is angiographically normal.  Left Posterior Atrioventricular Artery  Right Coronary Artery Vessel is large. The vessel exhibits minimal luminal irregularities. Prox RCA lesion is 10% stenosed. The lesion is tubular and smooth. Mid RCA lesion is 20% stenosed. The lesion is tubular, concentric and smooth.  Acute Marginal Branch Vessel is small in size. Vessel is angiographically normal.  Right Posterior Descending Artery Vessel is moderate in size. There is moderate disease in the vessel.  Inferior Septal Vessel is small in size. Vessel is angiographically normal.  Right Posterior Atrioventricular Artery Vessel is small in size. Vessel is angiographically normal.  Intervention  No interventions have been documented.   STRESS TESTS  NM PET CT CARDIAC PERFUSION MULTI W/ABSOLUTE BLOODFLOW 12/21/2023  Narrative   LV perfusion is normal. There is no evidence of ischemia. There is no evidence of infarction.   Rest left ventricular function is  normal. Rest EF: 57%. Stress left ventricular function is normal. Stress EF: 63%. End diastolic cavity size is mildly enlarged. End systolic cavity size is mildly enlarged.   Myocardial blood flow was computed to be 0.18ml/g/min at rest and 2.27ml/g/min at stress. Global myocardial blood flow reserve was 2.36 and was normal.   Coronary calcium  assessment not performed due to prior revascularization. Prior stents to LAD and LCX   The study is normal. The study is low risk.  CLINICAL DATA:  This over-read does not include interpretation of cardiac or coronary anatomy or pathology. The Cardiac PET CT interpretation by the cardiologist is attached.  COMPARISON:  Chest CT of 01/09/2023  FINDINGS: No pleural fluid.  Clear imaged lungs.  Mild ascending aortic dilatation, 4.1 cm on 01/03.  Similar.  No imaged thoracic adenopathy.  Tiny hiatal hernia.  Marked hepatic steatosis  No acute osseous abnormality.  IMPRESSION: Mild ascending aortic dilatation, similar to 01/09/2023. Per consensus criteria, this warrants follow-up with CTA chest at 1 year.  Hepatic steatosis  Tiny hiatal hernia.  These results will be called to the ordering clinician or representative by the Radiologist Assistant, and communication documented in the PACS or [employer].   Electronically Signed By: Rockey Kilts M.D. On: 12/21/2023 08:32   ECHOCARDIOGRAM  ECHOCARDIOGRAM COMPLETE 11/01/2023  Narrative ECHOCARDIOGRAM REPORT    Patient Name:   NECHEMIA CHIAPPETTA Date of Exam: 11/01/2023 Medical Rec #:  [id number]        Height:       71.0 in Accession #:    [id number]       Weight:       258.0 lb Date of Birth:  07-02-1950  BSA:          2.350 m Patient Age:    72 years         BP:           126/80 mmHg Patient Gender: M                HR:           73 bpm. Exam Location:  [address]  Procedure: 2D Echo, Cardiac Doppler and Color Doppler (Both Spectral and Color Flow Doppler were utilized  during procedure).  Indications:    R06.02 SOB  History:        Patient has prior history of Echocardiogram examinations, most recent 12/11/2019. CHF, CAD, Pacemaker, Dilated aortic root, Arrythmias:Paroxysmal atrial fibrillation, Signs/Symptoms:Shortness of Breath; Risk Factors:Hypertension, Dyslipidemia and Obesity.  Sonographer:    Elsie Bohr RDCS Referring Phys: [phone] TRACI R TURNER   Sonographer Comments: No subcostal window and Technically difficult study due to poor echo windows. IMPRESSIONS   1. Left ventricular ejection fraction, by estimation, is 55%. The left ventricle has normal function. The left ventricle has no regional wall motion abnormalities. Left ventricular diastolic parameters are consistent with Grade I diastolic dysfunction (impaired relaxation). 2. Peak RV-RA gradient 21 mmHg. Right ventricular systolic function is normal. The right ventricular size is normal. 3. The mitral valve is normal in structure. Trivial mitral valve regurgitation. No evidence of mitral stenosis. 4. The aortic valve is tricuspid. Aortic valve regurgitation is mild. No aortic stenosis is present. 5. Aortic dilatation noted. There is mild dilatation of the aortic root, measuring 43 mm. There is severe dilatation of the ascending aorta, measuring 44 mm. 6. The IVC was not visualized.  FINDINGS Left Ventricle: Left ventricular ejection fraction, by estimation, is 55%. The left ventricle has normal function. The left ventricle has no regional wall motion abnormalities. The left ventricular internal cavity size was normal in size. There is no left ventricular hypertrophy. Left ventricular diastolic parameters are consistent with Grade I diastolic dysfunction (impaired relaxation).  Right Ventricle: Peak RV-RA gradient 21 mmHg. The right ventricular size is normal. No increase in right ventricular wall thickness. Right ventricular systolic function is normal.  Left Atrium: Left atrial size  was normal in size.  Right Atrium: Right atrial size was normal in size.  Pericardium: There is no evidence of pericardial effusion.  Mitral Valve: The mitral valve is normal in structure. Trivial mitral valve regurgitation. No evidence of mitral valve stenosis.  Tricuspid Valve: The tricuspid valve is normal in structure. Tricuspid valve regurgitation is trivial.  Aortic Valve: The aortic valve is tricuspid. Aortic valve regurgitation is mild. Aortic regurgitation PHT measures 711 msec. No aortic stenosis is present.  Pulmonic Valve: The pulmonic valve was normal in structure. Pulmonic valve regurgitation is not visualized.  Aorta: Aortic dilatation noted. There is mild dilatation of the aortic root, measuring 43 mm. There is severe dilatation of the ascending aorta, measuring 44 mm.  Venous: The IVC was not visualized.  IAS/Shunts: No atrial level shunt detected by color flow Doppler.   LEFT VENTRICLE PLAX 2D LVIDd:         4.60 cm   Diastology LVIDs:         3.00 cm   LV e' medial:    5.22 cm/s LV PW:         1.20 cm   LV E/e' medial:  9.3 LV IVS:        1.50 cm   LV e'  lateral:   6.20 cm/s LVOT diam:     2.30 cm   LV E/e' lateral: 7.9 LV SV:         82 LV SV Index:   35 LVOT Area:     4.15 cm   RIGHT VENTRICLE RV S prime:     12.50 cm/s TAPSE (M-mode): 1.0 cm RVSP:           24.3 mmHg  LEFT ATRIUM             Index        RIGHT ATRIUM           Index LA Vol (A2C):   38.7 ml 16.47 ml/m  RA Pressure: 3.00 mmHg LA Vol (A4C):   46.7 ml 19.88 ml/m  RA Area:     10.70 cm LA Biplane Vol: 45.2 ml 19.24 ml/m  RA Volume:   20.10 ml  8.55 ml/m AORTIC VALVE LVOT Vmax:   99.80 cm/s LVOT Vmean:  65.900 cm/s LVOT VTI:    0.198 m AI PHT:      711 msec  AORTA Ao Root diam: 4.30 cm Ao Asc diam:  4.40 cm  MITRAL VALVE               TRICUSPID VALVE MV Area (PHT): 2.50 cm    TR Peak grad:   21.3 mmHg MV Decel Time: 304 msec    TR Vmax:        231.00 cm/s MV E velocity:  48.70 cm/s  Estimated RAP:  3.00 mmHg MV A velocity: 80.20 cm/s  RVSP:           24.3 mmHg MV E/A ratio:  0.61 SHUNTS Systemic VTI:  0.20 m Systemic Diam: 2.30 cm  Dalton McleanMD Electronically signed by Ezra Kanner Signature Date/Time: 11/01/2023/3:21:25 PM    Final    MONITORS  LONG TERM MONITOR-LIVE TELEMETRY (3-14 DAYS) 02/22/2020  Narrative  Normal sinus rhythm and sinus tachycardia. The average heart rate was 67bpm. and ranged from 60-123bpm.  Nonsustained atrial tachycardia up to 16 beats in a row  Intermittent Ventricular pacing  Occsaional PVCs, bigeminal PVCs, trigeminal PVCs and ventricular couplets and triplets.  Wide complex tachycardia up to 5 beats.       ______________________________________________________________________________________________      Risk Assessment/Calculations  CHA2DS2-VASc Score = 4   This indicates a 4.8% annual risk of stroke. The patient's score is based upon: CHF History: 1 HTN History: 1 Diabetes History: 0 Stroke History: 0 Vascular Disease History: 1 Age Score: 1 Gender Score: 0            Physical Exam VS:  BP 114/70   Pulse 68   Ht 5' 11 (1.803 m)   Wt 271 lb 6.4 oz (123.1 kg)   SpO2 98%   BMI 37.85 kg/m        Wt Readings from Last 3 Encounters:  05/22/24 271 lb 6.4 oz (123.1 kg)  04/11/24 268 lb 1.3 oz (121.6 kg)  04/04/24 268 lb (121.6 kg)    GEN: Well nourished, well developed in no acute distress NECK: No JVD; No carotid bruits CARDIAC: RRR, no murmurs, rubs, gallops RESPIRATORY:  Clear to auscultation without rales, wheezing or rhonchi  ABDOMEN: Soft, non-tender, non-distended EXTREMITIES:  No edema; No deformity   ASSESSMENT AND PLAN  HTN - BP goal <130/80. At goal in clinic, not at goal at home. Plans to purchase new BP cuff. Developed cough with Lisinopril . Stop Lisinopril , stop Amlodipine . Rx  Amlodipine -Olmesartan  10-20mg  daily with BMP in 2 weeks. He will have this on 06/08/24 as he  will be at [facility] office. Discussed to monitor BP at home at least 2 hours after medications and sitting for 5-10 minutes.  CAD / HLD, LDL goal <70 / Hypertriglyceridemia - Prior DES-LCx and LAD. cPET 11/2023 low risk with no ischemia nor infarction, normal LVEF.  GDMT Toprol  100mg  daily, Rosuvastatin  40mg  daily, Fenofibrate  145mg  daily.  Urology previously recommended avoidance of fish oil based-products.  Recommend aiming for 150 minutes of moderate intensity activity per week and following a heart healthy diet.    Aortic root dilation and ascending aorta dilation -  (11/2019 CTA 4.4 cm ? 10/2023 echo aortic root 43mm, ascending aorta 44mm ? 11/2023 PET ascending aorta 41mm), PAF / Hypercoagulable state - no recurrent afib by remote device check 05/14/24. CHA2DS2-VASc Score = 4 [CHF History: 1, HTN History: 1, Diabetes History: 0, Stroke History: 0, Vascular Disease History: 1, Age Score: 1, Gender Score: 0].  Therefore, the patient's annual risk of stroke is 4.8 %.    Continue Eliquis  5mg  BID. Does not meet dose reduction criteria.   S/p PPM - upcoming in office PPM check 05/2024.        Dispo: follow up in 2 - 3 months with Dr. Shlomo or APP  Signed, Reche GORMAN Finder, NP   "

## 2024-05-27 ENCOUNTER — Ambulatory Visit: Payer: Self-pay | Admitting: Internal Medicine

## 2024-06-04 ENCOUNTER — Encounter (HOSPITAL_BASED_OUTPATIENT_CLINIC_OR_DEPARTMENT_OTHER): Payer: Self-pay

## 2024-06-05 NOTE — Telephone Encounter (Signed)
 Per note from [place] on 05/22/24:  HTN - BP goal <130/80. At goal in clinic, not at goal at home. Plans to purchase new BP cuff. Developed cough with Lisinopril . Stop Lisinopril , stop Amlodipine . Rx Amlodipine -Olmesartan  10-20mg  daily with BMP in 2 weeks. He will have this on 06/08/24 as he will be at [facility] office. Discussed to monitor BP at home at least 2 hours after medications and sitting for 5-10 minutes.

## 2024-06-07 NOTE — Progress Notes (Signed)
" °  Electrophysiology Office Note:   ID:  Blake Carter, Blake Carter 01/03/1951, MRN [id number]  Primary Cardiologist: Wilbert Bihari, MD Electrophysiologist: Eulas FORBES Furbish, MD      History of Present Illness:   Blake Carter is a 74 y.o. male with h/o SND s/p PPM, HTN, Paroxysmal AF, and aortic root dilation seen today for routine electrophysiology followup.   Since last being seen in our clinic the patient reports doing about the same. He remains concerned over his BP. Holds himself to Dr. Robie prior goal of <120/80. Home BP attached to [employer], at times running in 140s. Here, 3 manual checks and his home cuff all were <120/80. He complains of fatigue and chronic dry cough, cough somewhat better off lisinopril . Activity limited by arthritis and neuropathy in his legs. Trying to get into pool therapy.   Review of systems complete and found to be negative unless listed in HPI.   EP Information / Studies Reviewed:    EKG is ordered today. Personal review as below.  EKG Interpretation Date/Time:  Friday June 08 2024 09:37:33 EST Ventricular Rate:  70 PR Interval:  198 QRS Duration:  86 QT Interval:  396 QTC Calculation: 427 R Axis:   -19  Text Interpretation: Atrial-paced rhythm Low voltage QRS Confirmed by Lesia Heck ([id number]) on 06/08/2024 9:39:44 AM    PPM Interrogation-  reviewed in detail today,  See PACEART report.  Arrhythmia/Device History BIOTRONIK PPM HM   Physical Exam:   VS:  BP 116/68   Pulse 70   Ht 5' 11 (1.803 m)   Wt 271 lb (122.9 kg)   SpO2 97%   BMI 37.80 kg/m    Wt Readings from Last 3 Encounters:  06/08/24 271 lb (122.9 kg)  05/22/24 271 lb 6.4 oz (123.1 kg)  04/11/24 268 lb 1.3 oz (121.6 kg)     GEN: No acute distress  NECK: No JVD; No carotid bruits CARDIAC: Regular rate and rhythm, no murmurs, rubs, gallops RESPIRATORY:  Clear to auscultation without rales, wheezing or rhonchi  ABDOMEN: Soft, non-tender, non-distended EXTREMITIES:  No edema;  No deformity   ASSESSMENT AND PLAN:    SND s/p Biotronik PPM  Normal PPM function See Pace Art report No changes today  Paroxysmal AF Secondary hypercoagulable state EKG today shows NSR 0% burden by device Continue eliquis  5 mg BID for CHA2DS2/VASc of at least 5  HTN Adjustment of meds on-going by Gen Cards.  Goal BP <130/80 BMET ordered for today. BP was <120/80 on 3 manual in office checks AND his home automated cuff.    CAD No s/s of ischemia.       Disposition:   Follow up with EP Team in 12 months  Signed, Ozell Prentice Lesia, PA-C  "

## 2024-06-08 ENCOUNTER — Encounter: Payer: Self-pay | Admitting: Student

## 2024-06-08 ENCOUNTER — Ambulatory Visit: Attending: Student | Admitting: Student

## 2024-06-08 VITALS — BP 116/68 | HR 70 | Ht 71.0 in | Wt 271.0 lb

## 2024-06-08 DIAGNOSIS — I10 Essential (primary) hypertension: Secondary | ICD-10-CM

## 2024-06-08 DIAGNOSIS — I480 Paroxysmal atrial fibrillation: Secondary | ICD-10-CM

## 2024-06-08 DIAGNOSIS — I25118 Atherosclerotic heart disease of native coronary artery with other forms of angina pectoris: Secondary | ICD-10-CM | POA: Diagnosis not present

## 2024-06-08 DIAGNOSIS — Z950 Presence of cardiac pacemaker: Secondary | ICD-10-CM

## 2024-06-08 DIAGNOSIS — I495 Sick sinus syndrome: Secondary | ICD-10-CM | POA: Diagnosis not present

## 2024-06-08 LAB — CUP PACEART INCLINIC DEVICE CHECK
Battery Remaining Percentage: 60 %
Brady Statistic RA Percent Paced: 100 %
Brady Statistic RV Percent Paced: 0 %
Date Time Interrogation Session: 20260109094722
Implantable Lead Connection Status: 753985
Implantable Lead Connection Status: 753985
Implantable Lead Implant Date: 20201221
Implantable Lead Implant Date: 20201221
Implantable Lead Location: 753859
Implantable Lead Location: 753860
Implantable Lead Model: 377169
Implantable Lead Model: 377171
Implantable Lead Serial Number: 7000011949
Implantable Lead Serial Number: 81083606
Implantable Pulse Generator Implant Date: 20201221
Lead Channel Impedance Value: 507 Ohm
Lead Channel Impedance Value: 585 Ohm
Lead Channel Pacing Threshold Amplitude: 1 V
Lead Channel Pacing Threshold Amplitude: 1.1 V
Lead Channel Pacing Threshold Pulse Width: 0.4 ms
Lead Channel Pacing Threshold Pulse Width: 0.4 ms
Lead Channel Setting Pacing Amplitude: 1.9 V
Lead Channel Setting Pacing Amplitude: 3 V
Lead Channel Setting Pacing Pulse Width: 0.4 ms
Lead Channel Setting Sensing Sensitivity: 1.5 mV
Pulse Gen Model: 407145
Pulse Gen Serial Number: 69703841

## 2024-06-08 LAB — BASIC METABOLIC PANEL WITH GFR
BUN/Creatinine Ratio: 21 (ref 10–24)
BUN: 24 mg/dL (ref 8–27)
CO2: 21 mmol/L (ref 20–29)
Calcium: 9.4 mg/dL (ref 8.6–10.2)
Chloride: 102 mmol/L (ref 96–106)
Creatinine, Ser: 1.15 mg/dL (ref 0.76–1.27)
Glucose: 138 mg/dL — ABNORMAL HIGH (ref 70–99)
Potassium: 3.9 mmol/L (ref 3.5–5.2)
Sodium: 140 mmol/L (ref 134–144)
eGFR: 68 mL/min/1.73

## 2024-06-08 NOTE — Patient Instructions (Addendum)
 Medication Instructions:  No medication changes today. *If you need a refill on your cardiac medications before your next appointment, please call your pharmacy*  Lab Work: BMET scheduled today.  If you have labs (blood work) drawn today and your tests are completely normal, you will receive your results only by: MyChart Message (if you have MyChart) OR A paper copy in the mail If you have any lab test that is abnormal or we need to change your treatment, we will call you to review the results.  Testing/Procedures: No testing ordered today  Follow-Up: At [facility], you and your health needs are our priority.  As part of our continuing mission to provide you with exceptional heart care, our providers are all part of one team.  This team includes your primary Cardiologist (physician) and Advanced Practice Providers or APPs (Physician Assistants and Nurse Practitioners) who all work together to provide you with the care you need, when you need it.  Your next appointment:   12 month(s)  Provider:   You may see Eulas FORBES Furbish, MD or one of the following Advanced Practice Providers on your designated Care Team:   Charlies Arthur, PA-C Denita Lun Andy Jahmir Salo, PA-C Suzann Riddle, NP Daphne Barrack, NP    We recommend signing up for the patient portal called MyChart.  Sign up information is provided on this After Visit Summary.  MyChart is used to connect with patients for Virtual Visits (Telemedicine).  Patients are able to view lab/test results, encounter notes, upcoming appointments, etc.  Non-urgent messages can be sent to your provider as well.   To learn more about what you can do with MyChart, go to [email or web address].

## 2024-06-09 ENCOUNTER — Ambulatory Visit: Payer: Self-pay | Admitting: Cardiovascular Disease

## 2024-06-10 ENCOUNTER — Ambulatory Visit (HOSPITAL_BASED_OUTPATIENT_CLINIC_OR_DEPARTMENT_OTHER): Payer: Self-pay | Admitting: Family

## 2024-06-11 ENCOUNTER — Other Ambulatory Visit: Payer: Self-pay

## 2024-06-11 ENCOUNTER — Other Ambulatory Visit (HOSPITAL_BASED_OUTPATIENT_CLINIC_OR_DEPARTMENT_OTHER): Payer: Self-pay | Admitting: *Deleted

## 2024-06-11 DIAGNOSIS — I10 Essential (primary) hypertension: Secondary | ICD-10-CM

## 2024-06-11 DIAGNOSIS — I25118 Atherosclerotic heart disease of native coronary artery with other forms of angina pectoris: Secondary | ICD-10-CM

## 2024-06-11 MED ORDER — AMLODIPINE-OLMESARTAN 10-40 MG PO TABS: 1.0000 | ORAL_TABLET | Freq: Every day | ORAL | 3 refills | Status: AC

## 2024-06-13 ENCOUNTER — Ambulatory Visit (HOSPITAL_BASED_OUTPATIENT_CLINIC_OR_DEPARTMENT_OTHER): Attending: Physical Medicine and Rehabilitation | Admitting: Physical Therapy

## 2024-06-13 ENCOUNTER — Other Ambulatory Visit: Payer: Self-pay

## 2024-06-13 ENCOUNTER — Encounter (HOSPITAL_BASED_OUTPATIENT_CLINIC_OR_DEPARTMENT_OTHER): Payer: Self-pay | Admitting: Physical Therapy

## 2024-06-13 DIAGNOSIS — M5416 Radiculopathy, lumbar region: Secondary | ICD-10-CM | POA: Diagnosis present

## 2024-06-13 DIAGNOSIS — R2689 Other abnormalities of gait and mobility: Secondary | ICD-10-CM | POA: Diagnosis not present

## 2024-06-13 DIAGNOSIS — R29898 Other symptoms and signs involving the musculoskeletal system: Secondary | ICD-10-CM | POA: Diagnosis not present

## 2024-06-13 DIAGNOSIS — M5459 Other low back pain: Secondary | ICD-10-CM | POA: Diagnosis not present

## 2024-06-13 DIAGNOSIS — M6281 Muscle weakness (generalized): Secondary | ICD-10-CM | POA: Diagnosis not present

## 2024-06-13 NOTE — Therapy (Signed)
 " OUTPATIENT PHYSICAL THERAPY THORACOLUMBAR EVALUATION   Patient Name: Blake Carter MRN: [id number] DOB:Sep 29, 1950, 74 y.o., male Today's Date: 06/13/2024  END OF SESSION:  PT End of Session - 06/13/24 1149     Visit Number 1    Number of Visits 24    Date for Recertification  09/05/24    Authorization Type [employer]IC[employer]    Progress Note Due on Visit 10    PT Start Time 1150    PT Stop Time 1240    PT Time Calculation (min) 50 min    Activity Tolerance Patient tolerated treatment well    Behavior During Therapy WFL for tasks assessed/performed          Past Medical History:  Diagnosis Date   Allergic rhinitis    Anemia associated with cervical cancer treated with erythropoietin (HCC)    Aortic atherosclerosis    Arthritis    a little bit in some of my joints (06/20/2014)   Ascending aorta dilatation    4.1cm ascending aorta by CT 11/2023   Chronic diastolic CHF (congestive heart failure) (HCC)    Coronary artery disease    a. 05/2014 Cath/PCI: LM nl, LAD 40p, 60-70d (FFR 0.80->3.0x24 Synergy DES), D2 40, LCX 90d (2.25x12 Synergy DES), RCA 20-48m, EF 60-65%. b. Cath 02/2017 without acute change, elev LVEDP suggestive of dCHF.   Depression    Dilated aortic root    aortic root at 44mm by Chest CTA 11/2019   ED (erectile dysfunction)    Environmental allergies    dry cough   Fatty liver    GERD (gastroesophageal reflux disease)    History of hiatal hernia    Hydronephrosis of right kidney    a. s/p ureteral stenting in the past.   Hyperlipidemia    under control   Hypertension    Hypothyroidism    OCD (obsessive compulsive disorder)    Pacemaker    PAF (paroxysmal atrial fibrillation) (HCC)    Panic disorder    PAT (paroxysmal atrial tachycardia)    Pneumonia    PONV (postoperative nausea and vomiting)    after thyroid  surgery no problems in 10 years    Prostate cancer (HCC)    prostate cancer- robotic prostatectomy - followed by radiation because of  positive lymph node--and pt on lupron  injections   PVC's (premature ventricular contractions)    Sinus bradycardia    a. chronic, asymptomatic.  24 hour Holter 10/2016 showed Sinus bradycardia, sinus arrhythmia and normal sinus rhythm with average heart rate 50bpm and heart rate ranged from 32 to 100bpm.   SUI (stress urinary incontinence), male    S/P ROBOTIC PROSTATECTOMY AND RADIATION TX   Past Surgical History:  Procedure Laterality Date   CHOLECYSTECTOMY N/A 04/11/2024   Procedure: LAPAROSCOPIC CHOLECYSTECTOMY;  Surgeon: Ann Fine, MD;  Location: [facility];  Service: General;  Laterality: N/A;   COLONOSCOPY  07/29/2013   Dr. Celestia   CORONARY ANGIOPLASTY WITH STENT PLACEMENT  06/20/2014   2   CORONARY PRESSURE/FFR STUDY N/A 07/08/2020   Procedure: INTRAVASCULAR PRESSURE WIRE/FFR STUDY;  Surgeon: Claudene Victory ORN, MD;  Location: [facility];  Service: Cardiovascular;  Laterality: N/A;   CYSTOSCOPY  11/30/2011   Procedure: CYSTOSCOPY;  Surgeon: Glendia DELENA Elizabeth, MD;  Location: [facility];  Service: Urology;  Laterality: N/A;  Inplantation of Artificial Sphincter and Cystoscopy   CYSTOSCOPY WITH RETROGRADE PYELOGRAM, URETEROSCOPY AND STENT PLACEMENT Right 02/11/2014   Procedure: CYSTOSCOPY WITH RETROGRADE PYELOGRAM, URETEROSCOPY ,URETERAL BIOPSY AND STENT  PLACEMENT;  Surgeon: Gretel Ferrara, MD;  Location: [facility];  Service: Urology;  Laterality: Right;   CYSTOSCOPY WITH RETROGRADE PYELOGRAM, URETEROSCOPY AND STENT PLACEMENT Right 04/15/2014   Procedure: CYSTOSCOPY WITH RETROGRADE PYELOGRAM, URETEROSCOPY, BIOPSY AND STENT PLACEMENT;  Surgeon: Gretel Ferrara, MD;  Location: [facility];  Service: Urology;  Laterality: Right;   LEFT HEART CATH AND CORONARY ANGIOGRAPHY N/A 03/29/2017   Procedure: LEFT HEART CATH AND CORONARY ANGIOGRAPHY;  Surgeon: Anner Alm ORN, MD;  [facility];  Service: Cardiovascular;  Laterality: N/A;   LEFT HEART CATHETERIZATION WITH CORONARY  ANGIOGRAM N/A 06/20/2014   Procedure: LEFT HEART CATHETERIZATION WITH CORONARY ANGIOGRAM;  Surgeon: Alm ORN Anner, MD;  Location: [facility] CATH LAB;  Service: Cardiovascular;  Laterality: N/A;   PACEMAKER IMPLANT N/A 05/21/2019   Procedure: PACEMAKER IMPLANT;  Surgeon: Waddell Danelle ORN, MD;  Location: [facility];  Service: Cardiovascular;  Laterality: N/A;   PERCUTANEOUS CORONARY STENT INTERVENTION (PCI-S)  06/20/2014   Procedure: PERCUTANEOUS CORONARY STENT INTERVENTION (PCI-S);  Surgeon: Alm ORN Anner, MD;  Location: [facility] CATH LAB;  Service: Cardiovascular;;  LAD and Circumflex   REFRACTIVE SURGERY Bilateral 05/31/2002   RIGHT/LEFT HEART CATH AND CORONARY ANGIOGRAPHY N/A 07/08/2020   Procedure: RIGHT/LEFT HEART CATH AND CORONARY ANGIOGRAPHY;  Surgeon: Claudene Victory ORN, MD;  Location: [facility];  Service: Cardiovascular;  Laterality: N/A;   ROBOT ASSISTED LAPAROSCOPIC RADICAL PROSTATECTOMY  05/31/2010   THYROIDECTOMY, PARTIAL  10/30/1974   TONSILLECTOMY  05/31/1954   TOTAL THYROIDECTOMY  10/29/2012   nuked it   UPPER GI ENDOSCOPY     food impaction 2009 done by Dr Celestia   UPPER GI ENDOSCOPY     URINARY SPHINCTER IMPLANT  11/30/2011   Procedure: ARTIFICIAL URINARY SPHINCTER;  Surgeon: Glendia DELENA Elizabeth, MD;  Location: [facility];  Service: Urology;  Laterality: N/A;   Patient Active Problem List   Diagnosis Date Noted   Obsessive-compulsive disorder 11/18/2023   Normocytic anemia 11/18/2023   Morbid obesity (HCC) 11/18/2023   Hypertensive retinopathy 11/18/2023   Mixed hyperlipidemia 11/18/2023   Dilatation of aorta 11/18/2023   Paroxysmal atrial fibrillation (HCC) 11/18/2023   Primary open angle glaucoma (POAG) of both eyes 11/18/2023   Reactive airway disease 11/18/2023   Thoracic aortic aneurysm without rupture 11/18/2023   Thrombophilia 11/18/2023   Vitamin D deficiency 11/18/2023   Aortic atherosclerosis    Abnormal findings on diagnostic imaging of lung 01/30/2020    Malignant neoplasm metastatic to bone (HCC) 09/27/2019   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) 09/27/2019   Pacemaker 08/20/2019   Sinus node dysfunction (HCC) 05/21/2019   Palpitations 05/07/2019   Bradycardia 01/30/2019   Globus pharyngeus 01/04/2018   Hoarseness 01/04/2018   SOB (shortness of breath) 12/13/2017   Chronic diastolic heart failure (HCC) 06/26/2017   Coronary artery disease due to lipid rich plaque    Pure hypercholesterolemia    Angina pectoris 07/04/2014   Family history of premature CAD 06/20/2014   Dyspnea on exertion 06/20/2014    Class: Diagnosis of   Hydronephrosis of right kidney    Prostate cancer (HCC)    Dilated aortic root 12/17/2013   Hypothyroidism following radioiodine therapy 03/05/2013   Essential hypertension 11/08/2012   Allergic rhinitis 11/08/2012   Depressive disorder, not elsewhere classified 11/08/2012   GERD (gastroesophageal reflux disease) 11/08/2012   Insomnia 11/08/2012    PCP: Okey Carlin Redbird, MD   REFERRING PROVIDER: Cesario Boer, MD  REFERRING DIAG: M54.16 (ICD-10-CM) - Radiculopathy, lumbar region  Rationale for Evaluation and Treatment: Rehabilitation  THERAPY DIAG:  Other low back pain  Muscle weakness (generalized)  Other abnormalities of gait and mobility  Other symptoms and signs involving the musculoskeletal system  ONSET DATE: 1 year+  SUBJECTIVE:                                                                                                                                                                                           SUBJECTIVE STATEMENT: Patient states numbness in R leg and weakness in the leg now. Symptoms are from thigh to mid/lower shin. No change with spinal injections, lyrica doesn't help. Intermittent pain but not constant  but numbness is constant. Hasn't felt normal in months. Has some light touch feeling. Member at [employer].  PERTINENT HISTORY:  CHF, CAD, HTN, HLD, pacemaker, afib, hx  prostate cancer with METS  PAIN:  Are you having pain? Yes: NPRS scale: 5-7/10 Pain location: R thigh to shin Pain description: numb Aggravating factors: constant Relieving factors:    PRECAUTIONS: None and Other: light lifting  WEIGHT BEARING RESTRICTIONS: No  FALLS:  Has patient fallen in last 6 months? Yes. Number of falls 3-4  OCCUPATION: Retired   PLOF: Independent  PATIENT GOALS: strengthen R knee and get cardio back up, going to [place]  in May and would like to get back to walking 1-1.5 miles  OBJECTIVE: (objective measures from initial evaluation unless otherwise dated)  PATIENT SURVEYS:  LEFS  Extreme difficulty/unable (0), Quite a bit of difficulty (1), Moderate difficulty (2), Little difficulty (3), No difficulty (4) Survey date:  06/13/24  Any of your usual work, housework or school activities 2  2. Usual hobbies, recreational or sporting activities 2  3. Getting into/out of the bath 4  4. Walking between rooms 4  5. Putting on socks/shoes 3  6. Squatting  3  7. Lifting an object, like a bag of groceries from the floor 4  8. Performing light activities around your home 3  9. Performing heavy activities around your home 1  10. Getting into/out of a car 2  11. Walking 2 blocks 1  12. Walking 1 mile 0  13. Going up/down 10 stairs (1 flight) 1  14. Standing for 1 hour 2  15.  sitting for 1 hour 4  16. Running on even ground 0  17. Running on uneven ground 0  18. Making sharp turns while running fast 0  19. Hopping  2  20. Rolling over in bed 4  Score total:  42/80     SCREENING FOR RED FLAGS: Bowel or bladder incontinence: No Spinal tumors: No Cauda equina syndrome:  No Compression fracture: No Abdominal aneurysm: No  COGNITION: Overall cognitive status: Within functional limits for tasks assessed     SENSATION: Impaired L3-L4 on RLE   POSTURE: decreased lumbar lordosis and increased thoracic kyphosis  PALPATION: No tenderness grossly  LUMBAR  ROM:   AROM eval  Flexion 25% limited  Extension 25% limited (felt good)  Right lateral flexion 0% limited  Left lateral flexion 0% limited  Right rotation 0% limited  Left rotation 0% limited   (Blank rows = not tested) * = pain/symptoms  LOWER EXTREMITY ROM:   WFL for tasks assessed  Active  Right eval Left eval  Hip flexion    Hip extension    Hip abduction    Hip adduction    Hip internal rotation    Hip external rotation    Knee flexion    Knee extension    Ankle dorsiflexion    Ankle plantarflexion    Ankle inversion    Ankle eversion     (Blank rows = not tested) * = pain/symptoms  LOWER EXTREMITY MMT:    MMT Right eval Left eval  Hip flexion 4- 5  Hip extension 3- 3+  Hip abduction 4- 4  Hip adduction    Hip internal rotation    Hip external rotation    Knee flexion 4 5  Knee extension 4 5  Ankle dorsiflexion 4+ 5  Ankle plantarflexion    Ankle inversion    Ankle eversion     (Blank rows = not tested) * = pain/symptoms  FUNCTIONAL TESTS:  5 times sit to stand: 16.18 seconds without UE support, relies LLE>RLE Stairs: 7 inch, tends to use step to pattern, can perform alternating pattern with cueing, increased effort on R stance ascending, decreased eccentric strength on R Femoral nerve tension test: no change in symptoms Repeated lumbar extension: no change in symptoms  GAIT: Distance walked: 100 feet Assistive device utilized: None Level of assistance: Complete Independence Comments:   TODAY'S TREATMENT:                                                                                                                              DATE:  06/13/24  Eval, HEP, education   PATIENT EDUCATION:  Education details: Patient educated on exam findings, POC, scope of PT, HEP, relevant anatomy and biomechanics, dosing of exercises. Person educated: Patient Education method: Explanation, Demonstration, and Handouts Education comprehension: verbalized  understanding, returned demonstration, verbal cues required, and tactile cues required  HOME EXERCISE PROGRAM: Access Code: [email or web address] URL: [email or web address] Date: 06/13/2024 Prepared by: Prentice Malaky Tetrault  Exercises - Standing Lumbar Extension  - 2-3 x daily - 7 x weekly - 1-2 sets - 10 reps - Supine Bridge  - 1 x daily - 7 x weekly - 2 sets - 10 reps - Seated Long Arc Quad  - 1 x daily - 7 x weekly - 2-3 sets - 10 reps - 5-10 second holds hold  ASSESSMENT:  CLINICAL IMPRESSION: Patient a 74 y.o. y.o. male who was seen today for physical therapy evaluation and treatment for lumbar radiculopathy. Patient presents with pain limited deficits in lumbar spine and LE strength, ROM, endurance, activity tolerance, and functional mobility with ADL. Patient is having to modify and restrict ADL as indicated by outcome measure score as well as subjective information and objective measures which is affecting overall participation. Patient will benefit from skilled physical therapy in order to improve function and reduce impairment.  OBJECTIVE IMPAIRMENTS: Abnormal gait, decreased activity tolerance, decreased balance, decreased endurance, decreased mobility, difficulty walking, decreased ROM, decreased strength, hypomobility, increased muscle spasms, impaired flexibility, improper body mechanics, postural dysfunction, and pain  ACTIVITY LIMITATIONS: carrying, lifting, bending, standing, squatting, stairs, transfers, bed mobility, reach over head, locomotion level, and caring for others  PARTICIPATION LIMITATIONS:  meal prep, cleaning, laundry, shopping, community activity, and yard work  PERSONAL FACTORS: Fitness, Time since onset of injury/illness/exacerbation, and 3+ comorbidities: CHF, CAD, HTN, HLD, afib, hx prostate cancer  are also affecting patient's functional outcome.   REHAB POTENTIAL: Good  CLINICAL DECISION MAKING: Evolving/moderate complexity  EVALUATION COMPLEXITY:  Moderate   GOALS: Goals reviewed with patient? Yes  SHORT TERM GOALS: Target date: 07/25/2024    Patient will be independent with HEP in order to improve functional outcomes. Baseline:  Goal status: INITIAL  2.  Patient will report at least 25% improvement in symptoms for improved quality of life. Baseline:  Goal status: INITIAL    LONG TERM GOALS: Target date: 09/05/2024    Patient will report at least 75% improvement in symptoms for improved quality of life. Baseline:  Goal status: INITIAL  2.  Patient will improve LEFS score by at least 18 points in order to indicate improved tolerance to activity. Baseline:  Goal status: INITIAL  3.  Patient will get back to walking 1-1.5 miles for improved ability to ambulate in [place] .  Baseline:  Goal status: INITIAL  4.  Patient will be able to complete 5x STS in under 11.4 seconds in order to demonstrate improved functional strength. Baseline:  Goal status: INITIAL  5.  Patient will demonstrate grade of 5/5 MMT grade in all tested musculature as evidence of improved strength to assist with stair ambulation and gait.   Baseline:  Goal status: INITIAL     PLAN:  PT FREQUENCY: 1-2x/week  PT DURATION: 12 weeks  PLANNED INTERVENTIONS: [id number]- PT Re-evaluation, 97110-Therapeutic exercises, 97530- Therapeutic activity, [id number]- Neuromuscular re-education, 97535- Self Care, [id number]- Manual therapy, [id number]- Gait training, [phone]- Orthotic Fit/training, [phone]- Canalith repositioning, [id number]- Aquatic Therapy, [phone]- Splinting, [phone]- Wound care (first 20 sq cm), 97598- Wound care (each additional 20 sq cm)Patient/Family education, Balance training, Stair training, Taping, Dry Needling, Joint mobilization, Joint manipulation, Spinal manipulation, Spinal mobilization, Scar mobilization, and DME instructions.  PLAN FOR NEXT SESSION: f/u with HEP, glute and LE strengthening, functional strength, cardio/endurance training, build HEP/gym  routine   Prentice GORMAN Stains, PT, DPT 06/13/2024, 12:51 PM  "

## 2024-06-18 LAB — BASIC METABOLIC PANEL WITH GFR
BUN/Creatinine Ratio: 29 — ABNORMAL HIGH (ref 10–24)
BUN: 35 mg/dL — ABNORMAL HIGH (ref 8–27)
CO2: 21 mmol/L (ref 20–29)
Calcium: 9.4 mg/dL (ref 8.6–10.2)
Chloride: 104 mmol/L (ref 96–106)
Creatinine, Ser: 1.2 mg/dL (ref 0.76–1.27)
Glucose: 101 mg/dL — ABNORMAL HIGH (ref 70–99)
Potassium: 4.3 mmol/L (ref 3.5–5.2)
Sodium: 142 mmol/L (ref 134–144)
eGFR: 64 mL/min/1.73

## 2024-06-19 ENCOUNTER — Ambulatory Visit: Admitting: Internal Medicine

## 2024-06-19 ENCOUNTER — Encounter: Payer: Self-pay | Admitting: Internal Medicine

## 2024-06-19 ENCOUNTER — Ambulatory Visit (HOSPITAL_BASED_OUTPATIENT_CLINIC_OR_DEPARTMENT_OTHER): Payer: Self-pay | Admitting: Family

## 2024-06-19 VITALS — BP 101/65 | HR 66 | Temp 98.2°F | Ht 71.0 in | Wt 271.0 lb

## 2024-06-19 DIAGNOSIS — R0989 Other specified symptoms and signs involving the circulatory and respiratory systems: Secondary | ICD-10-CM | POA: Diagnosis not present

## 2024-06-19 DIAGNOSIS — R053 Chronic cough: Secondary | ICD-10-CM

## 2024-06-19 DIAGNOSIS — Z8546 Personal history of malignant neoplasm of prostate: Secondary | ICD-10-CM | POA: Diagnosis not present

## 2024-06-19 LAB — NITRIC OXIDE: Nitric Oxide: 22

## 2024-06-19 LAB — CBC WITH DIFFERENTIAL/PLATELET
Basophils Absolute: 0.1 K/uL (ref 0.0–0.1)
Basophils Relative: 1.1 % (ref 0.0–3.0)
Eosinophils Absolute: 0.1 K/uL (ref 0.0–0.7)
Eosinophils Relative: 2.2 % (ref 0.0–5.0)
HCT: 34.4 % — ABNORMAL LOW (ref 39.0–52.0)
Hemoglobin: 11.2 g/dL — ABNORMAL LOW (ref 13.0–17.0)
Lymphocytes Relative: 19.3 % (ref 12.0–46.0)
Lymphs Abs: 1 K/uL (ref 0.7–4.0)
MCHC: 32.6 g/dL (ref 30.0–36.0)
MCV: 84 fl (ref 78.0–100.0)
Monocytes Absolute: 0.5 K/uL (ref 0.1–1.0)
Monocytes Relative: 10.2 % (ref 3.0–12.0)
Neutro Abs: 3.4 K/uL (ref 1.4–7.7)
Neutrophils Relative %: 67.2 % (ref 43.0–77.0)
Platelets: 297 K/uL (ref 150.0–400.0)
RBC: 4.09 Mil/uL — ABNORMAL LOW (ref 4.22–5.81)
RDW: 15.9 % — ABNORMAL HIGH (ref 11.5–15.5)
WBC: 5.1 K/uL (ref 4.0–10.5)

## 2024-06-19 MED ORDER — AZELASTINE HCL 0.1 % NA SOLN: 2.0000 | Freq: Two times a day (BID) | NASAL | 12 refills | Status: AC

## 2024-06-19 NOTE — Patient Instructions (Addendum)
"    ICD-10-CM   1. Chronic cough  R05.3 CBC w/Diff    Perennial allergen profile IgE    Pulmonary function test    2. History of prostate cancer  Z85.46 CBC w/Diff    Perennial allergen profile IgE    Pulmonary function test    3. Chronic sinus complaints  R09.89 CBC w/Diff    Perennial allergen profile IgE    Pulmonary function test       Because of the cough appears not clear but the fact Mucinex helps it and you have significant chronic sinus congestion makes me wonder if there are some kind of allergy or asthma-like component  Lung images on the cardiac PET scan in July 2025 were reassuring but I do realize that the cough started after that  Exhaled nitric oxide  test is normal arguing against type II airway asthma  Plan - Do saline nasal spray daily 2 squirts into each nostril [over-the-counter treatment] - Do Astelin  nasal spray 2 squirts into each nostril twice daily = Check blood work for CBC differential, blood IgE and allergy panel -Await results of the CT lung image from the CT aorta being done in February 2026  - Based on these results you might or might not need a high-resolution CT chest to look at the lungs --Do spirometry pre and postbronchodilator response  Follow-up - 4 weeks nurse practitioner for follow-up and deciding the next steps "

## 2024-06-19 NOTE — Progress Notes (Signed)
 "      OV 12/26/2019  Subjective:  Patient ID: Blake Carter, male , DOB: Jan 15, 1951 , age 74 y.o. , MRN: [id number] , ADDRESS: [address] [place] [address] PCP Morgan Drivers, MD   12/26/2019 -   Chief Complaint  Patient presents with   Consult    Pt is being referred by Dr. Wilbert Bihari. Pt states that he does have problems with his breathing when he goes up steps. Pt also has some occ chest discomfort near his heart. 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     HPI Blake Carter 74 y.o. -he has been on lisinopril  at low-dose for 5 mg for many years.  Earlier in 2021 he became hypothyroid and Synthroid  was increased.  According to him after that his TSH dropped extremely low.  Now he is backing off on the Synthroid  and has been referred to an endocrinologist.  Since early this year he has had insidious onset of shortness of breath worse with exertion such as climbing steps.  He notices he gets very tachycardic when he does steps.  Relieved by rest.  No associated wheezing.  Then in summer 2021 he did have a pacemaker placed but since then the shortness of breath has persisted without any change.  However he became more hypertensive and his lisinopril  was increased and amlodipine  was added.  Since then he has a very mild occasional chronic cough.  Clearly the shortness of breath is more dominant symptom.  He is known to have aortic dilatation he is having a CT angiogram chest for that tomorrow.  No known history of asthma.  No known smoking history.  No known COPD no known pulmonary fibrosis.  He is known to have prostate cancer and has had radiation for this.  Details of this were reviewed in the chart.  He is also known to have diastolic dysfunction.  He says over the pandemic he has lost 10 to 15 pounds of weight.  He does have mild snoring but denies other symptoms of sleep apnea.      Results for HALO, LASKI (MRN [id number]) as of 12/26/2019 08:58   Ref. Range 05/07/2019 13:19  Hemoglobin Latest Ref Range: 13.0 - 17.7 g/dL 87.9 (L)    Results for HARUO, STEPANEK (MRN [id number]) as of 12/26/2019 08:58  Ref. Range 12/11/2019 10:03  Creatinine Latest Ref Range: 0.76 - 1.27 mg/dL 8.92   Results for SANJITH, SIWEK (MRN [id number]) as of 12/26/2019 08:58  Ref. Range 04/05/2017 12:19 12/13/2017 11:39 10/31/2018 12:47 03/28/2019 11:32 05/07/2019 13:19  Eosinophils Absolute Latest Ref Range: 0 - 0 K/uL   0.02 Jul 2020   High-resolution CAT scan showed no evidence of fibrotic interstitial lung disease, you had some scarring at right lung base consistent with prior infection or aspiration  Echocardiogram showed normal ejection fraction with grade 1 diastolic dysfunction (heart failure)  Labs showed slightly elevated allergy markers with eosinophils of 300 and IgE 42  Function testing in 2018 showed mild restriction/air trapping.  Overall they were normal.  No evidence of COPD or over asthma as you do not have significant bronchodilator response  Recommendations - Continue Allegra daily - Use Flonase nasal spray as needed for congestion - You can use albuterol  rescue inhaler 2 puffs every 4-6 hours as needed for breakthrough shortness of breath or wheezing - Continue to work on weight loss efforts and increasing physical activity level  Follow-up - As needed  with our office if symptoms worsen   OV 06/19/2024  Subjective:  Patient ID: Blake Carter, male , DOB: [birth year] , age 89 y.o. , MRN: [id number] , ADDRESS: [address] [place] [address] PCP Okey Carlin Redbird, MD Patient Care Team: Okey Carlin Redbird, MD as PCP - General (Family Medicine) Shlomo Wilbert SAUNDERS, MD as PCP - Cardiology (Cardiology) Mealor, Eulas BRAVO, MD as PCP - Electrophysiology (Cardiology) Shlomo Wilbert SAUNDERS, MD as Consulting Physician (Cardiology) Celestia Agent, MD (Inactive) as Consulting Physician (Gastroenterology) Renda Glance, MD as  Consulting Physician (Urology)  This Provider for this visit: Treatment Team:  Attending Provider: Geronimo Amel, MD    06/19/2024 -   Chief Complaint  Patient presents with   Consult    Pt states primary care wants Dry cough checked out 1 cough was occurring due to lisinopril , changed meds cough still occurring  SOB occurs w/ exertion ( going upstairs)     HPI Blake Carter 74 y.o. -Blake Carter is a 74 year old male with stage four metastatic prostate cancer who presents with a persistent cough. He was referred by Dr. Okey to evaluate his persistent cough.  He has been experiencing a persistent cough for several months, beginning around Thanksgiving. The cough is significantly bothersome, occurring every ten minutes without medication, and reducing to every three to four hours with Mucinex DM. It is accompanied by clear or white phlegm, occasionally brown, but there is no green or blood-tinged sputum. A tickle in the throat is noted, and the cough worsens with excessive activity and singing. Over-the-counter medications like NyQuil and DayQuil provide some relief.  He has a history of stage four metastatic prostate cancer, diagnosed 15 years ago, and is under the care of Dr. Noretta Renda. In November, he underwent a cholecystectomy during which lesions were found on his diaphragm, confirmed to be metastatic prostate cancer. A PET scan in early December did not reveal any treatable lesions. He has undergone extensive radiation therapy for prostate cancer, including treatments to the lumbar and cervical spine.  He has a history of lisinopril -induced cough, which resolved after discontinuation of the medication around Christmas. He also experiences shortness of breath on exertion, which has been a long-standing issue. No wheezing is present, but he has a runny nose with clear mucus. An ENT performed a nasal endoscopy and did not find any growths or abnormalities, according to the  patient's recollection.   Exhaled nitric oxide  test today is normal      Dr Felice Reflux Symptom Index (> 13-15 suggestive of LPR cough)  06/19/2024 Feno 22  Hoarseness of problem with voice 2  Clearing  Of Throat 2  Excess throat mucus or feeling of post nasal drip 4  Difficulty swallowing food, liquid or tablets 0  Cough after eating or lying down 0  Breathing difficulties or choking episodes 0  Troublesome or annoying cough 4  Sensation of something sticking in throat or lump in throat 0  Heartburn, chest pain, indigestion, or stomach acid coming up 0  TOTAL 12     Simple office walk 185 feet x  3 laps goal with forehead probe 12/26/2019   O2 used ra  Number laps completed 3  Comments about pace Mod pace  Resting Pulse Ox/HR 100% and 69/min  Final Pulse Ox/HR 99% and 85/min  Desaturated </= 88% no  Desaturated <= 3% points no  Got Tachycardic >/= 90/min no  Symptoms at end of test Super mild dyspnea maybe  Miscellaneous  comments x    Latest Reference Range & Units 04/11/24 11:30  AFP, Serum, Tumor Marker 0.0 - 8.4 ng/mL 2.6  CA 19-9 0 - 35 U/mL <2  CEA 0.0 - 4.7 ng/mL 1.8  Prostatic Specific Antigen 0.00 - 4.00 ng/mL 5.60 (H)  (H): Data is abnormally high  PET SCAN July 20225: viosualized and lungs clear PFT     Latest Ref Rng & Units 05/17/2017   10:44 AM  PFT Results  FVC-Pre L 3.66   FVC-Predicted Pre % 76   FVC-Post L 3.75   FVC-Predicted Post % 78   Pre FEV1/FVC % % 88   Post FEV1/FCV % % 87   FEV1-Pre L 3.21   FEV1-Predicted Pre % 90   FEV1-Post L 3.28   DLCO uncorrected ml/min/mmHg 28.06   DLCO UNC% % 83   DLCO corrected ml/min/mmHg 29.00   DLCO COR %Predicted % 86   DLVA Predicted % 96   TLC L 7.27   TLC % Predicted % 100   RV % Predicted % 116        LAB RESULTS last 96 hours No results found.       has a past medical history of Allergic rhinitis, Anemia associated with cervical cancer treated with erythropoietin (HCC),  Aortic atherosclerosis, Arthritis, Ascending aorta dilatation, Chronic diastolic CHF (congestive heart failure) (HCC), Coronary artery disease, Depression, Dilated aortic root, ED (erectile dysfunction), Environmental allergies, Fatty liver, GERD (gastroesophageal reflux disease), History of hiatal hernia, Hydronephrosis of right kidney, Hyperlipidemia, Hypertension, Hypothyroidism, OCD (obsessive compulsive disorder), Pacemaker, PAF (paroxysmal atrial fibrillation) (HCC), Panic disorder, PAT (paroxysmal atrial tachycardia), Pneumonia, PONV (postoperative nausea and vomiting), Prostate cancer (HCC), PVC's (premature ventricular contractions), Sinus bradycardia, and SUI (stress urinary incontinence), male.   reports that he has never smoked. He has never used smokeless tobacco.  Past Surgical History:  Procedure Laterality Date   CHOLECYSTECTOMY N/A 04/11/2024   Procedure: LAPAROSCOPIC CHOLECYSTECTOMY;  Surgeon: Ann Fine, MD;  Location: [facility];  Service: General;  Laterality: N/A;   COLONOSCOPY  07/29/2013   Dr. Celestia   CORONARY ANGIOPLASTY WITH STENT PLACEMENT  06/20/2014   2   CORONARY PRESSURE/FFR STUDY N/A 07/08/2020   Procedure: INTRAVASCULAR PRESSURE WIRE/FFR STUDY;  Surgeon: Claudene Victory ORN, MD;  Location: [facility];  Service: Cardiovascular;  Laterality: N/A;   CYSTOSCOPY  11/30/2011   Procedure: CYSTOSCOPY;  Surgeon: Glendia DELENA Elizabeth, MD;  Location: [facility];  Service: Urology;  Laterality: N/A;  Inplantation of Artificial Sphincter and Cystoscopy   CYSTOSCOPY WITH RETROGRADE PYELOGRAM, URETEROSCOPY AND STENT PLACEMENT Right 02/11/2014   Procedure: CYSTOSCOPY WITH RETROGRADE PYELOGRAM, URETEROSCOPY ,URETERAL BIOPSY AND STENT PLACEMENT;  Surgeon: Gretel Ferrara, MD;  Location: [facility];  Service: Urology;  Laterality: Right;   CYSTOSCOPY WITH RETROGRADE PYELOGRAM, URETEROSCOPY AND STENT PLACEMENT Right 04/15/2014   Procedure: CYSTOSCOPY WITH RETROGRADE PYELOGRAM,  URETEROSCOPY, BIOPSY AND STENT PLACEMENT;  Surgeon: Gretel Ferrara, MD;  Location: [facility];  Service: Urology;  Laterality: Right;   LEFT HEART CATH AND CORONARY ANGIOGRAPHY N/A 03/29/2017   Procedure: LEFT HEART CATH AND CORONARY ANGIOGRAPHY;  Surgeon: Anner Alm ORN, MD;  Location: [facility] CV LAB;  Service: Cardiovascular;  Laterality: N/A;   LEFT HEART CATHETERIZATION WITH CORONARY ANGIOGRAM N/A 06/20/2014   Procedure: LEFT HEART CATHETERIZATION WITH CORONARY ANGIOGRAM;  Surgeon: Alm ORN Anner, MD;  Location: [facility] CATH LAB;  Service: Cardiovascular;  Laterality: N/A;   PACEMAKER IMPLANT N/A 05/21/2019   Procedure: PACEMAKER IMPLANT;  Surgeon: Waddell Danelle ORN,  MD;  Location: [facility];  Service: Cardiovascular;  Laterality: N/A;   PERCUTANEOUS CORONARY STENT INTERVENTION (PCI-S)  06/20/2014   Procedure: PERCUTANEOUS CORONARY STENT INTERVENTION (PCI-S);  Surgeon: Alm LELON Clay, MD;  Location: [facility] CATH LAB;  Service: Cardiovascular;;  LAD and Circumflex   REFRACTIVE SURGERY Bilateral 05/31/2002   RIGHT/LEFT HEART CATH AND CORONARY ANGIOGRAPHY N/A 07/08/2020   Procedure: RIGHT/LEFT HEART CATH AND CORONARY ANGIOGRAPHY;  Surgeon: Claudene Victory LELON, MD;  Location: [facility];  Service: Cardiovascular;  Laterality: N/A;   ROBOT ASSISTED LAPAROSCOPIC RADICAL PROSTATECTOMY  05/31/2010   THYROIDECTOMY, PARTIAL  10/30/1974   TONSILLECTOMY  05/31/1954   TOTAL THYROIDECTOMY  10/29/2012   nuked it   UPPER GI ENDOSCOPY     food impaction 2009 done by Dr Celestia   UPPER GI ENDOSCOPY     URINARY SPHINCTER IMPLANT  11/30/2011   Procedure: ARTIFICIAL URINARY SPHINCTER;  Surgeon: Glendia DELENA Elizabeth, MD;  Location: [facility];  Service: Urology;  Laterality: N/A;    Allergies[1]  Immunization History  Administered Date(s) Administered   Fluad Quad(high Dose 65+) 03/29/2020, 03/05/2021   INFLUENZA, HIGH DOSE SEASONAL PF 03/22/2018, 03/13/2019, 03/04/2022   Influenza,inj,Quad PF,6+ Mos  03/05/2013   Influenza-Unspecified 02/28/2014, 03/08/2017, 03/01/2018   PFIZER Comirnaty(Gray Top)Covid-19 Tri-Sucrose Vaccine 10/29/2020   PFIZER(Purple Top)SARS-COV-2 Vaccination 06/22/2019, 07/13/2019, 01/13/2020   PNEUMOCOCCAL CONJUGATE-20 10/29/2020   Pfizer Covid-19 Vaccine Bivalent Booster 62yrs & up 03/05/2021   Pfizer(Comirnaty)Fall Seasonal Vaccine 12 years and older 02/09/2024   Respiratory Syncytial Virus Vaccine,Recomb Aduvanted(Arexvy) 03/04/2022   Tdap 02/29/2016   Unspecified SARS-COV-2 Vaccination 03/04/2022    Family History  Problem Relation Age of Onset   Cancer Father    Hyperlipidemia Father    Heart disease Father        Father had CABG/aorta repair by the time he was in his 47s   CAD Brother        Brother who is 10 years younger has had some sort of stenting    Current Medications[2]      Objective:   Vitals:   06/19/24 0958  BP: 101/65  Pulse: 66  Temp: 98.2 F (36.8 C)  TempSrc: Oral  SpO2: 94%  Weight: 271 lb (122.9 kg)  Height: 5' 11 (1.803 m)    Estimated body mass index is 37.8 kg/m as calculated from the following:   Height as of this encounter: 5' 11 (1.803 m).   Weight as of this encounter: 271 lb (122.9 kg).  @WEIGHTCHANGE @  Filed Weights   06/19/24 0958  Weight: 271 lb (122.9 kg)     Physical Exam   General: No distress. Looks well O2 at rest: no Cane present: no Sitting in wheel chair: no Frail: no Obese: YES Neuro: Alert and Oriented x 3. GCS 15. Speech normal Psych: Pleasant Resp:  Barrel Chest - no.  Wheeze - no, Crackles - no, No overt respiratory distress CVS: Normal heart sounds. Murmurs - no Ext: Stigmata of Connective Tissue Disease - no HEENT: Normal upper airway. PEERL +. No post nasal drip        Assessment/     Assessment & Plan Chronic cough  History of prostate cancer  Chronic sinus complaints    PLAN Patient Instructions     ICD-10-CM   1. Chronic cough  R05.3 CBC w/Diff     Perennial allergen profile IgE    Pulmonary function test    2. History of prostate cancer  Z85.46 CBC w/Diff  Perennial allergen profile IgE    Pulmonary function test    3. Chronic sinus complaints  R09.89 CBC w/Diff    Perennial allergen profile IgE    Pulmonary function test       Because of the cough appears not clear but the fact Mucinex helps it and you have significant chronic sinus congestion makes me wonder if there are some kind of allergy or asthma-like component  Lung images on the cardiac PET scan in July 2025 were reassuring but I do realize that the cough started after that  Exhaled nitric oxide  test is normal arguing against type II airway asthma  Plan - Do saline nasal spray daily 2 squirts into each nostril [over-the-counter treatment] - Do Astelin  nasal spray 2 squirts into each nostril twice daily = Check blood work for CBC differential, blood IgE and allergy panel -Await results of the CT lung image from the CT aorta being done in February 2026  - Based on these results you might or might not need a high-resolution CT chest to look at the lungs --Do spirometry pre and postbronchodilator response  Follow-up - 4 weeks nurse practitioner for follow-up and deciding the next steps    FOLLOWUP    Return in about 4 weeks (around 07/17/2024) for with any of the APPS, Face to Face Visit.    SIGNATURE    Dr. Dorethia Cave, M.D., F.C.C.P,  Pulmonary and Critical Care Medicine Staff Physician, [facility] Center Director - Interstitial Lung Disease  Program  Pulmonary Fibrosis [facility] [place], [place], [address]  Pager: [phone], If no answer or between  15:00h - 7:00h: call [phone] Telephone: [phone]  10:35 AM 06/19/2024     [1]  Allergies Allergen Reactions   Cimetidine Other (See Comments)    Unknown - over 30 years ago (2021) - unsure what occurred it may have been hives or  flushing Other reaction(s): flushed   Atorvastatin      Myalgias on 80mg  dose   Niacin And Related Other (See Comments)    Flushing   [2]  Current Outpatient Medications:    acetaminophen  (TYLENOL ) 500 MG tablet, Take 1,000 mg by mouth every 6 (six) hours as needed for moderate pain (pain score 4-6)., Disp: , Rfl:    ALPRAZolam  (XANAX ) 0.25 MG tablet, Take 1 tablet (0.25 mg total) by mouth 2 (two) times daily as needed for anxiety. (Patient taking differently: Take 0.25 mg by mouth daily as needed (turbulence on flights).), Disp: 60 tablet, Rfl: 2   amLODipine -olmesartan  (AZOR ) 10-40 MG tablet, Take 1 tablet by mouth daily., Disp: 90 tablet, Rfl: 3   apixaban  (ELIQUIS ) 5 MG TABS tablet, Take 1 tablet (5 mg total) by mouth 2 (two) times daily., Disp: 180 tablet, Rfl: 3   azelastine  (ASTELIN ) 0.1 % nasal spray, Place 2 sprays into both nostrils 2 (two) times daily. Use in each nostril as directed, Disp: 30 mL, Rfl: 12   darolutamide (NUBEQA) 300 MG tablet, Take 300 mg by mouth 2 (two) times daily with a meal., Disp: , Rfl:    diazepam  (VALIUM ) 5 MG tablet, TAKE 1 TABLET BY MOUTH DAILY AS NEEDED FOR ANXIETY. (Patient taking differently: Take 5 mg by mouth at bedtime as needed (sleep).), Disp: 30 tablet, Rfl: 3   fenofibrate  (TRICOR ) 145 MG tablet, TAKE 1 TABLET BY MOUTH EVERY DAY, Disp: 90 tablet, Rfl: 1   fluvoxaMINE  (LUVOX ) 100 MG tablet, TAKE  3 TABLETS (300 MG TOTAL) DAILY. (Patient taking differently: Take 200 mg by mouth.), Disp: 270 tablet, Rfl: 1   furosemide  (LASIX ) 20 MG tablet, Take 1 tablet (20 mg total) by mouth daily as needed for fluid or edema., Disp: 30 tablet, Rfl: 2   GEMTESA 75 MG TABS, Take 75 mg by mouth daily., Disp: , Rfl:    latanoprost (XALATAN) 0.005 % ophthalmic solution, Place 1 drop into both eyes at bedtime. , Disp: , Rfl:    leuprolide  (LUPRON  DEPOT, 40-MONTH,) 30 MG injection, Inject 30 mg into the muscle every 6 (six) months., Disp: , Rfl:    levothyroxine  (SYNTHROID )  150 MCG tablet, Take 150 mcg by mouth daily before breakfast., Disp: , Rfl:    metFORMIN (GLUCOPHAGE-XR) 500 MG 24 hr tablet, Take 500 mg by mouth 2 (two) times daily with a meal., Disp: , Rfl:    metoprolol  succinate (TOPROL -XL) 100 MG 24 hr tablet, TAKE 1 TABLET BY MOUTH DAILY. TAKE WITH OR IMMEDIATELY FOLLOWING A MEAL., Disp: 90 tablet, Rfl: 1   montelukast (SINGULAIR) 10 MG tablet, Take 10 mg by mouth at bedtime., Disp: , Rfl:    pantoprazole  (PROTONIX ) 40 MG tablet, TAKE 2 TABLETS BY MOUTH EVERY DAY (Patient taking differently: Take 40 mg by mouth daily.), Disp: 180 tablet, Rfl: 2   pregabalin (LYRICA) 100 MG capsule, Take 100 mg by mouth 2 (two) times daily., Disp: , Rfl:    pseudoephedrine-guaifenesin (MUCINEX D) 60-600 MG 12 hr tablet, Take 1 tablet by mouth every 12 (twelve) hours., Disp: , Rfl:    rosuvastatin  (CRESTOR ) 40 MG tablet, TAKE 1 TABLET BY MOUTH EVERY DAY, Disp: 90 tablet, Rfl: 3   nitroGLYCERIN  (NITROSTAT ) 0.4 MG SL tablet, PLACE 1 TABLET UNDER THE TONGUE EVERY 5 MINUTES AS NEEDED FOR CHEST PAIN. (Patient not taking: Reported on 06/19/2024), Disp: 25 tablet, Rfl: 7   oxyCODONE  (ROXICODONE ) 5 MG immediate release tablet, Take 1 tablet (5 mg total) by mouth every 4 (four) hours as needed., Disp: 15 tablet, Rfl: 0  "

## 2024-06-21 ENCOUNTER — Ambulatory Visit (HOSPITAL_BASED_OUTPATIENT_CLINIC_OR_DEPARTMENT_OTHER): Admitting: Physical Therapy

## 2024-06-21 ENCOUNTER — Encounter (HOSPITAL_BASED_OUTPATIENT_CLINIC_OR_DEPARTMENT_OTHER): Payer: Self-pay | Admitting: Physical Therapy

## 2024-06-21 DIAGNOSIS — M5459 Other low back pain: Secondary | ICD-10-CM

## 2024-06-21 DIAGNOSIS — M6281 Muscle weakness (generalized): Secondary | ICD-10-CM

## 2024-06-21 DIAGNOSIS — M5416 Radiculopathy, lumbar region: Secondary | ICD-10-CM | POA: Diagnosis not present

## 2024-06-21 DIAGNOSIS — R29898 Other symptoms and signs involving the musculoskeletal system: Secondary | ICD-10-CM

## 2024-06-21 DIAGNOSIS — R2689 Other abnormalities of gait and mobility: Secondary | ICD-10-CM

## 2024-06-21 NOTE — Therapy (Unsigned)
 " OUTPATIENT PHYSICAL THERAPY THORACOLUMBAR EVALUATION   Patient Name: Blake Carter MRN: [id number] DOB:06-26-1950, 74 y.o., male Today's Date: 06/21/2024  END OF SESSION:  PT End of Session - 06/21/24 1154     Visit Number 2    Number of Visits 24    Date for Recertification  09/06/24    Authorization Type [employer] medicare    PT Start Time 1149    PT Stop Time 1230    PT Time Calculation (min) 41 min    Activity Tolerance Patient tolerated treatment well    Behavior During Therapy WFL for tasks assessed/performed          Past Medical History:  Diagnosis Date   Allergic rhinitis    Anemia associated with cervical cancer treated with erythropoietin (HCC)    Aortic atherosclerosis    Arthritis    a little bit in some of my joints (06/20/2014)   Ascending aorta dilatation    4.1cm ascending aorta by CT 11/2023   Chronic diastolic CHF (congestive heart failure) (HCC)    Coronary artery disease    a. 05/2014 Cath/PCI: LM nl, LAD 40p, 60-70d (FFR 0.80->3.0x24 Synergy DES), D2 40, LCX 90d (2.25x12 Synergy DES), RCA 20-15m, EF 60-65%. b. Cath 02/2017 without acute change, elev LVEDP suggestive of dCHF.   Depression    Dilated aortic root    aortic root at 44mm by Chest CTA 11/2019   ED (erectile dysfunction)    Environmental allergies    dry cough   Fatty liver    GERD (gastroesophageal reflux disease)    History of hiatal hernia    Hydronephrosis of right kidney    a. s/p ureteral stenting in the past.   Hyperlipidemia    under control   Hypertension    Hypothyroidism    OCD (obsessive compulsive disorder)    Pacemaker    PAF (paroxysmal atrial fibrillation) (HCC)    Panic disorder    PAT (paroxysmal atrial tachycardia)    Pneumonia    PONV (postoperative nausea and vomiting)    after thyroid  surgery no problems in 10 years    Prostate cancer (HCC)    prostate cancer- robotic prostatectomy - followed by radiation because of positive lymph node--and pt on lupron   injections   PVC's (premature ventricular contractions)    Sinus bradycardia    a. chronic, asymptomatic.  24 hour Holter 10/2016 showed Sinus bradycardia, sinus arrhythmia and normal sinus rhythm with average heart rate 50bpm and heart rate ranged from 32 to 100bpm.   SUI (stress urinary incontinence), male    S/P ROBOTIC PROSTATECTOMY AND RADIATION TX   Past Surgical History:  Procedure Laterality Date   CHOLECYSTECTOMY N/A 04/11/2024   Procedure: LAPAROSCOPIC CHOLECYSTECTOMY;  Surgeon: Ann Fine, MD;  Location: [facility];  Service: General;  Laterality: N/A;   COLONOSCOPY  07/29/2013   Dr. Celestia   CORONARY ANGIOPLASTY WITH STENT PLACEMENT  06/20/2014   2   CORONARY PRESSURE/FFR STUDY N/A 07/08/2020   Procedure: INTRAVASCULAR PRESSURE WIRE/FFR STUDY;  Surgeon: Claudene Victory ORN, MD;  Location: [facility];  Service: Cardiovascular;  Laterality: N/A;   CYSTOSCOPY  11/30/2011   Procedure: CYSTOSCOPY;  Surgeon: Glendia DELENA Elizabeth, MD;  Location: [facility];  Service: Urology;  Laterality: N/A;  Inplantation of Artificial Sphincter and Cystoscopy   CYSTOSCOPY WITH RETROGRADE PYELOGRAM, URETEROSCOPY AND STENT PLACEMENT Right 02/11/2014   Procedure: CYSTOSCOPY WITH RETROGRADE PYELOGRAM, URETEROSCOPY ,URETERAL BIOPSY AND STENT PLACEMENT;  Surgeon: Gretel Ferrara, MD;  Location: [facility];  Service: Urology;  Laterality: Right;   CYSTOSCOPY WITH RETROGRADE PYELOGRAM, URETEROSCOPY AND STENT PLACEMENT Right 04/15/2014   Procedure: CYSTOSCOPY WITH RETROGRADE PYELOGRAM, URETEROSCOPY, BIOPSY AND STENT PLACEMENT;  Surgeon: Gretel Ferrara, MD;  Location: [facility];  Service: Urology;  Laterality: Right;   LEFT HEART CATH AND CORONARY ANGIOGRAPHY N/A 03/29/2017   Procedure: LEFT HEART CATH AND CORONARY ANGIOGRAPHY;  Surgeon: Anner Alm ORN, MD;  Location: [facility];  Service: Cardiovascular;  Laterality: N/A;   LEFT HEART CATHETERIZATION WITH CORONARY ANGIOGRAM N/A 06/20/2014   Procedure: LEFT  HEART CATHETERIZATION WITH CORONARY ANGIOGRAM;  Surgeon: Alm ORN Anner, MD;  Location: [facility] CATH LAB;  Service: Cardiovascular;  Laterality: N/A;   PACEMAKER IMPLANT N/A 05/21/2019   Procedure: PACEMAKER IMPLANT;  Surgeon: Waddell Danelle ORN, MD;  Location: [facility];  Service: Cardiovascular;  Laterality: N/A;   PERCUTANEOUS CORONARY STENT INTERVENTION (PCI-S)  06/20/2014   Procedure: PERCUTANEOUS CORONARY STENT INTERVENTION (PCI-S);  Surgeon: Alm ORN Anner, MD;  Location: [facility] CATH LAB;  Service: Cardiovascular;;  LAD and Circumflex   REFRACTIVE SURGERY Bilateral 05/31/2002   RIGHT/LEFT HEART CATH AND CORONARY ANGIOGRAPHY N/A 07/08/2020   Procedure: RIGHT/LEFT HEART CATH AND CORONARY ANGIOGRAPHY;  Surgeon: Claudene Victory ORN, MD;  Location: [facility];  Service: Cardiovascular;  Laterality: N/A;   ROBOT ASSISTED LAPAROSCOPIC RADICAL PROSTATECTOMY  05/31/2010   THYROIDECTOMY, PARTIAL  10/30/1974   TONSILLECTOMY  05/31/1954   TOTAL THYROIDECTOMY  10/29/2012   nuked it   UPPER GI ENDOSCOPY     food impaction 2009 done by Dr Celestia   UPPER GI ENDOSCOPY     URINARY SPHINCTER IMPLANT  11/30/2011   Procedure: ARTIFICIAL URINARY SPHINCTER;  Surgeon: Glendia DELENA Elizabeth, MD;  Location: [facility];  Service: Urology;  Laterality: N/A;   Patient Active Problem List   Diagnosis Date Noted   Obsessive-compulsive disorder 11/18/2023   Normocytic anemia 11/18/2023   Morbid obesity (HCC) 11/18/2023   Hypertensive retinopathy 11/18/2023   Mixed hyperlipidemia 11/18/2023   Dilatation of aorta 11/18/2023   Paroxysmal atrial fibrillation (HCC) 11/18/2023   Primary open angle glaucoma (POAG) of both eyes 11/18/2023   Reactive airway disease 11/18/2023   Thoracic aortic aneurysm without rupture 11/18/2023   Thrombophilia 11/18/2023   Vitamin D deficiency 11/18/2023   Aortic atherosclerosis    Abnormal findings on diagnostic imaging of lung 01/30/2020   Malignant neoplasm metastatic to bone  (HCC) 09/27/2019   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) 09/27/2019   Pacemaker 08/20/2019   Sinus node dysfunction (HCC) 05/21/2019   Palpitations 05/07/2019   Bradycardia 01/30/2019   Globus pharyngeus 01/04/2018   Hoarseness 01/04/2018   SOB (shortness of breath) 12/13/2017   Chronic diastolic heart failure (HCC) 06/26/2017   Coronary artery disease due to lipid rich plaque    Pure hypercholesterolemia    Angina pectoris 07/04/2014   Family history of premature CAD 06/20/2014   Dyspnea on exertion 06/20/2014    Class: Diagnosis of   Hydronephrosis of right kidney    Prostate cancer (HCC)    Dilated aortic root 12/17/2013   Hypothyroidism following radioiodine therapy 03/05/2013   Essential hypertension 11/08/2012   Allergic rhinitis 11/08/2012   Depressive disorder, not elsewhere classified 11/08/2012   GERD (gastroesophageal reflux disease) 11/08/2012   Insomnia 11/08/2012    PCP: Okey Carlin Redbird, MD   REFERRING PROVIDER: Cesario Boer, MD  REFERRING DIAG: M54.16 (ICD-10-CM) - Radiculopathy, lumbar region  Rationale for Evaluation and Treatment: Rehabilitation  THERAPY DIAG:  No diagnosis found.  ONSET DATE: 1 year+  SUBJECTIVE:                                                                                                                                                                                           SUBJECTIVE STATEMENT: Pt reports no real change in sx. Says he's kinda been doing the exercises. Pt reports that extension felt good but no cx in numbness. Pt has an upcoming neurologist appt.   Eval: Patient states numbness in R leg and weakness in the leg now. Symptoms are from thigh to mid/lower shin. No change with spinal injections, lyrica doesn't help. Intermittent pain but not constant  but numbness is constant. Hasn't felt normal in months. Has some light touch feeling. Member at [employer].  PERTINENT HISTORY:  CHF, CAD, HTN, HLD, pacemaker, afib, hx  prostate cancer with METS  PAIN:  Are you having pain? Yes: NPRS scale: 5-7/10 Pain location: R thigh to shin Pain description: numb Aggravating factors: constant Relieving factors:    PRECAUTIONS: None and Other: light lifting  WEIGHT BEARING RESTRICTIONS: No  FALLS:  Has patient fallen in last 6 months? Yes. Number of falls 3-4  OCCUPATION: Retired   PLOF: Independent  PATIENT GOALS: strengthen R knee and get cardio back up, going to [place]  in May and would like to get back to walking 1-1.5 miles  OBJECTIVE: (objective measures from initial evaluation unless otherwise dated)  PATIENT SURVEYS:  LEFS  Extreme difficulty/unable (0), Quite a bit of difficulty (1), Moderate difficulty (2), Little difficulty (3), No difficulty (4) Survey date:  06/13/24  Any of your usual work, housework or school activities 2  2. Usual hobbies, recreational or sporting activities 2  3. Getting into/out of the bath 4  4. Walking between rooms 4  5. Putting on socks/shoes 3  6. Squatting  3  7. Lifting an object, like a bag of groceries from the floor 4  8. Performing light activities around your home 3  9. Performing heavy activities around your home 1  10. Getting into/out of a car 2  11. Walking 2 blocks 1  12. Walking 1 mile 0  13. Going up/down 10 stairs (1 flight) 1  14. Standing for 1 hour 2  15.  sitting for 1 hour 4  16. Running on even ground 0  17. Running on uneven ground 0  18. Making sharp turns while running fast 0  19. Hopping  2  20. Rolling over in bed 4  Score total:  42/80     SCREENING FOR RED FLAGS: Bowel or bladder incontinence: No Spinal tumors: No  Cauda equina syndrome: No Compression fracture: No Abdominal aneurysm: No  COGNITION: Overall cognitive status: Within functional limits for tasks assessed     SENSATION: Impaired L3-L4 on RLE   POSTURE: decreased lumbar lordosis and increased thoracic kyphosis  PALPATION: No tenderness grossly  LUMBAR  ROM:   AROM eval  Flexion 25% limited  Extension 25% limited (felt good)  Right lateral flexion 0% limited  Left lateral flexion 0% limited  Right rotation 0% limited  Left rotation 0% limited   (Blank rows = not tested) * = pain/symptoms  LOWER EXTREMITY ROM:   WFL for tasks assessed  Active  Right eval Left eval  Hip flexion    Hip extension    Hip abduction    Hip adduction    Hip internal rotation    Hip external rotation    Knee flexion    Knee extension    Ankle dorsiflexion    Ankle plantarflexion    Ankle inversion    Ankle eversion     (Blank rows = not tested) * = pain/symptoms  LOWER EXTREMITY MMT:    MMT Right eval Left eval  Hip flexion 4- 5  Hip extension 3- 3+  Hip abduction 4- 4  Hip adduction    Hip internal rotation    Hip external rotation    Knee flexion 4 5  Knee extension 4 5  Ankle dorsiflexion 4+ 5  Ankle plantarflexion    Ankle inversion    Ankle eversion     (Blank rows = not tested) * = pain/symptoms  FUNCTIONAL TESTS:  5 times sit to stand: 16.18 seconds without UE support, relies LLE>RLE Stairs: 7 inch, tends to use step to pattern, can perform alternating pattern with cueing, increased effort on R stance ascending, decreased eccentric strength on R Femoral nerve tension test: no change in symptoms Repeated lumbar extension: no change in symptoms  GAIT: Distance walked: 100 feet Assistive device utilized: None Level of assistance: Complete Independence Comments:   TODAY'S TREATMENT:                                                                                                                              DATE:  1/22 Manual:  STM to lower back/paraspinals and upper glutes Grade 3&4 distraction bilat  There-ex: Supine bridge 3x10  Supine abd 3x10 GTB  Supine SLR x8 bilat  Pt ed: TrA activation and postural breathing PT provided ed on how loosening up muscles in the back may help with the numbness and tingling if  nerve is getting compressed.  06/13/24  Eval, HEP, education   PATIENT EDUCATION:  Education details: Patient educated on exam findings, POC, scope of PT, HEP, relevant anatomy and biomechanics, dosing of exercises. Person educated: Patient Education method: Explanation, Demonstration, and Handouts Education comprehension: verbalized understanding, returned demonstration, verbal cues required, and tactile cues required  HOME EXERCISE PROGRAM: Access Code: [email or web address] URL: [email or web address] Date: 06/13/2024 Prepared by: Andrew Zaunegger  Exercises - Standing Lumbar Extension  - 2-3 x daily - 7 x weekly - 1-2 sets - 10 reps - Supine Bridge  - 1 x daily - 7 x weekly - 2 sets - 10 reps - Seated Long Arc Quad  - 1 x daily - 7 x weekly - 2-3 sets - 10 reps - 5-10 second holds hold  ASSESSMENT:  CLINICAL IMPRESSION: Pt is continuing to have sx of numbness in their legs. PT focused on provided STM to paraspinals and distraction to try and create more space in the back to alleviate some numbness and tingling. PT provided ed on postural breath control and engaging the TrA during exercise and the benefits of strengthening the whole system. Pt tolerated exercises well and stated that they felt good. Pt will benefit from continued PT intervention in order to get back to ADLs with less pain.   Eval: Patient a 74 y.o. y.o. male who was seen today for physical therapy evaluation and treatment for lumbar radiculopathy. Patient presents with pain limited deficits in lumbar spine and LE strength, ROM, endurance, activity tolerance, and functional mobility with ADL. Patient is having to modify and restrict ADL as indicated by outcome measure score as well as subjective information and objective measures which is affecting overall participation. Patient will benefit from skilled physical therapy in order to improve function and reduce impairment.  OBJECTIVE IMPAIRMENTS: Abnormal gait,  decreased activity tolerance, decreased balance, decreased endurance, decreased mobility, difficulty walking, decreased ROM, decreased strength, hypomobility, increased muscle spasms, impaired flexibility, improper body mechanics, postural dysfunction, and pain  ACTIVITY LIMITATIONS: carrying, lifting, bending, standing, squatting, stairs, transfers, bed mobility, reach over head, locomotion level, and caring for others  PARTICIPATION LIMITATIONS:  meal prep, cleaning, laundry, shopping, community activity, and yard work  PERSONAL FACTORS: Fitness, Time since onset of injury/illness/exacerbation, and 3+ comorbidities: CHF, CAD, HTN, HLD, afib, hx prostate cancer  are also affecting patient's functional outcome.   REHAB POTENTIAL: Good  CLINICAL DECISION MAKING: Evolving/moderate complexity  EVALUATION COMPLEXITY: Moderate   GOALS: Goals reviewed with patient? Yes  SHORT TERM GOALS: Target date: 07/25/2024    Patient will be independent with HEP in order to improve functional outcomes. Baseline:  Goal status: INITIAL  2.  Patient will report at least 25% improvement in symptoms for improved quality of life. Baseline:  Goal status: INITIAL    LONG TERM GOALS: Target date: 09/05/2024    Patient will report at least 75% improvement in symptoms for improved quality of life. Baseline:  Goal status: INITIAL  2.  Patient will improve LEFS score by at least 18 points in order to indicate improved tolerance to activity. Baseline:  Goal status: INITIAL  3.  Patient will get back to walking 1-1.5 miles for improved ability to ambulate in [place] .  Baseline:  Goal status: INITIAL  4.  Patient will be able to complete 5x STS in under 11.4 seconds in order to demonstrate improved functional strength. Baseline:  Goal status: INITIAL  5.  Patient will demonstrate grade of 5/5 MMT grade in all tested musculature as evidence of improved strength to assist with stair ambulation and gait.    Baseline:  Goal status: INITIAL     PLAN:  PT FREQUENCY: 1-2x/week  PT DURATION: 12 weeks  PLANNED INTERVENTIONS: [id number]- PT Re-evaluation, 97110-Therapeutic exercises, 97530- Therapeutic activity, [id number]- Neuromuscular re-education, 97535- Self Care, [id number]- Manual therapy, [id number]- Gait training, [phone]- Orthotic Fit/training, [phone]- Canalith repositioning, [id number]- Aquatic Therapy, [id number]- Splinting, [id number]- Wound care (first 20  sq cm), 97598- Wound care (each additional 20 sq cm)Patient/Family education, Balance training, Stair training, Taping, Dry Needling, Joint mobilization, Joint manipulation, Spinal manipulation, Spinal mobilization, Scar mobilization, and DME instructions.  PLAN FOR NEXT SESSION: f/u with HEP, glute and LE strengthening, functional strength, cardio/endurance training, build HEP/gym routine  Consider warming up on tred mill on an incline with pt leaned forward UE on rails - can help alleviate sx of stenosis.    Adi Seales O'Shea, Student-PT, DPT 06/21/2024, 12:17 PM  "

## 2024-06-22 ENCOUNTER — Encounter (HOSPITAL_BASED_OUTPATIENT_CLINIC_OR_DEPARTMENT_OTHER): Payer: Self-pay | Admitting: Physical Therapy

## 2024-06-22 LAB — ALLERGEN PROFILE, PERENNIAL ALLERGEN IGE

## 2024-06-27 ENCOUNTER — Ambulatory Visit (HOSPITAL_BASED_OUTPATIENT_CLINIC_OR_DEPARTMENT_OTHER): Admitting: Physical Therapy

## 2024-06-27 ENCOUNTER — Encounter (HOSPITAL_BASED_OUTPATIENT_CLINIC_OR_DEPARTMENT_OTHER): Payer: Self-pay | Admitting: Physical Therapy

## 2024-06-27 DIAGNOSIS — M6281 Muscle weakness (generalized): Secondary | ICD-10-CM

## 2024-06-27 DIAGNOSIS — M5416 Radiculopathy, lumbar region: Secondary | ICD-10-CM | POA: Diagnosis not present

## 2024-06-27 DIAGNOSIS — R2689 Other abnormalities of gait and mobility: Secondary | ICD-10-CM

## 2024-06-27 DIAGNOSIS — M5459 Other low back pain: Secondary | ICD-10-CM

## 2024-06-27 NOTE — Patient Instructions (Signed)

## 2024-06-27 NOTE — Therapy (Cosign Needed)
 " OUTPATIENT PHYSICAL THERAPY THORACOLUMBAR EVALUATION   Patient Name: Blake Carter MRN: [id number] DOB:Aug 20, 1950, 74 y.o., male Today's Date: 06/27/2024  END OF SESSION:  PT End of Session - 06/27/24 1205     Visit Number 3    Number of Visits 24    Date for Recertification  09/06/24    Authorization Type [employer] medicare    PT Start Time 1149    PT Stop Time 1230    PT Time Calculation (min) 41 min    Activity Tolerance Patient tolerated treatment well    Behavior During Therapy WFL for tasks assessed/performed           Past Medical History:  Diagnosis Date   Allergic rhinitis    Anemia associated with cervical cancer treated with erythropoietin (HCC)    Aortic atherosclerosis    Arthritis    a little bit in some of my joints (06/20/2014)   Ascending aorta dilatation    4.1cm ascending aorta by CT 11/2023   Chronic diastolic CHF (congestive heart failure) (HCC)    Coronary artery disease    a. 05/2014 Cath/PCI: LM nl, LAD 40p, 60-70d (FFR 0.80->3.0x24 Synergy DES), D2 40, LCX 90d (2.25x12 Synergy DES), RCA 20-63m, EF 60-65%. b. Cath 02/2017 without acute change, elev LVEDP suggestive of dCHF.   Depression    Dilated aortic root    aortic root at 44mm by Chest CTA 11/2019   ED (erectile dysfunction)    Environmental allergies    dry cough   Fatty liver    GERD (gastroesophageal reflux disease)    History of hiatal hernia    Hydronephrosis of right kidney    a. s/p ureteral stenting in the past.   Hyperlipidemia    under control   Hypertension    Hypothyroidism    OCD (obsessive compulsive disorder)    Pacemaker    PAF (paroxysmal atrial fibrillation) (HCC)    Panic disorder    PAT (paroxysmal atrial tachycardia)    Pneumonia    PONV (postoperative nausea and vomiting)    after thyroid  surgery no problems in 10 years    Prostate cancer (HCC)    prostate cancer- robotic prostatectomy - followed by radiation because of positive lymph node--and pt on lupron   injections   PVC's (premature ventricular contractions)    Sinus bradycardia    a. chronic, asymptomatic.  24 hour Holter 10/2016 showed Sinus bradycardia, sinus arrhythmia and normal sinus rhythm with average heart rate 50bpm and heart rate ranged from 32 to 100bpm.   SUI (stress urinary incontinence), male    S/P ROBOTIC PROSTATECTOMY AND RADIATION TX   Past Surgical History:  Procedure Laterality Date   CHOLECYSTECTOMY N/A 04/11/2024   Procedure: LAPAROSCOPIC CHOLECYSTECTOMY;  Surgeon: Ann Fine, MD;  Location: [facility];  Service: General;  Laterality: N/A;   COLONOSCOPY  07/29/2013   Dr. Celestia   CORONARY ANGIOPLASTY WITH STENT PLACEMENT  06/20/2014   2   CORONARY PRESSURE/FFR STUDY N/A 07/08/2020   Procedure: INTRAVASCULAR PRESSURE WIRE/FFR STUDY;  Surgeon: Claudene Victory ORN, MD;  Location: [facility];  Service: Cardiovascular;  Laterality: N/A;   CYSTOSCOPY  11/30/2011   Procedure: CYSTOSCOPY;  Surgeon: Glendia DELENA Elizabeth, MD;  Location: [facility];  Service: Urology;  Laterality: N/A;  Inplantation of Artificial Sphincter and Cystoscopy   CYSTOSCOPY WITH RETROGRADE PYELOGRAM, URETEROSCOPY AND STENT PLACEMENT Right 02/11/2014   Procedure: CYSTOSCOPY WITH RETROGRADE PYELOGRAM, URETEROSCOPY ,URETERAL BIOPSY AND STENT PLACEMENT;  Surgeon: Gretel Ferrara, MD;  Location:  [facility];  Service: Urology;  Laterality: Right;   CYSTOSCOPY WITH RETROGRADE PYELOGRAM, URETEROSCOPY AND STENT PLACEMENT Right 04/15/2014   Procedure: CYSTOSCOPY WITH RETROGRADE PYELOGRAM, URETEROSCOPY, BIOPSY AND STENT PLACEMENT;  Surgeon: Gretel Ferrara, MD;  Location: [facility];  Service: Urology;  Laterality: Right;   LEFT HEART CATH AND CORONARY ANGIOGRAPHY N/A 03/29/2017   Procedure: LEFT HEART CATH AND CORONARY ANGIOGRAPHY;  Surgeon: Anner Alm ORN, MD;  Location: [facility]IVE CV [facility];  Service: Cardiovascular;  Laterality: N/A;   LEFT HEART CATHETERIZATION WITH CORONARY ANGIOGRAM N/A 06/20/2014   Procedure: LEFT  HEART CATHETERIZATION WITH CORONARY ANGIOGRAM;  Surgeon: Alm ORN Anner, MD;  Location: [facility] CATH LAB;  Service: Cardiovascular;  Laterality: N/A;   PACEMAKER IMPLANT N/A 05/21/2019   Procedure: PACEMAKER IMPLANT;  Surgeon: Waddell Danelle ORN, MD;  Location: [facility];  Service: Cardiovascular;  Laterality: N/A;   PERCUTANEOUS CORONARY STENT INTERVENTION (PCI-S)  06/20/2014   Procedure: PERCUTANEOUS CORONARY STENT INTERVENTION (PCI-S);  Surgeon: Alm ORN Anner, MD;  Location: [facility] CATH LAB;  Service: Cardiovascular;;  LAD and Circumflex   REFRACTIVE SURGERY Bilateral 05/31/2002   RIGHT/LEFT HEART CATH AND CORONARY ANGIOGRAPHY N/A 07/08/2020   Procedure: RIGHT/LEFT HEART CATH AND CORONARY ANGIOGRAPHY;  Surgeon: Claudene Victory ORN, MD;  Location: [facility];  Service: Cardiovascular;  Laterality: N/A;   ROBOT ASSISTED LAPAROSCOPIC RADICAL PROSTATECTOMY  05/31/2010   THYROIDECTOMY, PARTIAL  10/30/1974   TONSILLECTOMY  05/31/1954   TOTAL THYROIDECTOMY  10/29/2012   nuked it   UPPER GI ENDOSCOPY     food impaction 2009 done by Dr Celestia   UPPER GI ENDOSCOPY     URINARY SPHINCTER IMPLANT  11/30/2011   Procedure: ARTIFICIAL URINARY SPHINCTER;  Surgeon: Glendia DELENA Elizabeth, MD;  Location: [facility];  Service: Urology;  Laterality: N/A;   Patient Active Problem List   Diagnosis Date Noted   Obsessive-compulsive disorder 11/18/2023   Normocytic anemia 11/18/2023   Morbid obesity (HCC) 11/18/2023   Hypertensive retinopathy 11/18/2023   Mixed hyperlipidemia 11/18/2023   Dilatation of aorta 11/18/2023   Paroxysmal atrial fibrillation (HCC) 11/18/2023   Primary open angle glaucoma (POAG) of both eyes 11/18/2023   Reactive airway disease 11/18/2023   Thoracic aortic aneurysm without rupture 11/18/2023   Thrombophilia 11/18/2023   Vitamin D deficiency 11/18/2023   Aortic atherosclerosis    Abnormal findings on diagnostic imaging of lung 01/30/2020   Malignant neoplasm metastatic to bone  (HCC) 09/27/2019   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) 09/27/2019   Pacemaker 08/20/2019   Sinus node dysfunction (HCC) 05/21/2019   Palpitations 05/07/2019   Bradycardia 01/30/2019   Globus pharyngeus 01/04/2018   Hoarseness 01/04/2018   SOB (shortness of breath) 12/13/2017   Chronic diastolic heart failure (HCC) 06/26/2017   Coronary artery disease due to lipid rich plaque    Pure hypercholesterolemia    Angina pectoris 07/04/2014   Family history of premature CAD 06/20/2014   Dyspnea on exertion 06/20/2014    Class: Diagnosis of   Hydronephrosis of right kidney    Prostate cancer (HCC)    Dilated aortic root 12/17/2013   Hypothyroidism following radioiodine therapy 03/05/2013   Essential hypertension 11/08/2012   Allergic rhinitis 11/08/2012   Depressive disorder, not elsewhere classified 11/08/2012   GERD (gastroesophageal reflux disease) 11/08/2012   Insomnia 11/08/2012    PCP: Okey Carlin Redbird, MD   REFERRING PROVIDER: Cesario Boer, MD  REFERRING DIAG: M54.16 (ICD-10-CM) - Radiculopathy, lumbar region  Rationale for Evaluation and Treatment: Rehabilitation  THERAPY  DIAG:  Other low back pain  Muscle weakness (generalized)  Other abnormalities of gait and mobility  ONSET DATE: 1 year+  SUBJECTIVE:                                                                                                                                                                                           SUBJECTIVE STATEMENT: Pt is coming in with a horrible sinus infection and is going to an appointment after. Hasn't been able to do exercises. R knee is feeling less numbness and moreso an electrical surge through the knee. Patient says he has pain in his feet under his arches - feels like a lincoln log under his foot.  Eval: Patient states numbness in R leg and weakness in the leg now. Symptoms are from thigh to mid/lower shin. No change with spinal injections, lyrica doesn't help.  Intermittent pain but not constant  but numbness is constant. Hasn't felt normal in months. Has some light touch feeling. Member at [employer].  PERTINENT HISTORY:  CHF, CAD, HTN, HLD, pacemaker, afib, hx prostate cancer with METS  PAIN:  Are you having pain? Yes: NPRS scale: 5-7/10 Pain location: R thigh to shin Pain description: numb Aggravating factors: constant Relieving factors:    PRECAUTIONS: None and Other: light lifting  WEIGHT BEARING RESTRICTIONS: No  FALLS:  Has patient fallen in last 6 months? Yes. Number of falls 3-4  OCCUPATION: Retired   PLOF: Independent  PATIENT GOALS: strengthen R knee and get cardio back up, going to [place]  in May and would like to get back to walking 1-1.5 miles  OBJECTIVE: (objective measures from initial evaluation unless otherwise dated)  PATIENT SURVEYS:  LEFS  Extreme difficulty/unable (0), Quite a bit of difficulty (1), Moderate difficulty (2), Little difficulty (3), No difficulty (4) Survey date:  06/13/24  Any of your usual work, housework or school activities 2  2. Usual hobbies, recreational or sporting activities 2  3. Getting into/out of the bath 4  4. Walking between rooms 4  5. Putting on socks/shoes 3  6. Squatting  3  7. Lifting an object, like a bag of groceries from the floor 4  8. Performing light activities around your home 3  9. Performing heavy activities around your home 1  10. Getting into/out of a car 2  11. Walking 2 blocks 1  12. Walking 1 mile 0  13. Going up/down 10 stairs (1 flight) 1  14. Standing for 1 hour 2  15.  sitting for 1 hour 4  16. Running on even ground 0  17. Running on uneven ground 0  18. Making sharp  turns while running fast 0  19. Hopping  2  20. Rolling over in bed 4  Score total:  42/80     SCREENING FOR RED FLAGS: Bowel or bladder incontinence: No Spinal tumors: No Cauda equina syndrome: No Compression fracture: No Abdominal aneurysm: No  COGNITION: Overall cognitive  status: Within functional limits for tasks assessed     SENSATION: Impaired L3-L4 on RLE   POSTURE: decreased lumbar lordosis and increased thoracic kyphosis  PALPATION: No tenderness grossly  LUMBAR ROM:   AROM eval  Flexion 25% limited  Extension 25% limited (felt good)  Right lateral flexion 0% limited  Left lateral flexion 0% limited  Right rotation 0% limited  Left rotation 0% limited   (Blank rows = not tested) * = pain/symptoms  LOWER EXTREMITY ROM:   WFL for tasks assessed  Active  Right eval Left eval  Hip flexion    Hip extension    Hip abduction    Hip adduction    Hip internal rotation    Hip external rotation    Knee flexion    Knee extension    Ankle dorsiflexion    Ankle plantarflexion    Ankle inversion    Ankle eversion     (Blank rows = not tested) * = pain/symptoms  LOWER EXTREMITY MMT:    MMT Right eval Left eval  Hip flexion 4- 5  Hip extension 3- 3+  Hip abduction 4- 4  Hip adduction    Hip internal rotation    Hip external rotation    Knee flexion 4 5  Knee extension 4 5  Ankle dorsiflexion 4+ 5  Ankle plantarflexion    Ankle inversion    Ankle eversion     (Blank rows = not tested) * = pain/symptoms  FUNCTIONAL TESTS:  5 times sit to stand: 16.18 seconds without UE support, relies LLE>RLE Stairs: 7 inch, tends to use step to pattern, can perform alternating pattern with cueing, increased effort on R stance ascending, decreased eccentric strength on R Femoral nerve tension test: no change in symptoms Repeated lumbar extension: no change in symptoms  GAIT: Distance walked: 100 feet Assistive device utilized: None Level of assistance: Complete Independence Comments:   TODAY'S TREATMENT:                                                                                                                              DATE:  1/28 There-ex: Nustep 5 mins  Supine bridge 2x10   Manual:  Skilled TP palpation  DN to paraspinals   Grade 3&4 distraction bilat   Trigger Point Dry Needling  Initial Treatment: Pt instructed on Dry Needling rational, procedures, and possible side effects. Pt instructed to expect mild to moderate muscle soreness later in the day and/or into the next day.  Pt instructed in methods to reduce muscle soreness. Pt instructed to continue prescribed HEP. Because Dry Needling was performed over or adjacent to a lung  field, pt was educated on S/S of pneumothorax and to seek immediate medical attention should they occur.  Patient was educated on signs and symptoms of infection and other risk factors and advised to seek medical attention should they occur.  Patient verbalized understanding of these instructions and education.   Patient Verbal Consent Given: Yes Education Handout Provided: Yes Muscles Treated: lumbar paraspinals right from L3-L5, right gluteal 3x with.75x50 needle  Electrical Stimulation Performed: No Treatment Response/Outcome: great twitch     1/22 Manual:  STM to lower back/paraspinals and upper glutes Grade 3&4 distraction bilat  There-ex: Supine bridge 3x10  Supine abd 3x10 GTB  Supine SLR x8 bilat  Pt ed: TrA activation and postural breathing PT provided ed on how loosening up muscles in the back may help with the numbness and tingling if nerve is getting compressed.  06/13/24  Eval, HEP, education   PATIENT EDUCATION:  Education details: Patient educated on exam findings, POC, scope of PT, HEP, relevant anatomy and biomechanics, dosing of exercises. Person educated: Patient Education method: Explanation, Demonstration, and Handouts Education comprehension: verbalized understanding, returned demonstration, verbal cues required, and tactile cues required  HOME EXERCISE PROGRAM: Access Code: [email or web address] URL: [email or web address] Date: 06/13/2024 Prepared by: Prentice Zaunegger  Exercises - Standing Lumbar Extension  - 2-3 x daily - 7 x weekly -  1-2 sets - 10 reps - Supine Bridge  - 1 x daily - 7 x weekly - 2 sets - 10 reps - Seated Long Arc Quad  - 1 x daily - 7 x weekly - 2-3 sets - 10 reps - 5-10 second holds hold  ASSESSMENT:  CLINICAL IMPRESSION: Pt came in today stating that the numbness in their leg has shifted into a short electrical current like pain. Pt received DN for TP in back and stated it was the first time he had felt something in his leg in a while. Pt tolerated DN well. Pt requested to do supine bridges for there-ex and tolerated them well with minimal cueing for abdominal breathing.   Eval: Patient a 74 y.o. y.o. male who was seen today for physical therapy evaluation and treatment for lumbar radiculopathy. Patient presents with pain limited deficits in lumbar spine and LE strength, ROM, endurance, activity tolerance, and functional mobility with ADL. Patient is having to modify and restrict ADL as indicated by outcome measure score as well as subjective information and objective measures which is affecting overall participation. Patient will benefit from skilled physical therapy in order to improve function and reduce impairment.  OBJECTIVE IMPAIRMENTS: Abnormal gait, decreased activity tolerance, decreased balance, decreased endurance, decreased mobility, difficulty walking, decreased ROM, decreased strength, hypomobility, increased muscle spasms, impaired flexibility, improper body mechanics, postural dysfunction, and pain  ACTIVITY LIMITATIONS: carrying, lifting, bending, standing, squatting, stairs, transfers, bed mobility, reach over head, locomotion level, and caring for others  PARTICIPATION LIMITATIONS:  meal prep, cleaning, laundry, shopping, community activity, and yard work  PERSONAL FACTORS: Fitness, Time since onset of injury/illness/exacerbation, and 3+ comorbidities: CHF, CAD, HTN, HLD, afib, hx prostate cancer  are also affecting patient's functional outcome.   REHAB POTENTIAL: Good  CLINICAL DECISION  MAKING: Evolving/moderate complexity  EVALUATION COMPLEXITY: Moderate   GOALS: Goals reviewed with patient? Yes  SHORT TERM GOALS: Target date: 07/25/2024    Patient will be independent with HEP in order to improve functional outcomes. Baseline:  Goal status: INITIAL  2.  Patient will report at least 25% improvement in symptoms for improved quality of life. Baseline:  Goal status:  INITIAL    LONG TERM GOALS: Target date: 09/05/2024    Patient will report at least 75% improvement in symptoms for improved quality of life. Baseline:  Goal status: INITIAL  2.  Patient will improve LEFS score by at least 18 points in order to indicate improved tolerance to activity. Baseline:  Goal status: INITIAL  3.  Patient will get back to walking 1-1.5 miles for improved ability to ambulate in [place] .  Baseline:  Goal status: INITIAL  4.  Patient will be able to complete 5x STS in under 11.4 seconds in order to demonstrate improved functional strength. Baseline:  Goal status: INITIAL  5.  Patient will demonstrate grade of 5/5 MMT grade in all tested musculature as evidence of improved strength to assist with stair ambulation and gait.   Baseline:  Goal status: INITIAL     PLAN:  PT FREQUENCY: 1-2x/week  PT DURATION: 12 weeks  PLANNED INTERVENTIONS: [id number]- PT Re-evaluation, 97110-Therapeutic exercises, 97530- Therapeutic activity, [id number]- Neuromuscular re-education, 97535- Self Care, [id number]- Manual therapy, [id number]- Gait training, [phone]- Orthotic Fit/training, [phone]- Canalith repositioning, [id number]- Aquatic Therapy, [phone]- Splinting, [phone]- Wound care (first 20 sq cm), 97598- Wound care (each additional 20 sq cm)Patient/Family education, Balance training, Stair training, Taping, Dry Needling, Joint mobilization, Joint manipulation, Spinal manipulation, Spinal mobilization, Scar mobilization, and DME instructions.  PLAN FOR NEXT SESSION: f/u with HEP, glute and LE strengthening, functional  strength, cardio/endurance training, build HEP/gym routine  Consider warming up on tred mill on an incline with pt leaned forward UE on rails - can help alleviate sx of stenosis.    Alm JINNY Don, PT, DPT 06/27/2024, 3:56 PM  "

## 2024-07-02 ENCOUNTER — Ambulatory Visit (HOSPITAL_COMMUNITY)
Admission: RE | Admit: 2024-07-02 | Discharge: 2024-07-02 | Disposition: A | Source: Ambulatory Visit | Attending: Cardiology | Admitting: Cardiology

## 2024-07-02 ENCOUNTER — Ambulatory Visit: Payer: Self-pay | Admitting: Cardiology

## 2024-07-02 ENCOUNTER — Encounter: Payer: Self-pay | Admitting: Cardiology

## 2024-07-02 DIAGNOSIS — I77810 Thoracic aortic ectasia: Secondary | ICD-10-CM

## 2024-07-02 DIAGNOSIS — Z79899 Other long term (current) drug therapy: Secondary | ICD-10-CM

## 2024-07-02 MED ORDER — IOHEXOL 350 MG/ML SOLN
75.0000 mL | Freq: Once | INTRAVENOUS | Status: AC | PRN
  Administered 2024-07-02: 75 mL via INTRAVENOUS

## 2024-07-03 ENCOUNTER — Ambulatory Visit (HOSPITAL_BASED_OUTPATIENT_CLINIC_OR_DEPARTMENT_OTHER): Admitting: Physical Therapy

## 2024-07-03 ENCOUNTER — Encounter (HOSPITAL_BASED_OUTPATIENT_CLINIC_OR_DEPARTMENT_OTHER): Payer: Self-pay | Admitting: Physical Therapy

## 2024-07-03 DIAGNOSIS — M5459 Other low back pain: Secondary | ICD-10-CM

## 2024-07-03 DIAGNOSIS — R2689 Other abnormalities of gait and mobility: Secondary | ICD-10-CM

## 2024-07-03 DIAGNOSIS — M6281 Muscle weakness (generalized): Secondary | ICD-10-CM

## 2024-07-03 DIAGNOSIS — R29898 Other symptoms and signs involving the musculoskeletal system: Secondary | ICD-10-CM

## 2024-07-03 MED ORDER — CHLORTHALIDONE 25 MG PO TABS: 25.0000 mg | ORAL_TABLET | Freq: Every day | ORAL | 3 refills | Status: AC

## 2024-07-03 NOTE — Addendum Note (Signed)
 Addended by: BRIEN SALM on: 07/03/2024 10:21 AM   Modules accepted: Orders

## 2024-07-03 NOTE — Addendum Note (Signed)
 Addended by: BRIEN SALM on: 07/03/2024 10:43 AM   Modules accepted: Orders

## 2024-07-04 ENCOUNTER — Encounter: Payer: Self-pay | Admitting: Adult Health

## 2024-07-04 ENCOUNTER — Ambulatory Visit: Admitting: Adult Health

## 2024-07-04 DIAGNOSIS — F411 Generalized anxiety disorder: Secondary | ICD-10-CM

## 2024-07-04 DIAGNOSIS — F331 Major depressive disorder, recurrent, moderate: Secondary | ICD-10-CM

## 2024-07-04 DIAGNOSIS — F429 Obsessive-compulsive disorder, unspecified: Secondary | ICD-10-CM

## 2024-07-04 MED ORDER — FLUVOXAMINE MALEATE 100 MG PO TABS: ORAL_TABLET | ORAL | 1 refills | Status: AC

## 2024-07-04 NOTE — Progress Notes (Signed)
 [name] [id number] 1950-07-25 74 y.o.  Virtual Visit via Telephone Note  I connected with pt on 07/04/24 at 11:30 AM EST by telephone and verified that I am speaking with the correct person using two identifiers.   I discussed the limitations, risks, security and privacy concerns of performing an evaluation and management service by telephone and the availability of in person appointments. I also discussed with the patient that there may be a patient responsible charge related to this service. The patient expressed understanding and agreed to proceed.   I discussed the assessment and treatment plan with the patient. The patient was provided an opportunity to ask questions and all were answered. The patient agreed with the plan and demonstrated an understanding of the instructions.   The patient was advised to call back or seek an in-person evaluation if the symptoms worsen or if the condition fails to improve as anticipated.  I provided 15 minutes of non-face-to-face time during this encounter.  The patient was located at home.  The provider was located at [facility].   Blake LOISE Sayers, NP   Subjective:   Patient ID:  Blake Carter is a 74 y.o. (DOB Apr 22, 1951) male.  Chief Complaint: No chief complaint on file.   HPI Filipe Greathouse presents for follow-up of MDD, GAD and OCD.  Describes mood today as ok. Pleasant. Denies tearfulness. Mood symptoms - denies depression, anxiety and irritability. Reports stable interest and motivation. Denies panic attacks. Denies worry, over thinking and rumination. Reports frustration with long term illness - prostate cancer. Reports mood is stable. Stating I think I'm doing alright. Feels like the Luvox  continues to work well for him. Taking medications as prescribed. Energy levels stable. Active, member at [employer] - also involved in PT.   Enjoys some usual interests and activities. Married. Lives with wife. Has 2 grown  children. Spending time with family. Appetite adequate. Weight stable - 268 pounds - working with endocrinology. Sleeps well most nights. Averages 8 to 10 hours.  Focus and concentration stable. Completing tasks. Managing aspects of household. Retired pensions consultant and professor.  Denies SI or HI.  Denies AH or VH. Denies self harm. Denies substance use.  Review of Systems:  Review of Systems  Musculoskeletal:  Negative for gait problem.  Neurological:  Negative for tremors.  Psychiatric/Behavioral:         Please refer to HPI    Medications: I have reviewed the patient's current medications.  Current Outpatient Medications  Medication Sig Dispense Refill   acetaminophen  (TYLENOL ) 500 MG tablet Take 1,000 mg by mouth every 6 (six) hours as needed for moderate pain (pain score 4-6).     ALPRAZolam  (XANAX ) 0.25 MG tablet Take 1 tablet (0.25 mg total) by mouth 2 (two) times daily as needed for anxiety. (Patient taking differently: Take 0.25 mg by mouth daily as needed (turbulence on flights).) 60 tablet 2   amLODipine -olmesartan  (AZOR ) 10-40 MG tablet Take 1 tablet by mouth daily. 90 tablet 3   apixaban  (ELIQUIS ) 5 MG TABS tablet Take 1 tablet (5 mg total) by mouth 2 (two) times daily. 180 tablet 3   azelastine  (ASTELIN ) 0.1 % nasal spray Place 2 sprays into both nostrils 2 (two) times daily. Use in each nostril as directed 30 mL 12   chlorthalidone  (HYGROTON ) 25 MG tablet Take 1 tablet (25 mg total) by mouth daily. 90 tablet 3   darolutamide (NUBEQA) 300 MG tablet Take 300 mg by mouth 2 (two) times daily with a meal.  diazepam  (VALIUM ) 5 MG tablet TAKE 1 TABLET BY MOUTH DAILY AS NEEDED FOR ANXIETY. (Patient taking differently: Take 5 mg by mouth at bedtime as needed (sleep).) 30 tablet 3   fenofibrate  (TRICOR ) 145 MG tablet TAKE 1 TABLET BY MOUTH EVERY DAY 90 tablet 1   fluvoxaMINE  (LUVOX ) 100 MG tablet TAKE 3 TABLETS (300 MG TOTAL) DAILY. (Patient taking differently: Take 200 mg by mouth.)  270 tablet 1   GEMTESA 75 MG TABS Take 75 mg by mouth daily.     latanoprost (XALATAN) 0.005 % ophthalmic solution Place 1 drop into both eyes at bedtime.      leuprolide  (LUPRON  DEPOT, 43-MONTH,) 30 MG injection Inject 30 mg into the muscle every 6 (six) months.     levothyroxine  (SYNTHROID ) 150 MCG tablet Take 150 mcg by mouth daily before breakfast.     metFORMIN (GLUCOPHAGE-XR) 500 MG 24 hr tablet Take 500 mg by mouth 2 (two) times daily with a meal.     metoprolol  succinate (TOPROL -XL) 100 MG 24 hr tablet TAKE 1 TABLET BY MOUTH DAILY. TAKE WITH OR IMMEDIATELY FOLLOWING A MEAL. 90 tablet 1   montelukast (SINGULAIR) 10 MG tablet Take 10 mg by mouth at bedtime.     nitroGLYCERIN  (NITROSTAT ) 0.4 MG SL tablet PLACE 1 TABLET UNDER THE TONGUE EVERY 5 MINUTES AS NEEDED FOR CHEST PAIN. (Patient not taking: Reported on 06/19/2024) 25 tablet 7   oxyCODONE  (ROXICODONE ) 5 MG immediate release tablet Take 1 tablet (5 mg total) by mouth every 4 (four) hours as needed. 15 tablet 0   pantoprazole  (PROTONIX ) 40 MG tablet TAKE 2 TABLETS BY MOUTH EVERY DAY (Patient taking differently: Take 40 mg by mouth daily.) 180 tablet 2   pregabalin (LYRICA) 100 MG capsule Take 100 mg by mouth 2 (two) times daily.     pseudoephedrine-guaifenesin (MUCINEX D) 60-600 MG 12 hr tablet Take 1 tablet by mouth every 12 (twelve) hours.     rosuvastatin  (CRESTOR ) 40 MG tablet TAKE 1 TABLET BY MOUTH EVERY DAY 90 tablet 3   No current facility-administered medications for this visit.    Medication Side Effects: None  Allergies: Allergies[1]  Past Medical History:  Diagnosis Date   Allergic rhinitis    Anemia associated with cervical cancer treated with erythropoietin (HCC)    Aortic atherosclerosis    Arthritis    a little bit in some of my joints (06/20/2014)   Ascending aorta dilatation    4.1cm ascending aorta by CT 11/2023   Chronic diastolic CHF (congestive heart failure) (HCC)    Coronary artery disease    a. 05/2014  Cath/PCI: LM nl, LAD 40p, 60-70d (FFR 0.80->3.0x24 Synergy DES), D2 40, LCX 90d (2.25x12 Synergy DES), RCA 20-78m, EF 60-65%. b. Cath 02/2017 without acute change, elev LVEDP suggestive of dCHF.   Depression    Dilated aortic root    aortic root at 44mm by Chest CTA 11/2019   ED (erectile dysfunction)    Environmental allergies    dry cough   Fatty liver    GERD (gastroesophageal reflux disease)    History of hiatal hernia    Hydronephrosis of right kidney    a. s/p ureteral stenting in the past.   Hyperlipidemia    under control   Hypertension    Hypothyroidism    OCD (obsessive compulsive disorder)    Pacemaker    PAF (paroxysmal atrial fibrillation) (HCC)    Panic disorder    PAT (paroxysmal atrial tachycardia)    Pneumonia  PONV (postoperative nausea and vomiting)    after thyroid  surgery no problems in 10 years    Prostate cancer [facility])    prostate cancer- robotic prostatectomy - followed by radiation because of positive lymph node--and pt on lupron  injections   PVC's (premature ventricular contractions)    Sinus bradycardia    a. chronic, asymptomatic.  24 hour Holter 10/2016 showed Sinus bradycardia, sinus arrhythmia and normal sinus rhythm with average heart rate 50bpm and heart rate ranged from 32 to 100bpm.   SUI (stress urinary incontinence), male    S/P ROBOTIC PROSTATECTOMY AND RADIATION TX    Family History  Problem Relation Age of Onset   Cancer Father    Hyperlipidemia Father    Heart disease Father        Father had CABG/aorta repair by the time he was in his 74s   CAD Brother        Brother who is 10 years younger has had some sort of stenting    Social History   Socioeconomic History   Marital status: Married    Spouse name: Not on file   Number of children: Not on file   Years of education: Not on file   Highest education level: Not on file  Occupational History   Not on file  Tobacco Use   Smoking status: Never   Smokeless tobacco: Never   Vaping Use   Vaping status: Never Used  Substance and Sexual Activity   Alcohol use: Yes    Comment: only on vacation   Drug use: No   Sexual activity: Yes  Other Topics Concern   Not on file  Social History Narrative   Not on file   Social Drivers of Health   Tobacco Use: Low Risk (07/03/2024)   Patient History    Smoking Tobacco Use: Never    Smokeless Tobacco Use: Never    Passive Exposure: Not on file  Financial Resource Strain: Not on file  Food Insecurity: No Food Insecurity (11/29/2022)   Hunger Vital Sign    Worried About Running Out of Food in the Last Year: Never true    Ran Out of Food in the Last Year: Never true  Transportation Needs: No Transportation Needs (11/29/2022)   PRAPARE - Administrator, Civil Service (Medical): No    Lack of Transportation (Non-Medical): No  Physical Activity: Not on file  Stress: Not on file  Social Connections: Not on file  Intimate Partner Violence: Not At Risk (11/29/2022)   Humiliation, Afraid, Rape, and Kick questionnaire    Fear of Current or Ex-Partner: No    Emotionally Abused: No    Physically Abused: No    Sexually Abused: No  Depression (PHQ2-9): Low Risk (11/29/2022)   Depression (PHQ2-9)    PHQ-2 Score: 0  Alcohol Screen: Low Risk (11/29/2022)   Alcohol Screen    Last Alcohol Screening Score (AUDIT): 4  Housing: Unknown (02/07/2024)   [facility]   Epic    At any time in the past 12 months, were you homeless or living in a shelter (including now)?: No    Number of Times Moved in the Last Year: Not on file    Unable to Pay for Housing in the Last Year: Not on file  Utilities: Not At Risk (11/29/2022)   [facility] Utilities    Threatened with loss of utilities: No  Health Literacy: Not on file    Past Medical History, Surgical history,  Social history, and Family history were reviewed and updated as appropriate.   Please see review of systems for further details on the patient's review  from today.   Objective:   Physical Exam:  There were no vitals taken for this visit.  Physical Exam Constitutional:      General: He is not in acute distress. Musculoskeletal:        General: No deformity.  Neurological:     Mental Status: He is alert and oriented to person, place, and time.     Coordination: Coordination normal.  Psychiatric:        Attention and Perception: Attention and perception normal. He does not perceive auditory or visual hallucinations.        Mood and Affect: Mood normal. Mood is not anxious or depressed. Affect is not labile, blunt, angry or inappropriate.        Speech: Speech normal.        Behavior: Behavior normal.        Thought Content: Thought content normal. Thought content is not paranoid or delusional. Thought content does not include homicidal or suicidal ideation. Thought content does not include homicidal or suicidal plan.        Cognition and Memory: Cognition and memory normal.        Judgment: Judgment normal.     Comments: Insight intact     Lab Review:     Component Value Date/Time   NA 142 06/18/2024 1450   K 4.3 06/18/2024 1450   CL 104 06/18/2024 1450   CO2 21 06/18/2024 1450   GLUCOSE 101 (H) 06/18/2024 1450   GLUCOSE 104 (H) 04/04/2024 1328   BUN 35 (H) 06/18/2024 1450   CREATININE 1.20 06/18/2024 1450   CREATININE 0.92 10/31/2018 1247   CREATININE 1.07 10/14/2015 0850   CALCIUM  9.4 06/18/2024 1450   PROT 7.6 04/04/2024 1328   PROT 7.0 01/07/2023 0841   ALBUMIN 4.4 04/04/2024 1328   ALBUMIN 4.5 01/07/2023 0841   AST 37 04/04/2024 1328   AST 19 10/31/2018 1247   ALT 35 04/04/2024 1328   ALT 25 10/31/2018 1247   ALKPHOS 54 04/04/2024 1328   BILITOT 0.4 04/04/2024 1328   BILITOT 0.3 01/07/2023 0841   BILITOT 0.3 10/31/2018 1247   GFRNONAA >60 04/04/2024 1328   GFRNONAA >60 10/31/2018 1247   GFRAA 69 07/03/2020 1146   GFRAA >60 10/31/2018 1247       Component Value Date/Time   WBC 5.1 06/19/2024 1036   RBC  4.09 (L) 06/19/2024 1036   HGB 11.2 (L) 06/19/2024 1036   HGB 11.7 (L) 01/07/2023 0841   HCT 34.4 (L) 06/19/2024 1036   HCT 36.2 (L) 01/07/2023 0841   PLT 297.0 06/19/2024 1036   PLT 205 01/07/2023 0841   MCV 84.0 06/19/2024 1036   MCV 88 01/07/2023 0841   MCH 26.8 04/04/2024 1328   MCHC 32.6 06/19/2024 1036   RDW 15.9 (H) 06/19/2024 1036   RDW 13.8 01/07/2023 0841   LYMPHSABS 1.0 06/19/2024 1036   LYMPHSABS 0.5 (L) 01/07/2023 0841   MONOABS 0.5 06/19/2024 1036   EOSABS 0.1 06/19/2024 1036   EOSABS 0.1 01/07/2023 0841   BASOSABS 0.1 06/19/2024 1036   BASOSABS 0.0 01/07/2023 0841    No results found for: POCLITH, LITHIUM   No results found for: PHENYTOIN, PHENOBARB, VALPROATE, CBMZ   .res Assessment: Plan:    Plan:  PDMP reviewed  Luvox  300mg  daily Valium  5mg  daily - none needed today - will call  for refills  RTC 6 months  15 minutes spent dedicated to the care of this patient on the date of this encounter to include pre-visit review of records, ordering of medication, post visit documentation, and face-to-face time with the patient discussing depression, anxiety and OCD. Discussed continuing current medication regimen.  Patient advised to contact office with any questions, adverse effects, or acute worsening in signs and symptoms.  Discussed potential benefits, risk, and side effects of benzodiazepines to include potential risk of tolerance and dependence, as well as possible drowsiness. Advised patient not to drive if experiencing drowsiness and to take lowest possible effective dose to minimize risk of dependence and tolerance.  There are no diagnoses linked to this encounter.  Please see After Visit Summary for patient specific instructions.  Future Appointments  Date Time Provider Department Center  07/04/2024 11:30 AM Ilia Dimaano, Blake Mattocks, NP [facility] None  07/05/2024 12:30 PM Dow Alm PARAS, PT [facility] [address]  07/09/2024  4:00 PM Magda Josette BRAVO, PT [facility] [address]  07/11/2024  1:00 PM Magda Josette BRAVO, PT [facility] [address]  07/16/2024  4:00 PM Magda Josette BRAVO, PT [facility] [address]  07/18/2024 10:15 AM Magda Josette BRAVO, PT [facility] [address]  07/18/2024  2:00 PM Hope Almarie ORN, NP [facility] [address]  07/24/2024  1:45 PM Zaunegger, Prentice RAMAN, PT [facility] [address]  07/25/2024  9:00 AM [facility] PFT ROOM [facility] [address]  07/25/2024 10:00 AM Charley Conger, PA-C [facility] [address]  07/26/2024 11:00 AM Zaunegger, Prentice RAMAN, PT [facility] [address]  07/31/2024 11:45 AM Magda Josette BRAVO, PT [facility] [address]  08/02/2024 11:45 AM Zaunegger, Prentice RAMAN, PT [facility] [address]  08/07/2024 11:45 AM Magda Josette BRAVO, PT [facility] [address]  08/09/2024 11:45 AM Zaunegger, Prentice RAMAN, PT [facility] [address]  08/13/2024  7:05 AM [facility] HVT DEVICE REMOTES [facility] H&V  08/14/2024 11:45 AM Magda Josette BRAVO, PT [facility] [address]  08/16/2024  1:45 PM Zaunegger, Prentice RAMAN, PT [facility] [address]  08/21/2024 11:45 AM Magda Josette BRAVO, PT [facility] [address]  08/23/2024 11:45 AM Zaunegger, Prentice RAMAN, PT [facility] [address]  08/28/2024 11:45 AM Magda Josette BRAVO, PT [facility] [address]  08/30/2024 11:45 AM Zaunegger, Prentice RAMAN, PT [facility] [address]  09/04/2024 11:45 AM Magda Josette BRAVO, PT [facility] [address]  09/06/2024 11:45 AM Zaunegger, Prentice RAMAN, PT [facility] [address]  09/07/2024 11:30 AM Onita Duos, MD [facility] None  09/07/2024  3:10 PM Vannie Reche RAMAN, NP [facility] [address]  11/12/2024  7:05 AM CVD HVT DEVICE REMOTES [facility] H&V  02/11/2025  7:05 AM [facility] HVT DEVICE REMOTES [facility] H&V  05/13/2025  7:05 AM CVD HVT DEVICE REMOTES [facility] H&V    No orders of the defined types were placed in this encounter.     -------------------------------    [1]  Allergies Allergen Reactions   Cimetidine Other (See Comments)    Unknown - over 30 years ago (2021) - unsure what occurred it may have been hives or flushing Other reaction(s): flushed    Atorvastatin      Myalgias on 80mg  dose   Niacin And Related Other (See Comments)    Flushing

## 2024-07-05 ENCOUNTER — Encounter (HOSPITAL_BASED_OUTPATIENT_CLINIC_OR_DEPARTMENT_OTHER): Payer: Self-pay | Admitting: Physical Therapy

## 2024-07-05 ENCOUNTER — Ambulatory Visit (HOSPITAL_BASED_OUTPATIENT_CLINIC_OR_DEPARTMENT_OTHER): Admitting: Physical Therapy

## 2024-07-05 DIAGNOSIS — M6281 Muscle weakness (generalized): Secondary | ICD-10-CM

## 2024-07-05 DIAGNOSIS — R29898 Other symptoms and signs involving the musculoskeletal system: Secondary | ICD-10-CM

## 2024-07-05 DIAGNOSIS — M5459 Other low back pain: Secondary | ICD-10-CM

## 2024-07-05 NOTE — Therapy (Unsigned)
 " OUTPATIENT PHYSICAL THERAPY THORACOLUMBAR TREATMENT    Patient Name: Blake Carter MRN: [id number] DOB:February 06, 1951, 74 y.o., male Today's Date: 07/06/2024  END OF SESSION:  PT End of Session - 07/05/24 1207     Visit Number 5    Number of Visits 24    Date for Recertification  09/06/24    Authorization Type [employer]    PT Start Time 1207    PT Stop Time 1249    PT Time Calculation (min) 42 min    Activity Tolerance Patient tolerated treatment well    Behavior During Therapy [facility] for tasks assessed/performed             Past Medical History:  Diagnosis Date   Allergic rhinitis    Anemia associated with cervical cancer treated with erythropoietin (HCC)    Aortic atherosclerosis    Arthritis    a little bit in some of my joints (06/20/2014)   Ascending aorta dilatation    4.1cm ascending aorta by CT 11/2023   Chronic diastolic CHF (congestive heart failure) (HCC)    Coronary artery disease    a. 05/2014 Cath/PCI: LM nl, LAD 40p, 60-70d (FFR 0.80->3.0x24 Synergy DES), D2 40, LCX 90d (2.25x12 Synergy DES), RCA 20-56m, EF 60-65%. b. Cath 02/2017 without acute change, elev LVEDP suggestive of dCHF.   Depression    Dilated aortic root    aortic root at 44mm by Chest CTA 11/2019   ED (erectile dysfunction)    Environmental allergies    dry cough   Fatty liver    GERD (gastroesophageal reflux disease)    History of hiatal hernia    Hydronephrosis of right kidney    a. s/p ureteral stenting in the past.   Hyperlipidemia    under control   Hypertension    Hypothyroidism    OCD (obsessive compulsive disorder)    Pacemaker    PAF (paroxysmal atrial fibrillation) (HCC)    Panic disorder    PAT (paroxysmal atrial tachycardia)    Pneumonia    PONV (postoperative nausea and vomiting)    after thyroid  surgery no problems in 10 years    Prostate cancer (HCC)    prostate cancer- robotic prostatectomy - followed by radiation because of positive lymph node--and pt on  lupron  injections   PVC's (premature ventricular contractions)    Sinus bradycardia    a. chronic, asymptomatic.  24 hour Holter 10/2016 showed Sinus bradycardia, sinus arrhythmia and normal sinus rhythm with average heart rate 50bpm and heart rate ranged from 32 to 100bpm.   SUI (stress urinary incontinence), male    S/P ROBOTIC PROSTATECTOMY AND RADIATION TX   Past Surgical History:  Procedure Laterality Date   CHOLECYSTECTOMY N/A 04/11/2024   Procedure: LAPAROSCOPIC CHOLECYSTECTOMY;  Surgeon: Ann Fine, MD;  Location: [facility];  Service: General;  Laterality: N/A;   COLONOSCOPY  07/29/2013   Dr. Celestia   CORONARY ANGIOPLASTY WITH STENT PLACEMENT  06/20/2014   2   CORONARY PRESSURE/FFR STUDY N/A 07/08/2020   Procedure: INTRAVASCULAR PRESSURE WIRE/FFR STUDY;  Surgeon: Claudene Victory ORN, MD;  Location: [facility] CV [facility];  Service: Cardiovascular;  Laterality: N/A;   CYSTOSCOPY  11/30/2011   Procedure: CYSTOSCOPY;  Surgeon: Glendia DELENA Elizabeth, MD;  Location: [facility];  Service: Urology;  Laterality: N/A;  Inplantation of Artificial Sphincter and Cystoscopy   CYSTOSCOPY WITH RETROGRADE PYELOGRAM, URETEROSCOPY AND STENT PLACEMENT Right 02/11/2014   Procedure: CYSTOSCOPY WITH RETROGRADE PYELOGRAM, URETEROSCOPY ,URETERAL BIOPSY AND STENT PLACEMENT;  Surgeon: Gretel Ferrara,  MD;  Location: [facility];  Service: Urology;  Laterality: Right;   CYSTOSCOPY WITH RETROGRADE PYELOGRAM, URETEROSCOPY AND STENT PLACEMENT Right 04/15/2014   Procedure: CYSTOSCOPY WITH RETROGRADE PYELOGRAM, URETEROSCOPY, BIOPSY AND STENT PLACEMENT;  Surgeon: Gretel Ferrara, MD;  Location: [facility];  Service: Urology;  Laterality: Right;   LEFT HEART CATH AND CORONARY ANGIOGRAPHY N/A 03/29/2017   Procedure: LEFT HEART CATH AND CORONARY ANGIOGRAPHY;  Surgeon: Anner Alm ORN, MD;  Location: [facility] IN[facility] CV LAB;  Service: Cardiovascular;  Laterality: N/A;   LEFT HEART CATHETERIZATION WITH CORONARY ANGIOGRAM N/A 06/20/2014    Procedure: LEFT HEART CATHETERIZATION WITH CORONARY ANGIOGRAM;  Surgeon: Alm ORN Anner, MD;  Location: [facility] CATH LAB;  Service: Cardiovascular;  Laterality: N/A;   PACEMAKER IMPLANT N/A 05/21/2019   Procedure: PACEMAKER IMPLANT;  Surgeon: Waddell Danelle ORN, MD;  Location: [facility];  Service: Cardiovascular;  Laterality: N/A;   PERCUTANEOUS CORONARY STENT INTERVENTION (PCI-S)  06/20/2014   Procedure: PERCUTANEOUS CORONARY STENT INTERVENTION (PCI-S);  Surgeon: Alm ORN Anner, MD;  Location: [facility] CATH LAB;  Service: Cardiovascular;;  LAD and Circumflex   REFRACTIVE SURGERY Bilateral 05/31/2002   RIGHT/LEFT HEART CATH AND CORONARY ANGIOGRAPHY N/A 07/08/2020   Procedure: RIGHT/LEFT HEART CATH AND CORONARY ANGIOGRAPHY;  Surgeon: Claudene Victory ORN, MD;  Location: [facility];  Service: Cardiovascular;  Laterality: N/A;   ROBOT ASSISTED LAPAROSCOPIC RADICAL PROSTATECTOMY  05/31/2010   THYROIDECTOMY, PARTIAL  10/30/1974   TONSILLECTOMY  05/31/1954   TOTAL THYROIDECTOMY  10/29/2012   nuked it   UPPER GI ENDOSCOPY     food impaction 2009 done by Dr Celestia   UPPER GI ENDOSCOPY     URINARY SPHINCTER IMPLANT  11/30/2011   Procedure: ARTIFICIAL URINARY SPHINCTER;  Surgeon: Glendia DELENA Elizabeth, MD;  Location: [facility];  Service: Urology;  Laterality: N/A;   Patient Active Problem List   Diagnosis Date Noted   Obsessive-compulsive disorder 11/18/2023   Normocytic anemia 11/18/2023   Morbid obesity (HCC) 11/18/2023   Hypertensive retinopathy 11/18/2023   Mixed hyperlipidemia 11/18/2023   Dilatation of aorta 11/18/2023   Paroxysmal atrial fibrillation (HCC) 11/18/2023   Primary open angle glaucoma (POAG) of both eyes 11/18/2023   Reactive airway disease 11/18/2023   Thoracic aortic aneurysm without rupture 11/18/2023   Thrombophilia 11/18/2023   Vitamin D deficiency 11/18/2023   Aortic atherosclerosis    Abnormal findings on diagnostic imaging of lung 01/30/2020   Malignant neoplasm  metastatic to bone (HCC) 09/27/2019   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) 09/27/2019   Pacemaker 08/20/2019   Sinus node dysfunction (HCC) 05/21/2019   Palpitations 05/07/2019   Bradycardia 01/30/2019   Globus pharyngeus 01/04/2018   Hoarseness 01/04/2018   SOB (shortness of breath) 12/13/2017   Chronic diastolic heart failure (HCC) 06/26/2017   Coronary artery disease due to lipid rich plaque    Pure hypercholesterolemia    Angina pectoris 07/04/2014   Family history of premature CAD 06/20/2014   Dyspnea on exertion 06/20/2014    Class: Diagnosis of   Hydronephrosis of right kidney    Prostate cancer (HCC)    Dilated aortic root 12/17/2013   Hypothyroidism following radioiodine therapy 03/05/2013   Essential hypertension 11/08/2012   Allergic rhinitis 11/08/2012   Depressive disorder, not elsewhere classified 11/08/2012   GERD (gastroesophageal reflux disease) 11/08/2012   Insomnia 11/08/2012    PCP: Okey Carlin Redbird, MD   REFERRING PROVIDER: Cesario Boer, MD  REFERRING DIAG: M54.16 (ICD-10-CM) - Radiculopathy, lumbar region  Rationale for Evaluation and Treatment:  Rehabilitation  THERAPY DIAG:  Other low back pain  Muscle weakness (generalized)  Other symptoms and signs involving the musculoskeletal system  ONSET DATE: 1 year+  SUBJECTIVE:                                                                                                                                                                                           SUBJECTIVE STATEMENT:  Pt reports that the numbness is moving downward. He really enjoyed the DN last time, and the STM with the tennis ball. He thinks it really loosened things up.   Eval: Patient states numbness in R leg and weakness in the leg now. Symptoms are from thigh to mid/lower shin. No change with spinal injections, lyrica doesn't help. Intermittent pain but not constant  but numbness is constant. Hasn't felt normal in months. Has some light  touch feeling. Member at [employer].  PERTINENT HISTORY:  CHF, CAD, HTN, HLD, pacemaker, afib, hx prostate cancer with METS  PAIN:  Are you having pain? Yes: NPRS scale: close to 0/10, but numbness is 8/10 Pain location: R thigh to top of knee cap  Pain description: numb Aggravating factors: constant Relieving factors:    PRECAUTIONS: None and Other: light lifting  WEIGHT BEARING RESTRICTIONS: No  FALLS:  Has patient fallen in last 6 months? Yes. Number of falls 3-4  OCCUPATION: Retired   PLOF: Independent  PATIENT GOALS: strengthen R knee and get cardio back up, going to [place]  in May and would like to get back to walking 1-1.5 miles  OBJECTIVE: (objective measures from initial evaluation unless otherwise dated)  PATIENT SURVEYS:  LEFS  Extreme difficulty/unable (0), Quite a bit of difficulty (1), Moderate difficulty (2), Little difficulty (3), No difficulty (4) Survey date:  06/13/24  Any of your usual work, housework or school activities 2  2. Usual hobbies, recreational or sporting activities 2  3. Getting into/out of the bath 4  4. Walking between rooms 4  5. Putting on socks/shoes 3  6. Squatting  3  7. Lifting an object, like a bag of groceries from the floor 4  8. Performing light activities around your home 3  9. Performing heavy activities around your home 1  10. Getting into/out of a car 2  11. Walking 2 blocks 1  12. Walking 1 mile 0  13. Going up/down 10 stairs (1 flight) 1  14. Standing for 1 hour 2  15.  sitting for 1 hour 4  16. Running on even ground 0  17. Running on uneven ground 0  18. Making sharp turns while running fast 0  19. Hopping  2  20. Rolling over in bed 4  Score total:  42/80     SCREENING FOR RED FLAGS: Bowel or bladder incontinence: No Spinal tumors: No Cauda equina syndrome: No Compression fracture: No Abdominal aneurysm: No  COGNITION: Overall cognitive status: Within functional limits for tasks  assessed     SENSATION: Impaired L3-L4 on RLE   POSTURE: decreased lumbar lordosis and increased thoracic kyphosis  PALPATION: No tenderness grossly  LUMBAR ROM:   AROM eval  Flexion 25% limited  Extension 25% limited (felt good)  Right lateral flexion 0% limited  Left lateral flexion 0% limited  Right rotation 0% limited  Left rotation 0% limited   (Blank rows = not tested) * = pain/symptoms  LOWER EXTREMITY ROM:   WFL for tasks assessed  Active  Right eval Left eval  Hip flexion    Hip extension    Hip abduction    Hip adduction    Hip internal rotation    Hip external rotation    Knee flexion    Knee extension    Ankle dorsiflexion    Ankle plantarflexion    Ankle inversion    Ankle eversion     (Blank rows = not tested) * = pain/symptoms  LOWER EXTREMITY MMT:    MMT Right eval Left eval  Hip flexion 4- 5  Hip extension 3- 3+  Hip abduction 4- 4  Hip adduction    Hip internal rotation    Hip external rotation    Knee flexion 4 5  Knee extension 4 5  Ankle dorsiflexion 4+ 5  Ankle plantarflexion    Ankle inversion    Ankle eversion     (Blank rows = not tested) * = pain/symptoms  FUNCTIONAL TESTS:  5 times sit to stand: 16.18 seconds without UE support, relies LLE>RLE Stairs: 7 inch, tends to use step to pattern, can perform alternating pattern with cueing, increased effort on R stance ascending, decreased eccentric strength on R Femoral nerve tension test: no change in symptoms Repeated lumbar extension: no change in symptoms  GAIT: Distance walked: 100 feet Assistive device utilized: None Level of assistance: Complete Independence Comments:   TODAY'S TREATMENT:                                                                                                                              DATE:  2/5 Pt came in early today and did seated elliptical while waiting for 10.5 mins  There-ex: Supine Bridges 2x10 Leg press 3x10, 50#, 60#, 70# Row  machine 3x10 40# min cueing for posture Tricep press down machine 3x10 50#  Trigger Point Dry Needling  Initial Treatment: Pt instructed on Dry Needling rational, procedures, and possible side effects. Pt instructed to expect mild to moderate muscle soreness later in the day and/or into the next day.  Pt instructed in methods to reduce muscle soreness. Pt instructed to continue prescribed HEP. Because Dry Needling was performed over or adjacent  to a lung field, pt was educated on S/S of pneumothorax and to seek immediate medical attention should they occur.  Patient was educated on signs and symptoms of infection and other risk factors and advised to seek medical attention should they occur.  Patient verbalized understanding of these instructions and education.   Patient Verbal Consent Given: Yes Education Handout Provided: Yes Muscles Treated: lumbar paraspinals right from L3-L5, right gluteal 3x with.75x50 needle  Electrical Stimulation Performed: No Treatment Response/Outcome: great twitch   07/03/24  Nustep L4x6 minutes seat 13 all four extremities   Prone press ups x10  SKTC stretch 5x3 seconds B Supine bridge 1x12  PPT 10x3 second holds  Figure 4 stretches 2x30 seconds B  Forward QL stretch with stool 2x30 seconds  Seated TA set + march x10   Tennis ball STM and MFR to lumbar paraspinals and QL L prone    1/28 There-ex: Nustep 5 mins  Supine bridge 2x10   Manual:  Skilled TP palpation  DN to paraspinals  Grade 3&4 distraction bilat   Trigger Point Dry Needling  Initial Treatment: Pt instructed on Dry Needling rational, procedures, and possible side effects. Pt instructed to expect mild to moderate muscle soreness later in the day and/or into the next day.  Pt instructed in methods to reduce muscle soreness. Pt instructed to continue prescribed HEP. Because Dry Needling was performed over or adjacent to a lung field, pt was educated on S/S of pneumothorax and to  seek immediate medical attention should they occur.  Patient was educated on signs and symptoms of infection and other risk factors and advised to seek medical attention should they occur.  Patient verbalized understanding of these instructions and education.   Patient Verbal Consent Given: Yes Education Handout Provided: Yes Muscles Treated: lumbar paraspinals right from L3-L5, right gluteal 3x with.75x50 needle  Electrical Stimulation Performed: No Treatment Response/Outcome: great twitch     1/22 Manual:  STM to lower back/paraspinals and upper glutes Grade 3&4 distraction bilat  There-ex: Supine bridge 3x10  Supine abd 3x10 GTB  Supine SLR x8 bilat  Pt ed: TrA activation and postural breathing PT provided ed on how loosening up muscles in the back may help with the numbness and tingling if nerve is getting compressed.  06/13/24  Eval, HEP, education   PATIENT EDUCATION:  Education details: Patient educated on exam findings, POC, scope of PT, HEP, relevant anatomy and biomechanics, dosing of exercises. Person educated: Patient Education method: Explanation, Demonstration, and Handouts Education comprehension: verbalized understanding, returned demonstration, verbal cues required, and tactile cues required  HOME EXERCISE PROGRAM: Access Code: [email or web address] URL: [email or web address] Date: 06/13/2024 Prepared by: Prentice Zaunegger  Exercises - Standing Lumbar Extension  - 2-3 x daily - 7 x weekly - 1-2 sets - 10 reps - Supine Bridge  - 1 x daily - 7 x weekly - 2 sets - 10 reps - Seated Long Arc Quad  - 1 x daily - 7 x weekly - 2-3 sets - 10 reps - 5-10 second holds hold  ASSESSMENT:  CLINICAL IMPRESSION:  Pt felt relief following DN and was excited to get into the gym today. Pt tolerated exercises well and was careful not to elevate their HR. PT measured pt HR after 2 machines and found normal findings. PT provided pt with exercise plan to take to the gym  independently. Overall, pt is progressing well.   Eval: Patient a 74 y.o. y.o. male who was seen today for physical therapy evaluation and  treatment for lumbar radiculopathy. Patient presents with pain limited deficits in lumbar spine and LE strength, ROM, endurance, activity tolerance, and functional mobility with ADL. Patient is having to modify and restrict ADL as indicated by outcome measure score as well as subjective information and objective measures which is affecting overall participation. Patient will benefit from skilled physical therapy in order to improve function and reduce impairment.  OBJECTIVE IMPAIRMENTS: Abnormal gait, decreased activity tolerance, decreased balance, decreased endurance, decreased mobility, difficulty walking, decreased ROM, decreased strength, hypomobility, increased muscle spasms, impaired flexibility, improper body mechanics, postural dysfunction, and pain  ACTIVITY LIMITATIONS: carrying, lifting, bending, standing, squatting, stairs, transfers, bed mobility, reach over head, locomotion level, and caring for others  PARTICIPATION LIMITATIONS:  meal prep, cleaning, laundry, shopping, community activity, and yard work  PERSONAL FACTORS: Fitness, Time since onset of injury/illness/exacerbation, and 3+ comorbidities: CHF, CAD, HTN, HLD, afib, hx prostate cancer  are also affecting patient's functional outcome.   REHAB POTENTIAL: Good  CLINICAL DECISION MAKING: Evolving/moderate complexity  EVALUATION COMPLEXITY: Moderate   GOALS: Goals reviewed with patient? Yes  SHORT TERM GOALS: Target date: 07/25/2024    Patient will be independent with HEP in order to improve functional outcomes. Baseline:  Goal status: INITIAL  2.  Patient will report at least 25% improvement in symptoms for improved quality of life. Baseline:  Goal status: INITIAL    LONG TERM GOALS: Target date: 09/05/2024    Patient will report at least 75% improvement in symptoms for  improved quality of life. Baseline:  Goal status: INITIAL  2.  Patient will improve LEFS score by at least 18 points in order to indicate improved tolerance to activity. Baseline:  Goal status: INITIAL  3.  Patient will get back to walking 1-1.5 miles for improved ability to ambulate in [place] .  Baseline:  Goal status: INITIAL  4.  Patient will be able to complete 5x STS in under 11.4 seconds in order to demonstrate improved functional strength. Baseline:  Goal status: INITIAL  5.  Patient will demonstrate grade of 5/5 MMT grade in all tested musculature as evidence of improved strength to assist with stair ambulation and gait.   Baseline:  Goal status: INITIAL     PLAN:  PT FREQUENCY: 1-2x/week  PT DURATION: 12 weeks  PLANNED INTERVENTIONS: [id number]- PT Re-evaluation, 97110-Therapeutic exercises, 97530- Therapeutic activity, [id number]- Neuromuscular re-education, 97535- Self Care, [id number]- Manual therapy, [id number]- Gait training, [phone]- Orthotic Fit/training, [phone]- Canalith repositioning, [id number]- Aquatic Therapy, [phone]- Splinting, [phone]- Wound care (first 20 sq cm), 97598- Wound care (each additional 20 sq cm)Patient/Family education, Balance training, Stair training, Taping, Dry Needling, Joint mobilization, Joint manipulation, Spinal manipulation, Spinal mobilization, Scar mobilization, and DME instructions.  PLAN FOR NEXT SESSION: f/u with HEP, glute and LE strengthening, functional strength, cardio/endurance training, build HEP/gym routine, manual as desired/appropriate   Consider warming up on tred mill on an incline with pt leaned forward UE on rails - can help alleviate sx of stenosis.  I have reviewed and concur with this student's documentation.   Alm JINNY Don, PT 07/06/2024 10:14 AM   During this treatment session, the therapist was present, participating in and directing the treatment.   Stina Gane SPT   "

## 2024-07-06 ENCOUNTER — Other Ambulatory Visit: Payer: Self-pay

## 2024-07-06 NOTE — Telephone Encounter (Signed)
-----   Message from Wilbert Bihari, MD sent at 07/02/2024  4:46 PM EST ----- Essentially unchanged dilated ascending aorta at 4.4cm (4.3cm in 2022).  There were also multiple right lung nodules with 1 enlarging 8mm superior segment RLL nodule -   I am including Dr. Geronimo in on this who just saw patient to notify him since he is following his cough

## 2024-07-06 NOTE — Telephone Encounter (Signed)
 Call to patient to advise of essentially unchanged dilated ascending aorta at 4.4cm (4.3cm in 2022).  There were also multiple right lung nodules with 1 enlarging 8mm superior segment RLL nodule - patient verbalizes he has had this for a long time and is aware of it. He states he will see pulmonology for an appt on 07/18/24. Advised we have forwarded this imaging to his pulmonologist.

## 2024-07-09 ENCOUNTER — Ambulatory Visit (HOSPITAL_BASED_OUTPATIENT_CLINIC_OR_DEPARTMENT_OTHER): Admitting: Physical Therapy

## 2024-07-11 ENCOUNTER — Encounter (HOSPITAL_BASED_OUTPATIENT_CLINIC_OR_DEPARTMENT_OTHER): Admitting: Physical Therapy

## 2024-07-16 ENCOUNTER — Encounter (HOSPITAL_BASED_OUTPATIENT_CLINIC_OR_DEPARTMENT_OTHER): Admitting: Physical Therapy

## 2024-07-18 ENCOUNTER — Ambulatory Visit: Admitting: Primary Care

## 2024-07-18 ENCOUNTER — Encounter (HOSPITAL_BASED_OUTPATIENT_CLINIC_OR_DEPARTMENT_OTHER): Admitting: Physical Therapy

## 2024-07-24 ENCOUNTER — Encounter (HOSPITAL_BASED_OUTPATIENT_CLINIC_OR_DEPARTMENT_OTHER): Admitting: Physical Therapy

## 2024-07-25 ENCOUNTER — Ambulatory Visit (HOSPITAL_BASED_OUTPATIENT_CLINIC_OR_DEPARTMENT_OTHER)

## 2024-07-25 ENCOUNTER — Encounter (HOSPITAL_BASED_OUTPATIENT_CLINIC_OR_DEPARTMENT_OTHER)

## 2024-07-26 ENCOUNTER — Encounter (HOSPITAL_BASED_OUTPATIENT_CLINIC_OR_DEPARTMENT_OTHER): Admitting: Physical Therapy

## 2024-07-31 ENCOUNTER — Encounter (HOSPITAL_BASED_OUTPATIENT_CLINIC_OR_DEPARTMENT_OTHER): Admitting: Physical Therapy

## 2024-08-02 ENCOUNTER — Encounter (HOSPITAL_BASED_OUTPATIENT_CLINIC_OR_DEPARTMENT_OTHER): Admitting: Physical Therapy

## 2024-08-07 ENCOUNTER — Encounter (HOSPITAL_BASED_OUTPATIENT_CLINIC_OR_DEPARTMENT_OTHER): Admitting: Physical Therapy

## 2024-08-09 ENCOUNTER — Encounter (HOSPITAL_BASED_OUTPATIENT_CLINIC_OR_DEPARTMENT_OTHER): Admitting: Physical Therapy

## 2024-08-14 ENCOUNTER — Encounter (HOSPITAL_BASED_OUTPATIENT_CLINIC_OR_DEPARTMENT_OTHER): Admitting: Physical Therapy

## 2024-08-16 ENCOUNTER — Encounter (HOSPITAL_BASED_OUTPATIENT_CLINIC_OR_DEPARTMENT_OTHER): Admitting: Physical Therapy

## 2024-08-21 ENCOUNTER — Encounter (HOSPITAL_BASED_OUTPATIENT_CLINIC_OR_DEPARTMENT_OTHER): Admitting: Physical Therapy

## 2024-08-23 ENCOUNTER — Encounter (HOSPITAL_BASED_OUTPATIENT_CLINIC_OR_DEPARTMENT_OTHER): Admitting: Physical Therapy

## 2024-08-28 ENCOUNTER — Encounter (HOSPITAL_BASED_OUTPATIENT_CLINIC_OR_DEPARTMENT_OTHER): Admitting: Physical Therapy

## 2024-08-30 ENCOUNTER — Encounter (HOSPITAL_BASED_OUTPATIENT_CLINIC_OR_DEPARTMENT_OTHER): Admitting: Physical Therapy

## 2024-09-04 ENCOUNTER — Encounter (HOSPITAL_BASED_OUTPATIENT_CLINIC_OR_DEPARTMENT_OTHER): Admitting: Physical Therapy

## 2024-09-06 ENCOUNTER — Encounter (HOSPITAL_BASED_OUTPATIENT_CLINIC_OR_DEPARTMENT_OTHER): Admitting: Physical Therapy

## 2024-09-07 ENCOUNTER — Ambulatory Visit (HOSPITAL_BASED_OUTPATIENT_CLINIC_OR_DEPARTMENT_OTHER): Admitting: Family

## 2024-09-07 ENCOUNTER — Ambulatory Visit: Admitting: Neurology

## 2025-01-14 ENCOUNTER — Ambulatory Visit: Admitting: Adult Health
